# Patient Record
Sex: Female | Born: 1950 | Race: White | Hispanic: No | Smoking: Never smoker
Health system: Southern US, Community
[De-identification: ages and names within clinical notes are randomized; demographics above are authoritative.]

## PROBLEM LIST (undated history)

## (undated) DIAGNOSIS — I2510 Atherosclerotic heart disease of native coronary artery without angina pectoris: Secondary | ICD-10-CM

## (undated) DIAGNOSIS — E119 Type 2 diabetes mellitus without complications: Secondary | ICD-10-CM

## (undated) DIAGNOSIS — C801 Malignant (primary) neoplasm, unspecified: Secondary | ICD-10-CM

## (undated) DIAGNOSIS — I10 Essential (primary) hypertension: Secondary | ICD-10-CM

## (undated) DIAGNOSIS — I639 Cerebral infarction, unspecified: Secondary | ICD-10-CM

## (undated) HISTORY — PX: ABDOMINAL HYSTERECTOMY: SHX81

---

## 2006-08-04 ENCOUNTER — Emergency Department: Payer: Self-pay | Admitting: Emergency Medicine

## 2006-08-04 IMAGING — CR DG ELBOW COMPLETE 3+V*L*
1 series · 4 of 4 positions shown · non-contrast
Comparison: none

REASON FOR EXAM: Injury
COMMENTS:

PROCEDURE:     DXR - DXR ELBOW LT COMP W/OBLIQUES  - [DATE]  [DATE]
RESULT:     Multiple views reveal no acute fractures or dislocations. The
joint space is intact.

[Series 1: view not recorded · 0.17mm/px · 4 of 4 slices shown]
[im 1/4]
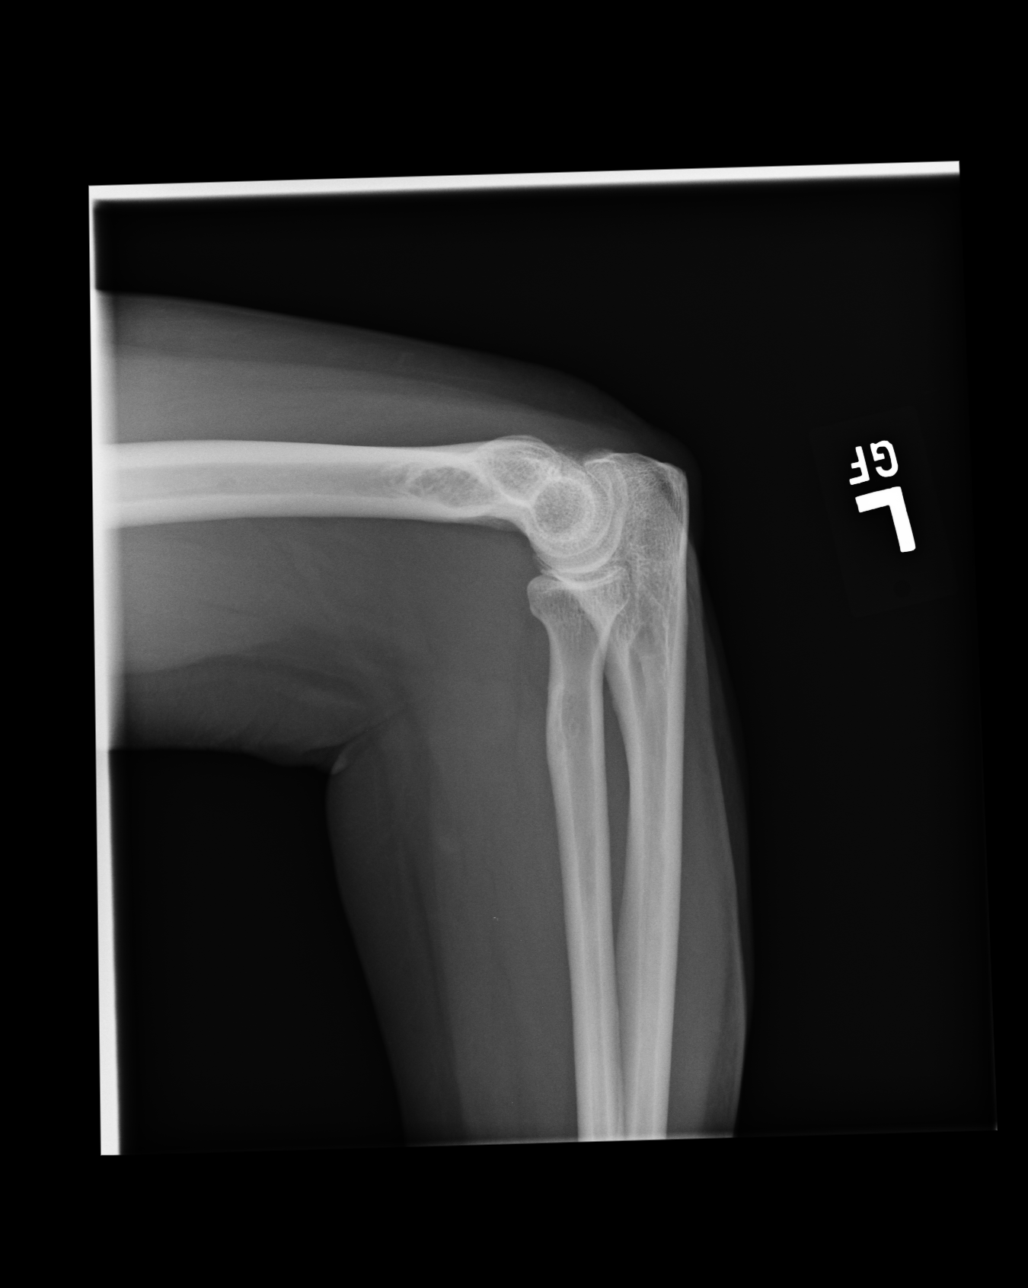
[im 2/4]
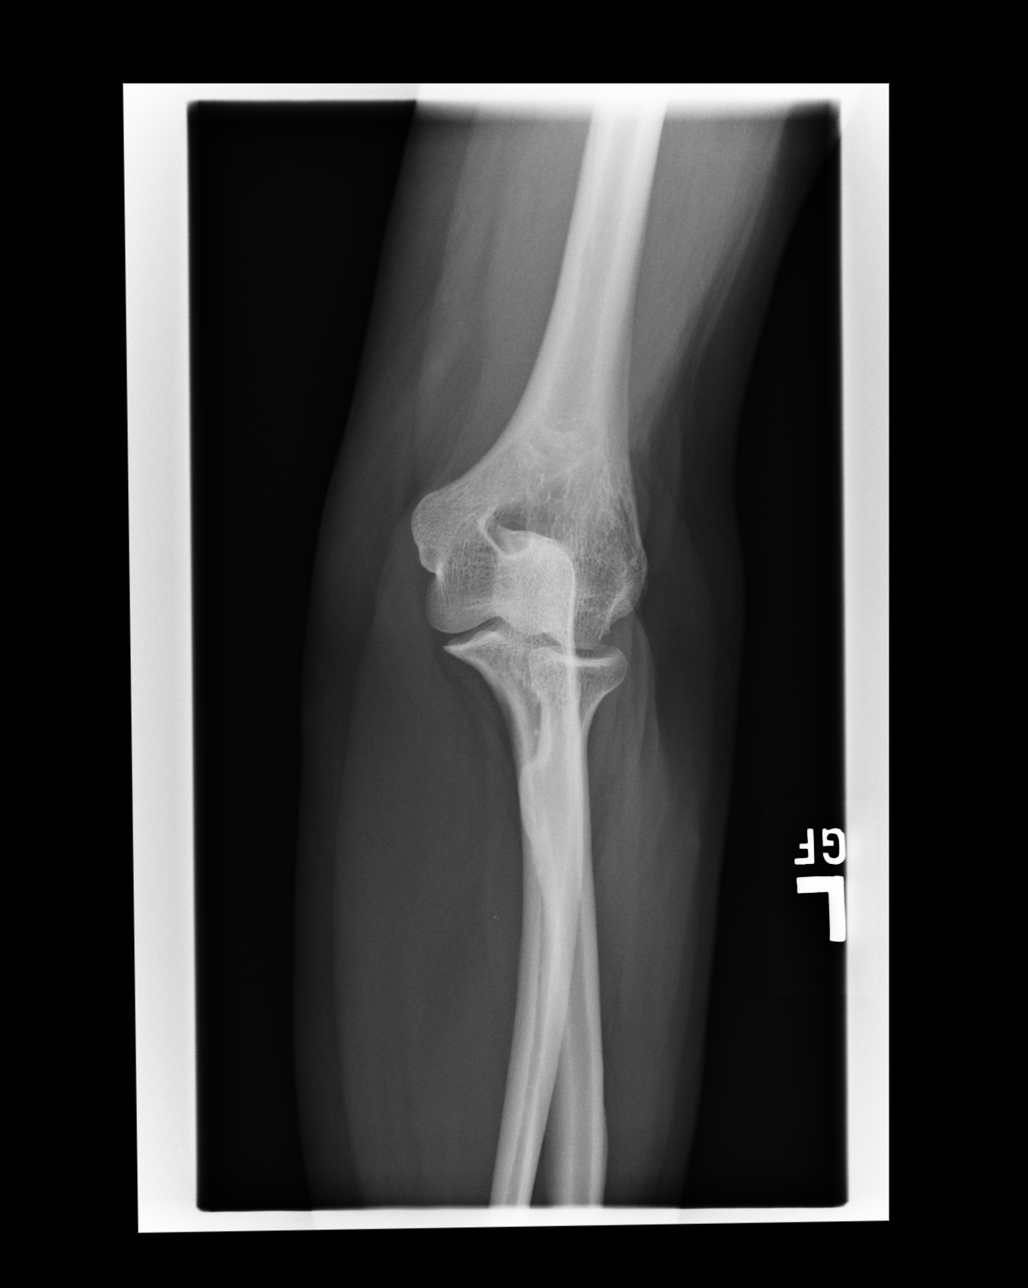
[im 3/4]
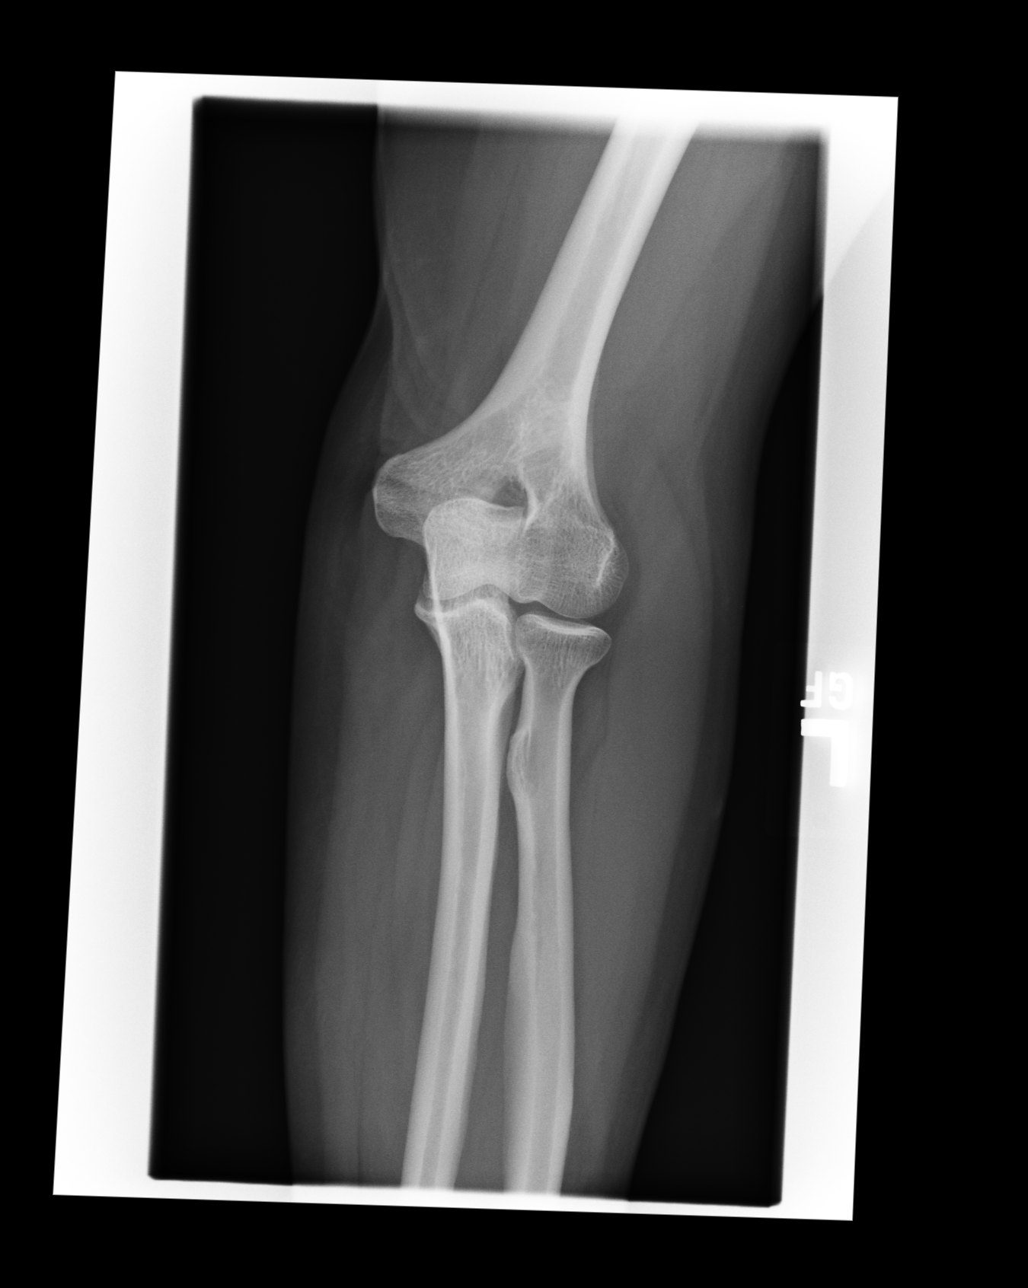
[im 4/4]
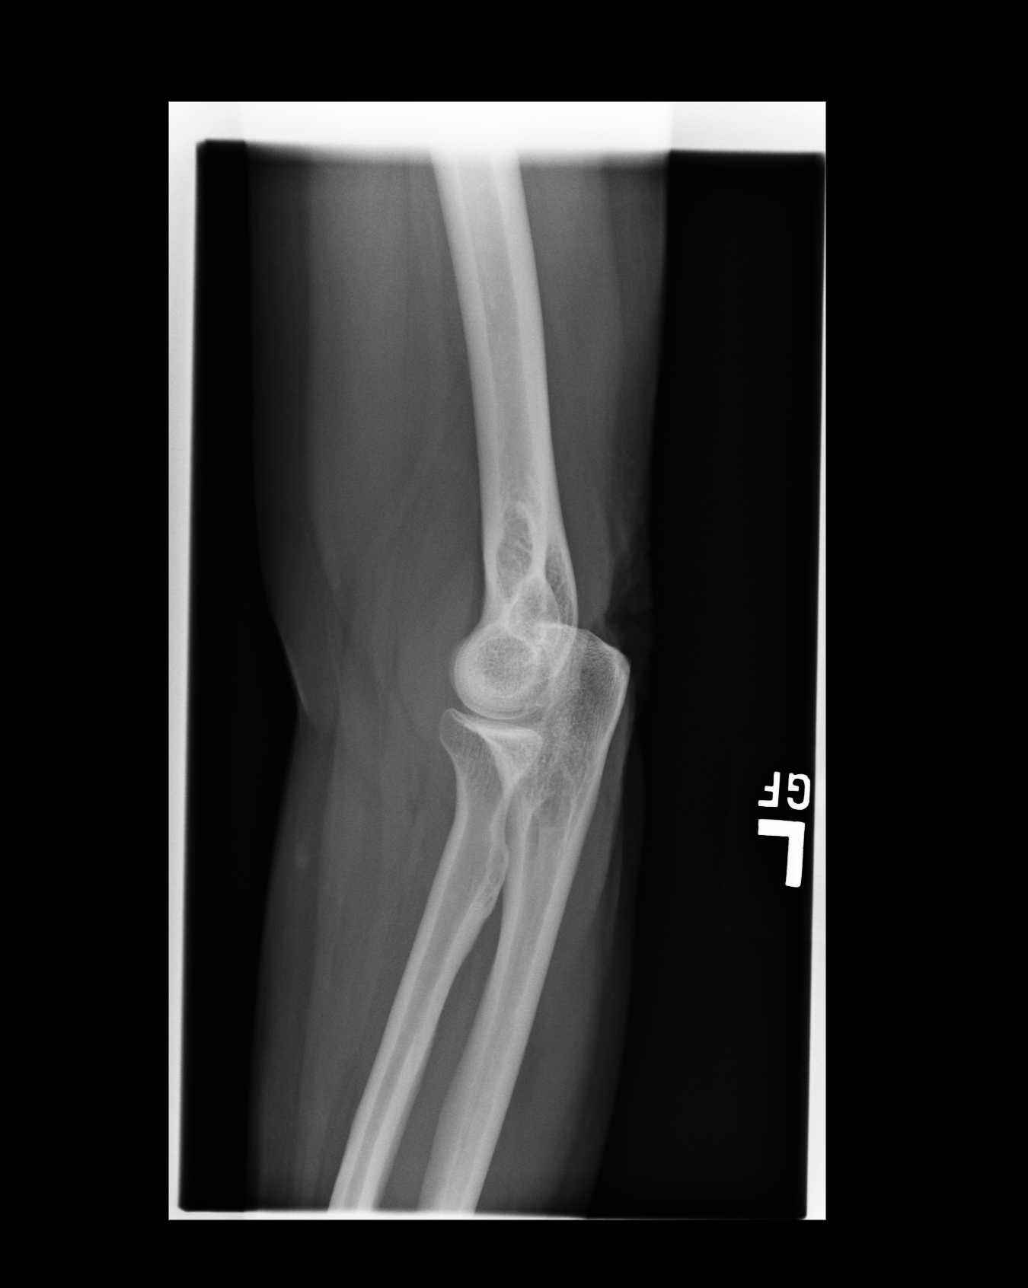

[4 of 4 positions shown; findings below may reference images not displayed]

IMPRESSION: No acute fracture is seen of the LEFT elbow.

## 2008-01-09 ENCOUNTER — Ambulatory Visit: Payer: Self-pay

## 2009-03-18 ENCOUNTER — Ambulatory Visit: Payer: Self-pay

## 2009-03-18 IMAGING — CR RIGHT HIP - COMPLETE 2+ VIEW
1 series · 2 of 2 positions shown · non-contrast
Comparison: none

REASON FOR EXAM: hip problems DDS DR RAZAZ  fax [REDACTED]
COMMENTS:

PROCEDURE:     DXR - DXR HIP RIGHT COMPLETE  - [DATE] [DATE]
RESULT:     Images of the right hip demonstrate no fracture, dislocation or
radiopaque foreign body.

[Series 1: view not recorded · 0.17mm/px · 2 of 2 slices shown]
[im 1/2]
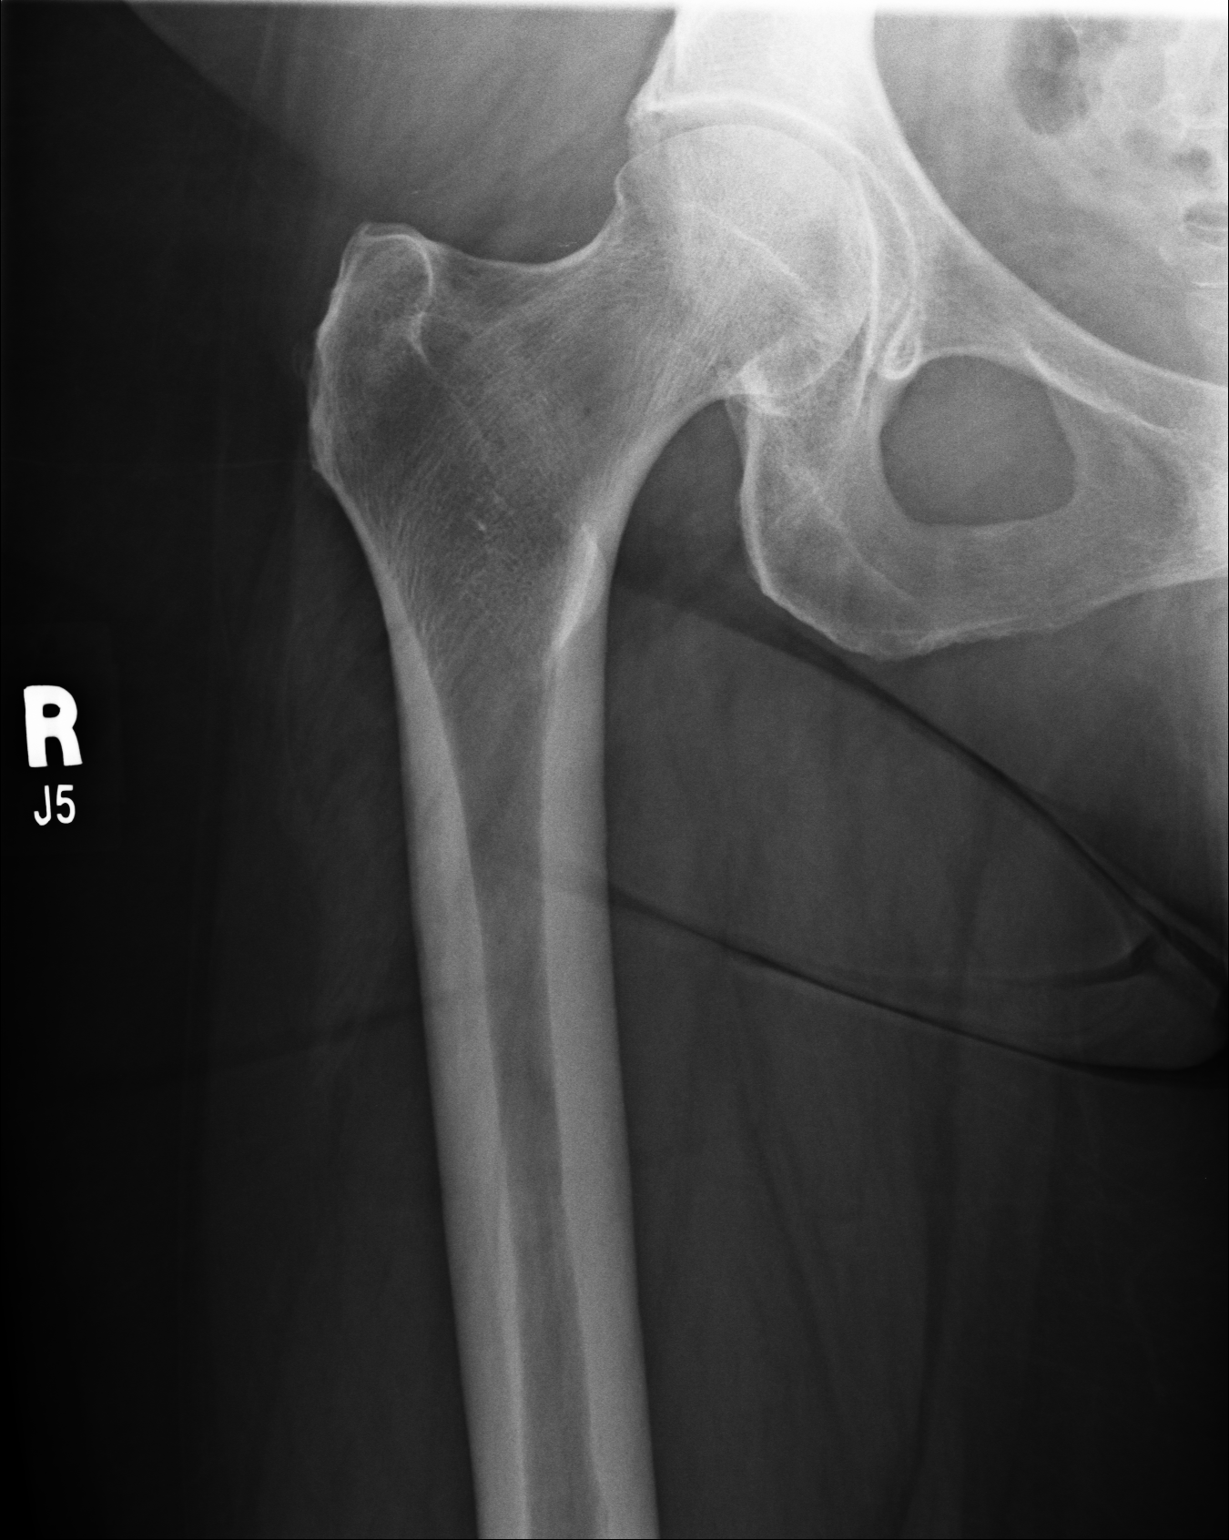
[im 2/2]
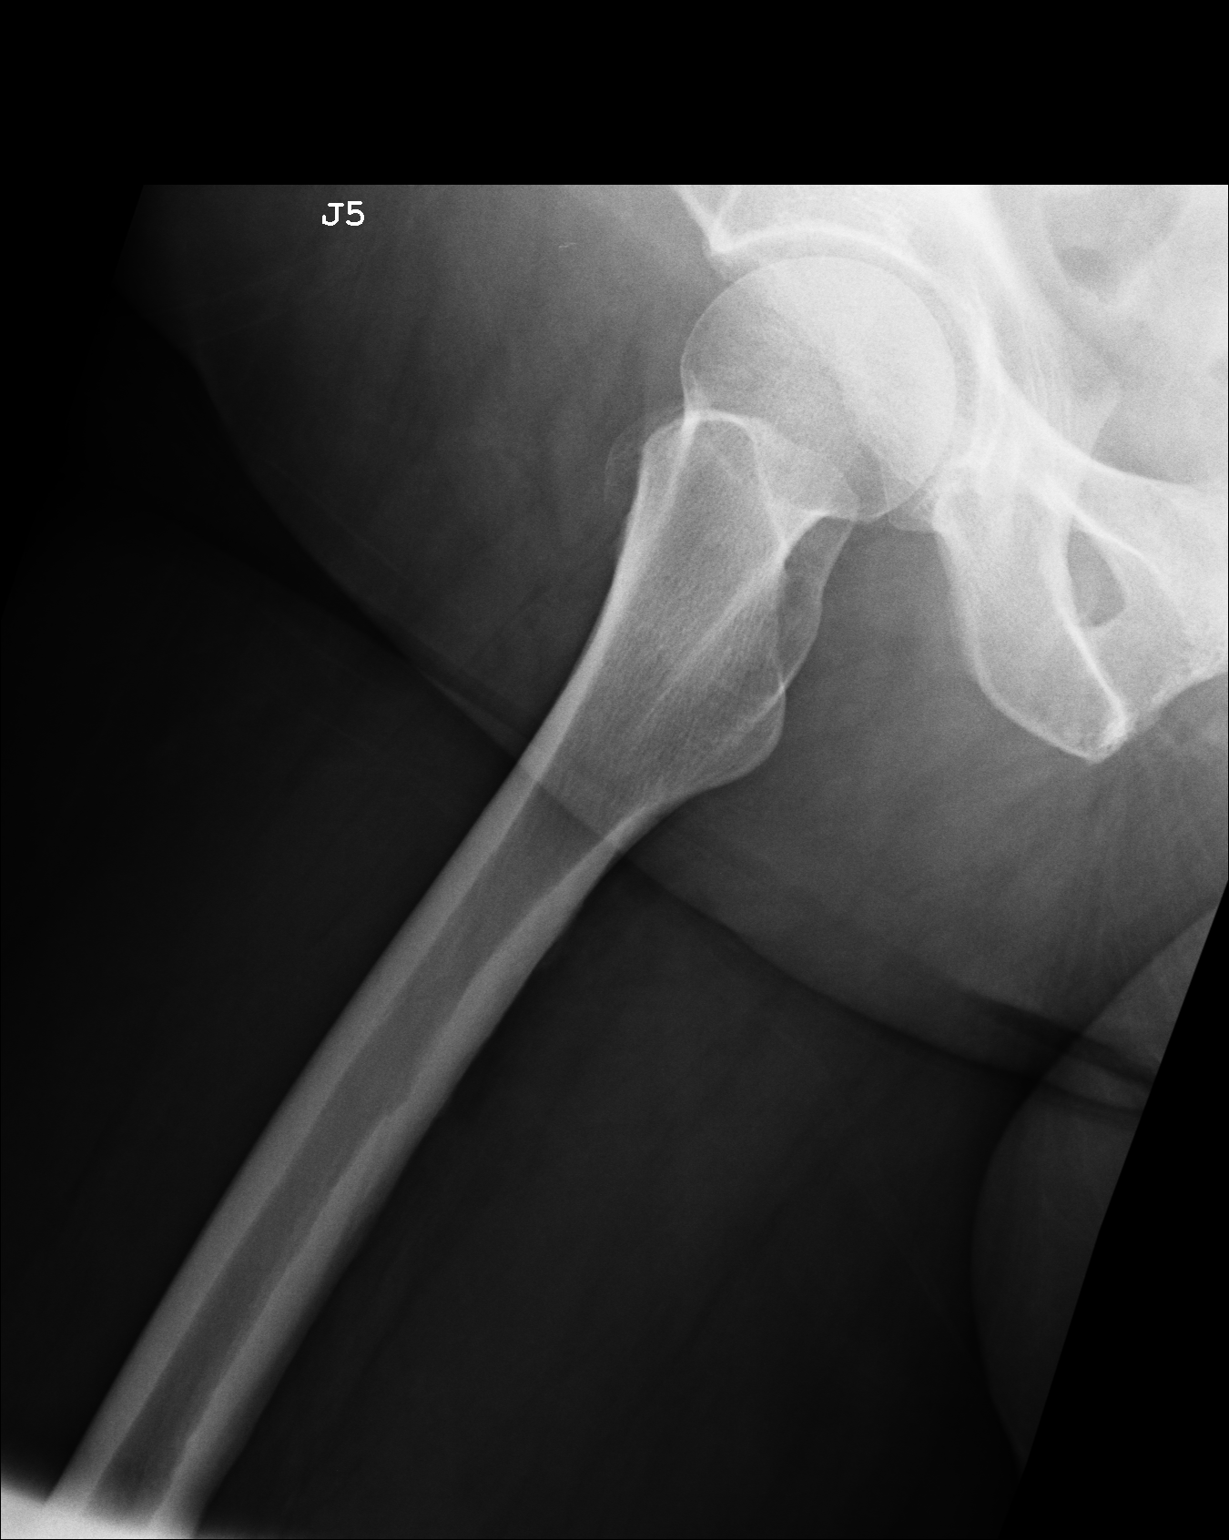

[2 of 2 positions shown; findings below may reference images not displayed]

IMPRESSION: Please see above.

## 2010-02-11 ENCOUNTER — Emergency Department: Payer: Self-pay | Admitting: Unknown Physician Specialty

## 2010-02-11 IMAGING — CR DG CHEST 2V
1 series · 2 of 2 positions shown · non-contrast
Comparison: none

REASON FOR EXAM: right chest pain
COMMENTS:   May transport without cardiac monitor

[Series 1: view not recorded · 0.17mm/px · 2 of 2 slices shown]
[im 1/2]
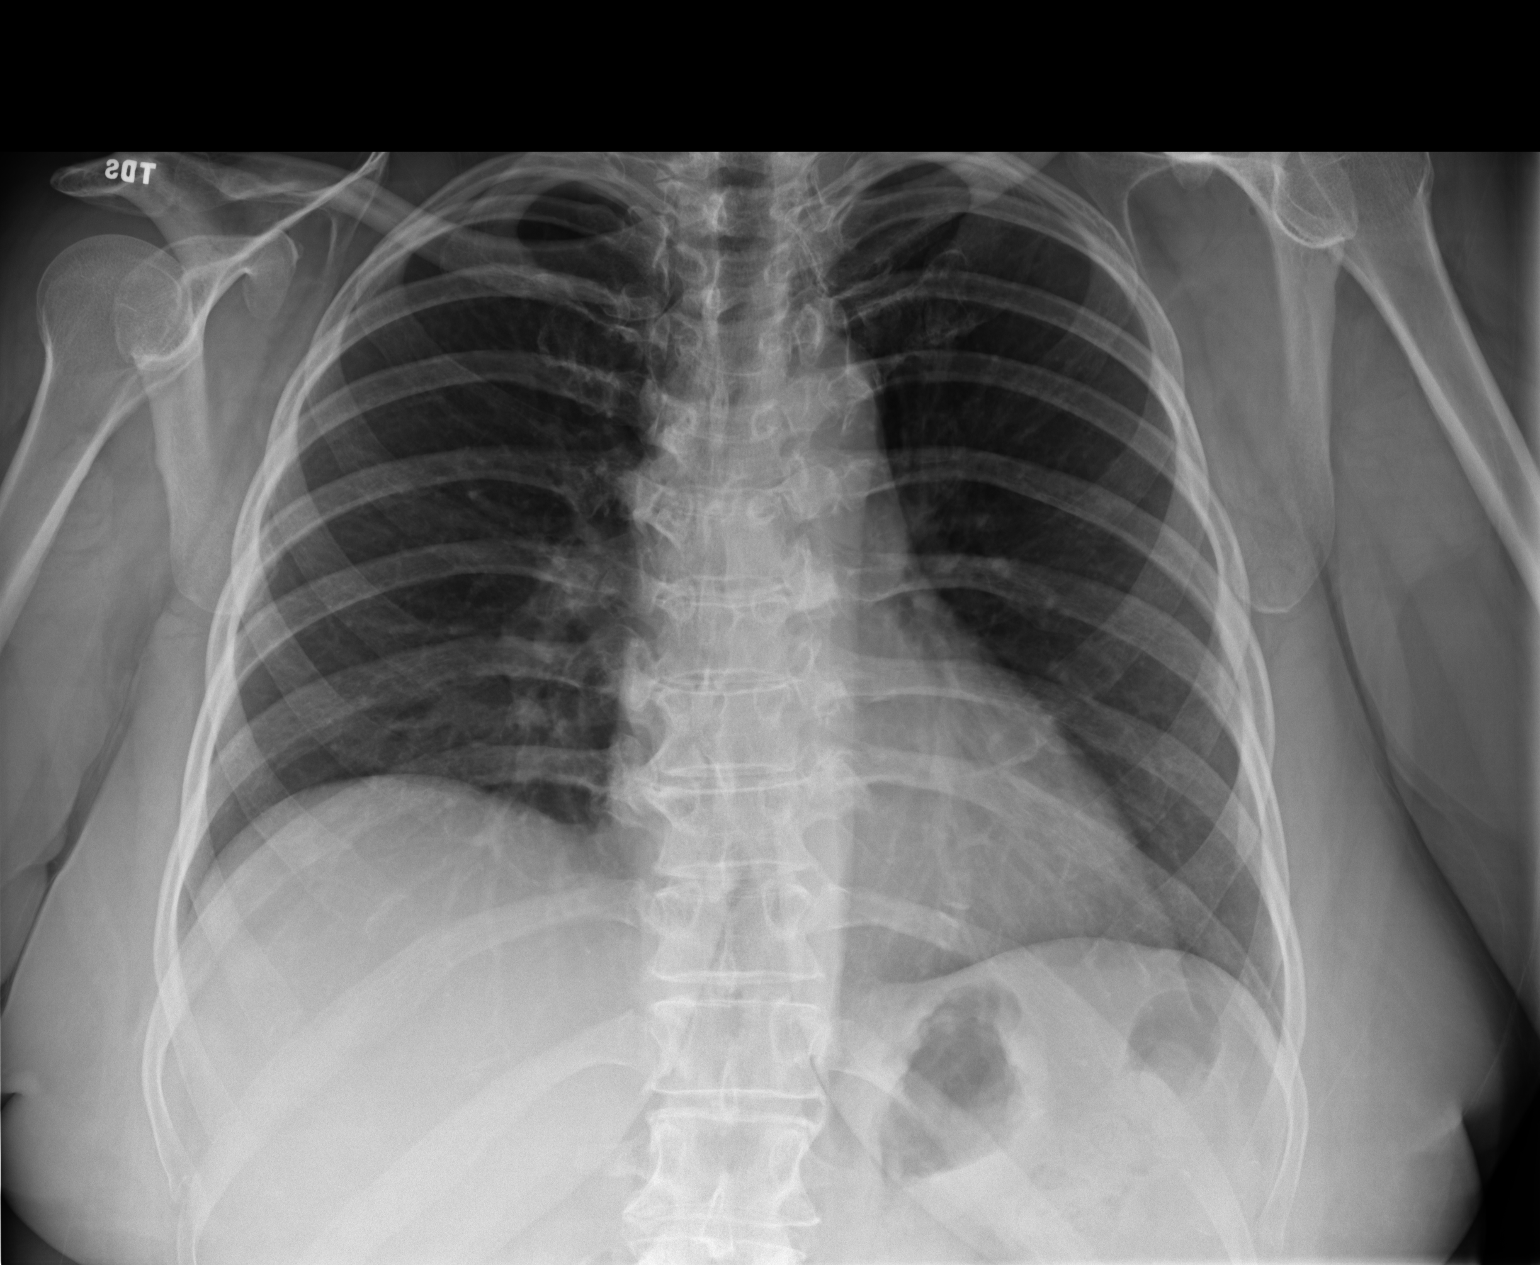
[im 2/2]
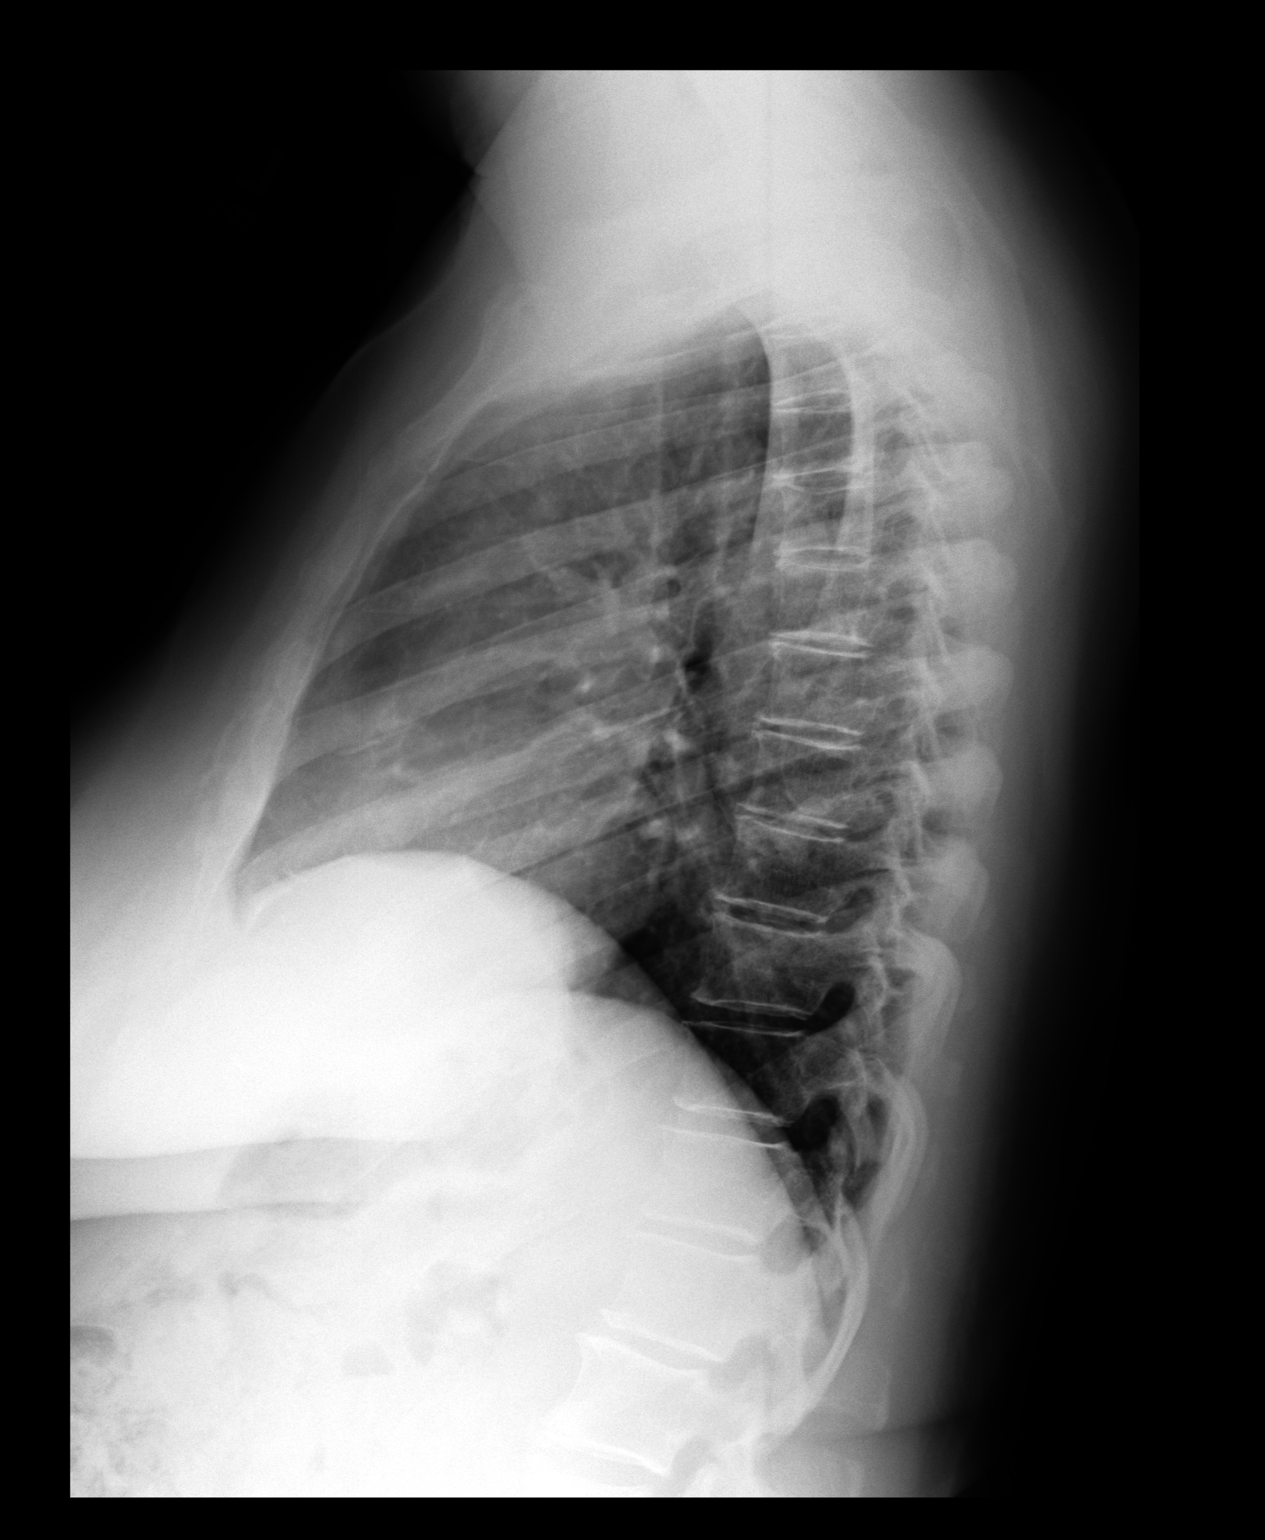

[2 of 2 positions shown; findings below may reference images not displayed]

PROCEDURE:     DXR - DXR CHEST PA (OR AP) AND LATERAL  - [DATE]  [DATE]

RESULT:     The lungs are adequately inflated. There is no focal infiltrate.
The cardiac silhouette is normal in size. The pulmonary vascularity is not
engorged. There is no pleural effusion. The trachea is midline. The
mediastinum is not widened. The observed portions of the thoracic spine
appear normal.
IMPRESSION: I do not see evidence of acute cardiopulmonary abnormality.

## 2010-11-13 ENCOUNTER — Emergency Department: Payer: Self-pay | Admitting: Emergency Medicine

## 2010-11-13 IMAGING — CR RIGHT FOOT COMPLETE - 3+ VIEW
1 series · 3 of 3 positions shown · non-contrast
Comparison: none

REASON FOR EXAM: pain
COMMENTS:

PROCEDURE:     DXR - DXR FOOT RT COMPLETE W/OBLIQUES  - [DATE]  [DATE]
RESULT:     Comparison: None.

[Series 1: view not recorded · 0.17mm/px · 3 of 3 slices shown]
[im 1/3]
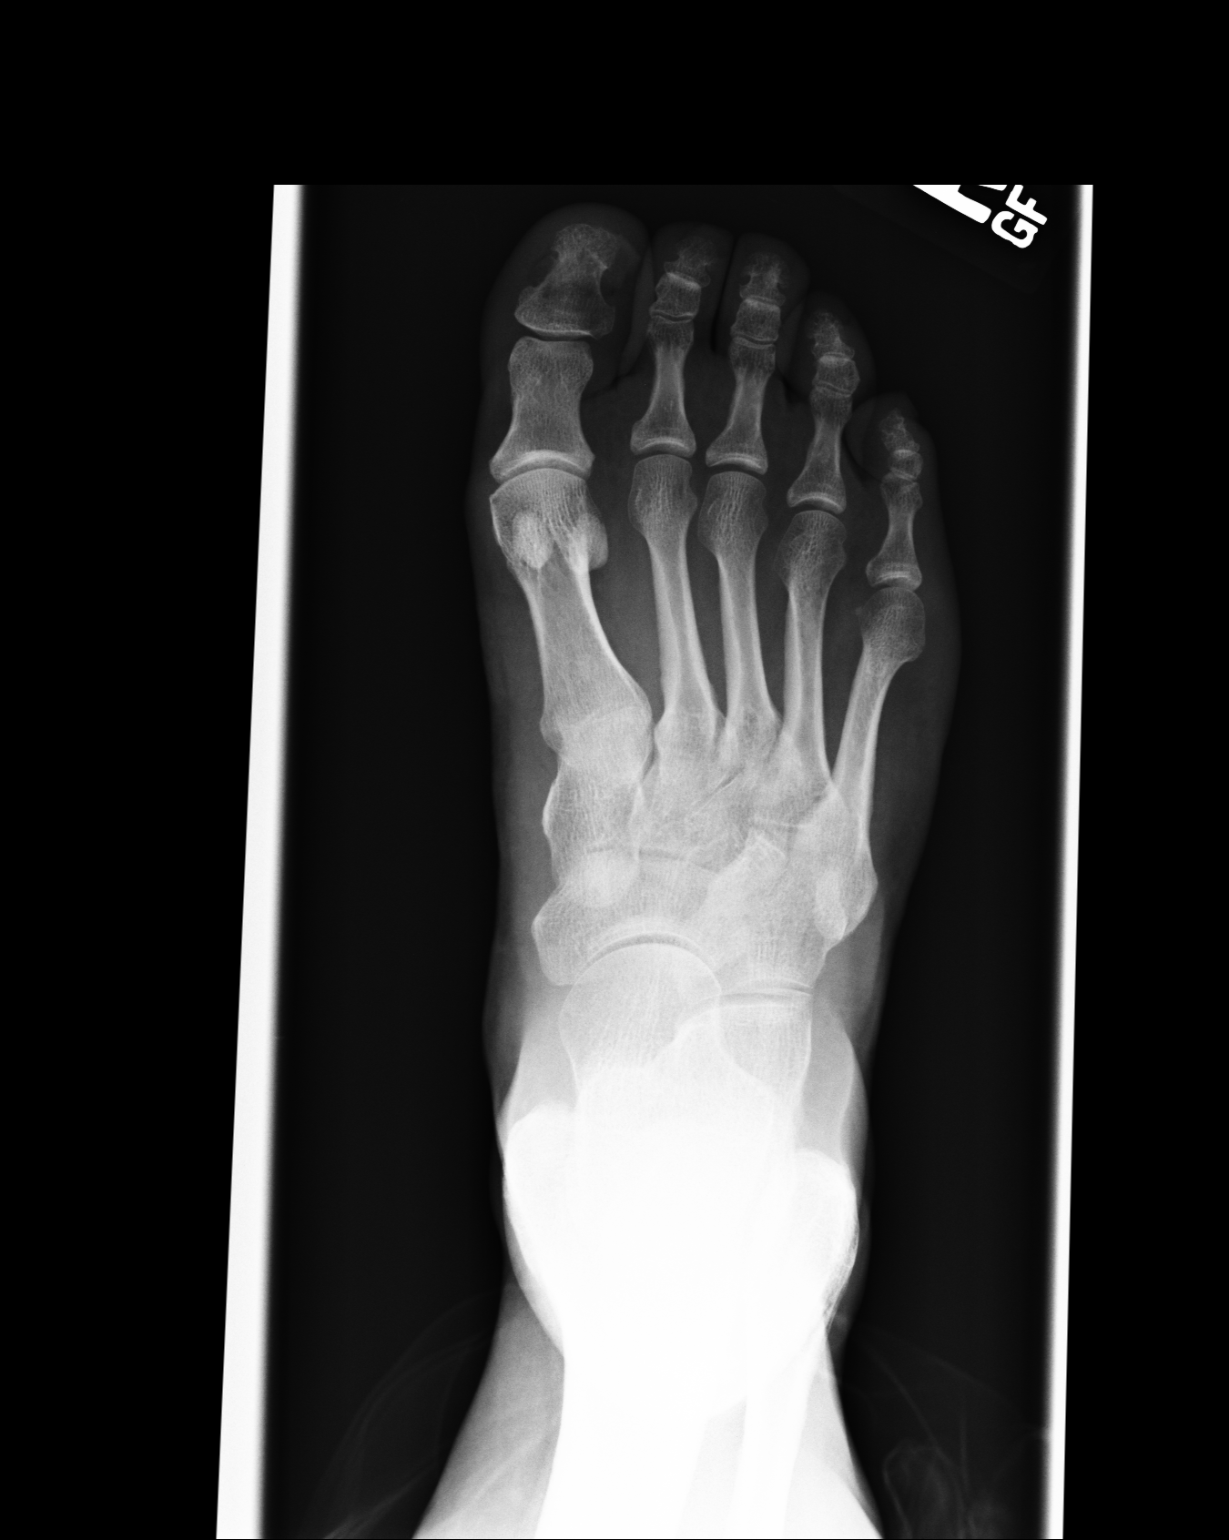
[im 2/3]
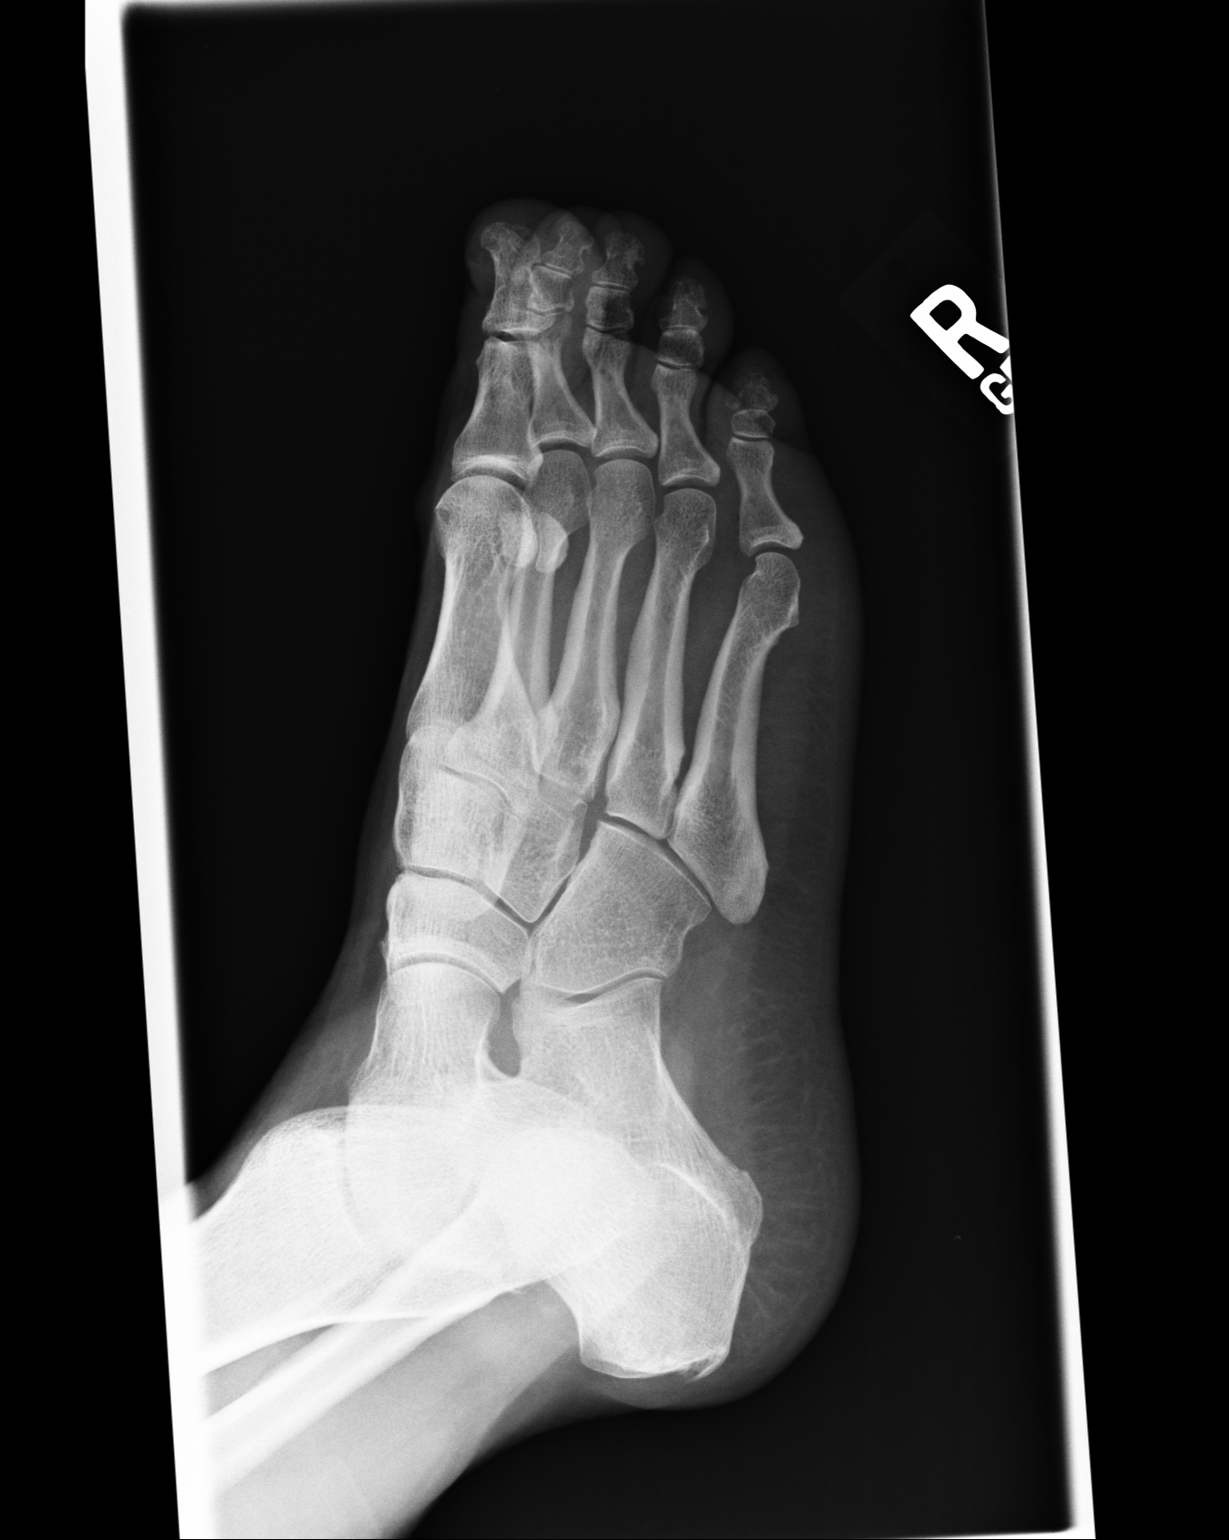
[im 3/3]
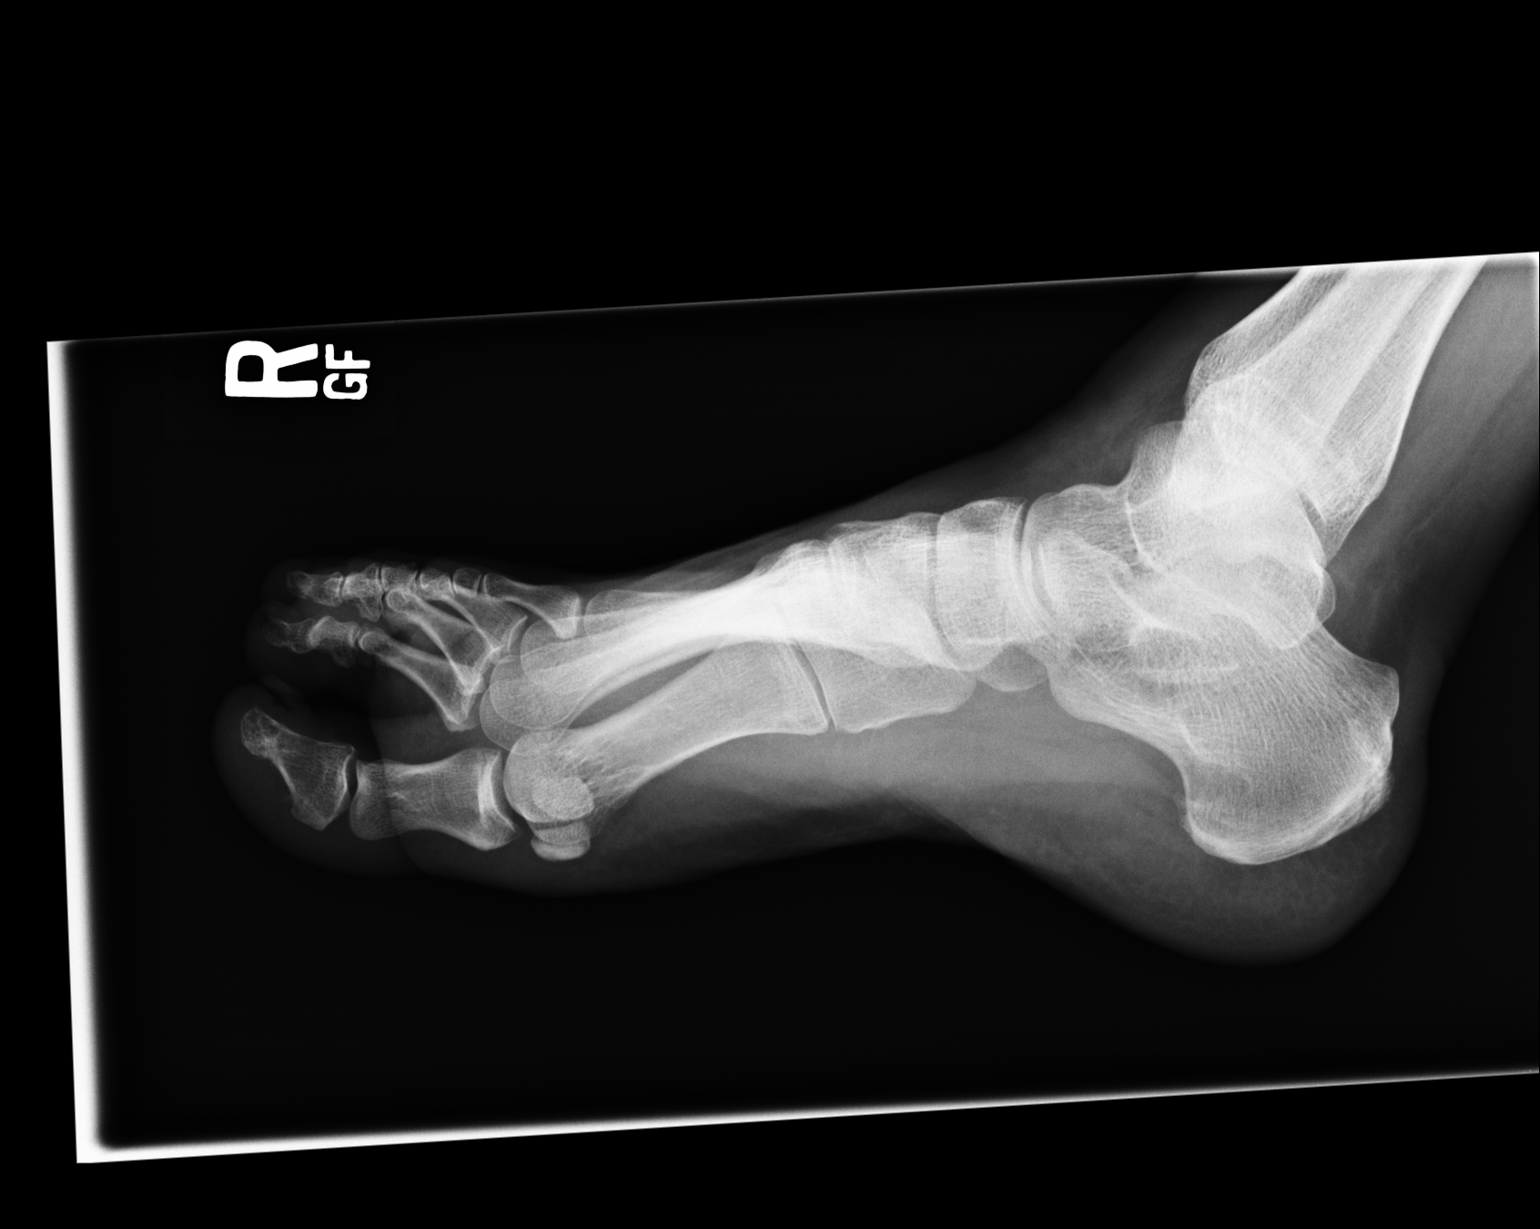

[3 of 3 positions shown; findings below may reference images not displayed]

FINDINGS: No acute fracture. Normal alignment. Joint spaces are maintained.
IMPRESSION: No acute findings.

## 2010-12-11 ENCOUNTER — Emergency Department: Payer: Self-pay | Admitting: Emergency Medicine

## 2010-12-22 ENCOUNTER — Ambulatory Visit: Payer: Self-pay | Admitting: Family Medicine

## 2010-12-22 IMAGING — NM NUCLEAR MEDICINE THREE PHASE BONE SCAN
3 series · 12 of 12 positions shown · non-contrast
Comparison: none

REASON FOR EXAM: right foot injury attn fifth metatarsal
COMMENTS:

[Series 1000: immediate · 4.80mm/px · 2 of 2 frames shown]
[frame 1/2]
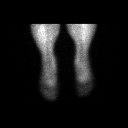
[frame 2/2]
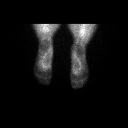

[Series 1000: flow · 4.80mm/px · 6 of 60 frames shown]
[frame 6/60  full-range]
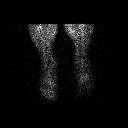
[frame 16/60  full-range]
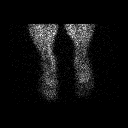
[frame 26/60  full-range]
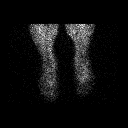
[frame 36/60  full-range]
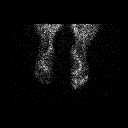
[frame 46/60  full-range]
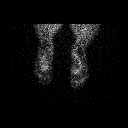
[frame 56/60  full-range]
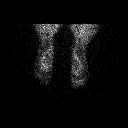

[Series 1000: delays · 4.80mm/px · 2 acquisitions, 4 frames shown]
[im 1/2]
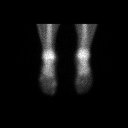
[im 1/2]
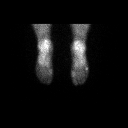
[im 2/2]
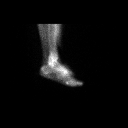
[im 2/2]
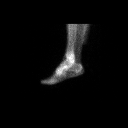

[12 of 12 positions shown; findings below may reference images not displayed]

PROCEDURE:     KNM - KNM 3 PHASE BONE IMAGING STUDY  - [DATE]  [DATE]

RESULT:     Following intravenous administration of 24.0 mCi technetium 99m
MDP, Three Phase Bone Scan of the feet was performed. On the early vascular
phase, in the equilibrium phase views, no abnormal tracer activity is seen
in the feet. Specifically, no abnormal tracer activity is seen in the region
of the right, fifth metatarsal.

On the delayed scan at 3 hours, there is a slight increase in tracer
activity at the level of the right, fifth MP joint. In view of the absence
of hyperemia on the early vascular and equilibrium phase views, this finding
is unlikely related to infection or to acute fracture or to stress fracture.
Arthritic change would be the primary consideration in the etiology at this
point.
IMPRESSION: On the delayed scan there is a tiny focus of increased
tracer activity in the region of the distal right, fifth metatarsal. The
finding is not specific but does not made the criteria for infection, acute
fracture or stress fracture. Arthritic change would be the first
consideration although note is made that review of the right foot radiograph
of [DATE] does not reveal arthritic change at this slight. The etiology
is; therefore, indeterminate by this study.

## 2015-02-02 ENCOUNTER — Ambulatory Visit: Payer: Self-pay | Admitting: Family Medicine

## 2015-08-09 DIAGNOSIS — I10 Essential (primary) hypertension: Secondary | ICD-10-CM | POA: Diagnosis present

## 2016-11-06 ENCOUNTER — Other Ambulatory Visit: Payer: Self-pay | Admitting: Family Medicine

## 2016-11-06 DIAGNOSIS — Z1239 Encounter for other screening for malignant neoplasm of breast: Secondary | ICD-10-CM

## 2017-09-13 ENCOUNTER — Encounter: Payer: Self-pay | Admitting: Emergency Medicine

## 2017-09-13 ENCOUNTER — Emergency Department
Admission: EM | Admit: 2017-09-13 | Discharge: 2017-09-13 | Disposition: A | Discharge status: Home / Self Care | Payer: Medicare Other | Attending: Emergency Medicine | Admitting: Emergency Medicine

## 2017-09-13 ENCOUNTER — Emergency Department: Payer: Medicare Other

## 2017-09-13 DIAGNOSIS — I2510 Atherosclerotic heart disease of native coronary artery without angina pectoris: Secondary | ICD-10-CM | POA: Diagnosis not present

## 2017-09-13 DIAGNOSIS — Z8673 Personal history of transient ischemic attack (TIA), and cerebral infarction without residual deficits: Secondary | ICD-10-CM | POA: Insufficient documentation

## 2017-09-13 DIAGNOSIS — E119 Type 2 diabetes mellitus without complications: Secondary | ICD-10-CM | POA: Diagnosis not present

## 2017-09-13 DIAGNOSIS — G4459 Other complicated headache syndrome: Secondary | ICD-10-CM | POA: Diagnosis not present

## 2017-09-13 DIAGNOSIS — I10 Essential (primary) hypertension: Secondary | ICD-10-CM | POA: Diagnosis not present

## 2017-09-13 DIAGNOSIS — R51 Headache: Secondary | ICD-10-CM | POA: Diagnosis present

## 2017-09-13 HISTORY — DX: Atherosclerotic heart disease of native coronary artery without angina pectoris: I25.10

## 2017-09-13 HISTORY — DX: Malignant (primary) neoplasm, unspecified: C80.1

## 2017-09-13 HISTORY — DX: Essential (primary) hypertension: I10

## 2017-09-13 HISTORY — DX: Cerebral infarction, unspecified: I63.9

## 2017-09-13 HISTORY — DX: Type 2 diabetes mellitus without complications: E11.9

## 2017-09-13 IMAGING — CT CT HEAD W/O CM
3 series · 16 of 44 positions shown, 19 images · non-contrast
Comparison: None.

CLINICAL DATA: 65 y/o  F; acute severe headache.

EXAM:
CT HEAD WITHOUT CONTRAST
TECHNIQUE: Contiguous axial images were obtained from the base of the skull
through the vertex without intravenous contrast.

[Series 3: head wo · axial · 0.40mm/px · z∈[-82,+28]mm · 10 of 27 slices shown, 13 images]
[im 3/27  brain]
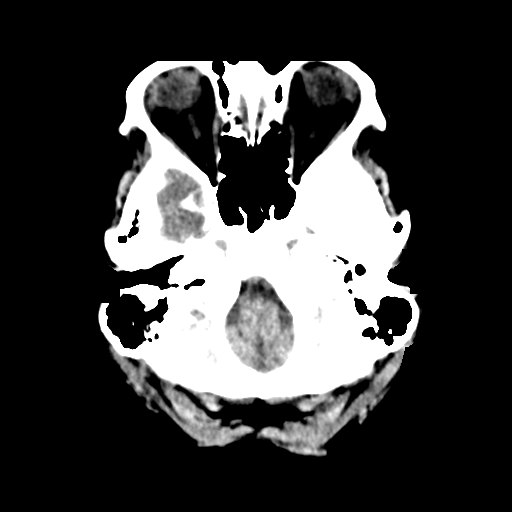
[im 3/27  bone]
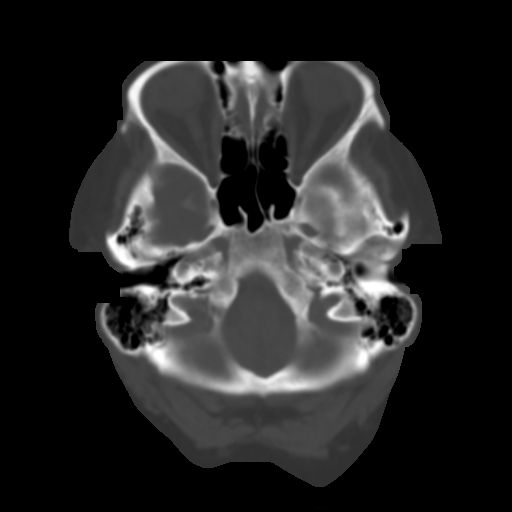
[im 5/27  brain]
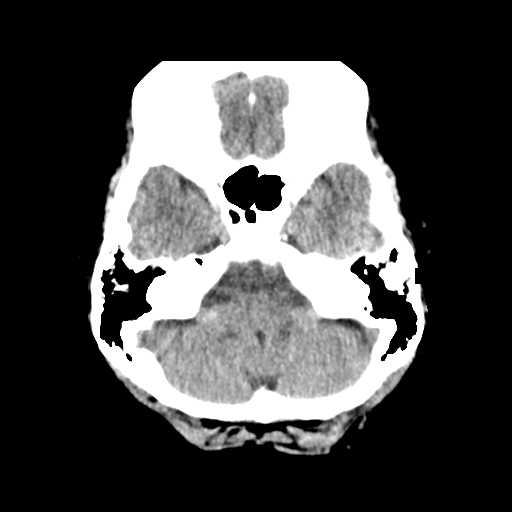
[im 8/27  brain]
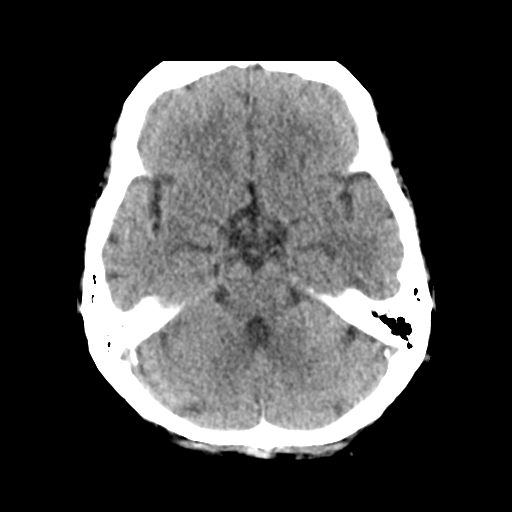
[im 10/27  brain]
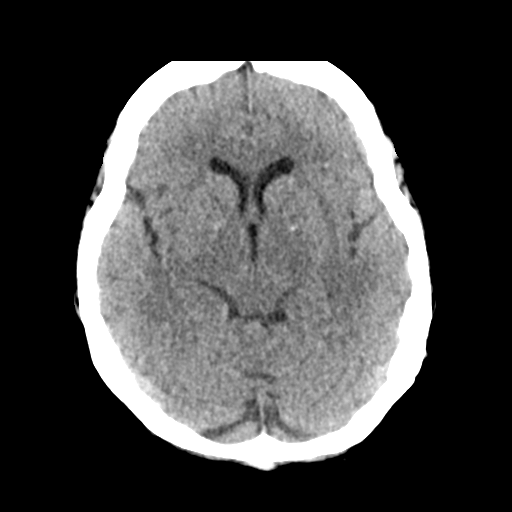
[im 13/27  brain]
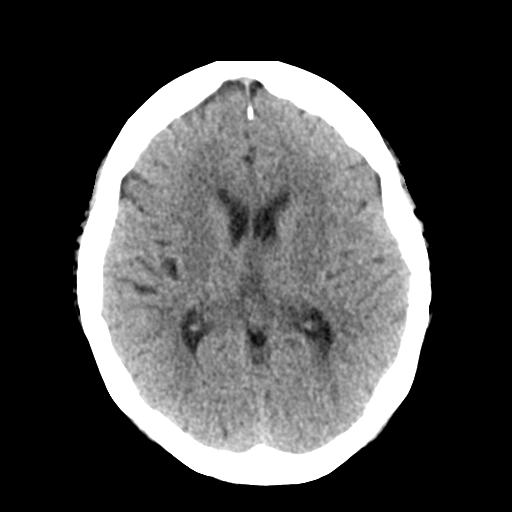
[im 13/27  bone]
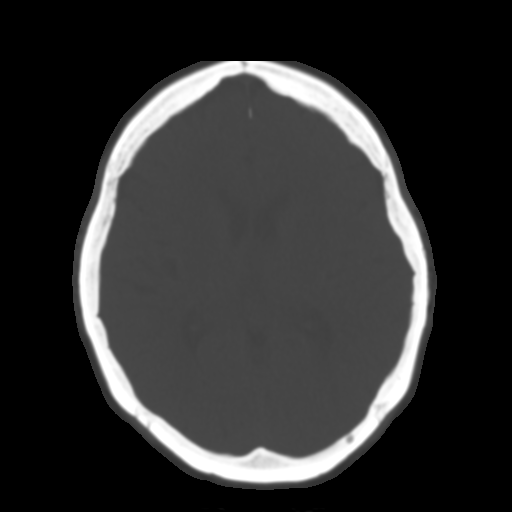
[im 15/27  brain]
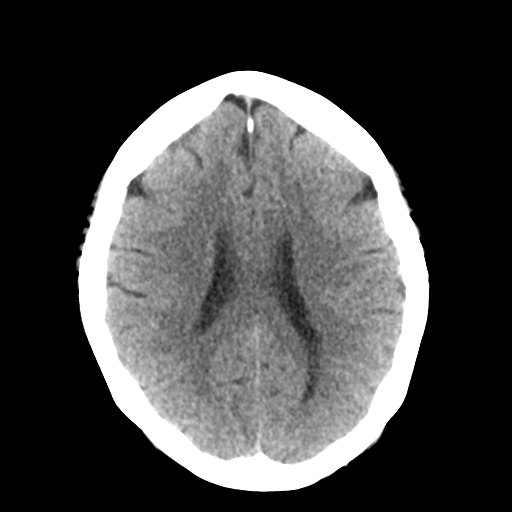
[im 18/27  brain]
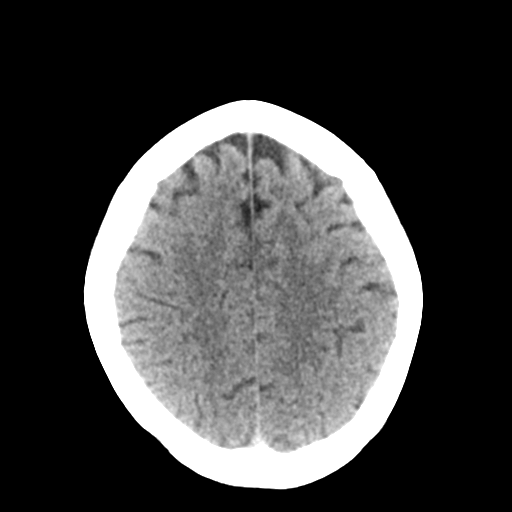
[im 20/27  brain]
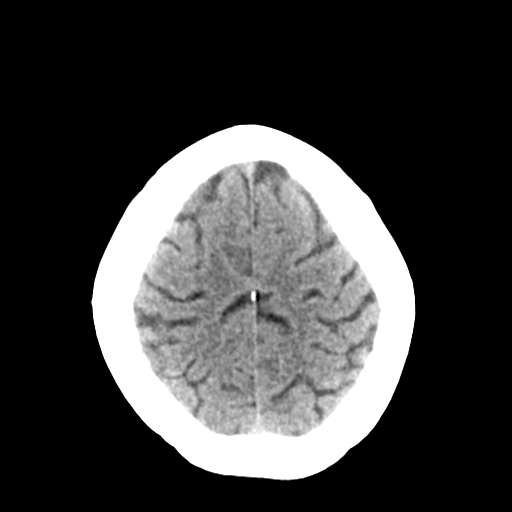
[im 23/27  brain]
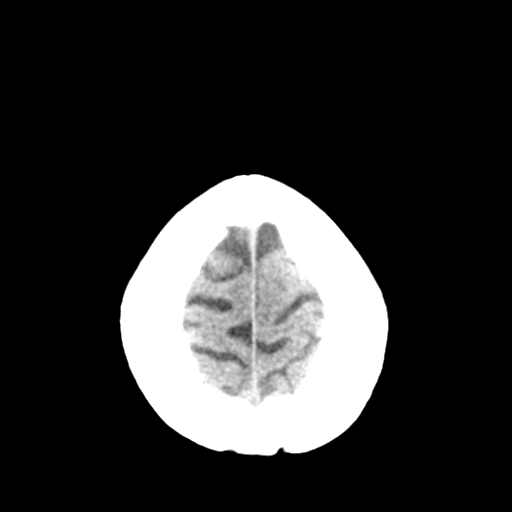
[im 23/27  bone]
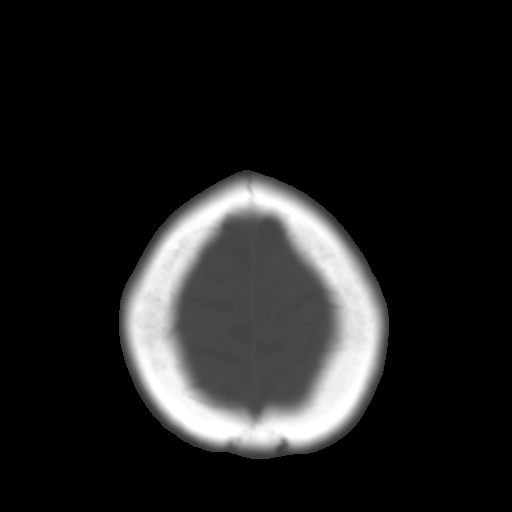
[im 25/27  brain]
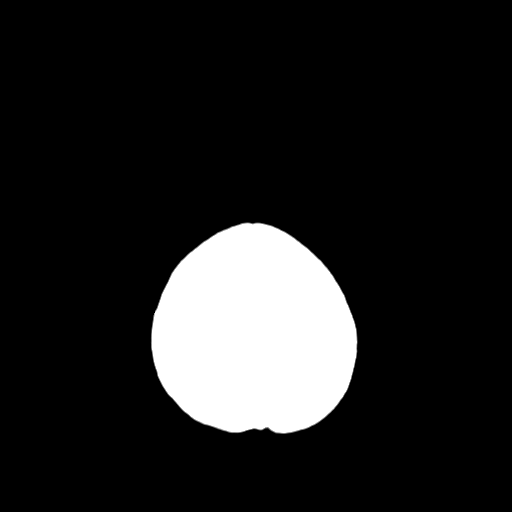

[Series 4: coronal soft tissue · coronal · 0.29mm/px · 3 of 53 slices shown]
[im 18/53  brain]
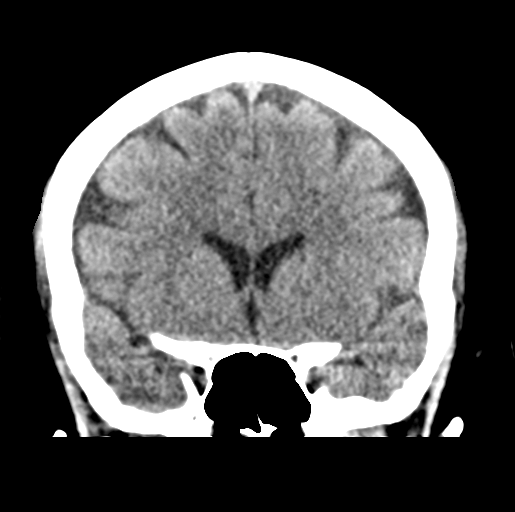
[im 24/53  brain]
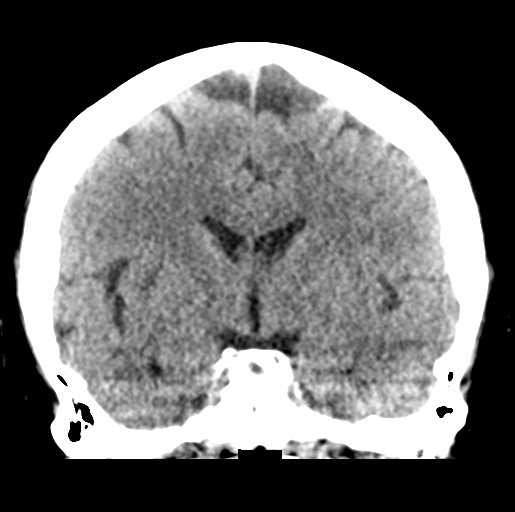
[im 29/53  brain]
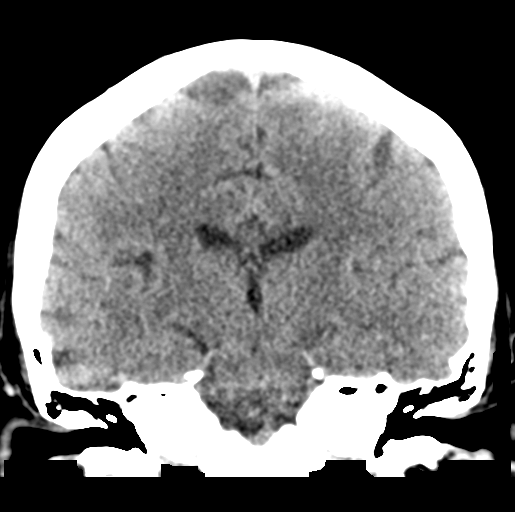

[Series 5: sagittal soft tissue · sagittal · 0.28mm/px · 3 of 48 slices shown]
[im 16/48  brain]
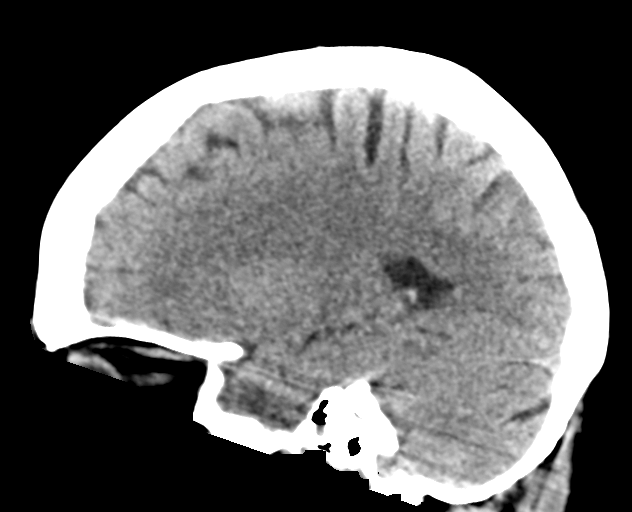
[im 24/48  brain]
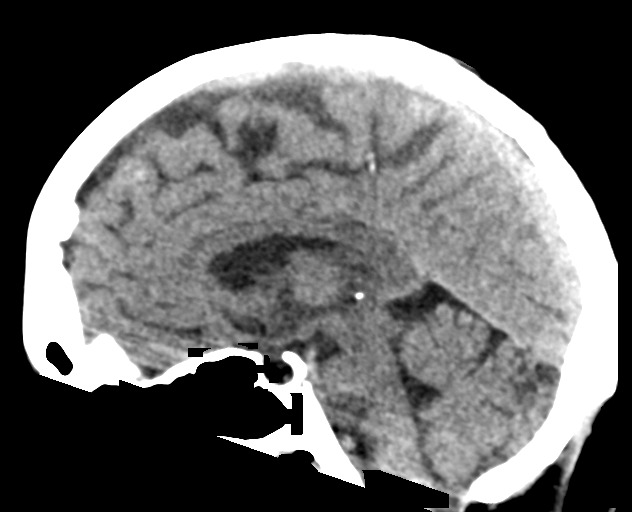
[im 32/48  brain]
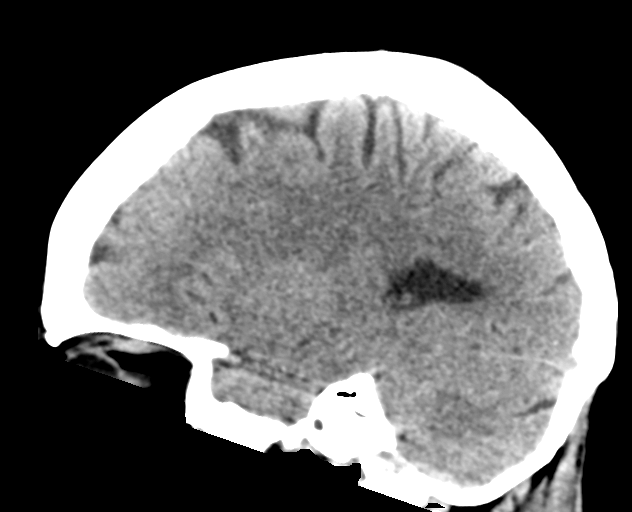

[16 of 44 positions shown; findings below may reference images not displayed]

FINDINGS: Brain: No evidence of acute infarction, hemorrhage, hydrocephalus,
extra-axial collection or mass lesion/mass effect.

Vascular: Mild calcific atherosclerosis of carotid siphons. No
hyperdense vessel identified.

Skull: Normal. Negative for fracture or focal lesion.

Sinuses/Orbits: No acute finding.

Other: None.
IMPRESSION: No acute intracranial abnormality. Unremarkable CT of the head for
age.

By: BANCA LOS LANDY M.D.

## 2017-09-13 MED ORDER — BUTALBITAL-APAP-CAFFEINE 50-325-40 MG PO TABS: 1.0000 | ORAL_TABLET | Freq: Four times a day (QID) | ORAL | 0 refills | Status: AC | PRN

## 2017-09-13 NOTE — ED Triage Notes (Signed)
Pt arrives POV to triage with c/o headache x almost two years. Pt states that her PCP Delight Stare keeps telling her to take tylenol. Per pt, "I'm tared of taking timenol". Pt is ambulatory to triage and states that she took her BP medication x 3 hours ago. Pt is in NAD at this time.

## 2017-09-13 NOTE — ED Provider Notes (Signed)
[facility] Emergency Department Provider Note       Time seen: ----------------------------------------- 9:47 PM on 09/13/2017 -----------------------------------------     I have reviewed the triage vital signs and the nursing notes.   HISTORY   Chief Complaint Headache    HPI Madison Davidson is a 66 y.o. female with a history of hypertension who presents to the ED for headache. Patient states she's had a daily headache for months to years that her doctor keeps telling her to take Tylenol for. Patient states she started taking Tylenol and she is requesting some further evaluation for these headaches. Patient reports she is taking all of her medications as prescribed. Pain is right-sided, nothing makes it better. She states light does bother her sometimes.  Past Medical History:  Diagnosis Date  . Cancer ([place])    skin  . Coronary artery disease   . Diabetes mellitus without complication ([place])   . Hypertension   . Stroke [facility])     There are no active problems to display for this patient.   Past Surgical History:  Procedure Laterality Date  . ABDOMINAL HYSTERECTOMY      Allergies Patient has no known allergies.  Social History Social History  Substance Use Topics  . Smoking status: Never Smoker  . Smokeless tobacco: Never Used  . Alcohol use No    Review of Systems Constitutional: Negative for fever. Eyes: Negative for vision changes ENT:  Negative for congestion, sore throat Cardiovascular: Negative for chest pain. Respiratory: Negative for shortness of breath. Gastrointestinal: Negative for abdominal pain, vomiting and diarrhea. Genitourinary: Negative for dysuria. Musculoskeletal: Negative for back pain. Skin: Negative for rash. Neurological: positive for headache  All systems negative/normal/unremarkable except as stated in the HPI  ____________________________________________   PHYSICAL EXAM:  VITAL SIGNS: ED Triage  Vitals  Enc Vitals Group     BP 09/13/17 2104 (!) 192/66     Pulse Rate 09/13/17 2104 71     Resp 09/13/17 2104 18     Temp 09/13/17 2104 98 F (36.7 C)     Temp Source 09/13/17 2104 Oral     SpO2 09/13/17 2104 97 %     Weight 09/13/17 2107 185 lb (83.9 kg)     Height 09/13/17 2107 5\' 2"  (1.575 m)     Head Circumference --      Peak Flow --      Pain Score 09/13/17 2103 10     Pain Loc --      Pain Edu? --      Excl. in [place]? --     Constitutional: Alert and oriented. Well appearing and in no distress. Eyes: Conjunctivae are normal. Normal extraocular movements. ENT   Head: Normocephalic and atraumatic.   Nose: No congestion/rhinnorhea.   Mouth/Throat: Mucous membranes are moist.   Neck: No stridor. Cardiovascular: Normal rate, regular rhythm. No murmurs, rubs, or gallops. Respiratory: Normal respiratory effort without tachypnea nor retractions. Breath sounds are clear and equal bilaterally. No wheezes/rales/rhonchi. Gastrointestinal: Soft and nontender. Normal bowel sounds Musculoskeletal: Nontender with normal range of motion in extremities. No lower extremity tenderness nor edema. Neurologic:  Normal speech and language. No gross focal neurologic deficits are appreciated. strength, sensation, cranial nerves are intact Skin:  Skin is warm, dry and intact. No rash noted. Psychiatric: Mood and affect are normal. Speech and behavior are normal.  ____________________________________________  ED COURSE:  Pertinent labs & imaging results that were available during my care of the patient were reviewed  by me and considered in my medical decision making (see chart for details). Patient presents for headaches that are chronic, we will assess with imaging as indicated.   Procedures ____________________________________________   RADIOLOGY Images were viewed by me  CT head IMPRESSION: No acute intracranial abnormality. Unremarkable CT of the head  for age. ____________________________________________  DIFFERENTIAL DIAGNOSIS   tension headache, migraine, brain tumor, anxiety, hypertension   FINAL ASSESSMENT AND PLAN  chronic headaches   Plan: Patient had presented for chronic headaches. Patients labs have been performed recently as an outpatient according to her and were negative. Patients imaging was negative for any acute process. I will prescribe some Fioricet she can try for her headache. Otherwise she'll be referred to neurology for outpatient follow-up.   Earleen Newport, MD   Note: This note was generated in part or whole with voice recognition software. Voice recognition is usually quite accurate but there are transcription errors that can and very often do occur. I apologize for any typographical errors that were not detected and corrected.     Earleen Newport, MD 09/13/17 2227

## 2018-04-28 ENCOUNTER — Encounter (INDEPENDENT_AMBULATORY_CARE_PROVIDER_SITE_OTHER): Payer: Self-pay

## 2018-04-28 ENCOUNTER — Ambulatory Visit: Payer: Medicare Other | Admitting: Pharmacist

## 2018-04-28 ENCOUNTER — Other Ambulatory Visit: Payer: Self-pay

## 2018-04-28 VITALS — BP 140/70 | Wt 190.0 lb

## 2018-04-28 DIAGNOSIS — Z79899 Other long term (current) drug therapy: Secondary | ICD-10-CM

## 2018-04-28 NOTE — Patient Instructions (Addendum)
Check with Dr. Lennox Grumbles about loratadine (Claritin) instead of fexofenadine (Allegra) to avoid drowsiness.   Ask Dr. Lennox Grumbles about the difference in blood pressure between your arms.  Ask Dr. Caryl Pina about an alternative to nortriptyline as this is making you very sleepy.   Watch salt intake especially when you eat out.

## 2018-04-28 NOTE — Progress Notes (Signed)
Medication Management Clinic Visit Note  Patient: Madison Davidson MRN: [id number] Date of Birth: 1951/07/17 PCP: [facility] [place] A Seiter 67 y.o. female presents for a medication review today. She feels that several of her medications are causing her to be drowsy. She also states that her blood pressure is up and down and that her blood sugar readings have been elevated.  BP 140/70 (BP Location: Left Arm, Patient Position: Sitting, Cuff Size: Large)   Patient Information   Past Medical History:  Diagnosis Date  . Cancer ([place])    skin  . Coronary artery disease   . Diabetes mellitus without complication ([place])   . Hypertension   . Stroke [facility])       Past Surgical History:  Procedure Laterality Date  . ABDOMINAL HYSTERECTOMY      No family history on file.  New Diagnoses (since last visit):   Family Support: Good  Lifestyle Diet: Brk: eggs with cheese, tomato, bacon or weenie, water, chocolate milk occasionally Lunch: tacos or [employer] chicken, green beans Dinner: snack or tuna, or cheese cracker with milk, or sardines with crackers            Social History   Substance and Sexual Activity  Alcohol Use No      Social History   Tobacco Use  Smoking Status Never Smoker  Smokeless Tobacco Never Used      Health Maintenance  Topic Date Due  . Hepatitis C Screening  12-26-1950  . TETANUS/TDAP  11/13/1968  . MAMMOGRAM  11/14/1999  . COLONOSCOPY  11/14/1999  . PNA vac Low Risk Adult (1 of 2 - PCV13) 11/13/2014  . INFLUENZA VACCINE  06/26/2018  . DEXA SCAN  Completed   Outpatient Encounter Medications as of 04/28/2018  Medication Sig  . aspirin EC 81 MG tablet Take 81 mg by mouth as needed.  Marland Kitchen atorvastatin (LIPITOR) 40 MG tablet Take 40 mg by mouth daily. Takes around 4pm  . butalbital-acetaminophen-caffeine (FIORICET, ESGIC) 50-325-40 MG tablet Take 1-2 tablets by mouth every 6 (six) hours as needed for headache.  . celecoxib  (CELEBREX) 200 MG capsule Take 200 mg by mouth daily.  . citalopram (CELEXA) 10 MG tablet Take 10 mg by mouth daily.  Marland Kitchen diltiazem (DILT-XR) 120 MG 24 hr capsule Take 120 mg by mouth daily.  . fexofenadine (ALLEGRA) 180 MG tablet Take 180 mg by mouth daily. Takes 1/2 tablet daily  . glipiZIDE (GLUCOTROL) 10 MG tablet Take 10 mg by mouth 2 (two) times daily.  Marland Kitchen levothyroxine (SYNTHROID, LEVOTHROID) 100 MCG tablet Take 100 mcg by mouth daily.  . metFORMIN (GLUCOPHAGE) 500 MG tablet Take 1,000 mg by mouth 2 (two) times daily with a meal.  . nortriptyline (PAMELOR) 10 MG capsule Take 10 mg by mouth at bedtime.   . saxagliptin HCl (ONGLYZA) 5 MG TABS tablet Take 5 mg by mouth daily.   No facility-administered encounter medications on file as of 04/28/2018.     Assessment and Plan:  Compliance: Patient states that she is too drowsy if she takes her citalopram daily. She is not tolerating the nortriptyline either. Only takes aspirin as needed. Patient states that she tried to use a pill box in the past and forgot to take her medications. She keeps her pill bottles in a small lunch tote.  Diabetes: Taking metformin, glipizide and Onglyza. Patient was on insulin in the past and has worked hard to come off of the injections. She is not open  to a GLP-1 agonist at this time. Has issues with yeast infections, would not recommend a SGLT2 inhibitor. Morning blood sugars are around 145 mg/dl and 180-200 mg/dl before bedtime. Per patient, the last A1c was close to 10%.  Headaches: states that she is only able to tolerate nortriptyline 10mg  at bedtime and could not tolerate the 20mg  due to the side effects. She feels "hung over" the next morning with 20mg . She also states that the nortriptyline does not help much for the headache prevention. She is also using butalbital/apap/caffeine as needed. Only takes 1 tablet every two weeks. Suggested she discuss this with the NP, Caryl Pina at her next visit.   Mood: taking  citalopram for her mood. However, she takes this as needed. Recommended she take this daily as prescribed to assist with her mood and for increased efficacy. Again, states this makes her too drowsy. She takes it at night and is afraid that she won't be able to hear the fire alarm if it goes off.  Blood Pressure: 128/62 right arm, 140/70 left arm. Patient states her BP varies throughout the day and between her arms. She has a friend at her complex that frequently monitors her BP with a wrist monitor. Reviewed her diet and recommended that she be mindful of the salt especially at [facility].   Allergies: taking fexofenadine 180mg - 1/2 tablet daily up to twice daily. She cannot tolerate the full tablet as this makes her drowsy. Suggested a trial of loratadine.    [place] Severs K. Dicky Doe, PharmD [facility] Clinic-Pharmacy Operations Coordinator [phone]      BP 200's Dr. Lennox Grumbles next week. Checks blood sugar 2x/day (fasting and before bed) Today 145 mg/dl in the morning; evening 180-200.

## 2018-06-27 ENCOUNTER — Other Ambulatory Visit: Payer: Self-pay | Admitting: Nurse Practitioner

## 2018-06-27 DIAGNOSIS — Z13820 Encounter for screening for osteoporosis: Secondary | ICD-10-CM

## 2018-07-29 ENCOUNTER — Other Ambulatory Visit: Payer: Medicare Other

## 2018-08-07 ENCOUNTER — Ambulatory Visit
Admission: RE | Admit: 2018-08-07 | Discharge: 2018-08-07 | Disposition: A | Discharge status: Home / Self Care | Payer: Medicare Other | Source: Ambulatory Visit | Attending: Nurse Practitioner | Admitting: Nurse Practitioner

## 2018-08-07 DIAGNOSIS — Z780 Asymptomatic menopausal state: Secondary | ICD-10-CM | POA: Diagnosis not present

## 2018-08-07 DIAGNOSIS — E039 Hypothyroidism, unspecified: Secondary | ICD-10-CM | POA: Insufficient documentation

## 2018-08-07 DIAGNOSIS — E119 Type 2 diabetes mellitus without complications: Secondary | ICD-10-CM | POA: Diagnosis not present

## 2018-08-07 DIAGNOSIS — Z13820 Encounter for screening for osteoporosis: Secondary | ICD-10-CM

## 2018-08-07 DIAGNOSIS — Z7984 Long term (current) use of oral hypoglycemic drugs: Secondary | ICD-10-CM | POA: Insufficient documentation

## 2018-08-07 DIAGNOSIS — Z79890 Hormone replacement therapy (postmenopausal): Secondary | ICD-10-CM | POA: Diagnosis not present

## 2020-02-18 ENCOUNTER — Other Ambulatory Visit: Payer: Self-pay | Admitting: Internal Medicine

## 2020-02-18 DIAGNOSIS — I208 Other forms of angina pectoris: Secondary | ICD-10-CM

## 2020-02-22 ENCOUNTER — Other Ambulatory Visit: Payer: Self-pay | Admitting: Internal Medicine

## 2020-02-22 DIAGNOSIS — I208 Other forms of angina pectoris: Secondary | ICD-10-CM

## 2020-03-08 ENCOUNTER — Ambulatory Visit: Payer: Medicare Other

## 2021-05-08 ENCOUNTER — Emergency Department: Payer: Medicare Other

## 2021-05-08 ENCOUNTER — Inpatient Hospital Stay
Admission: EM | Admit: 2021-05-08 | Discharge: 2021-05-10 | DRG: 065 | Disposition: A | Discharge status: Other Acute Inpatient Hospital | Payer: Medicare Other | Attending: Internal Medicine | Admitting: Internal Medicine

## 2021-05-08 ENCOUNTER — Inpatient Hospital Stay: Payer: Medicare Other

## 2021-05-08 ENCOUNTER — Other Ambulatory Visit: Payer: Self-pay

## 2021-05-08 DIAGNOSIS — Z79890 Hormone replacement therapy (postmenopausal): Secondary | ICD-10-CM

## 2021-05-08 DIAGNOSIS — E669 Obesity, unspecified: Secondary | ICD-10-CM | POA: Diagnosis present

## 2021-05-08 DIAGNOSIS — R4781 Slurred speech: Secondary | ICD-10-CM | POA: Diagnosis present

## 2021-05-08 DIAGNOSIS — I63511 Cerebral infarction due to unspecified occlusion or stenosis of right middle cerebral artery: Secondary | ICD-10-CM | POA: Diagnosis present

## 2021-05-08 DIAGNOSIS — I161 Hypertensive emergency: Secondary | ICD-10-CM | POA: Diagnosis not present

## 2021-05-08 DIAGNOSIS — Z90710 Acquired absence of both cervix and uterus: Secondary | ICD-10-CM

## 2021-05-08 DIAGNOSIS — I6389 Other cerebral infarction: Secondary | ICD-10-CM | POA: Diagnosis not present

## 2021-05-08 DIAGNOSIS — I639 Cerebral infarction, unspecified: Secondary | ICD-10-CM | POA: Diagnosis present

## 2021-05-08 DIAGNOSIS — N179 Acute kidney failure, unspecified: Secondary | ICD-10-CM | POA: Diagnosis present

## 2021-05-08 DIAGNOSIS — G8929 Other chronic pain: Secondary | ICD-10-CM | POA: Diagnosis present

## 2021-05-08 DIAGNOSIS — R29810 Facial weakness: Secondary | ICD-10-CM | POA: Diagnosis present

## 2021-05-08 DIAGNOSIS — I672 Cerebral atherosclerosis: Secondary | ICD-10-CM | POA: Diagnosis present

## 2021-05-08 DIAGNOSIS — I082 Rheumatic disorders of both aortic and tricuspid valves: Secondary | ICD-10-CM | POA: Diagnosis present

## 2021-05-08 DIAGNOSIS — W19XXXA Unspecified fall, initial encounter: Secondary | ICD-10-CM | POA: Diagnosis present

## 2021-05-08 DIAGNOSIS — E782 Mixed hyperlipidemia: Secondary | ICD-10-CM | POA: Diagnosis present

## 2021-05-08 DIAGNOSIS — Z20822 Contact with and (suspected) exposure to covid-19: Secondary | ICD-10-CM | POA: Diagnosis present

## 2021-05-08 DIAGNOSIS — F32A Depression, unspecified: Secondary | ICD-10-CM | POA: Diagnosis present

## 2021-05-08 DIAGNOSIS — Z8673 Personal history of transient ischemic attack (TIA), and cerebral infarction without residual deficits: Secondary | ICD-10-CM | POA: Diagnosis not present

## 2021-05-08 DIAGNOSIS — Z7982 Long term (current) use of aspirin: Secondary | ICD-10-CM | POA: Diagnosis not present

## 2021-05-08 DIAGNOSIS — Z6832 Body mass index (BMI) 32.0-32.9, adult: Secondary | ICD-10-CM

## 2021-05-08 DIAGNOSIS — D638 Anemia in other chronic diseases classified elsewhere: Secondary | ICD-10-CM | POA: Diagnosis present

## 2021-05-08 DIAGNOSIS — E1165 Type 2 diabetes mellitus with hyperglycemia: Secondary | ICD-10-CM | POA: Diagnosis present

## 2021-05-08 DIAGNOSIS — Z79899 Other long term (current) drug therapy: Secondary | ICD-10-CM

## 2021-05-08 DIAGNOSIS — D649 Anemia, unspecified: Secondary | ICD-10-CM | POA: Diagnosis present

## 2021-05-08 DIAGNOSIS — I63411 Cerebral infarction due to embolism of right middle cerebral artery: Secondary | ICD-10-CM | POA: Diagnosis not present

## 2021-05-08 DIAGNOSIS — Z85828 Personal history of other malignant neoplasm of skin: Secondary | ICD-10-CM

## 2021-05-08 DIAGNOSIS — Z713 Dietary counseling and surveillance: Secondary | ICD-10-CM

## 2021-05-08 DIAGNOSIS — R29701 NIHSS score 1: Secondary | ICD-10-CM | POA: Diagnosis present

## 2021-05-08 DIAGNOSIS — E11649 Type 2 diabetes mellitus with hypoglycemia without coma: Secondary | ICD-10-CM

## 2021-05-08 DIAGNOSIS — I10 Essential (primary) hypertension: Secondary | ICD-10-CM | POA: Diagnosis not present

## 2021-05-08 DIAGNOSIS — I2510 Atherosclerotic heart disease of native coronary artery without angina pectoris: Secondary | ICD-10-CM | POA: Diagnosis present

## 2021-05-08 DIAGNOSIS — Z7984 Long term (current) use of oral hypoglycemic drugs: Secondary | ICD-10-CM | POA: Diagnosis not present

## 2021-05-08 DIAGNOSIS — Z8249 Family history of ischemic heart disease and other diseases of the circulatory system: Secondary | ICD-10-CM

## 2021-05-08 DIAGNOSIS — E118 Type 2 diabetes mellitus with unspecified complications: Secondary | ICD-10-CM

## 2021-05-08 LAB — URINALYSIS, COMPLETE (UACMP) WITH MICROSCOPIC
Bilirubin Urine: NEGATIVE
Glucose, UA: 50 mg/dL — AB
Hgb urine dipstick: NEGATIVE
Ketones, ur: 5 mg/dL — AB
Nitrite: NEGATIVE
Protein, ur: NEGATIVE mg/dL
Specific Gravity, Urine: 1.025 (ref 1.005–1.030)
pH: 5 (ref 5.0–8.0)

## 2021-05-08 LAB — RESP PANEL BY RT-PCR (FLU A&B, COVID) ARPGX2
Influenza A by PCR: NEGATIVE
Influenza B by PCR: NEGATIVE
SARS Coronavirus 2 by RT PCR: NEGATIVE

## 2021-05-08 LAB — CBC
HCT: 37 % (ref 36.0–46.0)
Hemoglobin: 11.7 g/dL — ABNORMAL LOW (ref 12.0–15.0)
MCH: 25 pg — ABNORMAL LOW (ref 26.0–34.0)
MCHC: 31.6 g/dL (ref 30.0–36.0)
MCV: 79.1 fL — ABNORMAL LOW (ref 80.0–100.0)
Platelets: 309 10*3/uL (ref 150–400)
RBC: 4.68 MIL/uL (ref 3.87–5.11)
RDW: 13.6 % (ref 11.5–15.5)
WBC: 9 10*3/uL (ref 4.0–10.5)
nRBC: 0 % (ref 0.0–0.2)

## 2021-05-08 LAB — GLUCOSE, CAPILLARY: Glucose-Capillary: 129 mg/dL — ABNORMAL HIGH (ref 70–99)

## 2021-05-08 LAB — BASIC METABOLIC PANEL
Anion gap: 11 (ref 5–15)
BUN: 23 mg/dL (ref 8–23)
CO2: 22 mmol/L (ref 22–32)
Calcium: 9.2 mg/dL (ref 8.9–10.3)
Chloride: 104 mmol/L (ref 98–111)
Creatinine, Ser: 1.07 mg/dL — ABNORMAL HIGH (ref 0.44–1.00)
GFR, Estimated: 56 mL/min — ABNORMAL LOW (ref >60)
Glucose, Bld: 232 mg/dL — ABNORMAL HIGH (ref 70–99)
Potassium: 4 mmol/L (ref 3.5–5.1)
Sodium: 137 mmol/L (ref 135–145)

## 2021-05-08 LAB — TROPONIN I (HIGH SENSITIVITY): Troponin I (High Sensitivity): 8 ng/L (ref <18)

## 2021-05-08 IMAGING — MR MR HEAD W/O CM
12 series · 45 of 48 positions shown · non-contrast
Comparison: Head CT [DATE]

CLINICAL DATA: Weakness.

EXAM:
MRI HEAD WITHOUT CONTRAST
TECHNIQUE: Multiplanar, multiecho pulse sequences of the brain and surrounding
structures were obtained without intravenous contrast.

[Series 5: ax dwi_tracew · axial · 3.0mm · 0.65mm/px · z∈[-117,+37]mm · 4 of 48 slices shown]
[im 1/48]
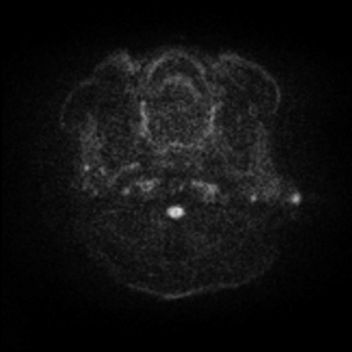
[im 16/48]
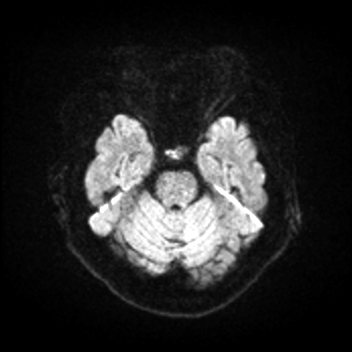
[im 32/48]
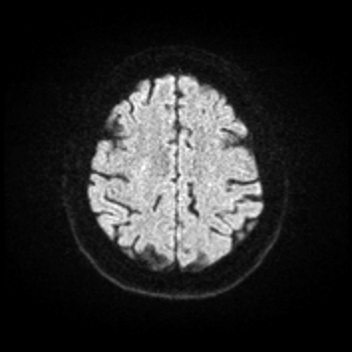
[im 48/48]
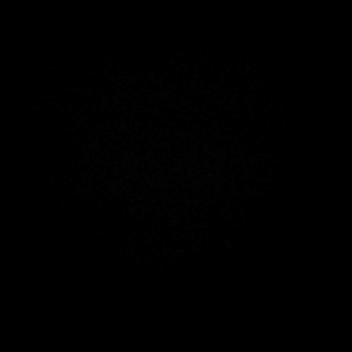

[Series 6: ax dwi_adc · axial · 3.0mm · 0.65mm/px · z∈[-117,+34]mm · 3 of 47 slices shown]
[im 1/47]
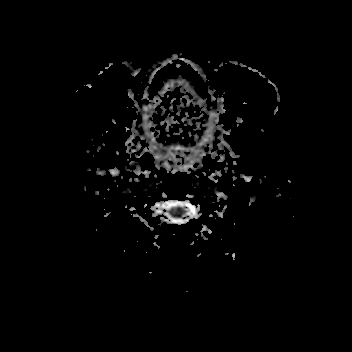
[im 24/47]
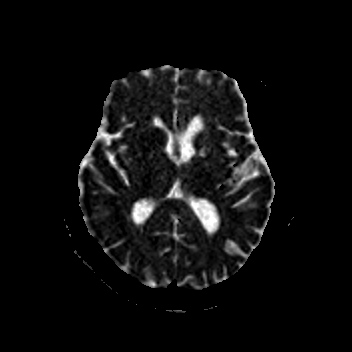
[im 47/47]
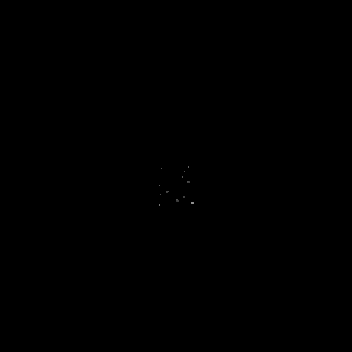

[Series 7: cor dwi_tracew · coronal · 5.0mm · 0.60mm/px · 5 of 76 slices shown]
[im 1/76]
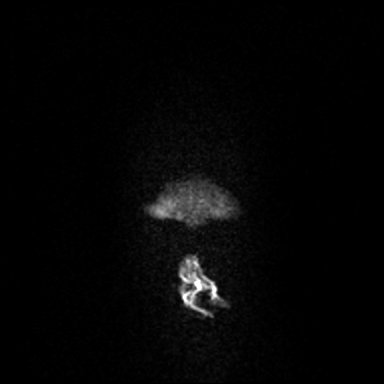
[im 19/76]
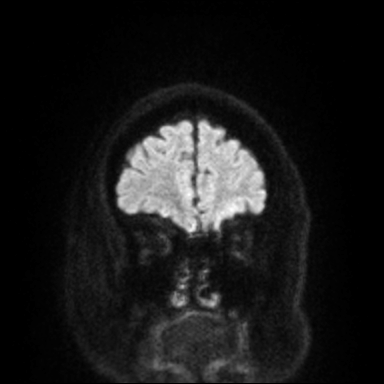
[im 38/76]
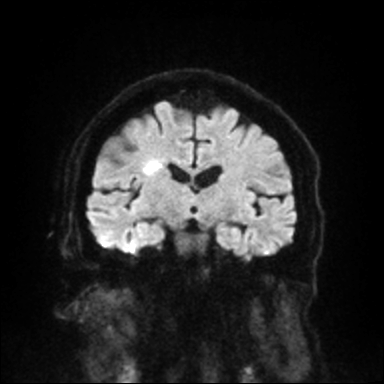
[im 57/76]
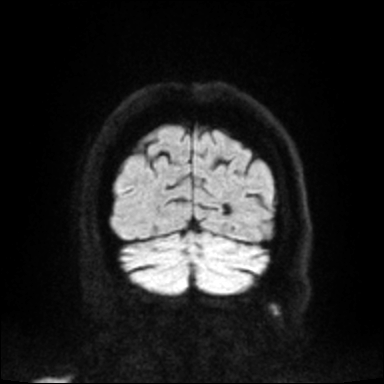
[im 76/76]
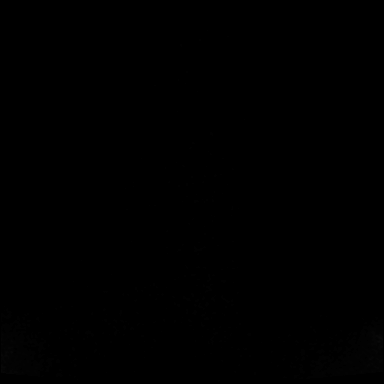

[Series 8: cor dwi_adc · coronal · 5.0mm · 0.60mm/px · 2 of 36 slices shown]
[im 1/36]
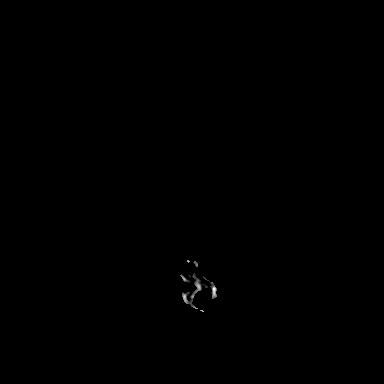
[im 36/36]
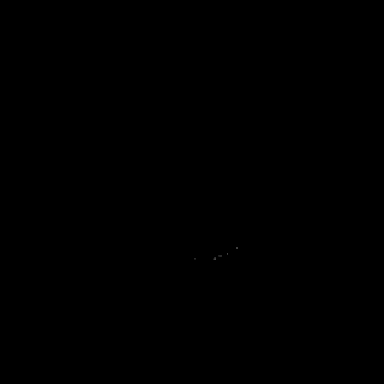

[Series 9: T1 · sagittal · 5.0mm · 0.62mm/px · 2 of 25 slices shown (1 of 2)]
[im 1/25]
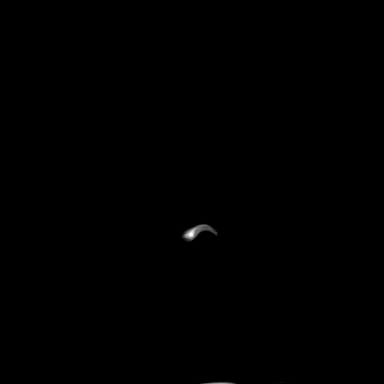
[im 25/25]
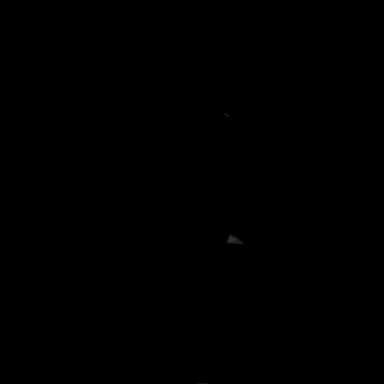

[Series 10: T2 · axial · 5.0mm · 0.53mm/px · z∈[-112,+32]mm · 2 of 25 slices shown (1 of 2)]
[im 1/25]
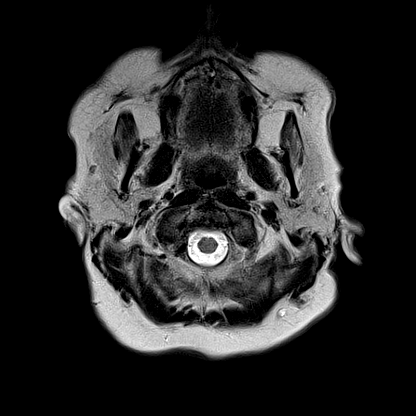
[im 25/25]
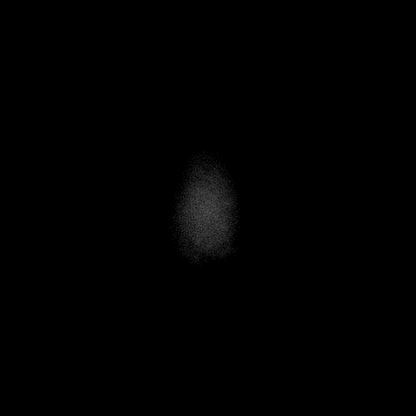

[Series 11: mag_images · axial · 3.0mm · 0.90mm/px · z∈[-128,+48]mm · 4 of 60 slices shown]
[im 1/60]
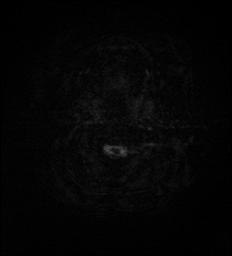
[im 20/60]
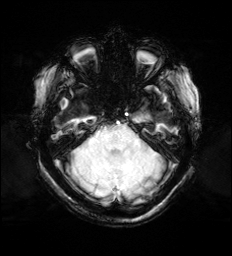
[im 40/60]
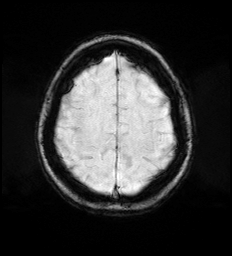
[im 60/60]
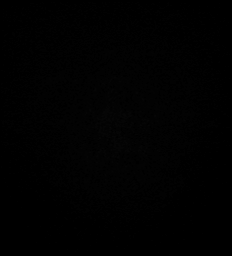

[Series 12: pha_images · axial · 3.0mm · 0.90mm/px · z∈[-128,+48]mm · 4 of 59 slices shown]
[im 1/59]
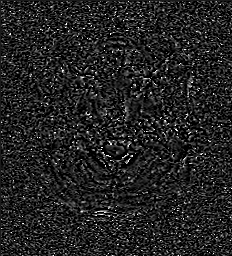
[im 20/59]
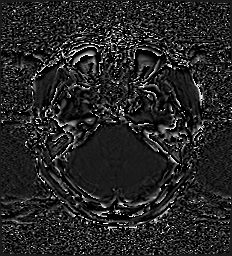
[im 39/59]
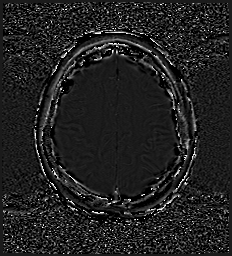
[im 59/59]
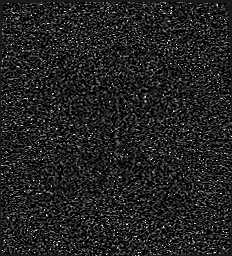

[Series 13: swi_images · axial · 3.0mm · 0.90mm/px · z∈[-128,+48]mm · 4 of 60 slices shown]
[im 1/60]
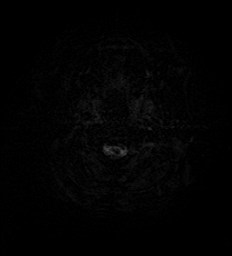
[im 20/60]
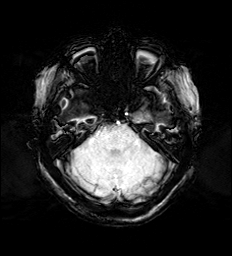
[im 40/60]
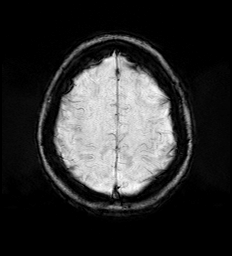
[im 60/60]
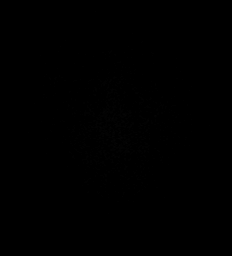

[Series 15: FLAIR · axial · 3.0mm · 0.53mm/px · z∈[-121,+41]mm · 4 of 55 slices shown]
[im 1/55]
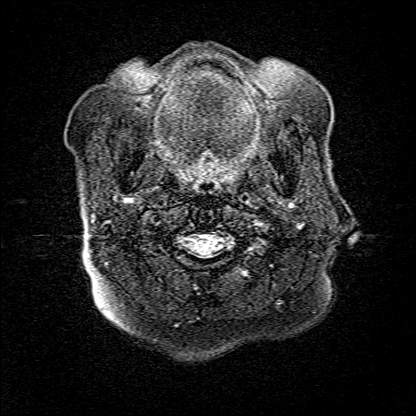
[im 19/55]
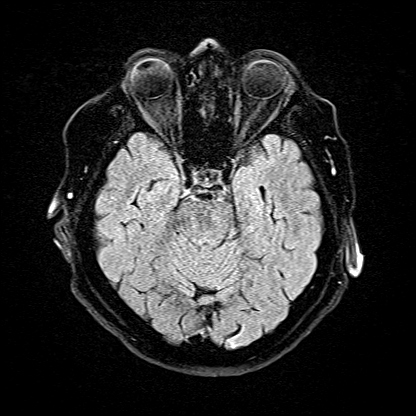
[im 37/55]
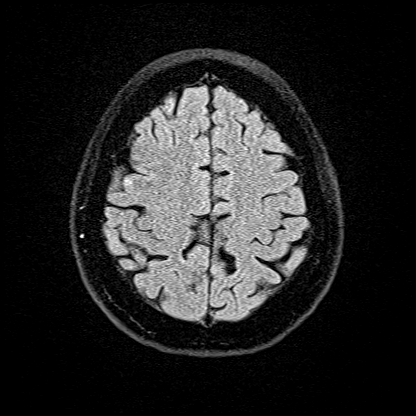
[im 55/55]
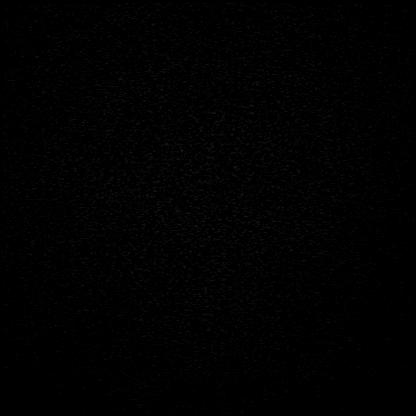

[Series 16: T1 · axial · 1.0mm · 0.98mm/px · z∈[-128,+46]mm · 9 of 175 slices shown (2 of 2)]
[im 1/175]
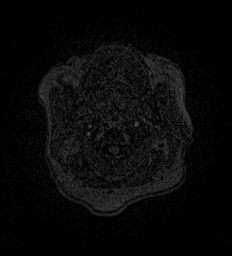
[im 16/175]
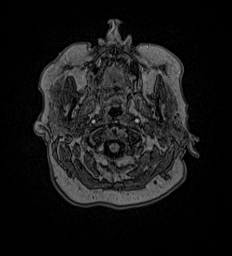
[im 32/175]
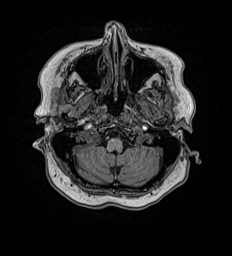
[im 48/175]
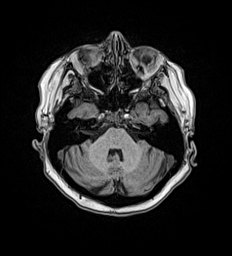
[im 80/175]
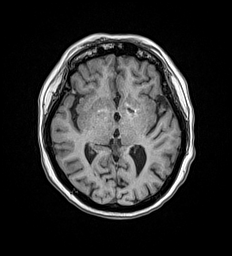
[im 95/175]
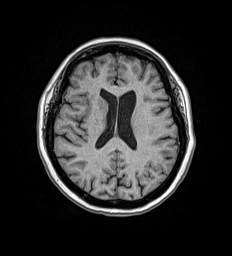
[im 127/175]
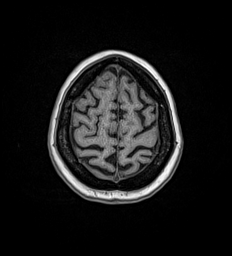
[im 143/175]
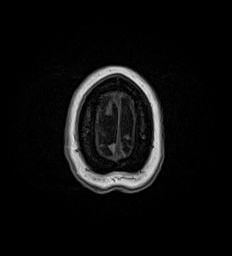
[im 175/175]
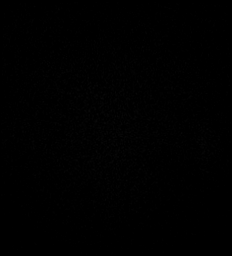

[Series 17: T2 · coronal · 5.0mm · 0.57mm/px · 2 of 29 slices shown (2 of 2)]
[im 1/29]
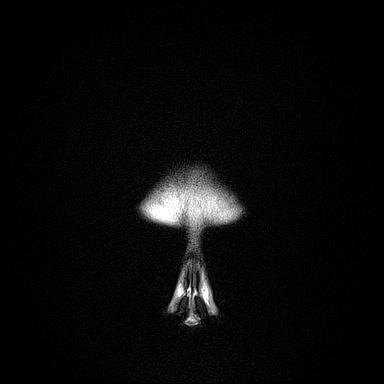
[im 29/29]
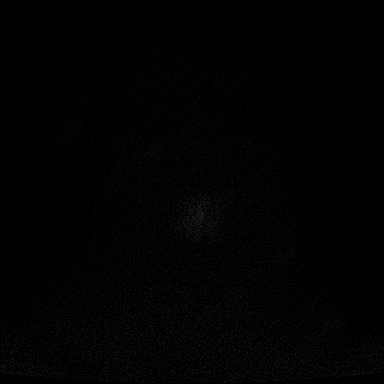

[45 of 48 positions shown; findings below may reference images not displayed]

FINDINGS: Brain: There are acute to early subacute infarcts involving white
matter in the right frontal lobe primarily at the level of the
corona radiata, and there is also a single subcentimeter acute or
early subacute cortical infarct laterally in the right frontal lobe.
There is a chronic infarct in the left basal ganglia with associated
chronic blood products. There is also a small chronic cortical
infarct in the posteroinferior left occipital lobe. Mild cerebral
atrophy is within normal limits for age. No mass, midline shift, or
extra-axial fluid collection is evident.

Vascular: Major intracranial vascular flow voids are preserved.

Skull and upper cervical spine: Unremarkable bone marrow signal.

Sinuses/Orbits: Unremarkable orbits. Paranasal sinuses and mastoid
air cells are clear.

Other: None.
IMPRESSION: 1. Acute to early subacute right frontal lobe infarcts.
2. Chronic left basal ganglia and left occipital infarcts.

## 2021-05-08 IMAGING — CT CT HEAD W/O CM
4 series · 16 of 47 positions shown, 18 images · non-contrast
Comparison: CT head [DATE]

CLINICAL DATA: TIA.  Weakness and left knee pain

EXAM:
CT HEAD WITHOUT CONTRAST
TECHNIQUE: Contiguous axial images were obtained from the base of the skull
through the vertex without intravenous contrast.

[Series 2: head wo · axial · 0.42mm/px · z∈[-86,+14]mm · 7 of 28 slices shown, 9 images]
[im 4/28  brain]
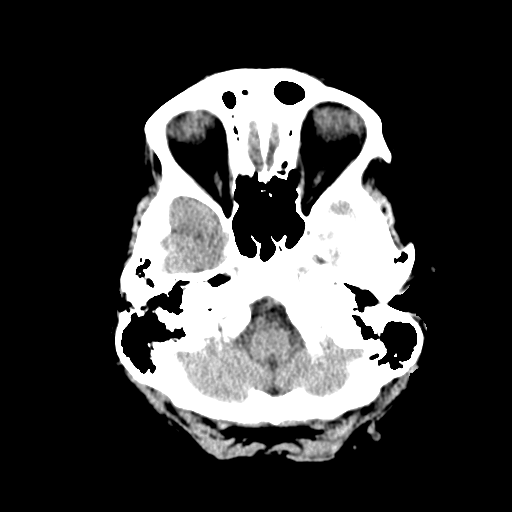
[im 4/28  bone]
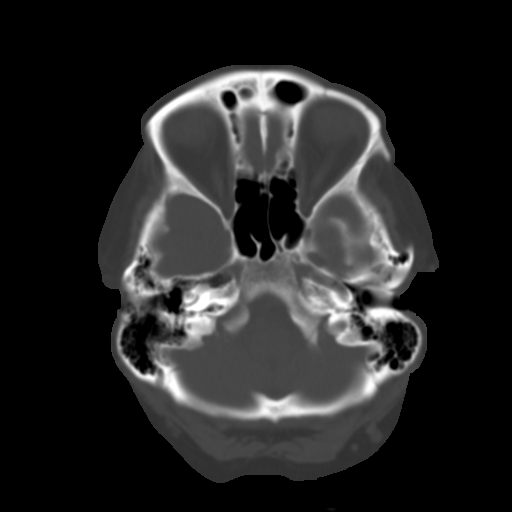
[im 7/28  brain]
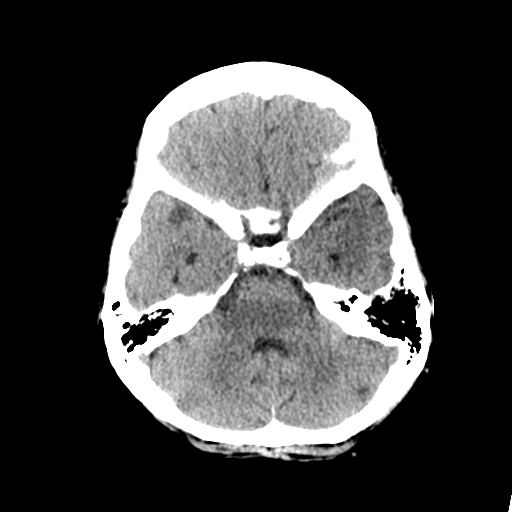
[im 11/28  brain]
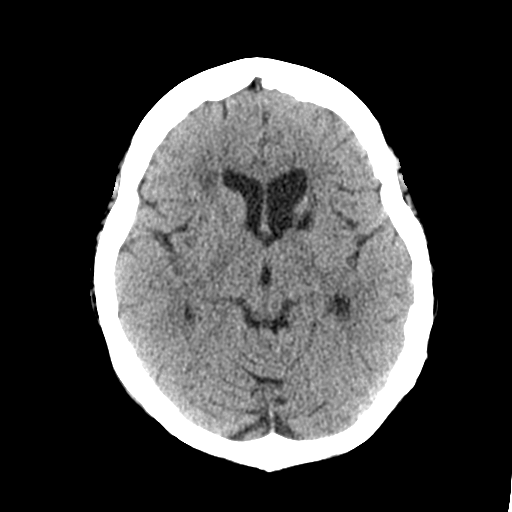
[im 14/28  brain]
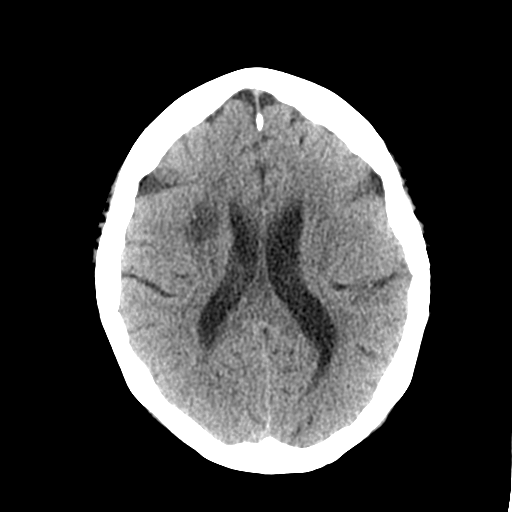
[im 17/28  brain]
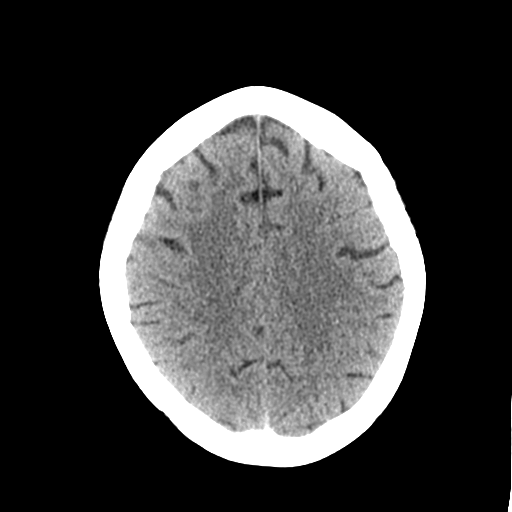
[im 17/28  bone]
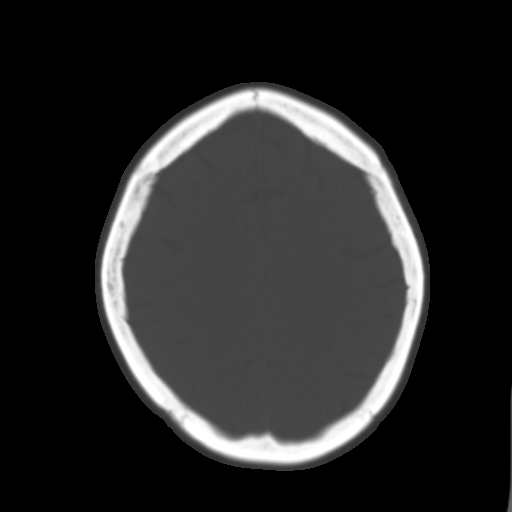
[im 21/28  brain]
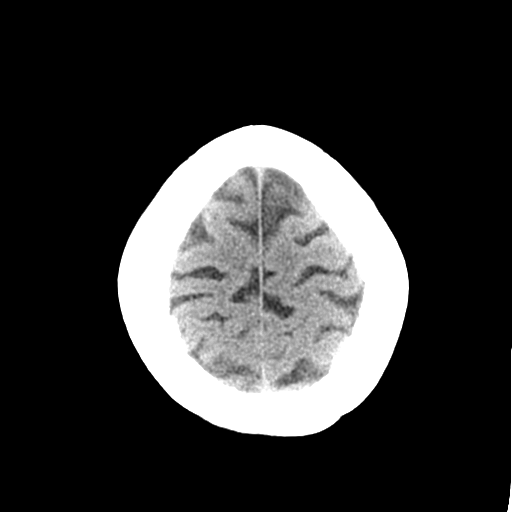
[im 24/28  brain]
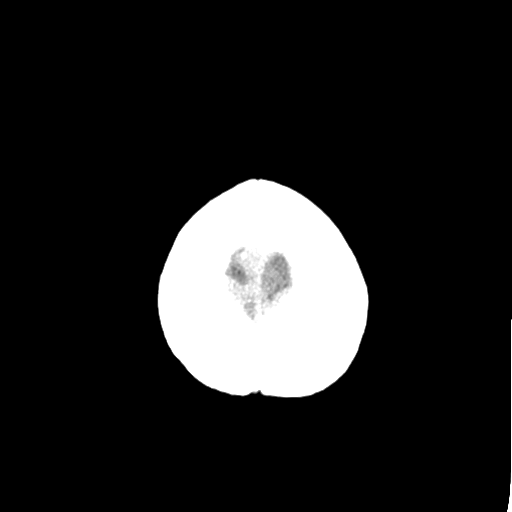

[Series 3: head bone · axial · 0.42mm/px · z∈[-89,-61]mm · 3 of 70 slices shown]
[im 7/70  bone]
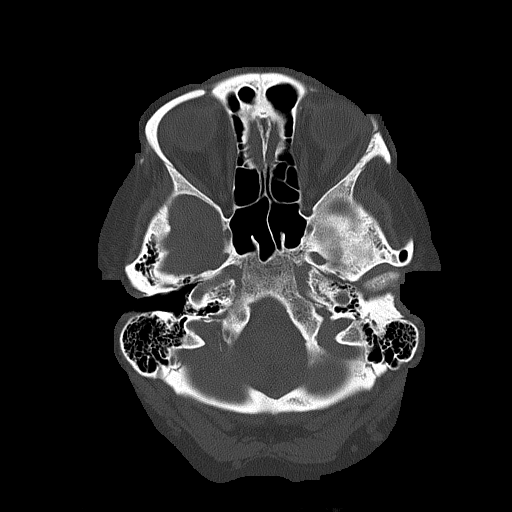
[im 14/70  bone]
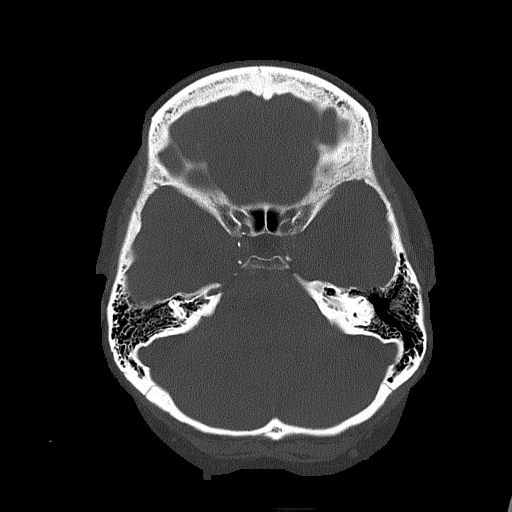
[im 21/70  bone]
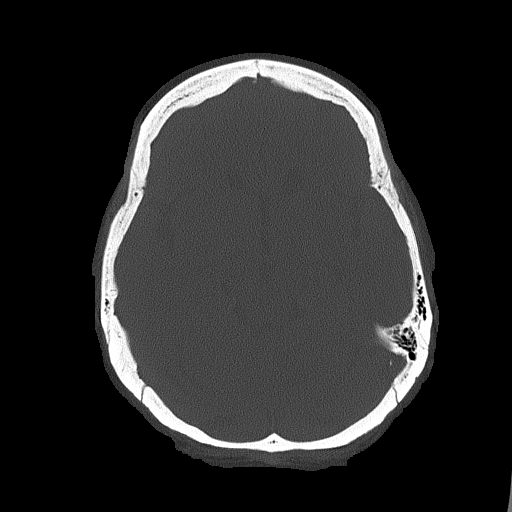

[Series 4: coronal soft tissue · coronal · 0.29mm/px · 3 of 58 slices shown]
[im 20/58  brain]
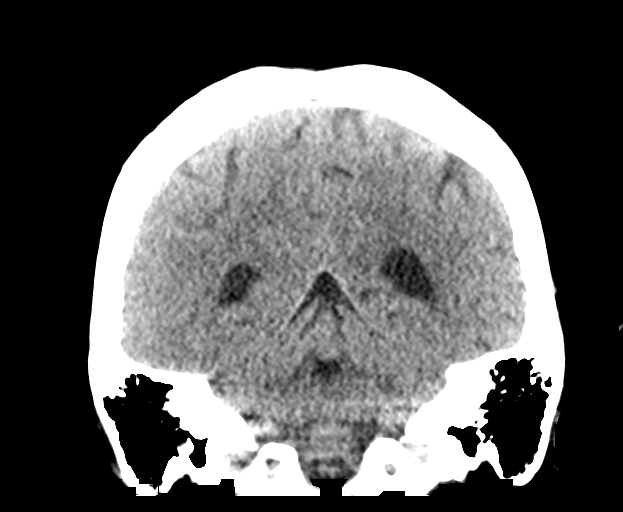
[im 26/58  brain]
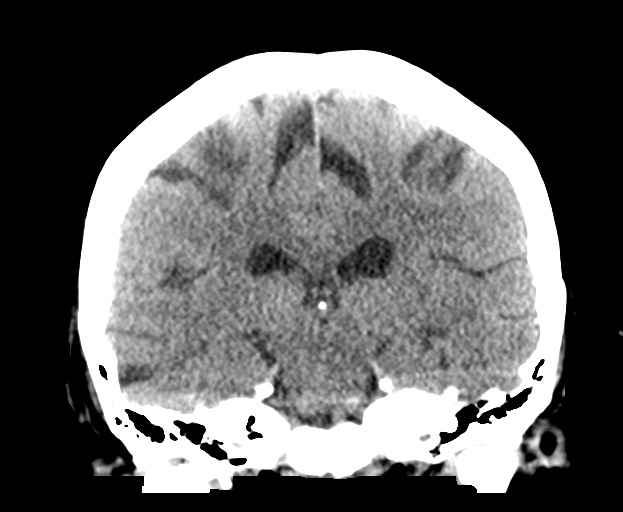
[im 32/58  brain]
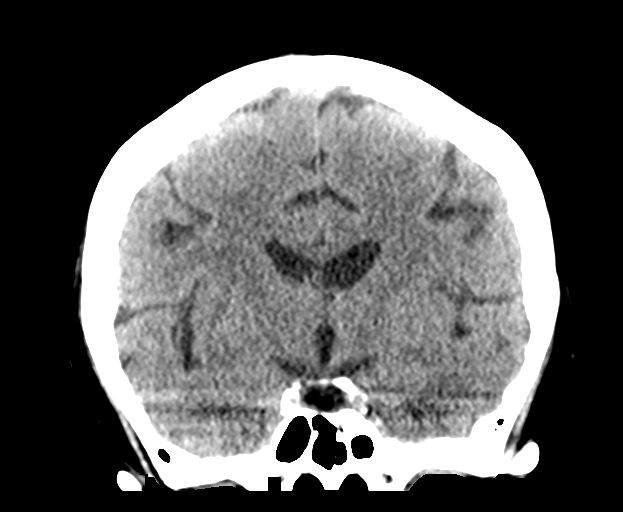

[Series 5: sagittal soft tissue · sagittal · 0.29mm/px · 3 of 50 slices shown]
[im 17/50  brain]
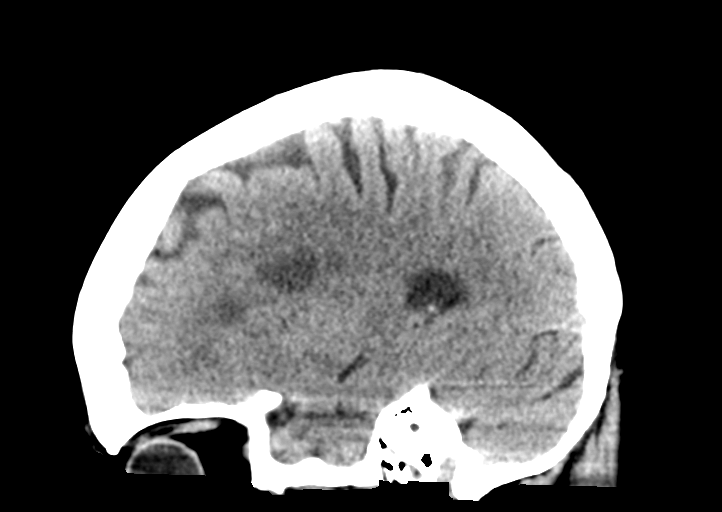
[im 25/50  brain]
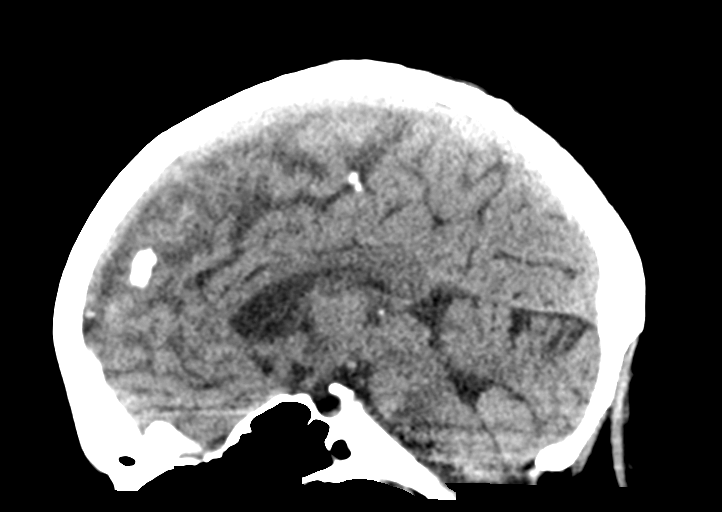
[im 33/50  brain]
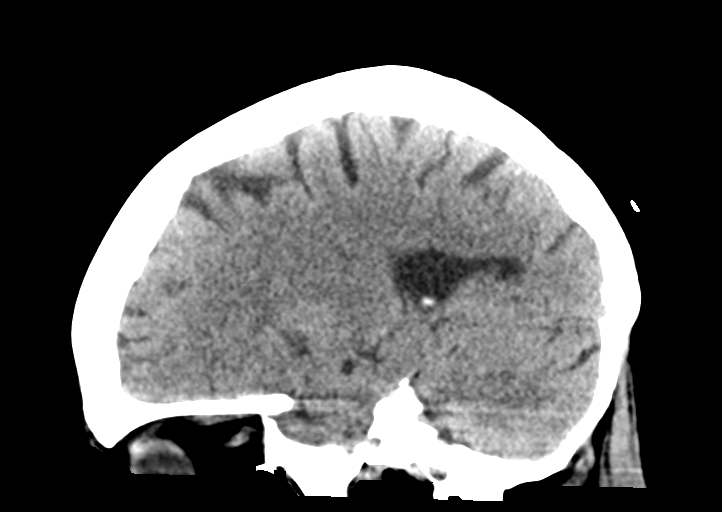

[16 of 47 positions shown; findings below may reference images not displayed]

FINDINGS: Brain: Ventricle size and cerebral volume normal for age.
Ill-defined hypodensities in the right frontal white matter are new
since the prior study. These are compatible with infarct of
indeterminate age, possibly subacute.

Well-defined hypodensity left basal ganglia and internal capsule
anteriorly compatible with chronic infarct. This was not present
previously.

Negative for hemorrhage or mass.

Vascular: Negative for hyperdense vessel

Skull: Negative

Sinuses/Orbits: Negative

Other: None
IMPRESSION: Ill-defined hypodensities right frontal white matter compatible with
ischemia of indeterminate age. Possible subacute

Chronic infarct left basal ganglia

No acute hemorrhage.

## 2021-05-08 IMAGING — DX DG KNEE COMPLETE 4+V*L*
5 series · 5 of 5 positions shown · non-contrast
Comparison: None.

CLINICAL DATA: Recent fall with left knee pain, initial encounter

EXAM:
LEFT KNEE - COMPLETE 4+ VIEW

[knee ap]
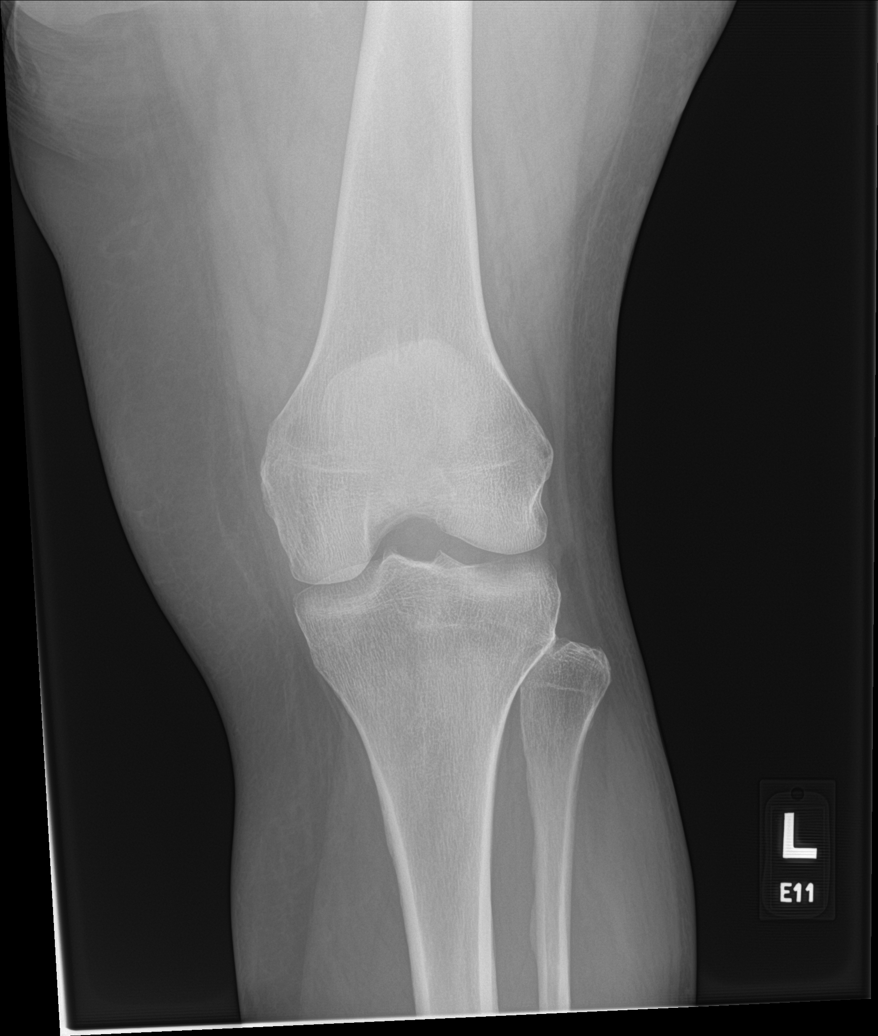

[knee tunnel]
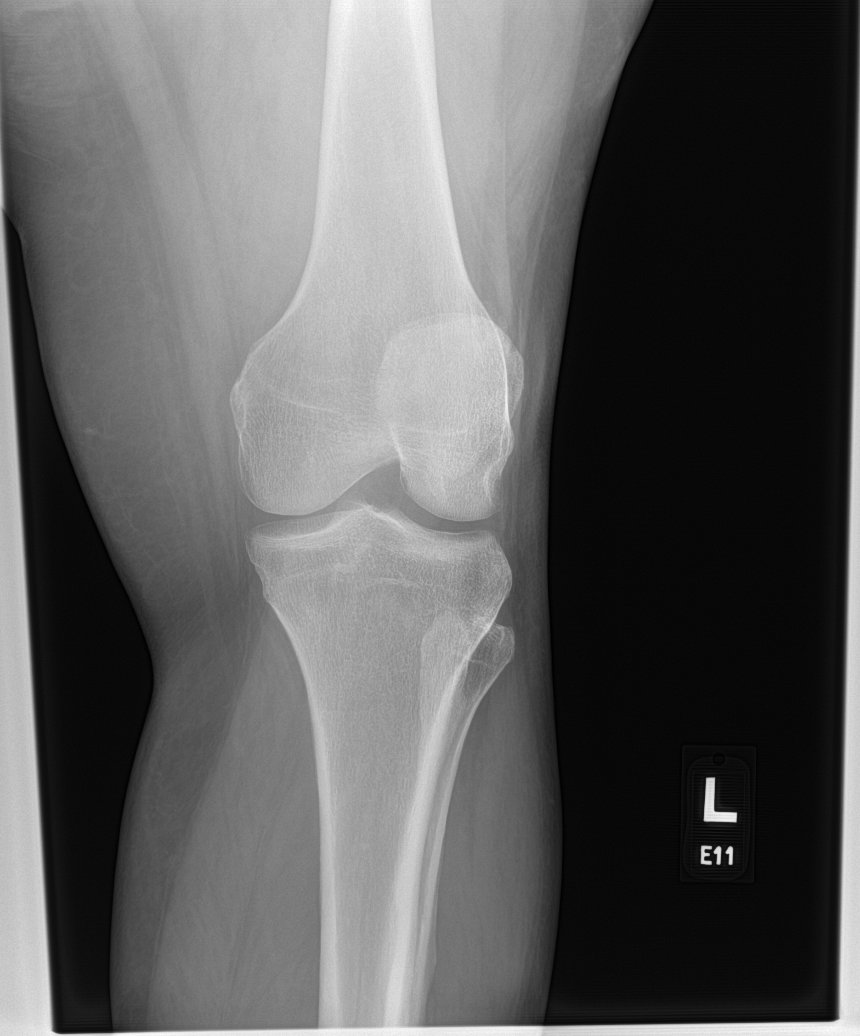

[knee lat]
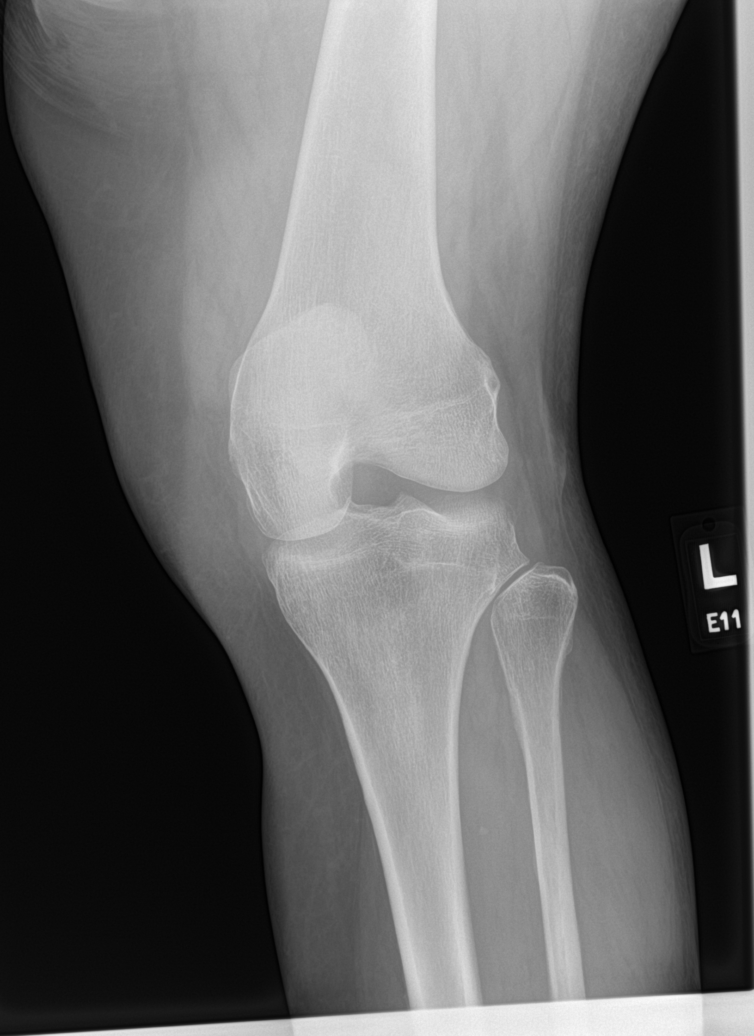

[patella skyline]
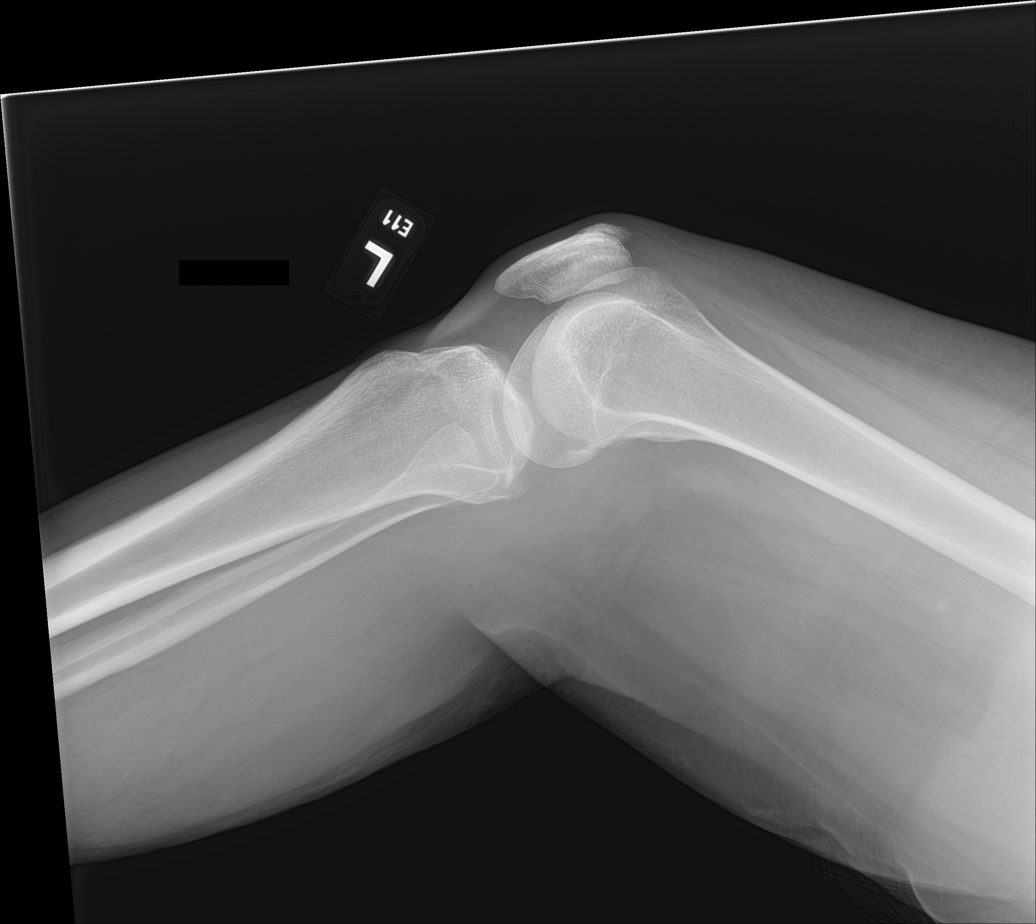

[knee obl]
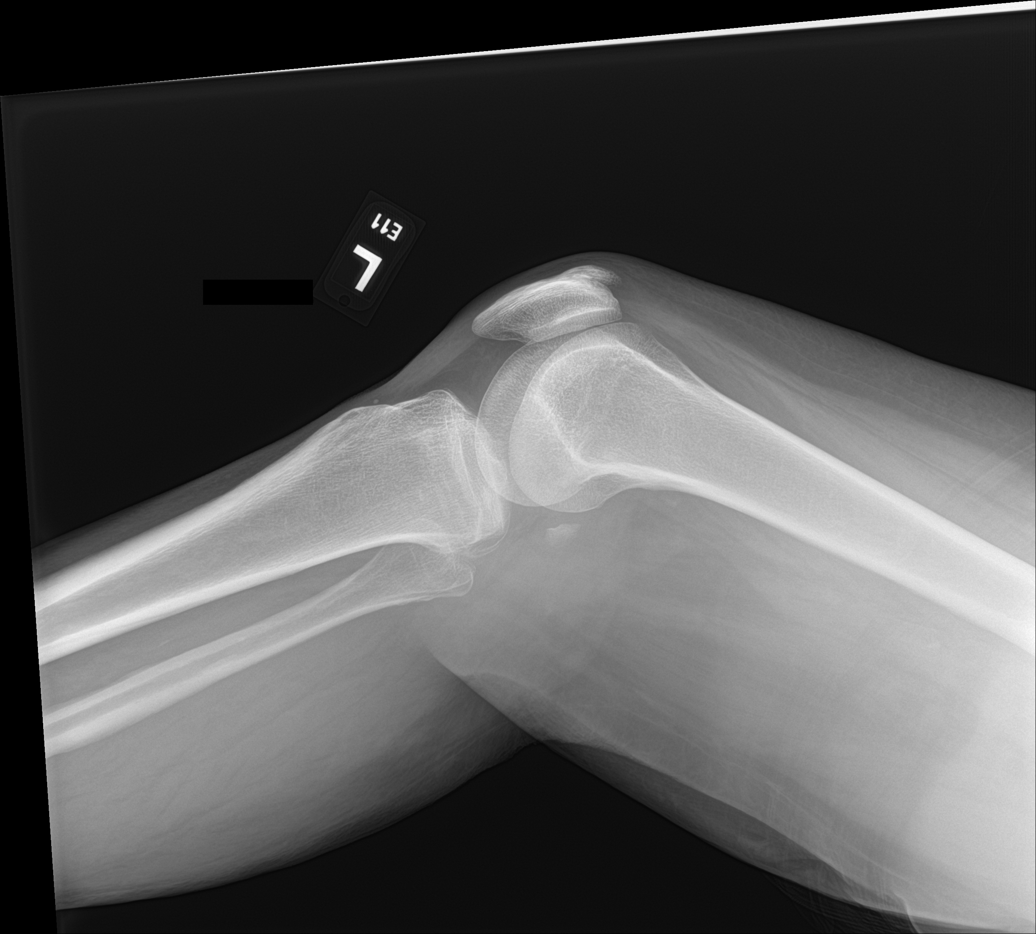

[5 of 5 positions shown; findings below may reference images not displayed]

FINDINGS: No evidence of fracture, dislocation, or joint effusion. No evidence
of arthropathy or other focal bone abnormality. Soft tissues are
unremarkable.
IMPRESSION: No acute abnormality noted.

## 2021-05-08 IMAGING — US US CAROTID DUPLEX BILAT
1 series · 13 of 24 positions shown · non-contrast
Comparison: None.

CLINICAL DATA: 69-year-old female with a history of CVA

EXAM:
BILATERAL CAROTID DUPLEX ULTRASOUND
TECHNIQUE: Gray scale imaging, color Doppler and duplex ultrasound were
performed of bilateral carotid and vertebral arteries in the neck.

[Series 1: us carotid bilateral · 13 of 62 slices shown]
[im 1/62]
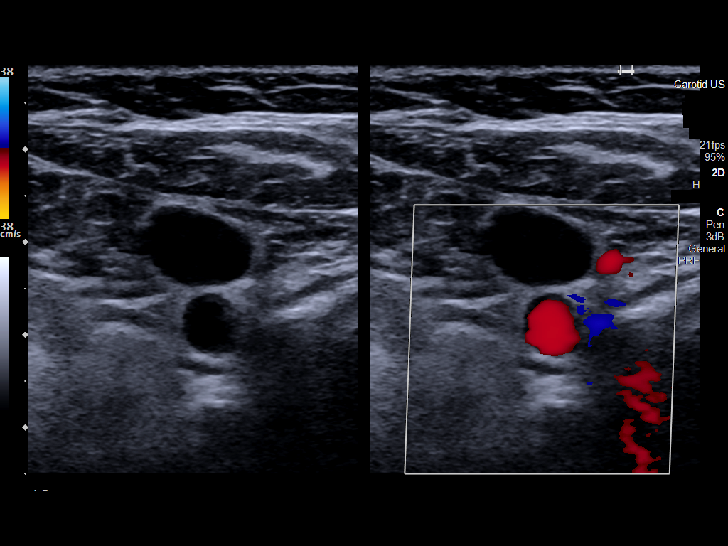
[im 6/62]
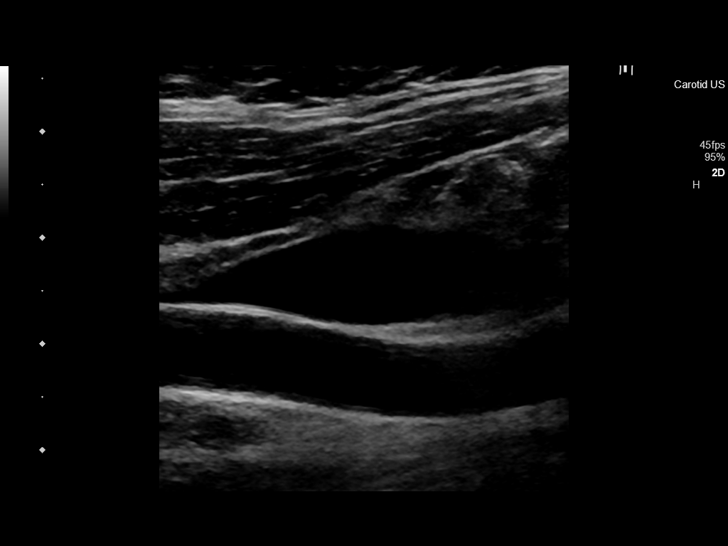
[im 11/62]
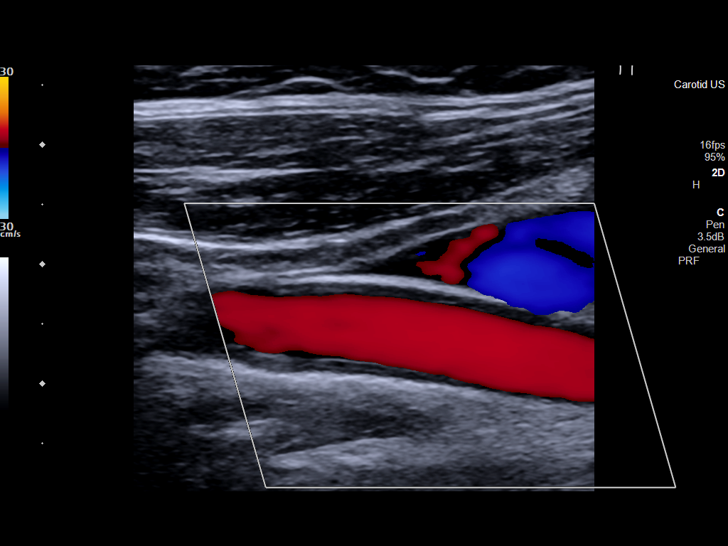
[im 16/62]
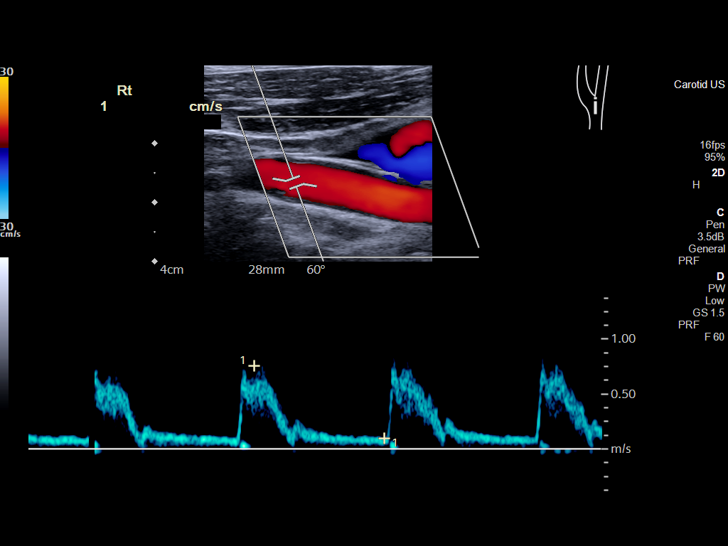
[im 22/62]
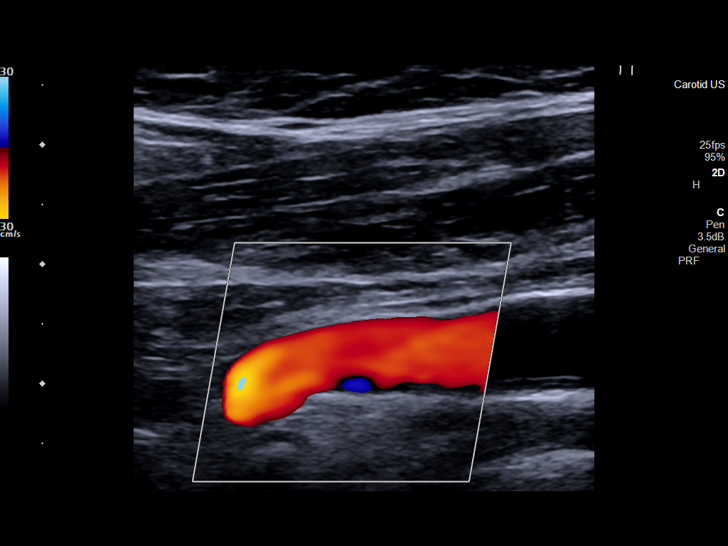
[im 27/62]
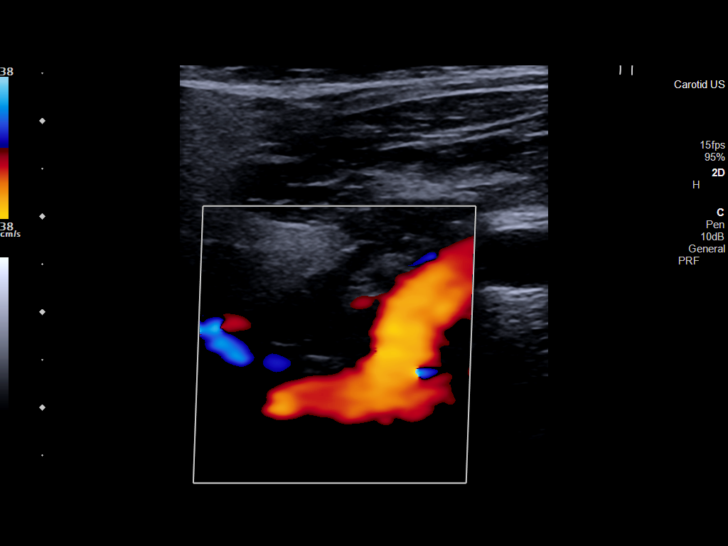
[im 32/62]
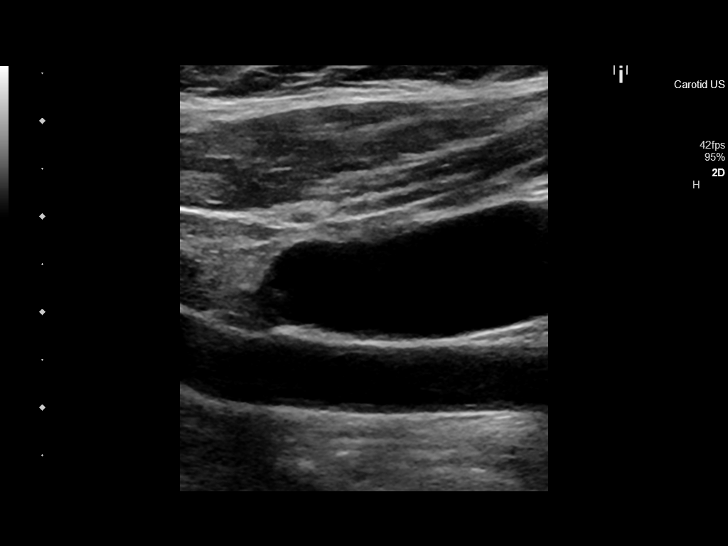
[im 35/62]
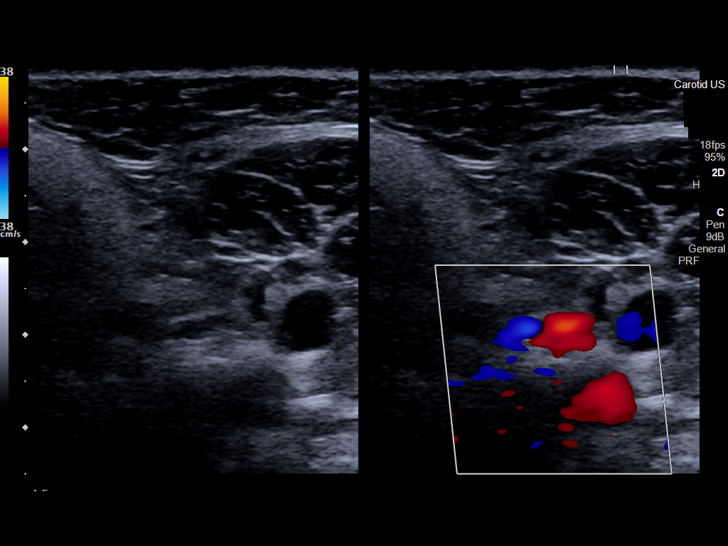
[im 40/62]
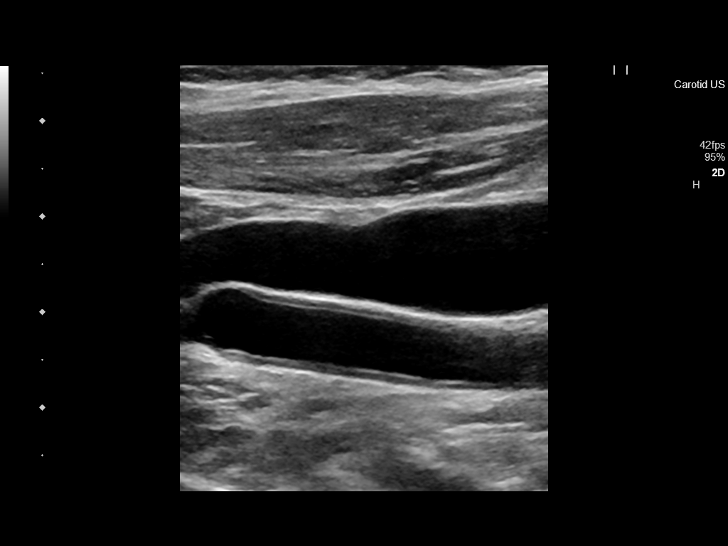
[im 46/62]
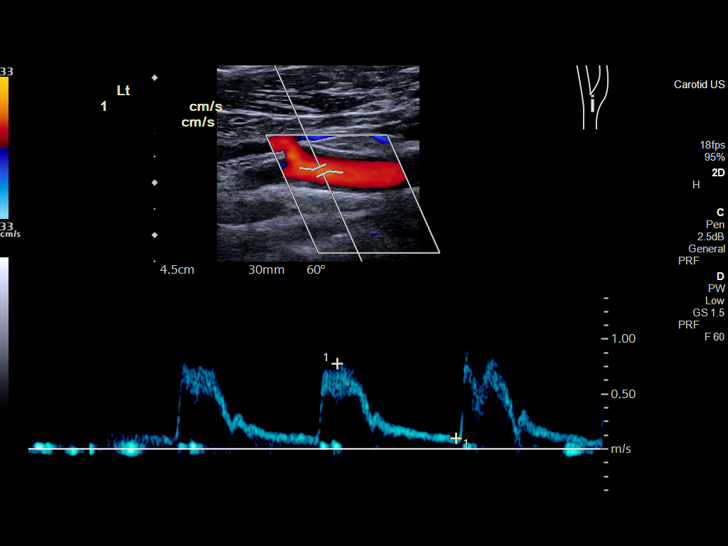
[im 51/62]
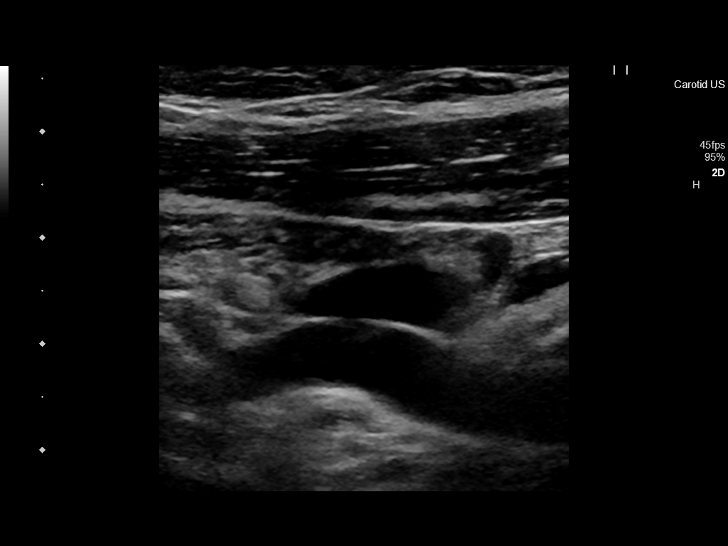
[im 56/62]
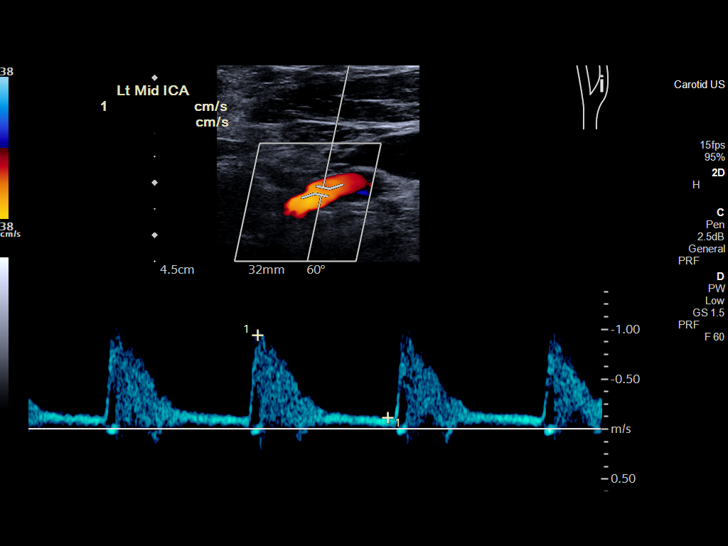
[im 62/62]
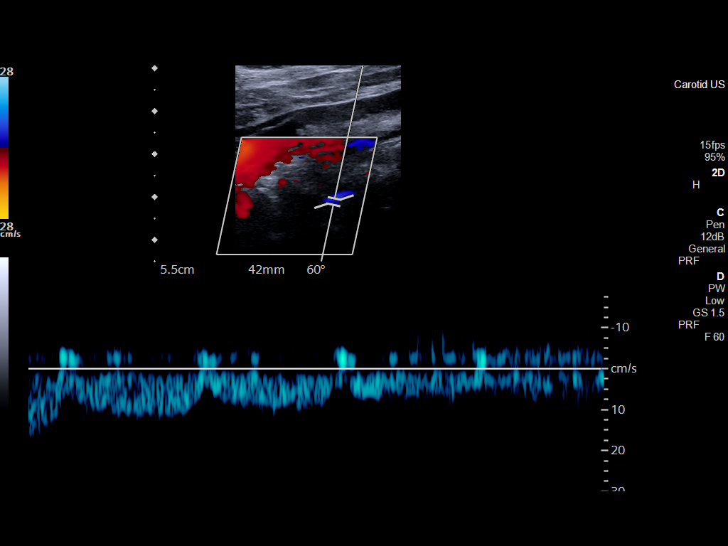

[13 of 24 positions shown; findings below may reference images not displayed]

FINDINGS: Criteria: Quantification of carotid stenosis is based on velocity
parameters that correlate the residual internal carotid diameter
with NASCET-based stenosis levels, using the diameter of the distal
internal carotid lumen as the denominator for stenosis measurement.

The following velocity measurements were obtained:

RIGHT

ICA:  Systolic 118 cm/sec, Diastolic 19 cm/sec

CCA:  110 cm/sec

SYSTOLIC ICA/CCA RATIO:

ECA:  128 cm/sec

LEFT

ICA:  Systolic 95 cm/sec, Diastolic 11 cm/sec

CCA:  84 cm/sec

SYSTOLIC ICA/CCA RATIO:

ECA:  157 cm/sec

Right Brachial SBP: Not acquired

Left Brachial SBP: Not acquired

RIGHT CAROTID ARTERY: No significant calcified disease of the right
common carotid artery. Intermediate waveform maintained.
Heterogeneous plaque without significant calcifications at the right
carotid bifurcation. Low resistance waveform of the right ICA. No
significant tortuosity.

RIGHT VERTEBRAL ARTERY: Antegrade flow with low resistance waveform.

LEFT CAROTID ARTERY: No significant calcified disease of the left
common carotid artery. Intermediate waveform maintained.
Heterogeneous plaque at the left carotid bifurcation without
significant calcifications. Low resistance waveform of the left ICA.

LEFT VERTEBRAL ARTERY: Left vertebral artery not identified.
IMPRESSION: Color duplex indicates moderate heterogeneous plaque with no
hemodynamically significant stenosis by duplex criteria in the
extracranial cerebrovascular circulation.

Left vertebral artery waveform not identified.

## 2021-05-08 MED ORDER — ASPIRIN EC 81 MG PO TBEC
81.0000 mg | DELAYED_RELEASE_TABLET | Freq: Every day | ORAL | Status: DC
  Administered 2021-05-09 – 2021-05-10 (×2): 81 mg via ORAL
  Filled 2021-05-08 (×2): qty 1

## 2021-05-08 MED ORDER — ASPIRIN 81 MG PO CHEW
81.0000 mg | CHEWABLE_TABLET | Freq: Once | ORAL | Status: DC
  Filled 2021-05-08: qty 1

## 2021-05-08 MED ORDER — ACETAMINOPHEN 325 MG PO TABS: 650.0000 mg | ORAL_TABLET | ORAL | Status: DC | PRN

## 2021-05-08 MED ORDER — HEPARIN SODIUM (PORCINE) 5000 UNIT/ML IJ SOLN
5000.0000 [IU] | Freq: Three times a day (TID) | INTRAMUSCULAR | Status: DC
  Administered 2021-05-08 – 2021-05-10 (×6): 5000 [IU] via SUBCUTANEOUS
  Filled 2021-05-08 (×6): qty 1

## 2021-05-08 MED ORDER — ACETAMINOPHEN 650 MG RE SUPP: 650.0000 mg | RECTAL | Status: DC | PRN

## 2021-05-08 MED ORDER — LEVOTHYROXINE SODIUM 50 MCG PO TABS: 100.0000 ug | ORAL_TABLET | Freq: Every day | ORAL | Status: DC

## 2021-05-08 MED ORDER — HYDRALAZINE HCL 20 MG/ML IJ SOLN
10.0000 mg | Freq: Three times a day (TID) | INTRAMUSCULAR | Status: DC | PRN
  Administered 2021-05-08: 23:00:00 10 mg via INTRAVENOUS
  Filled 2021-05-08: qty 1

## 2021-05-08 MED ORDER — ATORVASTATIN CALCIUM 20 MG PO TABS
80.0000 mg | ORAL_TABLET | Freq: Every day | ORAL | Status: DC
  Administered 2021-05-08 – 2021-05-09 (×2): 80 mg via ORAL
  Filled 2021-05-08 (×2): qty 4

## 2021-05-08 MED ORDER — ASPIRIN 81 MG PO CHEW
81.0000 mg | CHEWABLE_TABLET | Freq: Once | ORAL | Status: AC
  Administered 2021-05-08: 18:00:00 81 mg via ORAL
  Filled 2021-05-08: qty 1

## 2021-05-08 MED ORDER — NORTRIPTYLINE HCL 10 MG PO CAPS
10.0000 mg | ORAL_CAPSULE | Freq: Every day | ORAL | Status: DC
  Administered 2021-05-09 – 2021-05-10 (×2): 10 mg via ORAL
  Filled 2021-05-08 (×2): qty 1

## 2021-05-08 MED ORDER — STROKE: EARLY STAGES OF RECOVERY BOOK: Freq: Once | Status: AC

## 2021-05-08 MED ORDER — SODIUM CHLORIDE 0.9 % IV SOLN: INTRAVENOUS | Status: DC

## 2021-05-08 MED ORDER — INSULIN ASPART 100 UNIT/ML IJ SOLN
0.0000 [IU] | Freq: Three times a day (TID) | INTRAMUSCULAR | Status: DC
  Administered 2021-05-09: 12:00:00 5 [IU] via SUBCUTANEOUS
  Administered 2021-05-09: 1 [IU] via SUBCUTANEOUS
  Filled 2021-05-08 (×2): qty 1

## 2021-05-08 MED ORDER — INSULIN ASPART 100 UNIT/ML IJ SOLN: 0.0000 [IU] | Freq: Every day | INTRAMUSCULAR | Status: DC

## 2021-05-08 MED ORDER — ACETAMINOPHEN 160 MG/5ML PO SOLN
650.0000 mg | ORAL | Status: DC | PRN
  Filled 2021-05-08: qty 20.3

## 2021-05-08 MED ORDER — CITALOPRAM HYDROBROMIDE 20 MG PO TABS
10.0000 mg | ORAL_TABLET | Freq: Every day | ORAL | Status: DC
  Administered 2021-05-09 – 2021-05-10 (×2): 10 mg via ORAL
  Filled 2021-05-08 (×2): qty 1

## 2021-05-08 MED ORDER — ATORVASTATIN CALCIUM 20 MG PO TABS: 40.0000 mg | ORAL_TABLET | Freq: Every day | ORAL | Status: DC

## 2021-05-08 NOTE — ED Notes (Signed)
Pt ambulated to toilet in room to void. Steady gait noted. No help needed to ambulate. Back in bed.

## 2021-05-08 NOTE — ED Notes (Addendum)
Pt roomed in ED> Pt states she fell 3 weeks ago because her "legs just gave out". Has been "feeling weak" for about 3 weeks. Denies NVD. Alert and oriented at this time.  Denies unilateral weakness or numbness. States her daughter called EMS because yesterday her speech was "hard to understand".

## 2021-05-08 NOTE — ED Triage Notes (Signed)
First Nurse Note:  Arrives via [facility] from home.  Per report, daughter reports that yesterday patient had slurred speech, symptoms resolved.  Today, patient c/o weakness.  EMS reports CBG:  257  175/108 BP.  Daughter reports mom has not been taking medication was prescribed. Patient AOx4

## 2021-05-08 NOTE — ED Notes (Signed)
Pt in MRI.

## 2021-05-08 NOTE — ED Notes (Signed)
Daughter at bedside.  Pt spoke with MRI screener.  Pt provided ice chips.

## 2021-05-08 NOTE — H&P (Signed)
History and Physical    [name] INO:[id number] DOB: 15-Jan-1951 DOA: 05/08/2021  PCP: [facility], [place]    Patient coming from:  Home   Chief Complaint:  Slurred speech   HPI: Madison Davidson is a 70 y.o. female seen in ed with complaints of slurred speech.  Patient states that her daughter called EMS to bring her to the hospital because her speech was not normal.  This per report happened yesterday and is currently resolved.  Patient states that it all kind of started when she started feeling tired a few weeks ago.  About 3 weeks ago patient states that she fell while trying to feed her cats and did not lose consciousness, her knees gave out and she fell into what sounds like a litter box then she cleaned herself off got up and was feeling fine so she did not seek any medical attention.  But she has been feeling more weak and tired and not having any energy.  Patient states that she lives alone, and otherwise is able to manage herself and bathe herself do her ADLs and finances.  There is a question of history of stroke patient is unclear about it she says that she was told to take an aspirin which she only took for few days.  Patient does not note any allergies to any medications.  Patient denies any bleeding or chest pains or palpitations or vision or gait issues.   Pt has past medical history of diabetes mellitus type 2, dyslipidemia, hypertension, CAD, history of stroke. ED Course:  Vitals:   05/08/21 1726 05/08/21 1815 05/08/21 1830 05/08/21 1900  BP: (!) 142/93 (!) 140/97 (!) 192/74   Pulse: 77 81 77 69  Resp: 13 15 19 18   Temp:      SpO2: 100% 100% 98% 100%  Weight:      Height:      In the emergency room patient is alert awake oriented and cooperative oxygenating normal on room air at 100%.  BMP shows an elevated glucose of 232, creatinine of 1.07, troponin of 8, CBC shows hemoglobin of 11.7, MCV of 79.1, platelets of 309, respiratory panel is negative for  flu and COVID.  Today's noncontrast head CT shows ill-defined hypodensity in the right frontal white matter compatible with ischemia of indeterminate age, Chronic infarct in the left basal ganglia, no acute hemorrhage.  MRI of the brain without contrast shows acute to early subacute right frontal lobe infarcts, chronic left basal ganglia and left occipital infarcts.  At asked the nurse to give the patient aspirin 81 and Lipitor 80 as patient has passed a bedside swallow screen.  Review of Systems:  Review of Systems  Constitutional:  Positive for malaise/fatigue.  Neurological:  Positive for weakness.  All other systems reviewed and are negative.   Past Medical History:  Diagnosis Date   Cancer [facility])    skin   Coronary artery disease    Diabetes mellitus without complication ([place])    Hypertension    Stroke [facility])     Past Surgical History:  Procedure Laterality Date   ABDOMINAL HYSTERECTOMY       reports that she has never smoked. She has never used smokeless tobacco. She reports that she does not drink alcohol and does not use drugs.  No Known Allergies  Family History  Problem Relation Age of Onset   Heart disease Mother    Heart disease Father     Prior to Admission medications  Medication Sig Start Date End Date Taking? Authorizing Provider  glipiZIDE (GLUCOTROL) 10 MG tablet Take 10 mg by mouth 2 (two) times daily.   Yes Marguerita Merles, MD  metFORMIN (GLUCOPHAGE) 500 MG tablet Take 1,000 mg by mouth 2 (two) times daily with a meal.   Yes Marguerita Merles, MD  aspirin EC 81 MG tablet Take 81 mg by mouth as needed.    [provider]  atorvastatin (LIPITOR) 40 MG tablet Take 40 mg by mouth daily. Takes around [place], Connye Burkitt, MD  celecoxib (CELEBREX) 200 MG capsule Take 200 mg by mouth daily.    Marguerita Merles, MD  citalopram (CELEXA) 10 MG tablet Take 10 mg by mouth daily.    Marguerita Merles, MD  diltiazem (DILT-XR) 120 MG 24 hr capsule Take 120 mg by mouth  daily.    Marguerita Merles, MD  fexofenadine (ALLEGRA) 180 MG tablet Take 180 mg by mouth daily. Takes 1/2 tablet daily    [provider]  levothyroxine (SYNTHROID, LEVOTHROID) 100 MCG tablet Take 100 mcg by mouth daily.    Marguerita Merles, MD  nortriptyline (PAMELOR) 10 MG capsule Take 10 mg by mouth at bedtime.     Marguerita Merles, MD  saxagliptin HCl (ONGLYZA) 5 MG TABS tablet Take 5 mg by mouth daily.    Marguerita Merles, MD    Physical Exam: Vitals:   05/08/21 1726 05/08/21 1815 05/08/21 1830 05/08/21 1900  BP: (!) 142/93 (!) 140/97 (!) 192/74   Pulse: 77 81 77 69  Resp: 13 15 19 18   Temp:      SpO2: 100% 100% 98% 100%  Weight:      Height:       Physical Exam Vitals and nursing note reviewed.  Constitutional:      General: She is not in acute distress.    Appearance: Normal appearance. She is obese. She is not ill-appearing or toxic-appearing.  HENT:     Head: Normocephalic and atraumatic.     Right Ear: External ear normal.     Left Ear: External ear normal.     Nose: Nose normal.     Mouth/Throat:     Mouth: Mucous membranes are moist.  Eyes:     Extraocular Movements: Extraocular movements intact.     Pupils: Pupils are equal, round, and reactive to light.  Neck:     Vascular: No carotid bruit.  Cardiovascular:     Rate and Rhythm: Normal rate and regular rhythm.     Pulses: Normal pulses.     Heart sounds: Normal heart sounds.  Pulmonary:     Effort: Pulmonary effort is normal.     Breath sounds: Normal breath sounds.  Abdominal:     General: Bowel sounds are normal. There is no distension.     Palpations: Abdomen is soft. There is no mass.     Tenderness: There is no abdominal tenderness. There is no guarding.     Hernia: No hernia is present.  Musculoskeletal:     Right lower leg: No edema.     Left lower leg: No edema.  Skin:    General: Skin is warm.     Findings: No lesion or rash.  Neurological:     General: No focal deficit present.      Mental Status: She is alert and oriented to person, place, and time.     Cranial Nerves: Cranial nerves are intact. No  cranial nerve deficit or facial asymmetry.     Sensory: Sensation is intact. No sensory deficit.     Motor: Motor function is intact. No weakness, tremor or pronator drift.     Coordination: Coordination is intact. Coordination normal. Finger-Nose-Finger Test normal.     Deep Tendon Reflexes:     Reflex Scores:      Bicep reflexes are 2+ on the right side and 2+ on the left side.      Patellar reflexes are 2+ on the right side and 2+ on the left side. Psychiatric:        Mood and Affect: Mood normal.        Behavior: Behavior normal.     Labs on Admission: I have personally reviewed following labs and imaging studies  No results for input(s): CKTOTAL, CKMB, TROPONINI in the last 72 hours. Lab Results  Component Value Date   WBC 9.0 05/08/2021   HGB 11.7 (L) 05/08/2021   HCT 37.0 05/08/2021   MCV 79.1 (L) 05/08/2021   PLT 309 05/08/2021    Recent Labs  Lab 05/08/21 1258  NA 137  K 4.0  CL 104  CO2 22  BUN 23  CREATININE 1.07*  CALCIUM 9.2  GLUCOSE 232*   No results found for: CHOL, HDL, LDLCALC, TRIG No results found for: DDIMER Invalid input(s): POCBNP  Urinalysis    Component Value Date/Time   COLORURINE YELLOW (A) 05/08/2021 1515   APPEARANCEUR HAZY (A) 05/08/2021 1515   LABSPEC 1.025 05/08/2021 1515   PHURINE 5.0 05/08/2021 1515   GLUCOSEU 50 (A) 05/08/2021 1515   HGBUR NEGATIVE 05/08/2021 1515   BILIRUBINUR NEGATIVE 05/08/2021 1515   KETONESUR 5 (A) 05/08/2021 1515   PROTEINUR NEGATIVE 05/08/2021 1515   NITRITE NEGATIVE 05/08/2021 1515   LEUKOCYTESUR TRACE (A) 05/08/2021 1515   COVID-19 Labs No results for input(s): DDIMER, FERRITIN, LDH, CRP in the last 72 hours. Lab Results  Component Value Date   [place] NEGATIVE 05/08/2021    Radiological Exams on Admission: CT Head Wo Contrast  Result Date: 05/08/2021 CLINICAL DATA:   TIA.  Weakness and left knee pain EXAM: CT HEAD WITHOUT CONTRAST TECHNIQUE: Contiguous axial images were obtained from the base of the skull through the vertex without intravenous contrast. COMPARISON:  CT head 09/13/2017 FINDINGS: Brain: Ventricle size and cerebral volume normal for age. Ill-defined hypodensities in the right frontal white matter are new since the prior study. These are compatible with infarct of indeterminate age, possibly subacute. Well-defined hypodensity left basal ganglia and internal capsule anteriorly compatible with chronic infarct. This was not present previously. Negative for hemorrhage or mass. Vascular: Negative for hyperdense vessel Skull: Negative Sinuses/Orbits: Negative Other: None IMPRESSION: Ill-defined hypodensities right frontal white matter compatible with ischemia of indeterminate age. Possible subacute Chronic infarct left basal ganglia No acute hemorrhage. Electronically Signed   By: Franchot Gallo M.D.   On: 05/08/2021 13:39   MR BRAIN WO CONTRAST  Result Date: 05/08/2021 CLINICAL DATA:  Weakness. EXAM: MRI HEAD WITHOUT CONTRAST TECHNIQUE: Multiplanar, multiecho pulse sequences of the brain and surrounding structures were obtained without intravenous contrast. COMPARISON:  Head CT 05/08/2021 FINDINGS: Brain: There are acute to early subacute infarcts involving white matter in the right frontal lobe primarily at the level of the corona radiata, and there is also a single subcentimeter acute or early subacute cortical infarct laterally in the right frontal lobe. There is a chronic infarct in the left basal ganglia with associated chronic blood products. There is also  a small chronic cortical infarct in the posteroinferior left occipital lobe. Mild cerebral atrophy is within normal limits for age. No mass, midline shift, or extra-axial fluid collection is evident. Vascular: Major intracranial vascular flow voids are preserved. Skull and upper cervical spine: Unremarkable  bone marrow signal. Sinuses/Orbits: Unremarkable orbits. Paranasal sinuses and mastoid air cells are clear. Other: None. IMPRESSION: 1. Acute to early subacute right frontal lobe infarcts. 2. 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 Electronically Signed   By: Logan Bores M.D.   On: 05/08/2021 16:58   DG Knee Complete 4 Views Left  Result Date: 05/08/2021 CLINICAL DATA:  Recent fall with left knee pain, initial encounter EXAM: LEFT KNEE - COMPLETE 4+ VIEW COMPARISON:  None. FINDINGS: No evidence of fracture, dislocation, or joint effusion. No evidence of arthropathy or other focal bone abnormality. Soft tissues are unremarkable. IMPRESSION: No acute abnormality noted. Electronically Signed   By: Inez Catalina M.D.   On: 05/08/2021 15:32    EKG: Independently reviewed.  EKG shows sinus rhythm 74.  Echocardiogram: Pending    Assessment/Plan Principal Problem:   Slurred speech Active Problems:   CVA (cerebral vascular accident) ([place])   Benign essential hypertension   Hyperlipidemia, mixed   DM II (diabetes mellitus, type II), controlled ([place])   AKI (acute kidney injury) ([place])   Anemia   Slurred speech/CVA: Attribute patient's recent tiredness generalized weakness and slurred speech to acute right-sided CVA. Clinically patient is neurologically stable has no focal deficits cranial nerves are within normal limits, slurred speech has resolved. In the emergency room patient given aspirin 81 and Lipitor 80. Will admit patient to further complete her CVA work-up with a 2D echo and with bubble study for any PFO's, for embolic stroke evaluation along with ultrasound of the carotids, for any carotid stenosis.  We will also obtain an a.m. lipid panel and A1c to evaluate diabetes and further optimize her patient regimen. Physical therapy and Occupational Therapy as necessary. Will continue patient on daily aspirin and Lipitor.  Hypertension: Home regimen of diltiazem held to allow  for permissive hypertension. Hydralazine as needed 10 mg IV every 8 for systolic blood pressure 916 and above.   Hyperlipidemia: A.m. lipid panel and will continue Lipitor in the meantime.  Diabetes mellitus type 2: We will hold patient's home regimen of metformin and glipizide and continue patient on sliding scale on insulin and hypoglycemia protocol.   Acute kidney injury: Creatinine is mildly elevated at 1.07 with no prior to compare. Will currently hold patient's metformin, and monitor with low-dose IV fluid overnight. Avoid contrast studies.  Anemia: We will obtain an anemia panel B12 folate levels and follow.     DVT prophylaxis:  Heparin  Code Status:  Full code  Family Communication:  Murlean Iba (Daughter)  [phone] (Home Phone)   Disposition Plan:  Home  Consults called:  None  Admission status: Inpatient   Para Skeans MD [facility] [phone] How to contact the [facility] Attending or Consulting provider [place] or covering provider during after hours [place], for this patient.    Check the care team in [facility] and look for a) attending/consulting [facility] provider listed and b) the [facility] team listed Log into [email or web address] and use [place]'s universal password to access. If you do not have the password, please contact the hospital operator. Locate the [facility] provider you are looking for under [facility] and page to a number that you can be directly reached. If you  still have difficulty reaching the provider, please page the [facility] (Director on Call) for the Hospitalists listed on amion for assistance. [email or web address] Password [email or web address] 05/08/2021, 7:29 PM

## 2021-05-08 NOTE — ED Notes (Signed)
Provided water per pt request.

## 2021-05-08 NOTE — ED Provider Notes (Signed)
[facility] Emergency Department Provider Note  ____________________________________________   Event Date/Time   First MD Initiated Contact with Patient 05/08/21 1409     (approximate)  I have reviewed the triage vital signs and the nursing notes.   HISTORY  Chief Complaint Weakness    HPI Madison Davidson is a 70 y.o. female with diabetes, hypertension who comes in with concerns for altered mental status.  Patient reportedly had slurred speech yesterday that resolved.   Patient does report a fall 3 weeks ago but did not hit her head did not lose consciousness.  She states that she thinks she fell because her left knee gave out.  She reports some chronic pain of the left knee.  She has small abrasion on the knee.  She states that since this fall she has been feeling more weak.  Family member noted that she had some slurred speech yesterday but that this is since resolved but she continues to feel weak, constant, nothing makes it better, nothing makes it worse  Does report a history of stroke but she does not think she is taking an aspirin still          Past Medical History:  Diagnosis Date   Cancer ([place])    skin   Coronary artery disease    Diabetes mellitus without complication ([place])    Hypertension    Stroke ([place])     There are no problems to display for this patient.   Past Surgical History:  Procedure Laterality Date   ABDOMINAL HYSTERECTOMY      Prior to Admission medications   Medication Sig Start Date End Date Taking? Authorizing Provider  aspirin EC 81 MG tablet Take 81 mg by mouth as needed.    [provider]  atorvastatin (LIPITOR) 40 MG tablet Take 40 mg by mouth daily. Takes around Lyden, Connye Burkitt, MD  celecoxib (CELEBREX) 200 MG capsule Take 200 mg by mouth daily.    Marguerita Merles, MD  citalopram (CELEXA) 10 MG tablet Take 10 mg by mouth daily.    Marguerita Merles, MD  diltiazem (DILT-XR) 120 MG 24 hr capsule  Take 120 mg by mouth daily.    Marguerita Merles, MD  fexofenadine (ALLEGRA) 180 MG tablet Take 180 mg by mouth daily. Takes 1/2 tablet daily    [provider]  glipiZIDE (GLUCOTROL) 10 MG tablet Take 10 mg by mouth 2 (two) times daily.    Marguerita Merles, MD  levothyroxine (SYNTHROID, LEVOTHROID) 100 MCG tablet Take 100 mcg by mouth daily.    Marguerita Merles, MD  metFORMIN (GLUCOPHAGE) 500 MG tablet Take 1,000 mg by mouth 2 (two) times daily with a meal.    Marguerita Merles, MD  nortriptyline (PAMELOR) 10 MG capsule Take 10 mg by mouth at bedtime.     Marguerita Merles, MD  saxagliptin HCl (ONGLYZA) 5 MG TABS tablet Take 5 mg by mouth daily.    Marguerita Merles, MD    Allergies Patient has no known allergies.  No family history on file.  Social History Social History   Tobacco Use   Smoking status: Never   Smokeless tobacco: Never  Substance Use Topics   Alcohol use: No   Drug use: No      Review of Systems Constitutional: No fever/chills Eyes: No visual changes. ENT: No sore throat. Cardiovascular: Denies chest pain. Respiratory: Denies shortness of breath. Gastrointestinal: No abdominal pain.  No nausea, no vomiting.  No diarrhea.  No constipation. Genitourinary: Negative for dysuria. Musculoskeletal: Negative for back pain.  Knee pain left Skin: Negative for rash. Neurological: Negative for headaches, focal weakness or numbness.  Weakness, slurred speech All other ROS negative ____________________________________________   PHYSICAL EXAM:  VITAL SIGNS: ED Triage Vitals [05/08/21 1254]  Enc Vitals Group     BP (!) 171/60     Pulse Rate 83     Resp 18     Temp 98 F (36.7 C)     Temp src      SpO2 100 %     Weight      Height      Head Circumference      Peak Flow      Pain Score 3     Pain Loc      Pain Edu?      Excl. in [place]?     Constitutional: Alert and oriented. Well appearing and in no acute distress. Eyes: Conjunctivae are normal. EOMI. Head:  Atraumatic. Nose: No congestion/rhinnorhea. Mouth/Throat: Mucous membranes are moist.   Neck: No stridor. Trachea Midline. FROM Cardiovascular: Normal rate, regular rhythm. Grossly normal heart sounds.  Good peripheral circulation. Respiratory: Normal respiratory effort.  No retractions. Lungs CTAB. Gastrointestinal: Soft and nontender. No distention. No abdominal bruits.  Musculoskeletal: No lower extremity tenderness nor edema.  No joint effusions.  Small abrasion on the left knee Neurologic:  Normal speech and language.  Cranial nerves appear intact equal strength in arms and legs.  No obviously slurred speech. Skin:  Skin is warm, dry and intact. No rash noted. Psychiatric: Mood and affect are normal. Speech and behavior are normal. GU: Deferred   ____________________________________________   LABS (all labs ordered are listed, but only abnormal results are displayed)  Labs Reviewed  BASIC METABOLIC PANEL - Abnormal; Notable for the following components:      Result Value   Glucose, Bld 232 (*)    Creatinine, Ser 1.07 (*)    GFR, Estimated 56 (*)    All other components within normal limits  CBC - Abnormal; Notable for the following components:   Hemoglobin 11.7 (*)    MCV 79.1 (*)    MCH 25.0 (*)    All other components within normal limits  RESP PANEL BY RT-PCR (FLU A&B, COVID) ARPGX2  URINALYSIS, COMPLETE (UACMP) WITH MICROSCOPIC   ____________________________________________   ED ECG REPORT I, Vanessa Lorane, the attending physician, personally viewed and interpreted this ECG.  Normal sinus rate 74, no ST elevation, no T wave inversions of aVL, normal intervals ____________________________________________  RADIOLOGY   Official radiology report(s): CT Head Wo Contrast  Result Date: 05/08/2021 CLINICAL DATA:  TIA.  Weakness and left knee pain EXAM: CT HEAD WITHOUT CONTRAST TECHNIQUE: Contiguous axial images were obtained from the base of the skull through the  vertex without intravenous contrast. COMPARISON:  CT head 09/13/2017 FINDINGS: Brain: Ventricle size and cerebral volume normal for age. Ill-defined hypodensities in the right frontal white matter are new since the prior study. These are compatible with infarct of indeterminate age, possibly subacute. Well-defined hypodensity left basal ganglia and internal capsule anteriorly compatible with chronic infarct. This was not present previously. Negative for hemorrhage or mass. Vascular: Negative for hyperdense vessel Skull: Negative Sinuses/Orbits: Negative Other: None IMPRESSION: Ill-defined hypodensities right frontal white matter compatible with ischemia of indeterminate age. Possible subacute Chronic infarct left basal ganglia No acute hemorrhage. Electronically Signed   By: Franchot Gallo  M.D.   On: 05/08/2021 13:39   MR BRAIN WO CONTRAST  Result Date: 05/08/2021 CLINICAL DATA:  Weakness. EXAM: MRI HEAD WITHOUT CONTRAST TECHNIQUE: Multiplanar, multiecho pulse sequences of the brain and surrounding structures were obtained without intravenous contrast. COMPARISON:  Head CT 05/08/2021 FINDINGS: Brain: There are acute to early subacute infarcts involving white matter in the right frontal lobe primarily at the level of the corona radiata, and there is also a single subcentimeter acute or early subacute cortical infarct laterally in the right frontal lobe. There is a chronic infarct in the left basal ganglia with associated chronic blood products. There is also a small chronic cortical infarct in the posteroinferior left occipital lobe. Mild cerebral atrophy is within normal limits for age. No mass, midline shift, or extra-axial fluid collection is evident. Vascular: Major intracranial vascular flow voids are preserved. Skull and upper cervical spine: Unremarkable bone marrow signal. Sinuses/Orbits: Unremarkable orbits. Paranasal sinuses and mastoid air cells are clear. Other: None. IMPRESSION: 1. Acute to early  subacute right frontal lobe infarcts. 2. 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 Electronically Signed   By: Logan Bores M.D.   On: 05/08/2021 16:58   DG Knee Complete 4 Views Left  Result Date: 05/08/2021 CLINICAL DATA:  Recent fall with left knee pain, initial encounter EXAM: LEFT KNEE - COMPLETE 4+ VIEW COMPARISON:  None. FINDINGS: No evidence of fracture, dislocation, or joint effusion. No evidence of arthropathy or other focal bone abnormality. Soft tissues are unremarkable. IMPRESSION: No acute abnormality noted. Electronically Signed   By: Inez Catalina M.D.   On: 05/08/2021 15:32    ____________________________________________   PROCEDURES  Procedure(s) performed (including Critical Care):  .1-3 Lead EKG Interpretation  Date/Time: 05/08/2021 5:44 PM Performed by: Vanessa Balta, MD Authorized by: Vanessa Blanford, MD     Interpretation: normal     ECG rate:  70s   ECG rate assessment: normal     Rhythm: sinus rhythm     Ectopy: none     Conduction: normal     ____________________________________________   INITIAL IMPRESSION / ASSESSMENT AND PLAN / ED COURSE  Madison Davidson was evaluated in Emergency Department on 05/08/2021 for the symptoms described in the history of present illness. She was evaluated in the context of the global COVID-19 pandemic, which necessitated consideration that the patient might be at risk for infection with the SARS-CoV-2 virus that causes COVID-19. Institutional protocols and algorithms that pertain to the evaluation of patients at risk for COVID-19 are in a state of rapid change based on information released by regulatory bodies including the CDC and federal and state organizations. These policies and algorithms were followed during the patient's care in the ED.     Patient is a 70 year old who comes in for concern for some slurred speech yesterday and increasing weakness.  On my exam I do not see any obvious cranial nerve deficits  but will get CT head to evaluate for intercranial hemorrhage, labs to evaluate for Electra abnormalities, AKI, UTI, COVID  CT scan concerning for possible infarcts therefore I got a MRI that did confirm acute to subacute frontal infarcts.  Therefore will discuss hospital team for admission      ____________________________________________   FINAL CLINICAL IMPRESSION(S) / ED DIAGNOSES   Final diagnoses:  Ischemic stroke of frontal lobe ([place])      MEDICATIONS GIVEN DURING THIS VISIT:  Medications - No data to display   ED Discharge Orders  None        Note:  This document was prepared using Dragon voice recognition software and may include unintentional dictation errors.    Vanessa [place], MD 05/08/21 [phone]

## 2021-05-08 NOTE — Plan of Care (Signed)

## 2021-05-08 NOTE — Progress Notes (Signed)
   05/08/21 2048  Assess: MEWS Score  Temp 98 F (36.7 C)  BP (!) 224/71  Pulse Rate 73  Resp 16  SpO2 98 %  O2 Device Room Air  Assess: MEWS Score  MEWS Temp 0  MEWS Systolic 2  MEWS Pulse 0  MEWS RR 0  MEWS LOC 0  MEWS Score 2  MEWS Score Color Yellow  Assess: if the MEWS score is Yellow or Red  Were vital signs taken at a resting state? Yes  Focused Assessment No change from prior assessment  Early Detection of Sepsis Score *See Row Information* Low  MEWS guidelines implemented *See Row Information* No, vital signs rechecked  Treat  MEWS Interventions Administered prn meds/treatments  Pain Scale 0-10  Pain Score 0  Take Vital Signs  Increase Vital Sign Frequency  Yellow: Q 2hr X 2 then Q 4hr X 2, if remains yellow, continue Q 4hrs  Escalate  MEWS: Escalate Yellow: discuss with charge nurse/RN and consider discussing with provider and RRT  Notify: Charge Nurse/RN  Name of Charge Nurse/RN Notified Kennyth Lose  Date Charge Nurse/RN Notified 05/08/21  Time Charge Nurse/RN Notified 2100  Document  Patient Outcome Other (Comment)  Progress note created (see row info) Yes   Pt resting in bed. No current s/s distress. No c/o pain or discomfort. Will continue to monitor

## 2021-05-08 NOTE — ED Notes (Signed)
EDP at bedside examining pt.  Pt going to get up to void on toilet.

## 2021-05-08 NOTE — ED Notes (Signed)
Dr. Patel at bedside 

## 2021-05-08 NOTE — ED Notes (Signed)
Pt ambulated to toilet without without assistance. Steady gait noted. Back in bed.

## 2021-05-08 NOTE — ED Notes (Signed)
Second nurse at bedside attempting IV placement.

## 2021-05-08 NOTE — ED Notes (Signed)
Attempted IV placement 2 times. Pt unable to tolerate. No successful IV placement yet at this time.

## 2021-05-08 NOTE — ED Notes (Signed)
Provided [place] sandwich tray. Pt has passed stroke swallow screen.

## 2021-05-08 NOTE — ED Triage Notes (Signed)
Pt comes via EMs with c/o weakness and left knee pain. Pt states she just feels tired and not sleeping well. Pt states her daughter made her come bc yesterday she wasn't talking right.  Pt states fall 3 weeks ago but denies any LOC or hitting head. Pt states her legs just gave out on her.  Pt speaking in complete sentences. Mini neuro neg.

## 2021-05-09 ENCOUNTER — Inpatient Hospital Stay (HOSPITAL_COMMUNITY)
Admit: 2021-05-09 | Discharge: 2021-05-09 | Disposition: A | Discharge status: Home / Self Care | Payer: Medicare Other | Attending: Internal Medicine | Admitting: Internal Medicine

## 2021-05-09 ENCOUNTER — Inpatient Hospital Stay: Admit: 2021-05-09 | Payer: Medicare Other

## 2021-05-09 ENCOUNTER — Encounter: Payer: Self-pay | Admitting: Internal Medicine

## 2021-05-09 ENCOUNTER — Inpatient Hospital Stay: Payer: Medicare Other

## 2021-05-09 DIAGNOSIS — I6389 Other cerebral infarction: Secondary | ICD-10-CM

## 2021-05-09 DIAGNOSIS — I63411 Cerebral infarction due to embolism of right middle cerebral artery: Secondary | ICD-10-CM

## 2021-05-09 DIAGNOSIS — I161 Hypertensive emergency: Secondary | ICD-10-CM

## 2021-05-09 DIAGNOSIS — E1165 Type 2 diabetes mellitus with hyperglycemia: Secondary | ICD-10-CM

## 2021-05-09 DIAGNOSIS — E782 Mixed hyperlipidemia: Secondary | ICD-10-CM

## 2021-05-09 LAB — CBC
HCT: 37.6 % (ref 36.0–46.0)
Hemoglobin: 12.2 g/dL (ref 12.0–15.0)
MCH: 25.6 pg — ABNORMAL LOW (ref 26.0–34.0)
MCHC: 32.4 g/dL (ref 30.0–36.0)
MCV: 78.8 fL — ABNORMAL LOW (ref 80.0–100.0)
Platelets: 320 10*3/uL (ref 150–400)
RBC: 4.77 MIL/uL (ref 3.87–5.11)
RDW: 13.7 % (ref 11.5–15.5)
WBC: 8.2 10*3/uL (ref 4.0–10.5)
nRBC: 0 % (ref 0.0–0.2)

## 2021-05-09 LAB — LIPID PANEL
Cholesterol: 198 mg/dL (ref 0–200)
HDL: 35 mg/dL — ABNORMAL LOW (ref >40)
LDL Cholesterol: 119 mg/dL — ABNORMAL HIGH (ref 0–99)
Total CHOL/HDL Ratio: 5.7 RATIO
Triglycerides: 220 mg/dL — ABNORMAL HIGH (ref <150)
VLDL: 44 mg/dL — ABNORMAL HIGH (ref 0–40)

## 2021-05-09 LAB — MAGNESIUM: Magnesium: 2 mg/dL (ref 1.7–2.4)

## 2021-05-09 LAB — ECHOCARDIOGRAM COMPLETE
Height: 62 in
S' Lateral: 2.3 cm
Weight: 2807.78 oz

## 2021-05-09 LAB — GLUCOSE, CAPILLARY
Glucose-Capillary: 150 mg/dL — ABNORMAL HIGH (ref 70–99)
Glucose-Capillary: 283 mg/dL — ABNORMAL HIGH (ref 70–99)
Glucose-Capillary: 170 mg/dL — ABNORMAL HIGH (ref 70–99)
Glucose-Capillary: 261 mg/dL — ABNORMAL HIGH (ref 70–99)
Glucose-Capillary: 102 mg/dL — ABNORMAL HIGH (ref 70–99)

## 2021-05-09 LAB — RENAL FUNCTION PANEL
Albumin: 3.6 g/dL (ref 3.5–5.0)
Anion gap: 7 (ref 5–15)
BUN: 18 mg/dL (ref 8–23)
CO2: 25 mmol/L (ref 22–32)
Calcium: 8.9 mg/dL (ref 8.9–10.3)
Chloride: 103 mmol/L (ref 98–111)
Creatinine, Ser: 0.75 mg/dL (ref 0.44–1.00)
GFR, Estimated: >60 mL/min (ref >60)
Glucose, Bld: 262 mg/dL — ABNORMAL HIGH (ref 70–99)
Phosphorus: 3 mg/dL (ref 2.5–4.6)
Potassium: 4 mmol/L (ref 3.5–5.1)
Sodium: 135 mmol/L (ref 135–145)

## 2021-05-09 IMAGING — CT CT ANGIO HEAD
4 of 10 series · 17 of 47 positions shown · IV contrast (APPLIED)
Comparison: CT [DATE], correlation made with MRI brain
[DATE]

CLINICAL DATA: Acute stroke on MRI brain

EXAM:
CT ANGIOGRAPHY HEAD
TECHNIQUE: Multidetector CT imaging of the head was performed using the
standard protocol during bolus administration of intravenous
contrast. Multiplanar CT image reconstructions and MIPs were
obtained to evaluate the vascular anatomy.
CONTRAST:  75mL OMNIPAQUE IOHEXOL 350 MG/ML SOLN

[Series 5: cta head · axial · 0.39mm/px · z∈[-154,-54]mm · 6 of 72 slices shown]
[im 11/72  brain]
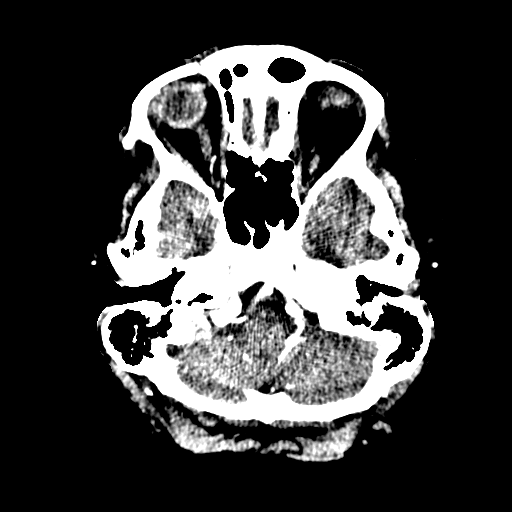
[im 21/72  bone]
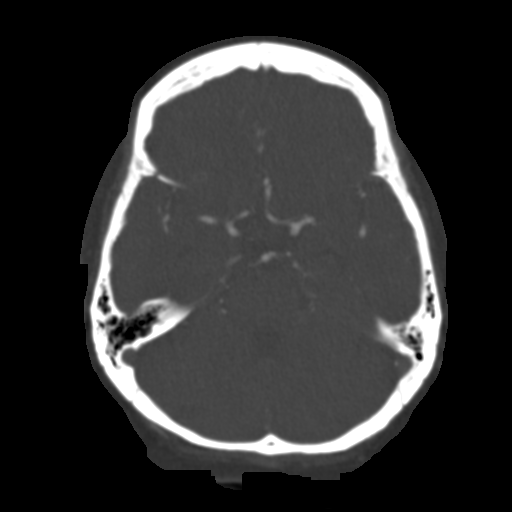
[im 31/72  brain]
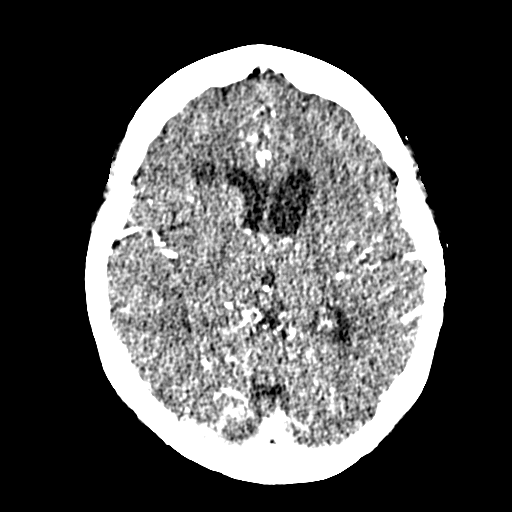
[im 41/72  bone]
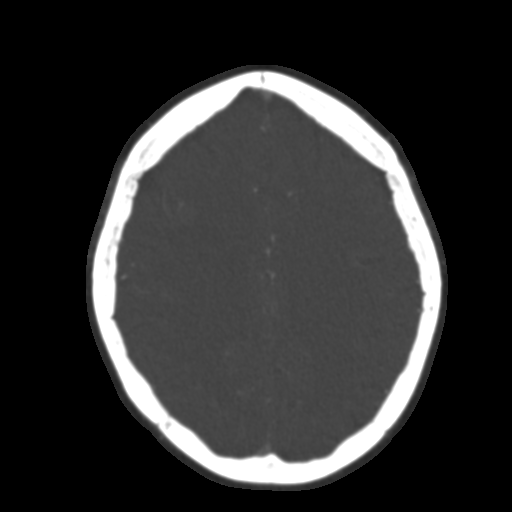
[im 51/72  brain]
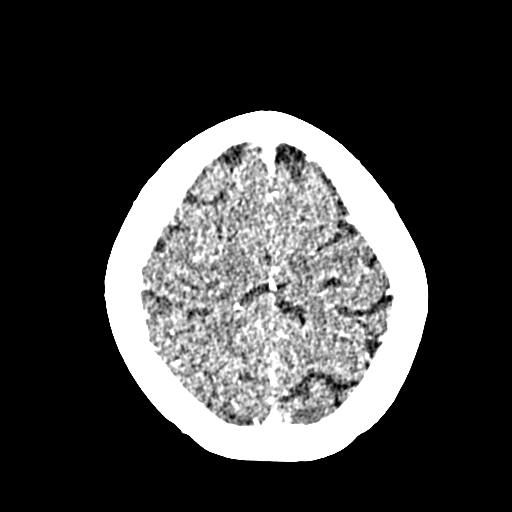
[im 61/72  bone]
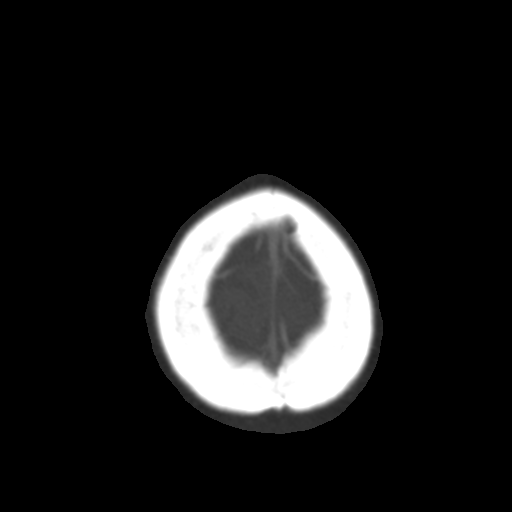

[Series 7: ax thin · axial · 0.35mm/px · z∈[-174,-89]mm · 6 of 142 slices shown]
[im 10/142  brain]
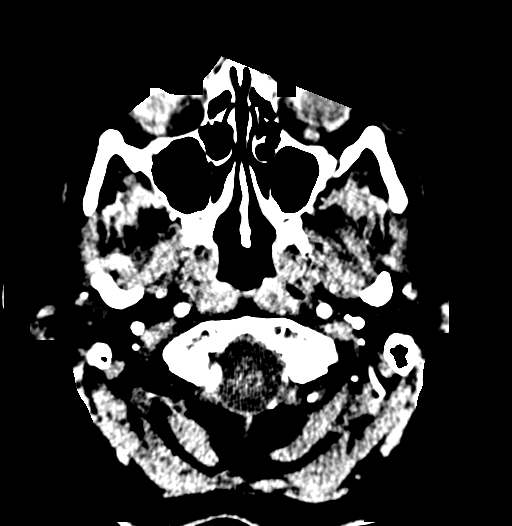
[im 29/142  brain]
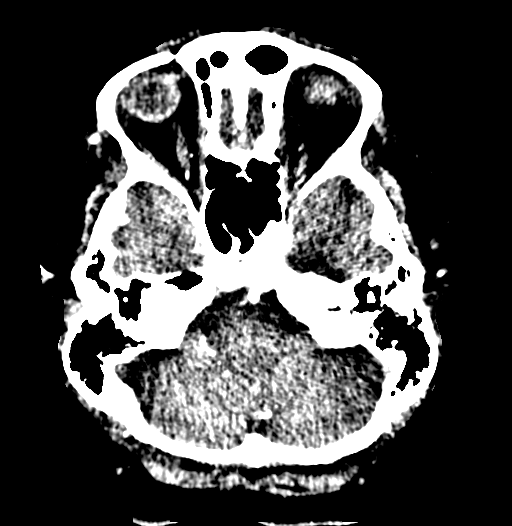
[im 48/142  brain]
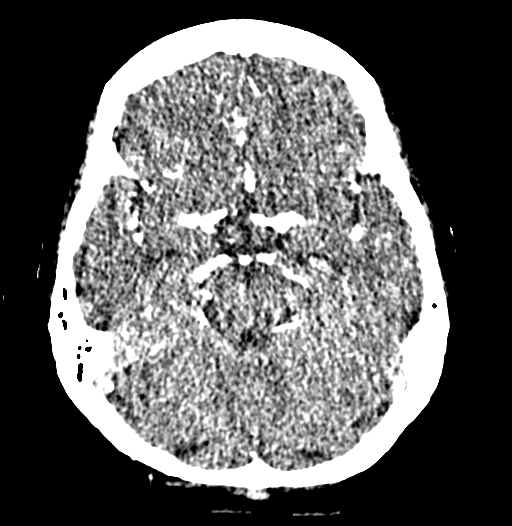
[im 66/142  brain]
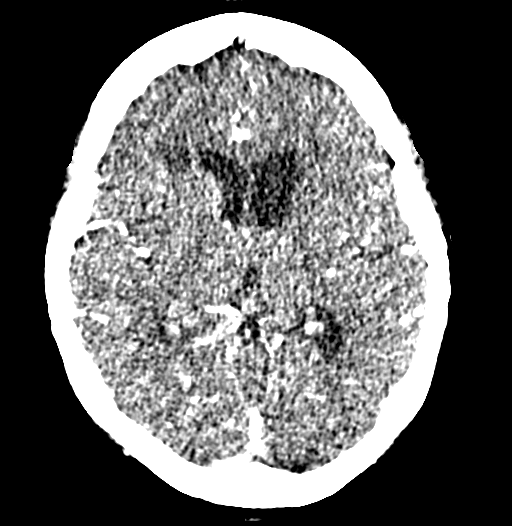
[im 76/142  brain]
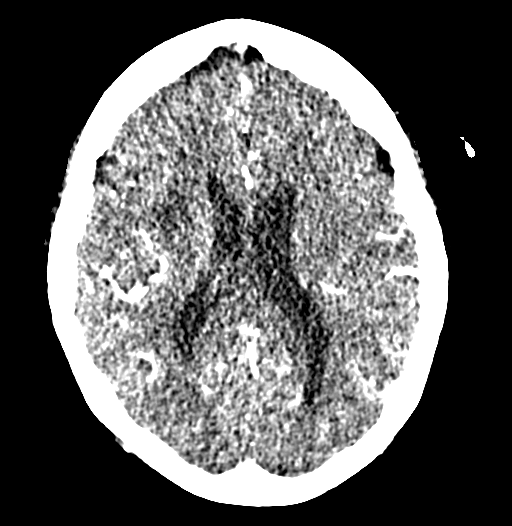
[im 95/142  brain]
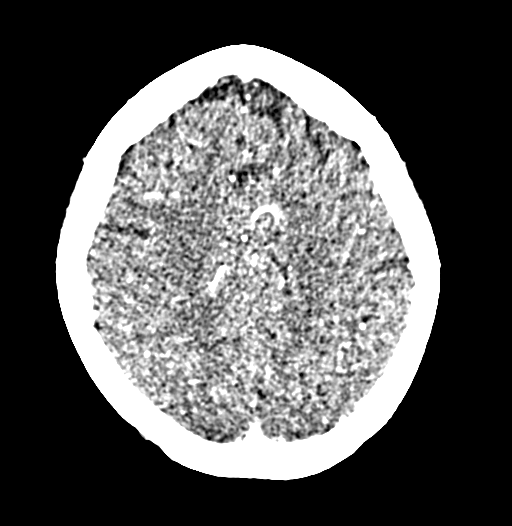

[Series 9: cor thin · coronal · 0.29mm/px · 3 of 184 slices shown]
[im 62/184  brain]
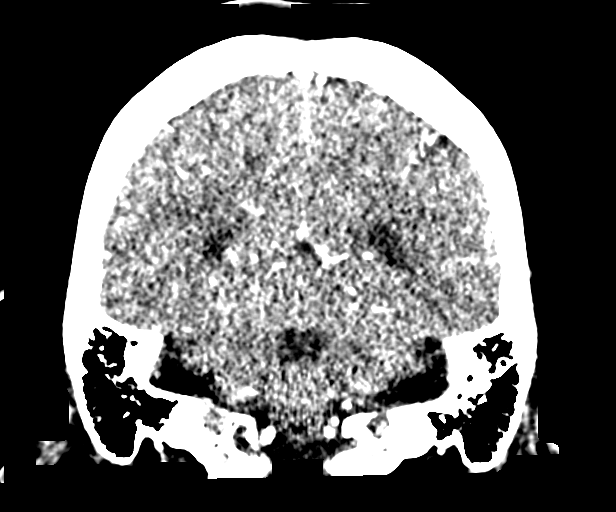
[im 92/184  brain]
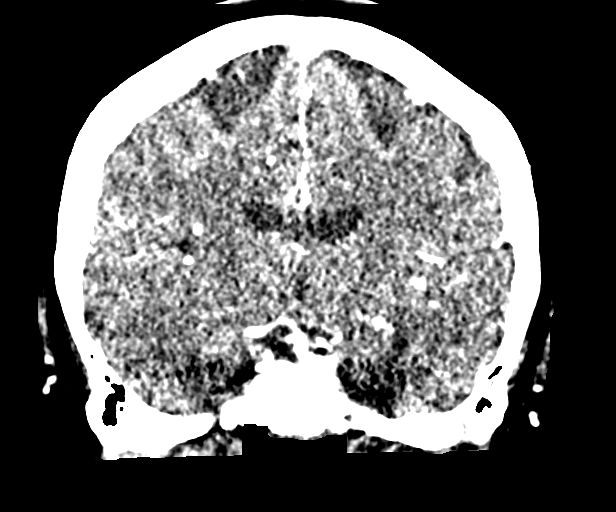
[im 123/184  brain]
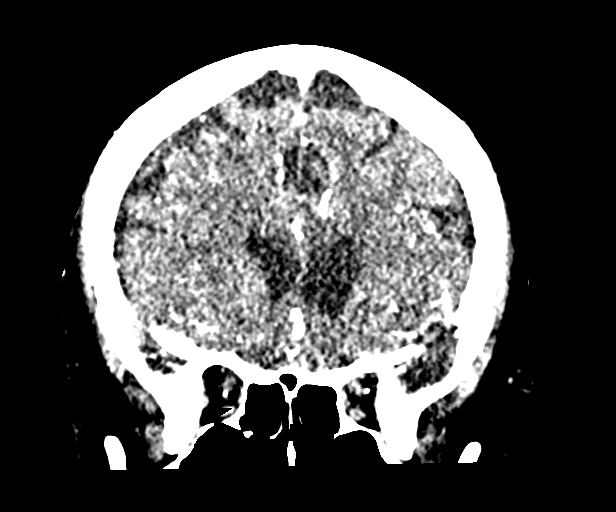

[Series 11: sag thin · sagittal · 0.29mm/px · 2 of 179 slices shown]
[im 60/179  brain]
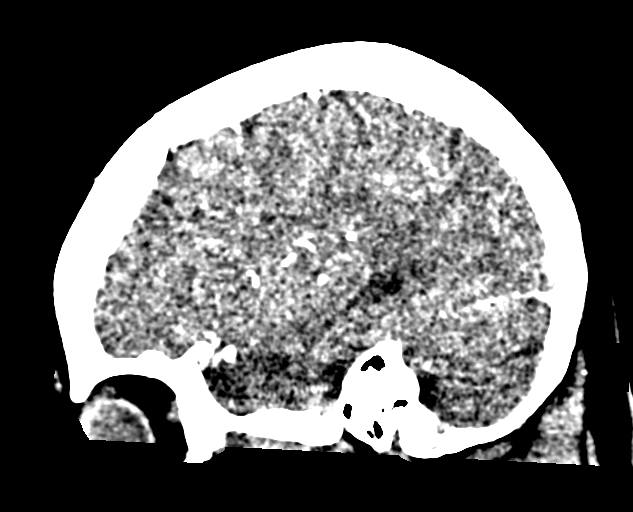
[im 119/179  brain]
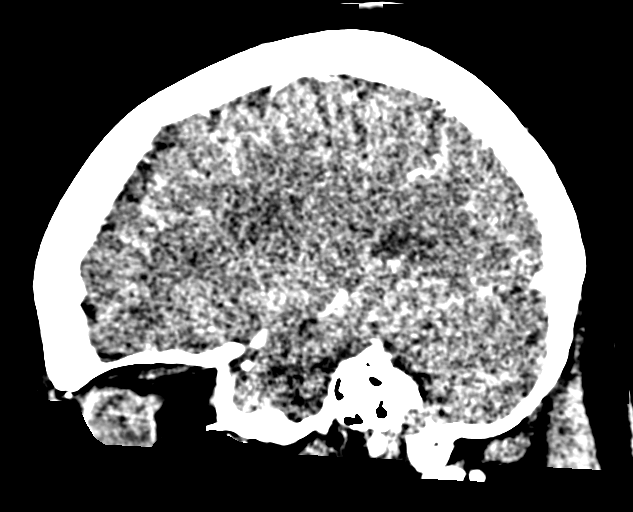

[17 of 47 positions shown; findings below may reference images not displayed]

FINDINGS: CT HEAD

Brain: Evolving recent infarction is again identified within the
right corona radiata. Small area of right frontal cortical
involvement on the MRI is not visible by CT. There is no acute
intracranial hemorrhage. No significant mass effect.

Chronic infarct of the left basal ganglia and adjacent white matter
with ex vacuo dilatation of the adjacent lateral ventricle. No
hydrocephalus. No extra-axial collection.

Vascular: There is atherosclerotic calcification at the skull base.

Skull: Calvarium is unremarkable.

Sinuses/Orbits: No acute finding.

Other: None.

CTA HEAD

Anterior circulation: Intracranial internal carotid arteries are
patent with calcified plaque but no significant stenosis. Anterior
cerebral arteries are patent. There is eccentric plaque at the
origin of the right MCA with marked stenosis. High-grade stenosis is
present at the right MCA bifurcation. Left MCA is patent. There is
moderate stenosis of an anterior division M2 branch.

Posterior circulation: Dominant intracranial right vertebral artery
is patent. Proximal intracranial left vertebral artery is irregular
with diminished enhancement. There is improved opacification more
distally beginning at the PICA origin, which may reflect retrograde
flow. Basilar artery is patent. Major cerebellar artery origins are
patent. A left posterior communicating artery is present. There is
irregularity of the left P2 PCA with moderate to marked stenosis.

Venous sinuses: As permitted by contrast timing, patent.

Review of the MIP images confirms the above findings.
IMPRESSION: Evolving recent infarction of the right corona radiata. No acute
intracranial hemorrhage.

Marked stenoses of the right MCA origin and bifurcation.

Moderate left M2 MCA branch stenosis.

Diffusely irregular left vertebral artery with diminished flow.
There is improvement beginning at the PICA origin likely from
retrograde flow.

Irregular left P2 PCA with moderate to marked stenosis.

## 2021-05-09 MED ORDER — LEVOTHYROXINE SODIUM 25 MCG PO TABS
125.0000 ug | ORAL_TABLET | Freq: Every day | ORAL | Status: DC
  Administered 2021-05-09 – 2021-05-10 (×2): 125 ug via ORAL
  Filled 2021-05-09 (×2): qty 1

## 2021-05-09 MED ORDER — IOHEXOL 350 MG/ML SOLN
75.0000 mL | Freq: Once | INTRAVENOUS | Status: AC | PRN
  Administered 2021-05-09: 75 mL via INTRAVENOUS

## 2021-05-09 MED ORDER — ATORVASTATIN CALCIUM 20 MG PO TABS
80.0000 mg | ORAL_TABLET | Freq: Every day | ORAL | Status: DC
  Administered 2021-05-10: 21:00:00 80 mg via ORAL
  Filled 2021-05-09: qty 4

## 2021-05-09 MED ORDER — INSULIN ASPART 100 UNIT/ML IJ SOLN
0.0000 [IU] | Freq: Three times a day (TID) | INTRAMUSCULAR | Status: DC
  Administered 2021-05-09: 8 [IU] via SUBCUTANEOUS
  Administered 2021-05-10: 13:00:00 5 [IU] via SUBCUTANEOUS
  Administered 2021-05-10: 10:00:00 8 [IU] via SUBCUTANEOUS
  Filled 2021-05-09 (×3): qty 1

## 2021-05-09 MED ORDER — INSULIN ASPART 100 UNIT/ML IJ SOLN
3.0000 [IU] | Freq: Three times a day (TID) | INTRAMUSCULAR | Status: DC
  Administered 2021-05-10 (×2): 3 [IU] via SUBCUTANEOUS
  Filled 2021-05-09 (×2): qty 1

## 2021-05-09 MED ORDER — HYDRALAZINE HCL 50 MG PO TABS
25.0000 mg | ORAL_TABLET | Freq: Four times a day (QID) | ORAL | Status: DC | PRN
  Administered 2021-05-09 – 2021-05-10 (×2): 25 mg via ORAL
  Filled 2021-05-09 (×2): qty 1

## 2021-05-09 MED ORDER — CLOPIDOGREL BISULFATE 75 MG PO TABS
300.0000 mg | ORAL_TABLET | Freq: Once | ORAL | Status: AC
  Administered 2021-05-09: 300 mg via ORAL
  Filled 2021-05-09: qty 4

## 2021-05-09 MED ORDER — INSULIN ASPART 100 UNIT/ML IJ SOLN
0.0000 [IU] | Freq: Every day | INTRAMUSCULAR | Status: DC
  Administered 2021-05-10: 2 [IU] via SUBCUTANEOUS
  Filled 2021-05-09: qty 1

## 2021-05-09 MED ORDER — CLOPIDOGREL BISULFATE 75 MG PO TABS
75.0000 mg | ORAL_TABLET | Freq: Every day | ORAL | Status: DC
  Administered 2021-05-10: 10:00:00 75 mg via ORAL
  Filled 2021-05-09: qty 1

## 2021-05-09 NOTE — Evaluation (Signed)
Physical Therapy Evaluation Patient Details Name: [name] MRN: [id number] DOB: [birth year] Today's Date: 05/09/2021   History of Present Illness  Pt is a 70 y.o. female seen in ed with complaints of slurred speech.  Patient states that her daughter called EMS to bring her to the hospital because her speech was not normal. MRI showed "Acute to early subacute right frontal lobe infarcts, Chronic left basal ganglia and left occipital infarcts." PMH of DM, HTN, skin cancer, CAD, CVA.   Clinical Impression  Patient alert, reported that her speech seems to be back to normal, reported 10/10 headache pain and very hungry, expressed several times wanting breakfast. PT confirmed with MD about previous NPO orders; MD reported that that was no longer required, and pt assisted with ordering breakfast. At baseline, pt lives alone, has family nearby, reported using a [facility] for ambulation.   Upon assessment the patient demonstrated symmetrical and strong UEs. RLE mildly weaker than LLE. Coordination of UE and LE, visual fields, and cranial nerves WFLs. The patient demonstrated several transfers with modI, and ambulated ~148ft with supervision, no AD and no LOB noted. She did report that her legs felt tired, fatigued. Overall the patient demonstrated near return to baseline level functioning, but would benefit from outpatient PT follow up to decrease risk of falls (per chart pt with fall ~3wks ago), and maximize strength and function.      Follow Up Recommendations Outpatient PT    Equipment Recommendations  None recommended by PT    Recommendations for Other Services       Precautions / Restrictions Precautions Precautions: Fall Restrictions Weight Bearing Restrictions: No      Mobility  Bed Mobility Overal bed mobility: Modified Independent                  Transfers Overall transfer level: Modified independent Equipment used: None             General transfer comment: pt  preferred at least unilateral UE support  Ambulation/Gait   Gait Distance (Feet): 180 Feet Assistive device: None Gait Pattern/deviations: WFL(Within Functional Limits) Gait velocity: decreased   General Gait Details: pt did report that her legs got tired  Stairs            Wheelchair Mobility    Modified Rankin (Stroke Patients Only)       Balance Overall balance assessment: Needs assistance Sitting-balance support: Feet supported Sitting balance-Leahy Scale: Normal Sitting balance - Comments: able to sit on BSC, perform pericare with supervision     Standing balance-Leahy Scale: Good                               Pertinent Vitals/Pain Pain Assessment: 0-10 Pain Score: 10-Worst pain ever Pain Location: headache Pain Descriptors / Indicators: Aching Pain Intervention(s): Limited activity within patient's tolerance;Monitored during session;Repositioned    Home Living Family/patient expects to be discharged to:: Private residence Living Arrangements: Alone Available Help at Discharge: Family;Available PRN/intermittently Type of Home: House Home Access: Stairs to enter Entrance Stairs-Rails: Can reach both;Right;Left Entrance Stairs-Number of Steps: 1 + 2 + 1 additional step Home Layout: One level Home Equipment: Cane - single point Additional Comments: 1 fall per chart three weeks ago    Prior Function Level of Independence: Independent         Comments: pt reported she does not drive, grocery shops, family lives nearby     [place]  Dominant Hand: Right    Extremity/Trunk Assessment   Upper Extremity Assessment Upper Extremity Assessment: RUE deficits/detail;LUE deficits/detail RUE Deficits / Details: grossly 4/5 RUE Coordination: WNL LUE Deficits / Details: grossly 4/5 LUE Coordination: WNL    Lower Extremity Assessment Lower Extremity Assessment: RLE deficits/detail;LLE deficits/detail RLE Deficits / Details: 3+/5  grossly RLE Coordination: WNL LLE Deficits / Details: 4/5 LLE Coordination: WNL    Cervical / Trunk Assessment Cervical / Trunk Assessment: Normal  Communication   Communication: No difficulties  Cognition Arousal/Alertness: Awake/alert Behavior During Therapy: WFL for tasks assessed/performed Overall Cognitive Status: Within Functional Limits for tasks assessed                                        General Comments      Exercises Other Exercises Other Exercises: Extended time spent on standard commode due to pt having a BM. able to demonstrate good balance, perform pericare with supervision/modI. washed hands at sink as well   Assessment/Plan    PT Assessment Patient needs continued PT services  PT Problem List Decreased strength;Decreased activity tolerance;Decreased balance       PT Treatment Interventions DME instruction;Balance training;Gait training;Neuromuscular re-education;Stair training;Functional mobility training;Patient/family education;Therapeutic activities;Therapeutic exercise    PT Goals (Current goals can be found in the Care Plan section)  Acute Rehab PT Goals Patient Stated Goal: to eat, and go home PT Goal Formulation: With patient Time For Goal Achievement: 05/23/21 Potential to Achieve Goals: Good    Frequency Min 2X/week   Barriers to discharge        Co-evaluation               AM-PAC PT "6 Clicks" Mobility  Outcome Measure Help needed turning from your back to your side while in a flat bed without using bedrails?: None Help needed moving from lying on your back to sitting on the side of a flat bed without using bedrails?: None Help needed moving to and from a bed to a chair (including a wheelchair)?: None Help needed standing up from a chair using your arms (e.g., wheelchair or bedside chair)?: None Help needed to walk in hospital room?: None Help needed climbing 3-5 steps with a railing? : None 6 Click Score:  24    End of Session   Activity Tolerance: Patient tolerated treatment well Patient left: in chair;with call bell/phone within reach;with chair alarm set Nurse Communication: Mobility status PT Visit Diagnosis: Other abnormalities of gait and mobility (R26.89);History of falling (Z91.81)    Time: 3267-1245 PT Time Calculation (min) (ACUTE ONLY): 36 min   Charges:   PT Evaluation $PT Eval Low Complexity: 1 Low PT Treatments $Therapeutic Activity: 23-37 mins       Lieutenant Diego PT, DPT 10:55 AM,05/09/21

## 2021-05-09 NOTE — Consult Note (Signed)
Neurology Consultation Reason for Consult: Acute stroke on MRI Requesting Physician: Wendee Beavers  CC: Slurred speech  History is obtained from: Patient, and chart review --attempted to call daughter at 2:50 and 3 PM but there was no answer  HPI: Madison Davidson is a 70 y.o. 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  Patient reports that she lives independently and manages all of her activities of daily living as well as IADLs.  She reports that approximately 3 weeks ago she was feeding outdoor cats when she suddenly fell with no explanation and lost consciousness for moments.  She had some left leg numbness that she noticed after that which she feels lasted only minutes but denies noting any other neurological symptoms.  She reports her blood glucose typically runs 130s to 140s in the morning, 200s after eating breakfast.  She does not typically take her blood pressure at home.  Since the fall she has been noticing increased fatigue  Notably blood pressures have been labile, 170s/60s on initial presentation up to max of 230/83 at 2300 on 6/13 with a reading of 149/64 7 AM this morning  She previously followed with Dr. Melrose Nakayama for tension headaches, last seen in January 2019, with symptoms well controlled on nortriptyline 10 nightly with as needed Fioricet.  Neurological examination at the time was notable for only very mildly positive Romberg sign  LKW: 2 days prior to admission (6/11) tPA given?: No, out of the window Premorbid modified rankin scale: 0-1     0 - No symptoms.     1 - No significant disability. Able to carry out all usual activities, despite some symptoms.  ROS: All other review of systems was negative except as noted in the HPI.   Past Medical History:  Diagnosis  Date   Cancer ([place])    skin   Coronary artery disease    Diabetes mellitus without complication ([place])    Hypertension    Stroke [facility])    Past Surgical History:  Procedure Laterality Date   ABDOMINAL HYSTERECTOMY       Family History  Problem Relation Age of Onset   Heart disease Mother    Heart disease Father    Current Outpatient Medications  Medication Instructions   aspirin EC 81 mg, Oral, As needed   atorvastatin (LIPITOR) 40 mg, Oral, Daily, Takes around 4pm    celecoxib (CELEBREX) 200 mg, Oral, Daily   citalopram (CELEXA) 10 mg, Oral, Daily   diltiazem (DILACOR XR) 120 mg, Oral, Daily   fexofenadine (ALLEGRA) 180 mg, Oral, Daily, Takes 1/2 tablet daily    glipiZIDE (GLUCOTROL) 10 mg, Oral, 2 times daily   levothyroxine (SYNTHROID) 100 mcg, Oral, Daily   metFORMIN (GLUCOPHAGE) 1,000 mg, Oral, 2 times daily with meals   nortriptyline (PAMELOR) 10 mg, Oral, Daily at bedtime   saxagliptin HCl (ONGLYZA) 5 mg, Oral, Daily     Social History:  reports that she has never smoked. She has never used smokeless tobacco. She reports that she does not drink alcohol and does not use drugs.   Exam: Current vital signs: BP (!) 179/56 (BP Location: Left Arm)   Pulse 73   Temp 97.9 F (36.6 C)   Resp 20   Ht 5\' 2"  (1.575 m)   Wt 79.6 kg   SpO2 97%   BMI 32.10 kg/m  Vital signs in last 24 hours: Temp:  [97.5 F (36.4 C)-98.1 F (36.7 C)] 97.9 F (36.6 C) (06/14 1107) Pulse Rate:  [69-83] 73 (06/14 1107) Resp:  [13-23] 20 (06/14 1107) BP: (116-230)/(56-97) 179/56 (06/14 1107) SpO2:  [97 %-100 %] 97 % (06/14 1107) Weight:  [79.6 kg-83.9 kg] 79.6 kg (06/13 2048)   Physical Exam  Constitutional: Comfortable, obese Psych: Affect somewhat flat but calm and cooperative Eyes: No scleral injection HENT: No oropharyngeal obstruction.  MSK: no joint deformities.  Cardiovascular: Normal rate and regular rhythm.  Respiratory: Effort normal, non-labored breathing but somewhat  short of breath with activity GI: Soft.  No distension. There is no tenderness.  Skin: Warm dry and intact visible skin  Neuro: Mental Status: Patient is awake, alert, oriented to person, place, month, year, and situation. Patient is able to give a clear and coherent history. No signs of aphasia or neglect, mild slurring of the speech Cranial Nerves: II: Visual Fields are full. Pupils are equal, round, and reactive to light.   III,IV, VI: EOMI without ptosis or diploplia.  V: Facial sensation reduced sensation on the left V2 and V3 distribution VII: Facial movement is symmetric.  VIII: hearing is intact to voice X: Uvula elevates symmetrically XI: Shoulder shrug is symmetric. XII: tongue is midline without atrophy or fasciculations.  Motor: Tone is normal. Bulk is normal. 5/5 strength was present in all four extremities other than left hip flexion 4/5 Sensory: Sensation is symmetric to light touch and temperature in the arms and legs. Deep Tendon Reflexes: 3+ and symmetric in the biceps and patellae.  Plantars: Toes are downgoing on the right and upgoing on the left Cerebellar: FNF and HKS are intact bilaterally  NIHSS total 1 Score breakdown: One-point for mild speech slurring     I have reviewed labs in epic and the results pertinent to this consultation are:  BMP notable for glucose of 232 on arrival, creatinine 1.07 (GFR 56) CBC notable for mild normocytic anemia with hemoglobin of 11.7 COVID/flu panel negative UA with trace leukocytes and rare bacteria  Lab Results  Component Value Date   CHOL 198 05/09/2021   HDL 35 (L) 05/09/2021   LDLCALC 119 (H) 05/09/2021   TRIG 220 (H) 05/09/2021   CHOLHDL 5.7 05/09/2021   No results found for: HGBA1C     I have reviewed the images obtained: MRI brain personally reviewed with diffusion restriction in the right corona radiata, as well as more laterally in the right frontal cortex concerning for subacute infarction,  somewhat embolic in appearance CTA personally reviewed with a severe right M1 origin and bifurcation stenosis, likely the culprit lesion, as well as other scattered intracranial atherosclerosis (moderate left M2, irregular left vertebral artery, irregular left P2 PCA) Echocardiogram completed, read pending Carotid ultrasound: IMPRESSION: Color duplex indicates moderate heterogeneous plaque with no hemodynamically significant stenosis by duplex criteria in the extracranial cerebrovascular circulation. Left vertebral artery waveform not identified.  Impression: This is a 70 year old woman with past medical history of multiple small vessel risk factors as above presenting with slurred speech likely in the setting of hypertensive emergency.  Suspect that her stroke happened 3 weeks ago when she initially fell based on appearance on imaging as well as symptoms at the time.  She has some mild residual left leg weakness and left facial numbness.  Given severe intracranial atherosclerosis disease including a likely culprit lesion, she qualifies for 90 days of dual antiplatelet therapy which I will commence.  Also recommend  30-day event monitor on an outpatient basis  Recommendations: -Hemoglobin A1c goal less than 7%, can be followed up by primary care physician with medication adjustments as needed -Weight loss and exercise counseling -Plavix 300 mg once followed by 75 mg daily for 90 days -Continue aspirin 81 mg daily lifelong -LDL 119, increase atorvastatin to 80 mg nightly and consider Repatha or other PSK 9 inhibitor on an outpatient basis if LDL remains elevated.  Also diet and exercise counseling --patient counseled to consumption of bacon and other meat -Appreciate PT/OT evaluation -30-day event monitor on an outpatient basis -Given body habitus, stroke and somnolence consider outpatient sleep study -Given the severity of her intracranial atherosclerotic disease, would allow more permissive  blood pressure at this time, SBP goal 140-180 with close monitoring of symptoms and further gradual lowering on an outpatient basis  -Initiation of antihypertensive agent per primary team, per cardiology notes she is not currently on any agents but is planned for potential ARB initiation -Outpatient follow-up with neurology -Neurology will follow up echocardiogram read, otherwise will be available on an as-needed basis. Please reach out if new neurological questions arise  Lesleigh Noe MD-PhD [facility] [phone] [facility] coverage for [facility] is from 8 AM to 4 AM in-house and 4 PM to 8 PM by telephone/video. 8 PM to 8 AM emergent questions or overnight urgent questions should be addressed to Teleneurology On-call or [facility] neurohospitalist; contact information can be found on AMION

## 2021-05-09 NOTE — Progress Notes (Signed)
*  PRELIMINARY RESULTS* Echocardiogram 2D Echocardiogram has been performed.  Sherrie Sport 05/09/2021, 12:28 PM

## 2021-05-09 NOTE — Progress Notes (Signed)
SLP Cancellation Note  Patient Details Name: Madison Davidson MRN: [id number] DOB: [birth year]   Cancelled treatment:       Reason Eval/Treat Not Completed:  (SLP Screened today; chart reviewed). Met w/ both pt and Daughter in room this morning. Pt was resting stating she had "not slept" last night; she was scrunched down in the bed w/ chin tucked. Pt is Edentulous at baseline.  Both Daughter and pt stated speech has improved since being at home prior to admit. MD noted "resolution" in the ED during discussion w/ pt then. Pt verbally communicated during screening stated she had ordered lunch/supper meals "when they called and asked me what I wanted". Pt interjected intermittently during conversation w/ Dtr; speech was intelligible each time though being Edentulous, articulation is slightly reduced at Baseline.  Pt and Dtr denied any swallowing deficits w/ meals; pt is on a Regular diet as at Baseline at home. NSG reported toleration of swallowing Pills. Discussed general aspiration precautions especially while eating in bed. No further skilled ST services indicated currently as pt appears very close/at her Baseline for speech especially w/ Edentulous Baseline and not having slept well in recent 24 hours. Recommended pt rest, sit fully upright when talking w/ others, and reduce distractions in environment during conversation w/ others. Pt to f/u w/ PCP post discharge home and if any concerns re: speech intelligibility, then PCP will refer pt to Outpatient ST services if needed. Daughter and pt agreed. NSG to reconsult if any change in status while admitted.       Orinda Kenner, MS, CCC-SLP Speech Language Pathologist [facility] [phone] [facility] 05/09/2021, 4:10 PM

## 2021-05-09 NOTE — Progress Notes (Signed)
PROGRESS NOTE  Madison Davidson OVF:[id number] DOB: [birth year]   PCP: [facility]  Patient is from: Home.  Lives alone independently  DOA: 05/08/2021 LOS: 1  Chief complaints: Slurred speech  Brief Narrative / Interim history: 70 year old female with history of NIDDM-2, CAD, CVA and HTN brought to ED by EMS with slurred speech that has started 2 days prior to arrival.  Reportedly, had a fall 3 weeks prior, and has been week.  She is on aspirin but takes sporadically.  In ED, hypotensive with systolic in 841Y.  Glucose elevated but not in DKA or HHS.  CT showed ill-defined hypodensity in right frontal white matter and chronic left basal ganglia infarct.  MRI brain showed acute to early subacute right frontal lobe infarct, chronic left BG and left occipital infarcts.  Patient was admitted for further CVA evaluation.   Subjective: Seen and examined earlier this morning.  No major events overnight of this morning.  She has no complaints, just hungry.  She denies headache, vision change, chest pain, dyspnea, palpitation, GI or UTI symptoms.  She denies focal weakness or numbness.  Patient's daughter at bedside reports some left-sided facial drooping 2 days ago.  She thinks her speech is back to baseline.  Objective: Vitals:   05/09/21 0058 05/09/21 0654 05/09/21 0738 05/09/21 1107  BP: (!) 194/76 (!) 184/73 (!) 149/64 (!) 179/56  Pulse:  72 72 73  Resp:  18 18 20   Temp:  97.8 F (36.6 C) (!) 97.5 F (36.4 C) 97.9 F (36.6 C)  TempSrc:  Oral Oral   SpO2:  98% 98% 97%  Weight:      Height:        Intake/Output Summary (Last 24 hours) at 05/09/2021 1237 Last data filed at 05/09/2021 0718 Gross per 24 hour  Intake 350.91 ml  Output --  Net 350.91 ml   Filed Weights   05/08/21 1715 05/08/21 2048  Weight: 83.9 kg 79.6 kg    Examination:  GENERAL: No apparent distress.  Nontoxic. HEENT: MMM.  Vision and hearing grossly intact.  NECK: Supple.  No apparent JVD.   RESP: On RA.  No IWOB.  Fair aeration bilaterally. CVS:  RRR. Heart sounds normal.  ABD/GI/GU: BS+. Abd soft, NTND.  MSK/EXT:  Moves extremities. No apparent deformity. No edema.  SKIN: no apparent skin lesion or wound NEURO: Awake, alert and oriented appropriately.  Seems to have some residual dysarthria.  No apparent focal neuro deficit. PSYCH: somewhat emotional and tearful  Procedures:  None  Microbiology summarized: COVID-19 and influenza PCR nonreactive.  Assessment & Plan: Acute/early subacute ischemic CVA: MRI brain showed right frontal acute to severely subacute infarct with chronic left BG and left occipital infarct.  Patient had left facial droop and slurred speech on presentation the symptoms have resolved.  Symptomatic for over 2 days.  She was very hypertensive on presentation.  She sporadically takes aspirin.  Carotid ultrasound without significant flow obstructing stenosis.  LDL 119. -Neurology consulted. -She may need CTA head to evaluate for intracranial stenosis -Follow echocardiogram -Continue low-dose aspirin and high intensity atorvastatin -PT/OT/SLP eval -Permissive hypertension?   Hypertensive emergency: SBP as high as 230 last night.  Improved. -She may need permissive hypertension -Hold home Cardizem  NIDDM-2 with hyperglycemia and hyperlipidemia: last CBG is postprandial. Recent Labs  Lab 05/08/21 2140 05/09/21 0738 05/09/21 1107  GLUCAP 129* 150* 283*  -Increase SSI to moderate -Add NovoLog 3 units 3 times daily with meals -Continue statin as above -  Follow hemoglobin A1c   History of CAD: No anginal symptoms. -Aspirin and statin as above -Follow echocardiogram.  Mood disorder: Stable. -Continue home medications-citalopram and nortriptyline   Body mass index is 32.1 kg/m.         DVT prophylaxis:  heparin injection 5,000 Units Start: 05/08/21 2200 SCD's Start: 05/08/21 1910  Code Status: Full code Family Communication: Patient  and/or RN.  Updated patient's daughter at the bedside. Level of care: Med-Surg Status is: Inpatient  Remains inpatient appropriate because:Hemodynamically unstable, Ongoing diagnostic testing needed not appropriate for outpatient work up, and Inpatient level of care appropriate due to severity of illness  Dispo: The patient is from: Home              Anticipated d/c is to: Home              Patient currently is not medically stable to d/c.   Difficult to place patient No       Consultants:  Neurology   Sch Meds:  Scheduled Meds:  aspirin  81 mg Oral Once   aspirin EC  81 mg Oral Daily   atorvastatin  80 mg Oral Daily   citalopram  10 mg Oral Daily   heparin  5,000 Units Subcutaneous Q8H   insulin aspart  0-15 Units Subcutaneous TID WC   insulin aspart  0-5 Units Subcutaneous QHS   insulin aspart  3 Units Subcutaneous TID WC   levothyroxine  125 mcg Oral Q0600   nortriptyline  10 mg Oral QHS   Continuous Infusions:  sodium chloride 10 mL/hr at 05/09/21 0718   PRN Meds:.acetaminophen **OR** acetaminophen (TYLENOL) oral liquid 160 mg/5 mL **OR** acetaminophen, hydrALAZINE  Antimicrobials: Anti-infectives (From admission, onward)    None        I have personally reviewed the following labs and images: CBC: Recent Labs  Lab 05/08/21 1258  WBC 9.0  HGB 11.7*  HCT 37.0  MCV 79.1*  PLT 309   BMP &GFR Recent Labs  Lab 05/08/21 1258  NA 137  K 4.0  CL 104  CO2 22  GLUCOSE 232*  BUN 23  CREATININE 1.07*  CALCIUM 9.2   Estimated Creatinine Clearance: 47.1 mL/min (A) (by C-G formula based on SCr of 1.07 mg/dL (H)). Liver & Pancreas: No results for input(s): AST, ALT, ALKPHOS, BILITOT, PROT, ALBUMIN in the last 168 hours. No results for input(s): LIPASE, AMYLASE in the last 168 hours. No results for input(s): AMMONIA in the last 168 hours. Diabetic: No results for input(s): HGBA1C in the last 72 hours. Recent Labs  Lab 05/08/21 2140 05/09/21 0738  05/09/21 1107  GLUCAP 129* 150* 283*   Cardiac Enzymes: No results for input(s): CKTOTAL, CKMB, CKMBINDEX, TROPONINI in the last 168 hours. No results for input(s): PROBNP in the last 8760 hours. Coagulation Profile: No results for input(s): INR, PROTIME in the last 168 hours. Thyroid Function Tests: No results for input(s): TSH, T4TOTAL, FREET4, T3FREE, THYROIDAB in the last 72 hours. Lipid Profile: Recent Labs    05/09/21 0519  CHOL 198  HDL 35*  LDLCALC 119*  TRIG 220*  CHOLHDL 5.7   Anemia Panel: No results for input(s): VITAMINB12, FOLATE, FERRITIN, TIBC, IRON, RETICCTPCT in the last 72 hours. Urine analysis:    Component Value Date/Time   COLORURINE YELLOW (A) 05/08/2021 1515   APPEARANCEUR HAZY (A) 05/08/2021 1515   LABSPEC 1.025 05/08/2021 1515   PHURINE 5.0 05/08/2021 1515   GLUCOSEU 50 (A) 05/08/2021 1515   HGBUR  NEGATIVE 05/08/2021 1515   BILIRUBINUR NEGATIVE 05/08/2021 1515   KETONESUR 5 (A) 05/08/2021 1515   PROTEINUR NEGATIVE 05/08/2021 1515   NITRITE NEGATIVE 05/08/2021 1515   LEUKOCYTESUR TRACE (A) 05/08/2021 1515   Sepsis Labs: Invalid input(s): PROCALCITONIN, [place]  Microbiology: Recent Results (from the past 240 hour(s))  Resp Panel by RT-PCR (Flu A&B, Covid) Nasopharyngeal Swab     Status: None   Collection Time: 05/08/21  2:19 PM   Specimen: Nasopharyngeal Swab; Nasopharyngeal(NP) swabs in vial transport medium  Result Value Ref Range Status   SARS Coronavirus 2 by RT PCR NEGATIVE NEGATIVE Final    Comment: (NOTE) SARS-CoV-2 target nucleic acids are NOT DETECTED.  The SARS-CoV-2 RNA is generally detectable in upper respiratory specimens during the acute phase of infection. The lowest concentration of SARS-CoV-2 viral copies this assay can detect is 138 copies/mL. A negative result does not preclude SARS-Cov-2 infection and should not be used as the sole basis for treatment or other patient management decisions. A negative result may  occur with  improper specimen collection/handling, submission of specimen other than nasopharyngeal swab, presence of viral mutation(s) within the areas targeted by this assay, and inadequate number of viral copies(<138 copies/mL). A negative result must be combined with clinical observations, patient history, and epidemiological information. The expected result is Negative.  Fact Sheet for Patients:  EntrepreneurPulse.com.au  Fact Sheet for Healthcare Providers:  IncredibleEmployment.be  This test is no t yet approved or cleared by the [place] FDA and  has been authorized for detection and/or diagnosis of SARS-CoV-2 by FDA under an Emergency Use Authorization (EUA). This EUA will remain  in effect (meaning this test can be used) for the duration of the COVID-19 declaration under Section 564(b)(1) of the Act, 21 U.S.C.section 360bbb-3(b)(1), unless the authorization is terminated  or revoked sooner.       Influenza A by PCR NEGATIVE NEGATIVE Final   Influenza B by PCR NEGATIVE NEGATIVE Final    Comment: (NOTE) The Xpert Xpress SARS-CoV-2/FLU/RSV plus assay is intended as an aid in the diagnosis of influenza from Nasopharyngeal swab specimens and should not be used as a sole basis for treatment. Nasal washings and aspirates are unacceptable for Xpert Xpress SARS-CoV-2/FLU/RSV testing.  Fact Sheet for Patients: EntrepreneurPulse.com.au  Fact Sheet for Healthcare Providers: IncredibleEmployment.be  This test is not yet approved or cleared by the [place] FDA and has been authorized for detection and/or diagnosis of SARS-CoV-2 by FDA under an Emergency Use Authorization (EUA). This EUA will remain in effect (meaning this test can be used) for the duration of the COVID-19 declaration under Section 564(b)(1) of the Act, 21 U.S.C. section 360bbb-3(b)(1), unless the authorization is terminated  or revoked.  Performed at [facility], [address] [place], [place] [address]     Radiology Studies: CT Head Wo Contrast  Result Date: 05/08/2021 CLINICAL DATA:  TIA.  Weakness and left knee pain EXAM: CT HEAD WITHOUT CONTRAST TECHNIQUE: Contiguous axial images were obtained from the base of the skull through the vertex without intravenous contrast. COMPARISON:  CT head 09/13/2017 FINDINGS: Brain: Ventricle size and cerebral volume normal for age. Ill-defined hypodensities in the right frontal white matter are new since the prior study. These are compatible with infarct of indeterminate age, possibly subacute. Well-defined hypodensity left basal ganglia and internal capsule anteriorly compatible with chronic infarct. This was not present previously. Negative for hemorrhage or mass. Vascular: Negative for hyperdense vessel Skull: Negative Sinuses/Orbits: Negative Other: None IMPRESSION: Ill-defined hypodensities  right frontal white matter compatible with ischemia of indeterminate age. Possible subacute Chronic infarct left basal ganglia No acute hemorrhage. Electronically Signed   By: Franchot Gallo M.D.   On: 05/08/2021 13:39   MR BRAIN WO CONTRAST  Result Date: 05/08/2021 CLINICAL DATA:  Weakness. EXAM: MRI HEAD WITHOUT CONTRAST TECHNIQUE: Multiplanar, multiecho pulse sequences of the brain and surrounding structures were obtained without intravenous contrast. COMPARISON:  Head CT 05/08/2021 FINDINGS: Brain: There are acute to early subacute infarcts involving white matter in the right frontal lobe primarily at the level of the corona radiata, and there is also a single subcentimeter acute or early subacute cortical infarct laterally in the right frontal lobe. There is a chronic infarct in the left basal ganglia with associated chronic blood products. There is also a small chronic cortical infarct in the posteroinferior left occipital lobe. Mild cerebral atrophy is within normal limits  for age. No mass, midline shift, or extra-axial fluid collection is evident. Vascular: Major intracranial vascular flow voids are preserved. Skull and upper cervical spine: Unremarkable bone marrow signal. Sinuses/Orbits: Unremarkable orbits. Paranasal sinuses and mastoid air cells are clear. Other: None. IMPRESSION: 1. Acute to early subacute right frontal lobe infarcts. 2. 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 Electronically Signed   By: Logan Bores M.D.   On: 05/08/2021 16:58   US Carotid Bilateral (at [facility] and [facility] only)  Result Date: 05/09/2021 CLINICAL DATA:  70 year old female with a history of CVA EXAM: BILATERAL CAROTID DUPLEX ULTRASOUND TECHNIQUE: Pearline Cables scale imaging, color Doppler and duplex ultrasound were performed of bilateral carotid and vertebral arteries in the neck. COMPARISON:  None. FINDINGS: Criteria: Quantification of carotid stenosis is based on velocity parameters that correlate the residual internal carotid diameter with NASCET-based stenosis levels, using the diameter of the distal internal carotid lumen as the denominator for stenosis measurement. The following velocity measurements were obtained: RIGHT ICA:  Systolic 902 cm/sec, Diastolic 19 cm/sec CCA:  409 cm/sec SYSTOLIC ICA/CCA RATIO:  1.5 ECA:  128 cm/sec LEFT ICA:  Systolic 95 cm/sec, Diastolic 11 cm/sec CCA:  84 cm/sec SYSTOLIC ICA/CCA RATIO:  1.1 ECA:  157 cm/sec Right Brachial SBP: Not acquired Left Brachial SBP: Not acquired RIGHT CAROTID ARTERY: No significant calcified disease of the right common carotid artery. Intermediate waveform maintained. Heterogeneous plaque without significant calcifications at the right carotid bifurcation. Low resistance waveform of the right ICA. No significant tortuosity. RIGHT VERTEBRAL ARTERY: Antegrade flow with low resistance waveform. LEFT CAROTID ARTERY: No significant calcified disease of the left common carotid artery. Intermediate waveform maintained. Heterogeneous  plaque at the left carotid bifurcation without significant calcifications. Low resistance waveform of the left ICA. LEFT VERTEBRAL ARTERY: Left vertebral artery not identified. IMPRESSION: Color duplex indicates moderate heterogeneous plaque with no hemodynamically significant stenosis by duplex criteria in the extracranial cerebrovascular circulation. Left vertebral artery waveform not identified. Signed, Dulcy Fanny. Dellia Nims, RPVI [facility] Radiology Electronically Signed   By: Corrie Mckusick D.O.   On: 05/09/2021 08:19   DG Knee Complete 4 Views Left  Result Date: 05/08/2021 CLINICAL DATA:  Recent fall with left knee pain, initial encounter EXAM: LEFT KNEE - COMPLETE 4+ VIEW COMPARISON:  None. FINDINGS: No evidence of fracture, dislocation, or joint effusion. No evidence of arthropathy or other focal bone abnormality. Soft tissues are unremarkable. IMPRESSION: No acute abnormality noted. Electronically Signed   By: Inez Catalina M.D.   On: 05/08/2021 15:32      Shawndrea Rutkowski T.  Wood  If 7PM-7AM, please contact night-coverage [email or web address] 05/09/2021, 12:37 PM

## 2021-05-09 NOTE — Evaluation (Signed)
Occupational Therapy Evaluation Patient Details Name: Madison Davidson MRN: [id number] DOB: 1951-05-07 Today's Date: 05/09/2021    History of Present Illness Pt is a 70 y.o. female that presented to the ED for complaints of slurred speech.  Patient states that her daughter called EMS to bring her to the hospital because her speech was not normal. MRI showed "Acute to early subacute right frontal lobe infarcts, Chronic left basal ganglia and left occipital infarcts." PMH of DM, HTN, skin cancer, CAD, CVA.   Clinical Impression   Pt was in bathroom when therapist arrived; she had walked there INDly and w/o AD. Able to perform toileting, hygiene, grooming, dressing, transfers with Mod I, requiring slightly increased time. Pt reports 3 falls in the previous year -- 1 [place] the [place], 1 while getting out of the bathtub. Each time, pt was able to get up on her own. Strength and ROM in all  extremities WFL, slightly weaker on L side. Pt performs all ADL/IADL INDly. 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 Pt is at or near her baseline presently, and with headache resolving. Recommend pt consider regular use of SPC and receive outpt OT to work on balance, given her falls hx.     Follow Up Recommendations  Outpatient OT    Equipment Recommendations       Recommendations for Other Services       Precautions / Restrictions Precautions Precautions: Fall Restrictions Weight Bearing Restrictions: No      Mobility Bed Mobility Overal bed mobility: Modified Independent                  Transfers Overall transfer level: Modified independent Equipment used: None             General transfer comment: pt preferred at least unilateral UE support    Balance Overall balance assessment: Modified Independent Sitting-balance support: Feet supported Sitting balance-Leahy Scale: Normal Sitting balance - Comments: able  to sit on BSC, perform pericare with supervision     Standing balance-Leahy Scale: Good Standing balance comment: Reports 3 falls in previous 12 months                           ADL either performed or assessed with clinical judgement   ADL Overall ADL's : Modified independent                                       General ADL Comments: Mod I (increased time) for dressing, toileting, grooming     Vision Patient Visual Report: No change from baseline       Perception     Praxis      Pertinent Vitals/Pain Pain Assessment: No/denies pain Pain Score: 4  Pain Location: headache Pain Descriptors / Indicators: Aching Pain Intervention(s): Monitored during session     Hand Dominance Right   Extremity/Trunk Assessment Upper Extremity Assessment Upper Extremity Assessment: RUE deficits/detail;LUE deficits/detail RUE Deficits / Details: grossly 4/5 RUE Sensation: WNL RUE Coordination: WNL LUE Deficits / Details: grossly 3+/5 LUE Coordination: WNL   Lower Extremity Assessment Lower Extremity Assessment: RLE deficits/detail;LLE deficits/detail RLE Deficits / Details: 4/5 grossly RLE Coordination: WNL LLE Deficits / Details: 3+/5 grossly LLE Coordination: WNL   Cervical / Trunk Assessment Cervical / Trunk Assessment: Normal   Communication Communication  Communication: No difficulties   Cognition Arousal/Alertness: Awake/alert Behavior During Therapy: WFL for tasks assessed/performed Overall Cognitive Status: Within Functional Limits for tasks assessed                                     General Comments       Exercises Other Exercises Other Exercises: Extended time spent on standard commode due to pt having a BM. able to demonstrate good balance, perform pericare with supervision/modI. washed hands at sink as well Other Exercises: bed mobility, toileting, grooming, dressing, standing/sitting balance tolerance, DC planning    Shoulder Instructions      Home Living Family/patient expects to be discharged to:: Private residence Living Arrangements: Alone Available Help at Discharge: Family;Available PRN/intermittently Type of Home: House Home Access: Stairs to enter Entrance Stairs-Number of Steps: 1 + 2 + 1 additional step Entrance Stairs-Rails: Can reach both;Right;Left Home Layout: One level     Bathroom Shower/Tub: Teacher, early years/pre: Standard     Home Equipment: Cane - single point;Grab bars - toilet;Grab bars - tub/shower   Additional Comments: pt reports 3 falls in previous 12 months      Prior Functioning/Environment Level of Independence: Independent        Comments: pt drives, shops for groceries, does volunteer work each day        OT Problem List: Decreased strength;Decreased activity tolerance;Impaired balance (sitting and/or standing)      OT Treatment/Interventions:      OT Goals(Current goals can be found in the care plan section) Acute Rehab OT Goals Patient Stated Goal: to go home OT Goal Formulation: With patient Time For Goal Achievement: 05/23/21 Potential to Achieve Goals: Good  OT Frequency:     Barriers to D/C:            Co-evaluation              AM-PAC OT "6 Clicks" Daily Activity     Outcome Measure Help from another person eating meals?: None Help from another person taking care of personal grooming?: None Help from another person toileting, which includes using toliet, bedpan, or urinal?: A Little Help from another person bathing (including washing, rinsing, drying)?: None Help from another person to put on and taking off regular upper body clothing?: None Help from another person to put on and taking off regular lower body clothing?: A Little 6 Click Score: 22   End of Session    Activity Tolerance: Patient tolerated treatment well Patient left: in bed;with family/visitor present;with call bell/phone within reach;with bed  alarm set  OT Visit Diagnosis: Unsteadiness on feet (R26.81);Muscle weakness (generalized) (M62.81);History of falling (Z91.81)                Time: 5625-6389 OT Time Calculation (min): 20 min Charges:  OT General Charges $OT Visit: 1 Visit OT Evaluation $OT Eval Low Complexity: 1 Low OT Treatments $Self Care/Home Management : 8-22 mins Josiah Lobo, PhD, MS, OTR/L 05/09/21, 1:29 PM

## 2021-05-10 LAB — GLUCOSE, CAPILLARY
Glucose-Capillary: 258 mg/dL — ABNORMAL HIGH (ref 70–99)
Glucose-Capillary: 244 mg/dL — ABNORMAL HIGH (ref 70–99)
Glucose-Capillary: 108 mg/dL — ABNORMAL HIGH (ref 70–99)
Glucose-Capillary: 208 mg/dL — ABNORMAL HIGH (ref 70–99)

## 2021-05-10 LAB — HEMOGLOBIN A1C
Hgb A1c MFr Bld: 7.1 % — ABNORMAL HIGH (ref 4.8–5.6)
Mean Plasma Glucose: 157 mg/dL

## 2021-05-10 MED ORDER — CLOPIDOGREL BISULFATE 75 MG PO TABS
225.0000 mg | ORAL_TABLET | Freq: Once | ORAL | Status: AC
  Administered 2021-05-10: 225 mg via ORAL
  Filled 2021-05-10: qty 3

## 2021-05-10 MED ORDER — ATORVASTATIN CALCIUM 20 MG PO TABS: 80.0000 mg | ORAL_TABLET | Freq: Every day | ORAL | Status: DC

## 2021-05-10 MED ORDER — LOSARTAN POTASSIUM 25 MG PO TABS: 25.0000 mg | ORAL_TABLET | Freq: Every day | ORAL | Status: DC

## 2021-05-10 MED ORDER — LABETALOL HCL 5 MG/ML IV SOLN
20.0000 mg | Freq: Once | INTRAVENOUS | Status: AC
  Administered 2021-05-10: 20 mg via INTRAVENOUS

## 2021-05-10 MED ORDER — LABETALOL HCL 5 MG/ML IV SOLN
INTRAVENOUS | Status: AC
  Filled 2021-05-10: qty 4

## 2021-05-10 MED ORDER — LEVOTHYROXINE SODIUM 125 MCG PO TABS: 125.0000 ug | ORAL_TABLET | Freq: Every day | ORAL | Status: DC

## 2021-05-10 MED ORDER — CLOPIDOGREL BISULFATE 75 MG PO TABS: 75.0000 mg | ORAL_TABLET | Freq: Every day | ORAL | Status: DC

## 2021-05-10 MED ORDER — LABETALOL HCL 5 MG/ML IV SOLN
5.0000 mg | Freq: Once | INTRAVENOUS | Status: AC
  Administered 2021-05-10: 01:00:00 5 mg via INTRAVENOUS
  Filled 2021-05-10: qty 4

## 2021-05-10 NOTE — Plan of Care (Signed)
[facility] called and sd pt had bed on [address].  Was able to get documentation ready.  Tried to call pt's daughters and only able to leave a msg for Maribel.  Called report to [place], Therapist, sports.   Pt's BP had been stable, but was elevated right at time of transfer.  Rec'd order for 20 mg labetalol and it was administered.  [facility] transported her out.

## 2021-05-10 NOTE — Progress Notes (Signed)
Neurology Progress Note  Patient ID: Madison Davidson is a 70 y.o. with PMHx of hypertension, hyperlipidemia, type 2 diabetes, thyroid disease, valvular heart disease ("minimal aortic insufficiency and trace tricuspid regurgitation" per cardiology notes), obesity (BMI 32), depression. 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   Major interval events:  - BP labile overnight as noted below  Subjective: -Reports that when she stood up and ambulated to the bathroom she had numbness of her left ear which has since resolved -Otherwise denies any new focal neurological symptoms  Exam: Current vital signs: BP (!) 138/43 (BP Location: Left Arm)   Pulse 79   Temp 97.8 F (36.6 C) (Oral)   Resp 18   Ht 5\' 2"  (1.575 m)   Wt 79.6 kg   SpO2 98%   BMI 32.10 kg/m  Vital signs in last 24 hours: Temp:  [97.4 F (36.3 C)-98 F (36.7 C)] 97.8 F (36.6 C) (06/15 0849) Pulse Rate:  [65-79] 79 (06/15 0849) Resp:  [16-20] 18 (06/15 0849) BP: (138-210)/(43-78) 138/43 (06/15 0849) SpO2:  [97 %-100 %] 98 % (06/15 0849)  Gen: In bed, comfortable  Resp: non-labored breathing, no grossly audible wheezing Cardiac: Perfusing extremities well  Abd: soft, nt  Neuro: Mental Status: Patient is awake, alert, oriented to person, place, month, year, and situation. Patient is able to give a clear and coherent history. No signs of aphasia or neglect, improved slurring of her speech today Cranial Nerves: II: Visual Fields are full.  III,IV, VI: EOMI without ptosis or diploplia. V: Facial sensation symmetric throughout today, improved from yesterday VII: Facial movement is symmetric. VIII: hearing is intact to voice X: Uvula elevates symmetrically XI: Shoulder shrug is symmetric XII: tongue is midline without atrophy or fasciculations. Motor: Tone is normal. Bulk is normal. 5/5 strength was present in all four extremities  other than left hip flexion 4/5. Subtle pronator drift noted in the LUE today, not present yesterday Sensory: Sensation is symmetric to light touch in the arms and legs. Deep Tendon Reflexes: 3+ and symmetric in the biceps and patellae. Plantars checked 6/14: Toes are downgoing on the right and upgoing on the left Cerebellar: FNF and HKS are intact bilaterally, slight physiological tremor today in bilateral UE    Pertinent Data:   Basic Metabolic Panel: Recent Labs  Lab 05/08/21 1258 05/09/21 1306  NA 137 135  K 4.0 4.0  CL 104 103  CO2 22 25  GLUCOSE 232* 262*  BUN 23 18  CREATININE 1.07* 0.75  CALCIUM 9.2 8.9  MG  --  2.0  PHOS  --  3.0    CBC: Recent Labs  Lab 05/08/21 1258 05/09/21 1306  WBC 9.0 8.2  HGB 11.7* 12.2  HCT 37.0 37.6  MCV 79.1* 78.8*  PLT 309 320    Coagulation Studies: No results for input(s): LABPROT, INR in the last 72 hours.    Lab Results  Component Value Date   CHOL 198 05/09/2021   HDL 35 (L) 05/09/2021   LDLCALC 119 (H) 05/09/2021   TRIG 220 (H) 05/09/2021   CHOLHDL 5.7 05/09/2021   Lab Results  Component Value Date   HGBA1C 7.1 (H) 05/09/2021     MRI brain personally reviewed with diffusion restriction in the right corona radiata, as well as more laterally in the right frontal cortex concerning for subacute infarction, somewhat embolic in appearance CTA personally reviewed with a severe right M1 origin and bifurcation stenosis,  likely the culprit lesion, as well as other scattered intracranial atherosclerosis (moderate left M2, irregular left vertebral artery, irregular left P2 PCA) Echocardiogram completed  1. Left ventricular ejection fraction, by estimation, is 60 to 65%. The  left ventricle has normal function. The left ventricle has no regional  wall motion abnormalities. Left ventricular diastolic parameters are  indeterminate.   2. Right ventricular systolic function is normal. The right ventricular  size is normal.    Carotid ultrasound: IMPRESSION: Color duplex indicates moderate heterogeneous plaque with no hemodynamically significant stenosis by duplex criteria in the extracranial cerebrovascular circulation. Left vertebral artery waveform not identified.    Impression: 70 year old woman with severe intracranial atherosclerosis secondary to multiple vascular risk factors as detailed above.  Given symptoms with ambulation, her cerebral perfusion is pressure dependent.  Therefore discussed the case with Dr. Karenann Cai who agrees patient is a candidate for intracranial stenting of the right MCA origin and bifurcation.  Recommendations:  # Symptomatic R MCA stenosis  -Transfer to [facility] for urgent but not emergent intervention, stroke team to follow in consultation -Per Dr. Karenann Cai, repeat Plavix 300 mg today, then continue 75 mg daily -Continue aspirin 81 mg daily -Continue atorvastatin 80 mg, again performed a diet and exercise counseling with patient, may need PSK 9 inhibitor on an outpatient basis if her LDL remains above goal -Given body habitus, stroke and somnolence consider outpatient sleep study -Allow permissive hypertension up to [place] MD-PhD [facility] [phone]   Greater than 35 minutes were spent in direct care of this patient today

## 2021-05-10 NOTE — Discharge Summary (Addendum)
Physician Discharge Summary  Madison Davidson DXA:[id number] DOB: 1951-03-03 DOA: 05/08/2021  [facility] [place] date: 05/08/2021 Discharge date: 05/10/2021  Admitted From: home Discharge disposition: to [facility] for neuro IR intervention   Recommendations for Outpatient Follow-Up:   intracranial stenting of the right MCA origin and bifurcation: Tx to [facility] for IR eval: Dr. Karenann Cai agreed intervention is appropriate per Dr. Curly Shores Will need neuro IR consult when arrives at [facility]   Discharge Diagnosis:   Principal Problem:   Slurred speech Active Problems:   Benign essential hypertension   Hyperlipidemia, mixed   DM II (diabetes mellitus, type II), controlled ([place])   AKI (acute kidney injury) ([place])   Anemia   CVA (cerebral vascular accident) [facility])    Discharge Condition: Improved.  Diet recommendation: Low sodium, heart healthy.  Carbohydrate-modified.  Wound care: None.  Code status: Full.   History of Present Illness:   Madison Davidson is a 70 y.o. female seen in ed with complaints of slurred speech.  Patient states that her daughter called EMS to bring her to the hospital because her speech was not normal.  This per report happened yesterday and is currently resolved.  Patient states that it all kind of started when she started feeling tired a few weeks ago.  About 3 weeks ago patient states that she fell while trying to feed her cats and did not lose consciousness, her knees gave out and she fell into what sounds like a litter box then she cleaned herself off got up and was feeling fine so she did not seek any medical attention.  But she has been feeling more weak and tired and not having any energy.  Patient states that she lives alone, and otherwise is able to manage herself and bathe herself do her ADLs and finances.  There is a question of history of stroke patient is unclear about it she says that she was told to take an  aspirin which she only took for few days.  Patient does not note any allergies to any medications.  Patient denies any bleeding or chest pains or palpitations or vision or gait issues.     Hospital Course by Problem:  70 year old female with history of NIDDM-2, CAD, CVA and HTN brought to ED by EMS with slurred speech that has started 2 days prior to arrival.  Reportedly, had a fall 3 weeks prior, and has been week.  She is on aspirin but takes sporadically.   In ED, hypotensive with systolic in 209O.  Glucose elevated but not in DKA or HHS.  CT showed ill-defined hypodensity in right frontal white matter and chronic left basal ganglia infarct.  MRI brain showed acute to early subacute right frontal lobe infarct, chronic left BG and left occipital infarcts.  Patient was admitted for further CVA evaluation.   Medical Consultants:   Symptomatic R MCA stenosis -Transfer to [facility] for urgent but not emergent intervention, stroke team to follow in consultation -Per Dr. Karenann Cai, repeat Plavix 300 mg today, then continue 75 mg daily -Continue aspirin 81 mg daily -Continue atorvastatin 80 mg, again performed a diet and exercise counseling with patient, may need PSK 9 inhibitor on an outpatient basis if her LDL remains above goal -Given body habitus, stroke and somnolence consider outpatient sleep study -Allow permissive hypertension up to 220/120     HTN --Allow permissive hypertension up to 220/120 -ARB was to be started outpatient  per cards note   NIDDM-2 with hyperglycemia and hyperlipidemia:  -hold PO meds -SSI -novology with meals    History of CAD: No anginal symptoms. -Aspirin and statin as above    Mood disorder: Stable. -Continue home medications-citalopram and nortriptyline  ? Memory issues -would recommend formal eval by neuropsych as an outpateint  Discharge Exam:   Vitals:   05/10/21 0458 05/10/21 0849  BP: (!) 186/78 (!) 138/43  Pulse: 68 79  Resp: 16 18   Temp: (!) 97.4 F (36.3 C) 97.8 F (36.6 C)  SpO2: 100% 98%   Vitals:   05/10/21 0244 05/10/21 0247 05/10/21 0458 05/10/21 0849  BP: (!) 183/70 (!) 176/77 (!) 186/78 (!) 138/43  Pulse: 67 67 68 79  Resp:   16 18  Temp:   (!) 97.4 F (36.3 C) 97.8 F (36.6 C)  TempSrc:   Oral Oral  SpO2:   100% 98%  Weight:      Height:        General exam: Appears calm and comfortable.    The results of significant diagnostics from this hospitalization (including imaging, microbiology, ancillary and laboratory) are listed below for reference.     Procedures and Diagnostic Studies:   CT ANGIO HEAD W OR WO CONTRAST  Result Date: 05/09/2021 CLINICAL DATA:  Acute stroke on MRI brain EXAM: CT ANGIOGRAPHY HEAD TECHNIQUE: Multidetector CT imaging of the head was performed using the standard protocol during bolus administration of intravenous contrast. Multiplanar CT image reconstructions and MIPs were obtained to evaluate the vascular anatomy. CONTRAST:  30mL OMNIPAQUE IOHEXOL 350 MG/ML SOLN COMPARISON:  CT 05/08/2021, correlation made with MRI brain 05/08/2021 FINDINGS: CT HEAD Brain: Evolving recent infarction is again identified within the right corona radiata. Small area of right frontal cortical involvement on the MRI is not visible by CT. There is no acute intracranial hemorrhage. No significant mass effect. Chronic infarct of the left basal ganglia and adjacent white matter with ex vacuo dilatation of the adjacent lateral ventricle. No hydrocephalus. No extra-axial collection. Vascular: There is atherosclerotic calcification at the skull base. Skull: Calvarium is unremarkable. Sinuses/Orbits: No acute finding. Other: None. CTA HEAD Anterior circulation: Intracranial internal carotid arteries are patent with calcified plaque but no significant stenosis. Anterior cerebral arteries are patent. There is eccentric plaque at the origin of the right MCA with marked stenosis. High-grade stenosis is present at  the right MCA bifurcation. Left MCA is patent. There is moderate stenosis of an anterior division M2 branch. Posterior circulation: Dominant intracranial right vertebral artery is patent. Proximal intracranial left vertebral artery is irregular with diminished enhancement. There is improved opacification more distally beginning at the PICA origin, which may reflect retrograde flow. Basilar artery is patent. Major cerebellar artery origins are patent. A left posterior communicating artery is present. There is irregularity of the left P2 PCA with moderate to marked stenosis. Venous sinuses: As permitted by contrast timing, patent. Review of the MIP images confirms the above findings. IMPRESSION: Evolving recent infarction of the right corona radiata. No acute intracranial hemorrhage. 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 Moderate left M2 MCA branch stenosis. Diffusely irregular left vertebral artery with diminished flow. There is improvement beginning at the PICA origin likely from retrograde flow. Irregular left P2 PCA with moderate to marked stenosis. Electronically Signed   By: Macy Mis M.D.   On: 05/09/2021 14:35   US Carotid Bilateral (at [facility] and [facility] only)  Result Date: 05/09/2021 CLINICAL DATA:  70 year old female  with a history of CVA EXAM: BILATERAL CAROTID DUPLEX ULTRASOUND TECHNIQUE: Pearline Cables scale imaging, color Doppler and duplex ultrasound were performed of bilateral carotid and vertebral arteries in the neck. COMPARISON:  None. FINDINGS: Criteria: Quantification of carotid stenosis is based on velocity parameters that correlate the residual internal carotid diameter with NASCET-based stenosis levels, using the diameter of the distal internal carotid lumen as the denominator for stenosis measurement. The following velocity measurements were obtained: RIGHT ICA:  Systolic 481 cm/sec, Diastolic 19 cm/sec CCA:  856 cm/sec SYSTOLIC ICA/CCA RATIO:  1.5 ECA:  128 cm/sec LEFT ICA:   Systolic 95 cm/sec, Diastolic 11 cm/sec CCA:  84 cm/sec SYSTOLIC ICA/CCA RATIO:  1.1 ECA:  157 cm/sec Right Brachial SBP: Not acquired Left Brachial SBP: Not acquired RIGHT CAROTID ARTERY: No significant calcified disease of the right common carotid artery. Intermediate waveform maintained. Heterogeneous plaque without significant calcifications at the right carotid bifurcation. Low resistance waveform of the right ICA. No significant tortuosity. RIGHT VERTEBRAL ARTERY: Antegrade flow with low resistance waveform. LEFT CAROTID ARTERY: No significant calcified disease of the left common carotid artery. Intermediate waveform maintained. Heterogeneous plaque at the left carotid bifurcation without significant calcifications. Low resistance waveform of the left ICA. LEFT VERTEBRAL ARTERY: Left vertebral artery not identified. IMPRESSION: Color duplex indicates moderate heterogeneous plaque with no hemodynamically significant stenosis by duplex criteria in the extracranial cerebrovascular circulation. Left vertebral artery waveform not identified. Signed, Dulcy Fanny. Dellia Nims, RPVI [facility] Electronically Signed   By: Corrie Mckusick D.O.   On: 05/09/2021 08:19   ECHOCARDIOGRAM COMPLETE  Result Date: 05/09/2021    ECHOCARDIOGRAM REPORT   Patient Name:   Madison Davidson Mannor Date of Exam: 05/09/2021 Medical Rec #:  [id number]        Height:       62.0 in Accession #:    [id number]       Weight:       175.5 lb Date of Birth:  11-05-1951       BSA:          1.808 m Patient Age:    36 years         BP:           179/73 mmHg Patient Gender: F                HR:           73 bpm. Exam Location:  [facility] Procedure: 2D Echo, Cardiac Doppler, Color Doppler and Saline Contrast Bubble            Study Indications:     Stroke I63.9  History:         Patient has no prior history of Echocardiogram examinations.                  Stroke; Risk Factors:Diabetes.  Sonographer:      Sherrie Sport RDCS (AE) Referring Phys:  [place] Diagnosing Phys: Ida Rogue MD IMPRESSIONS  1. Left ventricular ejection fraction, by estimation, is 60 to 65%. The left ventricle has normal function. The left ventricle has no regional wall motion abnormalities. Left ventricular diastolic parameters are indeterminate.  2. Right ventricular systolic function is normal. The right ventricular size is normal. FINDINGS  Left Ventricle: Left ventricular ejection fraction, by estimation, is 60 to 65%. The left ventricle has normal function. The left ventricle has no regional wall motion abnormalities. The left ventricular internal cavity size was normal  in size. There is  no left ventricular hypertrophy. Left ventricular diastolic parameters are indeterminate. Right Ventricle: The right ventricular size is normal. No increase in right ventricular wall thickness. Right ventricular systolic function is normal. Left Atrium: Left atrial size was normal in size. Right Atrium: Right atrial size was normal in size. Pericardium: There is no evidence of pericardial effusion. Mitral Valve: The mitral valve was not well visualized. No evidence of mitral valve regurgitation. No evidence of mitral valve stenosis. Tricuspid Valve: The tricuspid valve is not well visualized. Tricuspid valve regurgitation is not demonstrated. No evidence of tricuspid stenosis. Aortic Valve: The aortic valve was not well visualized. Aortic valve regurgitation is not visualized. No aortic stenosis is present. Pulmonic Valve: The pulmonic valve was not well visualized. Pulmonic valve regurgitation is not visualized. No evidence of pulmonic stenosis. Aorta: The aortic root is normal in size and structure. Venous: The inferior vena cava is normal in size with greater than 50% respiratory variability, suggesting right atrial pressure of 3 mmHg. IAS/Shunts: No atrial level shunt detected by color flow Doppler. Agitated saline contrast was given  intravenously to evaluate for intracardiac shunting.  LEFT VENTRICLE PLAX 2D LVIDd:         3.70 cm LVIDs:         2.30 cm LV PW:         1.20 cm LV IVS:        1.30 cm LVOT diam:     2.10 cm LVOT Area:     3.46 cm  LEFT ATRIUM         Index LA diam:    3.20 cm 1.77 cm/m                        PULMONIC VALVE AORTA                 PV Vmax:        0.99 m/s Ao Root diam: 3.20 cm PV Peak grad:   3.9 mmHg                       RVOT Peak grad: 5 mmHg   SHUNTS Systemic Diam: 2.10 cm Ida Rogue MD Electronically signed by Ida Rogue MD Signature Date/Time: 05/09/2021/5:06:01 PM    Final      Labs:   Basic Metabolic Panel: Recent Labs  Lab 05/08/21 1258 05/09/21 1306  NA 137 135  K 4.0 4.0  CL 104 103  CO2 22 25  GLUCOSE 232* 262*  BUN 23 18  CREATININE 1.07* 0.75  CALCIUM 9.2 8.9  MG  --  2.0  PHOS  --  3.0   GFR Estimated Creatinine Clearance: 63 mL/min (by C-G formula based on SCr of 0.75 mg/dL). Liver Function Tests: Recent Labs  Lab 05/09/21 1306  ALBUMIN 3.6   No results for input(s): LIPASE, AMYLASE in the last 168 hours. No results for input(s): AMMONIA in the last 168 hours. Coagulation profile No results for input(s): INR, PROTIME in the last 168 hours.  CBC: Recent Labs  Lab 05/08/21 1258 05/09/21 1306  WBC 9.0 8.2  HGB 11.7* 12.2  HCT 37.0 37.6  MCV 79.1* 78.8*  PLT 309 320   Cardiac Enzymes: No results for input(s): CKTOTAL, CKMB, CKMBINDEX, TROPONINI in the last 168 hours. BNP: Invalid input(s): POCBNP CBG: Recent Labs  Lab 05/09/21 1107 05/09/21 1614 05/09/21 2000 05/09/21 2214 05/10/21 0851  GLUCAP 283* 170* 261* 102*  258*   D-Dimer No results for input(s): DDIMER in the last 72 hours. Hgb A1c Recent Labs    05/09/21 0519  HGBA1C 7.1*   Lipid Profile Recent Labs    05/09/21 0519  CHOL 198  HDL 35*  LDLCALC 119*  TRIG 220*  CHOLHDL 5.7   Thyroid function studies No results for input(s): TSH, T4TOTAL, T3FREE, THYROIDAB in  the last 72 hours.  Invalid input(s): FREET3 Anemia work up No results for input(s): VITAMINB12, FOLATE, FERRITIN, TIBC, IRON, RETICCTPCT in the last 72 hours. Microbiology Recent Results (from the past 240 hour(s))  Resp Panel by RT-PCR (Flu A&B, Covid) Nasopharyngeal Swab     Status: None   Collection Time: 05/08/21  2:19 PM   Specimen: Nasopharyngeal Swab; Nasopharyngeal(NP) swabs in vial transport medium  Result Value Ref Range Status   SARS Coronavirus 2 by RT PCR NEGATIVE NEGATIVE Final    Comment: (NOTE) SARS-CoV-2 target nucleic acids are NOT DETECTED.  The SARS-CoV-2 RNA is generally detectable in upper respiratory specimens during the acute phase of infection. The lowest concentration of SARS-CoV-2 viral copies this assay can detect is 138 copies/mL. A negative result does not preclude SARS-Cov-2 infection and should not be used as the sole basis for treatment or other patient management decisions. A negative result may occur with  improper specimen collection/handling, submission of specimen other than nasopharyngeal swab, presence of viral mutation(s) within the areas targeted by this assay, and inadequate number of viral copies(<138 copies/mL). A negative result must be combined with clinical observations, patient history, and epidemiological information. The expected result is Negative.  Fact Sheet for Patients:  EntrepreneurPulse.com.au  Fact Sheet for Healthcare Providers:  IncredibleEmployment.be  This test is no t yet approved or cleared by the [place] FDA and  has been authorized for detection and/or diagnosis of SARS-CoV-2 by FDA under an Emergency Use Authorization (EUA). This EUA will remain  in effect (meaning this test can be used) for the duration of the COVID-19 declaration under Section 564(b)(1) of the Act, 21 U.S.C.section 360bbb-3(b)(1), unless the authorization is terminated  or revoked sooner.        Influenza A by PCR NEGATIVE NEGATIVE Final   Influenza B by PCR NEGATIVE NEGATIVE Final    Comment: (NOTE) The Xpert Xpress SARS-CoV-2/FLU/RSV plus assay is intended as an aid in the diagnosis of influenza from Nasopharyngeal swab specimens and should not be used as a sole basis for treatment. Nasal washings and aspirates are unacceptable for Xpert Xpress SARS-CoV-2/FLU/RSV testing.  Fact Sheet for Patients: EntrepreneurPulse.com.au  Fact Sheet for Healthcare Providers: IncredibleEmployment.be  This test is not yet approved or cleared by the [place] FDA and has been authorized for detection and/or diagnosis of SARS-CoV-2 by FDA under an Emergency Use Authorization (EUA). This EUA will remain in effect (meaning this test can be used) for the duration of the COVID-19 declaration under Section 564(b)(1) of the Act, 21 U.S.C. section 360bbb-3(b)(1), unless the authorization is terminated or revoked.  Performed at [facility], [address] [place], [place] [address]      Discharge Instructions:   Discharge Instructions     Diet - low sodium heart healthy   Complete by: As directed    Diet Carb Modified   Complete by: As directed    Increase activity slowly   Complete by: As directed       Allergies as of 05/10/2021   No Known Allergies      Medication List  STOP taking these medications    celecoxib 200 MG capsule Commonly known as: CELEBREX   diltiazem 120 MG 24 hr capsule Commonly known as: DILACOR XR   fexofenadine 180 MG tablet Commonly known as: ALLEGRA   glipiZIDE 10 MG tablet Commonly known as: GLUCOTROL   metFORMIN 500 MG tablet Commonly known as: GLUCOPHAGE   saxagliptin HCl 5 MG Tabs tablet Commonly known as: ONGLYZA       TAKE these medications    aspirin EC 81 MG tablet Take 81 mg by mouth as needed.   atorvastatin 20 MG tablet Commonly known as: LIPITOR Take 4 tablets (80  mg total) by mouth at bedtime. What changed:  medication strength how much to take when to take this additional instructions   citalopram 10 MG tablet Commonly known as: CELEXA Take 10 mg by mouth daily.   clopidogrel 75 MG tablet Commonly known as: PLAVIX Take 1 tablet (75 mg total) by mouth daily. Start taking on: May 11, 2021   levothyroxine 125 MCG tablet Commonly known as: SYNTHROID Take 1 tablet (125 mcg total) by mouth daily at 6 (six) AM. Start taking on: May 11, 2021 What changed:  medication strength how much to take when to take this   nortriptyline 10 MG capsule Commonly known as: PAMELOR Take 10 mg by mouth at bedtime.        Had to [facility] for daughter, tabatha  Time coordinating discharge: 35 min  Signed:  Geradine Girt DO  [facility] 05/10/2021, 12:06 PM

## 2021-05-10 NOTE — Progress Notes (Addendum)
   05/10/21 0046  Assess: MEWS Score  Temp 97.6 F (36.4 C)  BP (!) 210/66  Pulse Rate 74  Resp 18  SpO2 98 %  O2 Device Room Air  Assess: MEWS Score  MEWS Temp 0  MEWS Systolic 2  MEWS Pulse 0  MEWS RR 0  MEWS LOC 0  MEWS Score 2  MEWS Score Color Yellow  Assess: if the MEWS score is Yellow or Red  Were vital signs taken at a resting state? Yes  Focused Assessment No change from prior assessment  Early Detection of Sepsis Score *See Row Information* Low  MEWS guidelines implemented *See Row Information* Yes  Treat  MEWS Interventions Escalated (See documentation below)  Take Vital Signs  Increase Vital Sign Frequency  Yellow: Q 2hr X 2 then Q 4hr X 2, if remains yellow, continue Q 4hrs  Escalate  MEWS: Escalate Yellow: discuss with charge nurse/RN and consider discussing with provider and RRT  Notify: Charge Nurse/RN  Name of Charge Nurse/RN Notified Jackie, RN  Date Charge Nurse/RN Notified 05/10/21  Time Charge Nurse/RN Notified 0050  Notify: Provider  Provider Name/Title Judd Gaudier, MD  Date Provider Notified 05/10/21  Time Provider Notified 0100  Notification Type Page  Notification Reason Other (Comment) (pt didn't respond to PRN med)  Provider response See new orders  Date of Provider Response 05/10/21  Time of Provider Response 0110  Document  Progress note created (see row info) Yes   Pt's BP was elevated and gave 25 mg hydralazine PO for SBP > 200.  It had been 216/81. When we rechecked it had barely come down to 210/66. Texted MD and rec'd order for 5 mg labetolol.  Will continue to monitor. MD goal for SBP is 180-210.

## 2021-05-11 ENCOUNTER — Other Ambulatory Visit: Payer: Self-pay

## 2021-05-11 ENCOUNTER — Encounter (HOSPITAL_COMMUNITY)
Admission: AD | Disposition: A | Discharge status: Rehabilitation Facility | Payer: Self-pay | Source: Other Acute Inpatient Hospital | Attending: Family Medicine

## 2021-05-11 ENCOUNTER — Inpatient Hospital Stay (HOSPITAL_COMMUNITY): Payer: Medicare Other | Admitting: Anesthesiology

## 2021-05-11 ENCOUNTER — Inpatient Hospital Stay (HOSPITAL_COMMUNITY)
Admission: AD | Admit: 2021-05-11 | Discharge: 2021-05-15 | DRG: 027 | Disposition: A | Discharge status: Rehabilitation Facility | Payer: Medicare Other | Source: Other Acute Inpatient Hospital | Attending: Family Medicine | Admitting: Family Medicine

## 2021-05-11 ENCOUNTER — Inpatient Hospital Stay (HOSPITAL_COMMUNITY): Payer: Medicare Other

## 2021-05-11 ENCOUNTER — Other Ambulatory Visit (HOSPITAL_COMMUNITY): Payer: Medicare Other

## 2021-05-11 ENCOUNTER — Encounter (HOSPITAL_COMMUNITY): Payer: Self-pay | Admitting: Internal Medicine

## 2021-05-11 DIAGNOSIS — R471 Dysarthria and anarthria: Secondary | ICD-10-CM | POA: Diagnosis present

## 2021-05-11 DIAGNOSIS — Z8249 Family history of ischemic heart disease and other diseases of the circulatory system: Secondary | ICD-10-CM

## 2021-05-11 DIAGNOSIS — I10 Essential (primary) hypertension: Secondary | ICD-10-CM | POA: Diagnosis present

## 2021-05-11 DIAGNOSIS — Z79899 Other long term (current) drug therapy: Secondary | ICD-10-CM | POA: Diagnosis not present

## 2021-05-11 DIAGNOSIS — I6330 Cerebral infarction due to thrombosis of unspecified cerebral artery: Secondary | ICD-10-CM | POA: Insufficient documentation

## 2021-05-11 DIAGNOSIS — E1159 Type 2 diabetes mellitus with other circulatory complications: Secondary | ICD-10-CM

## 2021-05-11 DIAGNOSIS — I639 Cerebral infarction, unspecified: Secondary | ICD-10-CM | POA: Diagnosis present

## 2021-05-11 DIAGNOSIS — Z79890 Hormone replacement therapy (postmenopausal): Secondary | ICD-10-CM | POA: Diagnosis not present

## 2021-05-11 DIAGNOSIS — E7800 Pure hypercholesterolemia, unspecified: Secondary | ICD-10-CM

## 2021-05-11 DIAGNOSIS — F419 Anxiety disorder, unspecified: Secondary | ICD-10-CM | POA: Diagnosis present

## 2021-05-11 DIAGNOSIS — I2510 Atherosclerotic heart disease of native coronary artery without angina pectoris: Secondary | ICD-10-CM | POA: Diagnosis present

## 2021-05-11 DIAGNOSIS — F32A Depression, unspecified: Secondary | ICD-10-CM | POA: Diagnosis present

## 2021-05-11 DIAGNOSIS — Z7982 Long term (current) use of aspirin: Secondary | ICD-10-CM

## 2021-05-11 DIAGNOSIS — R531 Weakness: Secondary | ICD-10-CM | POA: Diagnosis present

## 2021-05-11 DIAGNOSIS — I69354 Hemiplegia and hemiparesis following cerebral infarction affecting left non-dominant side: Secondary | ICD-10-CM | POA: Diagnosis not present

## 2021-05-11 DIAGNOSIS — R4781 Slurred speech: Secondary | ICD-10-CM | POA: Diagnosis present

## 2021-05-11 DIAGNOSIS — E11649 Type 2 diabetes mellitus with hypoglycemia without coma: Secondary | ICD-10-CM

## 2021-05-11 DIAGNOSIS — I6601 Occlusion and stenosis of right middle cerebral artery: Principal | ICD-10-CM | POA: Diagnosis present

## 2021-05-11 DIAGNOSIS — E669 Obesity, unspecified: Secondary | ICD-10-CM | POA: Diagnosis present

## 2021-05-11 DIAGNOSIS — E782 Mixed hyperlipidemia: Secondary | ICD-10-CM | POA: Diagnosis present

## 2021-05-11 DIAGNOSIS — I672 Cerebral atherosclerosis: Secondary | ICD-10-CM | POA: Diagnosis present

## 2021-05-11 DIAGNOSIS — R200 Anesthesia of skin: Secondary | ICD-10-CM | POA: Diagnosis present

## 2021-05-11 DIAGNOSIS — I63311 Cerebral infarction due to thrombosis of right middle cerebral artery: Secondary | ICD-10-CM | POA: Diagnosis present

## 2021-05-11 DIAGNOSIS — E039 Hypothyroidism, unspecified: Secondary | ICD-10-CM | POA: Diagnosis present

## 2021-05-11 DIAGNOSIS — I6622 Occlusion and stenosis of left posterior cerebral artery: Secondary | ICD-10-CM | POA: Diagnosis present

## 2021-05-11 DIAGNOSIS — Z6832 Body mass index (BMI) 32.0-32.9, adult: Secondary | ICD-10-CM

## 2021-05-11 DIAGNOSIS — I63411 Cerebral infarction due to embolism of right middle cerebral artery: Secondary | ICD-10-CM | POA: Diagnosis not present

## 2021-05-11 DIAGNOSIS — E1165 Type 2 diabetes mellitus with hyperglycemia: Secondary | ICD-10-CM | POA: Diagnosis present

## 2021-05-11 DIAGNOSIS — I771 Stricture of artery: Secondary | ICD-10-CM | POA: Diagnosis not present

## 2021-05-11 DIAGNOSIS — R4189 Other symptoms and signs involving cognitive functions and awareness: Secondary | ICD-10-CM | POA: Diagnosis not present

## 2021-05-11 DIAGNOSIS — Z7902 Long term (current) use of antithrombotics/antiplatelets: Secondary | ICD-10-CM | POA: Diagnosis not present

## 2021-05-11 DIAGNOSIS — Z8673 Personal history of transient ischemic attack (TIA), and cerebral infarction without residual deficits: Secondary | ICD-10-CM

## 2021-05-11 HISTORY — PX: IR ANGIOGRAM EXTREMITY LEFT: IMG651

## 2021-05-11 HISTORY — PX: RADIOLOGY WITH ANESTHESIA: SHX6223

## 2021-05-11 HISTORY — PX: IR CT HEAD LTD: IMG2386

## 2021-05-11 HISTORY — PX: IR ANGIO INTRA EXTRACRAN SEL INTERNAL CAROTID BILAT MOD SED: IMG5363

## 2021-05-11 HISTORY — PX: IR INTRA CRAN STENT: IMG2345

## 2021-05-11 HISTORY — PX: IR ANGIO VERTEBRAL SEL VERTEBRAL UNI R MOD SED: IMG5368

## 2021-05-11 HISTORY — PX: IR US GUIDE VASC ACCESS RIGHT: IMG2390

## 2021-05-11 LAB — BASIC METABOLIC PANEL
Anion gap: 11 (ref 5–15)
BUN: 14 mg/dL (ref 8–23)
CO2: 23 mmol/L (ref 22–32)
Calcium: 9 mg/dL (ref 8.9–10.3)
Chloride: 103 mmol/L (ref 98–111)
Creatinine, Ser: 0.82 mg/dL (ref 0.44–1.00)
GFR, Estimated: >60 mL/min (ref >60)
Glucose, Bld: 157 mg/dL — ABNORMAL HIGH (ref 70–99)
Potassium: 3.9 mmol/L (ref 3.5–5.1)
Sodium: 137 mmol/L (ref 135–145)

## 2021-05-11 LAB — GLUCOSE, CAPILLARY
Glucose-Capillary: 158 mg/dL — ABNORMAL HIGH (ref 70–99)
Glucose-Capillary: 131 mg/dL — ABNORMAL HIGH (ref 70–99)
Glucose-Capillary: 137 mg/dL — ABNORMAL HIGH (ref 70–99)
Glucose-Capillary: 121 mg/dL — ABNORMAL HIGH (ref 70–99)
Glucose-Capillary: 164 mg/dL — ABNORMAL HIGH (ref 70–99)
Glucose-Capillary: 152 mg/dL — ABNORMAL HIGH (ref 70–99)

## 2021-05-11 LAB — POCT ACTIVATED CLOTTING TIME
Activated Clotting Time: 237 seconds
Activated Clotting Time: 92 seconds
Activated Clotting Time: 225 seconds
Activated Clotting Time: 237 seconds
Activated Clotting Time: 242 seconds

## 2021-05-11 LAB — CBC
HCT: 38.3 % (ref 36.0–46.0)
Hemoglobin: 12 g/dL (ref 12.0–15.0)
MCH: 25.2 pg — ABNORMAL LOW (ref 26.0–34.0)
MCHC: 31.3 g/dL (ref 30.0–36.0)
MCV: 80.3 fL (ref 80.0–100.0)
Platelets: 340 10*3/uL (ref 150–400)
RBC: 4.77 MIL/uL (ref 3.87–5.11)
RDW: 13.8 % (ref 11.5–15.5)
WBC: 9.4 10*3/uL (ref 4.0–10.5)
nRBC: 0 % (ref 0.0–0.2)

## 2021-05-11 LAB — RAPID URINE DRUG SCREEN, HOSP PERFORMED
Amphetamines: NOT DETECTED
Barbiturates: NOT DETECTED
Benzodiazepines: NOT DETECTED
Cocaine: NOT DETECTED
Opiates: NOT DETECTED
Tetrahydrocannabinol: NOT DETECTED

## 2021-05-11 LAB — PHOSPHORUS: Phosphorus: 4.3 mg/dL (ref 2.5–4.6)

## 2021-05-11 LAB — MAGNESIUM: Magnesium: 2.2 mg/dL (ref 1.7–2.4)

## 2021-05-11 IMAGING — US IR INTRACRANIAL STENT (INCL PTA)
1 series · 1 of 1 positions shown · non-contrast
Comparison: CT/CT angiogram of the head [DATE]

INDICATION: RABADI is a 69 y.o. female with a past medical history
significant for anemia, DM, HLD, CAD, HTN and prior CVA who
presented to [REDACTED] via EMS on [DATE] with complaints of weakness and
slurred speech the day prior which had since resolved, per her
daughter she had not been taking her medications as prescribed. She
was noted to be hypertensive at 171/60 mmHg. Her neurologic exam was
benign while in bed, however, patient presented left-sided
paresthesia with ambulation. MRI of the brain showed acute to early
subacute right frontal lobe infarcts as well as chronic left basal
ganglia and left occipital infarcts. CTA head showed evolving recent
infarction of the right corona radiata without acute intracranial
hemorrhage as well as marked stenoses of the right MCA origin and
bifurcation, moderate left M2 MCA branch stenosis, diffusely
irregular left vertebral artery with diminished flow and irregular
left P2 PCA with moderate to marked stenosis. She has been
transferred to [REDACTED] for planned right MCA angioplasty/stent
placement.

EXAM:
FLUOROSCOPY GUIDED VASCULAR [REDACTED] CEREBRAL ANGIOGRAM
INTRACRANIAL ANGIOPLASTY AND STENTING, RIGHT MCA
FLAT PANEL HEAD CT
TECHNIQUE: Informed written consent was obtained from the patient after a
thorough discussion of the procedural risks, benefits and
alternatives. All questions were addressed. Maximal Sterile Barrier
Technique was utilized including caps, mask, sterile gowns, sterile
gloves, sterile drape, hand hygiene and skin antiseptic. A timeout
was performed prior to the initiation of the procedure.

[Series 1: ir rad eval and mgt. · 1 of 1 slices shown]
[im 1/1]
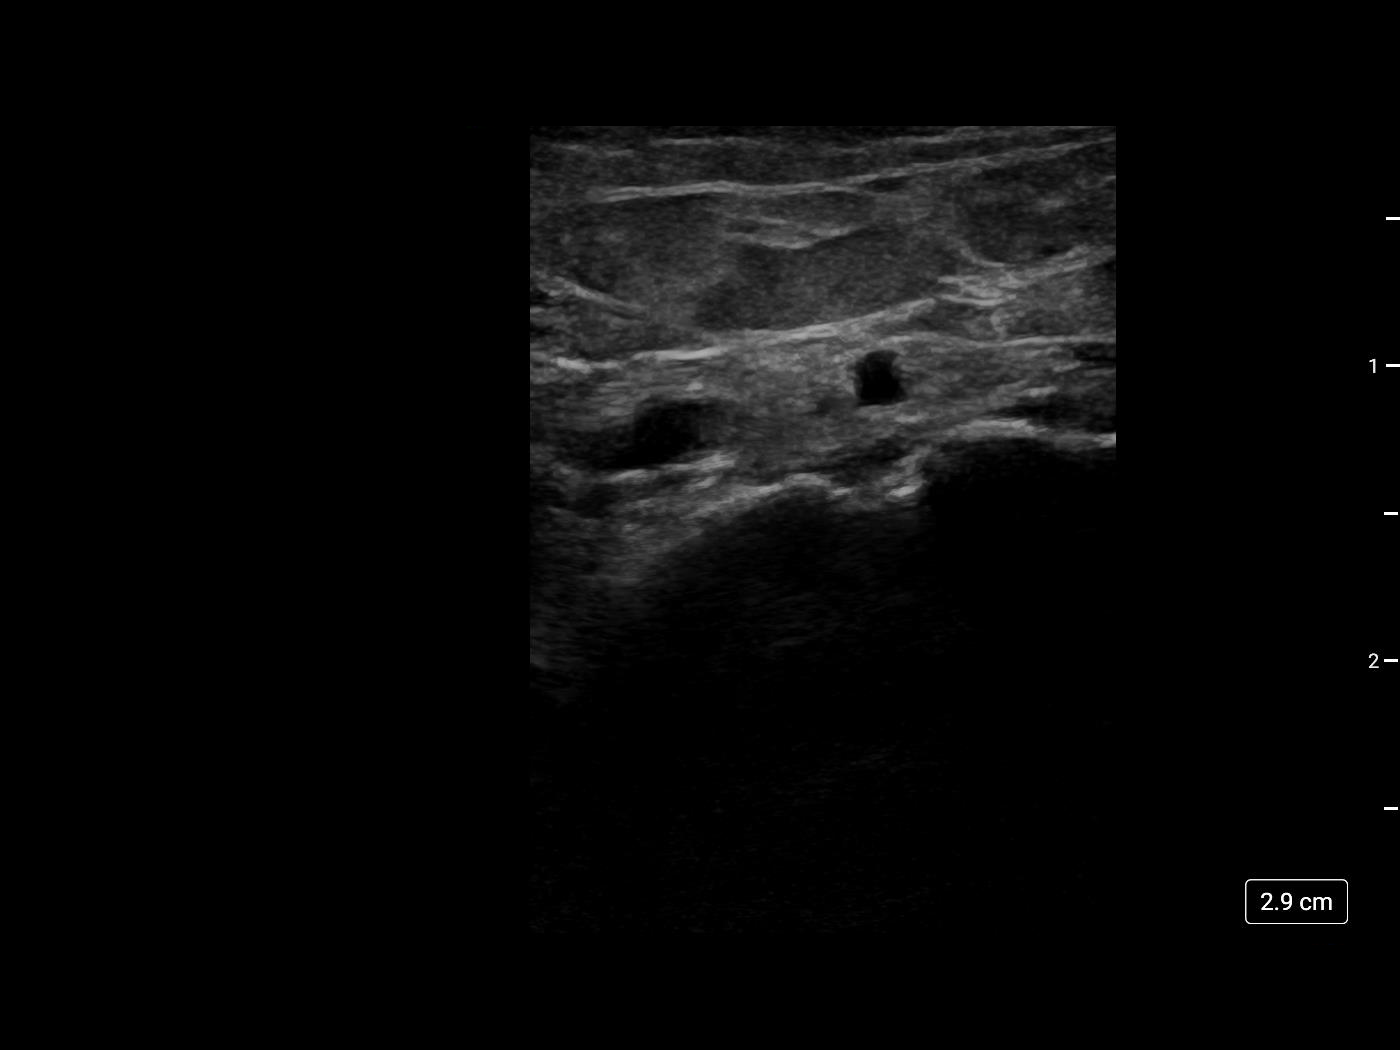

[1 of 1 positions shown; findings below may reference images not displayed]

MEDICATIONS:
Refer to anesthesia documentation.

ANESTHESIA/SEDATION:
The procedure was performed under general anesthesia

CONTRAST:  120 mL of Omnipaque 240 milligram/mL

FLUOROSCOPY TIME:  Fluoroscopy Time: 75 minutes 6 seconds (1,935
mGy).

COMPLICATIONS:
None immediate.
Using the modified Seldinger technique and a micropuncture kit,
access was gained to the distal right radial artery at the
anatomical snuffbox and a 6 French sheath was placed. Real-time
ultrasound guidance was utilized for vascular access including the
acquisition of a permanent ultrasound image documenting patency of
the accessed vessel. Slow intra arterial infusion of 5,000 RABADI
heparin, 5 mg Verapamil and 200 mcg nitroglicerin diluted in
patient's own blood was performed. No significant fluctuation in
patient's blood pressure seen. Then, a right radial artery roadmap
was obtained via sheath side port. Normal brachial artery branching
pattern seen. No significant anatomical variation. The right radial
artery caliber is adequate for vascular access.

Next, a 5 RABADI 2 glide catheter was navigated over a
0.035" Terumo Glidewire into the right subclavian artery under
fluoroscopic guidance. Frontal angiogram of the neck was obtained.

The catheter was advanced into the aortic arch. The catheter tip was
reformed. The catheter was then advanced into the left subclavian
artery. Frontal angiogram of the neck was obtained.

The catheter was subsequently placed into the left common carotid
artery. Frontal and lateral angiograms of the neck were obtained.
Under biplane roadmap, the catheter was then advanced into the left
internal carotid artery. Frontal and lateral angiograms of the head
were obtained.

The catheter was again placed into the right subclavian artery.
Frontal and lateral angiograms of the head were obtained. The the
catheter was then placed clear into the right common carotid artery.
Frontal and lateral angiograms of the neck were obtained. Under
biplane roadmap, the the catheter was then advanced into the right
internal carotid artery. Frontal and lateral angiograms of the head
were obtained.
FINDINGS: 1. Normal course and caliber of the cervical right vertebral artery.
2. Left subclavian artery angiograms showed no evidence of
opacification of the left vertebral artery, likely related to
occlusion.
3. Mild atherosclerotic changes of the left carotid bifurcation
without hemodynamically significant stenosis.
4. Severe stenosis of the P2 segment of the left fetal PCA.
5. Mild stenosis of a left M3/MCA posterior division branch.
6. No significant atherosclerotic changes or stenosis of the right
carotid bifurcation.
7. Atherosclerotic changes of the right carotid siphons with mild
stenosis at the paraclinoid segment.
8. Approximately 50% stenosis at the origin of the right M1/MCA
segment.
9. Approximately 50% stenosis at the origin of a right inferior
temporal branch.
10. Severe, hairline stenosis of the distal right M1/MCA segment,
involving the origin of both anterior and posterior division
branches. There is significant delay in opacification of the
anterior division branches.

PROCEDURE:
This into catheter was exchanged over the wire and under biplane
roadmap for a benchmark guide catheter which was placed in the
distal cervical segment of the right ICA. Magnified frontal and
lateral views of the head were obtained in the working projections.

Then, attempted navigation of a 1.25 x 10 mm RABADI balloon over a
0.014 inch Aristotle microguidewire proved unsuccessful due to
siphon tortuosity. A headway duo microcatheter was navigated over
the Aristotle with a wire into the right M2/MCA posterior division
branch, distal to the stenosis. The wire was exchanged for a zoom 14
exchange micro guidewire. The microcatheter was then exchanged for
the 1.25 x 10 mm RABADI balloon. Angioplasty was performed under
fluoroscopy at the distal right M1/MCA. Right internal carotid
artery angiograms with frontal lateral views showed in proved flow
in the distal right MCA branches.

Attempted navigation of a 2 mm resolute onyx balloon mounted stent
into the right M1/MCA proved unsuccessful due to siphon tortuosity.

Then, a 2 x 8 mm apex balloon was navigated to the level of
stenosis. Additional angioplasty was performed under fluoroscopy.
Right internal carotid angiograms with magnified frontal and lateral
views showed additional improvement of the degree of stenosis.

The headway duo microcatheter was again navigated over the wire into
the right M2/MCA posterior division branch. Subsequently, a 3 x 24
mm neuroform atlas stent was deployed in the distal right M1/MCA
segment extending to the proximal M2 posterior division branch.
Follow-up angiogram showed adequate stent positioning across the
stenosis with significant improvement of the degree of stenosis and
anterograde flow in the posterior division branch. Persistent slow
flow in the anterior division branch noted.

Attempted navigation of the headway duo over the 14 wire through the
stent proved unsuccessful. Attempted navigation of the apex balloon
also proved unsuccessful. The 2 mm apex balloon was then navigated
into the proximal right M1/MCA. Angioplasty was performed under
fluoroscopy. Follow-up angiograms showed mild improvement of the
proximal right M1/MCA stenosis (now approximately 35%). Further
attempts to navigate the balloon for the microcatheter through the
previously deployed stent proved unsuccessful. The catheter was
subsequently withdrawn. Follow-up angiograms with frontal and
lateral views showed stable position and opacification of the stent.
Delayed opacification of the anterior division branches noted,
although improved from baseline.

Flat panel CT of the head was obtained and post processed in a
separate workstation with concurrent attending physician
supervision. Selected images were sent to PACS. No evidence of
hemorrhagic complication.
IMPRESSION: 1. Severe intracranial atherosclerotic disease with severe hairline
stenosis of the distal right M1/MCA segment extending into the
origins of the superior and inferior divisions and severe stenosis
of the left P2/PCA. Mild (50%) stenosis of the proximal right M1/MCA
and at the origin of the anterior temporal branch.
2. Angioplasty and stenting performed for treatment of the area of
severe stenosis at the distal right M1/MCA segment with improvement
of the anterograde flow. Persistent delayed, although improved, flow
to the anterior division branch. No further angioplasty or stenting
performed due to lack of access through the recently deployed stent.
3. Angioplasty performed at the proximal right M1/MCA segment with
approximately 35% residual stenosis.

PLAN:
Continue on dual antiplatelet therapy for 6 month when stent patency
should be evaluated.

## 2021-05-11 IMAGING — XA IR INTRACRANIAL STENT (INCL PTA)
10 of 18 series · 10 of 24 positions shown · IV contrast (IODINE)
Comparison: CT/CT angiogram of the head [DATE]

INDICATION: RABADI is a 69 y.o. female with a past medical history
significant for anemia, DM, HLD, CAD, HTN and prior CVA who
presented to [REDACTED] via EMS on [DATE] with complaints of weakness and
slurred speech the day prior which had since resolved, per her
daughter she had not been taking her medications as prescribed. She
was noted to be hypertensive at 171/60 mmHg. Her neurologic exam was
benign while in bed, however, patient presented left-sided
paresthesia with ambulation. MRI of the brain showed acute to early
subacute right frontal lobe infarcts as well as chronic left basal
ganglia and left occipital infarcts. CTA head showed evolving recent
infarction of the right corona radiata without acute intracranial
hemorrhage as well as marked stenoses of the right MCA origin and
bifurcation, moderate left M2 MCA branch stenosis, diffusely
irregular left vertebral artery with diminished flow and irregular
left P2 PCA with moderate to marked stenosis. She has been
transferred to [REDACTED] for planned right MCA angioplasty/stent
placement.

EXAM:
FLUOROSCOPY GUIDED VASCULAR [REDACTED] CEREBRAL ANGIOGRAM
INTRACRANIAL ANGIOPLASTY AND STENTING, RIGHT MCA
FLAT PANEL HEAD CT
TECHNIQUE: Informed written consent was obtained from the patient after a
thorough discussion of the procedural risks, benefits and
alternatives. All questions were addressed. Maximal Sterile Barrier
Technique was utilized including caps, mask, sterile gowns, sterile
gloves, sterile drape, hand hygiene and skin antiseptic. A timeout
was performed prior to the initiation of the procedure.

[Series 1: n roadmap · 1 of 57 frames shown]
[frame 49/57]
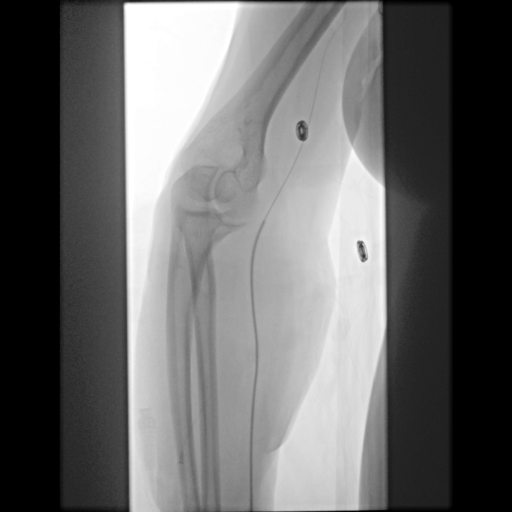

[Series 3: cerebral care 2 · 1 of 14 frames shown (1 of 7)]
[frame 7/14]
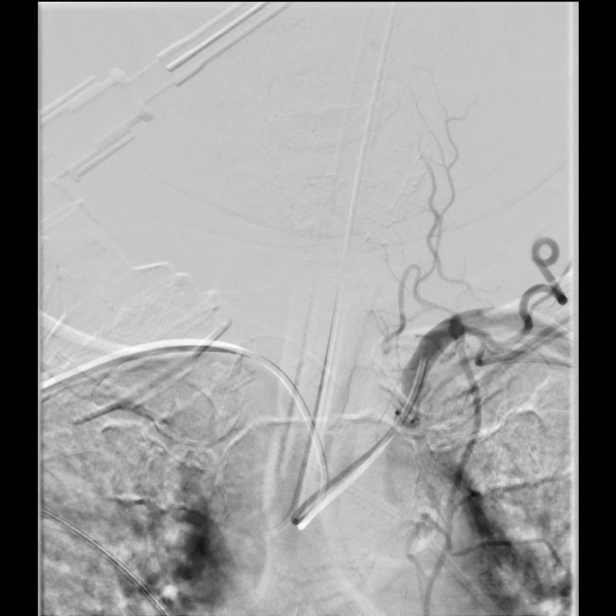

[Series 5: cerebral care 2 · 2 acquisitions, 1 frame shown (2 of 7)]
[im 1/2]
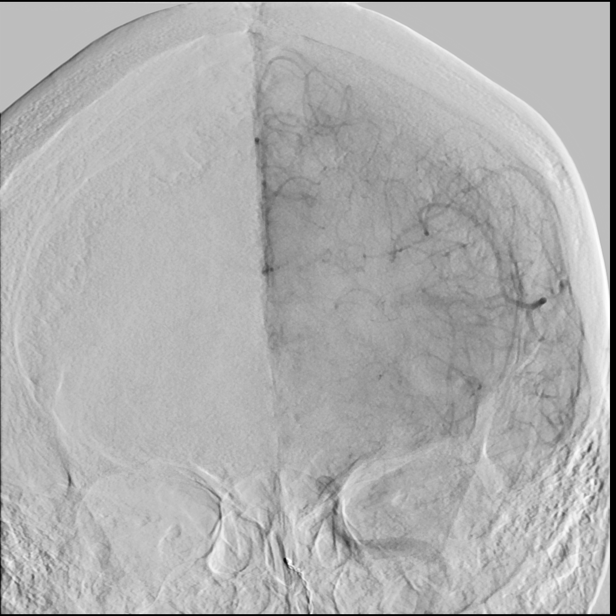

[Series 6: cerebral care 2 · 2 acquisitions, 1 frame shown (3 of 7)]
[im 1/2]
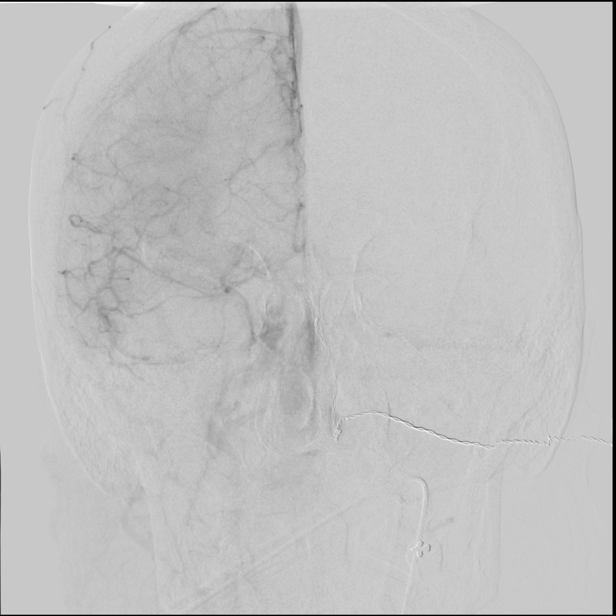

[Series 8: cerebral care 2 · 2 acquisitions, 1 frame shown (4 of 7)]
[im 1/2]
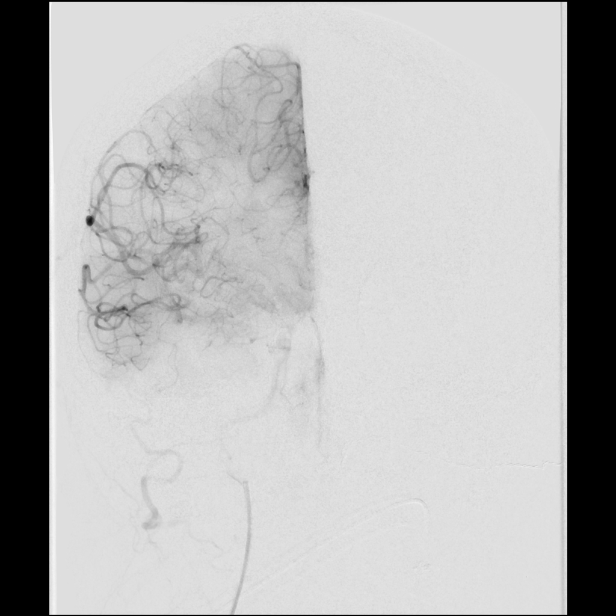

[Series 10: cerebral care 2 · 2 acquisitions, 1 frame shown (5 of 7)]
[im 1/2]
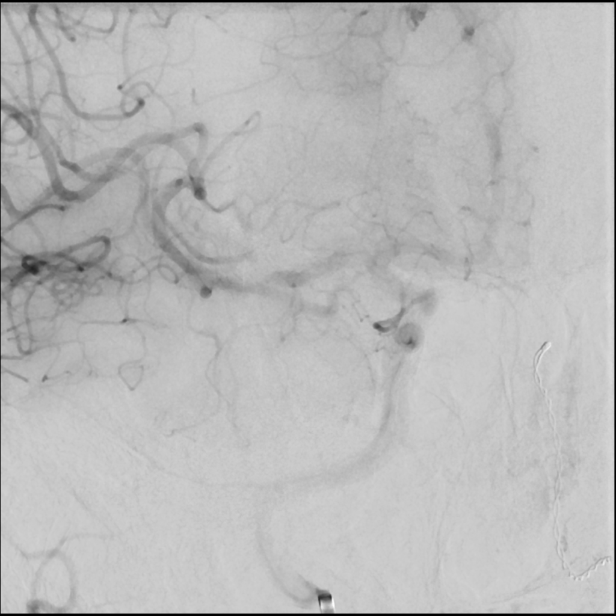

[Series 13: single · 1 of 2 slices shown (1 of 2)]
[im 1/2]
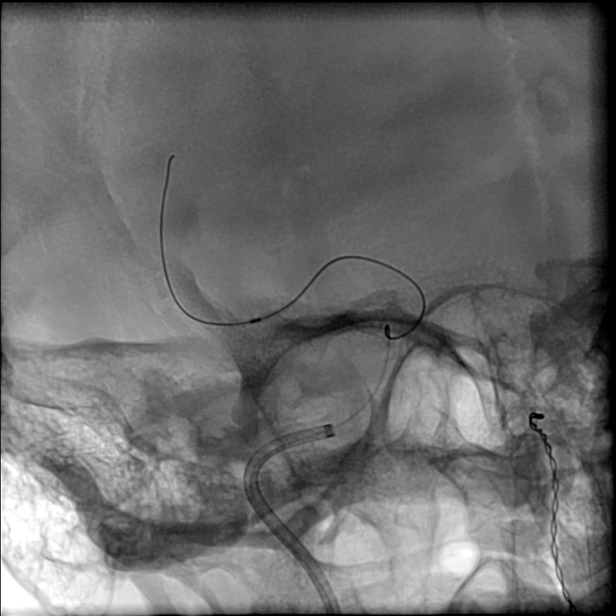

[Series 16: cerebral care 2 · 2 acquisitions, 1 frame shown (6 of 7)]
[im 1/2]
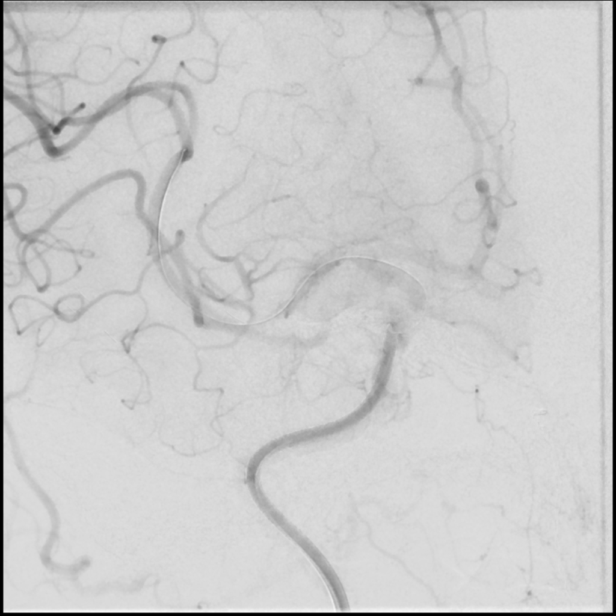

[Series 18: single · 1 of 2 slices shown (2 of 2)]
[im 1/2]
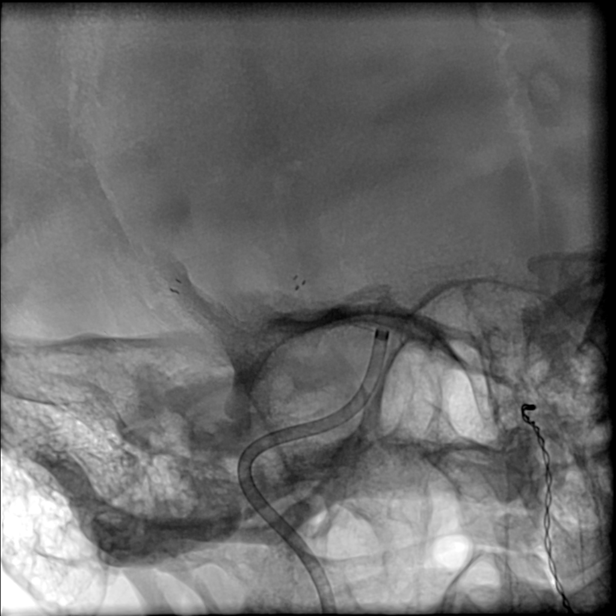

[Series 22: cerebral care 2 · 2 acquisitions, 1 frame shown (7 of 7)]
[im 1/2]
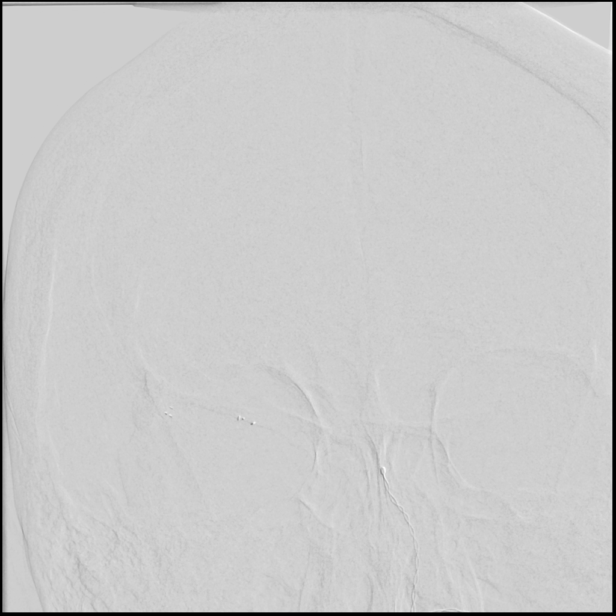

[10 of 24 positions shown; findings below may reference images not displayed]

MEDICATIONS:
Refer to anesthesia documentation.

ANESTHESIA/SEDATION:
The procedure was performed under general anesthesia

CONTRAST:  120 mL of Omnipaque 240 milligram/mL

FLUOROSCOPY TIME:  Fluoroscopy Time: 75 minutes 6 seconds (1,935
mGy).

COMPLICATIONS:
None immediate.
Using the modified Seldinger technique and a micropuncture kit,
access was gained to the distal right radial artery at the
anatomical snuffbox and a 6 French sheath was placed. Real-time
ultrasound guidance was utilized for vascular access including the
acquisition of a permanent ultrasound image documenting patency of
the accessed vessel. Slow intra arterial infusion of 5,000 RABADI
heparin, 5 mg Verapamil and 200 mcg nitroglicerin diluted in
patient's own blood was performed. No significant fluctuation in
patient's blood pressure seen. Then, a right radial artery roadmap
was obtained via sheath side port. Normal brachial artery branching
pattern seen. No significant anatomical variation. The right radial
artery caliber is adequate for vascular access.

Next, a 5 RABADI 2 glide catheter was navigated over a
0.035" Terumo Glidewire into the right subclavian artery under
fluoroscopic guidance. Frontal angiogram of the neck was obtained.

The catheter was advanced into the aortic arch. The catheter tip was
reformed. The catheter was then advanced into the left subclavian
artery. Frontal angiogram of the neck was obtained.

The catheter was subsequently placed into the left common carotid
artery. Frontal and lateral angiograms of the neck were obtained.
Under biplane roadmap, the catheter was then advanced into the left
internal carotid artery. Frontal and lateral angiograms of the head
were obtained.

The catheter was again placed into the right subclavian artery.
Frontal and lateral angiograms of the head were obtained. The the
catheter was then placed clear into the right common carotid artery.
Frontal and lateral angiograms of the neck were obtained. Under
biplane roadmap, the the catheter was then advanced into the right
internal carotid artery. Frontal and lateral angiograms of the head
were obtained.
FINDINGS: 1. Normal course and caliber of the cervical right vertebral artery.
2. Left subclavian artery angiograms showed no evidence of
opacification of the left vertebral artery, likely related to
occlusion.
3. Mild atherosclerotic changes of the left carotid bifurcation
without hemodynamically significant stenosis.
4. Severe stenosis of the P2 segment of the left fetal PCA.
5. Mild stenosis of a left M3/MCA posterior division branch.
6. No significant atherosclerotic changes or stenosis of the right
carotid bifurcation.
7. Atherosclerotic changes of the right carotid siphons with mild
stenosis at the paraclinoid segment.
8. Approximately 50% stenosis at the origin of the right M1/MCA
segment.
9. Approximately 50% stenosis at the origin of a right inferior
temporal branch.
10. Severe, hairline stenosis of the distal right M1/MCA segment,
involving the origin of both anterior and posterior division
branches. There is significant delay in opacification of the
anterior division branches.

PROCEDURE:
This into catheter was exchanged over the wire and under biplane
roadmap for a benchmark guide catheter which was placed in the
distal cervical segment of the right ICA. Magnified frontal and
lateral views of the head were obtained in the working projections.

Then, attempted navigation of a 1.25 x 10 mm RABADI balloon over a
0.014 inch Aristotle microguidewire proved unsuccessful due to
siphon tortuosity. A headway duo microcatheter was navigated over
the Aristotle with a wire into the right M2/MCA posterior division
branch, distal to the stenosis. The wire was exchanged for a zoom 14
exchange micro guidewire. The microcatheter was then exchanged for
the 1.25 x 10 mm RABADI balloon. Angioplasty was performed under
fluoroscopy at the distal right M1/MCA. Right internal carotid
artery angiograms with frontal lateral views showed in proved flow
in the distal right MCA branches.

Attempted navigation of a 2 mm resolute onyx balloon mounted stent
into the right M1/MCA proved unsuccessful due to siphon tortuosity.

Then, a 2 x 8 mm apex balloon was navigated to the level of
stenosis. Additional angioplasty was performed under fluoroscopy.
Right internal carotid angiograms with magnified frontal and lateral
views showed additional improvement of the degree of stenosis.

The headway duo microcatheter was again navigated over the wire into
the right M2/MCA posterior division branch. Subsequently, a 3 x 24
mm neuroform atlas stent was deployed in the distal right M1/MCA
segment extending to the proximal M2 posterior division branch.
Follow-up angiogram showed adequate stent positioning across the
stenosis with significant improvement of the degree of stenosis and
anterograde flow in the posterior division branch. Persistent slow
flow in the anterior division branch noted.

Attempted navigation of the headway duo over the 14 wire through the
stent proved unsuccessful. Attempted navigation of the apex balloon
also proved unsuccessful. The 2 mm apex balloon was then navigated
into the proximal right M1/MCA. Angioplasty was performed under
fluoroscopy. Follow-up angiograms showed mild improvement of the
proximal right M1/MCA stenosis (now approximately 35%). Further
attempts to navigate the balloon for the microcatheter through the
previously deployed stent proved unsuccessful. The catheter was
subsequently withdrawn. Follow-up angiograms with frontal and
lateral views showed stable position and opacification of the stent.
Delayed opacification of the anterior division branches noted,
although improved from baseline.

Flat panel CT of the head was obtained and post processed in a
separate workstation with concurrent attending physician
supervision. Selected images were sent to PACS. No evidence of
hemorrhagic complication.
IMPRESSION: 1. Severe intracranial atherosclerotic disease with severe hairline
stenosis of the distal right M1/MCA segment extending into the
origins of the superior and inferior divisions and severe stenosis
of the left P2/PCA. Mild (50%) stenosis of the proximal right M1/MCA
and at the origin of the anterior temporal branch.
2. Angioplasty and stenting performed for treatment of the area of
severe stenosis at the distal right M1/MCA segment with improvement
of the anterograde flow. Persistent delayed, although improved, flow
to the anterior division branch. No further angioplasty or stenting
performed due to lack of access through the recently deployed stent.
3. Angioplasty performed at the proximal right M1/MCA segment with
approximately 35% residual stenosis.

PLAN:
Continue on dual antiplatelet therapy for 6 month when stent patency
should be evaluated.

## 2021-05-11 SURGERY — IR WITH ANESTHESIA
Anesthesia: General

## 2021-05-11 MED ORDER — PHENYLEPHRINE HCL-NACL 10-0.9 MG/250ML-% IV SOLN
INTRAVENOUS | Status: DC | PRN
  Administered 2021-05-11: 25 ug/min via INTRAVENOUS

## 2021-05-11 MED ORDER — NORTRIPTYLINE HCL 10 MG PO CAPS: 10.0000 mg | ORAL_CAPSULE | Freq: Every day | ORAL | Status: DC

## 2021-05-11 MED ORDER — CLEVIDIPINE BUTYRATE 0.5 MG/ML IV EMUL
INTRAVENOUS | Status: AC
  Filled 2021-05-11: qty 50

## 2021-05-11 MED ORDER — HEPARIN (PORCINE) 25000 UT/250ML-% IV SOLN
550.0000 [IU]/h | INTRAVENOUS | Status: DC
  Administered 2021-05-11 (×2): 550 [IU]/h via INTRAVENOUS
  Filled 2021-05-11: qty 250

## 2021-05-11 MED ORDER — CLOPIDOGREL BISULFATE 75 MG PO TABS
75.0000 mg | ORAL_TABLET | Freq: Every day | ORAL | Status: DC
  Administered 2021-05-11 – 2021-05-15 (×5): 75 mg via ORAL
  Filled 2021-05-11 (×5): qty 1

## 2021-05-11 MED ORDER — ONDANSETRON HCL 4 MG/2ML IJ SOLN: 4.0000 mg | Freq: Four times a day (QID) | INTRAMUSCULAR | Status: DC | PRN

## 2021-05-11 MED ORDER — VERAPAMIL HCL 2.5 MG/ML IV SOLN: INTRA_ARTERIAL | Status: AC | PRN

## 2021-05-11 MED ORDER — INSULIN ASPART 100 UNIT/ML IJ SOLN
0.0000 [IU] | INTRAMUSCULAR | Status: DC
  Administered 2021-05-11: 3 [IU] via SUBCUTANEOUS
  Administered 2021-05-11: 2 [IU] via SUBCUTANEOUS
  Administered 2021-05-11 (×2): 3 [IU] via SUBCUTANEOUS
  Administered 2021-05-12: 5 [IU] via SUBCUTANEOUS
  Administered 2021-05-12: 2 [IU] via SUBCUTANEOUS
  Administered 2021-05-12 (×2): 3 [IU] via SUBCUTANEOUS

## 2021-05-11 MED ORDER — MELATONIN 3 MG PO TABS
3.0000 mg | ORAL_TABLET | Freq: Every evening | ORAL | Status: DC | PRN
  Administered 2021-05-13: 3 mg via ORAL
  Filled 2021-05-11 (×2): qty 1

## 2021-05-11 MED ORDER — LIDOCAINE HCL (CARDIAC) PF 100 MG/5ML IV SOSY
PREFILLED_SYRINGE | INTRAVENOUS | Status: DC | PRN
  Administered 2021-05-11: 40 mg via INTRAVENOUS

## 2021-05-11 MED ORDER — HYDRALAZINE HCL 20 MG/ML IJ SOLN
INTRAMUSCULAR | Status: AC
  Filled 2021-05-11: qty 1

## 2021-05-11 MED ORDER — LACTATED RINGERS IV SOLN: INTRAVENOUS | Status: DC

## 2021-05-11 MED ORDER — LIDOCAINE HCL (PF) 1 % IJ SOLN
INTRAMUSCULAR | Status: AC
  Filled 2021-05-11: qty 30

## 2021-05-11 MED ORDER — HEPARIN SODIUM (PORCINE) 1000 UNIT/ML IJ SOLN
INTRAMUSCULAR | Status: DC | PRN
  Administered 2021-05-11 (×2): 1000 [IU] via INTRAVENOUS

## 2021-05-11 MED ORDER — CLEVIDIPINE BUTYRATE 0.5 MG/ML IV EMUL
0.0000 mg/h | INTRAVENOUS | Status: DC
Start: 2021-05-11 — End: 2021-05-15

## 2021-05-11 MED ORDER — SUGAMMADEX SODIUM 200 MG/2ML IV SOLN
INTRAVENOUS | Status: DC | PRN
  Administered 2021-05-11: 200 mg via INTRAVENOUS

## 2021-05-11 MED ORDER — FENTANYL CITRATE (PF) 100 MCG/2ML IJ SOLN: 25.0000 ug | INTRAMUSCULAR | Status: DC | PRN

## 2021-05-11 MED ORDER — ROCURONIUM BROMIDE 10 MG/ML (PF) SYRINGE
PREFILLED_SYRINGE | INTRAVENOUS | Status: DC | PRN
  Administered 2021-05-11: 20 mg via INTRAVENOUS
  Administered 2021-05-11: 100 mg via INTRAVENOUS
  Administered 2021-05-11: 20 mg via INTRAVENOUS

## 2021-05-11 MED ORDER — HYDRALAZINE HCL 20 MG/ML IJ SOLN
5.0000 mg | Freq: Once | INTRAMUSCULAR | Status: AC
  Administered 2021-05-11: 5 mg via INTRAVENOUS

## 2021-05-11 MED ORDER — CITALOPRAM HYDROBROMIDE 10 MG PO TABS: 10.0000 mg | ORAL_TABLET | Freq: Every day | ORAL | Status: DC

## 2021-05-11 MED ORDER — HEPARIN SODIUM (PORCINE) 1000 UNIT/ML IJ SOLN
INTRAMUSCULAR | Status: AC
  Filled 2021-05-11: qty 1

## 2021-05-11 MED ORDER — ASPIRIN EC 81 MG PO TBEC
81.0000 mg | DELAYED_RELEASE_TABLET | Freq: Every day | ORAL | Status: DC
  Administered 2021-05-11 – 2021-05-15 (×5): 81 mg via ORAL
  Filled 2021-05-11 (×5): qty 1

## 2021-05-11 MED ORDER — ONDANSETRON HCL 4 MG/2ML IJ SOLN: 4.0000 mg | Freq: Once | INTRAMUSCULAR | Status: DC | PRN

## 2021-05-11 MED ORDER — HEPARIN (PORCINE) 25000 UT/250ML-% IV SOLN
500.0000 [IU]/h | INTRAVENOUS | Status: DC
  Filled 2021-05-11: qty 250

## 2021-05-11 MED ORDER — PROPOFOL 10 MG/ML IV BOLUS
INTRAVENOUS | Status: DC | PRN
  Administered 2021-05-11: 100 mg via INTRAVENOUS

## 2021-05-11 MED ORDER — EPHEDRINE SULFATE-NACL 50-0.9 MG/10ML-% IV SOSY
PREFILLED_SYRINGE | INTRAVENOUS | Status: DC | PRN
  Administered 2021-05-11 (×3): 5 mg via INTRAVENOUS

## 2021-05-11 MED ORDER — CHLORHEXIDINE GLUCONATE CLOTH 2 % EX PADS
6.0000 | MEDICATED_PAD | Freq: Every day | CUTANEOUS | Status: DC
  Administered 2021-05-11 – 2021-05-15 (×5): 6 via TOPICAL

## 2021-05-11 MED ORDER — NITROGLYCERIN 1 MG/10 ML FOR IR/CATH LAB
INTRA_ARTERIAL | Status: AC
  Filled 2021-05-11: qty 10

## 2021-05-11 MED ORDER — ENOXAPARIN SODIUM 40 MG/0.4ML IJ SOSY: 40.0000 mg | PREFILLED_SYRINGE | Freq: Every day | INTRAMUSCULAR | Status: DC

## 2021-05-11 MED ORDER — FENTANYL CITRATE (PF) 100 MCG/2ML IJ SOLN
INTRAMUSCULAR | Status: DC | PRN
  Administered 2021-05-11: 50 ug via INTRAVENOUS

## 2021-05-11 MED ORDER — IOHEXOL 240 MG/ML SOLN
INTRAMUSCULAR | Status: AC
  Administered 2021-05-11: 120 mL
  Filled 2021-05-11: qty 200

## 2021-05-11 MED ORDER — LEVOTHYROXINE SODIUM 25 MCG PO TABS
125.0000 ug | ORAL_TABLET | Freq: Every day | ORAL | Status: DC
  Administered 2021-05-11 – 2021-05-15 (×5): 125 ug via ORAL
  Filled 2021-05-11 (×5): qty 1

## 2021-05-11 MED ORDER — CLEVIDIPINE BUTYRATE 0.5 MG/ML IV EMUL
INTRAVENOUS | Status: DC | PRN
  Administered 2021-05-11: 1 mg/h via INTRAVENOUS

## 2021-05-11 MED ORDER — ONDANSETRON HCL 4 MG/2ML IJ SOLN
INTRAMUSCULAR | Status: DC | PRN
  Administered 2021-05-11: 4 mg via INTRAVENOUS

## 2021-05-11 MED ORDER — PHENYLEPHRINE 40 MCG/ML (10ML) SYRINGE FOR IV PUSH (FOR BLOOD PRESSURE SUPPORT)
PREFILLED_SYRINGE | INTRAVENOUS | Status: DC | PRN
  Administered 2021-05-11: 80 ug via INTRAVENOUS

## 2021-05-11 MED ORDER — GLYCOPYRROLATE PF 0.2 MG/ML IJ SOSY
PREFILLED_SYRINGE | INTRAMUSCULAR | Status: DC | PRN
  Administered 2021-05-11: .2 mg via INTRAVENOUS

## 2021-05-11 MED ORDER — ACETAMINOPHEN 325 MG PO TABS: 650.0000 mg | ORAL_TABLET | Freq: Four times a day (QID) | ORAL | Status: DC | PRN

## 2021-05-11 MED ORDER — SODIUM CHLORIDE 0.9 % IV SOLN: INTRAVENOUS | Status: DC | PRN

## 2021-05-11 MED ORDER — ORAL CARE MOUTH RINSE: 15.0000 mL | Freq: Once | OROMUCOSAL | Status: AC

## 2021-05-11 MED ORDER — CHLORHEXIDINE GLUCONATE 0.12 % MT SOLN
OROMUCOSAL | Status: AC
  Administered 2021-05-11: 15 mL via OROMUCOSAL
  Filled 2021-05-11: qty 15

## 2021-05-11 MED ORDER — CHLORHEXIDINE GLUCONATE 0.12 % MT SOLN: 15.0000 mL | Freq: Once | OROMUCOSAL | Status: AC

## 2021-05-11 MED ORDER — VERAPAMIL HCL 2.5 MG/ML IV SOLN
INTRAVENOUS | Status: AC
  Filled 2021-05-11: qty 2

## 2021-05-11 MED ORDER — ATORVASTATIN CALCIUM 80 MG PO TABS
80.0000 mg | ORAL_TABLET | Freq: Every day | ORAL | Status: DC
  Administered 2021-05-11 – 2021-05-14 (×4): 80 mg via ORAL
  Filled 2021-05-11 (×4): qty 1

## 2021-05-11 NOTE — Transfer of Care (Signed)
Immediate Anesthesia Transfer of Care Note  Patient: Madison Davidson  Procedure(s) Performed: IR WITH ANESTHESIA - INTRACRANIAL STENT  Patient Location: PACU  Anesthesia Type:General  Level of Consciousness: drowsy and patient cooperative  Airway & Oxygen Therapy: Patient Spontanous Breathing  Post-op Assessment: Report given to RN and Post -op Vital signs reviewed and stable  Post vital signs: Reviewed and stable  Last Vitals:  Vitals Value Taken Time  BP 144/45   Temp    Pulse 63   Resp 14   SpO2 97     Last Pain:  Vitals:   05/11/21 1242  TempSrc: Axillary  PainSc: 0-No pain         Complications: No notable events documented.

## 2021-05-11 NOTE — H&P (Signed)
History and Physical  Madison Davidson NUU:[id number] DOB: 1951-09-02 DOA: 05/11/2021  Referring physician: Dr. Eliseo Squires, [facility]. PCP: [facility]  Outpatient Specialists: Cardiology, neurology. Patient coming from: Home through a direct transfer from [facility] to [facility].    Chief Complaint: Slurring of speech.  HPI: Madison Davidson is a 70 y.o. female with medical history significant for type 2 diabetes, hypertension, hyperlipidemia, hypothyroidism, obesity, coronary artery disease, prior CVA, who initially presented to [facility] ED on 05/08/21 due to transient slurring of speech.  She was admitted for stroke work-up.  Work-up revealed severe symptomatic right MCA stenosis.  At [facility] she was followed by neurology who recommended transfer to [facility] for urgent but not emergent intervention, possible intracranial stenting of the right MCA origin and bifurcation.  Patient was seen and examined in her room at [facility] 3W unit.  She has no new complaints at the time of this visit.  States she is sleepy.  Her speech appears improved.  ED Course:  Direct transfer from [facility] to [facility].  Review of Systems: Review of systems as noted in the HPI. All other systems reviewed and are negative.   Past Medical History:  Diagnosis Date   Cancer ([place])    skin   Coronary artery disease    Diabetes mellitus without complication ([place])    Hypertension    Stroke [facility])    Past Surgical History:  Procedure Laterality Date   ABDOMINAL HYSTERECTOMY      Social History:  reports that she has never smoked. She has never used smokeless tobacco. She reports that she does not drink alcohol and does not use drugs.   No Known Allergies  Family History  Problem Relation Age of Onset   Heart disease Mother    Heart disease Father       Prior to Admission medications   Medication Sig Start Date End Date Taking? Authorizing Provider  aspirin EC 81 MG tablet Take 81 mg by mouth as needed.    [provider]  atorvastatin (LIPITOR) 20 MG tablet Take 4 tablets (80 mg total) by mouth at bedtime. 05/10/21   Geradine Girt, DO  citalopram (CELEXA) 10 MG tablet Take 10 mg by mouth daily.    Marguerita Merles, MD  clopidogrel (PLAVIX) 75 MG tablet Take 1 tablet (75 mg total) by mouth daily. 05/11/21   Geradine Girt, DO  levothyroxine (SYNTHROID) 125 MCG tablet Take 1 tablet (125 mcg total) by mouth daily at 6 (six) AM. 05/11/21   Geradine Girt, DO  nortriptyline (PAMELOR) 10 MG capsule Take 10 mg by mouth at bedtime.     Marguerita Merles, MD    Physical Exam: Ht 5\' 2"  (1.575 m)   Wt 81.5 kg   BMI 32.86 kg/m   General: 70 y.o. year-old female well developed well nourished in no acute distress.  Alert and oriented x3. Cardiovascular: Regular rate and rhythm with no rubs or gallops.  No thyromegaly or JVD noted.  No lower extremity edema. 2/4 pulses in all 4 extremities. Respiratory: Clear to auscultation with no wheezes or rales. Good inspiratory effort. Abdomen: Soft nontender nondistended with normal bowel sounds x4 quadrants. Muskuloskeletal: No cyanosis, clubbing or edema noted bilaterally Neuro: CN II-XII intact, strength, sensation, reflexes Skin: No ulcerative lesions noted or rashes Psychiatry: Judgement and insight appear normal. Mood is appropriate for condition and setting          Labs on Admission:  Basic Metabolic Panel:  Recent Labs  Lab 05/08/21 1258 05/09/21 1306  NA 137 135  K 4.0 4.0  CL 104 103  CO2 22 25  GLUCOSE 232* 262*  BUN 23 18  CREATININE 1.07* 0.75  CALCIUM 9.2 8.9  MG  --  2.0  PHOS  --  3.0   Liver Function Tests: Recent Labs  Lab 05/09/21 1306  ALBUMIN 3.6   No results for input(s): LIPASE, AMYLASE in the last 168 hours. No results for input(s): AMMONIA in the last 168 hours. CBC: Recent Labs  Lab 05/08/21 1258 05/09/21 1306  WBC 9.0 8.2  HGB 11.7* 12.2  HCT 37.0 37.6  MCV 79.1* 78.8*  PLT 309 320   Cardiac Enzymes: No results for  input(s): CKTOTAL, CKMB, CKMBINDEX, TROPONINI in the last 168 hours.  BNP (last 3 results) No results for input(s): BNP in the last 8760 hours.  ProBNP (last 3 results) No results for input(s): PROBNP in the last 8760 hours.  CBG: Recent Labs  Lab 05/09/21 2214 05/10/21 0851 05/10/21 1253 05/10/21 1624 05/10/21 2112  GLUCAP 102* 258* 244* 108* 208*    Radiological Exams on Admission: CT ANGIO HEAD W OR WO CONTRAST  Result Date: 05/09/2021 CLINICAL DATA:  Acute stroke on MRI brain EXAM: CT ANGIOGRAPHY HEAD TECHNIQUE: Multidetector CT imaging of the head was performed using the standard protocol during bolus administration of intravenous contrast. Multiplanar CT image reconstructions and MIPs were obtained to evaluate the vascular anatomy. CONTRAST:  67mL OMNIPAQUE IOHEXOL 350 MG/ML SOLN COMPARISON:  CT 05/08/2021, correlation made with MRI brain 05/08/2021 FINDINGS: CT HEAD Brain: Evolving recent infarction is again identified within the right corona radiata. Small area of right frontal cortical involvement on the MRI is not visible by CT. There is no acute intracranial hemorrhage. No significant mass effect. Chronic infarct of the left basal ganglia and adjacent white matter with ex vacuo dilatation of the adjacent lateral ventricle. No hydrocephalus. No extra-axial collection. Vascular: There is atherosclerotic calcification at the skull base. Skull: Calvarium is unremarkable. Sinuses/Orbits: No acute finding. Other: None. CTA HEAD Anterior circulation: Intracranial internal carotid arteries are patent with calcified plaque but no significant stenosis. Anterior cerebral arteries are patent. There is eccentric plaque at the origin of the right MCA with marked stenosis. High-grade stenosis is present at the right MCA bifurcation. Left MCA is patent. There is moderate stenosis of an anterior division M2 branch. Posterior circulation: Dominant intracranial right vertebral artery is patent.  Proximal intracranial left vertebral artery is irregular with diminished enhancement. There is improved opacification more distally beginning at the PICA origin, which may reflect retrograde flow. Basilar artery is patent. Major cerebellar artery origins are patent. A left posterior communicating artery is present. There is irregularity of the left P2 PCA with moderate to marked stenosis. Venous sinuses: As permitted by contrast timing, patent. Review of the MIP images confirms the above findings. IMPRESSION: Evolving recent infarction of the right corona radiata. No acute intracranial hemorrhage. 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 Moderate left M2 MCA branch stenosis. Diffusely irregular left vertebral artery with diminished flow. There is improvement beginning at the PICA origin likely from retrograde flow. Irregular left P2 PCA with moderate to marked stenosis. Electronically Signed   By: Macy Mis M.D.   On: 05/09/2021 14:35   ECHOCARDIOGRAM COMPLETE  Result Date: 05/09/2021    ECHOCARDIOGRAM REPORT   Patient Name:   Madison Davidson Date of Exam: 05/09/2021 Medical Rec #:  [id number]  Height:       62.0 in Accession #:    [id number]       Weight:       175.5 lb Date of Birth:  September 04, 1951       BSA:          1.808 m Patient Age:    37 years         BP:           179/73 mmHg Patient Gender: F                HR:           73 bpm. Exam Location:  [facility] Procedure: 2D Echo, Cardiac Doppler, Color Doppler and Saline Contrast Bubble            Study Indications:     Stroke I63.9  History:         Patient has no prior history of Echocardiogram examinations.                  Stroke; Risk Factors:Diabetes.  Sonographer:     Sherrie Sport RDCS (AE) Referring Phys:  [place] Diagnosing Phys: Ida Rogue MD IMPRESSIONS  1. Left ventricular ejection fraction, by estimation, is 60 to 65%. The left ventricle has normal function. The left ventricle has no regional wall motion  abnormalities. Left ventricular diastolic parameters are indeterminate.  2. Right ventricular systolic function is normal. The right ventricular size is normal. FINDINGS  Left Ventricle: Left ventricular ejection fraction, by estimation, is 60 to 65%. The left ventricle has normal function. The left ventricle has no regional wall motion abnormalities. The left ventricular internal cavity size was normal in size. There is  no left ventricular hypertrophy. Left ventricular diastolic parameters are indeterminate. Right Ventricle: The right ventricular size is normal. No increase in right ventricular wall thickness. Right ventricular systolic function is normal. Left Atrium: Left atrial size was normal in size. Right Atrium: Right atrial size was normal in size. Pericardium: There is no evidence of pericardial effusion. Mitral Valve: The mitral valve was not well visualized. No evidence of mitral valve regurgitation. No evidence of mitral valve stenosis. Tricuspid Valve: The tricuspid valve is not well visualized. Tricuspid valve regurgitation is not demonstrated. No evidence of tricuspid stenosis. Aortic Valve: The aortic valve was not well visualized. Aortic valve regurgitation is not visualized. No aortic stenosis is present. Pulmonic Valve: The pulmonic valve was not well visualized. Pulmonic valve regurgitation is not visualized. No evidence of pulmonic stenosis. Aorta: The aortic root is normal in size and structure. Venous: The inferior vena cava is normal in size with greater than 50% respiratory variability, suggesting right atrial pressure of 3 mmHg. IAS/Shunts: No atrial level shunt detected by color flow Doppler. Agitated saline contrast was given intravenously to evaluate for intracardiac shunting.  LEFT VENTRICLE PLAX 2D LVIDd:         3.70 cm LVIDs:         2.30 cm LV PW:         1.20 cm LV IVS:        1.30 cm LVOT diam:     2.10 cm LVOT Area:     3.46 cm  LEFT ATRIUM         Index LA diam:    3.20 cm  1.77 cm/m                        PULMONIC  VALVE AORTA                 PV Vmax:        0.99 m/s Ao Root diam: 3.20 cm PV Peak grad:   3.9 mmHg                       RVOT Peak grad: 5 mmHg   SHUNTS Systemic Diam: 2.10 cm Ida Rogue MD Electronically signed by Ida Rogue MD Signature Date/Time: 05/09/2021/5:06:01 PM    Final     EKG: I independently viewed the EKG done and my findings are as followed: None available at the time of this visit.  Assessment/Plan Present on Admission:  Intracranial atherosclerosis  Active Problems:   Intracranial atherosclerosis  Severe symptomatic right MCA stenosis Presented with transient slurring of speech at [facility] Work-up revealed severe symptomatic right MCA stenosis Seen by neurology at [facility] with recommendation for transfer to [facility] for possible intracranial stenting of the right MCA origin and bifurcation. IR consult Please consult neurology in the morning to assist with the management. Per neurology, DAPT aspirin 81 mg daily and Plavix 75 mg daily, continue Lipitor 80 mg daily. Patient may need a PCSK9 inhibitor outpatient, LDL not at goal. Allow permissive hypertension up to 220/120.  Hyperlipidemia LDL 119 on 05/09/2021 Goal LDL less than 70. Continue high intensity statin Lipitor 80 mg daily. May need a PCSK9 inhibitor as stated above.  Type 2 diabetes with hyperglycemia Hemoglobin A1c 7.1 on 05/09/2021 Goal A1c less than 7.0 Insulin sliding scale  Hypothyroidism Resume home levothyroxine  Chronic anxiety/depression Resume home regimen.  Obesity BMI 32 Recommend weight loss outpatient with regular physical activity and healthy dieting.   DVT prophylaxis: SCDs.  Code Status: Full code.  Family Communication: None at bedside.  Disposition Plan: Admitted to telemetry medical.  Consults called: IR, please consult neurology in the morning.  Admission status: Inpatient status.  Patient will require at least 2 midnights for  further evaluation and treatment of present condition.   Status is: Inpatient    Dispo:  Patient From: Home  Planned Disposition: Home, possibly on 05/13/2021 or when neurology and IR sign off.  Medically stable for discharge: No         Kayleen Memos MD [facility] Pager [phone]  If 7PM-7AM, please contact night-coverage [email or web address] Password [email or web address]  05/11/2021, 1:26 AM

## 2021-05-11 NOTE — Anesthesia Procedure Notes (Signed)
Procedure Name: Intubation Date/Time: 05/11/2021 1:31 PM Performed by: Thelma Comp, CRNA Pre-anesthesia Checklist: Patient identified, Emergency Drugs available, Suction available and Patient being monitored Patient Re-evaluated:Patient Re-evaluated prior to induction Oxygen Delivery Method: Circle System Utilized Preoxygenation: Pre-oxygenation with 100% oxygen Induction Type: IV induction Ventilation: Mask ventilation without difficulty Laryngoscope Size: Mac and 3 Grade View: Grade I Tube type: Oral Tube size: 7.0 mm Number of attempts: 1 Airway Equipment and Method: Stylet Placement Confirmation: ETT inserted through vocal cords under direct vision, positive ETCO2 and breath sounds checked- equal and bilateral Secured at: 22 cm Tube secured with: Tape Dental Injury: Teeth and Oropharynx as per pre-operative assessment

## 2021-05-11 NOTE — Progress Notes (Signed)
1800:  Patient arrive to 4NICU from PACU.  Right radial TR Band assess, per PACU RN, 6 cc in band.  Level 0 and no hematoma.  Arm elevated.  Patient is alert and oriented at this time.  No belongings bought up from PACU.  RN to continue to monitor.

## 2021-05-11 NOTE — Anesthesia Procedure Notes (Signed)
Arterial Line Insertion Start/End6/16/2022 12:50 PM, 05/11/2021 12:55 PM Performed by: Betha Loa, CRNA, CRNA  Patient location: Pre-op. Preanesthetic checklist: patient identified, IV checked, site marked, risks and benefits discussed, surgical consent, monitors and equipment checked, pre-op evaluation, timeout performed and anesthesia consent Lidocaine 1% used for infiltration Left, radial was placed Catheter size: 20 Fr Hand hygiene performed  and maximum sterile barriers used   Attempts: 1 Procedure performed without using ultrasound guided technique. Following insertion, dressing applied and Biopatch. Post procedure assessment: normal and unchanged  Patient tolerated the procedure well with no immediate complications.

## 2021-05-11 NOTE — Progress Notes (Signed)
OT Cancellation Note  Patient Details Name: MELEANA COMMERFORD MRN: [id number] DOB: 1951-03-31   Cancelled Treatment:    Reason Eval/Treat Not Completed: Patient at procedure or test/ unavailable  Kallin Henk D Thais Silberstein 05/11/2021, 12:21 PM 05/11/2021  Rich, OTR/L  Acute Rehabilitation Services  Office:  [phone]

## 2021-05-11 NOTE — Progress Notes (Signed)
ANTICOAGULATION CONSULT NOTE - Follow Up Consult  Pharmacy Consult for IV Heparin Indication:  Post interventional neuroradiology procedure  No Known Allergies  Patient Measurements: Height: 5\' 2"  (157.5 cm) Weight: 81.5 kg (179 lb 10.8 oz) IBW/kg (Calculated) : 50.1 Heparin Dosing Weight: 68.6 kg  Vital Signs: Temp: 96.8 F (36 C) (06/16 1242) Temp Source: Axillary (06/16 1242) BP: 197/56 (06/16 1301) Pulse Rate: 64 (06/16 1242)  Labs: Recent Labs    05/09/21 1306 05/11/21 0206  HGB 12.2 12.0  HCT 37.6 38.3  PLT 320 340  CREATININE 0.75 0.82    Estimated Creatinine Clearance: 62.3 mL/min (by C-G formula based on SCr of 0.82 mg/dL).  Assessment: 70 years of age female s/p right MCA intracranial stent. Pharmacy consulted to start IV Heparin for low target heparin level of 0.1 to 0.25 units/mL.  CBC is stable from this AM to PM. SCr stable at 0.75. Heparin was initiated in the PACU and currently running at 500 units/hr (~7.2 units/kg/hr using heparin dosing weight).    Goal of Therapy:  Heparin level 0.1 to 0.25 units/ml Monitor platelets by anticoagulation protocol: Yes   Plan:  Increase Heparin to 550 units/hr (8 units/kg/hr).  Heparin level in 6-8 hours.   Sloan Leiter, PharmD, BCPS, BCCCP Clinical Pharmacist Please refer to [facility] for [place] numbers 05/11/2021,4:21 PM

## 2021-05-11 NOTE — Plan of Care (Signed)

## 2021-05-11 NOTE — Consult Note (Addendum)
Stroke Neurology Consultation Note  Consult Requested by: Dr. Roger Shelter  Reason for Consult: Stroke  Consult Date: 05/11/21   The history was obtained from the patient, daughter and medical chart.  During history and examination, all items were able to obtain unless otherwise noted.  History of Present Illness:  Madison Davidson is a 70 y.o. Caucasian female with PMH of hypertension, hyperlipidemia, diabetes, obesity admitted to [facility] for a fall with brief loss of consciousness and then developed transient left leg numbness and slurred speech.  Stroke work-up showed right frontal white matter infarct on CT.  MRI confirmed right CR/SO infarcts, as well as chronic left BG and occipital infarcts.  CTA head showed right MCA severe stenosis, left M2 and P2 moderate stenosis.  Carotid Doppler negative.  EF 60 to 65%, A1c 7.1, LDL 119.  Creatinine 0.82.  She was put on aspirin Plavix and Lipitor and transferred to [facility] for planned right MCA intracranial stenting.  LSN: 6/11 tPA Given: No: Outside window mRS: 0-1  Past Medical History:  Diagnosis Date   Cancer ([place])    skin   Coronary artery disease    Diabetes mellitus without complication ([place])    Hypertension    Stroke [facility])     Past Surgical History:  Procedure Laterality Date   ABDOMINAL HYSTERECTOMY      Family History  Problem Relation Age of Onset   Heart disease Mother    Heart disease Father     Social History:  reports that she has never smoked. She has never used smokeless tobacco. She reports that she does not drink alcohol and does not use drugs.  Allergies: No Known Allergies  No current facility-administered medications on file prior to encounter.   Current Outpatient Medications on File Prior to Encounter  Medication Sig Dispense Refill   aspirin EC 81 MG tablet Take 81 mg by mouth as needed.     atorvastatin (LIPITOR) 20 MG tablet Take 4 tablets (80 mg total) by mouth at bedtime.     citalopram (CELEXA) 10 MG  tablet Take 10 mg by mouth daily.     clopidogrel (PLAVIX) 75 MG tablet Take 1 tablet (75 mg total) by mouth daily.     levothyroxine (SYNTHROID) 125 MCG tablet Take 1 tablet (125 mcg total) by mouth daily at 6 (six) AM.     nortriptyline (PAMELOR) 10 MG capsule Take 10 mg by mouth at bedtime.       Review of Systems: A full ROS was attempted today and was able to be performed.  Systems assessed include - Constitutional, Eyes, HENT, Respiratory, Cardiovascular, Gastrointestinal, Genitourinary, Integument/breast, Hematologic/lymphatic, Musculoskeletal, Neurological, Behavioral/Psych, Endocrine, Allergic/Immunologic - with pertinent responses as per HPI.  Physical Examination: Temp:  [96.8 F (36 C)-98.5 F (36.9 C)] 96.8 F (36 C) (06/16 1242) Pulse Rate:  [60-73] 64 (06/16 1242) Resp:  [15-18] 18 (06/16 1242) BP: (137-215)/(43-131) 197/56 (06/16 1301) SpO2:  [96 %-99 %] 97 % (06/16 1242) Weight:  [81.5 kg] 81.5 kg (06/16 0046)  General - well nourished, well developed, in no apparent distress.    Ophthalmologic - fundi not visualized due to noncooperation.    Cardiovascular - regular rhythm and rate  Mental Status -  Level of arousal and orientation to time, place, and person were intact. Language including expression, naming, repetition, comprehension, reading, and writing was assessed and found intact.  Cranial Nerves II - XII - II - Vision intact OU. III, IV, VI - Extraocular movements intact. V -  Facial sensation intact bilaterally. VII - Facial movement intact bilaterally. VIII - Hearing & vestibular intact bilaterally. X - Palate elevates symmetrically. XI - Chin turning & shoulder shrug intact bilaterally. XII - Tongue protrusion intact.  Motor Strength - The patient's strength was normal in all extremities and pronator drift was absent.   Motor Tone & Bulk - Muscle tone was assessed at the neck and appendages and was normal.  Bulk was normal and fasciculations were  absent.   Reflexes - The patient's reflexes were normal in all extremities and she had no pathological reflexes.  Sensory - Light touch, temperature/pinprick were assessed and were normal.    Coordination - The patient had normal movements in the hands with no ataxia or dysmetria.  Tremor was absent.  Gait and Station - deferred  Data Reviewed: CT ANGIO HEAD W OR WO CONTRAST  Result Date: 05/09/2021 CLINICAL DATA:  Acute stroke on MRI brain EXAM: CT ANGIOGRAPHY HEAD TECHNIQUE: Multidetector CT imaging of the head was performed using the standard protocol during bolus administration of intravenous contrast. Multiplanar CT image reconstructions and MIPs were obtained to evaluate the vascular anatomy. CONTRAST:  14mL OMNIPAQUE IOHEXOL 350 MG/ML SOLN COMPARISON:  CT 05/08/2021, correlation made with MRI brain 05/08/2021 FINDINGS: CT HEAD Brain: Evolving recent infarction is again identified within the right corona radiata. Small area of right frontal cortical involvement on the MRI is not visible by CT. There is no acute intracranial hemorrhage. No significant mass effect. Chronic infarct of the left basal ganglia and adjacent white matter with ex vacuo dilatation of the adjacent lateral ventricle. No hydrocephalus. No extra-axial collection. Vascular: There is atherosclerotic calcification at the skull base. Skull: Calvarium is unremarkable. Sinuses/Orbits: No acute finding. Other: None. CTA HEAD Anterior circulation: Intracranial internal carotid arteries are patent with calcified plaque but no significant stenosis. Anterior cerebral arteries are patent. There is eccentric plaque at the origin of the right MCA with marked stenosis. High-grade stenosis is present at the right MCA bifurcation. Left MCA is patent. There is moderate stenosis of an anterior division M2 branch. Posterior circulation: Dominant intracranial right vertebral artery is patent. Proximal intracranial left vertebral artery is irregular  with diminished enhancement. There is improved opacification more distally beginning at the PICA origin, which may reflect retrograde flow. Basilar artery is patent. Major cerebellar artery origins are patent. A left posterior communicating artery is present. There is irregularity of the left P2 PCA with moderate to marked stenosis. Venous sinuses: As permitted by contrast timing, patent. Review of the MIP images confirms the above findings. IMPRESSION: Evolving recent infarction of the right corona radiata. No acute intracranial hemorrhage. 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 Moderate left M2 MCA branch stenosis. Diffusely irregular left vertebral artery with diminished flow. There is improvement beginning at the PICA origin likely from retrograde flow. Irregular left P2 PCA with moderate to marked stenosis. Electronically Signed   By: Macy Mis M.D.   On: 05/09/2021 14:35   CT Head Wo Contrast  Result Date: 05/08/2021 CLINICAL DATA:  TIA.  Weakness and left knee pain EXAM: CT HEAD WITHOUT CONTRAST TECHNIQUE: Contiguous axial images were obtained from the base of the skull through the vertex without intravenous contrast. COMPARISON:  CT head 09/13/2017 FINDINGS: Brain: Ventricle size and cerebral volume normal for age. Ill-defined hypodensities in the right frontal white matter are new since the prior study. These are compatible with infarct of indeterminate age, possibly subacute. Well-defined hypodensity left basal ganglia and internal  capsule anteriorly compatible with chronic infarct. This was not present previously. Negative for hemorrhage or mass. Vascular: Negative for hyperdense vessel Skull: Negative Sinuses/Orbits: Negative Other: None IMPRESSION: Ill-defined hypodensities right frontal white matter compatible with ischemia of indeterminate age. Possible subacute Chronic infarct left basal ganglia No acute hemorrhage. Electronically Signed   By: Franchot Gallo M.D.   On:  05/08/2021 13:39   MR BRAIN WO CONTRAST  Result Date: 05/08/2021 CLINICAL DATA:  Weakness. EXAM: MRI HEAD WITHOUT CONTRAST TECHNIQUE: Multiplanar, multiecho pulse sequences of the brain and surrounding structures were obtained without intravenous contrast. COMPARISON:  Head CT 05/08/2021 FINDINGS: Brain: There are acute to early subacute infarcts involving white matter in the right frontal lobe primarily at the level of the corona radiata, and there is also a single subcentimeter acute or early subacute cortical infarct laterally in the right frontal lobe. There is a chronic infarct in the left basal ganglia with associated chronic blood products. There is also a small chronic cortical infarct in the posteroinferior left occipital lobe. Mild cerebral atrophy is within normal limits for age. No mass, midline shift, or extra-axial fluid collection is evident. Vascular: Major intracranial vascular flow voids are preserved. Skull and upper cervical spine: Unremarkable bone marrow signal. Sinuses/Orbits: Unremarkable orbits. Paranasal sinuses and mastoid air cells are clear. Other: None. IMPRESSION: 1. Acute to early subacute right frontal lobe infarcts. 2. 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 Electronically Signed   By: Logan Bores M.D.   On: 05/08/2021 16:58   US Carotid Bilateral (at [facility] and [facility] only)  Result Date: 05/09/2021 CLINICAL DATA:  70 year old female with a history of CVA EXAM: BILATERAL CAROTID DUPLEX ULTRASOUND TECHNIQUE: Pearline Cables scale imaging, color Doppler and duplex ultrasound were performed of bilateral carotid and vertebral arteries in the neck. COMPARISON:  None. FINDINGS: Criteria: Quantification of carotid stenosis is based on velocity parameters that correlate the residual internal carotid diameter with NASCET-based stenosis levels, using the diameter of the distal internal carotid lumen as the denominator for stenosis measurement. The following velocity measurements were  obtained: RIGHT ICA:  Systolic 160 cm/sec, Diastolic 19 cm/sec CCA:  109 cm/sec SYSTOLIC ICA/CCA RATIO:  1.5 ECA:  128 cm/sec LEFT ICA:  Systolic 95 cm/sec, Diastolic 11 cm/sec CCA:  84 cm/sec SYSTOLIC ICA/CCA RATIO:  1.1 ECA:  157 cm/sec Right Brachial SBP: Not acquired Left Brachial SBP: Not acquired RIGHT CAROTID ARTERY: No significant calcified disease of the right common carotid artery. Intermediate waveform maintained. Heterogeneous plaque without significant calcifications at the right carotid bifurcation. Low resistance waveform of the right ICA. No significant tortuosity. RIGHT VERTEBRAL ARTERY: Antegrade flow with low resistance waveform. LEFT CAROTID ARTERY: No significant calcified disease of the left common carotid artery. Intermediate waveform maintained. Heterogeneous plaque at the left carotid bifurcation without significant calcifications. Low resistance waveform of the left ICA. LEFT VERTEBRAL ARTERY: Left vertebral artery not identified. IMPRESSION: Color duplex indicates moderate heterogeneous plaque with no hemodynamically significant stenosis by duplex criteria in the extracranial cerebrovascular circulation. Left vertebral artery waveform not identified. Signed, Dulcy Fanny. Dellia Nims, RPVI [facility] Electronically Signed   By: Corrie Mckusick D.O.   On: 05/09/2021 08:19   DG Knee Complete 4 Views Left  Result Date: 05/08/2021 CLINICAL DATA:  Recent fall with left knee pain, initial encounter EXAM: LEFT KNEE - COMPLETE 4+ VIEW COMPARISON:  None. FINDINGS: No evidence of fracture, dislocation, or joint effusion. No evidence of arthropathy or other focal bone abnormality.  Soft tissues are unremarkable. IMPRESSION: No acute abnormality noted. Electronically Signed   By: Inez Catalina M.D.   On: 05/08/2021 15:32   ECHOCARDIOGRAM COMPLETE  Result Date: 05/09/2021    ECHOCARDIOGRAM REPORT   Patient Name:   Madison Davidson Date of Exam:  05/09/2021 Medical Rec #:  [id number]        Height:       62.0 in Accession #:    [id number]       Weight:       175.5 lb Date of Birth:  1951/04/21       BSA:          1.808 m Patient Age:    1 years         BP:           179/73 mmHg Patient Gender: F                HR:           73 bpm. Exam Location:  [facility] Procedure: 2D Echo, Cardiac Doppler, Color Doppler and [place] Contrast Bubble            Study Indications:     Stroke I63.9  History:         Patient has no prior history of Echocardiogram examinations.                  Stroke; Risk Factors:Diabetes.  Sonographer:     Sherrie Sport RDCS (AE) Referring Phys:  [place] Diagnosing Phys: Ida Rogue MD IMPRESSIONS  1. Left ventricular ejection fraction, by estimation, is 60 to 65%. The left ventricle has normal function. The left ventricle has no regional wall motion abnormalities. Left ventricular diastolic parameters are indeterminate.  2. Right ventricular systolic function is normal. The right ventricular size is normal. FINDINGS  Left Ventricle: Left ventricular ejection fraction, by estimation, is 60 to 65%. The left ventricle has normal function. The left ventricle has no regional wall motion abnormalities. The left ventricular internal cavity size was normal in size. There is  no left ventricular hypertrophy. Left ventricular diastolic parameters are indeterminate. Right Ventricle: The right ventricular size is normal. No increase in right ventricular wall thickness. Right ventricular systolic function is normal. Left Atrium: Left atrial size was normal in size. Right Atrium: Right atrial size was normal in size. Pericardium: There is no evidence of pericardial effusion. Mitral Valve: The mitral valve was not well visualized. No evidence of mitral valve regurgitation. No evidence of mitral valve stenosis. Tricuspid Valve: The tricuspid valve is not well visualized. Tricuspid valve regurgitation is not demonstrated. No evidence of tricuspid  stenosis. Aortic Valve: The aortic valve was not well visualized. Aortic valve regurgitation is not visualized. No aortic stenosis is present. Pulmonic Valve: The pulmonic valve was not well visualized. Pulmonic valve regurgitation is not visualized. No evidence of pulmonic stenosis. Aorta: The aortic root is normal in size and structure. Venous: The inferior vena cava is normal in size with greater than 50% respiratory variability, suggesting right atrial pressure of 3 mmHg. IAS/Shunts: No atrial level shunt detected by color flow Doppler. Agitated saline contrast was given intravenously to evaluate for intracardiac shunting.  LEFT VENTRICLE PLAX 2D LVIDd:         3.70 cm LVIDs:         2.30 cm LV PW:         1.20 cm LV IVS:        1.30 cm  LVOT diam:     2.10 cm LVOT Area:     3.46 cm  LEFT ATRIUM         Index LA diam:    3.20 cm 1.77 cm/m                        PULMONIC VALVE AORTA                 PV Vmax:        0.99 m/s Ao Root diam: 3.20 cm PV Peak grad:   3.9 mmHg                       RVOT Peak grad: 5 mmHg   SHUNTS Systemic Diam: 2.10 cm Ida Rogue MD Electronically signed by Ida Rogue MD Signature Date/Time: 05/09/2021/5:06:01 PM    Final     Assessment: 70 y.o. female with PMH of hypertension, hyperlipidemia, diabetes, obesity initially admitted to [facility] for a fall with brief loss of consciousness and then developed transient left leg numbness and slurred speech.  MRI showed right CR/SO infarcts, as well as chronic left BG and occipital infarcts.  CTA head showed right MCA severe stenosis, left M2 and P2 moderate stenosis.  Carotid Doppler negative.  EF 60 to 65%, A1c 7.1, LDL 119.  She was put on aspirin Plavix and Lipitor and transferred to [facility] for planned right MCA intracranial stenting.  Stroke Risk Factors - diabetes mellitus, hyperlipidemia, hypertension, and CAD and obesity  Plan: - UDS - pending right MCA stenting with neuro IR - Continue aspirin Plavix Lipitor - PT consult, OT  consult, Speech consult - Risk factor modification - Telemetry monitoring - Frequent neuro checks - Permissive hypertension before procedure.  Postprocedure BP goal per neuro IR - will follow  Thank you for this consultation and allowing [place] to participate in the care of this patient.  Rosalin Hawking, MD PhD Stroke Neurology 05/11/2021 5:30 PM

## 2021-05-11 NOTE — Progress Notes (Signed)
PROGRESS NOTE    Patient: Madison Davidson                            PCP: [facility]                    DOB: 31-May-1951            DOA: 05/11/2021 NKN:[id number]             DOS: 05/11/2021, 1:13 PM   LOS: 0 days   Date of Service: The patient was seen and examined on 05/11/2021  Subjective:   The patient was seen and examined this morning. Patient remained stable, n.p.o., pending radiology evaluation for possible MCA stenting No issues overnight  Brief Narrative:   WELDA AZZARELLO is a 70 y.o. female with medical history significant for type 2 diabetes, hypertension, hyperlipidemia, hypothyroidism, obesity, coronary artery disease, prior CVA, who initially presented to [facility] ED on 05/08/21 due to transient slurring of speech.  She was admitted for stroke work-up.  Work-up revealed severe symptomatic right MCA stenosis.  At [facility] she was followed by neurology who recommended transfer to [facility] for urgent but not emergent intervention, possible intracranial stenting of the right MCA origin and bifurcation.  Patient was seen and examined in her room at [facility] 3W unit.  She has no new complaints at the time of this visit.  States she is sleepy.  Her speech appears improved.   ED Course:  Direct transfer from [facility] to [facility].    Assessment & Plan:   Active Problems:   Intracranial atherosclerosis     Intracranial atherosclerosis   Severe symptomatic right MCA stenosis Presented with transient slurring of speech at [facility] -No progression of symptoms, no new focal neurological findings Work-up revealed severe symptomatic right MCA stenosis Seen by neurology at [facility] with recommendation for transfer to [facility] for possible intracranial stenting of the right MCA origin and bifurcation. IR consulted -pending evaluation for possible MCA stenting today -Neurology consulted Dr. Erlinda Hong Per neurology, DAPT aspirin 81 mg daily and Plavix 75 mg daily, continue Lipitor 80 mg daily. Patient  may need a PCSK9 inhibitor outpatient, LDL not at goal. Allow permissive hypertension up to 220/120.   Hyperlipidemia LDL 119 on 05/09/2021 Goal LDL less than 70. Continue high intensity statin Lipitor 80 mg daily. May need a PCSK9 inhibitor as stated above. -Recommending outpatient lipid allergist evaluation  Type 2 diabetes with hyperglycemia Hemoglobin A1c 7.1 on 05/09/2021 Goal A1c less than 7.0 Insulin sliding scale -Currently n.p.o.   Hypothyroidism Resume home levothyroxine   Chronic anxiety/depression Resume home regimen.  Obesity BMI 32 Recommend weight loss outpatient with regular physical activity and healthy dieting.     DVT prophylaxis: SCDs.   Code Status: Full code.   Family Communication: None at bedside.   Disposition Plan: Admitted to telemetry medical.   Consults called: IR, please consult neurology in the morning.   Admission status: Inpatient status.  Patient will require at least 2 midnights for further evaluation and treatment of present condition.     Status is: Inpatient       Dispo:             Patient From: Home            Planned Disposition: Home, possibly on 05/13/2021 or when neurology and IR sign off.           Medically stable  for discharge: No                      ----------------------------------------------------------------------------------------------------------------------------------------------- Nutritional status:  The patient's BMI is: Body mass index is 32.86 kg/m. I agree with the assessment and plan as outlined below: Nutrition Status:    ---------------------------------------------------------------------------------------------------------------------------------------------------- Cultures; none   Antimicrobials: None    Consultants:  Neurology/interventional  radiology   ------------------------------------------------------------------------------------------------------------------------------------------------  Dispo:  Patient From: Home   Level of care: Telemetry Medical   Procedures:     Antimicrobials:  Anti-infectives (From admission, onward)    None        Medication:   [MAR Hold] aspirin EC  81 mg Oral Daily   [MAR Hold] atorvastatin  80 mg Oral QHS   [MAR Hold] citalopram  10 mg Oral Daily   [MAR Hold] clopidogrel  75 mg Oral Daily   [MAR Hold] insulin aspart  0-15 Units Subcutaneous Q4H   [MAR Hold] levothyroxine  125 mcg Oral Q0600   nitroGLYCERIN       [MAR Hold] nortriptyline  10 mg Oral QHS    [MAR Hold] acetaminophen, [MAR Hold] melatonin, [MAR Hold] ondansetron (ZOFRAN) IV   Objective:   Vitals:   05/11/21 0046 05/11/21 1046 05/11/21 1242 05/11/21 1301  BP:  (!) 177/67 (!) 207/61 (!) 197/56  Pulse:  60 64   Resp:   18   Temp:  98.2 F (36.8 C) (!) 96.8 F (36 C)   TempSrc:  Oral Axillary   SpO2:  96% 97%   Weight: 81.5 kg     Height: 5\' 2"  (1.575 m)      No intake or output data in the 24 hours ending 05/11/21 1313 Filed Weights   05/11/21 0046  Weight: 81.5 kg     Examination:   Physical Exam  Constitution:  Alert, cooperative, no distress,  Appears calm and comfortable  Psychiatric: Normal and stable mood and affect, cognition intact,   HEENT: Normocephalic, PERRL, otherwise with in Normal limits  Chest:Chest symmetric Cardio vascular:  S1/S2, RRR, No murmure, No Rubs or Gallops  pulmonary: Clear to auscultation bilaterally, respirations unlabored, negative wheezes / crackles Abdomen: Soft, non-tender, non-distended, bowel sounds,no masses, no organomegaly Muscular skeletal: Limited exam - in bed, able to move all 4 extremities, Normal strength,  Neuro: CNII-XII intact. , normal motor and sensation, reflexes intact  Extremities: No pitting edema lower extremities, +2 pulses  Skin:  Dry, warm to touch, negative for any Rashes, No open wounds Wounds: per nursing documentation    ------------------------------------------------------------------------------------------------------------------------------------------    LABs:  CBC Latest Ref Rng & Units 05/11/2021 05/09/2021 05/08/2021  WBC 4.0 - 10.5 K/uL 9.4 8.2 9.0  Hemoglobin 12.0 - 15.0 g/dL 12.0 12.2 11.7(L)  Hematocrit 36.0 - 46.0 % 38.3 37.6 37.0  Platelets 150 - 400 K/uL 340 320 309   CMP Latest Ref Rng & Units 05/11/2021 05/09/2021 05/08/2021  Glucose 70 - 99 mg/dL 157(H) 262(H) 232(H)  BUN 8 - 23 mg/dL 14 18 23   Creatinine 0.44 - 1.00 mg/dL 0.82 0.75 1.07(H)  Sodium 135 - 145 mmol/L 137 135 137  Potassium 3.5 - 5.1 mmol/L 3.9 4.0 4.0  Chloride 98 - 111 mmol/L 103 103 104  CO2 22 - 32 mmol/L 23 25 22   Calcium 8.9 - 10.3 mg/dL 9.0 8.9 9.2       Micro Results Recent Results (from the past 240 hour(s))  Resp Panel by RT-PCR (Flu A&B, Covid) Nasopharyngeal Swab     Status:  None   Collection Time: 05/08/21  2:19 PM   Specimen: Nasopharyngeal Swab; Nasopharyngeal(NP) swabs in vial transport medium  Result Value Ref Range Status   SARS Coronavirus 2 by RT PCR NEGATIVE NEGATIVE Final    Comment: (NOTE) SARS-CoV-2 target nucleic acids are NOT DETECTED.  The SARS-CoV-2 RNA is generally detectable in upper respiratory specimens during the acute phase of infection. The lowest concentration of SARS-CoV-2 viral copies this assay can detect is 138 copies/mL. A negative result does not preclude SARS-Cov-2 infection and should not be used as the sole basis for treatment or other patient management decisions. A negative result may occur with  improper specimen collection/handling, submission of specimen other than nasopharyngeal swab, presence of viral mutation(s) within the areas targeted by this assay, and inadequate number of viral copies(<138 copies/mL). A negative result must be combined with clinical  observations, patient history, and epidemiological information. The expected result is Negative.  Fact Sheet for Patients:  EntrepreneurPulse.com.au  Fact Sheet for Healthcare Providers:  IncredibleEmployment.be  This test is no t yet approved or cleared by the [place] FDA and  has been authorized for detection and/or diagnosis of SARS-CoV-2 by FDA under an Emergency Use Authorization (EUA). This EUA will remain  in effect (meaning this test can be used) for the duration of the COVID-19 declaration under Section 564(b)(1) of the Act, 21 U.S.C.section 360bbb-3(b)(1), unless the authorization is terminated  or revoked sooner.       Influenza A by PCR NEGATIVE NEGATIVE Final   Influenza B by PCR NEGATIVE NEGATIVE Final    Comment: (NOTE) The Xpert Xpress SARS-CoV-2/FLU/RSV plus assay is intended as an aid in the diagnosis of influenza from Nasopharyngeal swab specimens and should not be used as a sole basis for treatment. Nasal washings and aspirates are unacceptable for Xpert Xpress SARS-CoV-2/FLU/RSV testing.  Fact Sheet for Patients: EntrepreneurPulse.com.au  Fact Sheet for Healthcare Providers: IncredibleEmployment.be  This test is not yet approved or cleared by the [place] FDA and has been authorized for detection and/or diagnosis of SARS-CoV-2 by FDA under an Emergency Use Authorization (EUA). This EUA will remain in effect (meaning this test can be used) for the duration of the COVID-19 declaration under Section 564(b)(1) of the Act, 21 U.S.C. section 360bbb-3(b)(1), unless the authorization is terminated or revoked.  Performed at [facility], [address] [place], [place] [address]     Radiology Reports CT ANGIO HEAD W OR WO CONTRAST  Result Date: 05/09/2021 CLINICAL DATA:  Acute stroke on MRI brain EXAM: CT ANGIOGRAPHY HEAD TECHNIQUE: Multidetector CT imaging of the  head was performed using the standard protocol during bolus administration of intravenous contrast. Multiplanar CT image reconstructions and MIPs were obtained to evaluate the vascular anatomy. CONTRAST:  4mL OMNIPAQUE IOHEXOL 350 MG/ML SOLN COMPARISON:  CT 05/08/2021, correlation made with MRI brain 05/08/2021 FINDINGS: CT HEAD Brain: Evolving recent infarction is again identified within the right corona radiata. Small area of right frontal cortical involvement on the MRI is not visible by CT. There is no acute intracranial hemorrhage. No significant mass effect. Chronic infarct of the left basal ganglia and adjacent white matter with ex vacuo dilatation of the adjacent lateral ventricle. No hydrocephalus. No extra-axial collection. Vascular: There is atherosclerotic calcification at the skull base. Skull: Calvarium is unremarkable. Sinuses/Orbits: No acute finding. Other: None. CTA HEAD Anterior circulation: Intracranial internal carotid arteries are patent with calcified plaque but no significant stenosis. Anterior cerebral arteries are patent. There is eccentric plaque at  the origin of the right MCA with marked stenosis. High-grade stenosis is present at the right MCA bifurcation. Left MCA is patent. There is moderate stenosis of an anterior division M2 branch. Posterior circulation: Dominant intracranial right vertebral artery is patent. Proximal intracranial left vertebral artery is irregular with diminished enhancement. There is improved opacification more distally beginning at the PICA origin, which may reflect retrograde flow. Basilar artery is patent. Major cerebellar artery origins are patent. A left posterior communicating artery is present. There is irregularity of the left P2 PCA with moderate to marked stenosis. Venous sinuses: As permitted by contrast timing, patent. Review of the MIP images confirms the above findings. IMPRESSION: Evolving recent infarction of the right corona radiata. No acute  intracranial hemorrhage. 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 Moderate left M2 MCA branch stenosis. Diffusely irregular left vertebral artery with diminished flow. There is improvement beginning at the PICA origin likely from retrograde flow. Irregular left P2 PCA with moderate to marked stenosis. Electronically Signed   By: Macy Mis M.D.   On: 05/09/2021 14:35   CT Head Wo Contrast  Result Date: 05/08/2021 CLINICAL DATA:  TIA.  Weakness and left knee pain EXAM: CT HEAD WITHOUT CONTRAST TECHNIQUE: Contiguous axial images were obtained from the base of the skull through the vertex without intravenous contrast. COMPARISON:  CT head 09/13/2017 FINDINGS: Brain: Ventricle size and cerebral volume normal for age. Ill-defined hypodensities in the right frontal white matter are new since the prior study. These are compatible with infarct of indeterminate age, possibly subacute. Well-defined hypodensity left basal ganglia and internal capsule anteriorly compatible with chronic infarct. This was not present previously. Negative for hemorrhage or mass. Vascular: Negative for hyperdense vessel Skull: Negative Sinuses/Orbits: Negative Other: None IMPRESSION: Ill-defined hypodensities right frontal white matter compatible with ischemia of indeterminate age. Possible subacute Chronic infarct left basal ganglia No acute hemorrhage. Electronically Signed   By: Franchot Gallo M.D.   On: 05/08/2021 13:39   MR BRAIN WO CONTRAST  Result Date: 05/08/2021 CLINICAL DATA:  Weakness. EXAM: MRI HEAD WITHOUT CONTRAST TECHNIQUE: Multiplanar, multiecho pulse sequences of the brain and surrounding structures were obtained without intravenous contrast. COMPARISON:  Head CT 05/08/2021 FINDINGS: Brain: There are acute to early subacute infarcts involving white matter in the right frontal lobe primarily at the level of the corona radiata, and there is also a single subcentimeter acute or early subacute cortical  infarct laterally in the right frontal lobe. There is a chronic infarct in the left basal ganglia with associated chronic blood products. There is also a small chronic cortical infarct in the posteroinferior left occipital lobe. Mild cerebral atrophy is within normal limits for age. No mass, midline shift, or extra-axial fluid collection is evident. Vascular: Major intracranial vascular flow voids are preserved. Skull and upper cervical spine: Unremarkable bone marrow signal. Sinuses/Orbits: Unremarkable orbits. Paranasal sinuses and mastoid air cells are clear. Other: None. IMPRESSION: 1. Acute to early subacute right frontal lobe infarcts. 2. 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 Electronically Signed   By: Logan Bores M.D.   On: 05/08/2021 16:58   US Carotid Bilateral (at [facility] and AP only)  Result Date: 05/09/2021 CLINICAL DATA:  70 year old female with a history of CVA EXAM: BILATERAL CAROTID DUPLEX ULTRASOUND TECHNIQUE: Pearline Cables scale imaging, color Doppler and duplex ultrasound were performed of bilateral carotid and vertebral arteries in the neck. COMPARISON:  None. FINDINGS: Criteria: Quantification of carotid stenosis is based on velocity parameters that correlate  the residual internal carotid diameter with NASCET-based stenosis levels, using the diameter of the distal internal carotid lumen as the denominator for stenosis measurement. The following velocity measurements were obtained: RIGHT ICA:  Systolic 250 cm/sec, Diastolic 19 cm/sec CCA:  539 cm/sec SYSTOLIC ICA/CCA RATIO:  1.5 ECA:  128 cm/sec LEFT ICA:  Systolic 95 cm/sec, Diastolic 11 cm/sec CCA:  84 cm/sec SYSTOLIC ICA/CCA RATIO:  1.1 ECA:  157 cm/sec Right Brachial SBP: Not acquired Left Brachial SBP: Not acquired RIGHT CAROTID ARTERY: No significant calcified disease of the right common carotid artery. Intermediate waveform maintained. Heterogeneous plaque without significant calcifications at the right carotid bifurcation. Low  resistance waveform of the right ICA. No significant tortuosity. RIGHT VERTEBRAL ARTERY: Antegrade flow with low resistance waveform. LEFT CAROTID ARTERY: No significant calcified disease of the left common carotid artery. Intermediate waveform maintained. Heterogeneous plaque at the left carotid bifurcation without significant calcifications. Low resistance waveform of the left ICA. LEFT VERTEBRAL ARTERY: Left vertebral artery not identified. IMPRESSION: Color duplex indicates moderate heterogeneous plaque with no hemodynamically significant stenosis by duplex criteria in the extracranial cerebrovascular circulation. Left vertebral artery waveform not identified. Signed, Dulcy Fanny. Dellia Nims, RPVI [facility] Radiology Electronically Signed   By: Corrie Mckusick D.O.   On: 05/09/2021 08:19   DG Knee Complete 4 Views Left  Result Date: 05/08/2021 CLINICAL DATA:  Recent fall with left knee pain, initial encounter EXAM: LEFT KNEE - COMPLETE 4+ VIEW COMPARISON:  None. FINDINGS: No evidence of fracture, dislocation, or joint effusion. No evidence of arthropathy or other focal bone abnormality. Soft tissues are unremarkable. IMPRESSION: No acute abnormality noted. Electronically Signed   By: Inez Catalina M.D.   On: 05/08/2021 15:32   ECHOCARDIOGRAM COMPLETE  Result Date: 05/09/2021    ECHOCARDIOGRAM REPORT   Patient Name:   AMARIANNA ABPLANALP Pischke Date of Exam: 05/09/2021 Medical Rec #:  [id number]        Height:       62.0 in Accession #:    [id number]       Weight:       175.5 lb Date of Birth:  Jul 27, 1951       BSA:          1.808 m Patient Age:    33 years         BP:           179/73 mmHg Patient Gender: F                HR:           73 bpm. Exam Location:  [facility] Procedure: 2D Echo, Cardiac Doppler, Color Doppler and Saline Contrast Bubble            Study Indications:     Stroke I63.9  History:         Patient has no prior history of Echocardiogram examinations.                   Stroke; Risk Factors:Diabetes.  Sonographer:     Sherrie Sport RDCS (AE) Referring Phys:  [place] Diagnosing Phys: Ida Rogue MD IMPRESSIONS  1. Left ventricular ejection fraction, by estimation, is 60 to 65%. The left ventricle has normal function. The left ventricle has no regional wall motion abnormalities. Left ventricular diastolic parameters are indeterminate.  2. Right ventricular systolic function is normal. The right ventricular size is normal. FINDINGS  Left Ventricle: Left ventricular ejection fraction, by  estimation, is 60 to 65%. The left ventricle has normal function. The left ventricle has no regional wall motion abnormalities. The left ventricular internal cavity size was normal in size. There is  no left ventricular hypertrophy. Left ventricular diastolic parameters are indeterminate. Right Ventricle: The right ventricular size is normal. No increase in right ventricular wall thickness. Right ventricular systolic function is normal. Left Atrium: Left atrial size was normal in size. Right Atrium: Right atrial size was normal in size. Pericardium: There is no evidence of pericardial effusion. Mitral Valve: The mitral valve was not well visualized. No evidence of mitral valve regurgitation. No evidence of mitral valve stenosis. Tricuspid Valve: The tricuspid valve is not well visualized. Tricuspid valve regurgitation is not demonstrated. No evidence of tricuspid stenosis. Aortic Valve: The aortic valve was not well visualized. Aortic valve regurgitation is not visualized. No aortic stenosis is present. Pulmonic Valve: The pulmonic valve was not well visualized. Pulmonic valve regurgitation is not visualized. No evidence of pulmonic stenosis. Aorta: The aortic root is normal in size and structure. Venous: The inferior vena cava is normal in size with greater than 50% respiratory variability, suggesting right atrial pressure of 3 mmHg. IAS/Shunts: No atrial level shunt detected by  color flow Doppler. Agitated saline contrast was given intravenously to evaluate for intracardiac shunting.  LEFT VENTRICLE PLAX 2D LVIDd:         3.70 cm LVIDs:         2.30 cm LV PW:         1.20 cm LV IVS:        1.30 cm LVOT diam:     2.10 cm LVOT Area:     3.46 cm  LEFT ATRIUM         Index LA diam:    3.20 cm 1.77 cm/m                        PULMONIC VALVE AORTA                 PV Vmax:        0.99 m/s Ao Root diam: 3.20 cm PV Peak grad:   3.9 mmHg                       RVOT Peak grad: 5 mmHg   SHUNTS Systemic Diam: 2.10 cm Ida Rogue MD Electronically signed by Ida Rogue MD Signature Date/Time: 05/09/2021/5:06:01 PM    Final     SIGNED: Deatra James, MD, FHM. [facility],  Pager (please use [email or web address] to page/text) Please use Epic Secure Chat for non-urgent communication (7AM-7PM)  If 7PM-7AM, please contact night-coverage [email or web address], 05/11/2021, 1:13 PM

## 2021-05-11 NOTE — Procedures (Signed)
INTERVENTIONAL NEURORADIOLOGY BRIEF POSTPROCEDURE NOTE  Diagnostic cerebral angiogram and intracranial angioplasty and stenting   Attending: Dr. Pedro Earls  Assistant: None.   Diagnosis: Intracranial atherosclerotic disease   Access site: Distal right radial artery   Access closure: Inflatable band   Anesthesia: General anesthesia.   Medication used: Refer to anesthesia documentation.  Complications: None.   Estimated blood loss: Negligible.   Specimen: None.   Findings: Severe stenosis of the distal right M1/MCA segment involving the origin of the superior division branch with significant delay in distal opacification.  Angioplasty and stenting performed with significant improvement of the anterograde flow.  Multiple other areas of intracranial stenosis will be detailed on for procedure report on PACs.   The patient tolerated the procedure well without incident or complication and is in stable condition.

## 2021-05-11 NOTE — Consult Note (Signed)
Chief Complaint: Patient was seen in consultation today for right MCA stenosis.  Referring Physician(s): Irene Pap, DO  Supervising Physician: Pedro Earls  Patient Status: [facility] - In-pt  History of Present Illness: JOZETTE CASTRELLON is a 70 y.o. female with a past medical history significant for anemia, DM, HLD, CAD, HTN and prior CVA who presented to [facility] ED via EMS on 6/13 with complaints of weakness and slurred speech the day prior which had since resolved, per her daughter she had not been taking her medications as prescribed. She was noted to be hypertensive at 171/60 mmHg. Her neurologic exam was benign however CT head w/o contrast showed ill-defined hypodensities in the right frontal white matter compatible with ischemia of indeterminate age. A follow up MRI brain w/o contrast showed acute to early subacute right frontal lobe infarcts as well as chronic left basal ganglia and left occipital infarcts. CTA head showed evolving recent infarction of the right corona radiata without acute intracranial hemorrhage as well as marked stenoses of the right MCA origin and bifurcation, moderate left M2 MCA branch stenosis, diffusely irregular left vertebral artery with diminished flow and irregular left P2 PCA with moderate to marked stenosis. Bilateral carotid artery US showed moderate heterogenous plaque with no hemodynamically significant stenosis in the extracranial cerebrovascular circulation. She has been transferred to [facility] for planned right MCA angioplasty/stent placement in NIR.  Ms. Panther was seen in her room today, she complains of being hungry and having a headache from being hungry. She is asking for a cup of water. She describes the headache as "dull in the back part of my head" and is certain it is due to hunger. She denies pain elsewhere. She tells me that the left side of her mouth "looks droopy" sometimes and that she has been stuttering more lately which makes it  hard for people to understand what she is saying. She also has trouble remembering things at times such as where she put something or what she was doing. She denies any current weakness. She tells me she is independent at home and is able to do her own cleaning, washing, cooking, etc. She does not use a cane or walker. She understands the procedure and is agreeable to proceed as planned.  Past Medical History:  Diagnosis Date   Cancer ([place])    skin   Coronary artery disease    Diabetes mellitus without complication ([place])    Hypertension    Stroke [facility])     Past Surgical History:  Procedure Laterality Date   ABDOMINAL HYSTERECTOMY      Allergies: Patient has no known allergies.  Medications: Prior to Admission medications   Medication Sig Start Date End Date Taking? Authorizing Provider  aspirin EC 81 MG tablet Take 81 mg by mouth as needed.    [provider]  atorvastatin (LIPITOR) 20 MG tablet Take 4 tablets (80 mg total) by mouth at bedtime. 05/10/21   Geradine Girt, DO  citalopram (CELEXA) 10 MG tablet Take 10 mg by mouth daily.    Marguerita Merles, MD  clopidogrel (PLAVIX) 75 MG tablet Take 1 tablet (75 mg total) by mouth daily. 05/11/21   Geradine Girt, DO  levothyroxine (SYNTHROID) 125 MCG tablet Take 1 tablet (125 mcg total) by mouth daily at 6 (six) AM. 05/11/21   Geradine Girt, DO  nortriptyline (PAMELOR) 10 MG capsule Take 10 mg by mouth at bedtime.     Marguerita Merles, MD  Family History  Problem Relation Age of Onset   Heart disease Mother    Heart disease Father     Social History   Socioeconomic History   Marital status: Divorced    Spouse name: Not on file   Number of children: Not on file   Years of education: Not on file   Highest education level: Not on file  Occupational History   Not on file  Tobacco Use   Smoking status: Never   Smokeless tobacco: Never  Substance and Sexual Activity   Alcohol use: No   Drug use: No   Sexual  activity: Not on file  Other Topics Concern   Not on file  Social History Narrative   Not on file   Social Determinants of Health   Financial Resource Strain: Not on file  Food Insecurity: Not on file  Transportation Needs: Not on file  Physical Activity: Not on file  Stress: Not on file  Social Connections: Not on file     Review of Systems: A 12 point ROS discussed and pertinent positives are indicated in the HPI above.  All other systems are negative.  Review of Systems  Constitutional:  Negative for activity change, appetite change, chills and fever.  HENT:  Negative for tinnitus and trouble swallowing.   Eyes:  Negative for photophobia and visual disturbance.  Respiratory:  Negative for cough and shortness of breath.   Cardiovascular:  Negative for chest pain and leg swelling.  Gastrointestinal:  Negative for abdominal pain, diarrhea, nausea and vomiting.  Genitourinary:  Negative for dysuria, flank pain and hematuria.  Musculoskeletal:  Negative for back pain, gait problem, neck pain and neck stiffness.  Neurological:  Positive for facial asymmetry ("left side droopy"), speech difficulty (stuttering) and headaches. Negative for dizziness, tremors, seizures, syncope, weakness, light-headedness and numbness.  Psychiatric/Behavioral:  Negative for confusion.    Vital Signs: Ht 5\' 2"  (1.575 m)   Wt 179 lb 10.8 oz (81.5 kg)   BMI 32.86 kg/m   Physical Exam Vitals and nursing note reviewed.  Constitutional:      General: She is not in acute distress.    Appearance: She is not ill-appearing.  HENT:     Head: Normocephalic.     Mouth/Throat:     Mouth: Mucous membranes are dry.     Pharynx: Oropharynx is clear. No oropharyngeal exudate or posterior oropharyngeal erythema.     Comments: Edentulous Eyes:     General: No scleral icterus.    Extraocular Movements: Extraocular movements intact.  Cardiovascular:     Rate and Rhythm: Normal rate and regular rhythm.   Pulmonary:     Effort: Pulmonary effort is normal.     Breath sounds: Normal breath sounds.  Abdominal:     General: There is no distension.     Palpations: Abdomen is soft.     Tenderness: There is no abdominal tenderness.  Musculoskeletal:     Cervical back: Normal range of motion.  Skin:    General: Skin is warm and dry.  Neurological:     Mental Status: She is alert.  Alert, awake, and oriented x 4 Speech and comprehension in tact - mild stutter/slurring noted at times. No aphasia.  PERRL bilaterally EOMs without nystagmus or subjective diplopia. Visual fields grossly intact. No obvious facial asymmetry. Tongue midline. Motor power full in all 4 extremities Pronator drift - not assessed. Fine motor and coordination grossly in tact Gait - not assessed Romberg - not assessed Heel  to toe - not assessed Distal pulses palpable bilaterally   MD Evaluation Airway: WNL (edentulous) Heart: WNL Abdomen: WNL Chest/ Lungs: WNL ASA  Classification: Per MD or Designee Mallampati/Airway Score: One   Imaging: CT ANGIO HEAD W OR WO CONTRAST  Result Date: 05/09/2021 CLINICAL DATA:  Acute stroke on MRI brain EXAM: CT ANGIOGRAPHY HEAD TECHNIQUE: Multidetector CT imaging of the head was performed using the standard protocol during bolus administration of intravenous contrast. Multiplanar CT image reconstructions and MIPs were obtained to evaluate the vascular anatomy. CONTRAST:  23mL OMNIPAQUE IOHEXOL 350 MG/ML SOLN COMPARISON:  CT 05/08/2021, correlation made with MRI brain 05/08/2021 FINDINGS: CT HEAD Brain: Evolving recent infarction is again identified within the right corona radiata. Small area of right frontal cortical involvement on the MRI is not visible by CT. There is no acute intracranial hemorrhage. No significant mass effect. Chronic infarct of the left basal ganglia and adjacent white matter with ex vacuo dilatation of the adjacent lateral ventricle. No hydrocephalus. No  extra-axial collection. Vascular: There is atherosclerotic calcification at the skull base. Skull: Calvarium is unremarkable. Sinuses/Orbits: No acute finding. Other: None. CTA HEAD Anterior circulation: Intracranial internal carotid arteries are patent with calcified plaque but no significant stenosis. Anterior cerebral arteries are patent. There is eccentric plaque at the origin of the right MCA with marked stenosis. High-grade stenosis is present at the right MCA bifurcation. Left MCA is patent. There is moderate stenosis of an anterior division M2 branch. Posterior circulation: Dominant intracranial right vertebral artery is patent. Proximal intracranial left vertebral artery is irregular with diminished enhancement. There is improved opacification more distally beginning at the PICA origin, which may reflect retrograde flow. Basilar artery is patent. Major cerebellar artery origins are patent. A left posterior communicating artery is present. There is irregularity of the left P2 PCA with moderate to marked stenosis. Venous sinuses: As permitted by contrast timing, patent. Review of the MIP images confirms the above findings. IMPRESSION: Evolving recent infarction of the right corona radiata. No acute intracranial hemorrhage. 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 Moderate left M2 MCA branch stenosis. Diffusely irregular left vertebral artery with diminished flow. There is improvement beginning at the PICA origin likely from retrograde flow. Irregular left P2 PCA with moderate to marked stenosis. Electronically Signed   By: Macy Mis M.D.   On: 05/09/2021 14:35   CT Head Wo Contrast  Result Date: 05/08/2021 CLINICAL DATA:  TIA.  Weakness and left knee pain EXAM: CT HEAD WITHOUT CONTRAST TECHNIQUE: Contiguous axial images were obtained from the base of the skull through the vertex without intravenous contrast. COMPARISON:  CT head 09/13/2017 FINDINGS: Brain: Ventricle size and cerebral  volume normal for age. Ill-defined hypodensities in the right frontal white matter are new since the prior study. These are compatible with infarct of indeterminate age, possibly subacute. Well-defined hypodensity left basal ganglia and internal capsule anteriorly compatible with chronic infarct. This was not present previously. Negative for hemorrhage or mass. Vascular: Negative for hyperdense vessel Skull: Negative Sinuses/Orbits: Negative Other: None IMPRESSION: Ill-defined hypodensities right frontal white matter compatible with ischemia of indeterminate age. Possible subacute Chronic infarct left basal ganglia No acute hemorrhage. Electronically Signed   By: Franchot Gallo M.D.   On: 05/08/2021 13:39   MR BRAIN WO CONTRAST  Result Date: 05/08/2021 CLINICAL DATA:  Weakness. EXAM: MRI HEAD WITHOUT CONTRAST TECHNIQUE: Multiplanar, multiecho pulse sequences of the brain and surrounding structures were obtained without intravenous contrast. COMPARISON:  Head CT 05/08/2021  FINDINGS: Brain: There are acute to early subacute infarcts involving white matter in the right frontal lobe primarily at the level of the corona radiata, and there is also a single subcentimeter acute or early subacute cortical infarct laterally in the right frontal lobe. There is a chronic infarct in the left basal ganglia with associated chronic blood products. There is also a small chronic cortical infarct in the posteroinferior left occipital lobe. Mild cerebral atrophy is within normal limits for age. No mass, midline shift, or extra-axial fluid collection is evident. Vascular: Major intracranial vascular flow voids are preserved. Skull and upper cervical spine: Unremarkable bone marrow signal. Sinuses/Orbits: Unremarkable orbits. Paranasal sinuses and mastoid air cells are clear. Other: None. IMPRESSION: 1. Acute to early subacute right frontal lobe infarcts. 2. 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 Electronically  Signed   By: Logan Bores M.D.   On: 05/08/2021 16:58   US Carotid Bilateral (at [facility] and [facility] only)  Result Date: 05/09/2021 CLINICAL DATA:  70 year old female with a history of CVA EXAM: BILATERAL CAROTID DUPLEX ULTRASOUND TECHNIQUE: Pearline Cables scale imaging, color Doppler and duplex ultrasound were performed of bilateral carotid and vertebral arteries in the neck. COMPARISON:  None. FINDINGS: Criteria: Quantification of carotid stenosis is based on velocity parameters that correlate the residual internal carotid diameter with NASCET-based stenosis levels, using the diameter of the distal internal carotid lumen as the denominator for stenosis measurement. The following velocity measurements were obtained: RIGHT ICA:  Systolic 322 cm/sec, Diastolic 19 cm/sec CCA:  025 cm/sec SYSTOLIC ICA/CCA RATIO:  1.5 ECA:  128 cm/sec LEFT ICA:  Systolic 95 cm/sec, Diastolic 11 cm/sec CCA:  84 cm/sec SYSTOLIC ICA/CCA RATIO:  1.1 ECA:  157 cm/sec Right Brachial SBP: Not acquired Left Brachial SBP: Not acquired RIGHT CAROTID ARTERY: No significant calcified disease of the right common carotid artery. Intermediate waveform maintained. Heterogeneous plaque without significant calcifications at the right carotid bifurcation. Low resistance waveform of the right ICA. No significant tortuosity. RIGHT VERTEBRAL ARTERY: Antegrade flow with low resistance waveform. LEFT CAROTID ARTERY: No significant calcified disease of the left common carotid artery. Intermediate waveform maintained. Heterogeneous plaque at the left carotid bifurcation without significant calcifications. Low resistance waveform of the left ICA. LEFT VERTEBRAL ARTERY: Left vertebral artery not identified. IMPRESSION: Color duplex indicates moderate heterogeneous plaque with no hemodynamically significant stenosis by duplex criteria in the extracranial cerebrovascular circulation. Left vertebral artery waveform not identified. Signed, Dulcy Fanny. Dellia Nims, RPVI [facility] Electronically Signed   By: Corrie Mckusick D.O.   On: 05/09/2021 08:19   DG Knee Complete 4 Views Left  Result Date: 05/08/2021 CLINICAL DATA:  Recent fall with left knee pain, initial encounter EXAM: LEFT KNEE - COMPLETE 4+ VIEW COMPARISON:  None. FINDINGS: No evidence of fracture, dislocation, or joint effusion. No evidence of arthropathy or other focal bone abnormality. Soft tissues are unremarkable. IMPRESSION: No acute abnormality noted. Electronically Signed   By: Inez Catalina M.D.   On: 05/08/2021 15:32   ECHOCARDIOGRAM COMPLETE  Result Date: 05/09/2021    ECHOCARDIOGRAM REPORT   Patient Name:   JACK MINEAU Badger Date of Exam: 05/09/2021 Medical Rec #:  [id number]        Height:       62.0 in Accession #:    [id number]       Weight:       175.5 lb Date of Birth:  [birth year]       BSA:  1.808 m Patient Age:    56 years         BP:           179/73 mmHg Patient Gender: F                HR:           73 bpm. Exam Location:  [facility] Procedure: 2D Echo, Cardiac Doppler, Color Doppler and [place] Contrast Bubble            Study Indications:     Stroke I63.9  History:         Patient has no prior history of Echocardiogram examinations.                  Stroke; Risk Factors:Diabetes.  Sonographer:     Sherrie Sport RDCS (AE) Referring Phys:  [place] Diagnosing Phys: Ida Rogue MD IMPRESSIONS  1. Left ventricular ejection fraction, by estimation, is 60 to 65%. The left ventricle has normal function. The left ventricle has no regional wall motion abnormalities. Left ventricular diastolic parameters are indeterminate.  2. Right ventricular systolic function is normal. The right ventricular size is normal. FINDINGS  Left Ventricle: Left ventricular ejection fraction, by estimation, is 60 to 65%. The left ventricle has normal function. The left ventricle has no regional wall motion abnormalities. The left ventricular internal cavity size was  normal in size. There is  no left ventricular hypertrophy. Left ventricular diastolic parameters are indeterminate. Right Ventricle: The right ventricular size is normal. No increase in right ventricular wall thickness. Right ventricular systolic function is normal. Left Atrium: Left atrial size was normal in size. Right Atrium: Right atrial size was normal in size. Pericardium: There is no evidence of pericardial effusion. Mitral Valve: The mitral valve was not well visualized. No evidence of mitral valve regurgitation. No evidence of mitral valve stenosis. Tricuspid Valve: The tricuspid valve is not well visualized. Tricuspid valve regurgitation is not demonstrated. No evidence of tricuspid stenosis. Aortic Valve: The aortic valve was not well visualized. Aortic valve regurgitation is not visualized. No aortic stenosis is present. Pulmonic Valve: The pulmonic valve was not well visualized. Pulmonic valve regurgitation is not visualized. No evidence of pulmonic stenosis. Aorta: The aortic root is normal in size and structure. Venous: The inferior vena cava is normal in size with greater than 50% respiratory variability, suggesting right atrial pressure of 3 mmHg. IAS/Shunts: No atrial level shunt detected by color flow Doppler. Agitated saline contrast was given intravenously to evaluate for intracardiac shunting.  LEFT VENTRICLE PLAX 2D LVIDd:         3.70 cm LVIDs:         2.30 cm LV PW:         1.20 cm LV IVS:        1.30 cm LVOT diam:     2.10 cm LVOT Area:     3.46 cm  LEFT ATRIUM         Index LA diam:    3.20 cm 1.77 cm/m                        PULMONIC VALVE AORTA                 PV Vmax:        0.99 m/s Ao Root diam: 3.20 cm PV Peak grad:   3.9 mmHg  RVOT Peak grad: 5 mmHg   SHUNTS Systemic Diam: 2.10 cm Ida Rogue MD Electronically signed by Ida Rogue MD Signature Date/Time: 05/09/2021/5:06:01 PM    Final     Labs:  CBC: Recent Labs    05/08/21 1258 05/09/21 1306  05/11/21 0206  WBC 9.0 8.2 9.4  HGB 11.7* 12.2 12.0  HCT 37.0 37.6 38.3  PLT 309 320 340    COAGS: No results for input(s): INR, APTT in the last 8760 hours.  BMP: Recent Labs    05/08/21 1258 05/09/21 1306 05/11/21 0206  NA 137 135 137  K 4.0 4.0 3.9  CL 104 103 103  CO2 22 25 23   GLUCOSE 232* 262* 157*  BUN 23 18 14   CALCIUM 9.2 8.9 9.0  CREATININE 1.07* 0.75 0.82  GFRNONAA 56* >60 >60    LIVER FUNCTION TESTS: Recent Labs    05/09/21 1306  ALBUMIN 3.6    TUMOR MARKERS: No results for input(s): AFPTM, CEA, CA199, CHROMGRNA in the last 8760 hours.  Assessment and Plan:  70 y/o F who presented to [facility] ED on 6/13 with complaints of weakness and slurred speech the day prior. Imaging showed acute to early subacute right frontal lobe infarcts as well as marked stenoses of the right MCA origin and bifurcation. She was transferred to [facility] for possible NIR intervention - patient history and imaging have been reviewed by Dr Tennis Must Sindy Messing who approves patient for cerebral angiogram with possible angioplasty/stent placement of symptomatic right MCA under general anesthesia which is planned for today in NIR pending any emergent procedures.  Patient to remain NPO until post procedure, continue Plavix 75 mg + ASA 81 mg. Afebrile, BP 177/67 -- currently on permissive HTN parameters up to SBP 180 per neurology. WBC 9.4, hgb 12.0, plt 340, creatinine 0.82.   Risks and benefits of cerebral arteriogram with intervention were discussed with the patient including, but not limited to bleeding, infection, vascular injury, contrast induced renal failure, stroke, reperfusion hemorrhage, or even death.  This interventional procedure involves the use of X-rays and because of the nature of the planned procedure, it is possible that we will have prolonged use of X-ray fluoroscopy. Potential radiation risks to you include (but are not limited to) the following: - A slightly elevated risk for  cancer  several years later in life. This risk is typically less than 0.5% percent. This risk is low in comparison to the normal incidence of human cancer, which is 33% for women and 50% for men according to the [employer]. - Radiation induced injury can include skin redness, resembling a rash, tissue breakdown / ulcers and hair loss (which can be temporary or permanent).   The likelihood of either of these occurring depends on the difficulty of the procedure and whether you are sensitive to radiation due to previous procedures, disease, or genetic conditions.  IF your procedure requires a prolonged use of radiation, you will be notified and given written instructions for further action.  It is your responsibility to monitor the irradiated area for the 2 weeks following the procedure and to notify your physician if you are concerned that you have suffered a radiation induced injury.    All of the patient's questions were answered, patient is agreeable to proceed.  Consent signed and in chart.  Thank you for this interesting consult.  I greatly enjoyed meeting Doyce A Sanz and look forward to participating in their care.  A copy of this report was sent  to the requesting provider on this date.  Electronically Signed: Joaquim Nam, PA-C 05/11/2021, 10:09 AM   I spent a total of 40 Minutes in face to face in clinical consultation, greater than 50% of which was counseling/coordinating care for cerebral angiogram with angioplasty/stenting of right MCA.

## 2021-05-11 NOTE — Plan of Care (Signed)

## 2021-05-11 NOTE — Plan of Care (Signed)
  Problem: Education: Goal: Knowledge of General Education information will improve Description: Including pain rating scale, medication(s)/side effects and non-pharmacologic comfort measures 05/11/2021 0056 by Duard Brady, RN Outcome: Progressing 05/11/2021 0054 by Duard Brady, RN Outcome: Progressing   Problem: Clinical Measurements: Goal: Ability to maintain clinical measurements within normal limits will improve 05/11/2021 0056 by Duard Brady, RN Outcome: Progressing 05/11/2021 0054 by Duard Brady, RN Outcome: Progressing Goal: Will remain free from infection 05/11/2021 0056 by Duard Brady, RN Outcome: Progressing 05/11/2021 0054 by Duard Brady, RN Outcome: Progressing Goal: Diagnostic test results will improve 05/11/2021 0056 by Duard Brady, RN Outcome: Progressing 05/11/2021 0054 by Duard Brady, RN Outcome: Progressing Goal: Respiratory complications will improve 05/11/2021 0056 by Duard Brady, RN Outcome: Progressing 05/11/2021 0054 by Duard Brady, RN Outcome: Progressing Goal: Cardiovascular complication will be avoided 05/11/2021 0056 by Duard Brady, RN Outcome: Progressing 05/11/2021 0054 by Duard Brady, RN Outcome: Progressing

## 2021-05-11 NOTE — Anesthesia Preprocedure Evaluation (Addendum)
Anesthesia Evaluation  Patient identified by MRN, date of birth, ID band Patient awake    Reviewed: Allergy & Precautions, NPO status , Patient's Chart, lab work & pertinent test results  Airway Mallampati: II  TM Distance: >3 FB Neck ROM: Full    Dental  (+) Edentulous Upper, Edentulous Lower   Pulmonary neg pulmonary ROS,    Pulmonary exam normal breath sounds clear to auscultation       Cardiovascular hypertension (no home meds,SBPs 170s ), Pt. on medications + CAD  Normal cardiovascular exam Rhythm:Regular Rate:Normal     Neuro/Psych CVA (r mca stroke, hx prior cva-on plavix), Residual Symptoms negative psych ROS   GI/Hepatic negative GI ROS, Neg liver ROS,   Endo/Other  diabetes, Well Controlled, Type 2, Oral Hypoglycemic Agentsa1c 7.1  Renal/GU negative Renal ROS  negative genitourinary   Musculoskeletal negative musculoskeletal ROS (+)   Abdominal (+) + obese,   Peds  Hematology negative hematology ROS (+) hct 38.3   Anesthesia Other Findings   Reproductive/Obstetrics negative OB ROS                           Anesthesia Physical Anesthesia Plan  ASA: 3  Anesthesia Plan: General   Post-op Pain Management:    Induction: Intravenous and Rapid sequence  PONV Risk Score and Plan: 3 and Ondansetron, Dexamethasone and Treatment may vary due to age or medical condition  Airway Management Planned: Oral ETT  Additional Equipment: Arterial line  Intra-op Plan:   Post-operative Plan: Extubation in OR  Informed Consent: I have reviewed the patients History and Physical, chart, labs and discussed the procedure including the risks, benefits and alternatives for the proposed anesthesia with the patient or authorized representative who has indicated his/her understanding and acceptance.     Dental advisory given and Consent reviewed with POA  Plan Discussed with: CRNA  Anesthesia  Plan Comments: (D/w daughter [name] [place], as pt does seem somewhat confused about her own medical history. Per daughter, pt has likely not been taking her medications as prescribed. Very hypertensive on EMS arrival, and still very hypertensive in preop.)      Anesthesia Quick Evaluation

## 2021-05-11 NOTE — Anesthesia Postprocedure Evaluation (Signed)
Anesthesia Post Note  Patient: Madison Davidson  Procedure(s) Performed: IR WITH ANESTHESIA - INTRACRANIAL STENT     Patient location during evaluation: PACU Anesthesia Type: General Level of consciousness: awake and alert, oriented and patient cooperative Pain management: pain level controlled Vital Signs Assessment: post-procedure vital signs reviewed and stable Respiratory status: spontaneous breathing, nonlabored ventilation and respiratory function stable Cardiovascular status: blood pressure returned to baseline and stable Postop Assessment: no apparent nausea or vomiting Anesthetic complications: no   No notable events documented.  Last Vitals:  Vitals:   05/11/21 1643 05/11/21 1658  BP: (!) 133/55 (!) 109/58  Pulse:    Resp: 18 15  Temp:    SpO2: 100% 100%    Last Pain:  Vitals:   05/11/21 1643  TempSrc:   PainSc: 0-No pain                 Pervis Hocking

## 2021-05-12 ENCOUNTER — Encounter (HOSPITAL_COMMUNITY): Payer: Self-pay | Admitting: Neuroradiology

## 2021-05-12 DIAGNOSIS — I6330 Cerebral infarction due to thrombosis of unspecified cerebral artery: Secondary | ICD-10-CM

## 2021-05-12 DIAGNOSIS — R4189 Other symptoms and signs involving cognitive functions and awareness: Secondary | ICD-10-CM

## 2021-05-12 HISTORY — PX: IR INTRA CRAN STENT: IMG2345

## 2021-05-12 LAB — BASIC METABOLIC PANEL
Anion gap: 10 (ref 5–15)
BUN: 14 mg/dL (ref 8–23)
CO2: 24 mmol/L (ref 22–32)
Calcium: 8.8 mg/dL — ABNORMAL LOW (ref 8.9–10.3)
Chloride: 102 mmol/L (ref 98–111)
Creatinine, Ser: 0.84 mg/dL (ref 0.44–1.00)
GFR, Estimated: >60 mL/min (ref >60)
Glucose, Bld: 144 mg/dL — ABNORMAL HIGH (ref 70–99)
Potassium: 4.1 mmol/L (ref 3.5–5.1)
Sodium: 136 mmol/L (ref 135–145)

## 2021-05-12 LAB — GLUCOSE, CAPILLARY
Glucose-Capillary: 142 mg/dL — ABNORMAL HIGH (ref 70–99)
Glucose-Capillary: 151 mg/dL — ABNORMAL HIGH (ref 70–99)
Glucose-Capillary: 178 mg/dL — ABNORMAL HIGH (ref 70–99)
Glucose-Capillary: 232 mg/dL — ABNORMAL HIGH (ref 70–99)
Glucose-Capillary: 214 mg/dL — ABNORMAL HIGH (ref 70–99)

## 2021-05-12 LAB — RAPID HIV SCREEN (HIV 1/2 AB+AG)
HIV 1/2 Antibodies: NONREACTIVE
HIV-1 P24 Antigen - HIV24: NONREACTIVE

## 2021-05-12 LAB — CBC
HCT: 31.9 % — ABNORMAL LOW (ref 36.0–46.0)
Hemoglobin: 10.1 g/dL — ABNORMAL LOW (ref 12.0–15.0)
MCH: 25.4 pg — ABNORMAL LOW (ref 26.0–34.0)
MCHC: 31.7 g/dL (ref 30.0–36.0)
MCV: 80.4 fL (ref 80.0–100.0)
Platelets: 305 10*3/uL (ref 150–400)
RBC: 3.97 MIL/uL (ref 3.87–5.11)
RDW: 14 % (ref 11.5–15.5)
WBC: 8.8 10*3/uL (ref 4.0–10.5)
nRBC: 0 % (ref 0.0–0.2)

## 2021-05-12 LAB — FOLATE: Folate: 13.2 ng/mL (ref >5.9)

## 2021-05-12 LAB — VITAMIN B12: Vitamin B-12: 183 pg/mL (ref 180–914)

## 2021-05-12 LAB — HEPARIN LEVEL (UNFRACTIONATED): Heparin Unfractionated: <0.1 IU/mL — ABNORMAL LOW (ref 0.30–0.70)

## 2021-05-12 LAB — TSH: TSH: 0.022 u[IU]/mL — ABNORMAL LOW (ref 0.350–4.500)

## 2021-05-12 MED ORDER — HEPARIN (PORCINE) 25000 UT/250ML-% IV SOLN
650.0000 [IU]/h | INTRAVENOUS | Status: AC
  Filled 2021-05-12: qty 250

## 2021-05-12 MED ORDER — INSULIN ASPART 100 UNIT/ML IJ SOLN
0.0000 [IU] | Freq: Every day | INTRAMUSCULAR | Status: DC
  Administered 2021-05-12: 2 [IU] via SUBCUTANEOUS
  Administered 2021-05-13: 3 [IU] via SUBCUTANEOUS

## 2021-05-12 MED ORDER — INSULIN ASPART 100 UNIT/ML IJ SOLN
0.0000 [IU] | Freq: Three times a day (TID) | INTRAMUSCULAR | Status: DC
  Administered 2021-05-13 – 2021-05-14 (×4): 3 [IU] via SUBCUTANEOUS
  Administered 2021-05-14: 5 [IU] via SUBCUTANEOUS
  Administered 2021-05-14: 8 [IU] via SUBCUTANEOUS
  Administered 2021-05-15 (×2): 3 [IU] via SUBCUTANEOUS

## 2021-05-12 NOTE — Evaluation (Signed)
Clinical/Bedside Swallow Evaluation Patient Details  Name: Madison Davidson MRN: [id number] Date of Birth: [birth year]  Today's Date: 05/12/2021 Time: SLP Start Time (ACUTE ONLY): 66 SLP Stop Time (ACUTE ONLY): 1300 SLP Time Calculation (min) (ACUTE ONLY): 30 min  Past Medical History:  Past Medical History:  Diagnosis Date   Cancer ([place])    skin   Coronary artery disease    Diabetes mellitus without complication ([place])    Hypertension    Stroke [facility])    Past Surgical History:  Past Surgical History:  Procedure Laterality Date   ABDOMINAL HYSTERECTOMY     RADIOLOGY WITH ANESTHESIA N/A 05/11/2021   Procedure: IR WITH ANESTHESIA - INTRACRANIAL STENT;  Surgeon: Pedro Earls, MD;  Location: [facility];  Service: Radiology;  Laterality: N/A;   HPI:  Pt is a 70 y.o. female admitted to [facility] for a fall with brief loss of consciousness and then developed transient left leg numbness and slurred speech. Transferred to [facility] for planned right MCA intracranial stenting. 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 PMH of DM, HTN, skin cancer, CAD, CVA.   Assessment / Plan / Recommendation Clinical Impression  Pt presents with a moderate oral dysphagia due to right CNVII motor and sensory impairment. This is impacted by severe cognitive impairment (see next note) impacting awareness of pocketed food and initiative to appropriately clear it out. On assessment pt found to have 5 peaches in her right buccal cavity. When discovered, pt spit out one, but not all and needed a verbal cue to remove each. She claimed she wanted to "keep them for later." She is edentulous and reports she has dentures but doesnt use them because they dont fit. When observed eating, pt can slowly masticate crunchy foods and uses a liquid or pudding bolus to clear. She is capable of eating soft solids,  but is not reliable in following appropriate strategies. Will downgrade pts diet to dys 2 (fine chop) with sign posted for staff to remind pt to clear mouth, or to check her cheek. SLP Visit Diagnosis: Dysphagia, oral phase (R13.11)    Aspiration Risk  Mild aspiration risk    Diet Recommendation Dysphagia 2 (Fine chop);Thin liquid   Liquid Administration via: Cup;Straw    Other  Recommendations     Follow up Recommendations Inpatient Rehab      Frequency and Duration min 2x/week          Prognosis Prognosis for Safe Diet Advancement: Good Barriers to Reach Goals: Cognitive deficits      Swallow Study   General HPI: Pt is a 70 y.o. female admitted to [facility] for a fall with brief loss of consciousness and then developed transient left leg numbness and slurred speech. Transferred to [facility] for planned right MCA intracranial stenting. 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 PMH of DM, HTN, skin cancer, CAD, CVA. Type of Study: Bedside Swallow Evaluation Previous Swallow Assessment: none Diet Prior to this Study: Dysphagia 3 (soft);Thin liquids Respiratory Status: Room air History of Recent Intubation: No Behavior/Cognition: Alert;Cooperative;Requires cueing;Distractible Oral Cavity Assessment: Other (comment) (pocketed food) Oral Care Completed by SLP: Yes Oral Cavity - Dentition: Edentulous;Dentures, not available (doesnt wear dentures though) Vision: Functional for self-feeding Self-Feeding Abilities: Able to feed self Patient Positioning: Upright in chair Baseline Vocal Quality: Low  vocal intensity Volitional Cough: Strong Volitional Swallow: Able to elicit    Oral/Motor/Sensory Function Overall Oral Motor/Sensory Function: Moderate impairment Facial ROM: Suspected CN VII (facial) dysfunction Facial Symmetry: Abnormal symmetry left;Suspected CN VII  (facial) dysfunction Facial Strength: Reduced left;Suspected CN VII (facial) dysfunction Facial Sensation: Within Functional Limits Lingual ROM: Within Functional Limits Lingual Symmetry: Within Functional Limits Lingual Strength: Within Functional Limits Lingual Sensation: Suspected CN VII (facial) dysfunction-anterior 2/3 tongue;Reduced   Ice Chips Ice chips: Not tested   Thin Liquid Thin Liquid: Within functional limits    Nectar Thick Nectar Thick Liquid: Not tested   Honey Thick Honey Thick Liquid: Not tested   Puree Puree: Within functional limits Presentation: Spoon;Self Fed   Solid     Solid: Impaired Presentation: Self Fed Oral Phase Impairments: Impaired mastication Oral Phase Functional Implications: Right lateral sulci pocketing;Prolonged oral transit      Abrar Koone, Katherene Ponto 05/12/2021,1:51 PM

## 2021-05-12 NOTE — Evaluation (Signed)
Speech Language Pathology Evaluation Patient Details Name: Madison Davidson MRN: [id number] DOB: 1951/05/13 Today's Date: 05/12/2021 Time: 1230-1300 SLP Time Calculation (min) (ACUTE ONLY): 30 min  Problem List:  Patient Active Problem List   Diagnosis Date Noted   Intracranial atherosclerosis 05/11/2021   Cerebral thrombosis with cerebral infarction 05/11/2021   Slurred speech 05/08/2021   Hyperlipidemia, mixed 05/08/2021   DM II (diabetes mellitus, type II), controlled ([place]) 05/08/2021   AKI (acute kidney injury) ([place]) 05/08/2021   Anemia 05/08/2021   CVA (cerebral vascular accident) ([place]) 05/08/2021   Benign essential hypertension 08/09/2015   Past Medical History:  Past Medical History:  Diagnosis Date   Cancer ([place])    skin   Coronary artery disease    Diabetes mellitus without complication ([place])    Hypertension    Stroke [facility])    Past Surgical History:  Past Surgical History:  Procedure Laterality Date   ABDOMINAL HYSTERECTOMY     RADIOLOGY WITH ANESTHESIA N/A 05/11/2021   Procedure: IR WITH ANESTHESIA - INTRACRANIAL STENT;  Surgeon: Pedro Earls, MD;  Location: [facility];  Service: Radiology;  Laterality: N/A;   HPI:  Pt is a 70 y.o. female admitted to [facility] for a fall with brief loss of consciousness and then developed transient left leg numbness and slurred speech. Transferred to [facility] for planned right MCA intracranial stenting. 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 PMH of DM, HTN, skin cancer, CAD, CVA.   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 Pt is neglectful of left visual field, can give correct responses to basic safety questions, but does not follow any safety precautions and is a fall risk. Pt has no awareness of  memory, problem solving, sequencing, reasoning errors. Pt has a relative paucity of speech and does not always initaite verbal communication appropriately, which may not be new.  Affect is flat. She does have moderate dysarthria and both reports that her speech is normal and that it is different.    SLP Assessment  SLP Recommendation/Assessment: Patient needs continued Speech Lanaguage Pathology Services SLP Visit Diagnosis: Frontal lobe and executive function deficit;Attention and concentration deficit Attention and concentration deficit following: Cerebral infarction Frontal lobe and executive function deficit following: Cerebral infarction    Follow Up Recommendations  Inpatient Rehab    Frequency and Duration min 2x/week  2 weeks      SLP Evaluation Cognition  Overall Cognitive Status: Impaired/Different from baseline Arousal/Alertness: Awake/alert Orientation Level: Oriented to person;Oriented to place;Disoriented to time;Disoriented to situation Attention: Focused;Sustained Focused Attention: Appears intact Sustained Attention: Impaired Sustained Attention Impairment: Verbal basic;Functional basic Memory: Impaired Memory Impairment: Storage deficit Awareness: Impaired Awareness Impairment: Intellectual impairment;Emergent impairment;Anticipatory impairment Problem Solving: Impaired Problem Solving Impairment: Verbal basic;Functional basic Executive Function: Reasoning;Sequencing;Self Monitoring;Self Correcting;Initiating;Decision Making Reasoning: Impaired Sequencing: Impaired Decision Making: Impaired Initiating: Impaired Self Monitoring: Impaired Self Correcting: Impaired       Comprehension  Auditory Comprehension Overall Auditory Comprehension: Appears within functional limits for tasks assessed    Expression Verbal Expression Overall Verbal Expression: Impaired Initiation: Impaired Automatic Speech: Name;Social Response Level of Generative/Spontaneous  Verbalization: Phrase Repetition: No impairment Naming: No impairment   Oral / Motor  Oral Motor/Sensory Function Overall Oral Motor/Sensory Function: Moderate impairment Facial ROM: Suspected CN VII (facial) dysfunction Facial Symmetry: Abnormal symmetry left;Suspected CN VII (facial) dysfunction Facial Strength: Reduced  left;Suspected CN VII (facial) dysfunction Facial Sensation: Within Functional Limits Lingual ROM: Within Functional Limits Lingual Symmetry: Within Functional Limits Lingual Strength: Within Functional Limits Lingual Sensation: Suspected CN VII (facial) dysfunction-anterior 2/3 tongue;Reduced Motor Speech Overall Motor Speech: Impaired Respiration: Within functional limits Articulation: Impaired Level of Impairment: Phrase Intelligibility: Intelligible Motor Planning: Witnin functional limits Motor Speech Errors: Aware;Consistent   GO                    Jady Braggs, Katherene Ponto 05/12/2021, 2:03 PM

## 2021-05-12 NOTE — Progress Notes (Signed)
[facility] for Heparin Indication:  Post interventional neuroradiology procedure  No Known Allergies  Patient Measurements: Height: 5\' 2"  (157.5 cm) Weight: 81.5 kg (179 lb 10.8 oz) IBW/kg (Calculated) : 50.1 Heparin Dosing Weight: 68.6 kg  Vital Signs: Temp: 98.3 F (36.8 C) (06/16 2349) Temp Source: Oral (06/16 2349) BP: 128/44 (06/17 0008) Pulse Rate: 61 (06/17 0008)  Labs: Recent Labs    05/09/21 1306 05/11/21 0206 05/11/21 2348  HGB 12.2 12.0  --   HCT 37.6 38.3  --   PLT 320 340  --   HEPARINUNFRC  --   --  <0.10*  CREATININE 0.75 0.82  --      Estimated Creatinine Clearance: 62.3 mL/min (by C-G formula based on SCr of 0.82 mg/dL).  Assessment: 70 years old female s/p right MCA intracranial stent for heparin    Goal of Therapy:  Heparin level 0.1 to 0.25 units/ml Monitor platelets by anticoagulation protocol: Yes   Plan:  Increase Heparin 650 units/hr  Phillis Knack, PharmD, BCPS  05/12/2021,12:53 AM

## 2021-05-12 NOTE — Evaluation (Signed)
Physical Therapy Evaluation Patient Details Name: Madison Davidson MRN: [id number] DOB: Jun 02, 1951 Today's Date: 05/12/2021   History of Present Illness  Pt is a 70 y.o. female admitted 6/13 to [facility] for a fall with brief loss of consciousness and then developed transient left leg numbness and slurred speech. Transferred to [facility] for planned right MCA intracranial stenting. 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 PMH of DM, HTN, skin cancer, CAD, CVA.   Clinical Impression  Pt presents with condition above and deficits mentioned below, see PT Problem List. PTA, she was living alone in a 1-level house with several stairs to enter. Pt was independent PTA. Currently, pt is requiring up to [facility] for bed mobility, minA for transfers, and min-modA for bedroom distance ambulation utilizing a RW. She displays deficits in cognition as she can be impulsive with poor insight into her deficits and safety and she is very slow in processing simple cues. Pt also demonstrates bil lower extremity weakness (L weaker than R), L lower extremity dysdiadochokinesia, balance deficits, intermittent L inattention, and decreased activity tolerance that place her at high risk for falls and injuries. Pt would greatly benefit from intensive therapy in the CIR setting to maximize her independence and safety with all functional mobility as she is currently functioning at a significantly lower level than her PLOF and pt is very motivated to improve. Will continue to follow acutely.    Follow Up Recommendations CIR;Supervision/Assistance - 24 hour    Equipment Recommendations  Rolling walker with 5" wheels;3in1 (PT)    Recommendations for Other Services Rehab consult     Precautions / Restrictions Precautions Precautions: Fall Precaution Comments: pocketing food L chk Restrictions Weight Bearing  Restrictions: No      Mobility  Bed Mobility Overal bed mobility: Needs Assistance Bed Mobility: Supine to Sit;Sit to Supine     Supine to sit: Min guard;HOB elevated Sit to supine: Mod assist   General bed mobility comments: Extra time and min guard assist to transition to sit EOB with HOB elevated and utilization of rails. Cues provided to manage legs off bed. ModA to lift legs back into bed with return to supine.    Transfers Overall transfer level: Needs assistance Equipment used: Rolling walker (2 wheeled) Transfers: Sit to/from Stand Sit to Stand: Min assist         General transfer comment: Sit <> stand 1x from EOB and 2x from commode > RW, minA to power up and steady, requiring extra time and cues to transition hands to proper locations on RW (particularly on her L). Needs cues to keep RW proximal to her when preparing to transfer to sit as she tends to push it to the side away from her.  Ambulation/Gait Ambulation/Gait assistance: Min assist;Mod assist Gait Distance (Feet): 20 Feet (x2 bouts of ~20 ft each) Assistive device: Rolling walker (2 wheeled) Gait Pattern/deviations: Step-through pattern;Decreased stride length;Decreased dorsiflexion - right;Decreased dorsiflexion - left;Trunk flexed Gait velocity: decreased Gait velocity interpretation: <1.31 ft/sec, indicative of household ambulator General Gait Details: Pt with slow, unsteady gait, taking small bil steps while maintaining a trunk flexed posture. Min-modA to maintain her balance, particularly when she impulsively pushes her RW away from her when approaching the sink or a place to sit.  Stairs            Wheelchair Mobility    Modified Rankin (Stroke Patients Only) Modified Rankin (  Stroke Patients Only) Pre-Morbid Rankin Score: No symptoms Modified Rankin: Moderately severe disability     Balance Overall balance assessment: Needs assistance Sitting-balance support: Feet supported;No upper  extremity supported Sitting balance-Leahy Scale: Fair Sitting balance - Comments: Static sitting EOB no LOB, min guard for safety.   Standing balance support: Bilateral upper extremity supported;Single extremity supported;During functional activity Standing balance-Leahy Scale: Poor Standing balance comment: Reliant on 1-2 UE support and min-modA.                             Pertinent Vitals/Pain Pain Assessment: Faces Faces Pain Scale: No hurt Pain Intervention(s): Monitored during session    Home Living Family/patient expects to be discharged to:: Private residence Living Arrangements: Alone Available Help at Discharge: Family;Available PRN/intermittently Type of Home: House Home Access: Stairs to enter Entrance Stairs-Rails: Can reach both;Right;Left Entrance Stairs-Number of Steps: 1 + 2 + 1 additional step Home Layout: One level Home Equipment: Cane - single point;Grab bars - toilet;Grab bars - tub/shower      Prior Function Level of Independence: Independent         Comments: pt drives, shops for groceries, manages finances and medications     Hand Dominance   Dominant Hand: Right    Extremity/Trunk Assessment   Upper Extremity Assessment Upper Extremity Assessment: Defer to OT evaluation    Lower Extremity Assessment Lower Extremity Assessment: Generalized weakness;LLE deficits/detail LLE Deficits / Details: Noted decreased strength compared to R with functional mobility, MMT of grossly 4- to 4 compared to grossly 4 on R; 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; intact sensation to light touch LLE Sensation: decreased proprioception (noted with mobility; able to detect location of light touch with precision with eyes closed, denies numbness/tingling) LLE Coordination: decreased fine motor;decreased gross motor (dysdiadochokinesia noted)    Cervical / Trunk Assessment Cervical / Trunk Assessment: Normal  Communication   Communication: Other (comment)  (difficult to understand at times; no teeth; soft voice)  Cognition Arousal/Alertness: Awake/alert Behavior During Therapy: Flat affect;Impulsive Overall Cognitive Status: Impaired/Different from baseline Area of Impairment: Attention;Memory;Safety/judgement;Awareness;Following commands;Problem solving                   Current Attention Level: Sustained Memory: Decreased short-term memory Following Commands: Follows one step commands with increased time Safety/Judgement: Decreased awareness of safety;Decreased awareness of deficits Awareness: Emergent Problem Solving: Slow processing;Requires verbal cues (SLOW processing) General Comments: A&Ox4. Inattention to L side noted during functional tasks, needing repeated cues for L UE placement on RW. Pt with very slow processing and poor safety awareness needing repeated cues to keep RW proximal to her and not leave it behind. Poor attention span. flat affect throughout, intermittently speaking with very soft voice. Slight impulsivity.      General Comments General comments (skin integrity, edema, etc.): VSS on RA    Exercises     Assessment/Plan    PT Assessment Patient needs continued PT services  PT Problem List Decreased strength;Decreased activity tolerance;Decreased balance;Decreased mobility;Decreased coordination;Decreased knowledge of use of DME;Decreased cognition;Decreased safety awareness;Impaired sensation       PT Treatment Interventions DME instruction;Balance training;Gait training;Neuromuscular re-education;Stair training;Functional mobility training;Patient/family education;Therapeutic activities;Therapeutic exercise;Cognitive remediation    PT Goals (Current goals can be found in the Care Plan section)  Acute Rehab PT Goals Patient Stated Goal: to go to bathroom PT Goal Formulation: With patient Time For Goal Achievement: 05/26/21 Potential to Achieve Goals: Good    Frequency Min 4X/week  Barriers to  discharge        Co-evaluation               AM-PAC PT "6 Clicks" Mobility  Outcome Measure Help needed turning from your back to your side while in a flat bed without using bedrails?: A Little Help needed moving from lying on your back to sitting on the side of a flat bed without using bedrails?: A Little Help needed moving to and from a bed to a chair (including a wheelchair)?: A Little Help needed standing up from a chair using your arms (e.g., wheelchair or bedside chair)?: A Little Help needed to walk in hospital room?: A Lot Help needed climbing 3-5 steps with a railing? : Total 6 Click Score: 15    End of Session Equipment Utilized During Treatment: Gait belt Activity Tolerance: Patient tolerated treatment well Patient left: in bed;with call bell/phone within reach;with bed alarm set   PT Visit Diagnosis: Other abnormalities of gait and mobility (R26.89);History of falling (Z91.81);Unsteadiness on feet (R26.81);Muscle weakness (generalized) (M62.81);Difficulty in walking, not elsewhere classified (R26.2);Other symptoms and signs involving the nervous system (R29.898)    Time: 3354-5625 PT Time Calculation (min) (ACUTE ONLY): 24 min   Charges:   PT Evaluation $PT Eval Moderate Complexity: 1 Mod PT Treatments $Therapeutic Activity: 8-22 mins        Moishe Spice, PT, DPT Acute Rehabilitation Services  Pager: [phone] Office: [place] 05/12/2021, 5:37 PM

## 2021-05-12 NOTE — Progress Notes (Addendum)
Occupational Therapy Evaluation Patient Details Name: Madison Davidson MRN: [id number] DOB: [birth year] Today's Date: 05/12/2021    History of Present Illness Pt is a 70 y.o. female admitted to [facility] for a fall with brief loss of consciousness and then developed transient left leg numbness and slurred speech. Transferred to [facility] for planned right MCA intracranial stenting. 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 PMH of DM, HTN, skin cancer, CAD, CVA.   Clinical Impression   Per chart review, pt lived alone and was independent with ADL/ IADL and mobility PTA. Pt demonstrates apparent visual perceptual deficits with inattention, deficits with executive level skills, impaired attention, processing and STM in addition to  impaired incoordination LUE. Recommend CIR consult. Will follow acutely. Noted pocketing food L cheek. Requested ST consult - MD notified.     Follow Up Recommendations  CIR    Equipment Recommendations  3 in 1 bedside commode    Recommendations for Other Services Rehab consult     Precautions / Restrictions Precautions Precautions: Fall Precaution Comments: pocketing food L chk Restrictions Weight Bearing Restrictions: No      Mobility Bed Mobility Overal bed mobility: Needs Assistance Bed Mobility: Supine to Sit     Supine to sit: Min assist     General bed mobility comments: Assist to scoot EOB; difficulty shifting weight to progress EOB    Transfers Overall transfer level: Needs assistance   Transfers: Sit to/from Stand Sit to Stand: Min assist              Balance Overall balance assessment: Needs assistance   Sitting balance-Leahy Scale: Fair       Standing balance-Leahy Scale: Fair                             ADL either performed or assessed with clinical judgement   ADL Overall ADL's : Needs  assistance/impaired Eating/Feeding: Supervision/ safety;Set up Eating/Feeding Details (indicate cue type and reason): note pocketing of food L side of mouth; cues to clear Grooming: Supervision/safety;Set up;Standing Grooming Details (indicate cue type and reason): required cues to find soap on L wall Upper Body Bathing: Set up;Supervision/ safety;Sitting   Lower Body Bathing: Sit to/from stand;Moderate assistance Lower Body Bathing Details (indicate cue type and reason): mod redirectonal cues with any distraction Upper Body Dressing : Minimal assistance   Lower Body Dressing: Moderate assistance;Sit to/from stand   Toilet Transfer: Minimal assistance;Ambulation;Grab bars   Toileting- Clothing Manipulation and Hygiene: Min guard;Sit to/from stand       Functional mobility during ADLs: Minimal assistance;Cueing for safety General ADL Comments: very slow; easily distracted     Vision Baseline Vision/History: Wears glasses Wears Glasses: Distance only (driving only) Patient Visual Report: No change from baseline Vision Assessment?: Yes Eye Alignment: Within Functional Limits Ocular Range of Motion: Within Functional Limits Alignment/Gaze Preference: Within Defined Limits Tracking/Visual Pursuits: Requires cues, head turns, or add eye shifts to track Saccades: Additional head turns occurred during testing Visual Fields: No apparent deficits Additional Comments: PT does not report any changes     Perception Perception Perception Tested?: Yes Perception Deficits: Inattention/neglect;Spatial orientation Inattention/Neglect: Impaired- to be further tested in functional context Spatial deficits: abnormal positioniong  of numbers on clock; note perseveration adn difficulty sequencing; unable to set time   [place] tested?: Deficits Deficits: Initiation;Perseveration    Pertinent  Vitals/Pain Pain Assessment: No/denies pain     Hand Dominance Right    Extremity/Trunk Assessment Upper Extremity Assessment Upper Extremity Assessment: LUE deficits/detail LUE Deficits / Details: AROM and strength WFL for ADL; note incoordination LUE Sensation: decreased proprioception ("feels likel it's heavy adn trying to fall asleep") LUE Coordination: decreased fine motor   Lower Extremity Assessment Lower Extremity Assessment: Defer to PT evaluation   Cervical / Trunk Assessment Cervical / Trunk Assessment: Normal   Communication Communication Communication: Other (comment) (difficult to understand at times; no teeth)   Cognition Arousal/Alertness: Awake/alert Behavior During Therapy: Flat affect Overall Cognitive Status: Impaired/Different from baseline Area of Impairment: Attention;Memory;Safety/judgement;Awareness;Following commands;Problem solving                   Current Attention Level: Sustained Memory: Decreased short-term memory Following Commands: Follows one step commands with increased time Safety/Judgement: Decreased awareness of safety;Decreased awareness of deficits Awareness: Emergent Problem Solving: Slow processing (SLOW processing) General Comments: inattention noted during functional tasks   General Comments       Exercises     Shoulder Instructions      Home Living Family/patient expects to be discharged to:: Private residence Living Arrangements: Alone Available Help at Discharge: Family;Available PRN/intermittently Type of Home: House Home Access: Stairs to enter Entrance Stairs-Number of Steps: 1 + 2 + 1 additional step Entrance Stairs-Rails: Can reach both;Right;Left Home Layout: One level     Bathroom Shower/Tub: Teacher, early years/pre: Standard Bathroom Accessibility: No   Home Equipment: Cane - single point;Grab bars - toilet;Grab bars - tub/shower          Prior Functioning/Environment Level of Independence: Independent        Comments: pt drives, shops for groceries,  manages finances and medications        OT Problem List: Decreased strength;Decreased activity tolerance;Impaired balance (sitting and/or standing);Impaired vision/perception;Decreased coordination;Decreased cognition;Decreased safety awareness;Decreased knowledge of use of DME or AE;Impaired sensation;Obesity      OT Treatment/Interventions: Self-care/ADL training;Therapeutic exercise;Neuromuscular education;DME and/or AE instruction;Therapeutic activities;Cognitive remediation/compensation;Visual/perceptual remediation/compensation;Patient/family education;Balance training    OT Goals(Current goals can be found in the care plan section) Acute Rehab OT Goals Patient Stated Goal: to get back to bed OT Goal Formulation: Patient unable to participate in goal setting Time For Goal Achievement: 05/26/21 Potential to Achieve Goals: Good  OT Frequency: Min 2X/week   Barriers to D/C:            Co-evaluation              AM-PAC OT "6 Clicks" Daily Activity     Outcome Measure Help from another person eating meals?: A Little Help from another person taking care of personal grooming?: A Little Help from another person toileting, which includes using toliet, bedpan, or urinal?: A Little Help from another person bathing (including washing, rinsing, drying)?: A Lot Help from another person to put on and taking off regular upper body clothing?: A Little Help from another person to put on and taking off regular lower body clothing?: A Lot 6 Click Score: 16   End of Session Equipment Utilized During Treatment: Gait belt Nurse Communication: Mobility status  Activity Tolerance: Patient tolerated treatment well Patient left: in chair;with call bell/phone within reach;with chair alarm set  OT Visit Diagnosis: Unsteadiness on feet (R26.81);Other abnormalities of gait and mobility (R26.89);Muscle weakness (generalized) (M62.81);Other symptoms and signs involving the nervous system  (R29.898);Other symptoms and signs involving cognitive function  Time: 9417-4081 OT Time Calculation (min): 48 min Charges:  OT General Charges $OT Visit: 1 Visit OT Evaluation $OT Eval Moderate Complexity: 1 Mod OT Treatments $Self Care/Home Management : 23-37 mins  Maurie Boettcher, OT/L   Acute OT Clinical Specialist Acute Rehabilitation Services Pager [phone] Office [phone]   [facility] 05/12/2021, 11:07 AM

## 2021-05-12 NOTE — Plan of Care (Signed)

## 2021-05-12 NOTE — Progress Notes (Signed)
STROKE TEAM PROGRESS NOTE   SUBJECTIVE (INTERVAL HISTORY) Her OT is at the bedside.  Overall her condition is stable. She had right MCA stenting yesterday, doing well with procedure. Today, she is sitting in chair for breakfast, neuro stable unchanged, still has psychomotor slowing, OT recommend speech for cognitive evaluation.    OBJECTIVE Temp:  [96.8 F (36 C)-98.8 F (37.1 C)] 98.1 F (36.7 C) (06/17 0800) Pulse Rate:  [59-73] 68 (06/17 1100) Cardiac Rhythm: Normal sinus rhythm (06/17 0800) Resp:  [11-22] 18 (06/17 1100) BP: (100-207)/(35-99) 129/41 (06/17 1100) SpO2:  [82 %-100 %] 82 % (06/17 1100) Arterial Line BP: (115-141)/(44-47) 141/47 (06/16 1658)  Recent Labs  Lab 05/11/21 1626 05/11/21 1941 05/11/21 2322 05/12/21 0328 05/12/21 0751  GLUCAP 121* 164* 152* 142* 151*   Recent Labs  Lab 05/08/21 1258 05/09/21 1306 05/11/21 0206 05/12/21 0445  NA 137 135 137 136  K 4.0 4.0 3.9 4.1  CL 104 103 103 102  CO2 22 25 23 24   GLUCOSE 232* 262* 157* 144*  BUN 23 18 14 14   CREATININE 1.07* 0.75 0.82 0.84  CALCIUM 9.2 8.9 9.0 8.8*  MG  --  2.0 2.2  --   PHOS  --  3.0 4.3  --    Recent Labs  Lab 05/09/21 1306  ALBUMIN 3.6   Recent Labs  Lab 05/08/21 1258 05/09/21 1306 05/11/21 0206 05/12/21 0445  WBC 9.0 8.2 9.4 8.8  HGB 11.7* 12.2 12.0 10.1*  HCT 37.0 37.6 38.3 31.9*  MCV 79.1* 78.8* 80.3 80.4  PLT 309 320 340 305   No results for input(s): CKTOTAL, CKMB, CKMBINDEX, TROPONINI in the last 168 hours. No results for input(s): LABPROT, INR in the last 72 hours. No results for input(s): COLORURINE, LABSPEC, [place], GLUCOSEU, HGBUR, BILIRUBINUR, KETONESUR, PROTEINUR, UROBILINOGEN, NITRITE, LEUKOCYTESUR in the last 72 hours.  Invalid input(s): APPERANCEUR     Component Value Date/Time   CHOL 198 05/09/2021 0519   TRIG 220 (H) 05/09/2021 0519   HDL 35 (L) 05/09/2021 0519   CHOLHDL 5.7 05/09/2021 0519   VLDL 44 (H) 05/09/2021 0519   LDLCALC 119 (H)  05/09/2021 0519   Lab Results  Component Value Date   HGBA1C 7.1 (H) 05/09/2021      Component Value Date/Time   LABOPIA NONE DETECTED 05/11/2021 1023   COCAINSCRNUR NONE DETECTED 05/11/2021 1023   LABBENZ NONE DETECTED 05/11/2021 1023   AMPHETMU NONE DETECTED 05/11/2021 1023   THCU NONE DETECTED 05/11/2021 1023   LABBARB NONE DETECTED 05/11/2021 1023    No results for input(s): ETH in the last 168 hours.  I have personally reviewed the radiological images below and agree with the radiology interpretations.  CT ANGIO HEAD W OR WO CONTRAST  Result Date: 05/09/2021 CLINICAL DATA:  Acute stroke on MRI brain EXAM: CT ANGIOGRAPHY HEAD TECHNIQUE: Multidetector CT imaging of the head was performed using the standard protocol during bolus administration of intravenous contrast. Multiplanar CT image reconstructions and MIPs were obtained to evaluate the vascular anatomy. CONTRAST:  33mL OMNIPAQUE IOHEXOL 350 MG/ML SOLN COMPARISON:  CT 05/08/2021, correlation made with MRI brain 05/08/2021 FINDINGS: CT HEAD Brain: Evolving recent infarction is again identified within the right corona radiata. Small area of right frontal cortical involvement on the MRI is not visible by CT. There is no acute intracranial hemorrhage. No significant mass effect. Chronic infarct of the left basal ganglia and adjacent white matter with ex vacuo dilatation of the adjacent lateral ventricle. No hydrocephalus. No extra-axial collection.  Vascular: There is atherosclerotic calcification at the skull base. Skull: Calvarium is unremarkable. Sinuses/Orbits: No acute finding. Other: None. CTA HEAD Anterior circulation: Intracranial internal carotid arteries are patent with calcified plaque but no significant stenosis. Anterior cerebral arteries are patent. There is eccentric plaque at the origin of the right MCA with marked stenosis. High-grade stenosis is present at the right MCA bifurcation. Left MCA is patent. There is moderate  stenosis of an anterior division M2 branch. Posterior circulation: Dominant intracranial right vertebral artery is patent. Proximal intracranial left vertebral artery is irregular with diminished enhancement. There is improved opacification more distally beginning at the PICA origin, which may reflect retrograde flow. Basilar artery is patent. Major cerebellar artery origins are patent. A left posterior communicating artery is present. There is irregularity of the left P2 PCA with moderate to marked stenosis. Venous sinuses: As permitted by contrast timing, patent. Review of the MIP images confirms the above findings. IMPRESSION: Evolving recent infarction of the right corona radiata. No acute intracranial hemorrhage. 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 Moderate left M2 MCA branch stenosis. Diffusely irregular left vertebral artery with diminished flow. There is improvement beginning at the PICA origin likely from retrograde flow. Irregular left P2 PCA with moderate to marked stenosis. Electronically Signed   By: Macy Mis M.D.   On: 05/09/2021 14:35   CT Head Wo Contrast  Result Date: 05/08/2021 CLINICAL DATA:  TIA.  Weakness and left knee pain EXAM: CT HEAD WITHOUT CONTRAST TECHNIQUE: Contiguous axial images were obtained from the base of the skull through the vertex without intravenous contrast. COMPARISON:  CT head 09/13/2017 FINDINGS: Brain: Ventricle size and cerebral volume normal for age. Ill-defined hypodensities in the right frontal white matter are new since the prior study. These are compatible with infarct of indeterminate age, possibly subacute. Well-defined hypodensity left basal ganglia and internal capsule anteriorly compatible with chronic infarct. This was not present previously. Negative for hemorrhage or mass. Vascular: Negative for hyperdense vessel Skull: Negative Sinuses/Orbits: Negative Other: None IMPRESSION: Ill-defined hypodensities right frontal white matter  compatible with ischemia of indeterminate age. Possible subacute Chronic infarct left basal ganglia No acute hemorrhage. Electronically Signed   By: Franchot Gallo M.D.   On: 05/08/2021 13:39   MR BRAIN WO CONTRAST  Result Date: 05/08/2021 CLINICAL DATA:  Weakness. EXAM: MRI HEAD WITHOUT CONTRAST TECHNIQUE: Multiplanar, multiecho pulse sequences of the brain and surrounding structures were obtained without intravenous contrast. COMPARISON:  Head CT 05/08/2021 FINDINGS: Brain: There are acute to early subacute infarcts involving white matter in the right frontal lobe primarily at the level of the corona radiata, and there is also a single subcentimeter acute or early subacute cortical infarct laterally in the right frontal lobe. There is a chronic infarct in the left basal ganglia with associated chronic blood products. There is also a small chronic cortical infarct in the posteroinferior left occipital lobe. Mild cerebral atrophy is within normal limits for age. No mass, midline shift, or extra-axial fluid collection is evident. Vascular: Major intracranial vascular flow voids are preserved. Skull and upper cervical spine: Unremarkable bone marrow signal. Sinuses/Orbits: Unremarkable orbits. Paranasal sinuses and mastoid air cells are clear. Other: None. IMPRESSION: 1. Acute to early subacute right frontal lobe infarcts. 2. 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 Electronically Signed   By: Logan Bores M.D.   On: 05/08/2021 16:58   US Carotid Bilateral (at [facility] only)  Result Date: 05/09/2021 CLINICAL DATA:  70 year old female  with a history of CVA EXAM: BILATERAL CAROTID DUPLEX ULTRASOUND TECHNIQUE: Pearline Cables scale imaging, color Doppler and duplex ultrasound were performed of bilateral carotid and vertebral arteries in the neck. COMPARISON:  None. FINDINGS: Criteria: Quantification of carotid stenosis is based on velocity parameters that correlate the residual internal carotid diameter  with NASCET-based stenosis levels, using the diameter of the distal internal carotid lumen as the denominator for stenosis measurement. The following velocity measurements were obtained: RIGHT ICA:  Systolic 025 cm/sec, Diastolic 19 cm/sec CCA:  852 cm/sec SYSTOLIC ICA/CCA RATIO:  1.5 ECA:  128 cm/sec LEFT ICA:  Systolic 95 cm/sec, Diastolic 11 cm/sec CCA:  84 cm/sec SYSTOLIC ICA/CCA RATIO:  1.1 ECA:  157 cm/sec Right Brachial SBP: Not acquired Left Brachial SBP: Not acquired RIGHT CAROTID ARTERY: No significant calcified disease of the right common carotid artery. Intermediate waveform maintained. Heterogeneous plaque without significant calcifications at the right carotid bifurcation. Low resistance waveform of the right ICA. No significant tortuosity. RIGHT VERTEBRAL ARTERY: Antegrade flow with low resistance waveform. LEFT CAROTID ARTERY: No significant calcified disease of the left common carotid artery. Intermediate waveform maintained. Heterogeneous plaque at the left carotid bifurcation without significant calcifications. Low resistance waveform of the left ICA. LEFT VERTEBRAL ARTERY: Left vertebral artery not identified. IMPRESSION: Color duplex indicates moderate heterogeneous plaque with no hemodynamically significant stenosis by duplex criteria in the extracranial cerebrovascular circulation. Left vertebral artery waveform not identified. Signed, Dulcy Fanny. Dellia Nims, RPVI [facility] Electronically Signed   By: Corrie Mckusick D.O.   On: 05/09/2021 08:19   DG Knee Complete 4 Views Left  Result Date: 05/08/2021 CLINICAL DATA:  Recent fall with left knee pain, initial encounter EXAM: LEFT KNEE - COMPLETE 4+ VIEW COMPARISON:  None. FINDINGS: No evidence of fracture, dislocation, or joint effusion. No evidence of arthropathy or other focal bone abnormality. Soft tissues are unremarkable. IMPRESSION: No acute abnormality noted. Electronically Signed    By: Inez Catalina M.D.   On: 05/08/2021 15:32   ECHOCARDIOGRAM COMPLETE  Result Date: 05/09/2021    ECHOCARDIOGRAM REPORT   Patient Name:   ADELINE PETITFRERE Larcom Date of Exam: 05/09/2021 Medical Rec #:  [id number]        Height:       62.0 in Accession #:    [id number]       Weight:       175.5 lb Date of Birth:  12-28-1950       BSA:          1.808 m Patient Age:    40 years         BP:           179/73 mmHg Patient Gender: F                HR:           73 bpm. Exam Location:  [facility] Procedure: 2D Echo, Cardiac Doppler, Color Doppler and [place] Contrast Bubble            Study Indications:     Stroke I63.9  History:         Patient has no prior history of Echocardiogram examinations.                  Stroke; Risk Factors:Diabetes.  Sonographer:     Sherrie Sport RDCS (AE) Referring Phys:  [place] Diagnosing Phys: Ida Rogue MD IMPRESSIONS  1. Left ventricular ejection fraction, by estimation, is 60  to 65%. The left ventricle has normal function. The left ventricle has no regional wall motion abnormalities. Left ventricular diastolic parameters are indeterminate.  2. Right ventricular systolic function is normal. The right ventricular size is normal. FINDINGS  Left Ventricle: Left ventricular ejection fraction, by estimation, is 60 to 65%. The left ventricle has normal function. The left ventricle has no regional wall motion abnormalities. The left ventricular internal cavity size was normal in size. There is  no left ventricular hypertrophy. Left ventricular diastolic parameters are indeterminate. Right Ventricle: The right ventricular size is normal. No increase in right ventricular wall thickness. Right ventricular systolic function is normal. Left Atrium: Left atrial size was normal in size. Right Atrium: Right atrial size was normal in size. Pericardium: There is no evidence of pericardial effusion. Mitral Valve: The mitral valve was not well visualized. No evidence of mitral valve regurgitation. No  evidence of mitral valve stenosis. Tricuspid Valve: The tricuspid valve is not well visualized. Tricuspid valve regurgitation is not demonstrated. No evidence of tricuspid stenosis. Aortic Valve: The aortic valve was not well visualized. Aortic valve regurgitation is not visualized. No aortic stenosis is present. Pulmonic Valve: The pulmonic valve was not well visualized. Pulmonic valve regurgitation is not visualized. No evidence of pulmonic stenosis. Aorta: The aortic root is normal in size and structure. Venous: The inferior vena cava is normal in size with greater than 50% respiratory variability, suggesting right atrial pressure of 3 mmHg. IAS/Shunts: No atrial level shunt detected by color flow Doppler. Agitated saline contrast was given intravenously to evaluate for intracardiac shunting.  LEFT VENTRICLE PLAX 2D LVIDd:         3.70 cm LVIDs:         2.30 cm LV PW:         1.20 cm LV IVS:        1.30 cm LVOT diam:     2.10 cm LVOT Area:     3.46 cm  LEFT ATRIUM         Index LA diam:    3.20 cm 1.77 cm/m                        PULMONIC VALVE AORTA                 PV Vmax:        0.99 m/s Ao Root diam: 3.20 cm PV Peak grad:   3.9 mmHg                       RVOT Peak grad: 5 mmHg   SHUNTS Systemic Diam: 2.10 cm Ida Rogue MD Electronically signed by Ida Rogue MD Signature Date/Time: 05/09/2021/5:06:01 PM    Final      PHYSICAL EXAM  Temp:  [96.8 F (36 C)-98.8 F (37.1 C)] 98.1 F (36.7 C) (06/17 0800) Pulse Rate:  [59-73] 68 (06/17 1100) Resp:  [11-22] 18 (06/17 1100) BP: (100-207)/(35-99) 129/41 (06/17 1100) SpO2:  [82 %-100 %] 82 % (06/17 1100) Arterial Line BP: (115-141)/(44-47) 141/47 (06/16 1658)  General - Well nourished, well developed, in no apparent distress. Ophthalmologic - fundi not visualized due to noncooperation.  Cardiovascular - Regular rhythm and rate.  Mental Status -  Level of arousal and orientation to time, place, and person were intact. Language including  expression, naming, repetition, comprehension was assessed and found intact. Mild dysarthria due to poor denture, however, significant psychomotor slowing  Cranial Nerves II -  XII - II - Visual field intact OU. III, IV, VI - Extraocular movements intact. V - Facial sensation intact bilaterally. VII - Facial movement intact bilaterally. VIII - Hearing & vestibular intact bilaterally. X - Palate elevates symmetrically. XI - Chin turning & shoulder shrug intact bilaterally. XII - Tongue protrusion intact.  Motor Strength - The patient's strength was normal in all extremities and pronator drift was absent except mild decreased finger grip on the left.  Bulk was normal and fasciculations were absent.   Motor Tone - Muscle tone was assessed at the neck and appendages and was normal.  Reflexes - The patient's reflexes were symmetrical in all extremities and she had no pathological reflexes.  Sensory - Light touch, temperature/pinprick were assessed and were symmetrical.    Coordination - The patient had normal movements in the hands with no ataxia or dysmetria.  Tremor was absent.  Gait and Station - deferred.   ASSESSMENT/PLAN Ms. Madison Davidson is a 70 y.o. female with history of hypertension, hyperlipidemia, diabetes, obesity initially admitted to [facility] for a fall with brief loss of consciousness and then developed transient left leg numbness and slurred speech. She was transferred to [facility] for planned right MCA intracranial stenting.  Stroke:  right CR/SO infarcts, likely secondary to large vessel disease source with right MCA high grade stenosis MRI showed right CR/SO infarcts, as well as chronic left BG and occipital infarcts.   CTA head showed right MCA severe stenosis, left M2 and P2 moderate stenosis.   Carotid Doppler negative.   IR multiple intracranial stenosis, right distal M1 stenosis treated with stenting.  EF 60 to 65%  A1c 7.1  LDL 119  SCDs for VTE prophylaxis No  antithrombotic prior to admission, now on aspirin 81 mg daily and clopidogrel 75 mg daily DAPT for 3 months and then ASA alone given multifocal intracranial stenosis.  Patient counseled to be compliant with her antithrombotic medications Ongoing aggressive stroke risk factor management Therapy recommendations:  CIR Disposition:  pending   Intracranial stenosis CTA head showed right MCA severe stenosis, left M2 and P2 moderate stenosis.   IR multiple intracranial stenosis, right distal M1 stenosis treated with stenting. On aspirin 81 mg daily and clopidogrel 75 mg daily DAPT for 3 months and then ASA alone  Diabetes HgbA1c 7.1 goal < 7.0 Uncontrolled CBG monitoring SSI DM education and close PCP follow up  Hypertension Stable Long term BP goal normotensive  Hyperlipidemia Home meds:  none  LDL 119, goal < 70 Now on lipitor 80 Continue statin at discharge  Cognitive impairment Psychomotor slowing, decreased concentration and attention on exam Could be vascular etiology given multiple risk factors OT recommend speech to eval Check dementia labs pending Follow up as outpt to consider neuropsych testing  Other Stroke Risk Factors Advanced age Obesity, Body mass index is 32.86 kg/m.   Other [facility] day # 1  Neurology will sign off. Please call with questions. Pt will follow up with stroke clinic NP at [facility] in about 4 weeks. Thanks for the consult.   Rosalin Hawking, MD PhD Stroke Neurology 05/12/2021 11:30 AM    To contact Stroke Continuity provider, please refer to [email or web address] After hours, contact General Neurology

## 2021-05-12 NOTE — Progress Notes (Signed)
Referring Physician(s): Irene Pap, DO  Supervising Physician: Pedro Earls  Patient Status:  [facility] - In-pt  Chief Complaint: Follow up distal M1/MCA segment angioplasty and stenting 05/11/21 with Dr Tennis Must Sindy Messing  Subjective:  Patient sitting up in bed working with OT, voice is soft and difficult to understand at times. She is able to follow simple commands but becomes easily distracted. She answer orientation questions appropriately but has a hard time stating why she was here and why she the procedure yesterday. She wants to get out of bed to use the bathroom.  Per OT at bedside patient noted to be exhibiting poor coordination of LUE, inattention and pocketing food in her left cheek.  Allergies: Patient has no known allergies.  Medications: Prior to Admission medications   Medication Sig Start Date End Date Taking? Authorizing Provider  aspirin EC 81 MG tablet Take 81 mg by mouth daily as needed (headache).    [provider]  atorvastatin (LIPITOR) 20 MG tablet Take 4 tablets (80 mg total) by mouth at bedtime. 05/10/21   Geradine Girt, DO  citalopram (CELEXA) 10 MG tablet Take 10 mg by mouth daily.    Marguerita Merles, MD  clopidogrel (PLAVIX) 75 MG tablet Take 1 tablet (75 mg total) by mouth daily. 05/11/21   Geradine Girt, DO  levothyroxine (SYNTHROID) 125 MCG tablet Take 1 tablet (125 mcg total) by mouth daily at 6 (six) AM. 05/11/21   Geradine Girt, DO  meloxicam (MOBIC) 15 MG tablet Take 1 tablet by mouth daily. 04/12/21   [provider]  nortriptyline (PAMELOR) 10 MG capsule Take 10 mg by mouth at bedtime.     Marguerita Merles, MD     Vital Signs: BP (!) 130/50 (BP Location: Left Arm)   Pulse 66   Temp 98.1 F (36.7 C) (Axillary)   Resp 19   Ht 5\' 2"  (1.575 m)   Wt 179 lb 10.8 oz (81.5 kg)   SpO2 96%   BMI 32.86 kg/m   Physical Exam Vitals and nursing note reviewed.  Constitutional:      General: She is not in acute  distress. HENT:     Head: Normocephalic.  Cardiovascular:     Rate and Rhythm: Normal rate.     Comments: (+) right radial puncture site clean, dry, dressed appropriately. No active bleeding. Pulmonary:     Effort: Pulmonary effort is normal.  Skin:    General: Skin is warm and dry.  Neurological:     Mental Status: She is alert.  Alert, awake, and oriented x 3 - she is able to state she is in the hospital in [place] but is unable to explain why she's here and what the procedure was for yesterday. Speech is soft and difficult to understand. Comprehension in tact - able to follow simple commands  EOMs without nystagmus or subjective diplopia. Visual fields grossly intact No obvious facial asymmetry. Tongue midline Motor power full in all 4 extremities Fine motor and coordination grossly in tact Gait - not assessed Romberg - not assessed Heel to toe - not assessed   Imaging: CT ANGIO HEAD W OR WO CONTRAST  Result Date: 05/09/2021 CLINICAL DATA:  Acute stroke on MRI brain EXAM: CT ANGIOGRAPHY HEAD TECHNIQUE: Multidetector CT imaging of the head was performed using the standard protocol during bolus administration of intravenous contrast. Multiplanar CT image reconstructions and MIPs were obtained to evaluate the vascular anatomy. CONTRAST:  58mL OMNIPAQUE  IOHEXOL 350 MG/ML SOLN COMPARISON:  CT 05/08/2021, correlation made with MRI brain 05/08/2021 FINDINGS: CT HEAD Brain: Evolving recent infarction is again identified within the right corona radiata. Small area of right frontal cortical involvement on the MRI is not visible by CT. There is no acute intracranial hemorrhage. No significant mass effect. Chronic infarct of the left basal ganglia and adjacent white matter with ex vacuo dilatation of the adjacent lateral ventricle. No hydrocephalus. No extra-axial collection. Vascular: There is atherosclerotic calcification at the skull base. Skull: Calvarium is unremarkable. Sinuses/Orbits:  No acute finding. Other: None. CTA HEAD Anterior circulation: Intracranial internal carotid arteries are patent with calcified plaque but no significant stenosis. Anterior cerebral arteries are patent. There is eccentric plaque at the origin of the right MCA with marked stenosis. High-grade stenosis is present at the right MCA bifurcation. Left MCA is patent. There is moderate stenosis of an anterior division M2 branch. Posterior circulation: Dominant intracranial right vertebral artery is patent. Proximal intracranial left vertebral artery is irregular with diminished enhancement. There is improved opacification more distally beginning at the PICA origin, which may reflect retrograde flow. Basilar artery is patent. Major cerebellar artery origins are patent. A left posterior communicating artery is present. There is irregularity of the left P2 PCA with moderate to marked stenosis. Venous sinuses: As permitted by contrast timing, patent. Review of the MIP images confirms the above findings. IMPRESSION: Evolving recent infarction of the right corona radiata. No acute intracranial hemorrhage. 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 Moderate left M2 MCA branch stenosis. Diffusely irregular left vertebral artery with diminished flow. There is improvement beginning at the PICA origin likely from retrograde flow. Irregular left P2 PCA with moderate to marked stenosis. Electronically Signed   By: Macy Mis M.D.   On: 05/09/2021 14:35   CT Head Wo Contrast  Result Date: 05/08/2021 CLINICAL DATA:  TIA.  Weakness and left knee pain EXAM: CT HEAD WITHOUT CONTRAST TECHNIQUE: Contiguous axial images were obtained from the base of the skull through the vertex without intravenous contrast. COMPARISON:  CT head 09/13/2017 FINDINGS: Brain: Ventricle size and cerebral volume normal for age. Ill-defined hypodensities in the right frontal white matter are new since the prior study. These are compatible with  infarct of indeterminate age, possibly subacute. Well-defined hypodensity left basal ganglia and internal capsule anteriorly compatible with chronic infarct. This was not present previously. Negative for hemorrhage or mass. Vascular: Negative for hyperdense vessel Skull: Negative Sinuses/Orbits: Negative Other: None IMPRESSION: Ill-defined hypodensities right frontal white matter compatible with ischemia of indeterminate age. Possible subacute Chronic infarct left basal ganglia No acute hemorrhage. Electronically Signed   By: Franchot Gallo M.D.   On: 05/08/2021 13:39   MR BRAIN WO CONTRAST  Result Date: 05/08/2021 CLINICAL DATA:  Weakness. EXAM: MRI HEAD WITHOUT CONTRAST TECHNIQUE: Multiplanar, multiecho pulse sequences of the brain and surrounding structures were obtained without intravenous contrast. COMPARISON:  Head CT 05/08/2021 FINDINGS: Brain: There are acute to early subacute infarcts involving white matter in the right frontal lobe primarily at the level of the corona radiata, and there is also a single subcentimeter acute or early subacute cortical infarct laterally in the right frontal lobe. There is a chronic infarct in the left basal ganglia with associated chronic blood products. There is also a small chronic cortical infarct in the posteroinferior left occipital lobe. Mild cerebral atrophy is within normal limits for age. No mass, midline shift, or extra-axial fluid collection is evident. Vascular: Major intracranial  vascular flow voids are preserved. Skull and upper cervical spine: Unremarkable bone marrow signal. Sinuses/Orbits: Unremarkable orbits. Paranasal sinuses and mastoid air cells are clear. Other: None. IMPRESSION: 1. Acute to early subacute right frontal lobe infarcts. 2. 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 Electronically Signed   By: Logan Bores M.D.   On: 05/08/2021 16:58   US Carotid Bilateral (at [facility] and [facility] only)  Result Date: 05/09/2021 CLINICAL DATA:   70 year old female with a history of CVA EXAM: BILATERAL CAROTID DUPLEX ULTRASOUND TECHNIQUE: Pearline Cables scale imaging, color Doppler and duplex ultrasound were performed of bilateral carotid and vertebral arteries in the neck. COMPARISON:  None. FINDINGS: Criteria: Quantification of carotid stenosis is based on velocity parameters that correlate the residual internal carotid diameter with NASCET-based stenosis levels, using the diameter of the distal internal carotid lumen as the denominator for stenosis measurement. The following velocity measurements were obtained: RIGHT ICA:  Systolic 326 cm/sec, Diastolic 19 cm/sec CCA:  712 cm/sec SYSTOLIC ICA/CCA RATIO:  1.5 ECA:  128 cm/sec LEFT ICA:  Systolic 95 cm/sec, Diastolic 11 cm/sec CCA:  84 cm/sec SYSTOLIC ICA/CCA RATIO:  1.1 ECA:  157 cm/sec Right Brachial SBP: Not acquired Left Brachial SBP: Not acquired RIGHT CAROTID ARTERY: No significant calcified disease of the right common carotid artery. Intermediate waveform maintained. Heterogeneous plaque without significant calcifications at the right carotid bifurcation. Low resistance waveform of the right ICA. No significant tortuosity. RIGHT VERTEBRAL ARTERY: Antegrade flow with low resistance waveform. LEFT CAROTID ARTERY: No significant calcified disease of the left common carotid artery. Intermediate waveform maintained. Heterogeneous plaque at the left carotid bifurcation without significant calcifications. Low resistance waveform of the left ICA. LEFT VERTEBRAL ARTERY: Left vertebral artery not identified. IMPRESSION: Color duplex indicates moderate heterogeneous plaque with no hemodynamically significant stenosis by duplex criteria in the extracranial cerebrovascular circulation. Left vertebral artery waveform not identified. Signed, Dulcy Fanny. Dellia Nims, RPVI [facility] Electronically Signed   By: Corrie Mckusick D.O.   On: 05/09/2021 08:19   DG Knee  Complete 4 Views Left  Result Date: 05/08/2021 CLINICAL DATA:  Recent fall with left knee pain, initial encounter EXAM: LEFT KNEE - COMPLETE 4+ VIEW COMPARISON:  None. FINDINGS: No evidence of fracture, dislocation, or joint effusion. No evidence of arthropathy or other focal bone abnormality. Soft tissues are unremarkable. IMPRESSION: No acute abnormality noted. Electronically Signed   By: Inez Catalina M.D.   On: 05/08/2021 15:32   ECHOCARDIOGRAM COMPLETE  Result Date: 05/09/2021    ECHOCARDIOGRAM REPORT   Patient Name:   Madison Davidson Date of Exam: 05/09/2021 Medical Rec #:  [id number]        Height:       62.0 in Accession #:    [id number]       Weight:       175.5 lb Date of Birth:  02-05-1951       BSA:          1.808 m Patient Age:    31 years         BP:           179/73 mmHg Patient Gender: F                HR:           73 bpm. Exam Location:  [facility] Procedure: 2D Echo, Cardiac Doppler, Color Doppler and Saline Contrast Bubble  Study Indications:     Stroke I63.9  History:         Patient has no prior history of Echocardiogram examinations.                  Stroke; Risk Factors:Diabetes.  Sonographer:     Sherrie Sport RDCS (AE) Referring Phys:  [place] Diagnosing Phys: Ida Rogue MD IMPRESSIONS  1. Left ventricular ejection fraction, by estimation, is 60 to 65%. The left ventricle has normal function. The left ventricle has no regional wall motion abnormalities. Left ventricular diastolic parameters are indeterminate.  2. Right ventricular systolic function is normal. The right ventricular size is normal. FINDINGS  Left Ventricle: Left ventricular ejection fraction, by estimation, is 60 to 65%. The left ventricle has normal function. The left ventricle has no regional wall motion abnormalities. The left ventricular internal cavity size was normal in size. There is  no left ventricular hypertrophy. Left ventricular diastolic parameters are indeterminate. Right Ventricle: The  right ventricular size is normal. No increase in right ventricular wall thickness. Right ventricular systolic function is normal. Left Atrium: Left atrial size was normal in size. Right Atrium: Right atrial size was normal in size. Pericardium: There is no evidence of pericardial effusion. Mitral Valve: The mitral valve was not well visualized. No evidence of mitral valve regurgitation. No evidence of mitral valve stenosis. Tricuspid Valve: The tricuspid valve is not well visualized. Tricuspid valve regurgitation is not demonstrated. No evidence of tricuspid stenosis. Aortic Valve: The aortic valve was not well visualized. Aortic valve regurgitation is not visualized. No aortic stenosis is present. Pulmonic Valve: The pulmonic valve was not well visualized. Pulmonic valve regurgitation is not visualized. No evidence of pulmonic stenosis. Aorta: The aortic root is normal in size and structure. Venous: The inferior vena cava is normal in size with greater than 50% respiratory variability, suggesting right atrial pressure of 3 mmHg. IAS/Shunts: No atrial level shunt detected by color flow Doppler. Agitated saline contrast was given intravenously to evaluate for intracardiac shunting.  LEFT VENTRICLE PLAX 2D LVIDd:         3.70 cm LVIDs:         2.30 cm LV PW:         1.20 cm LV IVS:        1.30 cm LVOT diam:     2.10 cm LVOT Area:     3.46 cm  LEFT ATRIUM         Index LA diam:    3.20 cm 1.77 cm/m                        PULMONIC VALVE AORTA                 PV Vmax:        0.99 m/s Ao Root diam: 3.20 cm PV Peak grad:   3.9 mmHg                       RVOT Peak grad: 5 mmHg   SHUNTS Systemic Diam: 2.10 cm Ida Rogue MD Electronically signed by Ida Rogue MD Signature Date/Time: 05/09/2021/5:06:01 PM    Final     Labs:  CBC: Recent Labs    05/08/21 1258 05/09/21 1306 05/11/21 0206 05/12/21 0445  WBC 9.0 8.2 9.4 8.8  HGB 11.7* 12.2 12.0 10.1*  HCT 37.0 37.6 38.3 31.9*  PLT 309 320 340 305  COAGS: No results for input(s): INR, APTT in the last 8760 hours.  BMP: Recent Labs    05/08/21 1258 05/09/21 1306 05/11/21 0206 05/12/21 0445  NA 137 135 137 136  K 4.0 4.0 3.9 4.1  CL 104 103 103 102  CO2 22 25 23 24   GLUCOSE 232* 262* 157* 144*  BUN 23 18 14 14   CALCIUM 9.2 8.9 9.0 8.8*  CREATININE 1.07* 0.75 0.82 0.84  GFRNONAA 56* >60 >60 >60    LIVER FUNCTION TESTS: Recent Labs    05/09/21 1306  ALBUMIN 3.6    Assessment and Plan:  70 y/o F who presented to [facility] ED on 6/13 with weakness and slurred speech the day prior, found to have early subacute right frontal lobe infarcts as well as marked stenoses of the right MCA origin and bifurcation. She was transferred to [facility] and underwent right M1/MCA segment angioplasty and stenting under general anesthesia on 6/16 with Dr Tennis Must Sindy Messing.   On follow up exam today patient appears neurologically stable compared to pre-procedure exam, right radial access site without active bleeding or pain. Patient is working with OT on our exam today who notes concerns regarding visual perceptual deficits with inattention, deficits with executive level skills, impaired attention, processing and STM in addition to impaired incoordination LUE and pocketing food left cheek - they are recommending CIR as well as ST consult. There is also concern that the patient lives alone and reportedly was not taking her medication as prescribed prior to this hospitalization.  Will plan for diagnostic cerebral angiogram in 6 months to assess stent patency - IR schedulers will call patient with date/time.   Further plans per neurology/primary team - NIR remains available as needed, please call with questions or concerns.  Electronically Signed: Joaquim Nam, PA-C 05/12/2021, 10:19 AM   I spent a total of 15 Minutes at the the patient's bedside AND on the patient's hospital floor or unit, greater than 50% of which was counseling/coordinating  care for follow up right MCA stenting.

## 2021-05-12 NOTE — Progress Notes (Signed)
PROGRESS NOTE    Patient: Madison Davidson                            PCP: [facility]                    DOB: 01-Sep-1951            DOA: 05/11/2021 IRW:[id number]             DOS: 05/12/2021, 12:25 PM   LOS: 1 day   Date of Service: The patient was seen and examined on 05/12/2021  Subjective:   The patient was seen and examined this morning, stable no acute distress Status post right MCA stent placement on 05/11/2021 by interventional radiologist, positive tolerated procedure well, stable this morning  Brief Narrative:   Madison Davidson is a 71 y.o. female with medical history significant for type 2 diabetes, hypertension, hyperlipidemia, hypothyroidism, obesity, coronary artery disease, prior CVA, who initially presented to [facility] ED on 05/08/21 due to transient slurring of speech.  She was admitted for stroke work-up.  Work-up revealed severe symptomatic right MCA stenosis.  At [facility] she was followed by neurology who recommended transfer to [facility] for urgent but not emergent intervention, possible intracranial stenting of the right MCA origin and bifurcation.  Patient was seen and examined in her room at [facility] 3W unit.  She has no new complaints at the time of this visit.  States she is sleepy.  Her speech appears improved.   ED Course:  Direct transfer from [facility] to [facility]    Assessment & Plan:   Active Problems:   Intracranial atherosclerosis   Cerebral thrombosis with cerebral infarction     Intracranial atherosclerosis Severe symptomatic right MCA stenosis -05/11/2021 status post IR stenting right distal M1 MCA -tolerated procedure well  Presented with transient slurring of speech at [facility] -No progression of symptoms, no new focal neurological findings Work-up revealed severe symptomatic right MCA stenosis Seen by neurology at [facility] with recommendation for transfer to [facility] for possible intracranial stenting of the right MCA origin and bifurcation. I -Neurology  consulted Dr. Erlinda Hong Per neurology, DAPT aspirin 81 mg daily and Plavix 75 mg daily, continue Lipitor 80 mg daily. on aspirin 81 mg daily and clopidogrel 75 mg daily DAPT for 3 months and then ASA alone given multifocal intracranial stenosis.... Per neurology Patient may need a PCSK9 inhibitor outpatient, LDL not at goal. Allow permissive hypertension up to 220/120.   Hyperlipidemia LDL 119 on 05/09/2021 Goal LDL less than 70. Continue high intensity statin Lipitor 80 mg daily. May need a PCSK9 inhibitor as stated above. -Recommending outpatient lipid allergist evaluation -Stable  Type 2 diabetes with hyperglycemia Hemoglobin A1c 7.1 on 05/09/2021 Goal A1c less than 7.0 Insulin sliding scale -Stable   Hypothyroidism Resume home levothyroxine -Stable   Chronic anxiety/depression Resume home regimen. -Stable  Obesity BMI 32 Recommend weight loss outpatient with regular physical activity and healthy dieting.     DVT prophylaxis: SCDs.  Code Status: Full code.  Family Communication: None at bedside.  Disposition Plan: Home with home health versus CIR-consulted   Consults called: IR, please consult neurology in the morning.   Admission status: Inpatient status.  Patient will require at least 2 midnights for further evaluation and treatment of present condition.     Status is: Inpatient       Dispo:  Patient From: Home            Planned Disposition: Home, possibly on 05/13/2021 or when neurology and IR sign off.           Medically stable for discharge: No                      ----------------------------------------------------------------------------------------------------------------------------------------------- Nutritional status:  The patient's BMI is: Body mass index is 32.86 kg/m. I agree with the assessment and plan as outlined below: Nutrition Status:     ---------------------------------------------------------------------------------------------------------------------------------------------------- Cultures; none   Antimicrobials: None    Consultants:  Neurology/interventional radiology   ------------------------------------------------------------------------------------------------------------------------------------------------  Dispo:  Patient From: Home   Level of care: ICU   Procedures:     Antimicrobials:  Anti-infectives (From admission, onward)    None        Medication:   aspirin EC  81 mg Oral Daily   atorvastatin  80 mg Oral QHS   Chlorhexidine Gluconate Cloth  6 each Topical Daily   clopidogrel  75 mg Oral Daily   insulin aspart  0-15 Units Subcutaneous Q4H   levothyroxine  125 mcg Oral Q0600    acetaminophen, melatonin, ondansetron (ZOFRAN) IV   Objective:   Vitals:   05/12/21 0500 05/12/21 0800 05/12/21 1100 05/12/21 1200  BP: (!) 135/45 (!) 130/50 (!) 129/41   Pulse: 64 66 68   Resp: 18 19 18    Temp:  98.1 F (36.7 C)  97.8 F (36.6 C)  TempSrc:  Axillary  Oral  SpO2: 97% 96% (!) 82%   Weight:      Height:        Intake/Output Summary (Last 24 hours) at 05/12/2021 1225 Last data filed at 05/12/2021 0542 Gross per 24 hour  Intake 363.35 ml  Output 825 ml  Net -461.65 ml   Filed Weights   05/11/21 0046  Weight: 81.5 kg     Examination:      Physical Exam:   General:  Awake alert oriented x3, mild dysarthria  HEENT:  Normocephalic, PERRL, otherwise with in Normal limits   Neuro:  Visual field intact, CNII-XII intact. , normal motor and sensation, reflexes intact , mild reduced grip on the left  Lungs:   Clear to auscultation BL, Respirations unlabored, no wheezes / crackles  Cardio:    S1/S2, RRR, No murmure, No Rubs or Gallops   Abdomen:   Soft, non-tender, bowel sounds active all four quadrants,  no guarding or peritoneal signs.  Muscular skeletal:  Limited exam - in  bed, able to move all 4 extremities, Normal strength,  2+ pulses,  symmetric, No pitting edema  Skin:  Dry, warm to touch, negative for any Rashes,  Wounds: Please see nursing documentation         ------------------------------------------------------------------------------------------------------------------------------------------    LABs:  CBC Latest Ref Rng & Units 05/12/2021 05/11/2021 05/09/2021  WBC 4.0 - 10.5 K/uL 8.8 9.4 8.2  Hemoglobin 12.0 - 15.0 g/dL 10.1(L) 12.0 12.2  Hematocrit 36.0 - 46.0 % 31.9(L) 38.3 37.6  Platelets 150 - 400 K/uL 305 340 320   CMP Latest Ref Rng & Units 05/12/2021 05/11/2021 05/09/2021  Glucose 70 - 99 mg/dL 144(H) 157(H) 262(H)  BUN 8 - 23 mg/dL 14 14 18   Creatinine 0.44 - 1.00 mg/dL 0.84 0.82 0.75  Sodium 135 - 145 mmol/L 136 137 135  Potassium 3.5 - 5.1 mmol/L 4.1 3.9 4.0  Chloride 98 - 111 mmol/L 102 103 103  CO2 22 - 32 mmol/L  24 23 25   Calcium 8.9 - 10.3 mg/dL 8.8(L) 9.0 8.9       Micro Results Recent Results (from the past 240 hour(s))  Resp Panel by RT-PCR (Flu A&B, Covid) Nasopharyngeal Swab     Status: None   Collection Time: 05/08/21  2:19 PM   Specimen: Nasopharyngeal Swab; Nasopharyngeal(NP) swabs in vial transport medium  Result Value Ref Range Status   SARS Coronavirus 2 by RT PCR NEGATIVE NEGATIVE Final    Comment: (NOTE) SARS-CoV-2 target nucleic acids are NOT DETECTED.  The SARS-CoV-2 RNA is generally detectable in upper respiratory specimens during the acute phase of infection. The lowest concentration of SARS-CoV-2 viral copies this assay can detect is 138 copies/mL. A negative result does not preclude SARS-Cov-2 infection and should not be used as the sole basis for treatment or other patient management decisions. A negative result may occur with  improper specimen collection/handling, submission of specimen other than nasopharyngeal swab, presence of viral mutation(s) within the areas targeted by this assay, and  inadequate number of viral copies(<138 copies/mL). A negative result must be combined with clinical observations, patient history, and epidemiological information. The expected result is Negative.  Fact Sheet for Patients:  EntrepreneurPulse.com.au  Fact Sheet for Healthcare Providers:  IncredibleEmployment.be  This test is no t yet approved or cleared by the [place] FDA and  has been authorized for detection and/or diagnosis of SARS-CoV-2 by FDA under an Emergency Use Authorization (EUA). This EUA will remain  in effect (meaning this test can be used) for the duration of the COVID-19 declaration under Section 564(b)(1) of the Act, 21 U.S.C.section 360bbb-3(b)(1), unless the authorization is terminated  or revoked sooner.       Influenza A by PCR NEGATIVE NEGATIVE Final   Influenza B by PCR NEGATIVE NEGATIVE Final    Comment: (NOTE) The Xpert Xpress SARS-CoV-2/FLU/RSV plus assay is intended as an aid in the diagnosis of influenza from Nasopharyngeal swab specimens and should not be used as a sole basis for treatment. Nasal washings and aspirates are unacceptable for Xpert Xpress SARS-CoV-2/FLU/RSV testing.  Fact Sheet for Patients: EntrepreneurPulse.com.au  Fact Sheet for Healthcare Providers: IncredibleEmployment.be  This test is not yet approved or cleared by the [place] FDA and has been authorized for detection and/or diagnosis of SARS-CoV-2 by FDA under an Emergency Use Authorization (EUA). This EUA will remain in effect (meaning this test can be used) for the duration of the COVID-19 declaration under Section 564(b)(1) of the Act, 21 U.S.C. section 360bbb-3(b)(1), unless the authorization is terminated or revoked.  Performed at [facility], [address] [place], [place] [address]     Radiology Reports CT ANGIO HEAD W OR WO CONTRAST  Result Date: 05/09/2021 CLINICAL  DATA:  Acute stroke on MRI brain EXAM: CT ANGIOGRAPHY HEAD TECHNIQUE: Multidetector CT imaging of the head was performed using the standard protocol during bolus administration of intravenous contrast. Multiplanar CT image reconstructions and MIPs were obtained to evaluate the vascular anatomy. CONTRAST:  60mL OMNIPAQUE IOHEXOL 350 MG/ML SOLN COMPARISON:  CT 05/08/2021, correlation made with MRI brain 05/08/2021 FINDINGS: CT HEAD Brain: Evolving recent infarction is again identified within the right corona radiata. Small area of right frontal cortical involvement on the MRI is not visible by CT. There is no acute intracranial hemorrhage. No significant mass effect. Chronic infarct of the left basal ganglia and adjacent white matter with ex vacuo dilatation of the adjacent lateral ventricle. No hydrocephalus. No extra-axial collection. Vascular: There is atherosclerotic  calcification at the skull base. Skull: Calvarium is unremarkable. Sinuses/Orbits: No acute finding. Other: None. CTA HEAD Anterior circulation: Intracranial internal carotid arteries are patent with calcified plaque but no significant stenosis. Anterior cerebral arteries are patent. There is eccentric plaque at the origin of the right MCA with marked stenosis. High-grade stenosis is present at the right MCA bifurcation. Left MCA is patent. There is moderate stenosis of an anterior division M2 branch. Posterior circulation: Dominant intracranial right vertebral artery is patent. Proximal intracranial left vertebral artery is irregular with diminished enhancement. There is improved opacification more distally beginning at the PICA origin, which may reflect retrograde flow. Basilar artery is patent. Major cerebellar artery origins are patent. A left posterior communicating artery is present. There is irregularity of the left P2 PCA with moderate to marked stenosis. Venous sinuses: As permitted by contrast timing, patent. Review of the MIP images  confirms the above findings. IMPRESSION: Evolving recent infarction of the right corona radiata. No acute intracranial hemorrhage. 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 Moderate left M2 MCA branch stenosis. Diffusely irregular left vertebral artery with diminished flow. There is improvement beginning at the PICA origin likely from retrograde flow. Irregular left P2 PCA with moderate to marked stenosis. Electronically Signed   By: Macy Mis M.D.   On: 05/09/2021 14:35   CT Head Wo Contrast  Result Date: 05/08/2021 CLINICAL DATA:  TIA.  Weakness and left knee pain EXAM: CT HEAD WITHOUT CONTRAST TECHNIQUE: Contiguous axial images were obtained from the base of the skull through the vertex without intravenous contrast. COMPARISON:  CT head 09/13/2017 FINDINGS: Brain: Ventricle size and cerebral volume normal for age. Ill-defined hypodensities in the right frontal white matter are new since the prior study. These are compatible with infarct of indeterminate age, possibly subacute. Well-defined hypodensity left basal ganglia and internal capsule anteriorly compatible with chronic infarct. This was not present previously. Negative for hemorrhage or mass. Vascular: Negative for hyperdense vessel Skull: Negative Sinuses/Orbits: Negative Other: None IMPRESSION: Ill-defined hypodensities right frontal white matter compatible with ischemia of indeterminate age. Possible subacute Chronic infarct left basal ganglia No acute hemorrhage. Electronically Signed   By: Franchot Gallo M.D.   On: 05/08/2021 13:39   MR BRAIN WO CONTRAST  Result Date: 05/08/2021 CLINICAL DATA:  Weakness. EXAM: MRI HEAD WITHOUT CONTRAST TECHNIQUE: Multiplanar, multiecho pulse sequences of the brain and surrounding structures were obtained without intravenous contrast. COMPARISON:  Head CT 05/08/2021 FINDINGS: Brain: There are acute to early subacute infarcts involving white matter in the right frontal lobe primarily at the  level of the corona radiata, and there is also a single subcentimeter acute or early subacute cortical infarct laterally in the right frontal lobe. There is a chronic infarct in the left basal ganglia with associated chronic blood products. There is also a small chronic cortical infarct in the posteroinferior left occipital lobe. Mild cerebral atrophy is within normal limits for age. No mass, midline shift, or extra-axial fluid collection is evident. Vascular: Major intracranial vascular flow voids are preserved. Skull and upper cervical spine: Unremarkable bone marrow signal. Sinuses/Orbits: Unremarkable orbits. Paranasal sinuses and mastoid air cells are clear. Other: None. IMPRESSION: 1. Acute to early subacute right frontal lobe infarcts. 2. 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 Electronically Signed   By: Logan Bores M.D.   On: 05/08/2021 16:58   US Carotid Bilateral (at [facility] and [facility] only)  Result Date: 05/09/2021 CLINICAL DATA:  70 year old female with a history of  CVA EXAM: BILATERAL CAROTID DUPLEX ULTRASOUND TECHNIQUE: Pearline Cables scale imaging, color Doppler and duplex ultrasound were performed of bilateral carotid and vertebral arteries in the neck. COMPARISON:  None. FINDINGS: Criteria: Quantification of carotid stenosis is based on velocity parameters that correlate the residual internal carotid diameter with NASCET-based stenosis levels, using the diameter of the distal internal carotid lumen as the denominator for stenosis measurement. The following velocity measurements were obtained: RIGHT ICA:  Systolic 696 cm/sec, Diastolic 19 cm/sec CCA:  295 cm/sec SYSTOLIC ICA/CCA RATIO:  1.5 ECA:  128 cm/sec LEFT ICA:  Systolic 95 cm/sec, Diastolic 11 cm/sec CCA:  84 cm/sec SYSTOLIC ICA/CCA RATIO:  1.1 ECA:  157 cm/sec Right Brachial SBP: Not acquired Left Brachial SBP: Not acquired RIGHT CAROTID ARTERY: No significant calcified disease of the right common carotid artery. Intermediate waveform  maintained. Heterogeneous plaque without significant calcifications at the right carotid bifurcation. Low resistance waveform of the right ICA. No significant tortuosity. RIGHT VERTEBRAL ARTERY: Antegrade flow with low resistance waveform. LEFT CAROTID ARTERY: No significant calcified disease of the left common carotid artery. Intermediate waveform maintained. Heterogeneous plaque at the left carotid bifurcation without significant calcifications. Low resistance waveform of the left ICA. LEFT VERTEBRAL ARTERY: Left vertebral artery not identified. IMPRESSION: Color duplex indicates moderate heterogeneous plaque with no hemodynamically significant stenosis by duplex criteria in the extracranial cerebrovascular circulation. Left vertebral artery waveform not identified. Signed, Dulcy Fanny. Dellia Nims, RPVI [facility] Radiology Electronically Signed   By: Corrie Mckusick D.O.   On: 05/09/2021 08:19   DG Knee Complete 4 Views Left  Result Date: 05/08/2021 CLINICAL DATA:  Recent fall with left knee pain, initial encounter EXAM: LEFT KNEE - COMPLETE 4+ VIEW COMPARISON:  None. FINDINGS: No evidence of fracture, dislocation, or joint effusion. No evidence of arthropathy or other focal bone abnormality. Soft tissues are unremarkable. IMPRESSION: No acute abnormality noted. Electronically Signed   By: Inez Catalina M.D.   On: 05/08/2021 15:32   ECHOCARDIOGRAM COMPLETE  Result Date: 05/09/2021    ECHOCARDIOGRAM REPORT   Patient Name:   MARIABELEN PRESSLY Austell Date of Exam: 05/09/2021 Medical Rec #:  [id number]        Height:       62.0 in Accession #:    [id number]       Weight:       175.5 lb Date of Birth:  12-04-1950       BSA:          1.808 m Patient Age:    26 years         BP:           179/73 mmHg Patient Gender: F                HR:           73 bpm. Exam Location:  [facility] Procedure: 2D Echo, Cardiac Doppler, Color Doppler and Saline Contrast Bubble            Study  Indications:     Stroke I63.9  History:         Patient has no prior history of Echocardiogram examinations.                  Stroke; Risk Factors:Diabetes.  Sonographer:     Sherrie Sport RDCS (AE) Referring Phys:  [place] Diagnosing Phys: Ida Rogue MD IMPRESSIONS  1. Left ventricular ejection fraction, by estimation, is 60 to 65%. The left  ventricle has normal function. The left ventricle has no regional wall motion abnormalities. Left ventricular diastolic parameters are indeterminate.  2. Right ventricular systolic function is normal. The right ventricular size is normal. FINDINGS  Left Ventricle: Left ventricular ejection fraction, by estimation, is 60 to 65%. The left ventricle has normal function. The left ventricle has no regional wall motion abnormalities. The left ventricular internal cavity size was normal in size. There is  no left ventricular hypertrophy. Left ventricular diastolic parameters are indeterminate. Right Ventricle: The right ventricular size is normal. No increase in right ventricular wall thickness. Right ventricular systolic function is normal. Left Atrium: Left atrial size was normal in size. Right Atrium: Right atrial size was normal in size. Pericardium: There is no evidence of pericardial effusion. Mitral Valve: The mitral valve was not well visualized. No evidence of mitral valve regurgitation. No evidence of mitral valve stenosis. Tricuspid Valve: The tricuspid valve is not well visualized. Tricuspid valve regurgitation is not demonstrated. No evidence of tricuspid stenosis. Aortic Valve: The aortic valve was not well visualized. Aortic valve regurgitation is not visualized. No aortic stenosis is present. Pulmonic Valve: The pulmonic valve was not well visualized. Pulmonic valve regurgitation is not visualized. No evidence of pulmonic stenosis. Aorta: The aortic root is normal in size and structure. Venous: The inferior vena cava is normal in size with greater than 50%  respiratory variability, suggesting right atrial pressure of 3 mmHg. IAS/Shunts: No atrial level shunt detected by color flow Doppler. Agitated saline contrast was given intravenously to evaluate for intracardiac shunting.  LEFT VENTRICLE PLAX 2D LVIDd:         3.70 cm LVIDs:         2.30 cm LV PW:         1.20 cm LV IVS:        1.30 cm LVOT diam:     2.10 cm LVOT Area:     3.46 cm  LEFT ATRIUM         Index LA diam:    3.20 cm 1.77 cm/m                        PULMONIC VALVE AORTA                 PV Vmax:        0.99 m/s Ao Root diam: 3.20 cm PV Peak grad:   3.9 mmHg                       RVOT Peak grad: 5 mmHg   SHUNTS Systemic Diam: 2.10 cm Ida Rogue MD Electronically signed by Ida Rogue MD Signature Date/Time: 05/09/2021/5:06:01 PM    Final     SIGNED: Deatra James, MD, FHM. [facility],  Pager (please use [email or web address] to page/text) Please use Epic Secure Chat for non-urgent communication (7AM-7PM)  If 7PM-7AM, please contact night-coverage [email or web address], 05/12/2021, 12:25 PM

## 2021-05-13 LAB — CBC
HCT: 31.7 % — ABNORMAL LOW (ref 36.0–46.0)
Hemoglobin: 10 g/dL — ABNORMAL LOW (ref 12.0–15.0)
MCH: 25.6 pg — ABNORMAL LOW (ref 26.0–34.0)
MCHC: 31.5 g/dL (ref 30.0–36.0)
MCV: 81.1 fL (ref 80.0–100.0)
Platelets: 303 10*3/uL (ref 150–400)
RBC: 3.91 MIL/uL (ref 3.87–5.11)
RDW: 14.1 % (ref 11.5–15.5)
WBC: 9.4 10*3/uL (ref 4.0–10.5)
nRBC: 0 % (ref 0.0–0.2)

## 2021-05-13 LAB — GLUCOSE, CAPILLARY
Glucose-Capillary: 195 mg/dL — ABNORMAL HIGH (ref 70–99)
Glucose-Capillary: 197 mg/dL — ABNORMAL HIGH (ref 70–99)
Glucose-Capillary: 168 mg/dL — ABNORMAL HIGH (ref 70–99)
Glucose-Capillary: 276 mg/dL — ABNORMAL HIGH (ref 70–99)

## 2021-05-13 LAB — BASIC METABOLIC PANEL
Anion gap: 8 (ref 5–15)
BUN: 14 mg/dL (ref 8–23)
CO2: 25 mmol/L (ref 22–32)
Calcium: 8.6 mg/dL — ABNORMAL LOW (ref 8.9–10.3)
Chloride: 104 mmol/L (ref 98–111)
Creatinine, Ser: 0.78 mg/dL (ref 0.44–1.00)
GFR, Estimated: >60 mL/min (ref >60)
Glucose, Bld: 183 mg/dL — ABNORMAL HIGH (ref 70–99)
Potassium: 3.9 mmol/L (ref 3.5–5.1)
Sodium: 137 mmol/L (ref 135–145)

## 2021-05-13 LAB — MRSA PCR SCREENING: MRSA by PCR: NEGATIVE

## 2021-05-13 MED ORDER — ENOXAPARIN SODIUM 40 MG/0.4ML IJ SOSY
40.0000 mg | PREFILLED_SYRINGE | INTRAMUSCULAR | Status: DC
  Administered 2021-05-13 – 2021-05-14 (×2): 40 mg via SUBCUTANEOUS
  Filled 2021-05-13 (×2): qty 0.4

## 2021-05-13 MED ORDER — LISINOPRIL 5 MG PO TABS
5.0000 mg | ORAL_TABLET | Freq: Every day | ORAL | Status: DC
  Administered 2021-05-13: 5 mg via ORAL
  Filled 2021-05-13: qty 1

## 2021-05-13 NOTE — Progress Notes (Signed)
Inpatient Rehab Admissions:  Inpatient Rehab Consult received.  I met with patient at the bedside for rehabilitation assessment and to discuss goals and expectations of an inpatient rehab admission.  Pt acknowledged understanding of CIR goals and expectations. Pt interested in pursuing CIR. Gave permission to contact daughters, [name] and Cassandria Santee. Spoke with Tabatha on the telephone. She acknowledged understanding of CIR goals and expectations. She is interested in pt pursuing CIR.  She acknowledged that she and her sister are able to provide intermittent supervision/assistance to pt. Will continue to follow.    Signed: Gayland Curry, [place], [place] Admissions Coordinator [phone]

## 2021-05-13 NOTE — PMR Pre-admission (Signed)
PMR Admission Coordinator Pre-Admission Assessment  Patient: Madison Davidson is an 70 y.o., female MRN: [id number] DOB: December 01, 1950 Height: _0  (157.5 cm) Weight: 81.6 kg  Insurance Information HMO:     PPO:      PCP:     IPA:      80/20: yes     OTHER:  PRIMARY: Medicare A & B      Policy#: [id number]      Subscriber: patient CM Name:       Phone#:      Fax#:  Pre-Cert#:       Employer:  Benefits:  Phone #: verified eligibility via [place] on 05/13/21     Name:  Eff. Date: Part A & B effective 11/26/09     Deduct: $1,556      Out of Pocket Max: NA      Life Max: NA CIR: 100% with Medicare approval      SNF: 100% days 1-20, 80% days 21-100 Outpatient: 80%     Co-Pay: 20% Home Health: 100%      Co-Pay:  DME: 80%     Co-Pay: 20% Providers: pt's choice SECONDARY: Medicaid [place] Access     Policy#: [id number]     Phone#: [phone]  Financial Counselor:       Phone#:   The "Data Collection Information Summary" for patients in Inpatient Rehabilitation Facilities with attached "Privacy Act [place] Records" was provided and verbally reviewed with: Patient  Emergency Contact Information Contact Information     Name Relation Home Work Mobile   trinidad,sabrina    [phone]   [facility] A Daughter [phone]         Current Medical History  Patient Admitting Diagnosis: CVA  History of Present Illness: Pt is a 70 year old female with medical hx significant for: DM, HTN, skin CA, CAD, CVA. Pt presented to [facility] ED on 05/08/21 d/t slurred speech. Pt reported that she fell 3 weeks prior, briefly lost consciousness and developed transient left leg numbness. She noted that she has been feeling more weak and tired since the fall.  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 Pt transferred to [facility] d/t severe intracranial atherosclerosis/severe symptomatic right MCA stenosis.   Pt underwent distal M1/MCA segment angioplasty and stenting on 05/11/21. Therapy evaluations complete and CIR recommended d/t pt's deficits in functional mobility, cognition, dysphagia, and the ability to complete ADLs independently.   Complete NIHSS TOTAL: 2  Patient's medical record from [facility] has been reviewed by the rehabilitation admission coordinator and physician.  Past Medical History  Past Medical History:  Diagnosis Date   Cancer ([place])    skin   Coronary artery disease    Diabetes mellitus without complication ([place])    Hypertension    Stroke [facility])     Family History   family history includes Heart disease in her father and mother.  Prior Rehab/Hospitalizations Has the patient had prior rehab or hospitalizations prior to admission? No  Has the patient had major surgery during 100 days prior to admission? Yes   Current Medications  Current Facility-Administered Medications:    acetaminophen (TYLENOL) tablet 650 mg, 650 mg, Oral, Q6H PRN, Kayleen Memos, DO   aspirin EC tablet 81 mg, 81 mg, Oral, Daily, Irene Pap N, DO, 81 mg at 05/15/21 0921   atorvastatin (LIPITOR) tablet 80 mg, 80 mg, Oral, QHS, Hall, Carole N, DO, 80 mg  at 05/14/21 2114   Chlorhexidine Gluconate Cloth 2 % PADS 6 each, 6 each, Topical, Daily, Rosalin Hawking, MD, 6 each at 05/15/21 0917   clevidipine (CLEVIPREX) infusion 0.5 mg/mL, 0-21 mg/hr, Intravenous, Continuous, de Sindy Messing, [place], MD, Stopped at 05/11/21 1630   clopidogrel (PLAVIX) tablet 75 mg, 75 mg, Oral, Daily, Hall, Carole N, DO, 75 mg at 05/15/21 0921   enoxaparin (LOVENOX) injection 40 mg, 40 mg, Subcutaneous, Q24H, Shahmehdi, Seyed A, MD, 40 mg at 05/14/21 1421   insulin aspart (novoLOG) injection 0-15 Units, 0-15 Units, Subcutaneous, TID WC, Shahmehdi, Seyed A, MD, 3 Units at 05/15/21 0921   insulin aspart (novoLOG) injection 0-5 Units, 0-5 Units, Subcutaneous, QHS, Shahmehdi, Seyed A, MD, 3 Units at 05/13/21 2110    levothyroxine (SYNTHROID) tablet 125 mcg, 125 mcg, Oral, Q0600, Kayleen Memos, DO, 125 mcg at 05/15/21 0509   lisinopril (ZESTRIL) tablet 10 mg, 10 mg, Oral, Daily, Shahmehdi, Seyed A, MD, 10 mg at 05/15/21 1638   melatonin tablet 3 mg, 3 mg, Oral, QHS PRN, Irene Pap N, DO, 3 mg at 05/13/21 2106   ondansetron (ZOFRAN) injection 4 mg, 4 mg, Intravenous, Q6H PRN, de Sindy Messing, Erven Colla, MD  Patients Current Diet:  Diet Order             Diet - low sodium heart healthy           DIET DYS 2 Room service appropriate? Yes; Fluid consistency: Thin  Diet effective now                   Precautions / Restrictions Precautions Precautions: Fall Precaution Comments: pocketing food L chk Restrictions Weight Bearing Restrictions: No   Has the patient had 2 or more falls or a fall with injury in the past year? Yes  Prior Activity Level Community (5-7x/wk): drives; gets out of house 5 days/week  Prior Functional Level Self Care: Did the patient need help bathing, dressing, using the toilet or eating? Independent  Indoor Mobility: Did the patient need assistance with walking from room to room (with or without device)? Independent  Stairs: Did the patient need assistance with internal or external stairs (with or without device)? Independent  Functional Cognition: Did the patient need help planning regular tasks such as shopping or remembering to take medications? Independent  Home Assistive Devices / Equipment Home Assistive Devices/Equipment: None Home Equipment: Cane - single point, Grab bars - toilet, Grab bars - tub/shower  Prior Device Use: Indicate devices/aids used by the patient prior to current illness, exacerbation or injury? None of the above  Current Functional Level Cognition  Arousal/Alertness: Awake/alert Overall Cognitive Status: Impaired/Different from baseline Current Attention Level: Sustained Orientation Level: Oriented X4 Following Commands: Follows  one step commands with increased time Safety/Judgement: Decreased awareness of safety, Decreased awareness of deficits General Comments: A&Ox4. Inattention to L side noted during functional tasks, needing repeated cues for L UE placement on RW. Pt with very slow processing and poor safety awareness needing repeated cues to keep RW proximal to her and not leave it behind. Poor attention span. flat affect throughout, intermittently speaking with very soft voice. Slight impulsivity. Attention: Focused, Sustained Focused Attention: Appears intact Sustained Attention: Impaired Sustained Attention Impairment: Verbal basic, Functional basic Memory: Impaired Memory Impairment: Storage deficit Awareness: Impaired Awareness Impairment: Intellectual impairment, Emergent impairment, Anticipatory impairment Problem Solving: Impaired Problem Solving Impairment: Verbal basic, Functional basic Executive Function: Reasoning, Sequencing, Self Monitoring, Self Correcting, Initiating, Decision Making Reasoning: Impaired Sequencing: Impaired Decision  Making: Impaired Initiating: Impaired Self Monitoring: Impaired Self Correcting: Impaired    Extremity Assessment (includes Sensation/Coordination)  Upper Extremity Assessment: Defer to OT evaluation LUE Deficits / Details: AROM and strength WFL for ADL; note incoordination LUE Sensation: decreased proprioception ("feels likel it's heavy adn trying to fall asleep") LUE Coordination: decreased fine motor  Lower Extremity Assessment: Generalized weakness, LLE deficits/detail LLE Deficits / Details: Noted decreased strength compared to R with functional mobility, MMT of grossly 4- to 4 compared to grossly 4 on R; 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; intact sensation to light touch LLE Sensation: decreased proprioception (noted with mobility; able to detect location of light touch with precision with eyes closed, denies numbness/tingling) LLE Coordination: decreased  fine motor, decreased gross motor (dysdiadochokinesia noted)    ADLs  Overall ADL's : Needs assistance/impaired Eating/Feeding: Supervision/ safety, Set up Eating/Feeding Details (indicate cue type and reason): note pocketing of food L side of mouth; cues to clear Grooming: Supervision/safety, Set up, Standing Grooming Details (indicate cue type and reason): required cues to find soap on L wall Upper Body Bathing: Set up, Supervision/ safety, Sitting Lower Body Bathing: Sit to/from stand, Moderate assistance Lower Body Bathing Details (indicate cue type and reason): mod redirectonal cues with any distraction Upper Body Dressing : Minimal assistance Lower Body Dressing: Moderate assistance, Sit to/from stand Toilet Transfer: Minimal assistance, Ambulation, Grab bars Toileting- Clothing Manipulation and Hygiene: Min guard, Sit to/from stand Functional mobility during ADLs: Minimal assistance, Cueing for safety General ADL Comments: very slow; easily distracted    Mobility  Overal bed mobility: Needs Assistance Bed Mobility: Supine to Sit, Sit to Supine Supine to sit: Min guard, HOB elevated Sit to supine: Mod assist General bed mobility comments: Extra time and min guard assist to transition to sit EOB with HOB elevated and utilization of rails. Cues provided to manage legs off bed. ModA to lift legs back into bed with return to supine.    Transfers  Overall transfer level: Needs assistance Equipment used: Rolling walker (2 wheeled) Transfers: Sit to/from Stand Sit to Stand: Min assist General transfer comment: Sit <> stand 1x from EOB and 2x from commode > RW, minA to power up and steady, requiring extra time and cues to transition hands to proper locations on RW (particularly on her L). Needs cues to keep RW proximal to her when preparing to transfer to sit as she tends to push it to the side away from her.    Ambulation / Gait / Stairs / Wheelchair Mobility   Ambulation/Gait Ambulation/Gait assistance: Min assist, Mod assist Gait Distance (Feet): 20 Feet (x2 bouts of ~20 ft each) Assistive device: Rolling walker (2 wheeled) Gait Pattern/deviations: Step-through pattern, Decreased stride length, Decreased dorsiflexion - right, Decreased dorsiflexion - left, Trunk flexed General Gait Details: Pt with slow, unsteady gait, taking small bil steps while maintaining a trunk flexed posture. Min-modA to maintain her balance, particularly when she impulsively pushes her RW away from her when approaching the sink or a place to sit. Gait velocity: decreased Gait velocity interpretation: <1.31 ft/sec, indicative of household ambulator    Posture / Balance Dynamic Sitting Balance Sitting balance - Comments: Static sitting EOB no LOB, min guard for safety. Balance Overall balance assessment: Needs assistance Sitting-balance support: Feet supported, No upper extremity supported Sitting balance-Leahy Scale: Fair Sitting balance - Comments: Static sitting EOB no LOB, min guard for safety. Standing balance support: Bilateral upper extremity supported, Single extremity supported, During functional activity Standing balance-Leahy Scale: Poor Standing balance  comment: Reliant on 1-2 UE support and min-modA.    Special needs/care consideration Diabetic management novoLOG: 0-5 units daily at bedtime; novoLOG: 0-15 units 3 times daily at meals and Designated visitor [place], daughter; Ila Mcgill, daughter   Previous Home Environment (from acute therapy documentation) Living Arrangements: Alone  Lives With: Alone Available Help at Discharge: Family, Available PRN/intermittently Type of Home: Apartment Home Layout: One level Home Access: Stairs to enter Entrance Stairs-Rails: Right, Left Entrance Stairs-Number of Steps: 1 Bathroom Shower/Tub: Optometrist: No Home Care Services: No  Discharge  Living Setting Plans for Discharge Living Setting: Patient's home Type of Home at Discharge: Apartment Discharge Home Layout: One level Discharge Home Access: Stairs to enter Entrance Stairs-Rails: Right, Left Entrance Stairs-Number of Steps: 1 Discharge Bathroom Shower/Tub: Tub/shower unit Discharge Bathroom Toilet: Standard Discharge Bathroom Accessibility: No Does the patient have any problems obtaining your medications?: No  Social/Family/Support Systems Anticipated Caregiver: [place] Ila Mcgill, daughters Anticipated Caregiver's Contact Information: [name]: [phone], Tabatha:[phone] Caregiver Availability: Intermittent Discharge Plan Discussed with Primary Caregiver: Yes Is Caregiver In Agreement with Plan?: Yes Does Caregiver/Family have Issues with Lodging/Transportation while Pt is in Rehab?: No  Goals Patient/Family Goal for Rehab: Supervision-Mod I: PT/OT/ST Expected length of stay: 12-14 days Pt/Family Agrees to Admission and willing to participate: Yes Program Orientation Provided & Reviewed with Pt/Caregiver Including Roles  & Responsibilities: Yes  Decrease burden of Care through IP rehab admission: NA  Possible need for SNF placement upon discharge: Not anticipated  Patient Condition: I have reviewed medical records from [facility], spoken with CM, and patient and daughter. I met with patient at the bedside and discussed via phone for inpatient rehabilitation assessment.  Patient will benefit from ongoing PT, OT, and SLP, can actively participate in 3 hours of therapy a day 5 days of the week, and can make measurable gains during the admission.  Patient will also benefit from the coordinated team approach during an Inpatient Acute Rehabilitation admission.  The patient will receive intensive therapy as well as Rehabilitation physician, nursing, social worker, and care management interventions.  Due to safety, disease management,  medication administration, and patient education the patient requires 24 hour a day rehabilitation nursing.  The patient is currently Min G-Mod A with mobility and Set up-Mod A with basic ADLs.  Discharge setting and therapy post discharge at home with home health is anticipated.  Patient has agreed to participate in the Acute Inpatient Rehabilitation Program and will admit today.  Preadmission Screen Completed By:  Bethel Born, 05/15/2021 10:14 AM ______________________________________________________________________   Discussed status with Dr. Ranell Patrick on 05/15/21  at 10:14 AM and received approval for admission today.  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 Yes Co-Morbidities requiring supervision/potential complications: HTN, HLD, DM2, obesity (BMI 32.90), left leg numbness Due to bladder management, bowel management, safety, skin/wound care, disease management, medication administration, pain management, and patient education, does the patient require 24 hr/day rehab nursing? Yes Does the patient require coordinated care of a physician, rehab nurse, PT, OT, and SLP to address physical and functional deficits in the context of the above medical diagnosis(es)? Yes Addressing  deficits in the following areas: balance, endurance, locomotion, strength, transferring, bowel/bladder control, bathing, dressing, feeding, grooming, toileting, cognition, speech, and psychosocial support Can the patient actively participate in an intensive therapy program of at least 3 hrs of therapy 5 days a week? Yes The potential for patient to make measurable gains while on  inpatient rehab is excellent Anticipated functional outcomes upon discharge from inpatient rehab: supervision PT, supervision OT, supervision SLP Estimated rehab length of stay to reach the above functional goals is: 7-8 days Anticipated discharge destination: Home 10. Overall Rehab/Functional Prognosis: excellent   MD Signature: Leeroy Cha, MD

## 2021-05-13 NOTE — Plan of Care (Signed)
  Problem: Clinical Measurements: Goal: Ability to maintain clinical measurements within normal limits will improve Outcome: Progressing Goal: Will remain free from infection Outcome: Progressing Goal: Diagnostic test results will improve Outcome: Progressing Goal: Respiratory complications will improve Outcome: Progressing Goal: Cardiovascular complication will be avoided Outcome: Progressing   Problem: Activity: Goal: Risk for activity intolerance will decrease Outcome: Progressing   Problem: Nutrition: Goal: Adequate nutrition will be maintained Outcome: Progressing   Problem: Elimination: Goal: Will not experience complications related to urinary retention Outcome: Progressing   Problem: Skin Integrity: Goal: Risk for impaired skin integrity will decrease Outcome: Progressing   Problem: Self-Care: Goal: Ability to communicate needs accurately will improve Outcome: Progressing   Problem: Ischemic Stroke/TIA Tissue Perfusion: Goal: Complications of ischemic stroke/TIA will be minimized Outcome: Progressing   Problem: Coping: Goal: Level of anxiety will decrease Outcome: Completed/Met   Problem: Self-Care: Goal: Ability to participate in self-care as condition permits will improve Outcome: Completed/Met

## 2021-05-13 NOTE — Progress Notes (Signed)
PROGRESS NOTE    Patient: Madison Davidson                            [facility]P: [facility]                    DOB: [birth year]            DOA: 05/11/2021 GNF:[id number]             DOS: 05/13/2021, 11:46 AM   LOS: 2 days   Date of Service: The patient was seen and examined on 05/13/2021  Subjective:   The patient was seen and examined this morning, awake alert oriented, no significant changes in speeches, no new focal neurological findings  Status post right MCA stent placement on 05/11/2021 by interventional radiologist, positive tolerated procedure well,  Mildly hypertensive otherwise hemodynamically stable  Brief Narrative:   Madison Davidson is a 70 y.o. female with medical history significant for type 2 diabetes, hypertension, hyperlipidemia, hypothyroidism, obesity, coronary artery disease, prior CVA, who initially presented to [facility] ED on 05/08/21 due to transient slurring of speech.  She was admitted for stroke work-up.  Work-up revealed severe symptomatic right MCA stenosis.  At [facility] she was followed by neurology who recommended transfer to [facility] for urgent but not emergent intervention, possible intracranial stenting of the right MCA origin and bifurcation.  Patient was seen and examined in her room at [facility] 3W unit.  She has no new complaints at the time of this visit.  States she is sleepy.  Her speech appears improved.   ED Course:  Direct transfer from [facility] to [facility].    Assessment & Plan:   Active Problems:   Intracranial atherosclerosis   Cerebral thrombosis with cerebral infarction     Intracranial atherosclerosis/ Severe symptomatic right MCA stenosis -Currently stable, no further new focal neurological findings  -05/11/2021 status post IR stenting right distal M1 MCA -tolerated procedure well  Presented with transient slurring of speech at [facility] -No progression of symptoms, no new focal neurological findings Work-up revealed severe symptomatic right  MCA stenosis Seen by neurology at [facility] with recommendation for transfer to [facility] for possible intracranial stenting of the right MCA origin and bifurcation.  -Neurology consulted Dr. Erlinda Hong Per neurology, DAPT aspirin 81 mg daily and Plavix 75 mg daily, continue Lipitor 80 mg daily. on aspirin 81 mg daily and clopidogrel 75 mg daily DAPT for 3 months and then ASA alone given multifocal intracranial stenosis.... Per neurology Patient may need a PCSK9 inhibitor outpatient, LDL not at goal. Allow permissive hypertension up to 220/120.   Hyperlipidemia LDL 119 on 05/09/2021 Goal LDL less than 70. Continue high intensity statin Lipitor 80 mg daily. May need a PCSK9 inhibitor as stated above. -Recommending outpatient lipid allergist evaluation -Stable  Type 2 diabetes with hyperglycemia Hemoglobin A1c 7.1 on 05/09/2021 Goal A1c less than 7.0 Insulin sliding scale Remained stable   Hypothyroidism Resume home levothyroxine -Stable   Chronic anxiety/depression Resume home regimen. -Stable  Obesity BMI 32 Recommend weight loss outpatient with regular physical activity and healthy dieting.   Severe generalized weakness, debility, dysarthric -PT OT consulted, evaluation completed recommending CIR-rehab -Pending patient approval    DVT prophylaxis: SCDs.  Code Status: Full code.  Family Communication: None at bedside.  Disposition Plan: P resented from home, anticipating inpatient rehab placement symptoms improved   Consults called: IR, please consult neurology in the morning.  Admission status: Inpatient status.  Patient will require at least 2 midnights for further evaluation and treatment of present condition.     Status is: Inpatient       Dispo:             Patient From: Home            Planned Disposition: Home, possibly on 05/15/2021  to CIR            Medically stable for discharge: No                       ----------------------------------------------------------------------------------------------------------------------------------------------- Nutritional status:  The patient's BMI is: Body mass index is 32.86 kg/m. I agree with the assessment and plan as outlined below: Nutrition Status:    ---------------------------------------------------------------------------------------------------------------------------------------------------- Cultures; none   Antimicrobials: None    Consultants:  Neurology/interventional radiology   ------------------------------------------------------------------------------------------------------------------------------------------------  Dispo:  Patient From: Home   Level of care: Telemetry Medical   Procedures:     Antimicrobials:  Anti-infectives (From admission, onward)    None        Medication:   aspirin EC  81 mg Oral Daily   atorvastatin  80 mg Oral QHS   Chlorhexidine Gluconate Cloth  6 each Topical Daily   clopidogrel  75 mg Oral Daily   insulin aspart  0-15 Units Subcutaneous TID WC   insulin aspart  0-5 Units Subcutaneous QHS   levothyroxine  125 mcg Oral Q0600   lisinopril  5 mg Oral Daily    acetaminophen, melatonin, ondansetron (ZOFRAN) IV   Objective:   Vitals:   05/13/21 0800 05/13/21 0900 05/13/21 1000 05/13/21 1028  BP: (!) 169/68   (!) 156/48  Pulse: 76 73 71   Resp: _0 Temp: 98.3 F (36.8 C)     TempSrc: Oral     SpO2: 94% 96% 93%   Weight:      Height:        Intake/Output Summary (Last 24 hours) at 05/13/2021 1146 Last data filed at 05/13/2021 0647 Gross per 24 hour  Intake 720 ml  Output 1500 ml  Net -780 ml   Filed Weights   05/11/21 0046  Weight: 81.5 kg     Examination:      Physical Exam:   General:  Alert, oriented, cooperative, no distress; dysarthric --no changes   HEENT:  Normocephalic, PERRL, otherwise with in Normal limits   Neuro:   CNII-XII intact. , normal motor and sensation, reflexes intact   Lungs:   Clear to auscultation BL, Respirations unlabored, no wheezes / crackles  Cardio:    S1/S2, RRR, No murmure, No Rubs or Gallops   Abdomen:   Soft, non-tender, bowel sounds active all four quadrants,  no guarding or peritoneal signs.  Muscular skeletal:  Severe generalized, symmetric weaknesses  Limited exam - in bed, able to move all 4 extremities, ,  2+ pulses,  symmetric, No pitting edema  Skin:  Dry, warm to touch, negative for any Rashes,  Wounds: Please see nursing documentation            ------------------------------------------------------------------------------------------------------------------------------------------    LABs:  CBC Latest Ref Rng & Units 05/13/2021 05/12/2021 05/11/2021  WBC 4.0 - 10.5 K/uL 9.4 8.8 9.4  Hemoglobin 12.0 - 15.0 g/dL 10.0(L) 10.1(L) 12.0  Hematocrit 36.0 - 46.0 % 31.7(L) 31.9(L) 38.3  Platelets 150 - 400 K/uL 303 305 340   CMP Latest Ref Rng & Units 05/13/2021 05/12/2021 05/11/2021  Glucose  70 - 99 mg/dL 183(H) 144(H) 157(H)  BUN 8 - 23 mg/dL _0 Creatinine 0.44 - 1.00 mg/dL 0.78 0.84 0.82  Sodium 135 - 145 mmol/L 137 136 137  Potassium 3.5 - 5.1 mmol/L 3.9 4.1 3.9  Chloride 98 - 111 mmol/L 104 102 103  CO2 22 - 32 mmol/L _1 Calcium 8.9 - 10.3 mg/dL 8.6(L) 8.8(L) 9.0       Micro Results Recent Results (from the past 240 hour(s))  Resp Panel by RT-PCR (Flu A&B, Covid) Nasopharyngeal Swab     Status: None   Collection Time: 05/08/21  2:19 PM   Specimen: Nasopharyngeal Swab; Nasopharyngeal(NP) swabs in vial transport medium  Result Value Ref Range Status   SARS Coronavirus 2 by RT PCR NEGATIVE NEGATIVE Final    Comment: (NOTE) SARS-CoV-2 target nucleic acids are NOT DETECTED.  The SARS-CoV-2 RNA is generally detectable in upper respiratory specimens during the acute phase of infection. The lowest concentration of SARS-CoV-2 viral copies this  assay can detect is 138 copies/mL. A negative result does not preclude SARS-Cov-2 infection and should not be used as the sole basis for treatment or other patient management decisions. A negative result may occur with  improper specimen collection/handling, submission of specimen other than nasopharyngeal swab, presence of viral mutation(s) within the areas targeted by this assay, and inadequate number of viral copies(<138 copies/mL). A negative result must be combined with clinical observations, patient history, and epidemiological information. The expected result is Negative.  Fact Sheet for Patients:  EntrepreneurPulse.com.au  Fact Sheet for Healthcare Providers:  IncredibleEmployment.be  This test is no t yet approved or cleared by the [place] FDA and  has been authorized for detection and/or diagnosis of SARS-CoV-2 by FDA under an Emergency Use Authorization (EUA). This EUA will remain  in effect (meaning this test can be used) for the duration of the COVID-19 declaration under Section 564(b)(1) of the Act, 21 U.S.C.section 360bbb-3(b)(1), unless the authorization is terminated  or revoked sooner.       Influenza A by PCR NEGATIVE NEGATIVE Final   Influenza B by PCR NEGATIVE NEGATIVE Final    Comment: (NOTE) The Xpert Xpress SARS-CoV-2/FLU/RSV plus assay is intended as an aid in the diagnosis of influenza from Nasopharyngeal swab specimens and should not be used as a sole basis for treatment. Nasal washings and aspirates are unacceptable for Xpert Xpress SARS-CoV-2/FLU/RSV testing.  Fact Sheet for Patients: EntrepreneurPulse.com.au  Fact Sheet for Healthcare Providers: IncredibleEmployment.be  This test is not yet approved or cleared by the [place] FDA and has been authorized for detection and/or diagnosis of SARS-CoV-2 by FDA under an Emergency Use Authorization (EUA). This EUA will  remain in effect (meaning this test can be used) for the duration of the COVID-19 declaration under Section 564(b)(1) of the Act, 21 U.S.C. section 360bbb-3(b)(1), unless the authorization is terminated or revoked.  Performed at [facility], [address] [place], [place] [address]     Radiology Reports CT ANGIO HEAD W OR WO CONTRAST  Result Date: 05/09/2021 CLINICAL DATA:  Acute stroke on MRI brain EXAM: CT ANGIOGRAPHY HEAD TECHNIQUE: Multidetector CT imaging of the head was performed using the standard protocol during bolus administration of intravenous contrast. Multiplanar CT image reconstructions and MIPs were obtained to evaluate the vascular anatomy. CONTRAST:  67m OMNIPAQUE IOHEXOL 350 MG/ML SOLN COMPARISON:  CT 05/08/2021, correlation made with MRI brain 05/08/2021 FINDINGS: CT HEAD Brain: Evolving recent infarction is again identified within  the right corona radiata. Small area of right frontal cortical involvement on the MRI is not visible by CT. There is no acute intracranial hemorrhage. No significant mass effect. Chronic infarct of the left basal ganglia and adjacent white matter with ex vacuo dilatation of the adjacent lateral ventricle. No hydrocephalus. No extra-axial collection. Vascular: There is atherosclerotic calcification at the skull base. Skull: Calvarium is unremarkable. Sinuses/Orbits: No acute finding. Other: None. CTA HEAD Anterior circulation: Intracranial internal carotid arteries are patent with calcified plaque but no significant stenosis. Anterior cerebral arteries are patent. There is eccentric plaque at the origin of the right MCA with marked stenosis. High-grade stenosis is present at the right MCA bifurcation. Left MCA is patent. There is moderate stenosis of an anterior division M2 branch. Posterior circulation: Dominant intracranial right vertebral artery is patent. Proximal intracranial left vertebral artery is irregular with diminished  enhancement. There is improved opacification more distally beginning at the PICA origin, which may reflect retrograde flow. Basilar artery is patent. Major cerebellar artery origins are patent. A left posterior communicating artery is present. There is irregularity of the left P2 PCA with moderate to marked stenosis. Venous sinuses: As permitted by contrast timing, patent. Review of the MIP images confirms the above findings. IMPRESSION: Evolving recent infarction of the right corona radiata. No acute intracranial hemorrhage. 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 Moderate left M2 MCA branch stenosis. Diffusely irregular left vertebral artery with diminished flow. There is improvement beginning at the PICA origin likely from retrograde flow. Irregular left P2 PCA with moderate to marked stenosis. Electronically Signed   By: Macy Mis M.D.   On: 05/09/2021 14:35   CT Head Wo Contrast  Result Date: 05/08/2021 CLINICAL DATA:  TIA.  Weakness and left knee pain EXAM: CT HEAD WITHOUT CONTRAST TECHNIQUE: Contiguous axial images were obtained from the base of the skull through the vertex without intravenous contrast. COMPARISON:  CT head 09/13/2017 FINDINGS: Brain: Ventricle size and cerebral volume normal for age. Ill-defined hypodensities in the right frontal white matter are new since the prior study. These are compatible with infarct of indeterminate age, possibly subacute. Well-defined hypodensity left basal ganglia and internal capsule anteriorly compatible with chronic infarct. This was not present previously. Negative for hemorrhage or mass. Vascular: Negative for hyperdense vessel Skull: Negative Sinuses/Orbits: Negative Other: None IMPRESSION: Ill-defined hypodensities right frontal white matter compatible with ischemia of indeterminate age. Possible subacute Chronic infarct left basal ganglia No acute hemorrhage. Electronically Signed   By: Franchot Gallo M.D.   On: 05/08/2021 13:39    MR BRAIN WO CONTRAST  Result Date: 05/08/2021 CLINICAL DATA:  Weakness. EXAM: MRI HEAD WITHOUT CONTRAST TECHNIQUE: Multiplanar, multiecho pulse sequences of the brain and surrounding structures were obtained without intravenous contrast. COMPARISON:  Head CT 05/08/2021 FINDINGS: Brain: There are acute to early subacute infarcts involving white matter in the right frontal lobe primarily at the level of the corona radiata, and there is also a single subcentimeter acute or early subacute cortical infarct laterally in the right frontal lobe. There is a chronic infarct in the left basal ganglia with associated chronic blood products. There is also a small chronic cortical infarct in the posteroinferior left occipital lobe. Mild cerebral atrophy is within normal limits for age. No mass, midline shift, or extra-axial fluid collection is evident. Vascular: Major intracranial vascular flow voids are preserved. Skull and upper cervical spine: Unremarkable bone marrow signal. Sinuses/Orbits: Unremarkable orbits. Paranasal sinuses and mastoid air cells are clear.  Other: None. IMPRESSION: 1. Acute to early subacute right frontal lobe infarcts. 2. 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 Electronically Signed   By: Logan Bores M.D.   On: 05/08/2021 16:58   IR Angiogram Extremity Left  Result Date: 05/12/2021 INDICATION: Madison Davidson is a 70 y.o. female with a past medical history significant for anemia, DM, HLD, CAD, HTN and prior CVA who presented to [facility] ED via EMS on 6/13 with complaints of weakness and slurred speech the day prior which had since resolved, per her daughter she had not been taking her medications as prescribed. She was noted to be hypertensive at 171/60 mmHg. Her neurologic exam was benign while in bed, however, patient presented left-sided paresthesia with ambulation. MRI of the brain showed acute to early subacute right frontal lobe infarcts as well as chronic left basal ganglia  and left occipital infarcts. CTA head showed evolving recent infarction of the right corona radiata without acute intracranial hemorrhage as well as marked stenoses of the right MCA origin and bifurcation, moderate left M2 MCA branch stenosis, diffusely irregular left vertebral artery with diminished flow and irregular left P2 PCA with moderate to marked stenosis. She has been transferred to [facility] for planned right MCA angioplasty/stent placement. EXAM: FLUOROSCOPY GUIDED VASCULAR ACCESS DIAGNOSTIC CEREBRAL ANGIOGRAM INTRACRANIAL ANGIOPLASTY AND STENTING, RIGHT MCA FLAT PANEL HEAD CT COMPARISON:  CT/CT angiogram of the head May 09, 2021 MEDICATIONS: Refer to anesthesia documentation. ANESTHESIA/SEDATION: The procedure was performed under general anesthesia CONTRAST:  120 mL of Omnipaque 240 milligram/mL FLUOROSCOPY TIME:  Fluoroscopy Time: 75 minutes 6 seconds (1,935 mGy). COMPLICATIONS: None immediate. TECHNIQUE: Informed written consent was obtained from the patient after a thorough discussion of the procedural risks, benefits and alternatives. All questions were addressed. Maximal Sterile Barrier Technique was utilized including caps, mask, sterile gowns, sterile gloves, sterile drape, hand hygiene and skin antiseptic. A timeout was performed prior to the initiation of the procedure. Using the modified Seldinger technique and a micropuncture kit, access was gained to the distal right radial artery at the anatomical snuffbox and a 6 French sheath was placed. Real-time ultrasound guidance was utilized for vascular access including the acquisition of a permanent ultrasound image documenting patency of the accessed vessel. Slow intra arterial infusion of 5,000 IU heparin, 5 mg Verapamil and 382 mcg nitroglicerin diluted in patient's own blood was performed. No significant fluctuation in patient's blood pressure seen. Then, a right radial artery roadmap was obtained via sheath side port. Normal brachial artery  branching pattern seen. No significant anatomical variation. The right radial artery caliber is adequate for vascular access. Next, a 5 [place] Simmons 2 glide catheter was navigated over a 0.035" Terumo Glidewire into the right subclavian artery under fluoroscopic guidance. Frontal angiogram of the neck was obtained. The catheter was advanced into the aortic arch. The catheter tip was reformed. The catheter was then advanced into the left subclavian artery. Frontal angiogram of the neck was obtained. The catheter was subsequently placed into the left common carotid artery. Frontal and lateral angiograms of the neck were obtained. Under biplane roadmap, the catheter was then advanced into the left internal carotid artery. Frontal and lateral angiograms of the head were obtained. The catheter was again placed into the right subclavian artery. Frontal and lateral angiograms of the head were obtained. The the catheter was then placed clear into the right common carotid artery. Frontal and lateral angiograms of the neck were obtained. Under biplane roadmap, the the catheter was then  advanced into the right internal carotid artery. Frontal and lateral angiograms of the head were obtained. FINDINGS: 1. Normal course and caliber of the cervical right vertebral artery. 2. Left subclavian artery angiograms showed no evidence of opacification of the left vertebral artery, likely related to occlusion. 3. Mild atherosclerotic changes of the left carotid bifurcation without hemodynamically significant stenosis. 4. Severe stenosis of the P2 segment of the left fetal PCA. 5. Mild stenosis of a left M3/MCA posterior division branch. 6. No significant atherosclerotic changes or stenosis of the right carotid bifurcation. 7. Atherosclerotic changes of the right carotid siphons with mild stenosis at the paraclinoid segment. 8. Approximately 50% stenosis at the origin of the right M1/MCA segment. 9. Approximately 50% stenosis at the  origin of a right inferior temporal branch. 10. Severe, hairline stenosis of the distal right M1/MCA segment, involving the origin of both anterior and posterior division branches. There is significant delay in opacification of the anterior division branches. PROCEDURE: This into catheter was exchanged over the wire and under biplane roadmap for a benchmark guide catheter which was placed in the distal cervical segment of the right ICA. Magnified frontal and lateral views of the head were obtained in the working projections. Then, attempted navigation of a 1.25 x 10 mm sapphire balloon over a 0.014 inch Aristotle microguidewire proved unsuccessful due to siphon tortuosity. A headway duo microcatheter was navigated over the Aristotle with a wire into the right M2/MCA posterior division branch, distal to the stenosis. The wire was exchanged for a zoom 14 exchange micro guidewire. The microcatheter was then exchanged for the 1.25 x 10 mm sapphire balloon. Angioplasty was performed under fluoroscopy at the distal right M1/MCA. Right internal carotid artery angiograms with frontal lateral views showed in proved flow in the distal right MCA branches. Attempted navigation of a 2 mm resolute onyx balloon mounted stent into the right M1/MCA proved unsuccessful due to siphon tortuosity. Then, a 2 x 8 mm apex balloon was navigated to the level of stenosis. Additional angioplasty was performed under fluoroscopy. Right internal carotid angiograms with magnified frontal and lateral views showed additional improvement of the degree of stenosis. The headway duo microcatheter was again navigated over the wire into the right M2/MCA posterior division branch. Subsequently, a 3 x 24 mm neuroform atlas stent was deployed in the distal right M1/MCA segment extending to the proximal M2 posterior division branch. Follow-up angiogram showed adequate stent positioning across the stenosis with significant improvement of the degree of stenosis  and anterograde flow in the posterior division branch. Persistent slow flow in the anterior division branch noted. Attempted navigation of the headway duo over the 14 wire through the stent proved unsuccessful. Attempted navigation of the apex balloon also proved unsuccessful. The 2 mm apex balloon was then navigated into the proximal right M1/MCA. Angioplasty was performed under fluoroscopy. Follow-up angiograms showed mild improvement of the proximal right M1/MCA stenosis (now approximately 35%). Further attempts to navigate the balloon for the microcatheter through the previously deployed stent proved unsuccessful. The catheter was subsequently withdrawn. Follow-up angiograms with frontal and lateral views showed stable position and opacification of the stent. Delayed opacification of the anterior division branches noted, although improved from baseline. Flat panel CT of the head was obtained and post processed in a separate workstation with concurrent attending physician supervision. Selected images were sent to PACS. No evidence of hemorrhagic complication. IMPRESSION: 1. Severe intracranial atherosclerotic disease with severe hairline stenosis of the distal right M1/MCA segment extending into the  origins of the superior and inferior divisions and severe stenosis of the left P2/PCA. Mild (50%) stenosis of the proximal right M1/MCA and at the origin of the anterior temporal branch. 2. Angioplasty and stenting performed for treatment of the area of severe stenosis at the distal right M1/MCA segment with improvement of the anterograde flow. Persistent delayed, although improved, flow to the anterior division branch. No further angioplasty or stenting performed due to lack of access through the recently deployed stent. 3. Angioplasty performed at the proximal right M1/MCA segment with approximately 35% residual stenosis. PLAN: Continue on dual antiplatelet therapy for 6 month when stent patency should be evaluated.  Electronically Signed   By: Pedro Earls M.D.   On: 05/12/2021 15:22   US Carotid Bilateral (at [facility] and [facility] only)  Result Date: 05/09/2021 CLINICAL DATA:  70 year old female with a history of CVA EXAM: BILATERAL CAROTID DUPLEX ULTRASOUND TECHNIQUE: Pearline Cables scale imaging, color Doppler and duplex ultrasound were performed of bilateral carotid and vertebral arteries in the neck. COMPARISON:  None. FINDINGS: Criteria: Quantification of carotid stenosis is based on velocity parameters that correlate the residual internal carotid diameter with NASCET-based stenosis levels, using the diameter of the distal internal carotid lumen as the denominator for stenosis measurement. The following velocity measurements were obtained: RIGHT ICA:  Systolic 622 cm/sec, Diastolic 19 cm/sec CCA:  633 cm/sec SYSTOLIC ICA/CCA RATIO:  1.5 ECA:  128 cm/sec LEFT ICA:  Systolic 95 cm/sec, Diastolic 11 cm/sec CCA:  84 cm/sec SYSTOLIC ICA/CCA RATIO:  1.1 ECA:  157 cm/sec Right Brachial SBP: Not acquired Left Brachial SBP: Not acquired RIGHT CAROTID ARTERY: No significant calcified disease of the right common carotid artery. Intermediate waveform maintained. Heterogeneous plaque without significant calcifications at the right carotid bifurcation. Low resistance waveform of the right ICA. No significant tortuosity. RIGHT VERTEBRAL ARTERY: Antegrade flow with low resistance waveform. LEFT CAROTID ARTERY: No significant calcified disease of the left common carotid artery. Intermediate waveform maintained. Heterogeneous plaque at the left carotid bifurcation without significant calcifications. Low resistance waveform of the left ICA. LEFT VERTEBRAL ARTERY: Left vertebral artery not identified. IMPRESSION: Color duplex indicates moderate heterogeneous plaque with no hemodynamically significant stenosis by duplex criteria in the extracranial cerebrovascular circulation. Left vertebral artery waveform not identified. Signed, Dulcy Fanny.  Dellia Nims, RPVI [facility] Radiology Electronically Signed   By: Corrie Mckusick D.O.   On: 05/09/2021 08:19   IR Intra Cran Stent  Result Date: 05/12/2021 INDICATION: ADIAH GUERECA is a 70 y.o. female with a past medical history significant for anemia, DM, HLD, CAD, HTN and prior CVA who presented to [facility] ED via EMS on 6/13 with complaints of weakness and slurred speech the day prior which had since resolved, per her daughter she had not been taking her medications as prescribed. She was noted to be hypertensive at 171/60 mmHg. Her neurologic exam was benign while in bed, however, patient presented left-sided paresthesia with ambulation. 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 CTA head showed evolving recent infarction of the right corona radiata without acute intracranial hemorrhage as well as marked stenoses of the right MCA origin and bifurcation, moderate left M2 MCA branch stenosis, diffusely irregular left vertebral artery with diminished flow and irregular left P2 PCA with moderate to marked stenosis. She has been transferred to [facility] for planned right MCA angioplasty/stent placement. EXAM: FLUOROSCOPY GUIDED  VASCULAR ACCESS DIAGNOSTIC CEREBRAL ANGIOGRAM INTRACRANIAL ANGIOPLASTY AND STENTING, RIGHT MCA FLAT PANEL HEAD CT COMPARISON:  CT/CT angiogram of the head May 09, 2021 MEDICATIONS: Refer to anesthesia documentation. ANESTHESIA/SEDATION: The procedure was performed under general anesthesia CONTRAST:  120 mL of Omnipaque 240 milligram/mL FLUOROSCOPY TIME:  Fluoroscopy Time: 75 minutes 6 seconds (1,935 mGy). COMPLICATIONS: None immediate. TECHNIQUE: Informed written consent was obtained from the patient after a thorough discussion of the procedural risks, benefits and alternatives. All questions were addressed. Maximal Sterile Barrier Technique was  utilized including caps, mask, sterile gowns, sterile gloves, sterile drape, hand hygiene and skin antiseptic. A timeout was performed prior to the initiation of the procedure. Using the modified Seldinger technique and a micropuncture kit, access was gained to the distal right radial artery at the anatomical snuffbox and a 6 French sheath was placed. Real-time ultrasound guidance was utilized for vascular access including the acquisition of a permanent ultrasound image documenting patency of the accessed vessel. Slow intra arterial infusion of 5,000 IU heparin, 5 mg Verapamil and 583 mcg nitroglicerin diluted in patient's own blood was performed. No significant fluctuation in patient's blood pressure seen. Then, a right radial artery roadmap was obtained via sheath side port. Normal brachial artery branching pattern seen. No significant anatomical variation. The right radial artery caliber is adequate for vascular access. Next, a 5 [place] Simmons 2 glide catheter was navigated over a 0.035" Terumo Glidewire into the right subclavian artery under fluoroscopic guidance. Frontal angiogram of the neck was obtained. The catheter was advanced into the aortic arch. The catheter tip was reformed. The catheter was then advanced into the left subclavian artery. Frontal angiogram of the neck was obtained. The catheter was subsequently placed into the left common carotid artery. Frontal and lateral angiograms of the neck were obtained. Under biplane roadmap, the catheter was then advanced into the left internal carotid artery. Frontal and lateral angiograms of the head were obtained. The catheter was again placed into the right subclavian artery. Frontal and lateral angiograms of the head were obtained. The the catheter was then placed clear into the right common carotid artery. Frontal and lateral angiograms of the neck were obtained. Under biplane roadmap, the the catheter was then advanced into the right internal carotid  artery. Frontal and lateral angiograms of the head were obtained. FINDINGS: 1. Normal course and caliber of the cervical right vertebral artery. 2. Left subclavian artery angiograms showed no evidence of opacification of the left vertebral artery, likely related to occlusion. 3. Mild atherosclerotic changes of the left carotid bifurcation without hemodynamically significant stenosis. 4. Severe stenosis of the P2 segment of the left fetal PCA. 5. Mild stenosis of a left M3/MCA posterior division branch. 6. No significant atherosclerotic changes or stenosis of the right carotid bifurcation. 7. Atherosclerotic changes of the right carotid siphons with mild stenosis at the paraclinoid segment. 8. Approximately 50% stenosis at the origin of the right M1/MCA segment. 9. Approximately 50% stenosis at the origin of a right inferior temporal branch. 10. Severe, hairline stenosis of the distal right M1/MCA segment, involving the origin of both anterior and posterior division branches. There is significant delay in opacification of the anterior division branches. PROCEDURE: This into catheter was exchanged over the wire and under biplane roadmap for a benchmark guide catheter which was placed in the distal cervical segment of the right ICA. Magnified frontal and lateral views of the head were obtained in the working projections. Then, attempted navigation of a 1.25 x 10 mm  sapphire balloon over a 0.014 inch Aristotle microguidewire proved unsuccessful due to siphon tortuosity. A headway duo microcatheter was navigated over the Aristotle with a wire into the right M2/MCA posterior division branch, distal to the stenosis. The wire was exchanged for a zoom 14 exchange micro guidewire. The microcatheter was then exchanged for the 1.25 x 10 mm sapphire balloon. Angioplasty was performed under fluoroscopy at the distal right M1/MCA. Right internal carotid artery angiograms with frontal lateral views showed in proved flow in the  distal right MCA branches. Attempted navigation of a 2 mm resolute onyx balloon mounted stent into the right M1/MCA proved unsuccessful due to siphon tortuosity. Then, a 2 x 8 mm apex balloon was navigated to the level of stenosis. Additional angioplasty was performed under fluoroscopy. Right internal carotid angiograms with magnified frontal and lateral views showed additional improvement of the degree of stenosis. The headway duo microcatheter was again navigated over the wire into the right M2/MCA posterior division branch. Subsequently, a 3 x 24 mm neuroform atlas stent was deployed in the distal right M1/MCA segment extending to the proximal M2 posterior division branch. Follow-up angiogram showed adequate stent positioning across the stenosis with significant improvement of the degree of stenosis and anterograde flow in the posterior division branch. Persistent slow flow in the anterior division branch noted. Attempted navigation of the headway duo over the 14 wire through the stent proved unsuccessful. Attempted navigation of the apex balloon also proved unsuccessful. The 2 mm apex balloon was then navigated into the proximal right M1/MCA. Angioplasty was performed under fluoroscopy. Follow-up angiograms showed mild improvement of the proximal right M1/MCA stenosis (now approximately 35%). Further attempts to navigate the balloon for the microcatheter through the previously deployed stent proved unsuccessful. The catheter was subsequently withdrawn. Follow-up angiograms with frontal and lateral views showed stable position and opacification of the stent. Delayed opacification of the anterior division branches noted, although improved from baseline. Flat panel CT of the head was obtained and post processed in a separate workstation with concurrent attending physician supervision. Selected images were sent to PACS. No evidence of hemorrhagic complication. IMPRESSION: 1. Severe intracranial atherosclerotic  disease with severe hairline stenosis of the distal right M1/MCA segment extending into the origins of the superior and inferior divisions and severe stenosis of the left P2/PCA. Mild (50%) stenosis of the proximal right M1/MCA and at the origin of the anterior temporal branch. 2. Angioplasty and stenting performed for treatment of the area of severe stenosis at the distal right M1/MCA segment with improvement of the anterograde flow. Persistent delayed, although improved, flow to the anterior division branch. No further angioplasty or stenting performed due to lack of access through the recently deployed stent. 3. Angioplasty performed at the proximal right M1/MCA segment with approximately 35% residual stenosis. PLAN: Continue on dual antiplatelet therapy for 6 month when stent patency should be evaluated. Electronically Signed   By: Pedro Earls M.D.   On: 05/12/2021 15:22   IR Intra Cran Stent  Result Date: 05/12/2021 INDICATION: Madison Davidson is a 70 y.o. female with a past medical history significant for anemia, DM, HLD, CAD, HTN and prior CVA who presented to [facility] ED via EMS on 6/13 with complaints of weakness and slurred speech the day prior which had since resolved, per her daughter she had not been taking her medications as prescribed. She was noted to be hypertensive at 171/60 mmHg. Her neurologic exam was benign while in bed, however, patient presented left-sided paresthesia  with ambulation. 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 CTA head showed evolving recent infarction of the right corona radiata without acute intracranial hemorrhage as well as marked stenoses of the right MCA origin and bifurcation, moderate left M2 MCA branch stenosis, diffusely irregular left vertebral artery with diminished flow and irregular left P2 PCA with moderate to marked stenosis. She has been  transferred to [facility] for planned right MCA angioplasty/stent placement. EXAM: FLUOROSCOPY GUIDED VASCULAR ACCESS DIAGNOSTIC CEREBRAL ANGIOGRAM INTRACRANIAL ANGIOPLASTY AND STENTING, RIGHT MCA FLAT PANEL HEAD CT COMPARISON:  CT/CT angiogram of the head May 09, 2021 MEDICATIONS: Refer to anesthesia documentation. ANESTHESIA/SEDATION: The procedure was performed under general anesthesia CONTRAST:  120 mL of Omnipaque 240 milligram/mL FLUOROSCOPY TIME:  Fluoroscopy Time: 75 minutes 6 seconds (1,935 mGy). COMPLICATIONS: None immediate. TECHNIQUE: Informed written consent was obtained from the patient after a thorough discussion of the procedural risks, benefits and alternatives. All questions were addressed. Maximal Sterile Barrier Technique was utilized including caps, mask, sterile gowns, sterile gloves, sterile drape, hand hygiene and skin antiseptic. A timeout was performed prior to the initiation of the procedure. Using the modified Seldinger technique and a micropuncture kit, access was gained to the distal right radial artery at the anatomical snuffbox and a 6 French sheath was placed. Real-time ultrasound guidance was utilized for vascular access including the acquisition of a permanent ultrasound image documenting patency of the accessed vessel. Slow intra arterial infusion of 5,000 IU heparin, 5 mg Verapamil and 701 mcg nitroglicerin diluted in patient's own blood was performed. No significant fluctuation in patient's blood pressure seen. Then, a right radial artery roadmap was obtained via sheath side port. Normal brachial artery branching pattern seen. No significant anatomical variation. The right radial artery caliber is adequate for vascular access. Next, a 5 [place] Simmons 2 glide catheter was navigated over a 0.035" Terumo Glidewire into the right subclavian artery under fluoroscopic guidance. Frontal angiogram of the neck was obtained. The catheter was advanced into the aortic arch. The catheter tip was  reformed. The catheter was then advanced into the left subclavian artery. Frontal angiogram of the neck was obtained. The catheter was subsequently placed into the left common carotid artery. Frontal and lateral angiograms of the neck were obtained. Under biplane roadmap, the catheter was then advanced into the left internal carotid artery. Frontal and lateral angiograms of the head were obtained. The catheter was again placed into the right subclavian artery. Frontal and lateral angiograms of the head were obtained. The the catheter was then placed clear into the right common carotid artery. Frontal and lateral angiograms of the neck were obtained. Under biplane roadmap, the the catheter was then advanced into the right internal carotid artery. Frontal and lateral angiograms of the head were obtained. FINDINGS: 1. Normal course and caliber of the cervical right vertebral artery. 2. Left subclavian artery angiograms showed no evidence of opacification of the left vertebral artery, likely related to occlusion. 3. Mild atherosclerotic changes of the left carotid bifurcation without hemodynamically significant stenosis. 4. Severe stenosis of the P2 segment of the left fetal PCA. 5. Mild stenosis of a left M3/MCA posterior division branch. 6. No significant atherosclerotic changes or stenosis of the right carotid bifurcation. 7. Atherosclerotic changes of the right carotid siphons with mild stenosis at the paraclinoid segment. 8. Approximately 50% stenosis at the origin of the right M1/MCA segment. 9. Approximately 50% stenosis at  the origin of a right inferior temporal branch. 10. Severe, hairline stenosis of the distal right M1/MCA segment, involving the origin of both anterior and posterior division branches. There is significant delay in opacification of the anterior division branches. PROCEDURE: This into catheter was exchanged over the wire and under biplane roadmap for a benchmark guide catheter which was placed  in the distal cervical segment of the right ICA. Magnified frontal and lateral views of the head were obtained in the working projections. Then, attempted navigation of a 1.25 x 10 mm sapphire balloon over a 0.014 inch Aristotle microguidewire proved unsuccessful due to siphon tortuosity. A headway duo microcatheter was navigated over the Aristotle with a wire into the right M2/MCA posterior division branch, distal to the stenosis. The wire was exchanged for a zoom 14 exchange micro guidewire. The microcatheter was then exchanged for the 1.25 x 10 mm sapphire balloon. Angioplasty was performed under fluoroscopy at the distal right M1/MCA. Right internal carotid artery angiograms with frontal lateral views showed in proved flow in the distal right MCA branches. Attempted navigation of a 2 mm resolute onyx balloon mounted stent into the right M1/MCA proved unsuccessful due to siphon tortuosity. Then, a 2 x 8 mm apex balloon was navigated to the level of stenosis. Additional angioplasty was performed under fluoroscopy. Right internal carotid angiograms with magnified frontal and lateral views showed additional improvement of the degree of stenosis. The headway duo microcatheter was again navigated over the wire into the right M2/MCA posterior division branch. Subsequently, a 3 x 24 mm neuroform atlas stent was deployed in the distal right M1/MCA segment extending to the proximal M2 posterior division branch. Follow-up angiogram showed adequate stent positioning across the stenosis with significant improvement of the degree of stenosis and anterograde flow in the posterior division branch. Persistent slow flow in the anterior division branch noted. Attempted navigation of the headway duo over the 14 wire through the stent proved unsuccessful. Attempted navigation of the apex balloon also proved unsuccessful. The 2 mm apex balloon was then navigated into the proximal right M1/MCA. Angioplasty was performed under  fluoroscopy. Follow-up angiograms showed mild improvement of the proximal right M1/MCA stenosis (now approximately 35%). Further attempts to navigate the balloon for the microcatheter through the previously deployed stent proved unsuccessful. The catheter was subsequently withdrawn. Follow-up angiograms with frontal and lateral views showed stable position and opacification of the stent. Delayed opacification of the anterior division branches noted, although improved from baseline. Flat panel CT of the head was obtained and post processed in a separate workstation with concurrent attending physician supervision. Selected images were sent to PACS. No evidence of hemorrhagic complication. IMPRESSION: 1. Severe intracranial atherosclerotic disease with severe hairline stenosis of the distal right M1/MCA segment extending into the origins of the superior and inferior divisions and severe stenosis of the left P2/PCA. Mild (50%) stenosis of the proximal right M1/MCA and at the origin of the anterior temporal branch. 2. Angioplasty and stenting performed for treatment of the area of severe stenosis at the distal right M1/MCA segment with improvement of the anterograde flow. Persistent delayed, although improved, flow to the anterior division branch. No further angioplasty or stenting performed due to lack of access through the recently deployed stent. 3. Angioplasty performed at the proximal right M1/MCA segment with approximately 35% residual stenosis. PLAN: Continue on dual antiplatelet therapy for 6 month when stent patency should be evaluated. Electronically Signed   By: Pedro Earls M.D.   On: 05/12/2021  15:22   IR CT Head Ltd  Result Date: 05/12/2021 INDICATION: Madison Davidson is a 70 y.o. female with a past medical history significant for anemia, DM, HLD, CAD, HTN and prior CVA who presented to [facility] ED via EMS on 6/13 with complaints of weakness and slurred speech the day prior which had since  resolved, per her daughter she had not been taking her medications as prescribed. She was noted to be hypertensive at 171/60 mmHg. Her neurologic exam was benign while in bed, however, patient presented left-sided paresthesia with ambulation. 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 CTA head showed evolving recent infarction of the right corona radiata without acute intracranial hemorrhage as well as marked stenoses of the right MCA origin and bifurcation, moderate left M2 MCA branch stenosis, diffusely irregular left vertebral artery with diminished flow and irregular left P2 PCA with moderate to marked stenosis. She has been transferred to [facility] for planned right MCA angioplasty/stent placement. EXAM: FLUOROSCOPY GUIDED VASCULAR ACCESS DIAGNOSTIC CEREBRAL ANGIOGRAM INTRACRANIAL ANGIOPLASTY AND STENTING, RIGHT MCA FLAT PANEL HEAD CT COMPARISON:  CT/CT angiogram of the head May 09, 2021 MEDICATIONS: Refer to anesthesia documentation. ANESTHESIA/SEDATION: The procedure was performed under general anesthesia CONTRAST:  120 mL of Omnipaque 240 milligram/mL FLUOROSCOPY TIME:  Fluoroscopy Time: 75 minutes 6 seconds (1,935 mGy). COMPLICATIONS: None immediate. TECHNIQUE: Informed written consent was obtained from the patient after a thorough discussion of the procedural risks, benefits and alternatives. All questions were addressed. Maximal Sterile Barrier Technique was utilized including caps, mask, sterile gowns, sterile gloves, sterile drape, hand hygiene and skin antiseptic. A timeout was performed prior to the initiation of the procedure. Using the modified Seldinger technique and a micropuncture kit, access was gained to the distal right radial artery at the anatomical snuffbox and a 6 French sheath was placed. Real-time ultrasound guidance was utilized for vascular access including the acquisition of a permanent  ultrasound image documenting patency of the accessed vessel. Slow intra arterial infusion of 5,000 IU heparin, 5 mg Verapamil and 341 mcg nitroglicerin diluted in patient's own blood was performed. No significant fluctuation in patient's blood pressure seen. Then, a right radial artery roadmap was obtained via sheath side port. Normal brachial artery branching pattern seen. No significant anatomical variation. The right radial artery caliber is adequate for vascular access. Next, a 5 [place] Simmons 2 glide catheter was navigated over a 0.035" Terumo Glidewire into the right subclavian artery under fluoroscopic guidance. Frontal angiogram of the neck was obtained. The catheter was advanced into the aortic arch. The catheter tip was reformed. The catheter was then advanced into the left subclavian artery. Frontal angiogram of the neck was obtained. The catheter was subsequently placed into the left common carotid artery. Frontal and lateral angiograms of the neck were obtained. Under biplane roadmap, the catheter was then advanced into the left internal carotid artery. Frontal and lateral angiograms of the head were obtained. The catheter was again placed into the right subclavian artery. Frontal and lateral angiograms of the head were obtained. The the catheter was then placed clear into the right common carotid artery. Frontal and lateral angiograms of the neck were obtained. Under biplane roadmap, the the catheter was then advanced into the right internal carotid artery. Frontal and lateral angiograms of the head were obtained. FINDINGS: 1. Normal course and caliber of the cervical right vertebral artery. 2. Left subclavian artery angiograms showed  no evidence of opacification of the left vertebral artery, likely related to occlusion. 3. Mild atherosclerotic changes of the left carotid bifurcation without hemodynamically significant stenosis. 4. Severe stenosis of the P2 segment of the left fetal PCA. 5. Mild  stenosis of a left M3/MCA posterior division branch. 6. No significant atherosclerotic changes or stenosis of the right carotid bifurcation. 7. Atherosclerotic changes of the right carotid siphons with mild stenosis at the paraclinoid segment. 8. Approximately 50% stenosis at the origin of the right M1/MCA segment. 9. Approximately 50% stenosis at the origin of a right inferior temporal branch. 10. Severe, hairline stenosis of the distal right M1/MCA segment, involving the origin of both anterior and posterior division branches. There is significant delay in opacification of the anterior division branches. PROCEDURE: This into catheter was exchanged over the wire and under biplane roadmap for a benchmark guide catheter which was placed in the distal cervical segment of the right ICA. Magnified frontal and lateral views of the head were obtained in the working projections. Then, attempted navigation of a 1.25 x 10 mm sapphire balloon over a 0.014 inch Aristotle microguidewire proved unsuccessful due to siphon tortuosity. A headway duo microcatheter was navigated over the Aristotle with a wire into the right M2/MCA posterior division branch, distal to the stenosis. The wire was exchanged for a zoom 14 exchange micro guidewire. The microcatheter was then exchanged for the 1.25 x 10 mm sapphire balloon. Angioplasty was performed under fluoroscopy at the distal right M1/MCA. Right internal carotid artery angiograms with frontal lateral views showed in proved flow in the distal right MCA branches. Attempted navigation of a 2 mm resolute onyx balloon mounted stent into the right M1/MCA proved unsuccessful due to siphon tortuosity. Then, a 2 x 8 mm apex balloon was navigated to the level of stenosis. Additional angioplasty was performed under fluoroscopy. Right internal carotid angiograms with magnified frontal and lateral views showed additional improvement of the degree of stenosis. The headway duo microcatheter was again  navigated over the wire into the right M2/MCA posterior division branch. Subsequently, a 3 x 24 mm neuroform atlas stent was deployed in the distal right M1/MCA segment extending to the proximal M2 posterior division branch. Follow-up angiogram showed adequate stent positioning across the stenosis with significant improvement of the degree of stenosis and anterograde flow in the posterior division branch. Persistent slow flow in the anterior division branch noted. Attempted navigation of the headway duo over the 14 wire through the stent proved unsuccessful. Attempted navigation of the apex balloon also proved unsuccessful. The 2 mm apex balloon was then navigated into the proximal right M1/MCA. Angioplasty was performed under fluoroscopy. Follow-up angiograms showed mild improvement of the proximal right M1/MCA stenosis (now approximately 35%). Further attempts to navigate the balloon for the microcatheter through the previously deployed stent proved unsuccessful. The catheter was subsequently withdrawn. Follow-up angiograms with frontal and lateral views showed stable position and opacification of the stent. Delayed opacification of the anterior division branches noted, although improved from baseline. Flat panel CT of the head was obtained and post processed in a separate workstation with concurrent attending physician supervision. Selected images were sent to PACS. No evidence of hemorrhagic complication. IMPRESSION: 1. Severe intracranial atherosclerotic disease with severe hairline stenosis of the distal right M1/MCA segment extending into the origins of the superior and inferior divisions and severe stenosis of the left P2/PCA. Mild (50%) stenosis of the proximal right M1/MCA and at the origin of the anterior temporal branch. 2. Angioplasty and  stenting performed for treatment of the area of severe stenosis at the distal right M1/MCA segment with improvement of the anterograde flow. Persistent delayed,  although improved, flow to the anterior division branch. No further angioplasty or stenting performed due to lack of access through the recently deployed stent. 3. Angioplasty performed at the proximal right M1/MCA segment with approximately 35% residual stenosis. PLAN: Continue on dual antiplatelet therapy for 6 month when stent patency should be evaluated. Electronically Signed   By: Pedro Earls M.D.   On: 05/12/2021 15:22   IR US Guide Vasc Access Right  Result Date: 05/12/2021 INDICATION: Madison Davidson is a 70 y.o. female with a past medical history significant for anemia, DM, HLD, CAD, HTN and prior CVA who presented to [facility] ED via EMS on 6/13 with complaints of weakness and slurred speech the day prior which had since resolved, per her daughter she had not been taking her medications as prescribed. She was noted to be hypertensive at 171/60 mmHg. Her neurologic exam was benign while in bed, however, patient presented left-sided paresthesia with ambulation. 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 CTA head showed evolving recent infarction of the right corona radiata without acute intracranial hemorrhage as well as marked stenoses of the right MCA origin and bifurcation, moderate left M2 MCA branch stenosis, diffusely irregular left vertebral artery with diminished flow and irregular left P2 PCA with moderate to marked stenosis. She has been transferred to [facility] for planned right MCA angioplasty/stent placement. EXAM: FLUOROSCOPY GUIDED VASCULAR ACCESS DIAGNOSTIC CEREBRAL ANGIOGRAM INTRACRANIAL ANGIOPLASTY AND STENTING, RIGHT MCA FLAT PANEL HEAD CT COMPARISON:  CT/CT angiogram of the head May 09, 2021 MEDICATIONS: Refer to anesthesia documentation. ANESTHESIA/SEDATION: The procedure was performed under general anesthesia CONTRAST:  120 mL of Omnipaque 240 milligram/mL FLUOROSCOPY TIME:   Fluoroscopy Time: 75 minutes 6 seconds (1,935 mGy). COMPLICATIONS: None immediate. TECHNIQUE: Informed written consent was obtained from the patient after a thorough discussion of the procedural risks, benefits and alternatives. All questions were addressed. Maximal Sterile Barrier Technique was utilized including caps, mask, sterile gowns, sterile gloves, sterile drape, hand hygiene and skin antiseptic. A timeout was performed prior to the initiation of the procedure. Using the modified Seldinger technique and a micropuncture kit, access was gained to the distal right radial artery at the anatomical snuffbox and a 6 French sheath was placed. Real-time ultrasound guidance was utilized for vascular access including the acquisition of a permanent ultrasound image documenting patency of the accessed vessel. Slow intra arterial infusion of 5,000 IU heparin, 5 mg Verapamil and 127 mcg nitroglicerin diluted in patient's own blood was performed. No significant fluctuation in patient's blood pressure seen. Then, a right radial artery roadmap was obtained via sheath side port. Normal brachial artery branching pattern seen. No significant anatomical variation. The right radial artery caliber is adequate for vascular access. Next, a 5 [place] Simmons 2 glide catheter was navigated over a 0.035" Terumo Glidewire into the right subclavian artery under fluoroscopic guidance. Frontal angiogram of the neck was obtained. The catheter was advanced into the aortic arch. The catheter tip was reformed. The catheter was then advanced into the left subclavian artery. Frontal angiogram of the neck was obtained. The catheter was subsequently placed into the left common carotid artery. Frontal and lateral angiograms of the neck were obtained. Under biplane roadmap, the catheter was then advanced into the left internal  carotid artery. Frontal and lateral angiograms of the head were obtained. The catheter was again placed into the right  subclavian artery. Frontal and lateral angiograms of the head were obtained. The the catheter was then placed clear into the right common carotid artery. Frontal and lateral angiograms of the neck were obtained. Under biplane roadmap, the the catheter was then advanced into the right internal carotid artery. Frontal and lateral angiograms of the head were obtained. FINDINGS: 1. Normal course and caliber of the cervical right vertebral artery. 2. Left subclavian artery angiograms showed no evidence of opacification of the left vertebral artery, likely related to occlusion. 3. Mild atherosclerotic changes of the left carotid bifurcation without hemodynamically significant stenosis. 4. Severe stenosis of the P2 segment of the left fetal PCA. 5. Mild stenosis of a left M3/MCA posterior division branch. 6. No significant atherosclerotic changes or stenosis of the right carotid bifurcation. 7. Atherosclerotic changes of the right carotid siphons with mild stenosis at the paraclinoid segment. 8. Approximately 50% stenosis at the origin of the right M1/MCA segment. 9. Approximately 50% stenosis at the origin of a right inferior temporal branch. 10. Severe, hairline stenosis of the distal right M1/MCA segment, involving the origin of both anterior and posterior division branches. There is significant delay in opacification of the anterior division branches. PROCEDURE: This into catheter was exchanged over the wire and under biplane roadmap for a benchmark guide catheter which was placed in the distal cervical segment of the right ICA. Magnified frontal and lateral views of the head were obtained in the working projections. Then, attempted navigation of a 1.25 x 10 mm sapphire balloon over a 0.014 inch Aristotle microguidewire proved unsuccessful due to siphon tortuosity. A headway duo microcatheter was navigated over the Aristotle with a wire into the right M2/MCA posterior division branch, distal to the stenosis. The wire  was exchanged for a zoom 14 exchange micro guidewire. The microcatheter was then exchanged for the 1.25 x 10 mm sapphire balloon. Angioplasty was performed under fluoroscopy at the distal right M1/MCA. Right internal carotid artery angiograms with frontal lateral views showed in proved flow in the distal right MCA branches. Attempted navigation of a 2 mm resolute onyx balloon mounted stent into the right M1/MCA proved unsuccessful due to siphon tortuosity. Then, a 2 x 8 mm apex balloon was navigated to the level of stenosis. Additional angioplasty was performed under fluoroscopy. Right internal carotid angiograms with magnified frontal and lateral views showed additional improvement of the degree of stenosis. The headway duo microcatheter was again navigated over the wire into the right M2/MCA posterior division branch. Subsequently, a 3 x 24 mm neuroform atlas stent was deployed in the distal right M1/MCA segment extending to the proximal M2 posterior division branch. Follow-up angiogram showed adequate stent positioning across the stenosis with significant improvement of the degree of stenosis and anterograde flow in the posterior division branch. Persistent slow flow in the anterior division branch noted. Attempted navigation of the headway duo over the 14 wire through the stent proved unsuccessful. Attempted navigation of the apex balloon also proved unsuccessful. The 2 mm apex balloon was then navigated into the proximal right M1/MCA. Angioplasty was performed under fluoroscopy. Follow-up angiograms showed mild improvement of the proximal right M1/MCA stenosis (now approximately 35%). Further attempts to navigate the balloon for the microcatheter through the previously deployed stent proved unsuccessful. The catheter was subsequently withdrawn. Follow-up angiograms with frontal and lateral views showed stable position and opacification of the stent. Delayed opacification  of the anterior division branches noted,  although improved from baseline. Flat panel CT of the head was obtained and post processed in a separate workstation with concurrent attending physician supervision. Selected images were sent to PACS. No evidence of hemorrhagic complication. IMPRESSION: 1. Severe intracranial atherosclerotic disease with severe hairline stenosis of the distal right M1/MCA segment extending into the origins of the superior and inferior divisions and severe stenosis of the left P2/PCA. Mild (50%) stenosis of the proximal right M1/MCA and at the origin of the anterior temporal branch. 2. Angioplasty and stenting performed for treatment of the area of severe stenosis at the distal right M1/MCA segment with improvement of the anterograde flow. Persistent delayed, although improved, flow to the anterior division branch. No further angioplasty or stenting performed due to lack of access through the recently deployed stent. 3. Angioplasty performed at the proximal right M1/MCA segment with approximately 35% residual stenosis. PLAN: Continue on dual antiplatelet therapy for 6 month when stent patency should be evaluated. Electronically Signed   By: Pedro Earls M.D.   On: 05/12/2021 15:22   DG Knee Complete 4 Views Left  Result Date: 05/08/2021 CLINICAL DATA:  Recent fall with left knee pain, initial encounter EXAM: LEFT KNEE - COMPLETE 4+ VIEW COMPARISON:  None. FINDINGS: No evidence of fracture, dislocation, or joint effusion. No evidence of arthropathy or other focal bone abnormality. Soft tissues are unremarkable. IMPRESSION: No acute abnormality noted. Electronically Signed   By: Inez Catalina M.D.   On: 05/08/2021 15:32   ECHOCARDIOGRAM COMPLETE  Result Date: 05/09/2021    ECHOCARDIOGRAM REPORT   Patient Name:   SOPHIAMARIE NEASE Sloma Date of Exam: 05/09/2021 Medical Rec #:  [id number]        Height:       62.0 in Accession #:    [id number]       Weight:       175.5 lb Date of Birth:  October 06, 1951       BSA:           1.808 m Patient Age:    20 years         BP:           179/73 mmHg Patient Gender: F                HR:           73 bpm. Exam Location:  [facility] Procedure: 2D Echo, Cardiac Doppler, Color Doppler and Saline Contrast Bubble            Study Indications:     Stroke I63.9  History:         Patient has no prior history of Echocardiogram examinations.                  Stroke; Risk Factors:Diabetes.  Sonographer:     Sherrie Sport RDCS (AE) Referring Phys:  Bud Diagnosing Phys: Ida Rogue MD IMPRESSIONS  1. Left ventricular ejection fraction, by estimation, is 60 to 65%. The left ventricle has normal function. The left ventricle has no regional wall motion abnormalities. Left ventricular diastolic parameters are indeterminate.  2. Right ventricular systolic function is normal. The right ventricular size is normal. FINDINGS  Left Ventricle: Left ventricular ejection fraction, by estimation, is 60 to 65%. The left ventricle has normal function. The left ventricle has no regional wall motion abnormalities. The left ventricular internal cavity size was normal in size. There is  no left ventricular  hypertrophy. Left ventricular diastolic parameters are indeterminate. Right Ventricle: The right ventricular size is normal. No increase in right ventricular wall thickness. Right ventricular systolic function is normal. Left Atrium: Left atrial size was normal in size. Right Atrium: Right atrial size was normal in size. Pericardium: There is no evidence of pericardial effusion. Mitral Valve: The mitral valve was not well visualized. No evidence of mitral valve regurgitation. No evidence of mitral valve stenosis. Tricuspid Valve: The tricuspid valve is not well visualized. Tricuspid valve regurgitation is not demonstrated. No evidence of tricuspid stenosis. Aortic Valve: The aortic valve was not well visualized. Aortic valve regurgitation is not visualized. No aortic stenosis is present. Pulmonic Valve: The pulmonic  valve was not well visualized. Pulmonic valve regurgitation is not visualized. No evidence of pulmonic stenosis. Aorta: The aortic root is normal in size and structure. Venous: The inferior vena cava is normal in size with greater than 50% respiratory variability, suggesting right atrial pressure of 3 mmHg. IAS/Shunts: No atrial level shunt detected by color flow Doppler. Agitated saline contrast was given intravenously to evaluate for intracardiac shunting.  LEFT VENTRICLE PLAX 2D LVIDd:         3.70 cm LVIDs:         2.30 cm LV PW:         1.20 cm LV IVS:        1.30 cm LVOT diam:     2.10 cm LVOT Area:     3.46 cm  LEFT ATRIUM         Index LA diam:    3.20 cm 1.77 cm/m                        PULMONIC VALVE AORTA                 PV Vmax:        0.99 m/s Ao Root diam: 3.20 cm PV Peak grad:   3.9 mmHg                       RVOT Peak grad: 5 mmHg   SHUNTS Systemic Diam: 2.10 cm Ida Rogue MD Electronically signed by Ida Rogue MD Signature Date/Time: 05/09/2021/5:06:01 PM    Final    IR ANGIO INTRA EXTRACRAN SEL COM CAROTID INNOMINATE UNI L MOD SED  Result Date: 05/12/2021 INDICATION: Madison Davidson is a 70 y.o. female with a past medical history significant for anemia, DM, HLD, CAD, HTN and prior CVA who presented to [facility] ED via EMS on 6/13 with complaints of weakness and slurred speech the day prior which had since resolved, per her daughter she had not been taking her medications as prescribed. She was noted to be hypertensive at 171/60 mmHg. Her neurologic exam was benign while in bed, however, patient presented left-sided paresthesia with ambulation. 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 CTA head showed evolving recent infarction of the right corona radiata without acute intracranial hemorrhage as well as marked stenoses of the right MCA origin and bifurcation, moderate left M2 MCA branch  stenosis, diffusely irregular left vertebral artery with diminished flow and irregular left P2 PCA with moderate to marked stenosis. She has been transferred to [facility] for planned right MCA angioplasty/stent placement. EXAM: FLUOROSCOPY GUIDED VASCULAR ACCESS DIAGNOSTIC CEREBRAL ANGIOGRAM INTRACRANIAL ANGIOPLASTY AND STENTING, RIGHT MCA FLAT PANEL HEAD CT  COMPARISON:  CT/CT angiogram of the head May 09, 2021 MEDICATIONS: Refer to anesthesia documentation. ANESTHESIA/SEDATION: The procedure was performed under general anesthesia CONTRAST:  120 mL of Omnipaque 240 milligram/mL FLUOROSCOPY TIME:  Fluoroscopy Time: 75 minutes 6 seconds (1,935 mGy). COMPLICATIONS: None immediate. TECHNIQUE: Informed written consent was obtained from the patient after a thorough discussion of the procedural risks, benefits and alternatives. All questions were addressed. Maximal Sterile Barrier Technique was utilized including caps, mask, sterile gowns, sterile gloves, sterile drape, hand hygiene and skin antiseptic. A timeout was performed prior to the initiation of the procedure. Using the modified Seldinger technique and a micropuncture kit, access was gained to the distal right radial artery at the anatomical snuffbox and a 6 French sheath was placed. Real-time ultrasound guidance was utilized for vascular access including the acquisition of a permanent ultrasound image documenting patency of the accessed vessel. Slow intra arterial infusion of 5,000 IU heparin, 5 mg Verapamil and 433 mcg nitroglicerin diluted in patient's own blood was performed. No significant fluctuation in patient's blood pressure seen. Then, a right radial artery roadmap was obtained via sheath side port. Normal brachial artery branching pattern seen. No significant anatomical variation. The right radial artery caliber is adequate for vascular access. Next, a 5 [place] Simmons 2 glide catheter was navigated over a 0.035" Terumo Glidewire into the right subclavian  artery under fluoroscopic guidance. Frontal angiogram of the neck was obtained. The catheter was advanced into the aortic arch. The catheter tip was reformed. The catheter was then advanced into the left subclavian artery. Frontal angiogram of the neck was obtained. The catheter was subsequently placed into the left common carotid artery. Frontal and lateral angiograms of the neck were obtained. Under biplane roadmap, the catheter was then advanced into the left internal carotid artery. Frontal and lateral angiograms of the head were obtained. The catheter was again placed into the right subclavian artery. Frontal and lateral angiograms of the head were obtained. The the catheter was then placed clear into the right common carotid artery. Frontal and lateral angiograms of the neck were obtained. Under biplane roadmap, the the catheter was then advanced into the right internal carotid artery. Frontal and lateral angiograms of the head were obtained. FINDINGS: 1. Normal course and caliber of the cervical right vertebral artery. 2. Left subclavian artery angiograms showed no evidence of opacification of the left vertebral artery, likely related to occlusion. 3. Mild atherosclerotic changes of the left carotid bifurcation without hemodynamically significant stenosis. 4. Severe stenosis of the P2 segment of the left fetal PCA. 5. Mild stenosis of a left M3/MCA posterior division branch. 6. No significant atherosclerotic changes or stenosis of the right carotid bifurcation. 7. Atherosclerotic changes of the right carotid siphons with mild stenosis at the paraclinoid segment. 8. Approximately 50% stenosis at the origin of the right M1/MCA segment. 9. Approximately 50% stenosis at the origin of a right inferior temporal branch. 10. Severe, hairline stenosis of the distal right M1/MCA segment, involving the origin of both anterior and posterior division branches. There is significant delay in opacification of the anterior  division branches. PROCEDURE: This into catheter was exchanged over the wire and under biplane roadmap for a benchmark guide catheter which was placed in the distal cervical segment of the right ICA. Magnified frontal and lateral views of the head were obtained in the working projections. Then, attempted navigation of a 1.25 x 10 mm sapphire balloon over a 0.014 inch Aristotle microguidewire proved unsuccessful due to siphon tortuosity. A  headway duo microcatheter was navigated over the Aristotle with a wire into the right M2/MCA posterior division branch, distal to the stenosis. The wire was exchanged for a zoom 14 exchange micro guidewire. The microcatheter was then exchanged for the 1.25 x 10 mm sapphire balloon. Angioplasty was performed under fluoroscopy at the distal right M1/MCA. Right internal carotid artery angiograms with frontal lateral views showed in proved flow in the distal right MCA branches. Attempted navigation of a 2 mm resolute onyx balloon mounted stent into the right M1/MCA proved unsuccessful due to siphon tortuosity. Then, a 2 x 8 mm apex balloon was navigated to the level of stenosis. Additional angioplasty was performed under fluoroscopy. Right internal carotid angiograms with magnified frontal and lateral views showed additional improvement of the degree of stenosis. The headway duo microcatheter was again navigated over the wire into the right M2/MCA posterior division branch. Subsequently, a 3 x 24 mm neuroform atlas stent was deployed in the distal right M1/MCA segment extending to the proximal M2 posterior division branch. Follow-up angiogram showed adequate stent positioning across the stenosis with significant improvement of the degree of stenosis and anterograde flow in the posterior division branch. Persistent slow flow in the anterior division branch noted. Attempted navigation of the headway duo over the 14 wire through the stent proved unsuccessful. Attempted navigation of the  apex balloon also proved unsuccessful. The 2 mm apex balloon was then navigated into the proximal right M1/MCA. Angioplasty was performed under fluoroscopy. Follow-up angiograms showed mild improvement of the proximal right M1/MCA stenosis (now approximately 35%). Further attempts to navigate the balloon for the microcatheter through the previously deployed stent proved unsuccessful. The catheter was subsequently withdrawn. Follow-up angiograms with frontal and lateral views showed stable position and opacification of the stent. Delayed opacification of the anterior division branches noted, although improved from baseline. Flat panel CT of the head was obtained and post processed in a separate workstation with concurrent attending physician supervision. Selected images were sent to PACS. No evidence of hemorrhagic complication. IMPRESSION: 1. Severe intracranial atherosclerotic disease with severe hairline stenosis of the distal right M1/MCA segment extending into the origins of the superior and inferior divisions and severe stenosis of the left P2/PCA. Mild (50%) stenosis of the proximal right M1/MCA and at the origin of the anterior temporal branch. 2. Angioplasty and stenting performed for treatment of the area of severe stenosis at the distal right M1/MCA segment with improvement of the anterograde flow. Persistent delayed, although improved, flow to the anterior division branch. No further angioplasty or stenting performed due to lack of access through the recently deployed stent. 3. Angioplasty performed at the proximal right M1/MCA segment with approximately 35% residual stenosis. PLAN: Continue on dual antiplatelet therapy for 6 month when stent patency should be evaluated. Electronically Signed   By: Pedro Earls M.D.   On: 05/12/2021 15:22   IR ANGIO VERTEBRAL SEL VERTEBRAL UNI R MOD SED  Result Date: 05/12/2021 INDICATION: Madison Davidson is a 70 y.o. female with a past medical  history significant for anemia, DM, HLD, CAD, HTN and prior CVA who presented to [facility] ED via EMS on 6/13 with complaints of weakness and slurred speech the day prior which had since resolved, per her daughter she had not been taking her medications as prescribed. She was noted to be hypertensive at 171/60 mmHg. Her neurologic exam was benign while in bed, however, patient presented left-sided paresthesia with ambulation. 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 CTA head showed evolving recent infarction of the right corona radiata without acute intracranial hemorrhage as well as marked stenoses of the right MCA origin and bifurcation, moderate left M2 MCA branch stenosis, diffusely irregular left vertebral artery with diminished flow and irregular left P2 PCA with moderate to marked stenosis. She has been transferred to [facility] for planned right MCA angioplasty/stent placement. EXAM: FLUOROSCOPY GUIDED VASCULAR ACCESS DIAGNOSTIC CEREBRAL ANGIOGRAM INTRACRANIAL ANGIOPLASTY AND STENTING, RIGHT MCA FLAT PANEL HEAD CT COMPARISON:  CT/CT angiogram of the head May 09, 2021 MEDICATIONS: Refer to anesthesia documentation. ANESTHESIA/SEDATION: The procedure was performed under general anesthesia CONTRAST:  120 mL of Omnipaque 240 milligram/mL FLUOROSCOPY TIME:  Fluoroscopy Time: 75 minutes 6 seconds (1,935 mGy). COMPLICATIONS: None immediate. TECHNIQUE: Informed written consent was obtained from the patient after a thorough discussion of the procedural risks, benefits and alternatives. All questions were addressed. Maximal Sterile Barrier Technique was utilized including caps, mask, sterile gowns, sterile gloves, sterile drape, hand hygiene and skin antiseptic. A timeout was performed prior to the initiation of the procedure. Using the modified Seldinger technique and a micropuncture kit, access was gained to the distal right  radial artery at the anatomical snuffbox and a 6 French sheath was placed. Real-time ultrasound guidance was utilized for vascular access including the acquisition of a permanent ultrasound image documenting patency of the accessed vessel. Slow intra arterial infusion of 5,000 IU heparin, 5 mg Verapamil and 157 mcg nitroglicerin diluted in patient's own blood was performed. No significant fluctuation in patient's blood pressure seen. Then, a right radial artery roadmap was obtained via sheath side port. Normal brachial artery branching pattern seen. No significant anatomical variation. The right radial artery caliber is adequate for vascular access. Next, a 5 [place] Simmons 2 glide catheter was navigated over a 0.035" Terumo Glidewire into the right subclavian artery under fluoroscopic guidance. Frontal angiogram of the neck was obtained. The catheter was advanced into the aortic arch. The catheter tip was reformed. The catheter was then advanced into the left subclavian artery. Frontal angiogram of the neck was obtained. The catheter was subsequently placed into the left common carotid artery. Frontal and lateral angiograms of the neck were obtained. Under biplane roadmap, the catheter was then advanced into the left internal carotid artery. Frontal and lateral angiograms of the head were obtained. The catheter was again placed into the right subclavian artery. Frontal and lateral angiograms of the head were obtained. The the catheter was then placed clear into the right common carotid artery. Frontal and lateral angiograms of the neck were obtained. Under biplane roadmap, the the catheter was then advanced into the right internal carotid artery. Frontal and lateral angiograms of the head were obtained. FINDINGS: 1. Normal course and caliber of the cervical right vertebral artery. 2. Left subclavian artery angiograms showed no evidence of opacification of the left vertebral artery, likely related to occlusion. 3.  Mild atherosclerotic changes of the left carotid bifurcation without hemodynamically significant stenosis. 4. Severe stenosis of the P2 segment of the left fetal PCA. 5. Mild stenosis of a left M3/MCA posterior division branch. 6. No significant atherosclerotic changes or stenosis of the right carotid bifurcation. 7. Atherosclerotic changes of the right carotid siphons with mild stenosis at the paraclinoid segment. 8. Approximately 50% stenosis at the origin of the right M1/MCA segment. 9. Approximately 50% stenosis at the origin of a right inferior temporal branch. 10. Severe,  hairline stenosis of the distal right M1/MCA segment, involving the origin of both anterior and posterior division branches. There is significant delay in opacification of the anterior division branches. PROCEDURE: This into catheter was exchanged over the wire and under biplane roadmap for a benchmark guide catheter which was placed in the distal cervical segment of the right ICA. Magnified frontal and lateral views of the head were obtained in the working projections. Then, attempted navigation of a 1.25 x 10 mm sapphire balloon over a 0.014 inch Aristotle microguidewire proved unsuccessful due to siphon tortuosity. A headway duo microcatheter was navigated over the Aristotle with a wire into the right M2/MCA posterior division branch, distal to the stenosis. The wire was exchanged for a zoom 14 exchange micro guidewire. The microcatheter was then exchanged for the 1.25 x 10 mm sapphire balloon. Angioplasty was performed under fluoroscopy at the distal right M1/MCA. Right internal carotid artery angiograms with frontal lateral views showed in proved flow in the distal right MCA branches. Attempted navigation of a 2 mm resolute onyx balloon mounted stent into the right M1/MCA proved unsuccessful due to siphon tortuosity. Then, a 2 x 8 mm apex balloon was navigated to the level of stenosis. Additional angioplasty was performed under  fluoroscopy. Right internal carotid angiograms with magnified frontal and lateral views showed additional improvement of the degree of stenosis. The headway duo microcatheter was again navigated over the wire into the right M2/MCA posterior division branch. Subsequently, a 3 x 24 mm neuroform atlas stent was deployed in the distal right M1/MCA segment extending to the proximal M2 posterior division branch. Follow-up angiogram showed adequate stent positioning across the stenosis with significant improvement of the degree of stenosis and anterograde flow in the posterior division branch. Persistent slow flow in the anterior division branch noted. Attempted navigation of the headway duo over the 14 wire through the stent proved unsuccessful. Attempted navigation of the apex balloon also proved unsuccessful. The 2 mm apex balloon was then navigated into the proximal right M1/MCA. Angioplasty was performed under fluoroscopy. Follow-up angiograms showed mild improvement of the proximal right M1/MCA stenosis (now approximately 35%). Further attempts to navigate the balloon for the microcatheter through the previously deployed stent proved unsuccessful. The catheter was subsequently withdrawn. Follow-up angiograms with frontal and lateral views showed stable position and opacification of the stent. Delayed opacification of the anterior division branches noted, although improved from baseline. Flat panel CT of the head was obtained and post processed in a separate workstation with concurrent attending physician supervision. Selected images were sent to PACS. No evidence of hemorrhagic complication. IMPRESSION: 1. Severe intracranial atherosclerotic disease with severe hairline stenosis of the distal right M1/MCA segment extending into the origins of the superior and inferior divisions and severe stenosis of the left P2/PCA. Mild (50%) stenosis of the proximal right M1/MCA and at the origin of the anterior temporal branch.  2. Angioplasty and stenting performed for treatment of the area of severe stenosis at the distal right M1/MCA segment with improvement of the anterograde flow. Persistent delayed, although improved, flow to the anterior division branch. No further angioplasty or stenting performed due to lack of access through the recently deployed stent. 3. Angioplasty performed at the proximal right M1/MCA segment with approximately 35% residual stenosis. PLAN: Continue on dual antiplatelet therapy for 6 month when stent patency should be evaluated. Electronically Signed   By: Pedro Earls M.D.   On: 05/12/2021 15:22    SIGNED: Deatra James, MD, FHM.  [facility],  Pager (please use [email or web address] to page/text) Please use Epic Secure Chat for non-urgent communication (7AM-7PM)  If 7PM-7AM, please contact night-coverage [email or web address], 05/13/2021, 11:46 AM

## 2021-05-14 DIAGNOSIS — R4781 Slurred speech: Secondary | ICD-10-CM

## 2021-05-14 DIAGNOSIS — I10 Essential (primary) hypertension: Secondary | ICD-10-CM

## 2021-05-14 DIAGNOSIS — I63411 Cerebral infarction due to embolism of right middle cerebral artery: Secondary | ICD-10-CM

## 2021-05-14 LAB — BASIC METABOLIC PANEL
Anion gap: 9 (ref 5–15)
BUN: 16 mg/dL (ref 8–23)
CO2: 24 mmol/L (ref 22–32)
Calcium: 8.8 mg/dL — ABNORMAL LOW (ref 8.9–10.3)
Chloride: 102 mmol/L (ref 98–111)
Creatinine, Ser: 0.75 mg/dL (ref 0.44–1.00)
GFR, Estimated: >60 mL/min (ref >60)
Glucose, Bld: 191 mg/dL — ABNORMAL HIGH (ref 70–99)
Potassium: 4.1 mmol/L (ref 3.5–5.1)
Sodium: 135 mmol/L (ref 135–145)

## 2021-05-14 LAB — GLUCOSE, CAPILLARY
Glucose-Capillary: 180 mg/dL — ABNORMAL HIGH (ref 70–99)
Glucose-Capillary: 204 mg/dL — ABNORMAL HIGH (ref 70–99)
Glucose-Capillary: 251 mg/dL — ABNORMAL HIGH (ref 70–99)
Glucose-Capillary: 155 mg/dL — ABNORMAL HIGH (ref 70–99)

## 2021-05-14 LAB — RPR: RPR Ser Ql: NONREACTIVE

## 2021-05-14 MED ORDER — MELATONIN 3 MG PO TABS: 3.0000 mg | ORAL_TABLET | Freq: Every evening | ORAL | 0 refills | Status: DC | PRN

## 2021-05-14 MED ORDER — LISINOPRIL 10 MG PO TABS
10.0000 mg | ORAL_TABLET | Freq: Every day | ORAL | Status: DC
  Administered 2021-05-14 – 2021-05-15 (×2): 10 mg via ORAL
  Filled 2021-05-14 (×2): qty 1

## 2021-05-14 MED ORDER — CLOPIDOGREL BISULFATE 75 MG PO TABS: 75.0000 mg | ORAL_TABLET | Freq: Every day | ORAL | 0 refills | Status: DC

## 2021-05-14 MED ORDER — INSULIN ASPART 100 UNIT/ML IJ SOLN: 0.0000 [IU] | Freq: Three times a day (TID) | INTRAMUSCULAR | 11 refills | Status: DC

## 2021-05-14 MED ORDER — LISINOPRIL 10 MG PO TABS: 10.0000 mg | ORAL_TABLET | Freq: Every day | ORAL | 0 refills | Status: DC

## 2021-05-14 NOTE — Discharge Summary (Signed)
Physician Discharge Summary [facility]    Patient: Madison Davidson                   Admit date: 05/11/2021   DOB: 1951-05-24             Discharge date:05/14/2021/11:43 AM [id number]                          PCP: [facility]  Disposition:  CIR   Recommendations for Outpatient Follow-up:   Follow up: Neurologist in 2 to 3 weeks Follow-up with PCP in 1-2 weeks Continue current recommended medication including dual antiplatelet treatment for 1 month  Discharge Condition: Stable   Code Status:   Code Status: Full Code  Diet recommendation: Diabetic diet   Discharge Diagnoses:    Active Problems:   Slurred speech   Benign essential hypertension   Hyperlipidemia, mixed   DM II (diabetes mellitus, type II), controlled (HCC)   CVA (cerebral vascular accident) ([place])   Intracranial atherosclerosis   Cerebral thrombosis with cerebral infarction   History of Present Illness/ Hospital Course Madison Davidson Summary:     Madison Davidson is a 70 y.o. female with medical history significant for type 2 diabetes, hypertension, hyperlipidemia, hypothyroidism, obesity, coronary artery disease, prior CVA, who initially presented to [facility] ED on 05/08/21 due to transient slurring of speech.  She was admitted for stroke work-up.  Work-up revealed severe symptomatic right MCA stenosis.  At [facility] she was followed by neurology who recommended transfer to [facility] for urgent but not emergent intervention, possible intracranial stenting of the right MCA origin and bifurcation.  Patient was seen and examined in her room at [facility] 3W unit.  She has no new complaints at the time of this visit.  States she is sleepy.  Her speech appears improved.   ED Course:  Direct transfer from [facility] to [facility].      Intracranial atherosclerosis/ Severe symptomatic right MCA stenosis -Currently stable, no further new focal neurological findings  -05/11/2021 status post IR stenting right distal M1  MCA -tolerated procedure well  Presented with transient slurring of speech at [facility] -No progression of symptoms, no new focal neurological findings Work-up revealed severe symptomatic right MCA stenosis Seen by neurology at [facility] with recommendation for transfer to [facility] for possible intracranial stenting of the right MCA origin and bifurcation.  -Neurology consulted Madison Davidson Per neurology, DAPT aspirin 81 mg daily and Plavix 75 mg daily, continue Lipitor 80 mg daily. on aspirin 81 mg daily and clopidogrel 75 mg daily DAPT for 3 months and then ASA alone given multifocal intracranial stenosis.... Per neurology Patient may need a PCSK9 inhibitor outpatient, LDL not at goal.    Hyperlipidemia LDL 119 on 05/09/2021 Goal LDL less than 70. Continue high intensity statin Lipitor 80 mg daily. May need a PCSK9 inhibitor as stated above. -Recommending outpatient lipid allergist evaluation -Stable  Type 2 diabetes with hyperglycemia Hemoglobin A1c 7.1 on 05/09/2021 Goal A1c less than 7.0 Insulin sliding scale Remained stable   Hypothyroidism Resume home levothyroxine -Stable   Chronic anxiety/depression Resume home regimen. -Stable  Obesity BMI 32 Recommend weight loss outpatient with regular physical activity and healthy dieting.   Severe generalized weakness, debility, dysarthric -PT OT consulted, evaluation completed recommending CIR-rehab -Pending patient approval    DVT prophylaxis: SCDs.  Code Status: Full code.  Family Communication: None at bedside.  Disposition Plan:  Presented from home, anticipating  inpatient rehab placement in AM    Consults called: IR, please consult neurology in the morning.   Admission status: Inpatient status.  Patient will require at least 2 midnights for further evaluation and treatment of present condition.     Status is: Inpatient       Dispo:             Patient From: Home            Planned Disposition: Home, possibly on 05/15/2021  to  CIR       Discharge Instructions:   Discharge Instructions     Activity as tolerated - No restrictions   Complete by: As directed    Ambulatory referral to Neurology   Complete by: As directed    Follow up with stroke clinic NP (Jessica Vanschaick or Cecille Rubin, if both not available, consider Zachery Dauer, or Ahern) at [facility] in about 4 weeks. Thanks.   Diet - low sodium heart healthy   Complete by: As directed    Discharge instructions   Complete by: As directed    Continue current plan dual antiplatelet therapy for 1 month as instructed, continue high-dose statins, follow-up with a neurologist in 2-4 weeks   Increase activity slowly   Complete by: As directed         Medication List     STOP taking these medications    meloxicam 15 MG tablet Commonly known as: MOBIC   nortriptyline 10 MG capsule Commonly known as: PAMELOR       TAKE these medications    aspirin EC 81 MG tablet Take 81 mg by mouth daily as needed (headache).   atorvastatin 20 MG tablet Commonly known as: LIPITOR Take 4 tablets (80 mg total) by mouth at bedtime. What changed: how much to take   citalopram 10 MG tablet Commonly known as: CELEXA Take 10 mg by mouth daily.   clopidogrel 75 MG tablet Commonly known as: PLAVIX Take 1 tablet (75 mg total) by mouth daily. Start taking on: May 15, 2021   glipiZIDE 10 MG tablet Commonly known as: GLUCOTROL Take 10 mg by mouth 2 (two) times daily before a meal.   insulin aspart 100 UNIT/ML injection Commonly known as: novoLOG Inject 0-15 Units into the skin 3 (three) times daily with meals.   levothyroxine 125 MCG tablet Commonly known as: SYNTHROID Take 1 tablet (125 mcg total) by mouth daily at 6 (six) AM.   lisinopril 10 MG tablet Commonly known as: ZESTRIL Take 1 tablet (10 mg total) by mouth daily. Start taking on: May 15, 2021   melatonin 3 MG Tabs tablet Take 1 tablet (3 mg total) by mouth at bedtime as needed.    metFORMIN 500 MG tablet Commonly known as: GLUCOPHAGE Take 1,000 mg by mouth 2 (two) times daily with a meal.        Follow-up Information     [facility]. Schedule an appointment as soon as possible for a visit in 1 month(s).   Specialty: Neurology Why: stroke clinic Contact information: [place] [phone]        de Rosario Jacks, MD Follow up.   Specialties: Radiology, Interventional Radiology Why: IR scheduler will call you to schedule diagnostic cerebral angiogram (about 6 months from time of your procedure). Please call [phone] or [phone] with any questions or concerns prior to your appoitnment. Contact information: [place]  [phone]                No Known Allergies   Procedures /Studies:   CT ANGIO HEAD W OR WO CONTRAST  Result Date: 05/09/2021 CLINICAL DATA:  Acute stroke on MRI brain EXAM: CT ANGIOGRAPHY HEAD TECHNIQUE: Multidetector CT imaging of the head was performed using the standard protocol during bolus administration of intravenous contrast. Multiplanar CT image reconstructions and MIPs were obtained to evaluate the vascular anatomy. CONTRAST:  57m OMNIPAQUE IOHEXOL 350 MG/ML SOLN COMPARISON:  CT 05/08/2021, correlation made with MRI brain 05/08/2021 FINDINGS: CT HEAD Brain: Evolving recent infarction is again identified within the right corona radiata. Small area of right frontal cortical involvement on the MRI is not visible by CT. There is no acute intracranial hemorrhage. No significant mass effect. Chronic infarct of the left basal ganglia and adjacent white matter with ex vacuo dilatation of the adjacent lateral ventricle. No hydrocephalus. No extra-axial collection. Vascular: There is atherosclerotic calcification at the skull base. Skull: Calvarium is unremarkable. Sinuses/Orbits: No acute finding. Other:  None. CTA HEAD Anterior circulation: Intracranial internal carotid arteries are patent with calcified plaque but no significant stenosis. Anterior cerebral arteries are patent. There is eccentric plaque at the origin of the right MCA with marked stenosis. High-grade stenosis is present at the right MCA bifurcation. Left MCA is patent. There is moderate stenosis of an anterior division M2 branch. Posterior circulation: Dominant intracranial right vertebral artery is patent. Proximal intracranial left vertebral artery is irregular with diminished enhancement. There is improved opacification more distally beginning at the PICA origin, which may reflect retrograde flow. Basilar artery is patent. Major cerebellar artery origins are patent. A left posterior communicating artery is present. There is irregularity of the left P2 PCA with moderate to marked stenosis. Venous sinuses: As permitted by contrast timing, patent. Review of the MIP images confirms the above findings. IMPRESSION: Evolving recent infarction of the right corona radiata. No acute intracranial hemorrhage. 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 Moderate left M2 MCA branch stenosis. Diffusely irregular left vertebral artery with diminished flow. There is improvement beginning at the PICA origin likely from retrograde flow. Irregular left P2 PCA with moderate to marked stenosis. Electronically Signed   By: PMacy MisM.D.   On: 05/09/2021 14:35   CT Head Wo Contrast  Result Date: 05/08/2021 CLINICAL DATA:  TIA.  Weakness and left knee pain EXAM: CT HEAD WITHOUT CONTRAST TECHNIQUE: Contiguous axial images were obtained from the base of the skull through the vertex without intravenous contrast. COMPARISON:  CT head 09/13/2017 FINDINGS: Brain: Ventricle size and cerebral volume normal for age. Ill-defined hypodensities in the right frontal white matter are new since the prior study. These are compatible with infarct of indeterminate  age, possibly subacute. Well-defined hypodensity left basal ganglia and internal capsule anteriorly compatible with chronic infarct. This was not present previously. Negative for hemorrhage or mass. Vascular: Negative for hyperdense vessel Skull: Negative Sinuses/Orbits: Negative Other: None IMPRESSION: Ill-defined hypodensities right frontal white matter compatible with ischemia of indeterminate age. Possible subacute Chronic infarct left basal ganglia No acute hemorrhage. Electronically Signed   By: CFranchot GalloM.D.   On: 05/08/2021 13:39   MR BRAIN WO CONTRAST  Result Date: 05/08/2021 CLINICAL DATA:  Weakness. EXAM: MRI HEAD WITHOUT CONTRAST TECHNIQUE: Multiplanar, multiecho pulse sequences of the brain and surrounding structures were obtained without intravenous contrast. COMPARISON:  Head CT 05/08/2021 FINDINGS: Brain: There are acute to early subacute infarcts involving  white matter in the right frontal lobe primarily at the level of the corona radiata, and there is also a single subcentimeter acute or early subacute cortical infarct laterally in the right frontal lobe. There is a chronic infarct in the left basal ganglia with associated chronic blood products. There is also a small chronic cortical infarct in the posteroinferior left occipital lobe. Mild cerebral atrophy is within normal limits for age. No mass, midline shift, or extra-axial fluid collection is evident. Vascular: Major intracranial vascular flow voids are preserved. Skull and upper cervical spine: Unremarkable bone marrow signal. Sinuses/Orbits: Unremarkable orbits. Paranasal sinuses and mastoid air cells are clear. Other: None. IMPRESSION: 1. Acute to early subacute right frontal lobe infarcts. 2. 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 Electronically Signed   By: Logan Bores M.D.   On: 05/08/2021 16:58   IR Angiogram Extremity Left  Result Date: 05/12/2021 INDICATION: TAKEIA CIARAVINO is a 70 y.o. female with a  past medical history significant for anemia, DM, HLD, CAD, HTN and prior CVA who presented to [facility] ED via EMS on 6/13 with complaints of weakness and slurred speech the day prior which had since resolved, per her daughter she had not been taking her medications as prescribed. She was noted to be hypertensive at 171/60 mmHg. Her neurologic exam was benign while in bed, however, patient presented left-sided paresthesia with ambulation. 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 CTA head showed evolving recent infarction of the right corona radiata without acute intracranial hemorrhage as well as marked stenoses of the right MCA origin and bifurcation, moderate left M2 MCA branch stenosis, diffusely irregular left vertebral artery with diminished flow and irregular left P2 PCA with moderate to marked stenosis. She has been transferred to [facility] for planned right MCA angioplasty/stent placement. EXAM: FLUOROSCOPY GUIDED VASCULAR ACCESS DIAGNOSTIC CEREBRAL ANGIOGRAM INTRACRANIAL ANGIOPLASTY AND STENTING, RIGHT MCA FLAT PANEL HEAD CT COMPARISON:  CT/CT angiogram of the head May 09, 2021 MEDICATIONS: Refer to anesthesia documentation. ANESTHESIA/SEDATION: The procedure was performed under general anesthesia CONTRAST:  120 mL of Omnipaque 240 milligram/mL FLUOROSCOPY TIME:  Fluoroscopy Time: 75 minutes 6 seconds (1,935 mGy). COMPLICATIONS: None immediate. TECHNIQUE: Informed written consent was obtained from the patient after a thorough discussion of the procedural risks, benefits and alternatives. All questions were addressed. Maximal Sterile Barrier Technique was utilized including caps, mask, sterile gowns, sterile gloves, sterile drape, hand hygiene and skin antiseptic. A timeout was performed prior to the initiation of the procedure. Using the modified Seldinger technique and a micropuncture kit, access was gained to the  distal right radial artery at the anatomical snuffbox and a 6 French sheath was placed. Real-time ultrasound guidance was utilized for vascular access including the acquisition of a permanent ultrasound image documenting patency of the accessed vessel. Slow intra arterial infusion of 5,000 IU heparin, 5 mg Verapamil and 937 mcg nitroglicerin diluted in patient's own blood was performed. No significant fluctuation in patient's blood pressure seen. Then, a right radial artery roadmap was obtained via sheath side port. Normal brachial artery branching pattern seen. No significant anatomical variation. The right radial artery caliber is adequate for vascular access. Next, a 5 [place] Simmons 2 glide catheter was navigated over a 0.035" Terumo Glidewire into the right subclavian artery under fluoroscopic guidance. Frontal angiogram of the neck was obtained. The catheter was advanced into the aortic arch. The catheter tip was  reformed. The catheter was then advanced into the left subclavian artery. Frontal angiogram of the neck was obtained. The catheter was subsequently placed into the left common carotid artery. Frontal and lateral angiograms of the neck were obtained. Under biplane roadmap, the catheter was then advanced into the left internal carotid artery. Frontal and lateral angiograms of the head were obtained. The catheter was again placed into the right subclavian artery. Frontal and lateral angiograms of the head were obtained. The the catheter was then placed clear into the right common carotid artery. Frontal and lateral angiograms of the neck were obtained. Under biplane roadmap, the the catheter was then advanced into the right internal carotid artery. Frontal and lateral angiograms of the head were obtained. FINDINGS: 1. Normal course and caliber of the cervical right vertebral artery. 2. Left subclavian artery angiograms showed no evidence of opacification of the left vertebral artery, likely related to  occlusion. 3. Mild atherosclerotic changes of the left carotid bifurcation without hemodynamically significant stenosis. 4. Severe stenosis of the P2 segment of the left fetal PCA. 5. Mild stenosis of a left M3/MCA posterior division branch. 6. No significant atherosclerotic changes or stenosis of the right carotid bifurcation. 7. Atherosclerotic changes of the right carotid siphons with mild stenosis at the paraclinoid segment. 8. Approximately 50% stenosis at the origin of the right M1/MCA segment. 9. Approximately 50% stenosis at the origin of a right inferior temporal branch. 10. Severe, hairline stenosis of the distal right M1/MCA segment, involving the origin of both anterior and posterior division branches. There is significant delay in opacification of the anterior division branches. PROCEDURE: This into catheter was exchanged over the wire and under biplane roadmap for a benchmark guide catheter which was placed in the distal cervical segment of the right ICA. Magnified frontal and lateral views of the head were obtained in the working projections. Then, attempted navigation of a 1.25 x 10 mm sapphire balloon over a 0.014 inch Aristotle microguidewire proved unsuccessful due to siphon tortuosity. A headway duo microcatheter was navigated over the Aristotle with a wire into the right M2/MCA posterior division branch, distal to the stenosis. The wire was exchanged for a zoom 14 exchange micro guidewire. The microcatheter was then exchanged for the 1.25 x 10 mm sapphire balloon. Angioplasty was performed under fluoroscopy at the distal right M1/MCA. Right internal carotid artery angiograms with frontal lateral views showed in proved flow in the distal right MCA branches. Attempted navigation of a 2 mm resolute onyx balloon mounted stent into the right M1/MCA proved unsuccessful due to siphon tortuosity. Then, a 2 x 8 mm apex balloon was navigated to the level of stenosis. Additional angioplasty was performed  under fluoroscopy. Right internal carotid angiograms with magnified frontal and lateral views showed additional improvement of the degree of stenosis. The headway duo microcatheter was again navigated over the wire into the right M2/MCA posterior division branch. Subsequently, a 3 x 24 mm neuroform atlas stent was deployed in the distal right M1/MCA segment extending to the proximal M2 posterior division branch. Follow-up angiogram showed adequate stent positioning across the stenosis with significant improvement of the degree of stenosis and anterograde flow in the posterior division branch. Persistent slow flow in the anterior division branch noted. Attempted navigation of the headway duo over the 14 wire through the stent proved unsuccessful. Attempted navigation of the apex balloon also proved unsuccessful. The 2 mm apex balloon was then navigated into the proximal right M1/MCA. Angioplasty was performed under fluoroscopy. Follow-up angiograms  showed mild improvement of the proximal right M1/MCA stenosis (now approximately 35%). Further attempts to navigate the balloon for the microcatheter through the previously deployed stent proved unsuccessful. The catheter was subsequently withdrawn. Follow-up angiograms with frontal and lateral views showed stable position and opacification of the stent. Delayed opacification of the anterior division branches noted, although improved from baseline. Flat panel CT of the head was obtained and post processed in a separate workstation with concurrent attending physician supervision. Selected images were sent to PACS. No evidence of hemorrhagic complication. IMPRESSION: 1. Severe intracranial atherosclerotic disease with severe hairline stenosis of the distal right M1/MCA segment extending into the origins of the superior and inferior divisions and severe stenosis of the left P2/PCA. Mild (50%) stenosis of the proximal right M1/MCA and at the origin of the anterior temporal  branch. 2. Angioplasty and stenting performed for treatment of the area of severe stenosis at the distal right M1/MCA segment with improvement of the anterograde flow. Persistent delayed, although improved, flow to the anterior division branch. No further angioplasty or stenting performed due to lack of access through the recently deployed stent. 3. Angioplasty performed at the proximal right M1/MCA segment with approximately 35% residual stenosis. PLAN: Continue on dual antiplatelet therapy for 6 month when stent patency should be evaluated. Electronically Signed   By: Pedro Earls M.D.   On: 05/12/2021 15:22   US Carotid Bilateral (at [facility] and [facility] only)  Result Date: 05/09/2021 CLINICAL DATA:  70 year old female with a history of CVA EXAM: BILATERAL CAROTID DUPLEX ULTRASOUND TECHNIQUE: Pearline Cables scale imaging, color Doppler and duplex ultrasound were performed of bilateral carotid and vertebral arteries in the neck. COMPARISON:  None. FINDINGS: Criteria: Quantification of carotid stenosis is based on velocity parameters that correlate the residual internal carotid diameter with NASCET-based stenosis levels, using the diameter of the distal internal carotid lumen as the denominator for stenosis measurement. The following velocity measurements were obtained: RIGHT ICA:  Systolic 712 cm/sec, Diastolic 19 cm/sec CCA:  458 cm/sec SYSTOLIC ICA/CCA RATIO:  1.5 ECA:  128 cm/sec LEFT ICA:  Systolic 95 cm/sec, Diastolic 11 cm/sec CCA:  84 cm/sec SYSTOLIC ICA/CCA RATIO:  1.1 ECA:  157 cm/sec Right Brachial SBP: Not acquired Left Brachial SBP: Not acquired RIGHT CAROTID ARTERY: No significant calcified disease of the right common carotid artery. Intermediate waveform maintained. Heterogeneous plaque without significant calcifications at the right carotid bifurcation. Low resistance waveform of the right ICA. No significant tortuosity. RIGHT VERTEBRAL ARTERY: Antegrade flow with low resistance waveform. LEFT  CAROTID ARTERY: No significant calcified disease of the left common carotid artery. Intermediate waveform maintained. Heterogeneous plaque at the left carotid bifurcation without significant calcifications. Low resistance waveform of the left ICA. LEFT VERTEBRAL ARTERY: Left vertebral artery not identified. IMPRESSION: Color duplex indicates moderate heterogeneous plaque with no hemodynamically significant stenosis by duplex criteria in the extracranial cerebrovascular circulation. Left vertebral artery waveform not identified. Signed, Dulcy Fanny. Dellia Nims, RPVI [facility] Radiology Electronically Signed   By: Corrie Mckusick D.O.   On: 05/09/2021 08:19   IR Intra Cran Stent  Result Date: 05/12/2021 INDICATION: AZURI BOZARD is a 70 y.o. female with a past medical history significant for anemia, DM, HLD, CAD, HTN and prior CVA who presented to [facility] ED via EMS on 6/13 with complaints of weakness and slurred speech the day prior which had since resolved, per her daughter she had not been taking her medications as prescribed. She was noted to  be hypertensive at 171/60 mmHg. Her neurologic exam was benign while in bed, however, patient presented left-sided paresthesia with ambulation. 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 CTA head showed evolving recent infarction of the right corona radiata without acute intracranial hemorrhage as well as marked stenoses of the right MCA origin and bifurcation, moderate left M2 MCA branch stenosis, diffusely irregular left vertebral artery with diminished flow and irregular left P2 PCA with moderate to marked stenosis. She has been transferred to [facility] for planned right MCA angioplasty/stent placement. EXAM: FLUOROSCOPY GUIDED VASCULAR ACCESS DIAGNOSTIC CEREBRAL ANGIOGRAM INTRACRANIAL ANGIOPLASTY AND STENTING, RIGHT MCA FLAT PANEL  HEAD CT COMPARISON:  CT/CT angiogram of the head May 09, 2021 MEDICATIONS: Refer to anesthesia documentation. ANESTHESIA/SEDATION: The procedure was performed under general anesthesia CONTRAST:  120 mL of Omnipaque 240 milligram/mL FLUOROSCOPY TIME:  Fluoroscopy Time: 75 minutes 6 seconds (1,935 mGy). COMPLICATIONS: None immediate. TECHNIQUE: Informed written consent was obtained from the patient after a thorough discussion of the procedural risks, benefits and alternatives. All questions were addressed. Maximal Sterile Barrier Technique was utilized including caps, mask, sterile gowns, sterile gloves, sterile drape, hand hygiene and skin antiseptic. A timeout was performed prior to the initiation of the procedure. Using the modified Seldinger technique and a micropuncture kit, access was gained to the distal right radial artery at the anatomical snuffbox and a 6 French sheath was placed. Real-time ultrasound guidance was utilized for vascular access including the acquisition of a permanent ultrasound image documenting patency of the accessed vessel. Slow intra arterial infusion of 5,000 IU heparin, 5 mg Verapamil and 542 mcg nitroglicerin diluted in patient's own blood was performed. No significant fluctuation in patient's blood pressure seen. Then, a right radial artery roadmap was obtained via sheath side port. Normal brachial artery branching pattern seen. No significant anatomical variation. The right radial artery caliber is adequate for vascular access. Next, a 5 [place] Simmons 2 glide catheter was navigated over a 0.035" Terumo Glidewire into the right subclavian artery under fluoroscopic guidance. Frontal angiogram of the neck was obtained. The catheter was advanced into the aortic arch. The catheter tip was reformed. The catheter was then advanced into the left subclavian artery. Frontal angiogram of the neck was obtained. The catheter was subsequently placed into the left common carotid artery. Frontal  and lateral angiograms of the neck were obtained. Under biplane roadmap, the catheter was then advanced into the left internal carotid artery. Frontal and lateral angiograms of the head were obtained. The catheter was again placed into the right subclavian artery. Frontal and lateral angiograms of the head were obtained. The the catheter was then placed clear into the right common carotid artery. Frontal and lateral angiograms of the neck were obtained. Under biplane roadmap, the the catheter was then advanced into the right internal carotid artery. Frontal and lateral angiograms of the head were obtained. FINDINGS: 1. Normal course and caliber of the cervical right vertebral artery. 2. Left subclavian artery angiograms showed no evidence of opacification of the left vertebral artery, likely related to occlusion. 3. Mild atherosclerotic changes of the left carotid bifurcation without hemodynamically significant stenosis. 4. Severe stenosis of the P2 segment of the left fetal PCA. 5. Mild stenosis of a left M3/MCA posterior division branch. 6. No significant atherosclerotic changes or stenosis of the right carotid bifurcation. 7. Atherosclerotic changes of the right carotid siphons with mild stenosis at the paraclinoid  segment. 8. Approximately 50% stenosis at the origin of the right M1/MCA segment. 9. Approximately 50% stenosis at the origin of a right inferior temporal branch. 10. Severe, hairline stenosis of the distal right M1/MCA segment, involving the origin of both anterior and posterior division branches. There is significant delay in opacification of the anterior division branches. PROCEDURE: This into catheter was exchanged over the wire and under biplane roadmap for a benchmark guide catheter which was placed in the distal cervical segment of the right ICA. Magnified frontal and lateral views of the head were obtained in the working projections. Then, attempted navigation of a 1.25 x 10 mm sapphire balloon  over a 0.014 inch Aristotle microguidewire proved unsuccessful due to siphon tortuosity. A headway duo microcatheter was navigated over the Aristotle with a wire into the right M2/MCA posterior division branch, distal to the stenosis. The wire was exchanged for a zoom 14 exchange micro guidewire. The microcatheter was then exchanged for the 1.25 x 10 mm sapphire balloon. Angioplasty was performed under fluoroscopy at the distal right M1/MCA. Right internal carotid artery angiograms with frontal lateral views showed in proved flow in the distal right MCA branches. Attempted navigation of a 2 mm resolute onyx balloon mounted stent into the right M1/MCA proved unsuccessful due to siphon tortuosity. Then, a 2 x 8 mm apex balloon was navigated to the level of stenosis. Additional angioplasty was performed under fluoroscopy. Right internal carotid angiograms with magnified frontal and lateral views showed additional improvement of the degree of stenosis. The headway duo microcatheter was again navigated over the wire into the right M2/MCA posterior division branch. Subsequently, a 3 x 24 mm neuroform atlas stent was deployed in the distal right M1/MCA segment extending to the proximal M2 posterior division branch. Follow-up angiogram showed adequate stent positioning across the stenosis with significant improvement of the degree of stenosis and anterograde flow in the posterior division branch. Persistent slow flow in the anterior division branch noted. Attempted navigation of the headway duo over the 14 wire through the stent proved unsuccessful. Attempted navigation of the apex balloon also proved unsuccessful. The 2 mm apex balloon was then navigated into the proximal right M1/MCA. Angioplasty was performed under fluoroscopy. Follow-up angiograms showed mild improvement of the proximal right M1/MCA stenosis (now approximately 35%). Further attempts to navigate the balloon for the microcatheter through the previously  deployed stent proved unsuccessful. The catheter was subsequently withdrawn. Follow-up angiograms with frontal and lateral views showed stable position and opacification of the stent. Delayed opacification of the anterior division branches noted, although improved from baseline. Flat panel CT of the head was obtained and post processed in a separate workstation with concurrent attending physician supervision. Selected images were sent to PACS. No evidence of hemorrhagic complication. IMPRESSION: 1. Severe intracranial atherosclerotic disease with severe hairline stenosis of the distal right M1/MCA segment extending into the origins of the superior and inferior divisions and severe stenosis of the left P2/PCA. Mild (50%) stenosis of the proximal right M1/MCA and at the origin of the anterior temporal branch. 2. Angioplasty and stenting performed for treatment of the area of severe stenosis at the distal right M1/MCA segment with improvement of the anterograde flow. Persistent delayed, although improved, flow to the anterior division branch. No further angioplasty or stenting performed due to lack of access through the recently deployed stent. 3. Angioplasty performed at the proximal right M1/MCA segment with approximately 35% residual stenosis. PLAN: Continue on dual antiplatelet therapy for 6 month when stent patency  should be evaluated. Electronically Signed   By: Pedro Earls M.D.   On: 05/12/2021 15:22   IR Intra Cran Stent  Result Date: 05/12/2021 INDICATION: MELONEY FELD is a 70 y.o. female with a past medical history significant for anemia, DM, HLD, CAD, HTN and prior CVA who presented to [facility] ED via EMS on 6/13 with complaints of weakness and slurred speech the day prior which had since resolved, per her daughter she had not been taking her medications as prescribed. She was noted to be hypertensive at 171/60 mmHg. Her neurologic exam was benign while in bed, however, patient  presented left-sided paresthesia with ambulation. 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 CTA head showed evolving recent infarction of the right corona radiata without acute intracranial hemorrhage as well as marked stenoses of the right MCA origin and bifurcation, moderate left M2 MCA branch stenosis, diffusely irregular left vertebral artery with diminished flow and irregular left P2 PCA with moderate to marked stenosis. She has been transferred to [facility] for planned right MCA angioplasty/stent placement. EXAM: FLUOROSCOPY GUIDED VASCULAR ACCESS DIAGNOSTIC CEREBRAL ANGIOGRAM INTRACRANIAL ANGIOPLASTY AND STENTING, RIGHT MCA FLAT PANEL HEAD CT COMPARISON:  CT/CT angiogram of the head May 09, 2021 MEDICATIONS: Refer to anesthesia documentation. ANESTHESIA/SEDATION: The procedure was performed under general anesthesia CONTRAST:  120 mL of Omnipaque 240 milligram/mL FLUOROSCOPY TIME:  Fluoroscopy Time: 75 minutes 6 seconds (1,935 mGy). COMPLICATIONS: None immediate. TECHNIQUE: Informed written consent was obtained from the patient after a thorough discussion of the procedural risks, benefits and alternatives. All questions were addressed. Maximal Sterile Barrier Technique was utilized including caps, mask, sterile gowns, sterile gloves, sterile drape, hand hygiene and skin antiseptic. A timeout was performed prior to the initiation of the procedure. Using the modified Seldinger technique and a micropuncture kit, access was gained to the distal right radial artery at the anatomical snuffbox and a 6 French sheath was placed. Real-time ultrasound guidance was utilized for vascular access including the acquisition of a permanent ultrasound image documenting patency of the accessed vessel. Slow intra arterial infusion of 5,000 IU heparin, 5 mg Verapamil and 371 mcg nitroglicerin diluted in patient's own blood was performed.  No significant fluctuation in patient's blood pressure seen. Then, a right radial artery roadmap was obtained via sheath side port. Normal brachial artery branching pattern seen. No significant anatomical variation. The right radial artery caliber is adequate for vascular access. Next, a 5 [place] Simmons 2 glide catheter was navigated over a 0.035" Terumo Glidewire into the right subclavian artery under fluoroscopic guidance. Frontal angiogram of the neck was obtained. The catheter was advanced into the aortic arch. The catheter tip was reformed. The catheter was then advanced into the left subclavian artery. Frontal angiogram of the neck was obtained. The catheter was subsequently placed into the left common carotid artery. Frontal and lateral angiograms of the neck were obtained. Under biplane roadmap, the catheter was then advanced into the left internal carotid artery. Frontal and lateral angiograms of the head were obtained. The catheter was again placed into the right subclavian artery. Frontal and lateral angiograms of the head were obtained. The the catheter was then placed clear into the right common carotid artery. Frontal and lateral angiograms of the neck were obtained. Under biplane roadmap, the the catheter was then advanced into the right internal carotid artery. Frontal and lateral angiograms of the head were obtained.  FINDINGS: 1. Normal course and caliber of the cervical right vertebral artery. 2. Left subclavian artery angiograms showed no evidence of opacification of the left vertebral artery, likely related to occlusion. 3. Mild atherosclerotic changes of the left carotid bifurcation without hemodynamically significant stenosis. 4. Severe stenosis of the P2 segment of the left fetal PCA. 5. Mild stenosis of a left M3/MCA posterior division branch. 6. No significant atherosclerotic changes or stenosis of the right carotid bifurcation. 7. Atherosclerotic changes of the right carotid siphons with  mild stenosis at the paraclinoid segment. 8. Approximately 50% stenosis at the origin of the right M1/MCA segment. 9. Approximately 50% stenosis at the origin of a right inferior temporal branch. 10. Severe, hairline stenosis of the distal right M1/MCA segment, involving the origin of both anterior and posterior division branches. There is significant delay in opacification of the anterior division branches. PROCEDURE: This into catheter was exchanged over the wire and under biplane roadmap for a benchmark guide catheter which was placed in the distal cervical segment of the right ICA. Magnified frontal and lateral views of the head were obtained in the working projections. Then, attempted navigation of a 1.25 x 10 mm sapphire balloon over a 0.014 inch Aristotle microguidewire proved unsuccessful due to siphon tortuosity. A headway duo microcatheter was navigated over the Aristotle with a wire into the right M2/MCA posterior division branch, distal to the stenosis. The wire was exchanged for a zoom 14 exchange micro guidewire. The microcatheter was then exchanged for the 1.25 x 10 mm sapphire balloon. Angioplasty was performed under fluoroscopy at the distal right M1/MCA. Right internal carotid artery angiograms with frontal lateral views showed in proved flow in the distal right MCA branches. Attempted navigation of a 2 mm resolute onyx balloon mounted stent into the right M1/MCA proved unsuccessful due to siphon tortuosity. Then, a 2 x 8 mm apex balloon was navigated to the level of stenosis. Additional angioplasty was performed under fluoroscopy. Right internal carotid angiograms with magnified frontal and lateral views showed additional improvement of the degree of stenosis. The headway duo microcatheter was again navigated over the wire into the right M2/MCA posterior division branch. Subsequently, a 3 x 24 mm neuroform atlas stent was deployed in the distal right M1/MCA segment extending to the proximal M2  posterior division branch. Follow-up angiogram showed adequate stent positioning across the stenosis with significant improvement of the degree of stenosis and anterograde flow in the posterior division branch. Persistent slow flow in the anterior division branch noted. Attempted navigation of the headway duo over the 14 wire through the stent proved unsuccessful. Attempted navigation of the apex balloon also proved unsuccessful. The 2 mm apex balloon was then navigated into the proximal right M1/MCA. Angioplasty was performed under fluoroscopy. Follow-up angiograms showed mild improvement of the proximal right M1/MCA stenosis (now approximately 35%). Further attempts to navigate the balloon for the microcatheter through the previously deployed stent proved unsuccessful. The catheter was subsequently withdrawn. Follow-up angiograms with frontal and lateral views showed stable position and opacification of the stent. Delayed opacification of the anterior division branches noted, although improved from baseline. Flat panel CT of the head was obtained and post processed in a separate workstation with concurrent attending physician supervision. Selected images were sent to PACS. No evidence of hemorrhagic complication. IMPRESSION: 1. Severe intracranial atherosclerotic disease with severe hairline stenosis of the distal right M1/MCA segment extending into the origins of the superior and inferior divisions and severe stenosis of the left P2/PCA. Mild (50%)  stenosis of the proximal right M1/MCA and at the origin of the anterior temporal branch. 2. Angioplasty and stenting performed for treatment of the area of severe stenosis at the distal right M1/MCA segment with improvement of the anterograde flow. Persistent delayed, although improved, flow to the anterior division branch. No further angioplasty or stenting performed due to lack of access through the recently deployed stent. 3. Angioplasty performed at the proximal  right M1/MCA segment with approximately 35% residual stenosis. PLAN: Continue on dual antiplatelet therapy for 6 month when stent patency should be evaluated. Electronically Signed   By: Pedro Earls M.D.   On: 05/12/2021 15:22   IR CT Head Ltd  Result Date: 05/12/2021 INDICATION: CICLALY MULCAHEY is a 70 y.o. female with a past medical history significant for anemia, DM, HLD, CAD, HTN and prior CVA who presented to [facility] ED via EMS on 6/13 with complaints of weakness and slurred speech the day prior which had since resolved, per her daughter she had not been taking her medications as prescribed. She was noted to be hypertensive at 171/60 mmHg. Her neurologic exam was benign while in bed, however, patient presented left-sided paresthesia with ambulation. 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 CTA head showed evolving recent infarction of the right corona radiata without acute intracranial hemorrhage as well as marked stenoses of the right MCA origin and bifurcation, moderate left M2 MCA branch stenosis, diffusely irregular left vertebral artery with diminished flow and irregular left P2 PCA with moderate to marked stenosis. She has been transferred to [facility] for planned right MCA angioplasty/stent placement. EXAM: FLUOROSCOPY GUIDED VASCULAR ACCESS DIAGNOSTIC CEREBRAL ANGIOGRAM INTRACRANIAL ANGIOPLASTY AND STENTING, RIGHT MCA FLAT PANEL HEAD CT COMPARISON:  CT/CT angiogram of the head May 09, 2021 MEDICATIONS: Refer to anesthesia documentation. ANESTHESIA/SEDATION: The procedure was performed under general anesthesia CONTRAST:  120 mL of Omnipaque 240 milligram/mL FLUOROSCOPY TIME:  Fluoroscopy Time: 75 minutes 6 seconds (1,935 mGy). COMPLICATIONS: None immediate. TECHNIQUE: Informed written consent was obtained from the patient after a thorough discussion of the procedural risks, benefits and  alternatives. All questions were addressed. Maximal Sterile Barrier Technique was utilized including caps, mask, sterile gowns, sterile gloves, sterile drape, hand hygiene and skin antiseptic. A timeout was performed prior to the initiation of the procedure. Using the modified Seldinger technique and a micropuncture kit, access was gained to the distal right radial artery at the anatomical snuffbox and a 6 French sheath was placed. Real-time ultrasound guidance was utilized for vascular access including the acquisition of a permanent ultrasound image documenting patency of the accessed vessel. Slow intra arterial infusion of 5,000 IU heparin, 5 mg Verapamil and 734 mcg nitroglicerin diluted in patient's own blood was performed. No significant fluctuation in patient's blood pressure seen. Then, a right radial artery roadmap was obtained via sheath side port. Normal brachial artery branching pattern seen. No significant anatomical variation. The right radial artery caliber is adequate for vascular access. Next, a 5 [place] Simmons 2 glide catheter was navigated over a 0.035" Terumo Glidewire into the right subclavian artery under fluoroscopic guidance. Frontal angiogram of the neck was obtained. The catheter was advanced into the aortic arch. The catheter tip was reformed. The catheter was then advanced into the left subclavian artery. Frontal angiogram of the neck was obtained. The catheter was subsequently placed into the left common carotid artery. Frontal and lateral angiograms of  the neck were obtained. Under biplane roadmap, the catheter was then advanced into the left internal carotid artery. Frontal and lateral angiograms of the head were obtained. The catheter was again placed into the right subclavian artery. Frontal and lateral angiograms of the head were obtained. The the catheter was then placed clear into the right common carotid artery. Frontal and lateral angiograms of the neck were obtained. Under  biplane roadmap, the the catheter was then advanced into the right internal carotid artery. Frontal and lateral angiograms of the head were obtained. FINDINGS: 1. Normal course and caliber of the cervical right vertebral artery. 2. Left subclavian artery angiograms showed no evidence of opacification of the left vertebral artery, likely related to occlusion. 3. Mild atherosclerotic changes of the left carotid bifurcation without hemodynamically significant stenosis. 4. Severe stenosis of the P2 segment of the left fetal PCA. 5. Mild stenosis of a left M3/MCA posterior division branch. 6. No significant atherosclerotic changes or stenosis of the right carotid bifurcation. 7. Atherosclerotic changes of the right carotid siphons with mild stenosis at the paraclinoid segment. 8. Approximately 50% stenosis at the origin of the right M1/MCA segment. 9. Approximately 50% stenosis at the origin of a right inferior temporal branch. 10. Severe, hairline stenosis of the distal right M1/MCA segment, involving the origin of both anterior and posterior division branches. There is significant delay in opacification of the anterior division branches. PROCEDURE: This into catheter was exchanged over the wire and under biplane roadmap for a benchmark guide catheter which was placed in the distal cervical segment of the right ICA. Magnified frontal and lateral views of the head were obtained in the working projections. Then, attempted navigation of a 1.25 x 10 mm sapphire balloon over a 0.014 inch Aristotle microguidewire proved unsuccessful due to siphon tortuosity. A headway duo microcatheter was navigated over the Aristotle with a wire into the right M2/MCA posterior division branch, distal to the stenosis. The wire was exchanged for a zoom 14 exchange micro guidewire. The microcatheter was then exchanged for the 1.25 x 10 mm sapphire balloon. Angioplasty was performed under fluoroscopy at the distal right M1/MCA. Right internal  carotid artery angiograms with frontal lateral views showed in proved flow in the distal right MCA branches. Attempted navigation of a 2 mm resolute onyx balloon mounted stent into the right M1/MCA proved unsuccessful due to siphon tortuosity. Then, a 2 x 8 mm apex balloon was navigated to the level of stenosis. Additional angioplasty was performed under fluoroscopy. Right internal carotid angiograms with magnified frontal and lateral views showed additional improvement of the degree of stenosis. The headway duo microcatheter was again navigated over the wire into the right M2/MCA posterior division branch. Subsequently, a 3 x 24 mm neuroform atlas stent was deployed in the distal right M1/MCA segment extending to the proximal M2 posterior division branch. Follow-up angiogram showed adequate stent positioning across the stenosis with significant improvement of the degree of stenosis and anterograde flow in the posterior division branch. Persistent slow flow in the anterior division branch noted. Attempted navigation of the headway duo over the 14 wire through the stent proved unsuccessful. Attempted navigation of the apex balloon also proved unsuccessful. The 2 mm apex balloon was then navigated into the proximal right M1/MCA. Angioplasty was performed under fluoroscopy. Follow-up angiograms showed mild improvement of the proximal right M1/MCA stenosis (now approximately 35%). Further attempts to navigate the balloon for the microcatheter through the previously deployed stent proved unsuccessful. The catheter was subsequently withdrawn. Follow-up  angiograms with frontal and lateral views showed stable position and opacification of the stent. Delayed opacification of the anterior division branches noted, although improved from baseline. Flat panel CT of the head was obtained and post processed in a separate workstation with concurrent attending physician supervision. Selected images were sent to PACS. No evidence of  hemorrhagic complication. IMPRESSION: 1. Severe intracranial atherosclerotic disease with severe hairline stenosis of the distal right M1/MCA segment extending into the origins of the superior and inferior divisions and severe stenosis of the left P2/PCA. Mild (50%) stenosis of the proximal right M1/MCA and at the origin of the anterior temporal branch. 2. Angioplasty and stenting performed for treatment of the area of severe stenosis at the distal right M1/MCA segment with improvement of the anterograde flow. Persistent delayed, although improved, flow to the anterior division branch. No further angioplasty or stenting performed due to lack of access through the recently deployed stent. 3. Angioplasty performed at the proximal right M1/MCA segment with approximately 35% residual stenosis. PLAN: Continue on dual antiplatelet therapy for 6 month when stent patency should be evaluated. Electronically Signed   By: Pedro Earls M.D.   On: 05/12/2021 15:22   IR US Guide Vasc Access Right  Result Date: 05/12/2021 INDICATION: GERILYN STARGELL is a 70 y.o. female with a past medical history significant for anemia, DM, HLD, CAD, HTN and prior CVA who presented to [facility] ED via EMS on 6/13 with complaints of weakness and slurred speech the day prior which had since resolved, per her daughter she had not been taking her medications as prescribed. She was noted to be hypertensive at 171/60 mmHg. Her neurologic exam was benign while in bed, however, patient presented left-sided paresthesia with ambulation. 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 CTA head showed evolving recent infarction of the right corona radiata without acute intracranial hemorrhage as well as marked stenoses of the right MCA origin and bifurcation, moderate left M2 MCA branch stenosis, diffusely irregular left vertebral artery with  diminished flow and irregular left P2 PCA with moderate to marked stenosis. She has been transferred to [facility] for planned right MCA angioplasty/stent placement. EXAM: FLUOROSCOPY GUIDED VASCULAR ACCESS DIAGNOSTIC CEREBRAL ANGIOGRAM INTRACRANIAL ANGIOPLASTY AND STENTING, RIGHT MCA FLAT PANEL HEAD CT COMPARISON:  CT/CT angiogram of the head May 09, 2021 MEDICATIONS: Refer to anesthesia documentation. ANESTHESIA/SEDATION: The procedure was performed under general anesthesia CONTRAST:  120 mL of Omnipaque 240 milligram/mL FLUOROSCOPY TIME:  Fluoroscopy Time: 75 minutes 6 seconds (1,935 mGy). COMPLICATIONS: None immediate. TECHNIQUE: Informed written consent was obtained from the patient after a thorough discussion of the procedural risks, benefits and alternatives. All questions were addressed. Maximal Sterile Barrier Technique was utilized including caps, mask, sterile gowns, sterile gloves, sterile drape, hand hygiene and skin antiseptic. A timeout was performed prior to the initiation of the procedure. Using the modified Seldinger technique and a micropuncture kit, access was gained to the distal right radial artery at the anatomical snuffbox and a 6 French sheath was placed. Real-time ultrasound guidance was utilized for vascular access including the acquisition of a permanent ultrasound image documenting patency of the accessed vessel. Slow intra arterial infusion of 5,000 IU heparin, 5 mg Verapamil and 388 mcg nitroglicerin diluted in patient's own blood was performed. No significant fluctuation in patient's blood pressure seen. Then, a right radial artery roadmap was obtained via sheath side port. Normal brachial  artery branching pattern seen. No significant anatomical variation. The right radial artery caliber is adequate for vascular access. Next, a 5 [place] Simmons 2 glide catheter was navigated over a 0.035" Terumo Glidewire into the right subclavian artery under fluoroscopic guidance. Frontal angiogram of the  neck was obtained. The catheter was advanced into the aortic arch. The catheter tip was reformed. The catheter was then advanced into the left subclavian artery. Frontal angiogram of the neck was obtained. The catheter was subsequently placed into the left common carotid artery. Frontal and lateral angiograms of the neck were obtained. Under biplane roadmap, the catheter was then advanced into the left internal carotid artery. Frontal and lateral angiograms of the head were obtained. The catheter was again placed into the right subclavian artery. Frontal and lateral angiograms of the head were obtained. The the catheter was then placed clear into the right common carotid artery. Frontal and lateral angiograms of the neck were obtained. Under biplane roadmap, the the catheter was then advanced into the right internal carotid artery. Frontal and lateral angiograms of the head were obtained. FINDINGS: 1. Normal course and caliber of the cervical right vertebral artery. 2. Left subclavian artery angiograms showed no evidence of opacification of the left vertebral artery, likely related to occlusion. 3. Mild atherosclerotic changes of the left carotid bifurcation without hemodynamically significant stenosis. 4. Severe stenosis of the P2 segment of the left fetal PCA. 5. Mild stenosis of a left M3/MCA posterior division branch. 6. No significant atherosclerotic changes or stenosis of the right carotid bifurcation. 7. Atherosclerotic changes of the right carotid siphons with mild stenosis at the paraclinoid segment. 8. Approximately 50% stenosis at the origin of the right M1/MCA segment. 9. Approximately 50% stenosis at the origin of a right inferior temporal branch. 10. Severe, hairline stenosis of the distal right M1/MCA segment, involving the origin of both anterior and posterior division branches. There is significant delay in opacification of the anterior division branches. PROCEDURE: This into catheter was exchanged  over the wire and under biplane roadmap for a benchmark guide catheter which was placed in the distal cervical segment of the right ICA. Magnified frontal and lateral views of the head were obtained in the working projections. Then, attempted navigation of a 1.25 x 10 mm sapphire balloon over a 0.014 inch Aristotle microguidewire proved unsuccessful due to siphon tortuosity. A headway duo microcatheter was navigated over the Aristotle with a wire into the right M2/MCA posterior division branch, distal to the stenosis. The wire was exchanged for a zoom 14 exchange micro guidewire. The microcatheter was then exchanged for the 1.25 x 10 mm sapphire balloon. Angioplasty was performed under fluoroscopy at the distal right M1/MCA. Right internal carotid artery angiograms with frontal lateral views showed in proved flow in the distal right MCA branches. Attempted navigation of a 2 mm resolute onyx balloon mounted stent into the right M1/MCA proved unsuccessful due to siphon tortuosity. Then, a 2 x 8 mm apex balloon was navigated to the level of stenosis. Additional angioplasty was performed under fluoroscopy. Right internal carotid angiograms with magnified frontal and lateral views showed additional improvement of the degree of stenosis. The headway duo microcatheter was again navigated over the wire into the right M2/MCA posterior division branch. Subsequently, a 3 x 24 mm neuroform atlas stent was deployed in the distal right M1/MCA segment extending to the proximal M2 posterior division branch. Follow-up angiogram showed adequate stent positioning across the stenosis with significant improvement of the degree of stenosis and  anterograde flow in the posterior division branch. Persistent slow flow in the anterior division branch noted. Attempted navigation of the headway duo over the 14 wire through the stent proved unsuccessful. Attempted navigation of the apex balloon also proved unsuccessful. The 2 mm apex balloon was  then navigated into the proximal right M1/MCA. Angioplasty was performed under fluoroscopy. Follow-up angiograms showed mild improvement of the proximal right M1/MCA stenosis (now approximately 35%). Further attempts to navigate the balloon for the microcatheter through the previously deployed stent proved unsuccessful. The catheter was subsequently withdrawn. Follow-up angiograms with frontal and lateral views showed stable position and opacification of the stent. Delayed opacification of the anterior division branches noted, although improved from baseline. Flat panel CT of the head was obtained and post processed in a separate workstation with concurrent attending physician supervision. Selected images were sent to PACS. No evidence of hemorrhagic complication. IMPRESSION: 1. Severe intracranial atherosclerotic disease with severe hairline stenosis of the distal right M1/MCA segment extending into the origins of the superior and inferior divisions and severe stenosis of the left P2/PCA. Mild (50%) stenosis of the proximal right M1/MCA and at the origin of the anterior temporal branch. 2. Angioplasty and stenting performed for treatment of the area of severe stenosis at the distal right M1/MCA segment with improvement of the anterograde flow. Persistent delayed, although improved, flow to the anterior division branch. No further angioplasty or stenting performed due to lack of access through the recently deployed stent. 3. Angioplasty performed at the proximal right M1/MCA segment with approximately 35% residual stenosis. PLAN: Continue on dual antiplatelet therapy for 6 month when stent patency should be evaluated. Electronically Signed   By: Pedro Earls M.D.   On: 05/12/2021 15:22   DG Knee Complete 4 Views Left  Result Date: 05/08/2021 CLINICAL DATA:  Recent fall with left knee pain, initial encounter EXAM: LEFT KNEE - COMPLETE 4+ VIEW COMPARISON:  None. FINDINGS: No evidence of fracture,  dislocation, or joint effusion. No evidence of arthropathy or other focal bone abnormality. Soft tissues are unremarkable. IMPRESSION: No acute abnormality noted. Electronically Signed   By: Inez Catalina M.D.   On: 05/08/2021 15:32   ECHOCARDIOGRAM COMPLETE  Result Date: 05/09/2021    ECHOCARDIOGRAM REPORT   Patient Name:   ALVINA STROTHER Mcdougall Date of Exam: 05/09/2021 Medical Rec #:  [id number]        Height:       62.0 in Accession #:    [id number]       Weight:       175.5 lb Date of Birth:  1951-09-16       BSA:          1.808 m Patient Age:    28 years         BP:           179/73 mmHg Patient Gender: F                HR:           73 bpm. Exam Location:  [facility] Procedure: 2D Echo, Cardiac Doppler, Color Doppler and Saline Contrast Bubble            Study Indications:     Stroke I63.9  History:         Patient has no prior history of Echocardiogram examinations.                  Stroke; Risk Factors:Diabetes.  Sonographer:  Sherrie Sport RDCS (AE) Referring Phys:  [place] Diagnosing Phys: Ida Rogue MD IMPRESSIONS  1. Left ventricular ejection fraction, by estimation, is 60 to 65%. The left ventricle has normal function. The left ventricle has no regional wall motion abnormalities. Left ventricular diastolic parameters are indeterminate.  2. Right ventricular systolic function is normal. The right ventricular size is normal. FINDINGS  Left Ventricle: Left ventricular ejection fraction, by estimation, is 60 to 65%. The left ventricle has normal function. The left ventricle has no regional wall motion abnormalities. The left ventricular internal cavity size was normal in size. There is  no left ventricular hypertrophy. Left ventricular diastolic parameters are indeterminate. Right Ventricle: The right ventricular size is normal. No increase in right ventricular wall thickness. Right ventricular systolic function is normal. Left Atrium: Left atrial size was normal in size. Right Atrium: Right atrial  size was normal in size. Pericardium: There is no evidence of pericardial effusion. Mitral Valve: The mitral valve was not well visualized. No evidence of mitral valve regurgitation. No evidence of mitral valve stenosis. Tricuspid Valve: The tricuspid valve is not well visualized. Tricuspid valve regurgitation is not demonstrated. No evidence of tricuspid stenosis. Aortic Valve: The aortic valve was not well visualized. Aortic valve regurgitation is not visualized. No aortic stenosis is present. Pulmonic Valve: The pulmonic valve was not well visualized. Pulmonic valve regurgitation is not visualized. No evidence of pulmonic stenosis. Aorta: The aortic root is normal in size and structure. Venous: The inferior vena cava is normal in size with greater than 50% respiratory variability, suggesting right atrial pressure of 3 mmHg. IAS/Shunts: No atrial level shunt detected by color flow Doppler. Agitated saline contrast was given intravenously to evaluate for intracardiac shunting.  LEFT VENTRICLE PLAX 2D LVIDd:         3.70 cm LVIDs:         2.30 cm LV PW:         1.20 cm LV IVS:        1.30 cm LVOT diam:     2.10 cm LVOT Area:     3.46 cm  LEFT ATRIUM         Index LA diam:    3.20 cm 1.77 cm/m                        PULMONIC VALVE AORTA                 PV Vmax:        0.99 m/s Ao Root diam: 3.20 cm PV Peak grad:   3.9 mmHg                       RVOT Peak grad: 5 mmHg   SHUNTS Systemic Diam: 2.10 cm Ida Rogue MD Electronically signed by Ida Rogue MD Signature Date/Time: 05/09/2021/5:06:01 PM    Final    IR ANGIO INTRA EXTRACRAN SEL COM CAROTID INNOMINATE UNI L MOD SED  Result Date: 05/12/2021 INDICATION: TEALA DAFFRON is a 70 y.o. female with a past medical history significant for anemia, DM, HLD, CAD, HTN and prior CVA who presented to [facility] ED via EMS on 6/13 with complaints of weakness and slurred speech the day prior which had since resolved, per her daughter she had not been taking her  medications as prescribed. She was noted to be hypertensive at 171/60 mmHg. Her neurologic exam was benign while in bed, however, patient presented  left-sided paresthesia with ambulation. 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 CTA head showed evolving recent infarction of the right corona radiata without acute intracranial hemorrhage as well as marked stenoses of the right MCA origin and bifurcation, moderate left M2 MCA branch stenosis, diffusely irregular left vertebral artery with diminished flow and irregular left P2 PCA with moderate to marked stenosis. She has been transferred to [facility] for planned right MCA angioplasty/stent placement. EXAM: FLUOROSCOPY GUIDED VASCULAR ACCESS DIAGNOSTIC CEREBRAL ANGIOGRAM INTRACRANIAL ANGIOPLASTY AND STENTING, RIGHT MCA FLAT PANEL HEAD CT COMPARISON:  CT/CT angiogram of the head May 09, 2021 MEDICATIONS: Refer to anesthesia documentation. ANESTHESIA/SEDATION: The procedure was performed under general anesthesia CONTRAST:  120 mL of Omnipaque 240 milligram/mL FLUOROSCOPY TIME:  Fluoroscopy Time: 75 minutes 6 seconds (1,935 mGy). COMPLICATIONS: None immediate. TECHNIQUE: Informed written consent was obtained from the patient after a thorough discussion of the procedural risks, benefits and alternatives. All questions were addressed. Maximal Sterile Barrier Technique was utilized including caps, mask, sterile gowns, sterile gloves, sterile drape, hand hygiene and skin antiseptic. A timeout was performed prior to the initiation of the procedure. Using the modified Seldinger technique and a micropuncture kit, access was gained to the distal right radial artery at the anatomical snuffbox and a 6 French sheath was placed. Real-time ultrasound guidance was utilized for vascular access including the acquisition of a permanent ultrasound image documenting patency of the accessed  vessel. Slow intra arterial infusion of 5,000 IU heparin, 5 mg Verapamil and 219 mcg nitroglicerin diluted in patient's own blood was performed. No significant fluctuation in patient's blood pressure seen. Then, a right radial artery roadmap was obtained via sheath side port. Normal brachial artery branching pattern seen. No significant anatomical variation. The right radial artery caliber is adequate for vascular access. Next, a 5 [place] Simmons 2 glide catheter was navigated over a 0.035" Terumo Glidewire into the right subclavian artery under fluoroscopic guidance. Frontal angiogram of the neck was obtained. The catheter was advanced into the aortic arch. The catheter tip was reformed. The catheter was then advanced into the left subclavian artery. Frontal angiogram of the neck was obtained. The catheter was subsequently placed into the left common carotid artery. Frontal and lateral angiograms of the neck were obtained. Under biplane roadmap, the catheter was then advanced into the left internal carotid artery. Frontal and lateral angiograms of the head were obtained. The catheter was again placed into the right subclavian artery. Frontal and lateral angiograms of the head were obtained. The the catheter was then placed clear into the right common carotid artery. Frontal and lateral angiograms of the neck were obtained. Under biplane roadmap, the the catheter was then advanced into the right internal carotid artery. Frontal and lateral angiograms of the head were obtained. FINDINGS: 1. Normal course and caliber of the cervical right vertebral artery. 2. Left subclavian artery angiograms showed no evidence of opacification of the left vertebral artery, likely related to occlusion. 3. Mild atherosclerotic changes of the left carotid bifurcation without hemodynamically significant stenosis. 4. Severe stenosis of the P2 segment of the left fetal PCA. 5. Mild stenosis of a left M3/MCA posterior division branch. 6. No  significant atherosclerotic changes or stenosis of the right carotid bifurcation. 7. Atherosclerotic changes of the right carotid siphons with mild stenosis at the paraclinoid segment. 8. Approximately 50% stenosis at the origin of the right M1/MCA segment. 9. Approximately 50%  stenosis at the origin of a right inferior temporal branch. 10. Severe, hairline stenosis of the distal right M1/MCA segment, involving the origin of both anterior and posterior division branches. There is significant delay in opacification of the anterior division branches. PROCEDURE: This into catheter was exchanged over the wire and under biplane roadmap for a benchmark guide catheter which was placed in the distal cervical segment of the right ICA. Magnified frontal and lateral views of the head were obtained in the working projections. Then, attempted navigation of a 1.25 x 10 mm sapphire balloon over a 0.014 inch Aristotle microguidewire proved unsuccessful due to siphon tortuosity. A headway duo microcatheter was navigated over the Aristotle with a wire into the right M2/MCA posterior division branch, distal to the stenosis. The wire was exchanged for a zoom 14 exchange micro guidewire. The microcatheter was then exchanged for the 1.25 x 10 mm sapphire balloon. Angioplasty was performed under fluoroscopy at the distal right M1/MCA. Right internal carotid artery angiograms with frontal lateral views showed in proved flow in the distal right MCA branches. Attempted navigation of a 2 mm resolute onyx balloon mounted stent into the right M1/MCA proved unsuccessful due to siphon tortuosity. Then, a 2 x 8 mm apex balloon was navigated to the level of stenosis. Additional angioplasty was performed under fluoroscopy. Right internal carotid angiograms with magnified frontal and lateral views showed additional improvement of the degree of stenosis. The headway duo microcatheter was again navigated over the wire into the right M2/MCA posterior  division branch. Subsequently, a 3 x 24 mm neuroform atlas stent was deployed in the distal right M1/MCA segment extending to the proximal M2 posterior division branch. Follow-up angiogram showed adequate stent positioning across the stenosis with significant improvement of the degree of stenosis and anterograde flow in the posterior division branch. Persistent slow flow in the anterior division branch noted. Attempted navigation of the headway duo over the 14 wire through the stent proved unsuccessful. Attempted navigation of the apex balloon also proved unsuccessful. The 2 mm apex balloon was then navigated into the proximal right M1/MCA. Angioplasty was performed under fluoroscopy. Follow-up angiograms showed mild improvement of the proximal right M1/MCA stenosis (now approximately 35%). Further attempts to navigate the balloon for the microcatheter through the previously deployed stent proved unsuccessful. The catheter was subsequently withdrawn. Follow-up angiograms with frontal and lateral views showed stable position and opacification of the stent. Delayed opacification of the anterior division branches noted, although improved from baseline. Flat panel CT of the head was obtained and post processed in a separate workstation with concurrent attending physician supervision. Selected images were sent to PACS. No evidence of hemorrhagic complication. IMPRESSION: 1. Severe intracranial atherosclerotic disease with severe hairline stenosis of the distal right M1/MCA segment extending into the origins of the superior and inferior divisions and severe stenosis of the left P2/PCA. Mild (50%) stenosis of the proximal right M1/MCA and at the origin of the anterior temporal branch. 2. Angioplasty and stenting performed for treatment of the area of severe stenosis at the distal right M1/MCA segment with improvement of the anterograde flow. Persistent delayed, although improved, flow to the anterior division branch. No  further angioplasty or stenting performed due to lack of access through the recently deployed stent. 3. Angioplasty performed at the proximal right M1/MCA segment with approximately 35% residual stenosis. PLAN: Continue on dual antiplatelet therapy for 6 month when stent patency should be evaluated. Electronically Signed   By: Sandre Kitty.D.  On: 05/12/2021 15:22   IR ANGIO VERTEBRAL SEL VERTEBRAL UNI R MOD SED  Result Date: 05/12/2021 INDICATION: TONIETTE DEVERA is a 70 y.o. female with a past medical history significant for anemia, DM, HLD, CAD, HTN and prior CVA who presented to [facility] ED via EMS on 6/13 with complaints of weakness and slurred speech the day prior which had since resolved, per her daughter she had not been taking her medications as prescribed. She was noted to be hypertensive at 171/60 mmHg. Her neurologic exam was benign while in bed, however, patient presented left-sided paresthesia with ambulation. 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 CTA head showed evolving recent infarction of the right corona radiata without acute intracranial hemorrhage as well as marked stenoses of the right MCA origin and bifurcation, moderate left M2 MCA branch stenosis, diffusely irregular left vertebral artery with diminished flow and irregular left P2 PCA with moderate to marked stenosis. She has been transferred to [facility] for planned right MCA angioplasty/stent placement. EXAM: FLUOROSCOPY GUIDED VASCULAR ACCESS DIAGNOSTIC CEREBRAL ANGIOGRAM INTRACRANIAL ANGIOPLASTY AND STENTING, RIGHT MCA FLAT PANEL HEAD CT COMPARISON:  CT/CT angiogram of the head May 09, 2021 MEDICATIONS: Refer to anesthesia documentation. ANESTHESIA/SEDATION: The procedure was performed under general anesthesia CONTRAST:  120 mL of Omnipaque 240 milligram/mL FLUOROSCOPY TIME:  Fluoroscopy Time: 75 minutes 6 seconds  (1,935 mGy). COMPLICATIONS: None immediate. TECHNIQUE: Informed written consent was obtained from the patient after a thorough discussion of the procedural risks, benefits and alternatives. All questions were addressed. Maximal Sterile Barrier Technique was utilized including caps, mask, sterile gowns, sterile gloves, sterile drape, hand hygiene and skin antiseptic. A timeout was performed prior to the initiation of the procedure. Using the modified Seldinger technique and a micropuncture kit, access was gained to the distal right radial artery at the anatomical snuffbox and a 6 French sheath was placed. Real-time ultrasound guidance was utilized for vascular access including the acquisition of a permanent ultrasound image documenting patency of the accessed vessel. Slow intra arterial infusion of 5,000 IU heparin, 5 mg Verapamil and 093 mcg nitroglicerin diluted in patient's own blood was performed. No significant fluctuation in patient's blood pressure seen. Then, a right radial artery roadmap was obtained via sheath side port. Normal brachial artery branching pattern seen. No significant anatomical variation. The right radial artery caliber is adequate for vascular access. Next, a 5 [place] Simmons 2 glide catheter was navigated over a 0.035" Terumo Glidewire into the right subclavian artery under fluoroscopic guidance. Frontal angiogram of the neck was obtained. The catheter was advanced into the aortic arch. The catheter tip was reformed. The catheter was then advanced into the left subclavian artery. Frontal angiogram of the neck was obtained. The catheter was subsequently placed into the left common carotid artery. Frontal and lateral angiograms of the neck were obtained. Under biplane roadmap, the catheter was then advanced into the left internal carotid artery. Frontal and lateral angiograms of the head were obtained. The catheter was again placed into the right subclavian artery. Frontal and lateral  angiograms of the head were obtained. The the catheter was then placed clear into the right common carotid artery. Frontal and lateral angiograms of the neck were obtained. Under biplane roadmap, the the catheter was then advanced into the right internal carotid artery. Frontal and lateral angiograms of the head were obtained. FINDINGS: 1. Normal course and caliber of the cervical right vertebral  artery. 2. Left subclavian artery angiograms showed no evidence of opacification of the left vertebral artery, likely related to occlusion. 3. Mild atherosclerotic changes of the left carotid bifurcation without hemodynamically significant stenosis. 4. Severe stenosis of the P2 segment of the left fetal PCA. 5. Mild stenosis of a left M3/MCA posterior division branch. 6. No significant atherosclerotic changes or stenosis of the right carotid bifurcation. 7. Atherosclerotic changes of the right carotid siphons with mild stenosis at the paraclinoid segment. 8. Approximately 50% stenosis at the origin of the right M1/MCA segment. 9. Approximately 50% stenosis at the origin of a right inferior temporal branch. 10. Severe, hairline stenosis of the distal right M1/MCA segment, involving the origin of both anterior and posterior division branches. There is significant delay in opacification of the anterior division branches. PROCEDURE: This into catheter was exchanged over the wire and under biplane roadmap for a benchmark guide catheter which was placed in the distal cervical segment of the right ICA. Magnified frontal and lateral views of the head were obtained in the working projections. Then, attempted navigation of a 1.25 x 10 mm sapphire balloon over a 0.014 inch Aristotle microguidewire proved unsuccessful due to siphon tortuosity. A headway duo microcatheter was navigated over the Aristotle with a wire into the right M2/MCA posterior division branch, distal to the stenosis. The wire was exchanged for a zoom 14 exchange  micro guidewire. The microcatheter was then exchanged for the 1.25 x 10 mm sapphire balloon. Angioplasty was performed under fluoroscopy at the distal right M1/MCA. Right internal carotid artery angiograms with frontal lateral views showed in proved flow in the distal right MCA branches. Attempted navigation of a 2 mm resolute onyx balloon mounted stent into the right M1/MCA proved unsuccessful due to siphon tortuosity. Then, a 2 x 8 mm apex balloon was navigated to the level of stenosis. Additional angioplasty was performed under fluoroscopy. Right internal carotid angiograms with magnified frontal and lateral views showed additional improvement of the degree of stenosis. The headway duo microcatheter was again navigated over the wire into the right M2/MCA posterior division branch. Subsequently, a 3 x 24 mm neuroform atlas stent was deployed in the distal right M1/MCA segment extending to the proximal M2 posterior division branch. Follow-up angiogram showed adequate stent positioning across the stenosis with significant improvement of the degree of stenosis and anterograde flow in the posterior division branch. Persistent slow flow in the anterior division branch noted. Attempted navigation of the headway duo over the 14 wire through the stent proved unsuccessful. Attempted navigation of the apex balloon also proved unsuccessful. The 2 mm apex balloon was then navigated into the proximal right M1/MCA. Angioplasty was performed under fluoroscopy. Follow-up angiograms showed mild improvement of the proximal right M1/MCA stenosis (now approximately 35%). Further attempts to navigate the balloon for the microcatheter through the previously deployed stent proved unsuccessful. The catheter was subsequently withdrawn. Follow-up angiograms with frontal and lateral views showed stable position and opacification of the stent. Delayed opacification of the anterior division branches noted, although improved from baseline.  Flat panel CT of the head was obtained and post processed in a separate workstation with concurrent attending physician supervision. Selected images were sent to PACS. No evidence of hemorrhagic complication. IMPRESSION: 1. Severe intracranial atherosclerotic disease with severe hairline stenosis of the distal right M1/MCA segment extending into the origins of the superior and inferior divisions and severe stenosis of the left P2/PCA. Mild (50%) stenosis of the proximal right M1/MCA and at the origin of  the anterior temporal branch. 2. Angioplasty and stenting performed for treatment of the area of severe stenosis at the distal right M1/MCA segment with improvement of the anterograde flow. Persistent delayed, although improved, flow to the anterior division branch. No further angioplasty or stenting performed due to lack of access through the recently deployed stent. 3. Angioplasty performed at the proximal right M1/MCA segment with approximately 35% residual stenosis. PLAN: Continue on dual antiplatelet therapy for 6 month when stent patency should be evaluated. Electronically Signed   By: Pedro Earls M.D.   On: 05/12/2021 15:22    Subjective:   Patient was seen and examined 05/14/2021, 11:43 AM Patient stable today. No acute distress.  No issues overnight Stable for discharge.  Discharge Exam:    Vitals:   05/14/21 0800 05/14/21 0900 05/14/21 1000 05/14/21 1100  BP: (!) 158/60 (!) 127/45 (!) 139/41 (!) 136/50  Pulse: 60 66 (!) 58 69  Resp: _0 Temp: 97.8 F (36.6 C)     TempSrc: Oral     SpO2: 96% 98% 99% 96%  Weight:      Height:        General: Pt lying comfortably in bed & appears in no obvious distress. Cardiovascular: S1 & S2 heard, RRR, S1/S2 +. No murmurs, rubs, gallops or clicks. No JVD or pedal edema. Respiratory: Clear to auscultation without wheezing, rhonchi or crackles. No increased work of breathing. Abdominal:  Non-distended, non-tender & soft. No  organomegaly or masses appreciated. Normal bowel sounds heard. CNS: Alert and oriented. No focal deficits. Extremities: no edema, no cyanosis      The results of significant diagnostics from this hospitalization (including imaging, microbiology, ancillary and laboratory) are listed below for reference.      Microbiology:   Recent Results (from the past 240 hour(s))  Resp Panel by RT-PCR (Flu A&B, Covid) Nasopharyngeal Swab     Status: None   Collection Time: 05/08/21  2:19 PM   Specimen: Nasopharyngeal Swab; Nasopharyngeal(NP) swabs in vial transport medium  Result Value Ref Range Status   SARS Coronavirus 2 by RT PCR NEGATIVE NEGATIVE Final    Comment: (NOTE) SARS-CoV-2 target nucleic acids are NOT DETECTED.  The SARS-CoV-2 RNA is generally detectable in upper respiratory specimens during the acute phase of infection. The lowest concentration of SARS-CoV-2 viral copies this assay can detect is 138 copies/mL. A negative result does not preclude SARS-Cov-2 infection and should not be used as the sole basis for treatment or other patient management decisions. A negative result may occur with  improper specimen collection/handling, submission of specimen other than nasopharyngeal swab, presence of viral mutation(s) within the areas targeted by this assay, and inadequate number of viral copies(<138 copies/mL). A negative result must be combined with clinical observations, patient history, and epidemiological information. The expected result is Negative.  Fact Sheet for Patients:  EntrepreneurPulse.com.au  Fact Sheet for Healthcare Providers:  IncredibleEmployment.be  This test is no t yet approved or cleared by the [place] FDA and  has been authorized for detection and/or diagnosis of SARS-CoV-2 by FDA under an Emergency Use Authorization (EUA). This EUA will remain  in effect (meaning this test can be used) for the duration of  the COVID-19 declaration under Section 564(b)(1) of the Act, 21 U.S.C.section 360bbb-3(b)(1), unless the authorization is terminated  or revoked sooner.       Influenza A by PCR NEGATIVE NEGATIVE Final   Influenza B by PCR NEGATIVE NEGATIVE Final  Comment: (NOTE) The Xpert Xpress SARS-CoV-2/FLU/RSV plus assay is intended as an aid in the diagnosis of influenza from Nasopharyngeal swab specimens and should not be used as a sole basis for treatment. Nasal washings and aspirates are unacceptable for Xpert Xpress SARS-CoV-2/FLU/RSV testing.  Fact Sheet for Patients: EntrepreneurPulse.com.au  Fact Sheet for Healthcare Providers: IncredibleEmployment.be  This test is not yet approved or cleared by the [place] FDA and has been authorized for detection and/or diagnosis of SARS-CoV-2 by FDA under an Emergency Use Authorization (EUA). This EUA will remain in effect (meaning this test can be used) for the duration of the COVID-19 declaration under Section 564(b)(1) of the Act, 21 U.S.C. section 360bbb-3(b)(1), unless the authorization is terminated or revoked.  Performed at [facility], [address] [place], [place] [address]   MRSA PCR Screening     Status: None   Collection Time: 05/13/21 10:31 AM  Result Value Ref Range Status   MRSA by PCR NEGATIVE NEGATIVE Final    Comment:        The GeneXpert MRSA Assay (FDA approved for NASAL specimens only), is one component of a comprehensive MRSA colonization surveillance program. It is not intended to diagnose MRSA infection nor to guide or monitor treatment for MRSA infections. Performed at [facility], [place] [address] [place], [place] [address]      Labs:   CBC: Recent Labs  Lab 05/08/21 1258 05/09/21 1306 05/11/21 0206 05/12/21 0445 05/13/21 0350  WBC 9.0 8.2 9.4 8.8 9.4  HGB 11.7* 12.2 12.0 10.1* 10.0*  HCT 37.0 37.6 38.3 31.9* 31.7*  MCV 79.1* 78.8*  80.3 80.4 81.1  PLT 309 320 340 305 092   Basic Metabolic Panel: Recent Labs  Lab 05/09/21 1306 05/11/21 0206 05/12/21 0445 05/13/21 0350 05/14/21 0917  NA 135 137 136 137 135  K 4.0 3.9 4.1 3.9 4.1  CL 103 103 102 104 102  CO2 _0 GLUCOSE 262* 157* 144* 183* 191*  BUN _1 CREATININE 0.75 0.82 0.84 0.78 0.75  CALCIUM 8.9 9.0 8.8* 8.6* 8.8*  MG 2.0 2.2  --   --   --   PHOS 3.0 4.3  --   --   --    Liver Function Tests: Recent Labs  Lab 05/09/21 1306  ALBUMIN 3.6   BNP (last 3 results) No results for input(s): BNP in the last 8760 hours. Cardiac Enzymes: No results for input(s): CKTOTAL, CKMB, CKMBINDEX, TROPONINI in the last 168 hours. CBG: Recent Labs  Lab 05/13/21 1203 05/13/21 1656 05/13/21 2105 05/14/21 0739 05/14/21 1120  GLUCAP 197* 168* 276* 180* 204*   Hgb A1c No results for input(s): HGBA1C in the last 72 hours. Lipid Profile No results for input(s): CHOL, HDL, LDLCALC, TRIG, CHOLHDL, LDLDIRECT in the last 72 hours. Thyroid function studies Recent Labs    05/12/21 1208  TSH 0.022*   Anemia work up Recent Labs    05/12/21 1208  VITAMINB12 183  FOLATE 13.2   Urinalysis    Component Value Date/Time   COLORURINE YELLOW (A) 05/08/2021 1515   APPEARANCEUR HAZY (A) 05/08/2021 1515   LABSPEC 1.025 05/08/2021 1515   PHURINE 5.0 05/08/2021 1515   GLUCOSEU 50 (A) 05/08/2021 1515   HGBUR NEGATIVE 05/08/2021 1515   BILIRUBINUR NEGATIVE 05/08/2021 1515   KETONESUR 5 (A) 05/08/2021 1515   PROTEINUR NEGATIVE 05/08/2021 1515   NITRITE NEGATIVE 05/08/2021 1515   LEUKOCYTESUR TRACE (A) 05/08/2021 1515  Time coordinating discharge: Over 45 minutes  SIGNED: Deatra James, MD, FACP, [facility]. [facility],  Please use [email or web address] to Page If 7PM-7AM, please contact night-coverage [email or web address] [facility] 05/14/2021, 11:43 AM

## 2021-05-15 ENCOUNTER — Inpatient Hospital Stay (HOSPITAL_COMMUNITY)
Admission: RE | Admit: 2021-05-15 | Discharge: 2021-05-29 | DRG: 057 | Disposition: A | Discharge status: Home Health | Payer: Medicare Other | Source: Intra-hospital | Attending: Physical Medicine and Rehabilitation | Admitting: Physical Medicine and Rehabilitation

## 2021-05-15 ENCOUNTER — Encounter (HOSPITAL_COMMUNITY): Payer: Self-pay | Admitting: Physical Medicine and Rehabilitation

## 2021-05-15 ENCOUNTER — Other Ambulatory Visit: Payer: Self-pay

## 2021-05-15 DIAGNOSIS — Z7984 Long term (current) use of oral hypoglycemic drugs: Secondary | ICD-10-CM

## 2021-05-15 DIAGNOSIS — N390 Urinary tract infection, site not specified: Secondary | ICD-10-CM | POA: Diagnosis present

## 2021-05-15 DIAGNOSIS — E119 Type 2 diabetes mellitus without complications: Secondary | ICD-10-CM | POA: Diagnosis present

## 2021-05-15 DIAGNOSIS — E785 Hyperlipidemia, unspecified: Secondary | ICD-10-CM | POA: Diagnosis present

## 2021-05-15 DIAGNOSIS — Z95828 Presence of other vascular implants and grafts: Secondary | ICD-10-CM

## 2021-05-15 DIAGNOSIS — E1169 Type 2 diabetes mellitus with other specified complication: Secondary | ICD-10-CM | POA: Diagnosis not present

## 2021-05-15 DIAGNOSIS — F32A Depression, unspecified: Secondary | ICD-10-CM | POA: Diagnosis present

## 2021-05-15 DIAGNOSIS — E039 Hypothyroidism, unspecified: Secondary | ICD-10-CM | POA: Diagnosis present

## 2021-05-15 DIAGNOSIS — B351 Tinea unguium: Secondary | ICD-10-CM | POA: Diagnosis present

## 2021-05-15 DIAGNOSIS — I63411 Cerebral infarction due to embolism of right middle cerebral artery: Secondary | ICD-10-CM | POA: Diagnosis not present

## 2021-05-15 DIAGNOSIS — E875 Hyperkalemia: Secondary | ICD-10-CM | POA: Diagnosis not present

## 2021-05-15 DIAGNOSIS — R1310 Dysphagia, unspecified: Secondary | ICD-10-CM | POA: Diagnosis present

## 2021-05-15 DIAGNOSIS — I10 Essential (primary) hypertension: Secondary | ICD-10-CM | POA: Diagnosis present

## 2021-05-15 DIAGNOSIS — Z79899 Other long term (current) drug therapy: Secondary | ICD-10-CM

## 2021-05-15 DIAGNOSIS — E11649 Type 2 diabetes mellitus with hypoglycemia without coma: Secondary | ICD-10-CM | POA: Diagnosis present

## 2021-05-15 DIAGNOSIS — D72823 Leukemoid reaction: Secondary | ICD-10-CM | POA: Diagnosis not present

## 2021-05-15 DIAGNOSIS — B353 Tinea pedis: Secondary | ICD-10-CM | POA: Diagnosis present

## 2021-05-15 DIAGNOSIS — N179 Acute kidney failure, unspecified: Secondary | ICD-10-CM | POA: Diagnosis present

## 2021-05-15 DIAGNOSIS — I69322 Dysarthria following cerebral infarction: Secondary | ICD-10-CM | POA: Diagnosis not present

## 2021-05-15 DIAGNOSIS — Z7982 Long term (current) use of aspirin: Secondary | ICD-10-CM

## 2021-05-15 DIAGNOSIS — E559 Vitamin D deficiency, unspecified: Secondary | ICD-10-CM | POA: Diagnosis present

## 2021-05-15 DIAGNOSIS — I69354 Hemiplegia and hemiparesis following cerebral infarction affecting left non-dominant side: Principal | ICD-10-CM

## 2021-05-15 DIAGNOSIS — B9689 Other specified bacterial agents as the cause of diseases classified elsewhere: Secondary | ICD-10-CM | POA: Diagnosis present

## 2021-05-15 DIAGNOSIS — Z7902 Long term (current) use of antithrombotics/antiplatelets: Secondary | ICD-10-CM

## 2021-05-15 DIAGNOSIS — Z79890 Hormone replacement therapy (postmenopausal): Secondary | ICD-10-CM

## 2021-05-15 DIAGNOSIS — I639 Cerebral infarction, unspecified: Secondary | ICD-10-CM

## 2021-05-15 DIAGNOSIS — R112 Nausea with vomiting, unspecified: Secondary | ICD-10-CM | POA: Diagnosis present

## 2021-05-15 DIAGNOSIS — G4700 Insomnia, unspecified: Secondary | ICD-10-CM | POA: Diagnosis present

## 2021-05-15 DIAGNOSIS — Z6832 Body mass index (BMI) 32.0-32.9, adult: Secondary | ICD-10-CM

## 2021-05-15 DIAGNOSIS — R0682 Tachypnea, not elsewhere classified: Secondary | ICD-10-CM

## 2021-05-15 DIAGNOSIS — Z8249 Family history of ischemic heart disease and other diseases of the circulatory system: Secondary | ICD-10-CM

## 2021-05-15 DIAGNOSIS — K5900 Constipation, unspecified: Secondary | ICD-10-CM | POA: Diagnosis present

## 2021-05-15 DIAGNOSIS — R7989 Other specified abnormal findings of blood chemistry: Secondary | ICD-10-CM | POA: Diagnosis not present

## 2021-05-15 DIAGNOSIS — R1110 Vomiting, unspecified: Secondary | ICD-10-CM

## 2021-05-15 DIAGNOSIS — I6300 Cerebral infarction due to thrombosis of unspecified precerebral artery: Secondary | ICD-10-CM

## 2021-05-15 DIAGNOSIS — E669 Obesity, unspecified: Secondary | ICD-10-CM | POA: Diagnosis present

## 2021-05-15 DIAGNOSIS — Z794 Long term (current) use of insulin: Secondary | ICD-10-CM

## 2021-05-15 DIAGNOSIS — I2510 Atherosclerotic heart disease of native coronary artery without angina pectoris: Secondary | ICD-10-CM | POA: Diagnosis present

## 2021-05-15 LAB — GLUCOSE, CAPILLARY
Glucose-Capillary: 179 mg/dL — ABNORMAL HIGH (ref 70–99)
Glucose-Capillary: 161 mg/dL — ABNORMAL HIGH (ref 70–99)
Glucose-Capillary: 200 mg/dL — ABNORMAL HIGH (ref 70–99)
Glucose-Capillary: 150 mg/dL — ABNORMAL HIGH (ref 70–99)

## 2021-05-15 LAB — BASIC METABOLIC PANEL
Anion gap: 10 (ref 5–15)
BUN: 22 mg/dL (ref 8–23)
CO2: 23 mmol/L (ref 22–32)
Calcium: 8.8 mg/dL — ABNORMAL LOW (ref 8.9–10.3)
Chloride: 102 mmol/L (ref 98–111)
Creatinine, Ser: 0.92 mg/dL (ref 0.44–1.00)
GFR, Estimated: >60 mL/min (ref >60)
Glucose, Bld: 203 mg/dL — ABNORMAL HIGH (ref 70–99)
Potassium: 4.3 mmol/L (ref 3.5–5.1)
Sodium: 135 mmol/L (ref 135–145)

## 2021-05-15 MED ORDER — MELATONIN 3 MG PO TABS
3.0000 mg | ORAL_TABLET | Freq: Every evening | ORAL | Status: DC | PRN
  Administered 2021-05-19 – 2021-05-28 (×4): 3 mg via ORAL
  Filled 2021-05-15 (×4): qty 1

## 2021-05-15 MED ORDER — CITALOPRAM HYDROBROMIDE 10 MG PO TABS
10.0000 mg | ORAL_TABLET | Freq: Every day | ORAL | Status: DC
  Administered 2021-05-15 – 2021-05-29 (×15): 10 mg via ORAL
  Filled 2021-05-15 (×15): qty 1

## 2021-05-15 MED ORDER — ENOXAPARIN SODIUM 40 MG/0.4ML IJ SOSY: 40.0000 mg | PREFILLED_SYRINGE | INTRAMUSCULAR | Status: DC

## 2021-05-15 MED ORDER — CLOPIDOGREL BISULFATE 75 MG PO TABS
75.0000 mg | ORAL_TABLET | Freq: Every day | ORAL | Status: DC
  Administered 2021-05-16 – 2021-05-29 (×14): 75 mg via ORAL
  Filled 2021-05-15 (×14): qty 1

## 2021-05-15 MED ORDER — METFORMIN HCL 500 MG PO TABS
1000.0000 mg | ORAL_TABLET | Freq: Two times a day (BID) | ORAL | Status: DC
  Administered 2021-05-15 – 2021-05-29 (×27): 1000 mg via ORAL
  Filled 2021-05-15 (×29): qty 2

## 2021-05-15 MED ORDER — LIVING WELL WITH DIABETES BOOK
Freq: Once | Status: AC
  Filled 2021-05-15: qty 1

## 2021-05-15 MED ORDER — LEVOTHYROXINE SODIUM 25 MCG PO TABS
125.0000 ug | ORAL_TABLET | Freq: Every day | ORAL | Status: DC
  Administered 2021-05-16 – 2021-05-29 (×14): 125 ug via ORAL
  Filled 2021-05-15 (×14): qty 1

## 2021-05-15 MED ORDER — ATORVASTATIN CALCIUM 80 MG PO TABS
80.0000 mg | ORAL_TABLET | Freq: Every day | ORAL | Status: DC
  Administered 2021-05-15 – 2021-05-28 (×14): 80 mg via ORAL
  Filled 2021-05-15 (×14): qty 1

## 2021-05-15 MED ORDER — ENOXAPARIN SODIUM 40 MG/0.4ML IJ SOSY
40.0000 mg | PREFILLED_SYRINGE | INTRAMUSCULAR | Status: DC
  Administered 2021-05-15 – 2021-05-17 (×3): 40 mg via SUBCUTANEOUS
  Filled 2021-05-15 (×3): qty 0.4

## 2021-05-15 MED ORDER — EXERCISE FOR HEART AND HEALTH BOOK
Freq: Once | Status: AC
  Filled 2021-05-15: qty 1

## 2021-05-15 MED ORDER — CITALOPRAM HYDROBROMIDE 10 MG/5ML PO SOLN: 10.0000 mg | Freq: Every day | ORAL | Status: DC

## 2021-05-15 MED ORDER — LISINOPRIL 10 MG PO TABS
10.0000 mg | ORAL_TABLET | Freq: Every day | ORAL | Status: DC
  Administered 2021-05-16 – 2021-05-17 (×2): 10 mg via ORAL
  Filled 2021-05-15 (×2): qty 1

## 2021-05-15 MED ORDER — ACETAMINOPHEN 325 MG PO TABS
650.0000 mg | ORAL_TABLET | Freq: Four times a day (QID) | ORAL | Status: DC | PRN
  Administered 2021-05-28: 650 mg via ORAL
  Filled 2021-05-15: qty 2

## 2021-05-15 MED ORDER — INSULIN ASPART 100 UNIT/ML IJ SOLN
0.0000 [IU] | Freq: Three times a day (TID) | INTRAMUSCULAR | Status: DC
  Administered 2021-05-15 – 2021-05-16 (×3): 3 [IU] via SUBCUTANEOUS
  Administered 2021-05-16: 2 [IU] via SUBCUTANEOUS
  Administered 2021-05-17: 3 [IU] via SUBCUTANEOUS
  Administered 2021-05-17 – 2021-05-18 (×2): 2 [IU] via SUBCUTANEOUS
  Administered 2021-05-18: 3 [IU] via SUBCUTANEOUS
  Administered 2021-05-19: 2 [IU] via SUBCUTANEOUS
  Administered 2021-05-19: 5 [IU] via SUBCUTANEOUS
  Administered 2021-05-22: 2 [IU] via SUBCUTANEOUS
  Administered 2021-05-22 – 2021-05-23 (×2): 3 [IU] via SUBCUTANEOUS
  Administered 2021-05-25: 2 [IU] via SUBCUTANEOUS
  Administered 2021-05-25: 5 [IU] via SUBCUTANEOUS
  Administered 2021-05-26 – 2021-05-27 (×4): 2 [IU] via SUBCUTANEOUS

## 2021-05-15 MED ORDER — GLIPIZIDE 5 MG PO TABS
10.0000 mg | ORAL_TABLET | Freq: Two times a day (BID) | ORAL | Status: DC
  Administered 2021-05-15 – 2021-05-23 (×15): 10 mg via ORAL
  Filled 2021-05-15 (×17): qty 2

## 2021-05-15 MED ORDER — BLOOD PRESSURE CONTROL BOOK
Freq: Once | Status: AC
  Filled 2021-05-15: qty 1

## 2021-05-15 MED ORDER — ASPIRIN EC 81 MG PO TBEC
81.0000 mg | DELAYED_RELEASE_TABLET | Freq: Every day | ORAL | Status: DC
  Administered 2021-05-16 – 2021-05-29 (×14): 81 mg via ORAL
  Filled 2021-05-15 (×14): qty 1

## 2021-05-15 NOTE — H&P (Signed)
Physical Medicine and Rehabilitation Admission H&P   CC: large vessel stroke  HPI: Madison Davidson is a 70 year old right-handed female with history of type 2 diabetes mellitus hypertension hyperlipidemia hypothyroidism obesity with BMI 32.90 CAD as well as prior CVA maintained on aspirin 81 mg daily.  Per chart review patient lives alone independent prior to admission.  1 level home 2 steps to entry.  She has family in the area that can provide intermittent supervision assistance as needed.  [facility]ed to [facility]/08/20[facility] after a fall with brief loss of consciousness transient left-sided weakness and slurred speech.  Cranial CT scan showed ill-defined hypodensities right frontal white matter compatible with ischemia of indeterminate age.  Possible subacute.  No acute hemorrhage.  MRI of the brain acute to early subacute right frontal lobe infarction.  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  Patient did not receive tPA.  Carotid ultrasound with no significant stenosis.  CT angiogram of head with marked stenosis of the right MCA origin and bifurcation.  Moderate left M2 MCA branch stenosis.  Patient was transferred to [facility] and underwent right distal M1 stenting per interventional radiology.  Maintained on aspirin 81 mg daily and Plavix 75 mg daily x3 months then aspirin alone given multifocal intracranial stenosis.  Echocardiogram with ejection fraction of 60 to 65% no wall motion abnormalities.  Subcutaneous Lovenox for DVT prophylaxis.  Dysphagia #2 thin liquid diet.  Due to patient decreased functional mobility left side weakness was admitted for a comprehensive rehab program. No current complaints.   Review of Systems  Constitutional:  Negative for chills and fever.  HENT:  Negative for hearing loss.   Eyes:  Negative for blurred vision and double vision.  Respiratory:  Negative for cough and shortness of breath.   Cardiovascular:  Negative for chest pain,  palpitations and leg swelling.  Gastrointestinal:  Positive for constipation. Negative for heartburn, nausea and vomiting.  Genitourinary:  Negative for dysuria, flank pain and hematuria.  Musculoskeletal:  Positive for myalgias.  Skin:  Negative for rash.  Neurological:  Positive for speech change and weakness.  Psychiatric/Behavioral:  Positive for depression. The patient has insomnia.        Anxiety  All other systems reviewed and are negative. Past Medical History:  Diagnosis Date   Cancer ([place])    skin   Coronary artery disease    Diabetes mellitus without complication ([place])    Hypertension    Stroke [facility])    Past Surgical History:  Procedure Laterality Date   ABDOMINAL HYSTERECTOMY     IR ANGIO INTRA EXTRACRAN SEL COM CAROTID INNOMINATE UNI L MOD SED  05/11/2021   IR ANGIO VERTEBRAL SEL VERTEBRAL UNI R MOD SED  05/11/2021   IR ANGIOGRAM EXTREMITY LEFT  05/11/2021   IR CT HEAD LTD  05/11/2021   IR INTRA CRAN STENT  05/11/2021   IR INTRA CRAN STENT  05/12/2021   IR US GUIDE VASC ACCESS RIGHT  05/11/2021   RADIOLOGY WITH ANESTHESIA N/A 05/11/2021   Procedure: IR WITH ANESTHESIA - INTRACRANIAL STENT;  Surgeon: Pedro Earls, MD;  Location: [place];  Service: Radiology;  Laterality: N/A;   Family History  Problem Relation Age of Onset   Heart disease Mother    Heart disease Father    Social History:  reports that she has never smoked. She has never used smokeless tobacco. She reports that she does not drink alcohol and does not use drugs. Allergies: No  Known Allergies Medications Prior to Admission  Medication Sig Dispense Refill   aspirin EC 81 MG tablet Take 81 mg by mouth daily as needed (headache).     atorvastatin (LIPITOR) 20 MG tablet Take 4 tablets (80 mg total) by mouth at bedtime.     citalopram (CELEXA) 10 MG tablet Take 10 mg by mouth daily.     clopidogrel (PLAVIX) 75 MG tablet Take 1 tablet (75 mg total) by mouth daily. 30 tablet 0   glipiZIDE  (GLUCOTROL) 10 MG tablet Take 10 mg by mouth 2 (two) times daily before a meal.     insulin aspart (NOVOLOG) 100 UNIT/ML injection Inject 0-15 Units into the skin 3 (three) times daily with meals. 10 mL 11   levothyroxine (SYNTHROID) 125 MCG tablet Take 1 tablet (125 mcg total) by mouth daily at 6 (six) AM.     lisinopril (ZESTRIL) 10 MG tablet Take 1 tablet (10 mg total) by mouth daily. 30 tablet 0   melatonin 3 MG TABS tablet Take 1 tablet (3 mg total) by mouth at bedtime as needed. 30 tablet 0   metFORMIN (GLUCOPHAGE) 500 MG tablet Take 1,000 mg by mouth 2 (two) times daily with a meal.      Drug Regimen Review Drug regimen was reviewed and remains appropriate with no significant issues identified  Home:     Functional History:    Functional Status:  Mobility:          ADL:    Cognition: Cognition Orientation Level: Oriented X4    Physical Exam: Height 5\' 2"  (1.575 m). Physical Exam Gen: no distress, normal appearing HEENT: oral mucosa pink and moist, NCAT, poor oral health Cardio: Reg rate Chest: normal effort, normal rate of breathing Abd: soft, non-distended Ext: no edema Psych: pleasant, normal affect Skin: intact Neurological:     Comments: Patient is alert.  No acute distress.  Makes eye contact with examiner.  Speech is mildly dysarthric but intelligible.  Follows commands.  Fair insight and awareness. 4/5 strength throughout. Psychomotor slowing  Results for orders placed or performed during the hospital encounter of 05/11/21 (from the past 48 hour(s))  Glucose, capillary     Status: Abnormal   Collection Time: 05/13/21  4:56 PM  Result Value Ref Range   Glucose-Capillary 168 (H) 70 - 99 mg/dL    Comment: Glucose reference range applies only to samples taken after fasting for at least 8 hours.  Glucose, capillary     Status: Abnormal   Collection Time: 05/13/21  9:05 PM  Result Value Ref Range   Glucose-Capillary 276 (H) 70 - 99 mg/dL    Comment:  Glucose reference range applies only to samples taken after fasting for at least 8 hours.   Comment 1 Notify RN    Comment 2 Document in Chart   Glucose, capillary     Status: Abnormal   Collection Time: 05/14/21  7:39 AM  Result Value Ref Range   Glucose-Capillary 180 (H) 70 - 99 mg/dL    Comment: Glucose reference range applies only to samples taken after fasting for at least 8 hours.  Basic metabolic panel     Status: Abnormal   Collection Time: 05/14/21  9:17 AM  Result Value Ref Range   Sodium 135 135 - 145 mmol/L   Potassium 4.1 3.5 - 5.1 mmol/L   Chloride 102 98 - 111 mmol/L   CO2 24 22 - 32 mmol/L   Glucose, Bld 191 (H) 70 - 99 mg/dL  Comment: Glucose reference range applies only to samples taken after fasting for at least 8 hours.   BUN 16 8 - 23 mg/dL   Creatinine, Ser 0.75 0.44 - 1.00 mg/dL   Calcium 8.8 (L) 8.9 - 10.3 mg/dL   GFR, Estimated >60 >60 mL/min    Comment: (NOTE) Calculated using the CKD-EPI Creatinine Equation (2021)    Anion gap 9 5 - 15    Comment: Performed at [facility] [address] [place], [place] [address]  Glucose, capillary     Status: Abnormal   Collection Time: 05/14/21 11:20 AM  Result Value Ref Range   Glucose-Capillary 204 (H) 70 - 99 mg/dL    Comment: Glucose reference range applies only to samples taken after fasting for at least 8 hours.  Glucose, capillary     Status: Abnormal   Collection Time: 05/14/21  4:08 PM  Result Value Ref Range   Glucose-Capillary 251 (H) 70 - 99 mg/dL    Comment: Glucose reference range applies only to samples taken after fasting for at least 8 hours.  Glucose, capillary     Status: Abnormal   Collection Time: 05/14/21  9:13 PM  Result Value Ref Range   Glucose-Capillary 155 (H) 70 - 99 mg/dL    Comment: Glucose reference range applies only to samples taken after fasting for at least 8 hours.  Basic metabolic panel     Status: Abnormal   Collection Time: 05/15/21  2:46 AM  Result Value Ref  Range   Sodium 135 135 - 145 mmol/L   Potassium 4.3 3.5 - 5.1 mmol/L   Chloride 102 98 - 111 mmol/L   CO2 23 22 - 32 mmol/L   Glucose, Bld 203 (H) 70 - 99 mg/dL    Comment: Glucose reference range applies only to samples taken after fasting for at least 8 hours.   BUN 22 8 - 23 mg/dL   Creatinine, Ser 0.92 0.44 - 1.00 mg/dL   Calcium 8.8 (L) 8.9 - 10.3 mg/dL   GFR, Estimated >60 >60 mL/min    Comment: (NOTE) Calculated using the CKD-EPI Creatinine Equation (2021)    Anion gap 10 5 - 15    Comment: Performed at [facility] [address] [place], [place] [address]  Glucose, capillary     Status: Abnormal   Collection Time: 05/15/21  8:04 AM  Result Value Ref Range   Glucose-Capillary 179 (H) 70 - 99 mg/dL    Comment: Glucose reference range applies only to samples taken after fasting for at least 8 hours.  Glucose, capillary     Status: Abnormal   Collection Time: 05/15/21 12:22 PM  Result Value Ref Range   Glucose-Capillary 161 (H) 70 - 99 mg/dL    Comment: Glucose reference range applies only to samples taken after fasting for at least 8 hours.   No results found.     Medical Problem List and Plan: 1.  Left-sided weakness with dysarthria secondary to right corona radiata/S0 infarcts likely secondary to large vessel disease source with right MCA high-grade stenosis status post stenting  -patient may shower  -ELOS/Goals: 7-8 days S  Check vitamin D level tomorrow.  2.  Impaired mobility, ambulating 20 feet -DVT/anticoagulation: Continue Lovenox  -antiplatelet therapy: Aspirin 81 mg daily and Plavix 75 mg daily x3 months then aspirin alone 3. Pain Management: Tylenol 4. Depression: Celexa 10 mg daily. Melatonin as needed. Check magnesium level tomorrow. Provide emotional support  -antipsychotic agents: N/A 5.  Neuropsych: This patient is capable of making decisions on her own behalf. 6. Skin/Wound Care: Routine skin checks 7. Fluids/Electrolytes/Nutrition: Routine  in and outs with follow-up chemistries 8.  Hyperlipidemia.  Lipitor 9.  Dysphagia.  Dysphagia #2 thin liquids.  Follow-up speech therapy 10.  Diabetes mellitus.  Hemoglobin A1c 7.1.  Glucotrol 10 mg twice daily, Glucophage 1000 mg twice daily. 11.  Hypothyroidism.  Synthroid 12.  Hypertension.  Labile, continue Lisinopril 10 mg daily.  Monitor with increased mobility  I have personally performed a face to face diagnostic evaluation, including, but not limited to relevant history and physical exam findings, of this patient and developed relevant assessment and plan.  Additionally, I have reviewed and concur with the physician assistant's documentation above.  Izora Ribas, MD 05/15/2021

## 2021-05-15 NOTE — Care Management Important Message (Signed)
Important Message  Patient Details  Name: Madison Davidson MRN: [id number] Date of Birth: March 10, 1951   Medicare Important Message Given:  Yes     Barb Merino Brave 05/15/2021, 3:13 PM

## 2021-05-15 NOTE — Progress Notes (Signed)
Occupational Therapy Treatment Patient Details Name: IA LEEB MRN: [id number] DOB: 08/08/1951 Today's Date: 05/15/2021    History of present illness Pt is a 70 y.o. female admitted 6/13 to [facility] for a fall with brief loss of consciousness and then developed transient left leg numbness and slurred speech. Transferred to [facility] for planned right MCA intracranial stenting. 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 PMH of DM, HTN, skin cancer, CAD, CVA.   OT comments  Pt received supine in bed agreeable to OT intervention. Pt continues to present with impaired safety awareness, decreased activity tolerance and impaired balance. Pt currently requires MIN hand held assist for household distance functional mobility and min guard for standing grooming tasks at sink with at least one UE supported. Pt mildly impulsive this session and noted to be easily distracted needing cues to redirect back to session. Pt would continue to benefit from skilled occupational therapy while admitted and after d/c to address the below listed limitations in order to improve overall functional mobility and facilitate independence with BADL participation. DC plan remains appropriate, will follow acutely per POC.    Follow Up Recommendations  CIR    Equipment Recommendations  3 in 1 bedside commode    Recommendations for Other Services      Precautions / Restrictions Precautions Precautions: Fall Restrictions Weight Bearing Restrictions: No       Mobility Bed Mobility Overal bed mobility: Needs Assistance Bed Mobility: Supine to Sit     Supine to sit: Min assist;HOB elevated     General bed mobility comments: light MIN A to elevate trunk into sitting, increased time to mobilize to EOB as pt distracted needing step by step cues to sequence task    Transfers Overall transfer level:  Needs assistance Equipment used: None Transfers: Sit to/from Stand Sit to Stand: Supervision         General transfer comment: pt impulsively sit<>stand from EOB with supervision and no AD    Balance Overall balance assessment: Needs assistance Sitting-balance support: Feet supported;No upper extremity supported Sitting balance-Leahy Scale: Fair     Standing balance support: Single extremity supported;During functional activity Standing balance-Leahy Scale: Poor Standing balance comment: at least one UE supported during ADLs                           ADL either performed or assessed with clinical judgement   ADL Overall ADL's : Needs assistance/impaired     Grooming: Oral care;Standing;Min guard;Wash/dry face Grooming Details (indicate cue type and reason): min guard to stand for oral care at sink                 Toilet Transfer: Minimal assistance;Ambulation Toilet Transfer Details (indicate cue type and reason): simulated via functional mobility with no AD and MIN HHA         Functional mobility during ADLs: Minimal assistance;Cueing for safety General ADL Comments: pt continues to present with decreased safety awareness and balance impairments     Vision Baseline Vision/History: Wears glasses Wears Glasses: Distance only Patient Visual Report: No change from baseline     Perception     Praxis      Cognition Arousal/Alertness: Awake/alert Behavior During Therapy: Flat affect;Impulsive Overall Cognitive Status: Impaired/Different from baseline Area of Impairment: Attention;Memory;Safety/judgement;Awareness;Following commands;Problem solving  Current Attention Level: Sustained Memory: Decreased short-term memory (repeated cues for safety) Following Commands: Follows one step commands with increased time Safety/Judgement: Decreased awareness of safety;Decreased awareness of deficits Awareness: Intellectual Problem  Solving: Slow processing;Requires verbal cues General Comments: pt easily distracted during session noted to impulsively sit<>stand from EOB during session. increased time to follow commands as pt distracted by extraneous stimuli, flat affect throughout session with daughter mostly answering questions        Exercises General Exercises - Lower Extremity Ankle Circles/Pumps: AROM;Both;10 reps;Supine Long Arc Quad: Strengthening;Both;10 reps;Supine Hip ABduction/ADduction: Strengthening;Both;10 reps;Supine Other Exercises Other Exercises: tricep rows with BUE positioned on arm rests x10 reps   Shoulder Instructions       General Comments VSS on RA, pts daughter present during session    Pertinent Vitals/ Pain       Pain Assessment: No/denies pain  Home Living                                          Prior Functioning/Environment              Frequency  Min 2X/week        Progress Toward Goals  OT Goals(current goals can now be found in the care plan section)  Progress towards OT goals: Progressing toward goals  Acute Rehab OT Goals Patient Stated Goal: none stated Time For Goal Achievement: 05/26/21 Potential to Achieve Goals: Good  Plan Discharge plan remains appropriate;Frequency remains appropriate    Co-evaluation                 AM-PAC OT "6 Clicks" Daily Activity     Outcome Measure   Help from another person eating meals?: None Help from another person taking care of personal grooming?: A Little Help from another person toileting, which includes using toliet, bedpan, or urinal?: A Little Help from another person bathing (including washing, rinsing, drying)?: A Little Help from another person to put on and taking off regular upper body clothing?: None Help from another person to put on and taking off regular lower body clothing?: A Little 6 Click Score: 20    End of Session Equipment Utilized During Treatment: Gait belt  OT  Visit Diagnosis: Unsteadiness on feet (R26.81);Other abnormalities of gait and mobility (R26.89);Muscle weakness (generalized) (M62.81);Other symptoms and signs involving the nervous system (R29.898);Other symptoms and signs involving cognitive function   Activity Tolerance Patient tolerated treatment well   Patient Left in chair;with call bell/phone within reach   Nurse Communication Mobility status        Time: 1232-1259 OT Time Calculation (min): 27 min  Charges: OT General Charges $OT Visit: 1 Visit OT Treatments $Self Care/Home Management : 23-37 mins  Harley Alto., COTA/L Acute Rehabilitation Services [place] 05/15/2021, 3:12 PM

## 2021-05-15 NOTE — Progress Notes (Signed)
Patient ID: Madison Davidson, female   DOB: Apr 19, 1951, 70 y.o.   MRN: [id number] Met with the patient and daughter to review role of the nurse CM and initiate education on secondary stroke risks. Patient lived alone PTA and reports baking over fried foods and monitors DM (A1C 7.1) Statin post stroke is new for her (Trig 220, LDL 119) and HTN. Also reviewed DAPT (ASA + Plavix) x 3 months then ASA solo per MD. Information handouts and booklets for family to review as well left at the bedside. Continue to follow along to discharge to address educational needs and facilitate preparation for discharge. Margarito Liner, RN

## 2021-05-15 NOTE — Progress Notes (Signed)
Signed                                                                                                                                                                                                                                                                                                                                                                                                                                                                                  PMR Admission Coordinator Pre-Admission Assessment   Patient: Madison Davidson is an 70 y.o., female MRN: [id number] DOB: 06-16-1951 Height: _0  (157.5 cm) Weight: 81.6 kg   Insurance Information HMO:     PPO:      PCP:     IPA:      80/20: yes     OTHER: PRIMARY: Medicare A & B      Policy#: [id number]      Subscriber: patient CM Name:       Phone#:      Fax#: Pre-Cert#:       Employer: Benefits:  Phone #: verified eligibility via [place] on 05/13/21     Name: Eff. Date: Part A & B effective 11/26/09     Deduct: $1,556  Out of Pocket Max: NA      Life Max: NA CIR: 100% with Medicare approval      SNF: 100% days 1-20, 80% days 21-100 Outpatient: 80%     Co-Pay: 20% Home Health: 100%      Co-Pay: DME: 80%     Co-Pay: 20% Providers: pt's choice SECONDARY: Medicaid [place] Access     Policy#: [id number]     Phone#: [phone]   Financial Counselor:       Phone#:   The "Data Collection Information Summary" for patients in Inpatient Rehabilitation Facilities with attached "Privacy Act [place] Records" was provided and verbally reviewed with: Patient   Emergency Contact Information Contact Information       Name Relation Home Work Mobile    trinidad,sabrina       [phone]    [facility] A Daughter [phone]               Current Medical  History  Patient Admitting Diagnosis: CVA   History of Present Illness: Pt is a 70 year old female with medical hx significant for: DM, HTN, skin CA, CAD, CVA. Pt presented to [facility] ED on 05/08/21 d/t slurred speech. Pt reported that she fell 3 weeks prior, briefly lost consciousness and developed transient left leg numbness. She noted that she has been feeling more weak and tired since the fall.  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 Pt transferred to [facility] d/t severe intracranial atherosclerosis/severe symptomatic right MCA stenosis.  Pt underwent distal M1/MCA segment angioplasty and stenting on 05/11/21. Therapy evaluations complete and CIR recommended d/t pt's deficits in functional mobility, cognition, dysphagia, and the ability to complete ADLs independently.   Complete NIHSS TOTAL: 2   Patient's medical record from [facility] has been reviewed by the rehabilitation admission coordinator and physician.   Past Medical History      Past Medical History:  Diagnosis Date   Cancer ([place])      skin   Coronary artery disease     Diabetes mellitus without complication ([place])     Hypertension     Stroke [facility])        Family History   family history includes Heart disease in her father and mother.   Prior Rehab/Hospitalizations Has the patient had prior rehab or hospitalizations prior to admission? No   Has the patient had major surgery during 100 days prior to admission? Yes              Current Medications   Current Facility-Administered Medications:   acetaminophen (TYLENOL) tablet 650 mg, 650 mg, Oral, Q6H PRN, Hall, Carole N, DO   aspirin EC tablet 81 mg, 81 mg, Oral, Daily, Irene Pap N, DO, 81 mg at 05/15/21 0737   atorvastatin (LIPITOR) tablet 80 mg, 80 mg, Oral, QHS, Hall, Carole N, DO, 80 mg at 05/14/21 2114   Chlorhexidine Gluconate Cloth 2 % PADS 6 each, 6 each, Topical,  Daily, Rosalin Hawking, MD, 6 each at 05/15/21 0917   clevidipine (CLEVIPREX) infusion 0.5 mg/mL, 0-21 mg/hr, Intravenous, Continuous, de Sindy Messing, [place], MD, Stopped at 05/11/21 1630   clopidogrel (PLAVIX) tablet 75 mg, 75 mg, Oral, Daily, Hall, Carole N, DO, 75 mg at 05/15/21 0921   enoxaparin (LOVENOX) injection 40 mg, 40 mg, Subcutaneous, Q24H, Shahmehdi, Seyed A, MD, 40 mg at 05/14/21 1421   insulin aspart (novoLOG) injection 0-15 Units, 0-15 Units, Subcutaneous,  TID WC, Shahmehdi, Seyed A, MD, 3 Units at 05/15/21 0921   insulin aspart (novoLOG) injection 0-5 Units, 0-5 Units, Subcutaneous, QHS, Shahmehdi, Seyed A, MD, 3 Units at 05/13/21 2110   levothyroxine (SYNTHROID) tablet 125 mcg, 125 mcg, Oral, Q0600, Kayleen Memos, DO, 125 mcg at 05/15/21 0509   lisinopril (ZESTRIL) tablet 10 mg, 10 mg, Oral, Daily, Shahmehdi, Seyed A, MD, 10 mg at 05/15/21 8115   melatonin tablet 3 mg, 3 mg, Oral, QHS PRN, Irene Pap N, DO, 3 mg at 05/13/21 2106   ondansetron (ZOFRAN) injection 4 mg, 4 mg, Intravenous, Q6H PRN, de Sindy Messing, Erven Colla, MD   Patients Current Diet:  Diet Order                  Diet - low sodium heart healthy             DIET DYS 2 Room service appropriate? Yes; Fluid consistency: Thin  Diet effective now                         Precautions / Restrictions Precautions Precautions: Fall Precaution Comments: pocketing food L chk Restrictions Weight Bearing Restrictions: No    Has the patient had 2 or more falls or a fall with injury in the past year? Yes   Prior Activity Level Community (5-7x/wk): drives; gets out of house 5 days/week   Prior Functional Level Self Care: Did the patient need help bathing, dressing, using the toilet or eating? Independent   Indoor Mobility: Did the patient need assistance with walking from room to room (with or without device)? Independent   Stairs: Did the patient need assistance with internal or external stairs (with  or without device)? Independent   Functional Cognition: Did the patient need help planning regular tasks such as shopping or remembering to take medications? Independent   Home Assistive Devices / Equipment Home Assistive Devices/Equipment: None Home Equipment: Cane - single point, Grab bars - toilet, Grab bars - tub/shower   Prior Device Use: Indicate devices/aids used by the patient prior to current illness, exacerbation or injury? None of the above   Current Functional Level Cognition   Arousal/Alertness: Awake/alert Overall Cognitive Status: Impaired/Different from baseline Current Attention Level: Sustained Orientation Level: Oriented X4 Following Commands: Follows one step commands with increased time Safety/Judgement: Decreased awareness of safety, Decreased awareness of deficits General Comments: A&Ox4. Inattention to L side noted during functional tasks, needing repeated cues for L UE placement on RW. Pt with very slow processing and poor safety awareness needing repeated cues to keep RW proximal to her and not leave it behind. Poor attention span. flat affect throughout, intermittently speaking with very soft voice. Slight impulsivity. Attention: Focused, Sustained Focused Attention: Appears intact Sustained Attention: Impaired Sustained Attention Impairment: Verbal basic, Functional basic Memory: Impaired Memory Impairment: Storage deficit Awareness: Impaired Awareness Impairment: Intellectual impairment, Emergent impairment, Anticipatory impairment Problem Solving: Impaired Problem Solving Impairment: Verbal basic, Functional basic Executive Function: Reasoning, Sequencing, Self Monitoring, Self Correcting, Initiating, Decision Making Reasoning: Impaired Sequencing: Impaired Decision Making: Impaired Initiating: Impaired Self Monitoring: Impaired Self Correcting: Impaired    Extremity Assessment (includes Sensation/Coordination)   Upper Extremity Assessment: Defer  to OT evaluation LUE Deficits / Details: AROM and strength WFL for ADL; note incoordination LUE Sensation: decreased proprioception ("feels likel it's heavy adn trying to fall asleep") LUE Coordination: decreased fine motor  Lower Extremity Assessment: Generalized weakness, LLE deficits/detail LLE Deficits / Details: Noted decreased strength compared  to R with functional mobility, MMT of grossly 4- to 4 compared to grossly 4 on R; 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; intact sensation to light touch LLE Sensation: decreased proprioception (noted with mobility; able to detect location of light touch with precision with eyes closed, denies numbness/tingling) LLE Coordination: decreased fine motor, decreased gross motor (dysdiadochokinesia noted)     ADLs   Overall ADL's : Needs assistance/impaired Eating/Feeding: Supervision/ safety, Set up Eating/Feeding Details (indicate cue type and reason): note pocketing of food L side of mouth; cues to clear Grooming: Supervision/safety, Set up, Standing Grooming Details (indicate cue type and reason): required cues to find soap on L wall Upper Body Bathing: Set up, Supervision/ safety, Sitting Lower Body Bathing: Sit to/from stand, Moderate assistance Lower Body Bathing Details (indicate cue type and reason): mod redirectonal cues with any distraction Upper Body Dressing : Minimal assistance Lower Body Dressing: Moderate assistance, Sit to/from stand Toilet Transfer: Minimal assistance, Ambulation, Grab bars Toileting- Clothing Manipulation and Hygiene: Min guard, Sit to/from stand Functional mobility during ADLs: Minimal assistance, Cueing for safety General ADL Comments: very slow; easily distracted     Mobility   Overal bed mobility: Needs Assistance Bed Mobility: Supine to Sit, Sit to Supine Supine to sit: Min guard, HOB elevated Sit to supine: Mod assist General bed mobility comments: Extra time and min guard assist to transition to sit EOB with  HOB elevated and utilization of rails. Cues provided to manage legs off bed. ModA to lift legs back into bed with return to supine.     Transfers   Overall transfer level: Needs assistance Equipment used: Rolling walker (2 wheeled) Transfers: Sit to/from Stand Sit to Stand: Min assist General transfer comment: Sit <> stand 1x from EOB and 2x from commode > RW, minA to power up and steady, requiring extra time and cues to transition hands to proper locations on RW (particularly on her L). Needs cues to keep RW proximal to her when preparing to transfer to sit as she tends to push it to the side away from her.     Ambulation / Gait / Stairs / Wheelchair Mobility   Ambulation/Gait Ambulation/Gait assistance: Min assist, Mod assist Gait Distance (Feet): 20 Feet (x2 bouts of ~20 ft each) Assistive device: Rolling walker (2 wheeled) Gait Pattern/deviations: Step-through pattern, Decreased stride length, Decreased dorsiflexion - right, Decreased dorsiflexion - left, Trunk flexed General Gait Details: Pt with slow, unsteady gait, taking small bil steps while maintaining a trunk flexed posture. Min-modA to maintain her balance, particularly when she impulsively pushes her RW away from her when approaching the sink or a place to sit. Gait velocity: decreased Gait velocity interpretation: <1.31 ft/sec, indicative of household ambulator     Posture / Balance Dynamic Sitting Balance Sitting balance - Comments: Static sitting EOB no LOB, min guard for safety. Balance Overall balance assessment: Needs assistance Sitting-balance support: Feet supported, No upper extremity supported Sitting balance-Leahy Scale: Fair Sitting balance - Comments: Static sitting EOB no LOB, min guard for safety. Standing balance support: Bilateral upper extremity supported, Single extremity supported, During functional activity Standing balance-Leahy Scale: Poor Standing balance comment: Reliant on 1-2 UE support and  min-modA.     Special needs/care consideration Diabetic management novoLOG: 0-5 units daily at bedtime; novoLOG: 0-15 units 3 times daily at meals and Designated visitor [place], daughter; Ila Mcgill, daughter    Previous Home Environment (from acute therapy documentation) Living Arrangements: Alone  Lives With: Alone Available Help at Discharge: Family,  Available PRN/intermittently Type of Home: Apartment Home Layout: One level Home Access: Stairs to enter Entrance Stairs-Rails: Right, Left Entrance Stairs-Number of Steps: 1 Bathroom Shower/Tub: Optometrist: No Home Care Services: No   Discharge Living Setting Plans for Discharge Living Setting: Patient's home Type of Home at Discharge: Apartment Discharge Home Layout: One level Discharge Home Access: Stairs to enter Entrance Stairs-Rails: Right, Left Entrance Stairs-Number of Steps: 1 Discharge Bathroom Shower/Tub: Tub/shower unit Discharge Bathroom Toilet: Standard Discharge Bathroom Accessibility: No Does the patient have any problems obtaining your medications?: No   Social/Family/Support Systems Anticipated Caregiver: Hyacinth Meeker & Ila Mcgill, daughters Anticipated Caregiver's Contact Information: [name]: [phone], Tabatha:[phone] Caregiver Availability: Intermittent Discharge Plan Discussed with Primary Caregiver: Yes Is Caregiver In Agreement with Plan?: Yes Does Caregiver/Family have Issues with Lodging/Transportation while Pt is in Rehab?: No   Goals Patient/Family Goal for Rehab: Supervision-Mod I: PT/OT/ST Expected length of stay: 12-14 days Pt/Family Agrees to Admission and willing to participate: Yes Program Orientation Provided & Reviewed with Pt/Caregiver Including Roles  & Responsibilities: Yes   Decrease burden of Care through IP rehab admission: NA   Possible need for SNF placement upon discharge: Not anticipated    Patient Condition: I have reviewed medical records from [facility], spoken with CM, and patient and daughter. I met with patient at the bedside and discussed via phone for inpatient rehabilitation assessment.  Patient will benefit from ongoing PT, OT, and SLP, can actively participate in 3 hours of therapy a day 5 days of the week, and can make measurable gains during the admission.  Patient will also benefit from the coordinated team approach during an Inpatient Acute Rehabilitation admission.  The patient will receive intensive therapy as well as Rehabilitation physician, nursing, social worker, and care management interventions.  Due to safety, disease management, medication administration, and patient education the patient requires 24 hour a day rehabilitation nursing.  The patient is currently Min G-Mod A with mobility and Set up-Mod A with basic ADLs.  Discharge setting and therapy post discharge at home with home health is anticipated.  Patient has agreed to participate in the Acute Inpatient Rehabilitation Program and will admit today.   Preadmission Screen Completed By:  Bethel Born, 05/15/2021 10:14 AM ______________________________________________________________________   Discussed status with Dr. Ranell Patrick on 05/15/21  at 10:14 AM and received approval for admission today.   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 Yes Co-Morbidities requiring supervision/potential complications: HTN, HLD, DM2, obesity (BMI 32.90), left leg numbness Due to bladder management, bowel management, safety, skin/wound care, disease management, medication  administration, pain management, and patient education, does the patient require 24 hr/day rehab nursing? Yes Does the patient require coordinated care of a physician, rehab nurse, PT, OT, and SLP to address physical and functional deficits in the context of the above medical diagnosis(es)? Yes Addressing deficits in the following areas: balance, endurance, locomotion, strength, transferring, bowel/bladder control, bathing, dressing, feeding, grooming, toileting, cognition, speech, and psychosocial support Can the patient actively participate in an intensive therapy program of at least 3 hrs of therapy 5 days a week? Yes The potential for patient to make measurable  gains while on inpatient rehab is excellent Anticipated functional outcomes upon discharge from inpatient rehab: supervision PT, supervision OT, supervision SLP Estimated rehab length of stay to reach the above functional goals is: 7-8 days Anticipated discharge destination: Home 10. Overall Rehab/Functional Prognosis: excellent     MD Signature: Leeroy Cha, MD

## 2021-05-15 NOTE — Discharge Instructions (Addendum)
Inpatient Rehab Discharge Instructions  [place] Discharge date and time: No discharge date for patient encounter.   Activities/Precautions/ Functional Status: Activity: activity as tolerated Diet: Dysphagia #2 thin liquids Wound Care: Routine skin checks Functional status:  ___ No restrictions     ___ Walk up steps independently ___ 24/7 supervision/assistance   ___ Walk up steps with assistance ___ Intermittent supervision/assistance  ___ Bathe/dress independently ___ Walk with walker     __x_ Bathe/dress with assistance ___ Walk Independently    ___ Shower independently ___ Walk with assistance    ___ Shower with assistance ___ No alcohol     ___ Return to work/school ________  Special Instructions: Continue aspirin 81 mg daily and Plavix 75 mg daily x3 months total and aspirin alone    COMMUNITY REFERRALS UPON DISCHARGE:    Home Health:   PT, OT, [place] Phone: [phone]  Medical Equipment/Items Ordered:ROLLING WALKER & TUB BENCH                                                 Agency/Supplier:[facility]  [phone]  [facility] HAS REFERRAL AND DAUGHTER'S CONTACT INFORMATION  MOBILE MEALS TO BEGIN NEXT WEEK-DAUGHTER AWARE     My questions have been answered and I understand these instructions. I will adhere to these goals and the provided educational materials after my discharge from the hospital.  Patient/Caregiver Signature _______________________________ Date __________  Clinician Signature _______________________________________ Date __________  Please bring this form and your medication list with you to all your follow-up doctor's appointments.  STROKE/TIA DISCHARGE INSTRUCTIONS SMOKING Cigarette smoking nearly doubles your risk of having a stroke & is the single most alterable risk factor  If you smoke or have smoked in the last 12 months, you are advised to quit smoking for your health. Most of  the excess cardiovascular risk related to smoking disappears within a year of stopping. Ask you doctor about anti-smoking medications [place] Quit Line: 1-800-QUIT NOW Free Smoking Cessation Classes [phone]  CHOLESTEROL Know your levels; limit fat & cholesterol in your diet  Lipid Panel     Component Value Date/Time   CHOL 198 05/09/2021 0519   TRIG 220 (H) 05/09/2021 0519   HDL 35 (L) 05/09/2021 0519   CHOLHDL 5.7 05/09/2021 0519   VLDL 44 (H) 05/09/2021 0519   LDLCALC 119 (H) 05/09/2021 0519     Many patients benefit from treatment even if their cholesterol is at goal. Goal: Total Cholesterol (CHOL) less than 160 Goal:  Triglycerides (TRIG) less than 150 Goal:  HDL greater than 40 Goal:  LDL (LDLCALC) less than 100   BLOOD PRESSURE American Stroke Association blood pressure target is less that 120/80 mm/Hg  Your discharge blood pressure is:    Monitor your blood pressure Limit your salt and alcohol intake Many individuals will require more than one medication for high blood pressure  DIABETES (A1c is a blood sugar average for last 3 months) Goal HGBA1c is under 7% (HBGA1c is blood sugar average for last 3 months)  Diabetes:   Lab Results  Component Value Date   HGBA1C 7.1 (H) 05/09/2021    Your HGBA1c can be lowered with medications, healthy diet, and exercise. Check your blood sugar as  directed by your physician Call your physician if you experience unexplained or low blood sugars.  PHYSICAL ACTIVITY/REHABILITATION Goal is 30 minutes at least 4 days per week  Activity: Increase activity slowly, Therapies: Physical Therapy: Home Health Return to work:  Activity decreases your risk of heart attack and stroke and makes your heart stronger.  It helps control your weight and blood pressure; helps you relax and can improve your mood. Participate in a regular exercise program. Talk with your doctor about the best form of exercise for you (dancing, walking, swimming, cycling).   DIET/WEIGHT Goal is to maintain a healthy weight  Your discharge diet is:  Diet Order     None       liquids Your height is:    Your current weight is:   Your Body Mass Index (BMI) is:    Following the type of diet specifically designed for you will help prevent another stroke. Your goal weight range is:   Your goal Body Mass Index (BMI) is 19-24. Healthy food habits can help reduce 3 risk factors for stroke:  High cholesterol, hypertension, and excess weight.  RESOURCES Stroke/Support Group:  Call [phone]   STROKE EDUCATION PROVIDED/REVIEWED AND GIVEN TO PATIENT Stroke warning signs and symptoms How to activate emergency medical system (call 911). Medications prescribed at discharge. Need for follow-up after discharge. Personal risk factors for stroke. Pneumonia vaccine given: No Flu vaccine given: No My questions have been answered, the writing is legible, and I understand these instructions.  I will adhere to these goals & educational materials that have been provided to me after my discharge from the hospital.

## 2021-05-15 NOTE — Progress Notes (Signed)
PT Cancellation Note  Patient Details Name: Madison Davidson MRN: [id number] DOB: [birth year]   Cancelled Treatment:    Reason Eval/Treat Not Completed: Other (comment) patient not in room/not available for PT. Will attempt to return if time/schedule allow.   Windell Norfolk, DPT, PN1   Supplemental Physical Therapist [facility]    Pager [phone] Acute Rehab Office [phone]

## 2021-05-15 NOTE — Progress Notes (Signed)
Patient arrived on the unit in wheelchair with daughter at bedside, A&O at the time. Denied pain or discomfort. Educated on safety plan and POC. We continue to monitor.

## 2021-05-15 NOTE — Progress Notes (Signed)
Patient was discharged and taken to 4W01. All belongings taken with patient and her daughter. VS stable. IV d/c'd. Skin intact.

## 2021-05-15 NOTE — Progress Notes (Signed)
Inpatient Rehabilitation Medication Review by a Pharmacist  A complete drug regimen review was completed for this patient to identify any potential clinically significant medication issues.  Clinically significant medication issues were identified:  no  Check AMION for pharmacist assigned to patient if future medication questions/issues arise during this admission.  Pharmacist comments: none  Time spent performing this drug regimen review (minutes):  5-10 min  Kaidynce Pfister S. Alford Highland, PharmD, BCPS Clinical Staff Pharmacist [email or web address] [place] 05/15/2021 3:02 PM

## 2021-05-15 NOTE — Discharge Summary (Signed)
Physician Discharge Summary  SHANIQWA HORSMAN CVK:[id number] DOB: 1951/06/06 DOA: 05/11/2021  PCP: [facility] [place] date: 05/11/2021 Discharge date: 05/15/2021  Admitted From: Home > [facility]  Disposition:  CIR   Recommendations for Outpatient Follow-up:  Follow up with PCP in 1-2 weeks Follow up with Neurology in 4-6 weeks     Home Health: N/A  Equipment/Devices: TBD at SNF  Discharge Condition: Good  CODE STATUS: FULL Diet recommendation: Cardiac  Brief/Interim Summary: Mrs. Selmer is a 70 y.o. F with DM, HTN, hypothyroidism, obesity, CAD, and prior CVA who initially presented to [facility] ED on 05/08/21 due to transient slurring of speech.  She was admitted for stroke work-up.    Work-up revealed severe symptomatic right MCA stenosis.  At [facility] she was followed by neurology who recommended transfer to [facility] for urgent but not emergent intervention, possible intracranial stenting of the right MCA origin and bifurcation.      PRINCIPAL HOSPITAL DIAGNOSIS: Acute stroke    Discharge Diagnoses:    Acute right frontal lobe stroke Intracranial atherosclerosis Severe symptomatic right MCA stenosis S/P IR stenting right distal M1 MCA  on 6/16   Presented with transient slurring of speech at [facility]. Work-up revealed severe symptomatic right MCA stenosis  -Echocardiogram showed no cardiogenic source of embolism -Carotid imaging unremarkable   -Lipids ordered: discharged on atorvastatin -Aspirin ordered at admission --> discharged on aspirin and Plavix -Atrial fibrillation: not detected -tPA not given because did not meet criteria -Dysphagia screen ordered in ER -PT eval ordered: recommended CIR -Smoking cessation: Not pertinent, never smoker       Hyperlipidemia Discharged on statin, may need PCSK9 inhibitor at home     Type 2 diabetes with hyperglycemia Hemoglobin A1c 7.1 on 05/09/2021    Hypothyroidism Continue levothyroxine   Chronic  anxiety/depression  Obesity BMI 32           Discharge Instructions  Discharge Instructions     Activity as tolerated - No restrictions   Complete by: As directed    Ambulatory referral to Neurology   Complete by: As directed    Follow up with stroke clinic NP (Jessica Vanschaick or Cecille Rubin, if both not available, consider Zachery Dauer, or Ahern) at [facility] in about 4 weeks. Thanks.   Diet - low sodium heart healthy   Complete by: As directed    Discharge instructions   Complete by: As directed    Continue current plan dual antiplatelet therapy for 1 month as instructed, continue high-dose statins, follow-up with a neurologist in 2-4 weeks   Increase activity slowly   Complete by: As directed       Allergies as of 05/15/2021   No Known Allergies      Medication List     STOP taking these medications    meloxicam 15 MG tablet Commonly known as: MOBIC   nortriptyline 10 MG capsule Commonly known as: PAMELOR       TAKE these medications    aspirin EC 81 MG tablet Take 81 mg by mouth daily as needed (headache).   atorvastatin 20 MG tablet Commonly known as: LIPITOR Take 4 tablets (80 mg total) by mouth at bedtime. What changed: how much to take   citalopram 10 MG tablet Commonly known as: CELEXA Take 10 mg by mouth daily.   clopidogrel 75 MG tablet Commonly known as: PLAVIX Take 1 tablet (75 mg total) by mouth daily.   glipiZIDE 10 MG tablet Commonly known as: GLUCOTROL Take  10 mg by mouth 2 (two) times daily before a meal.   insulin aspart 100 UNIT/ML injection Commonly known as: novoLOG Inject 0-15 Units into the skin 3 (three) times daily with meals.   levothyroxine 125 MCG tablet Commonly known as: SYNTHROID Take 1 tablet (125 mcg total) by mouth daily at 6 (six) AM.   lisinopril 10 MG tablet Commonly known as: ZESTRIL Take 1 tablet (10 mg total) by mouth daily.   melatonin 3 MG Tabs tablet Take 1 tablet (3 mg total) by mouth  at bedtime as needed.   metFORMIN 500 MG tablet Commonly known as: GLUCOPHAGE Take 1,000 mg by mouth 2 (two) times daily with a meal.        Follow-up Information     [facility]. Schedule an appointment as soon as possible for a visit in 1 month(s).   Specialty: Neurology Why: stroke clinic Contact information: [place] [place] [phone]        de Rosario Jacks, MD Follow up.   Specialties: Radiology, Interventional Radiology Why: IR scheduler will call you to schedule diagnostic cerebral angiogram (about 6 months from time of your procedure). Please call [phone] or [phone] with any questions or concerns prior to your appoitnment. Contact information: [place] [address] [phone]                No Known Allergies  Consultations: Neurology IR   Procedures/Studies: CT ANGIO HEAD W OR WO CONTRAST  Result Date: 05/09/2021 CLINICAL DATA:  Acute stroke on MRI brain EXAM: CT ANGIOGRAPHY HEAD TECHNIQUE: Multidetector CT imaging of the head was performed using the standard protocol during bolus administration of intravenous contrast. Multiplanar CT image reconstructions and MIPs were obtained to evaluate the vascular anatomy. CONTRAST:  13m OMNIPAQUE IOHEXOL 350 MG/ML SOLN COMPARISON:  CT 05/08/2021, correlation made with MRI brain 05/08/2021 FINDINGS: CT HEAD Brain: Evolving recent infarction is again identified within the right corona radiata. Small area of right frontal cortical involvement on the MRI is not visible by CT. There is no acute intracranial hemorrhage. No significant mass effect. Chronic infarct of the left basal ganglia and adjacent white matter with ex vacuo dilatation of the adjacent lateral ventricle. No hydrocephalus. No extra-axial collection. Vascular: There is atherosclerotic calcification at the skull base. Skull: Calvarium is  unremarkable. Sinuses/Orbits: No acute finding. Other: None. CTA HEAD Anterior circulation: Intracranial internal carotid arteries are patent with calcified plaque but no significant stenosis. Anterior cerebral arteries are patent. There is eccentric plaque at the origin of the right MCA with marked stenosis. High-grade stenosis is present at the right MCA bifurcation. Left MCA is patent. There is moderate stenosis of an anterior division M2 branch. Posterior circulation: Dominant intracranial right vertebral artery is patent. Proximal intracranial left vertebral artery is irregular with diminished enhancement. There is improved opacification more distally beginning at the PICA origin, which may reflect retrograde flow. Basilar artery is patent. Major cerebellar artery origins are patent. A left posterior communicating artery is present. There is irregularity of the left P2 PCA with moderate to marked stenosis. Venous sinuses: As permitted by contrast timing, patent. Review of the MIP images confirms the above findings. IMPRESSION: Evolving recent infarction of the right corona radiata. No acute intracranial hemorrhage. 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 Moderate left M2 MCA branch stenosis. Diffusely irregular left vertebral artery with diminished flow. There is improvement beginning at the PICA  origin likely from retrograde flow. Irregular left P2 PCA with moderate to marked stenosis. Electronically Signed   By: Macy Mis M.D.   On: 05/09/2021 14:35   CT Head Wo Contrast  Result Date: 05/08/2021 CLINICAL DATA:  TIA.  Weakness and left knee pain EXAM: CT HEAD WITHOUT CONTRAST TECHNIQUE: Contiguous axial images were obtained from the base of the skull through the vertex without intravenous contrast. COMPARISON:  CT head 09/13/2017 FINDINGS: Brain: Ventricle size and cerebral volume normal for age. Ill-defined hypodensities in the right frontal white matter are new since the prior  study. These are compatible with infarct of indeterminate age, possibly subacute. Well-defined hypodensity left basal ganglia and internal capsule anteriorly compatible with chronic infarct. This was not present previously. Negative for hemorrhage or mass. Vascular: Negative for hyperdense vessel Skull: Negative Sinuses/Orbits: Negative Other: None IMPRESSION: Ill-defined hypodensities right frontal white matter compatible with ischemia of indeterminate age. Possible subacute Chronic infarct left basal ganglia No acute hemorrhage. Electronically Signed   By: Franchot Gallo M.D.   On: 05/08/2021 13:39   MR BRAIN WO CONTRAST  Result Date: 05/08/2021 CLINICAL DATA:  Weakness. EXAM: MRI HEAD WITHOUT CONTRAST TECHNIQUE: Multiplanar, multiecho pulse sequences of the brain and surrounding structures were obtained without intravenous contrast. COMPARISON:  Head CT 05/08/2021 FINDINGS: Brain: There are acute to early subacute infarcts involving white matter in the right frontal lobe primarily at the level of the corona radiata, and there is also a single subcentimeter acute or early subacute cortical infarct laterally in the right frontal lobe. There is a chronic infarct in the left basal ganglia with associated chronic blood products. There is also a small chronic cortical infarct in the posteroinferior left occipital lobe. Mild cerebral atrophy is within normal limits for age. No mass, midline shift, or extra-axial fluid collection is evident. Vascular: Major intracranial vascular flow voids are preserved. Skull and upper cervical spine: Unremarkable bone marrow signal. Sinuses/Orbits: Unremarkable orbits. Paranasal sinuses and mastoid air cells are clear. Other: None. IMPRESSION: 1. Acute to early subacute right frontal lobe infarcts. 2. 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 Electronically Signed   By: Logan Bores M.D.   On: 05/08/2021 16:58   IR Angiogram Extremity Left  Result Date:  05/12/2021 INDICATION: KEIONDRA BROOKOVER is a 70 y.o. female with a past medical history significant for anemia, DM, HLD, CAD, HTN and prior CVA who presented to [facility] ED via EMS on 6/13 with complaints of weakness and slurred speech the day prior which had since resolved, per her daughter she had not been taking her medications as prescribed. She was noted to be hypertensive at 171/60 mmHg. Her neurologic exam was benign while in bed, however, patient presented left-sided paresthesia with ambulation. 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 CTA head showed evolving recent infarction of the right corona radiata without acute intracranial hemorrhage as well as marked stenoses of the right MCA origin and bifurcation, moderate left M2 MCA branch stenosis, diffusely irregular left vertebral artery with diminished flow and irregular left P2 PCA with moderate to marked stenosis. She has been transferred to [facility] for planned right MCA angioplasty/stent placement. EXAM: FLUOROSCOPY GUIDED VASCULAR ACCESS DIAGNOSTIC CEREBRAL ANGIOGRAM INTRACRANIAL ANGIOPLASTY AND STENTING, RIGHT MCA FLAT PANEL HEAD CT COMPARISON:  CT/CT angiogram of the head May 09, 2021 MEDICATIONS: Refer to anesthesia documentation. ANESTHESIA/SEDATION: The procedure was performed under general anesthesia CONTRAST:  120 mL of Omnipaque 240 milligram/mL FLUOROSCOPY TIME:  Fluoroscopy Time: 75 minutes 6 seconds (1,935 mGy). COMPLICATIONS: None immediate. TECHNIQUE: Informed written consent was obtained from the patient after a thorough discussion of the procedural risks, benefits and alternatives. All questions were addressed. Maximal Sterile Barrier Technique was utilized including caps, mask, sterile gowns, sterile gloves, sterile drape, hand hygiene and skin antiseptic. A timeout was performed prior to the initiation of the procedure. Using the modified  Seldinger technique and a micropuncture kit, access was gained to the distal right radial artery at the anatomical snuffbox and a 6 French sheath was placed. Real-time ultrasound guidance was utilized for vascular access including the acquisition of a permanent ultrasound image documenting patency of the accessed vessel. Slow intra arterial infusion of 5,000 IU heparin, 5 mg Verapamil and 194 mcg nitroglicerin diluted in patient's own blood was performed. No significant fluctuation in patient's blood pressure seen. Then, a right radial artery roadmap was obtained via sheath side port. Normal brachial artery branching pattern seen. No significant anatomical variation. The right radial artery caliber is adequate for vascular access. Next, a 5 [place] Simmons 2 glide catheter was navigated over a 0.035" Terumo Glidewire into the right subclavian artery under fluoroscopic guidance. Frontal angiogram of the neck was obtained. The catheter was advanced into the aortic arch. The catheter tip was reformed. The catheter was then advanced into the left subclavian artery. Frontal angiogram of the neck was obtained. The catheter was subsequently placed into the left common carotid artery. Frontal and lateral angiograms of the neck were obtained. Under biplane roadmap, the catheter was then advanced into the left internal carotid artery. Frontal and lateral angiograms of the head were obtained. The catheter was again placed into the right subclavian artery. Frontal and lateral angiograms of the head were obtained. The the catheter was then placed clear into the right common carotid artery. Frontal and lateral angiograms of the neck were obtained. Under biplane roadmap, the the catheter was then advanced into the right internal carotid artery. Frontal and lateral angiograms of the head were obtained. FINDINGS: 1. Normal course and caliber of the cervical right vertebral artery. 2. Left subclavian artery angiograms showed no  evidence of opacification of the left vertebral artery, likely related to occlusion. 3. Mild atherosclerotic changes of the left carotid bifurcation without hemodynamically significant stenosis. 4. Severe stenosis of the P2 segment of the left fetal PCA. 5. Mild stenosis of a left M3/MCA posterior division branch. 6. No significant atherosclerotic changes or stenosis of the right carotid bifurcation. 7. Atherosclerotic changes of the right carotid siphons with mild stenosis at the paraclinoid segment. 8. Approximately 50% stenosis at the origin of the right M1/MCA segment. 9. Approximately 50% stenosis at the origin of a right inferior temporal branch. 10. Severe, hairline stenosis of the distal right M1/MCA segment, involving the origin of both anterior and posterior division branches. There is significant delay in opacification of the anterior division branches. PROCEDURE: This into catheter was exchanged over the wire and under biplane roadmap for a benchmark guide catheter which was placed in the distal cervical segment of the right ICA. Magnified frontal and lateral views of the head were obtained in the working projections. Then, attempted navigation of a 1.25 x 10 mm sapphire balloon over a 0.014 inch Aristotle microguidewire proved unsuccessful due to siphon tortuosity. A headway duo microcatheter was navigated over the Aristotle with a wire into the right M2/MCA posterior division branch, distal to the stenosis. The wire was  exchanged for a zoom 14 exchange micro guidewire. The microcatheter was then exchanged for the 1.25 x 10 mm sapphire balloon. Angioplasty was performed under fluoroscopy at the distal right M1/MCA. Right internal carotid artery angiograms with frontal lateral views showed in proved flow in the distal right MCA branches. Attempted navigation of a 2 mm resolute onyx balloon mounted stent into the right M1/MCA proved unsuccessful due to siphon tortuosity. Then, a 2 x 8 mm apex balloon was  navigated to the level of stenosis. Additional angioplasty was performed under fluoroscopy. Right internal carotid angiograms with magnified frontal and lateral views showed additional improvement of the degree of stenosis. The headway duo microcatheter was again navigated over the wire into the right M2/MCA posterior division branch. Subsequently, a 3 x 24 mm neuroform atlas stent was deployed in the distal right M1/MCA segment extending to the proximal M2 posterior division branch. Follow-up angiogram showed adequate stent positioning across the stenosis with significant improvement of the degree of stenosis and anterograde flow in the posterior division branch. Persistent slow flow in the anterior division branch noted. Attempted navigation of the headway duo over the 14 wire through the stent proved unsuccessful. Attempted navigation of the apex balloon also proved unsuccessful. The 2 mm apex balloon was then navigated into the proximal right M1/MCA. Angioplasty was performed under fluoroscopy. Follow-up angiograms showed mild improvement of the proximal right M1/MCA stenosis (now approximately 35%). Further attempts to navigate the balloon for the microcatheter through the previously deployed stent proved unsuccessful. The catheter was subsequently withdrawn. Follow-up angiograms with frontal and lateral views showed stable position and opacification of the stent. Delayed opacification of the anterior division branches noted, although improved from baseline. Flat panel CT of the head was obtained and post processed in a separate workstation with concurrent attending physician supervision. Selected images were sent to PACS. No evidence of hemorrhagic complication. IMPRESSION: 1. Severe intracranial atherosclerotic disease with severe hairline stenosis of the distal right M1/MCA segment extending into the origins of the superior and inferior divisions and severe stenosis of the left P2/PCA. Mild (50%) stenosis of  the proximal right M1/MCA and at the origin of the anterior temporal branch. 2. Angioplasty and stenting performed for treatment of the area of severe stenosis at the distal right M1/MCA segment with improvement of the anterograde flow. Persistent delayed, although improved, flow to the anterior division branch. No further angioplasty or stenting performed due to lack of access through the recently deployed stent. 3. Angioplasty performed at the proximal right M1/MCA segment with approximately 35% residual stenosis. PLAN: Continue on dual antiplatelet therapy for 6 month when stent patency should be evaluated. Electronically Signed   By: Pedro Earls M.D.   On: 05/12/2021 15:22   US Carotid Bilateral (at [facility] and [facility] only)  Result Date: 05/09/2021 CLINICAL DATA:  70 year old female with a history of CVA EXAM: BILATERAL CAROTID DUPLEX ULTRASOUND TECHNIQUE: Pearline Cables scale imaging, color Doppler and duplex ultrasound were performed of bilateral carotid and vertebral arteries in the neck. COMPARISON:  None. FINDINGS: Criteria: Quantification of carotid stenosis is based on velocity parameters that correlate the residual internal carotid diameter with NASCET-based stenosis levels, using the diameter of the distal internal carotid lumen as the denominator for stenosis measurement. The following velocity measurements were obtained: RIGHT ICA:  Systolic 235 cm/sec, Diastolic 19 cm/sec CCA:  573 cm/sec SYSTOLIC ICA/CCA RATIO:  1.5 ECA:  128 cm/sec LEFT ICA:  Systolic 95 cm/sec, Diastolic 11 cm/sec CCA:  84 cm/sec SYSTOLIC  ICA/CCA RATIO:  1.1 ECA:  157 cm/sec Right Brachial SBP: Not acquired Left Brachial SBP: Not acquired RIGHT CAROTID ARTERY: No significant calcified disease of the right common carotid artery. Intermediate waveform maintained. Heterogeneous plaque without significant calcifications at the right carotid bifurcation. Low resistance waveform of the right ICA. No significant tortuosity. RIGHT  VERTEBRAL ARTERY: Antegrade flow with low resistance waveform. LEFT CAROTID ARTERY: No significant calcified disease of the left common carotid artery. Intermediate waveform maintained. Heterogeneous plaque at the left carotid bifurcation without significant calcifications. Low resistance waveform of the left ICA. LEFT VERTEBRAL ARTERY: Left vertebral artery not identified. IMPRESSION: Color duplex indicates moderate heterogeneous plaque with no hemodynamically significant stenosis by duplex criteria in the extracranial cerebrovascular circulation. Left vertebral artery waveform not identified. Signed, Dulcy Fanny. Dellia Nims, RPVI [facility] Electronically Signed   By: Corrie Mckusick D.O.   On: 05/09/2021 08:19   IR Intra Cran Stent  Result Date: 05/12/2021 INDICATION: SHERITA DECOSTE is a 70 y.o. female with a past medical history significant for anemia, DM, HLD, CAD, HTN and prior CVA who presented to [facility] ED via EMS on 6/13 with complaints of weakness and slurred speech the day prior which had since resolved, per her daughter she had not been taking her medications as prescribed. She was noted to be hypertensive at 171/60 mmHg. Her neurologic exam was benign while in bed, however, patient presented left-sided paresthesia with ambulation. 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 CTA head showed evolving recent infarction of the right corona radiata without acute intracranial hemorrhage as well as marked stenoses of the right MCA origin and bifurcation, moderate left M2 MCA branch stenosis, diffusely irregular left vertebral artery with diminished flow and irregular left P2 PCA with moderate to marked stenosis. She has been transferred to [facility] for planned right MCA angioplasty/stent placement. EXAM: FLUOROSCOPY GUIDED VASCULAR ACCESS DIAGNOSTIC CEREBRAL  ANGIOGRAM INTRACRANIAL ANGIOPLASTY AND STENTING, RIGHT MCA FLAT PANEL HEAD CT COMPARISON:  CT/CT angiogram of the head May 09, 2021 MEDICATIONS: Refer to anesthesia documentation. ANESTHESIA/SEDATION: The procedure was performed under general anesthesia CONTRAST:  120 mL of Omnipaque 240 milligram/mL FLUOROSCOPY TIME:  Fluoroscopy Time: 75 minutes 6 seconds (1,935 mGy). COMPLICATIONS: None immediate. TECHNIQUE: Informed written consent was obtained from the patient after a thorough discussion of the procedural risks, benefits and alternatives. All questions were addressed. Maximal Sterile Barrier Technique was utilized including caps, mask, sterile gowns, sterile gloves, sterile drape, hand hygiene and skin antiseptic. A timeout was performed prior to the initiation of the procedure. Using the modified Seldinger technique and a micropuncture kit, access was gained to the distal right radial artery at the anatomical snuffbox and a 6 French sheath was placed. Real-time ultrasound guidance was utilized for vascular access including the acquisition of a permanent ultrasound image documenting patency of the accessed vessel. Slow intra arterial infusion of 5,000 IU heparin, 5 mg Verapamil and 509 mcg nitroglicerin diluted in patient's own blood was performed. No significant fluctuation in patient's blood pressure seen. Then, a right radial artery roadmap was obtained via sheath side port. Normal brachial artery branching pattern seen. No significant anatomical variation. The right radial artery caliber is adequate for vascular access. Next, a 5 [place] Simmons 2 glide catheter was navigated over a 0.035" Terumo Glidewire into the right subclavian artery under fluoroscopic guidance. Frontal angiogram of the neck was obtained.  The catheter was advanced into the aortic arch. The catheter tip was reformed. The catheter was then advanced into the left subclavian artery. Frontal angiogram of the neck was obtained. The catheter  was subsequently placed into the left common carotid artery. Frontal and lateral angiograms of the neck were obtained. Under biplane roadmap, the catheter was then advanced into the left internal carotid artery. Frontal and lateral angiograms of the head were obtained. The catheter was again placed into the right subclavian artery. Frontal and lateral angiograms of the head were obtained. The the catheter was then placed clear into the right common carotid artery. Frontal and lateral angiograms of the neck were obtained. Under biplane roadmap, the the catheter was then advanced into the right internal carotid artery. Frontal and lateral angiograms of the head were obtained. FINDINGS: 1. Normal course and caliber of the cervical right vertebral artery. 2. Left subclavian artery angiograms showed no evidence of opacification of the left vertebral artery, likely related to occlusion. 3. Mild atherosclerotic changes of the left carotid bifurcation without hemodynamically significant stenosis. 4. Severe stenosis of the P2 segment of the left fetal PCA. 5. Mild stenosis of a left M3/MCA posterior division branch. 6. No significant atherosclerotic changes or stenosis of the right carotid bifurcation. 7. Atherosclerotic changes of the right carotid siphons with mild stenosis at the paraclinoid segment. 8. Approximately 50% stenosis at the origin of the right M1/MCA segment. 9. Approximately 50% stenosis at the origin of a right inferior temporal branch. 10. Severe, hairline stenosis of the distal right M1/MCA segment, involving the origin of both anterior and posterior division branches. There is significant delay in opacification of the anterior division branches. PROCEDURE: This into catheter was exchanged over the wire and under biplane roadmap for a benchmark guide catheter which was placed in the distal cervical segment of the right ICA. Magnified frontal and lateral views of the head were obtained in the working  projections. Then, attempted navigation of a 1.25 x 10 mm sapphire balloon over a 0.014 inch Aristotle microguidewire proved unsuccessful due to siphon tortuosity. A headway duo microcatheter was navigated over the Aristotle with a wire into the right M2/MCA posterior division branch, distal to the stenosis. The wire was exchanged for a zoom 14 exchange micro guidewire. The microcatheter was then exchanged for the 1.25 x 10 mm sapphire balloon. Angioplasty was performed under fluoroscopy at the distal right M1/MCA. Right internal carotid artery angiograms with frontal lateral views showed in proved flow in the distal right MCA branches. Attempted navigation of a 2 mm resolute onyx balloon mounted stent into the right M1/MCA proved unsuccessful due to siphon tortuosity. Then, a 2 x 8 mm apex balloon was navigated to the level of stenosis. Additional angioplasty was performed under fluoroscopy. Right internal carotid angiograms with magnified frontal and lateral views showed additional improvement of the degree of stenosis. The headway duo microcatheter was again navigated over the wire into the right M2/MCA posterior division branch. Subsequently, a 3 x 24 mm neuroform atlas stent was deployed in the distal right M1/MCA segment extending to the proximal M2 posterior division branch. Follow-up angiogram showed adequate stent positioning across the stenosis with significant improvement of the degree of stenosis and anterograde flow in the posterior division branch. Persistent slow flow in the anterior division branch noted. Attempted navigation of the headway duo over the 14 wire through the stent proved unsuccessful. Attempted navigation of the apex balloon also proved unsuccessful. The 2 mm apex balloon was then navigated  into the proximal right M1/MCA. Angioplasty was performed under fluoroscopy. Follow-up angiograms showed mild improvement of the proximal right M1/MCA stenosis (now approximately 35%). Further  attempts to navigate the balloon for the microcatheter through the previously deployed stent proved unsuccessful. The catheter was subsequently withdrawn. Follow-up angiograms with frontal and lateral views showed stable position and opacification of the stent. Delayed opacification of the anterior division branches noted, although improved from baseline. Flat panel CT of the head was obtained and post processed in a separate workstation with concurrent attending physician supervision. Selected images were sent to PACS. No evidence of hemorrhagic complication. IMPRESSION: 1. Severe intracranial atherosclerotic disease with severe hairline stenosis of the distal right M1/MCA segment extending into the origins of the superior and inferior divisions and severe stenosis of the left P2/PCA. Mild (50%) stenosis of the proximal right M1/MCA and at the origin of the anterior temporal branch. 2. Angioplasty and stenting performed for treatment of the area of severe stenosis at the distal right M1/MCA segment with improvement of the anterograde flow. Persistent delayed, although improved, flow to the anterior division branch. No further angioplasty or stenting performed due to lack of access through the recently deployed stent. 3. Angioplasty performed at the proximal right M1/MCA segment with approximately 35% residual stenosis. PLAN: Continue on dual antiplatelet therapy for 6 month when stent patency should be evaluated. Electronically Signed   By: Pedro Earls M.D.   On: 05/12/2021 15:22   IR Intra Cran Stent  Result Date: 05/12/2021 INDICATION: MEIYA WISLER is a 70 y.o. female with a past medical history significant for anemia, DM, HLD, CAD, HTN and prior CVA who presented to [facility] ED via EMS on 6/13 with complaints of weakness and slurred speech the day prior which had since resolved, per her daughter she had not been taking her medications as prescribed. She was noted to be hypertensive at  171/60 mmHg. Her neurologic exam was benign while in bed, however, patient presented left-sided paresthesia with ambulation. 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 CTA head showed evolving recent infarction of the right corona radiata without acute intracranial hemorrhage as well as marked stenoses of the right MCA origin and bifurcation, moderate left M2 MCA branch stenosis, diffusely irregular left vertebral artery with diminished flow and irregular left P2 PCA with moderate to marked stenosis. She has been transferred to [facility] for planned right MCA angioplasty/stent placement. EXAM: FLUOROSCOPY GUIDED VASCULAR ACCESS DIAGNOSTIC CEREBRAL ANGIOGRAM INTRACRANIAL ANGIOPLASTY AND STENTING, RIGHT MCA FLAT PANEL HEAD CT COMPARISON:  CT/CT angiogram of the head May 09, 2021 MEDICATIONS: Refer to anesthesia documentation. ANESTHESIA/SEDATION: The procedure was performed under general anesthesia CONTRAST:  120 mL of Omnipaque 240 milligram/mL FLUOROSCOPY TIME:  Fluoroscopy Time: 75 minutes 6 seconds (1,935 mGy). COMPLICATIONS: None immediate. TECHNIQUE: Informed written consent was obtained from the patient after a thorough discussion of the procedural risks, benefits and alternatives. All questions were addressed. Maximal Sterile Barrier Technique was utilized including caps, mask, sterile gowns, sterile gloves, sterile drape, hand hygiene and skin antiseptic. A timeout was performed prior to the initiation of the procedure. Using the modified Seldinger technique and a micropuncture kit, access was gained to the distal right radial artery at the anatomical snuffbox and a 6 French sheath was placed. Real-time ultrasound guidance was utilized for vascular access including the acquisition of a permanent ultrasound image documenting patency of the accessed vessel.  Slow intra arterial infusion of 5,000 IU heparin, 5 mg  Verapamil and 734 mcg nitroglicerin diluted in patient's own blood was performed. No significant fluctuation in patient's blood pressure seen. Then, a right radial artery roadmap was obtained via sheath side port. Normal brachial artery branching pattern seen. No significant anatomical variation. The right radial artery caliber is adequate for vascular access. Next, a 5 [place] Simmons 2 glide catheter was navigated over a 0.035" Terumo Glidewire into the right subclavian artery under fluoroscopic guidance. Frontal angiogram of the neck was obtained. The catheter was advanced into the aortic arch. The catheter tip was reformed. The catheter was then advanced into the left subclavian artery. Frontal angiogram of the neck was obtained. The catheter was subsequently placed into the left common carotid artery. Frontal and lateral angiograms of the neck were obtained. Under biplane roadmap, the catheter was then advanced into the left internal carotid artery. Frontal and lateral angiograms of the head were obtained. The catheter was again placed into the right subclavian artery. Frontal and lateral angiograms of the head were obtained. The the catheter was then placed clear into the right common carotid artery. Frontal and lateral angiograms of the neck were obtained. Under biplane roadmap, the the catheter was then advanced into the right internal carotid artery. Frontal and lateral angiograms of the head were obtained. FINDINGS: 1. Normal course and caliber of the cervical right vertebral artery. 2. Left subclavian artery angiograms showed no evidence of opacification of the left vertebral artery, likely related to occlusion. 3. Mild atherosclerotic changes of the left carotid bifurcation without hemodynamically significant stenosis. 4. Severe stenosis of the P2 segment of the left fetal PCA. 5. Mild stenosis of a left M3/MCA posterior division branch. 6. No significant atherosclerotic changes or stenosis of the right  carotid bifurcation. 7. Atherosclerotic changes of the right carotid siphons with mild stenosis at the paraclinoid segment. 8. Approximately 50% stenosis at the origin of the right M1/MCA segment. 9. Approximately 50% stenosis at the origin of a right inferior temporal branch. 10. Severe, hairline stenosis of the distal right M1/MCA segment, involving the origin of both anterior and posterior division branches. There is significant delay in opacification of the anterior division branches. PROCEDURE: This into catheter was exchanged over the wire and under biplane roadmap for a benchmark guide catheter which was placed in the distal cervical segment of the right ICA. Magnified frontal and lateral views of the head were obtained in the working projections. Then, attempted navigation of a 1.25 x 10 mm sapphire balloon over a 0.014 inch Aristotle microguidewire proved unsuccessful due to siphon tortuosity. A headway duo microcatheter was navigated over the Aristotle with a wire into the right M2/MCA posterior division branch, distal to the stenosis. The wire was exchanged for a zoom 14 exchange micro guidewire. The microcatheter was then exchanged for the 1.25 x 10 mm sapphire balloon. Angioplasty was performed under fluoroscopy at the distal right M1/MCA. Right internal carotid artery angiograms with frontal lateral views showed in proved flow in the distal right MCA branches. Attempted navigation of a 2 mm resolute onyx balloon mounted stent into the right M1/MCA proved unsuccessful due to siphon tortuosity. Then, a 2 x 8 mm apex balloon was navigated to the level of stenosis. Additional angioplasty was performed under fluoroscopy. Right internal carotid angiograms with magnified frontal and lateral views showed additional improvement of the degree of stenosis. The headway duo microcatheter was again navigated over the wire into the right M2/MCA posterior division  branch. Subsequently, a 3 x 24 mm neuroform atlas  stent was deployed in the distal right M1/MCA segment extending to the proximal M2 posterior division branch. Follow-up angiogram showed adequate stent positioning across the stenosis with significant improvement of the degree of stenosis and anterograde flow in the posterior division branch. Persistent slow flow in the anterior division branch noted. Attempted navigation of the headway duo over the 14 wire through the stent proved unsuccessful. Attempted navigation of the apex balloon also proved unsuccessful. The 2 mm apex balloon was then navigated into the proximal right M1/MCA. Angioplasty was performed under fluoroscopy. Follow-up angiograms showed mild improvement of the proximal right M1/MCA stenosis (now approximately 35%). Further attempts to navigate the balloon for the microcatheter through the previously deployed stent proved unsuccessful. The catheter was subsequently withdrawn. Follow-up angiograms with frontal and lateral views showed stable position and opacification of the stent. Delayed opacification of the anterior division branches noted, although improved from baseline. Flat panel CT of the head was obtained and post processed in a separate workstation with concurrent attending physician supervision. Selected images were sent to PACS. No evidence of hemorrhagic complication. IMPRESSION: 1. Severe intracranial atherosclerotic disease with severe hairline stenosis of the distal right M1/MCA segment extending into the origins of the superior and inferior divisions and severe stenosis of the left P2/PCA. Mild (50%) stenosis of the proximal right M1/MCA and at the origin of the anterior temporal branch. 2. Angioplasty and stenting performed for treatment of the area of severe stenosis at the distal right M1/MCA segment with improvement of the anterograde flow. Persistent delayed, although improved, flow to the anterior division branch. No further angioplasty or stenting performed due to lack of  access through the recently deployed stent. 3. Angioplasty performed at the proximal right M1/MCA segment with approximately 35% residual stenosis. PLAN: Continue on dual antiplatelet therapy for 6 month when stent patency should be evaluated. Electronically Signed   By: Pedro Earls M.D.   On: 05/12/2021 15:22   IR CT Head Ltd  Result Date: 05/12/2021 INDICATION: OLETA GUNNOE is a 70 y.o. female with a past medical history significant for anemia, DM, HLD, CAD, HTN and prior CVA who presented to [facility] ED via EMS on 6/13 with complaints of weakness and slurred speech the day prior which had since resolved, per her daughter she had not been taking her medications as prescribed. She was noted to be hypertensive at 171/60 mmHg. Her neurologic exam was benign while in bed, however, patient presented left-sided paresthesia with ambulation. 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 CTA head showed evolving recent infarction of the right corona radiata without acute intracranial hemorrhage as well as marked stenoses of the right MCA origin and bifurcation, moderate left M2 MCA branch stenosis, diffusely irregular left vertebral artery with diminished flow and irregular left P2 PCA with moderate to marked stenosis. She has been transferred to [facility] for planned right MCA angioplasty/stent placement. EXAM: FLUOROSCOPY GUIDED VASCULAR ACCESS DIAGNOSTIC CEREBRAL ANGIOGRAM INTRACRANIAL ANGIOPLASTY AND STENTING, RIGHT MCA FLAT PANEL HEAD CT COMPARISON:  CT/CT angiogram of the head May 09, 2021 MEDICATIONS: Refer to anesthesia documentation. ANESTHESIA/SEDATION: The procedure was performed under general anesthesia CONTRAST:  120 mL of Omnipaque 240 milligram/mL FLUOROSCOPY TIME:  Fluoroscopy Time: 75 minutes 6 seconds (1,935 mGy). COMPLICATIONS: None immediate. TECHNIQUE: Informed written consent was obtained  from the patient after a thorough discussion  of the procedural risks, benefits and alternatives. All questions were addressed. Maximal Sterile Barrier Technique was utilized including caps, mask, sterile gowns, sterile gloves, sterile drape, hand hygiene and skin antiseptic. A timeout was performed prior to the initiation of the procedure. Using the modified Seldinger technique and a micropuncture kit, access was gained to the distal right radial artery at the anatomical snuffbox and a 6 French sheath was placed. Real-time ultrasound guidance was utilized for vascular access including the acquisition of a permanent ultrasound image documenting patency of the accessed vessel. Slow intra arterial infusion of 5,000 IU heparin, 5 mg Verapamil and 240 mcg nitroglicerin diluted in patient's own blood was performed. No significant fluctuation in patient's blood pressure seen. Then, a right radial artery roadmap was obtained via sheath side port. Normal brachial artery branching pattern seen. No significant anatomical variation. The right radial artery caliber is adequate for vascular access. Next, a 5 [place] Simmons 2 glide catheter was navigated over a 0.035" Terumo Glidewire into the right subclavian artery under fluoroscopic guidance. Frontal angiogram of the neck was obtained. The catheter was advanced into the aortic arch. The catheter tip was reformed. The catheter was then advanced into the left subclavian artery. Frontal angiogram of the neck was obtained. The catheter was subsequently placed into the left common carotid artery. Frontal and lateral angiograms of the neck were obtained. Under biplane roadmap, the catheter was then advanced into the left internal carotid artery. Frontal and lateral angiograms of the head were obtained. The catheter was again placed into the right subclavian artery. Frontal and lateral angiograms of the head were obtained. The the catheter was then placed clear into the right common  carotid artery. Frontal and lateral angiograms of the neck were obtained. Under biplane roadmap, the the catheter was then advanced into the right internal carotid artery. Frontal and lateral angiograms of the head were obtained. FINDINGS: 1. Normal course and caliber of the cervical right vertebral artery. 2. Left subclavian artery angiograms showed no evidence of opacification of the left vertebral artery, likely related to occlusion. 3. Mild atherosclerotic changes of the left carotid bifurcation without hemodynamically significant stenosis. 4. Severe stenosis of the P2 segment of the left fetal PCA. 5. Mild stenosis of a left M3/MCA posterior division branch. 6. No significant atherosclerotic changes or stenosis of the right carotid bifurcation. 7. Atherosclerotic changes of the right carotid siphons with mild stenosis at the paraclinoid segment. 8. Approximately 50% stenosis at the origin of the right M1/MCA segment. 9. Approximately 50% stenosis at the origin of a right inferior temporal branch. 10. Severe, hairline stenosis of the distal right M1/MCA segment, involving the origin of both anterior and posterior division branches. There is significant delay in opacification of the anterior division branches. PROCEDURE: This into catheter was exchanged over the wire and under biplane roadmap for a benchmark guide catheter which was placed in the distal cervical segment of the right ICA. Magnified frontal and lateral views of the head were obtained in the working projections. Then, attempted navigation of a 1.25 x 10 mm sapphire balloon over a 0.014 inch Aristotle microguidewire proved unsuccessful due to siphon tortuosity. A headway duo microcatheter was navigated over the Aristotle with a wire into the right M2/MCA posterior division branch, distal to the stenosis. The wire was exchanged for a zoom 14 exchange micro guidewire. The microcatheter was then exchanged for the 1.25 x 10 mm sapphire balloon.  Angioplasty was performed under fluoroscopy at the distal right M1/MCA. Right internal  carotid artery angiograms with frontal lateral views showed in proved flow in the distal right MCA branches. Attempted navigation of a 2 mm resolute onyx balloon mounted stent into the right M1/MCA proved unsuccessful due to siphon tortuosity. Then, a 2 x 8 mm apex balloon was navigated to the level of stenosis. Additional angioplasty was performed under fluoroscopy. Right internal carotid angiograms with magnified frontal and lateral views showed additional improvement of the degree of stenosis. The headway duo microcatheter was again navigated over the wire into the right M2/MCA posterior division branch. Subsequently, a 3 x 24 mm neuroform atlas stent was deployed in the distal right M1/MCA segment extending to the proximal M2 posterior division branch. Follow-up angiogram showed adequate stent positioning across the stenosis with significant improvement of the degree of stenosis and anterograde flow in the posterior division branch. Persistent slow flow in the anterior division branch noted. Attempted navigation of the headway duo over the 14 wire through the stent proved unsuccessful. Attempted navigation of the apex balloon also proved unsuccessful. The 2 mm apex balloon was then navigated into the proximal right M1/MCA. Angioplasty was performed under fluoroscopy. Follow-up angiograms showed mild improvement of the proximal right M1/MCA stenosis (now approximately 35%). Further attempts to navigate the balloon for the microcatheter through the previously deployed stent proved unsuccessful. The catheter was subsequently withdrawn. Follow-up angiograms with frontal and lateral views showed stable position and opacification of the stent. Delayed opacification of the anterior division branches noted, although improved from baseline. Flat panel CT of the head was obtained and post processed in a separate workstation with  concurrent attending physician supervision. Selected images were sent to PACS. No evidence of hemorrhagic complication. IMPRESSION: 1. Severe intracranial atherosclerotic disease with severe hairline stenosis of the distal right M1/MCA segment extending into the origins of the superior and inferior divisions and severe stenosis of the left P2/PCA. Mild (50%) stenosis of the proximal right M1/MCA and at the origin of the anterior temporal branch. 2. Angioplasty and stenting performed for treatment of the area of severe stenosis at the distal right M1/MCA segment with improvement of the anterograde flow. Persistent delayed, although improved, flow to the anterior division branch. No further angioplasty or stenting performed due to lack of access through the recently deployed stent. 3. Angioplasty performed at the proximal right M1/MCA segment with approximately 35% residual stenosis. PLAN: Continue on dual antiplatelet therapy for 6 month when stent patency should be evaluated. Electronically Signed   By: Pedro Earls M.D.   On: 05/12/2021 15:22   IR US Guide Vasc Access Right  Result Date: 05/12/2021 INDICATION: MAELYN BERREY is a 70 y.o. female with a past medical history significant for anemia, DM, HLD, CAD, HTN and prior CVA who presented to [facility] ED via EMS on 6/13 with complaints of weakness and slurred speech the day prior which had since resolved, per her daughter she had not been taking her medications as prescribed. She was noted to be hypertensive at 171/60 mmHg. Her neurologic exam was benign while in bed, however, patient presented left-sided paresthesia with ambulation. 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 CTA head showed evolving recent infarction of the right corona radiata without acute intracranial hemorrhage as well as marked stenoses of the right MCA origin and  bifurcation, moderate left M2 MCA branch stenosis, diffusely irregular left vertebral artery with diminished flow and irregular left P2  PCA with moderate to marked stenosis. She has been transferred to [facility] for planned right MCA angioplasty/stent placement. EXAM: FLUOROSCOPY GUIDED VASCULAR ACCESS DIAGNOSTIC CEREBRAL ANGIOGRAM INTRACRANIAL ANGIOPLASTY AND STENTING, RIGHT MCA FLAT PANEL HEAD CT COMPARISON:  CT/CT angiogram of the head May 09, 2021 MEDICATIONS: Refer to anesthesia documentation. ANESTHESIA/SEDATION: The procedure was performed under general anesthesia CONTRAST:  120 mL of Omnipaque 240 milligram/mL FLUOROSCOPY TIME:  Fluoroscopy Time: 75 minutes 6 seconds (1,935 mGy). COMPLICATIONS: None immediate. TECHNIQUE: Informed written consent was obtained from the patient after a thorough discussion of the procedural risks, benefits and alternatives. All questions were addressed. Maximal Sterile Barrier Technique was utilized including caps, mask, sterile gowns, sterile gloves, sterile drape, hand hygiene and skin antiseptic. A timeout was performed prior to the initiation of the procedure. Using the modified Seldinger technique and a micropuncture kit, access was gained to the distal right radial artery at the anatomical snuffbox and a 6 French sheath was placed. Real-time ultrasound guidance was utilized for vascular access including the acquisition of a permanent ultrasound image documenting patency of the accessed vessel. Slow intra arterial infusion of 5,000 IU heparin, 5 mg Verapamil and 233 mcg nitroglicerin diluted in patient's own blood was performed. No significant fluctuation in patient's blood pressure seen. Then, a right radial artery roadmap was obtained via sheath side port. Normal brachial artery branching pattern seen. No significant anatomical variation. The right radial artery caliber is adequate for vascular access. Next, a 5 [place] Simmons 2 glide catheter was navigated over a 0.035"  Terumo Glidewire into the right subclavian artery under fluoroscopic guidance. Frontal angiogram of the neck was obtained. The catheter was advanced into the aortic arch. The catheter tip was reformed. The catheter was then advanced into the left subclavian artery. Frontal angiogram of the neck was obtained. The catheter was subsequently placed into the left common carotid artery. Frontal and lateral angiograms of the neck were obtained. Under biplane roadmap, the catheter was then advanced into the left internal carotid artery. Frontal and lateral angiograms of the head were obtained. The catheter was again placed into the right subclavian artery. Frontal and lateral angiograms of the head were obtained. The the catheter was then placed clear into the right common carotid artery. Frontal and lateral angiograms of the neck were obtained. Under biplane roadmap, the the catheter was then advanced into the right internal carotid artery. Frontal and lateral angiograms of the head were obtained. FINDINGS: 1. Normal course and caliber of the cervical right vertebral artery. 2. Left subclavian artery angiograms showed no evidence of opacification of the left vertebral artery, likely related to occlusion. 3. Mild atherosclerotic changes of the left carotid bifurcation without hemodynamically significant stenosis. 4. Severe stenosis of the P2 segment of the left fetal PCA. 5. Mild stenosis of a left M3/MCA posterior division branch. 6. No significant atherosclerotic changes or stenosis of the right carotid bifurcation. 7. Atherosclerotic changes of the right carotid siphons with mild stenosis at the paraclinoid segment. 8. Approximately 50% stenosis at the origin of the right M1/MCA segment. 9. Approximately 50% stenosis at the origin of a right inferior temporal branch. 10. Severe, hairline stenosis of the distal right M1/MCA segment, involving the origin of both anterior and posterior division branches. There is  significant delay in opacification of the anterior division branches. PROCEDURE: This into catheter was exchanged over the wire and under biplane roadmap for a benchmark guide catheter which was placed in the distal cervical segment of the right ICA. Magnified frontal  and lateral views of the head were obtained in the working projections. Then, attempted navigation of a 1.25 x 10 mm sapphire balloon over a 0.014 inch Aristotle microguidewire proved unsuccessful due to siphon tortuosity. A headway duo microcatheter was navigated over the Aristotle with a wire into the right M2/MCA posterior division branch, distal to the stenosis. The wire was exchanged for a zoom 14 exchange micro guidewire. The microcatheter was then exchanged for the 1.25 x 10 mm sapphire balloon. Angioplasty was performed under fluoroscopy at the distal right M1/MCA. Right internal carotid artery angiograms with frontal lateral views showed in proved flow in the distal right MCA branches. Attempted navigation of a 2 mm resolute onyx balloon mounted stent into the right M1/MCA proved unsuccessful due to siphon tortuosity. Then, a 2 x 8 mm apex balloon was navigated to the level of stenosis. Additional angioplasty was performed under fluoroscopy. Right internal carotid angiograms with magnified frontal and lateral views showed additional improvement of the degree of stenosis. The headway duo microcatheter was again navigated over the wire into the right M2/MCA posterior division branch. Subsequently, a 3 x 24 mm neuroform atlas stent was deployed in the distal right M1/MCA segment extending to the proximal M2 posterior division branch. Follow-up angiogram showed adequate stent positioning across the stenosis with significant improvement of the degree of stenosis and anterograde flow in the posterior division branch. Persistent slow flow in the anterior division branch noted. Attempted navigation of the headway duo over the 14 wire through the stent  proved unsuccessful. Attempted navigation of the apex balloon also proved unsuccessful. The 2 mm apex balloon was then navigated into the proximal right M1/MCA. Angioplasty was performed under fluoroscopy. Follow-up angiograms showed mild improvement of the proximal right M1/MCA stenosis (now approximately 35%). Further attempts to navigate the balloon for the microcatheter through the previously deployed stent proved unsuccessful. The catheter was subsequently withdrawn. Follow-up angiograms with frontal and lateral views showed stable position and opacification of the stent. Delayed opacification of the anterior division branches noted, although improved from baseline. Flat panel CT of the head was obtained and post processed in a separate workstation with concurrent attending physician supervision. Selected images were sent to PACS. No evidence of hemorrhagic complication. IMPRESSION: 1. Severe intracranial atherosclerotic disease with severe hairline stenosis of the distal right M1/MCA segment extending into the origins of the superior and inferior divisions and severe stenosis of the left P2/PCA. Mild (50%) stenosis of the proximal right M1/MCA and at the origin of the anterior temporal branch. 2. Angioplasty and stenting performed for treatment of the area of severe stenosis at the distal right M1/MCA segment with improvement of the anterograde flow. Persistent delayed, although improved, flow to the anterior division branch. No further angioplasty or stenting performed due to lack of access through the recently deployed stent. 3. Angioplasty performed at the proximal right M1/MCA segment with approximately 35% residual stenosis. PLAN: Continue on dual antiplatelet therapy for 6 month when stent patency should be evaluated. Electronically Signed   By: Pedro Earls M.D.   On: 05/12/2021 15:22   DG Knee Complete 4 Views Left  Result Date: 05/08/2021 CLINICAL DATA:  Recent fall with left knee  pain, initial encounter EXAM: LEFT KNEE - COMPLETE 4+ VIEW COMPARISON:  None. FINDINGS: No evidence of fracture, dislocation, or joint effusion. No evidence of arthropathy or other focal bone abnormality. Soft tissues are unremarkable. IMPRESSION: No acute abnormality noted. Electronically Signed   By: Linus Mako.D.  On: 05/08/2021 15:32   ECHOCARDIOGRAM COMPLETE  Result Date: 05/09/2021    ECHOCARDIOGRAM REPORT   Patient Name:   MANON BANBURY Dooley Date of Exam: 05/09/2021 Medical Rec #:  [id number]        Height:       62.0 in Accession #:    [id number]       Weight:       175.5 lb Date of Birth:  [birth year]       BSA:          1.808 m Patient Age:    70 years         BP:           179/73 mmHg Patient Gender: F                HR:           73 bpm. Exam Location:  [facility] Procedure: 2D Echo, Cardiac Doppler, Color Doppler and Saline Contrast Bubble            Study Indications:     Stroke I63.9  History:         Patient has no prior history of Echocardiogram examinations.                  Stroke; Risk Factors:Diabetes.  Sonographer:     Sherrie Sport RDCS (AE) Referring Phys:  [place] Diagnosing Phys: Ida Rogue MD IMPRESSIONS  1. Left ventricular ejection fraction, by estimation, is 60 to 65%. The left ventricle has normal function. The left ventricle has no regional wall motion abnormalities. Left ventricular diastolic parameters are indeterminate.  2. Right ventricular systolic function is normal. The right ventricular size is normal. FINDINGS  Left Ventricle: Left ventricular ejection fraction, by estimation, is 60 to 65%. The left ventricle has normal function. The left ventricle has no regional wall motion abnormalities. The left ventricular internal cavity size was normal in size. There is  no left ventricular hypertrophy. Left ventricular diastolic parameters are indeterminate. Right Ventricle: The right ventricular size is normal. No increase in right ventricular wall thickness. Right  ventricular systolic function is normal. Left Atrium: Left atrial size was normal in size. Right Atrium: Right atrial size was normal in size. Pericardium: There is no evidence of pericardial effusion. Mitral Valve: The mitral valve was not well visualized. No evidence of mitral valve regurgitation. No evidence of mitral valve stenosis. Tricuspid Valve: The tricuspid valve is not well visualized. Tricuspid valve regurgitation is not demonstrated. No evidence of tricuspid stenosis. Aortic Valve: The aortic valve was not well visualized. Aortic valve regurgitation is not visualized. No aortic stenosis is present. Pulmonic Valve: The pulmonic valve was not well visualized. Pulmonic valve regurgitation is not visualized. No evidence of pulmonic stenosis. Aorta: The aortic root is normal in size and structure. Venous: The inferior vena cava is normal in size with greater than 50% respiratory variability, suggesting right atrial pressure of 3 mmHg. IAS/Shunts: No atrial level shunt detected by color flow Doppler. Agitated saline contrast was given intravenously to evaluate for intracardiac shunting.  LEFT VENTRICLE PLAX 2D LVIDd:         3.70 cm LVIDs:         2.30 cm LV PW:         1.20 cm LV IVS:        1.30 cm LVOT diam:     2.10 cm LVOT Area:     3.46 cm  LEFT ATRIUM  Index LA diam:    3.20 cm 1.77 cm/m                        PULMONIC VALVE AORTA                 PV Vmax:        0.99 m/s Ao Root diam: 3.20 cm PV Peak grad:   3.9 mmHg                       RVOT Peak grad: 5 mmHg   SHUNTS Systemic Diam: 2.10 cm Ida Rogue MD Electronically signed by Ida Rogue MD Signature Date/Time: 05/09/2021/5:06:01 PM    Final    IR ANGIO INTRA EXTRACRAN SEL COM CAROTID INNOMINATE UNI L MOD SED  Result Date: 05/12/2021 INDICATION: SHANDRA SZYMBORSKI is a 70 y.o. female with a past medical history significant for anemia, DM, HLD, CAD, HTN and prior CVA who presented to [facility] ED via EMS on 6/13 with complaints of  weakness and slurred speech the day prior which had since resolved, per her daughter she had not been taking her medications as prescribed. She was noted to be hypertensive at 171/60 mmHg. Her neurologic exam was benign while in bed, however, patient presented left-sided paresthesia with ambulation. 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 CTA head showed evolving recent infarction of the right corona radiata without acute intracranial hemorrhage as well as marked stenoses of the right MCA origin and bifurcation, moderate left M2 MCA branch stenosis, diffusely irregular left vertebral artery with diminished flow and irregular left P2 PCA with moderate to marked stenosis. She has been transferred to [facility] for planned right MCA angioplasty/stent placement. EXAM: FLUOROSCOPY GUIDED VASCULAR ACCESS DIAGNOSTIC CEREBRAL ANGIOGRAM INTRACRANIAL ANGIOPLASTY AND STENTING, RIGHT MCA FLAT PANEL HEAD CT COMPARISON:  CT/CT angiogram of the head May 09, 2021 MEDICATIONS: Refer to anesthesia documentation. ANESTHESIA/SEDATION: The procedure was performed under general anesthesia CONTRAST:  120 mL of Omnipaque 240 milligram/mL FLUOROSCOPY TIME:  Fluoroscopy Time: 75 minutes 6 seconds (1,935 mGy). COMPLICATIONS: None immediate. TECHNIQUE: Informed written consent was obtained from the patient after a thorough discussion of the procedural risks, benefits and alternatives. All questions were addressed. Maximal Sterile Barrier Technique was utilized including caps, mask, sterile gowns, sterile gloves, sterile drape, hand hygiene and skin antiseptic. A timeout was performed prior to the initiation of the procedure. Using the modified Seldinger technique and a micropuncture kit, access was gained to the distal right radial artery at the anatomical snuffbox and a 6 French sheath was placed. Real-time ultrasound guidance was utilized for  vascular access including the acquisition of a permanent ultrasound image documenting patency of the accessed vessel. Slow intra arterial infusion of 5,000 IU heparin, 5 mg Verapamil and 338 mcg nitroglicerin diluted in patient's own blood was performed. No significant fluctuation in patient's blood pressure seen. Then, a right radial artery roadmap was obtained via sheath side port. Normal brachial artery branching pattern seen. No significant anatomical variation. The right radial artery caliber is adequate for vascular access. Next, a 5 [place] Simmons 2 glide catheter was navigated over a 0.035" Terumo Glidewire into the right subclavian artery under fluoroscopic guidance. Frontal angiogram of the neck was obtained. The catheter was advanced into the aortic arch. The catheter tip was reformed. The catheter was then advanced into the left subclavian  artery. Frontal angiogram of the neck was obtained. The catheter was subsequently placed into the left common carotid artery. Frontal and lateral angiograms of the neck were obtained. Under biplane roadmap, the catheter was then advanced into the left internal carotid artery. Frontal and lateral angiograms of the head were obtained. The catheter was again placed into the right subclavian artery. Frontal and lateral angiograms of the head were obtained. The the catheter was then placed clear into the right common carotid artery. Frontal and lateral angiograms of the neck were obtained. Under biplane roadmap, the the catheter was then advanced into the right internal carotid artery. Frontal and lateral angiograms of the head were obtained. FINDINGS: 1. Normal course and caliber of the cervical right vertebral artery. 2. Left subclavian artery angiograms showed no evidence of opacification of the left vertebral artery, likely related to occlusion. 3. Mild atherosclerotic changes of the left carotid bifurcation without hemodynamically significant stenosis. 4. Severe  stenosis of the P2 segment of the left fetal PCA. 5. Mild stenosis of a left M3/MCA posterior division branch. 6. No significant atherosclerotic changes or stenosis of the right carotid bifurcation. 7. Atherosclerotic changes of the right carotid siphons with mild stenosis at the paraclinoid segment. 8. Approximately 50% stenosis at the origin of the right M1/MCA segment. 9. Approximately 50% stenosis at the origin of a right inferior temporal branch. 10. Severe, hairline stenosis of the distal right M1/MCA segment, involving the origin of both anterior and posterior division branches. There is significant delay in opacification of the anterior division branches. PROCEDURE: This into catheter was exchanged over the wire and under biplane roadmap for a benchmark guide catheter which was placed in the distal cervical segment of the right ICA. Magnified frontal and lateral views of the head were obtained in the working projections. Then, attempted navigation of a 1.25 x 10 mm sapphire balloon over a 0.014 inch Aristotle microguidewire proved unsuccessful due to siphon tortuosity. A headway duo microcatheter was navigated over the Aristotle with a wire into the right M2/MCA posterior division branch, distal to the stenosis. The wire was exchanged for a zoom 14 exchange micro guidewire. The microcatheter was then exchanged for the 1.25 x 10 mm sapphire balloon. Angioplasty was performed under fluoroscopy at the distal right M1/MCA. Right internal carotid artery angiograms with frontal lateral views showed in proved flow in the distal right MCA branches. Attempted navigation of a 2 mm resolute onyx balloon mounted stent into the right M1/MCA proved unsuccessful due to siphon tortuosity. Then, a 2 x 8 mm apex balloon was navigated to the level of stenosis. Additional angioplasty was performed under fluoroscopy. Right internal carotid angiograms with magnified frontal and lateral views showed additional improvement of the  degree of stenosis. The headway duo microcatheter was again navigated over the wire into the right M2/MCA posterior division branch. Subsequently, a 3 x 24 mm neuroform atlas stent was deployed in the distal right M1/MCA segment extending to the proximal M2 posterior division branch. Follow-up angiogram showed adequate stent positioning across the stenosis with significant improvement of the degree of stenosis and anterograde flow in the posterior division branch. Persistent slow flow in the anterior division branch noted. Attempted navigation of the headway duo over the 14 wire through the stent proved unsuccessful. Attempted navigation of the apex balloon also proved unsuccessful. The 2 mm apex balloon was then navigated into the proximal right M1/MCA. Angioplasty was performed under fluoroscopy. Follow-up angiograms showed mild improvement of the proximal right M1/MCA stenosis (now  approximately 35%). Further attempts to navigate the balloon for the microcatheter through the previously deployed stent proved unsuccessful. The catheter was subsequently withdrawn. Follow-up angiograms with frontal and lateral views showed stable position and opacification of the stent. Delayed opacification of the anterior division branches noted, although improved from baseline. Flat panel CT of the head was obtained and post processed in a separate workstation with concurrent attending physician supervision. Selected images were sent to PACS. No evidence of hemorrhagic complication. IMPRESSION: 1. Severe intracranial atherosclerotic disease with severe hairline stenosis of the distal right M1/MCA segment extending into the origins of the superior and inferior divisions and severe stenosis of the left P2/PCA. Mild (50%) stenosis of the proximal right M1/MCA and at the origin of the anterior temporal branch. 2. Angioplasty and stenting performed for treatment of the area of severe stenosis at the distal right M1/MCA segment with  improvement of the anterograde flow. Persistent delayed, although improved, flow to the anterior division branch. No further angioplasty or stenting performed due to lack of access through the recently deployed stent. 3. Angioplasty performed at the proximal right M1/MCA segment with approximately 35% residual stenosis. PLAN: Continue on dual antiplatelet therapy for 6 month when stent patency should be evaluated. Electronically Signed   By: Pedro Earls M.D.   On: 05/12/2021 15:22   IR ANGIO VERTEBRAL SEL VERTEBRAL UNI R MOD SED  Result Date: 05/12/2021 INDICATION: MAGDALENE TARDIFF is a 70 y.o. female with a past medical history significant for anemia, DM, HLD, CAD, HTN and prior CVA who presented to [facility] ED via EMS on 6/13 with complaints of weakness and slurred speech the day prior which had since resolved, per her daughter she had not been taking her medications as prescribed. She was noted to be hypertensive at 171/60 mmHg. Her neurologic exam was benign while in bed, however, patient presented left-sided paresthesia with ambulation. 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 CTA head showed evolving recent infarction of the right corona radiata without acute intracranial hemorrhage as well as marked stenoses of the right MCA origin and bifurcation, moderate left M2 MCA branch stenosis, diffusely irregular left vertebral artery with diminished flow and irregular left P2 PCA with moderate to marked stenosis. She has been transferred to [facility] for planned right MCA angioplasty/stent placement. EXAM: FLUOROSCOPY GUIDED VASCULAR ACCESS DIAGNOSTIC CEREBRAL ANGIOGRAM INTRACRANIAL ANGIOPLASTY AND STENTING, RIGHT MCA FLAT PANEL HEAD CT COMPARISON:  CT/CT angiogram of the head May 09, 2021 MEDICATIONS: Refer to anesthesia documentation. ANESTHESIA/SEDATION: The procedure was performed under general  anesthesia CONTRAST:  120 mL of Omnipaque 240 milligram/mL FLUOROSCOPY TIME:  Fluoroscopy Time: 75 minutes 6 seconds (1,935 mGy). COMPLICATIONS: None immediate. TECHNIQUE: Informed written consent was obtained from the patient after a thorough discussion of the procedural risks, benefits and alternatives. All questions were addressed. Maximal Sterile Barrier Technique was utilized including caps, mask, sterile gowns, sterile gloves, sterile drape, hand hygiene and skin antiseptic. A timeout was performed prior to the initiation of the procedure. Using the modified Seldinger technique and a micropuncture kit, access was gained to the distal right radial artery at the anatomical snuffbox and a 6 French sheath was placed. Real-time ultrasound guidance was utilized for vascular access including the acquisition of a permanent ultrasound image documenting patency of the accessed vessel. Slow intra arterial infusion of 5,000 IU heparin, 5 mg Verapamil and 371 mcg nitroglicerin diluted in  patient's own blood was performed. No significant fluctuation in patient's blood pressure seen. Then, a right radial artery roadmap was obtained via sheath side port. Normal brachial artery branching pattern seen. No significant anatomical variation. The right radial artery caliber is adequate for vascular access. Next, a 5 [place] Simmons 2 glide catheter was navigated over a 0.035" Terumo Glidewire into the right subclavian artery under fluoroscopic guidance. Frontal angiogram of the neck was obtained. The catheter was advanced into the aortic arch. The catheter tip was reformed. The catheter was then advanced into the left subclavian artery. Frontal angiogram of the neck was obtained. The catheter was subsequently placed into the left common carotid artery. Frontal and lateral angiograms of the neck were obtained. Under biplane roadmap, the catheter was then advanced into the left internal carotid artery. Frontal and lateral angiograms  of the head were obtained. The catheter was again placed into the right subclavian artery. Frontal and lateral angiograms of the head were obtained. The the catheter was then placed clear into the right common carotid artery. Frontal and lateral angiograms of the neck were obtained. Under biplane roadmap, the the catheter was then advanced into the right internal carotid artery. Frontal and lateral angiograms of the head were obtained. FINDINGS: 1. Normal course and caliber of the cervical right vertebral artery. 2. Left subclavian artery angiograms showed no evidence of opacification of the left vertebral artery, likely related to occlusion. 3. Mild atherosclerotic changes of the left carotid bifurcation without hemodynamically significant stenosis. 4. Severe stenosis of the P2 segment of the left fetal PCA. 5. Mild stenosis of a left M3/MCA posterior division branch. 6. No significant atherosclerotic changes or stenosis of the right carotid bifurcation. 7. Atherosclerotic changes of the right carotid siphons with mild stenosis at the paraclinoid segment. 8. Approximately 50% stenosis at the origin of the right M1/MCA segment. 9. Approximately 50% stenosis at the origin of a right inferior temporal branch. 10. Severe, hairline stenosis of the distal right M1/MCA segment, involving the origin of both anterior and posterior division branches. There is significant delay in opacification of the anterior division branches. PROCEDURE: This into catheter was exchanged over the wire and under biplane roadmap for a benchmark guide catheter which was placed in the distal cervical segment of the right ICA. Magnified frontal and lateral views of the head were obtained in the working projections. Then, attempted navigation of a 1.25 x 10 mm sapphire balloon over a 0.014 inch Aristotle microguidewire proved unsuccessful due to siphon tortuosity. A headway duo microcatheter was navigated over the Aristotle with a wire into the  right M2/MCA posterior division branch, distal to the stenosis. The wire was exchanged for a zoom 14 exchange micro guidewire. The microcatheter was then exchanged for the 1.25 x 10 mm sapphire balloon. Angioplasty was performed under fluoroscopy at the distal right M1/MCA. Right internal carotid artery angiograms with frontal lateral views showed in proved flow in the distal right MCA branches. Attempted navigation of a 2 mm resolute onyx balloon mounted stent into the right M1/MCA proved unsuccessful due to siphon tortuosity. Then, a 2 x 8 mm apex balloon was navigated to the level of stenosis. Additional angioplasty was performed under fluoroscopy. Right internal carotid angiograms with magnified frontal and lateral views showed additional improvement of the degree of stenosis. The headway duo microcatheter was again navigated over the wire into the right M2/MCA posterior division branch. Subsequently, a 3 x 24 mm neuroform atlas stent was deployed in the distal right M1/MCA  segment extending to the proximal M2 posterior division branch. Follow-up angiogram showed adequate stent positioning across the stenosis with significant improvement of the degree of stenosis and anterograde flow in the posterior division branch. Persistent slow flow in the anterior division branch noted. Attempted navigation of the headway duo over the 14 wire through the stent proved unsuccessful. Attempted navigation of the apex balloon also proved unsuccessful. The 2 mm apex balloon was then navigated into the proximal right M1/MCA. Angioplasty was performed under fluoroscopy. Follow-up angiograms showed mild improvement of the proximal right M1/MCA stenosis (now approximately 35%). Further attempts to navigate the balloon for the microcatheter through the previously deployed stent proved unsuccessful. The catheter was subsequently withdrawn. Follow-up angiograms with frontal and lateral views showed stable position and opacification of  the stent. Delayed opacification of the anterior division branches noted, although improved from baseline. Flat panel CT of the head was obtained and post processed in a separate workstation with concurrent attending physician supervision. Selected images were sent to PACS. No evidence of hemorrhagic complication. IMPRESSION: 1. Severe intracranial atherosclerotic disease with severe hairline stenosis of the distal right M1/MCA segment extending into the origins of the superior and inferior divisions and severe stenosis of the left P2/PCA. Mild (50%) stenosis of the proximal right M1/MCA and at the origin of the anterior temporal branch. 2. Angioplasty and stenting performed for treatment of the area of severe stenosis at the distal right M1/MCA segment with improvement of the anterograde flow. Persistent delayed, although improved, flow to the anterior division branch. No further angioplasty or stenting performed due to lack of access through the recently deployed stent. 3. Angioplasty performed at the proximal right M1/MCA segment with approximately 35% residual stenosis. PLAN: Continue on dual antiplatelet therapy for 6 month when stent patency should be evaluated. Electronically Signed   By: Pedro Earls M.D.   On: 05/12/2021 15:22      Subjective: Tired, no fever, headache, focal weakness, numbness, chest pain or repiratory distress.  Discharge Exam: Vitals:   05/15/21 0804 05/15/21 1150  BP: (!) 156/51 (!) 151/64  Pulse: 61 64  Resp: 18 20  Temp: 97.7 F (36.5 C) 98.3 F (36.8 C)  SpO2: 98% 100%   Vitals:   05/15/21 0144 05/15/21 0444 05/15/21 0804 05/15/21 1150  BP: (!) 147/49 (!) 140/59 (!) 156/51 (!) 151/64  Pulse: 62 66 61 64  Resp: 20 20 18 20   Temp: 98.7 F (37.1 C) 98.7 F (37.1 C) 97.7 F (36.5 C) 98.3 F (36.8 C)  TempSrc: Oral Oral Oral   SpO2: 100% 100% 98% 100%  Weight: 81.6 kg     Height:        General: Pt is alert, awake, not in acute  distress Cardiovascular: RRR, nl S1-S2, no murmurs appreciated.   No LE edema.   Respiratory: Normal respiratory rate and rhythm.  CTAB without rales or wheezes. Abdominal: Abdomen soft and non-tender.  No distension or HSM.   Neuro/Psych: Strength symmetric in upper and lower extremities.  Judgment and insight appear impaired, very slow to speak.   The results of significant diagnostics from this hospitalization (including imaging, microbiology, ancillary and laboratory) are listed below for reference.     Microbiology: Recent Results (from the past 240 hour(s))  Resp Panel by RT-PCR (Flu A&B, Covid) Nasopharyngeal Swab     Status: None   Collection Time: 05/08/21  2:19 PM   Specimen: Nasopharyngeal Swab; Nasopharyngeal(NP) swabs in vial transport medium  Result Value Ref  Range Status   SARS Coronavirus 2 by RT PCR NEGATIVE NEGATIVE Final    Comment: (NOTE) SARS-CoV-2 target nucleic acids are NOT DETECTED.  The SARS-CoV-2 RNA is generally detectable in upper respiratory specimens during the acute phase of infection. The lowest concentration of SARS-CoV-2 viral copies this assay can detect is 138 copies/mL. A negative result does not preclude SARS-Cov-2 infection and should not be used as the sole basis for treatment or other patient management decisions. A negative result may occur with  improper specimen collection/handling, submission of specimen other than nasopharyngeal swab, presence of viral mutation(s) within the areas targeted by this assay, and inadequate number of viral copies(<138 copies/mL). A negative result must be combined with clinical observations, patient history, and epidemiological information. The expected result is Negative.  Fact Sheet for Patients:  EntrepreneurPulse.com.au  Fact Sheet for Healthcare Providers:  IncredibleEmployment.be  This test is no t yet approved or cleared by the [place] FDA and  has been  authorized for detection and/or diagnosis of SARS-CoV-2 by FDA under an Emergency Use Authorization (EUA). This EUA will remain  in effect (meaning this test can be used) for the duration of the COVID-19 declaration under Section 564(b)(1) of the Act, 21 U.S.C.section 360bbb-3(b)(1), unless the authorization is terminated  or revoked sooner.       Influenza A by PCR NEGATIVE NEGATIVE Final   Influenza B by PCR NEGATIVE NEGATIVE Final    Comment: (NOTE) The Xpert Xpress SARS-CoV-2/FLU/RSV plus assay is intended as an aid in the diagnosis of influenza from Nasopharyngeal swab specimens and should not be used as a sole basis for treatment. Nasal washings and aspirates are unacceptable for Xpert Xpress SARS-CoV-2/FLU/RSV testing.  Fact Sheet for Patients: EntrepreneurPulse.com.au  Fact Sheet for Healthcare Providers: IncredibleEmployment.be  This test is not yet approved or cleared by the [place] FDA and has been authorized for detection and/or diagnosis of SARS-CoV-2 by FDA under an Emergency Use Authorization (EUA). This EUA will remain in effect (meaning this test can be used) for the duration of the COVID-19 declaration under Section 564(b)(1) of the Act, 21 U.S.C. section 360bbb-3(b)(1), unless the authorization is terminated or revoked.  Performed at [facility], [address] [place], [place] [address]   MRSA PCR Screening     Status: None   Collection Time: 05/13/21 10:31 AM  Result Value Ref Range Status   MRSA by PCR NEGATIVE NEGATIVE Final    Comment:        The GeneXpert MRSA Assay (FDA approved for NASAL specimens only), is one component of a comprehensive MRSA colonization surveillance program. It is not intended to diagnose MRSA infection nor to guide or monitor treatment for MRSA infections. Performed at [facility], [place] [address] [place], [place] [address]      Labs: BNP (last 3  results) No results for input(s): BNP in the last 8760 hours. Basic Metabolic Panel: Recent Labs  Lab 05/09/21 1306 05/11/21 0206 05/12/21 0445 05/13/21 0350 05/14/21 0917 05/15/21 0246  NA 135 137 136 137 135 135  K 4.0 3.9 4.1 3.9 4.1 4.3  CL 103 103 102 104 102 102  CO2 25 23 24 25 24 23   GLUCOSE 262* 157* 144* 183* 191* 203*  BUN 18 14 14 14 16 22   CREATININE 0.75 0.82 0.84 0.78 0.75 0.92  CALCIUM 8.9 9.0 8.8* 8.6* 8.8* 8.8*  MG 2.0 2.2  --   --   --   --   PHOS  3.0 4.3  --   --   --   --    Liver Function Tests: Recent Labs  Lab 05/09/21 1306  ALBUMIN 3.6   No results for input(s): LIPASE, AMYLASE in the last 168 hours. No results for input(s): AMMONIA in the last 168 hours. CBC: Recent Labs  Lab 05/09/21 1306 05/11/21 0206 05/12/21 0445 05/13/21 0350  WBC 8.2 9.4 8.8 9.4  HGB 12.2 12.0 10.1* 10.0*  HCT 37.6 38.3 31.9* 31.7*  MCV 78.8* 80.3 80.4 81.1  PLT 320 340 305 303   Cardiac Enzymes: No results for input(s): CKTOTAL, CKMB, CKMBINDEX, TROPONINI in the last 168 hours. BNP: Invalid input(s): POCBNP CBG: Recent Labs  Lab 05/14/21 1608 05/14/21 2113 05/15/21 0804 05/15/21 1222 05/15/21 1640  GLUCAP 251* 155* 179* 161* 200*   D-Dimer No results for input(s): DDIMER in the last 72 hours. Hgb A1c No results for input(s): HGBA1C in the last 72 hours. Lipid Profile No results for input(s): CHOL, HDL, LDLCALC, TRIG, CHOLHDL, LDLDIRECT in the last 72 hours. Thyroid function studies No results for input(s): TSH, T4TOTAL, T3FREE, THYROIDAB in the last 72 hours.  Invalid input(s): FREET3 Anemia work up No results for input(s): VITAMINB12, FOLATE, FERRITIN, TIBC, IRON, RETICCTPCT in the last 72 hours. Urinalysis    Component Value Date/Time   COLORURINE YELLOW (A) 05/08/2021 1515   APPEARANCEUR HAZY (A) 05/08/2021 1515   LABSPEC 1.025 05/08/2021 1515   PHURINE 5.0 05/08/2021 1515   GLUCOSEU 50 (A) 05/08/2021 1515   HGBUR NEGATIVE 05/08/2021 1515    BILIRUBINUR NEGATIVE 05/08/2021 1515   KETONESUR 5 (A) 05/08/2021 1515   PROTEINUR NEGATIVE 05/08/2021 1515   NITRITE NEGATIVE 05/08/2021 1515   LEUKOCYTESUR TRACE (A) 05/08/2021 1515   Sepsis Labs Invalid input(s): PROCALCITONIN,  WBC,  LACTICIDVEN Microbiology Recent Results (from the past 240 hour(s))  Resp Panel by RT-PCR (Flu A&B, Covid) Nasopharyngeal Swab     Status: None   Collection Time: 05/08/21  2:19 PM   Specimen: Nasopharyngeal Swab; Nasopharyngeal(NP) swabs in vial transport medium  Result Value Ref Range Status   SARS Coronavirus 2 by RT PCR NEGATIVE NEGATIVE Final    Comment: (NOTE) SARS-CoV-2 target nucleic acids are NOT DETECTED.  The SARS-CoV-2 RNA is generally detectable in upper respiratory specimens during the acute phase of infection. The lowest concentration of SARS-CoV-2 viral copies this assay can detect is 138 copies/mL. A negative result does not preclude SARS-Cov-2 infection and should not be used as the sole basis for treatment or other patient management decisions. A negative result may occur with  improper specimen collection/handling, submission of specimen other than nasopharyngeal swab, presence of viral mutation(s) within the areas targeted by this assay, and inadequate number of viral copies(<138 copies/mL). A negative result must be combined with clinical observations, patient history, and epidemiological information. The expected result is Negative.  Fact Sheet for Patients:  EntrepreneurPulse.com.au  Fact Sheet for Healthcare Providers:  IncredibleEmployment.be  This test is no t yet approved or cleared by the [place] FDA and  has been authorized for detection and/or diagnosis of SARS-CoV-2 by FDA under an Emergency Use Authorization (EUA). This EUA will remain  in effect (meaning this test can be used) for the duration of the COVID-19 declaration under Section 564(b)(1) of the Act,  21 U.S.C.section 360bbb-3(b)(1), unless the authorization is terminated  or revoked sooner.       Influenza A by PCR NEGATIVE NEGATIVE Final   Influenza B by PCR NEGATIVE NEGATIVE Final  Comment: (NOTE) The Xpert Xpress SARS-CoV-2/FLU/RSV plus assay is intended as an aid in the diagnosis of influenza from Nasopharyngeal swab specimens and should not be used as a sole basis for treatment. Nasal washings and aspirates are unacceptable for Xpert Xpress SARS-CoV-2/FLU/RSV testing.  Fact Sheet for Patients: EntrepreneurPulse.com.au  Fact Sheet for Healthcare Providers: IncredibleEmployment.be  This test is not yet approved or cleared by the [place] FDA and has been authorized for detection and/or diagnosis of SARS-CoV-2 by FDA under an Emergency Use Authorization (EUA). This EUA will remain in effect (meaning this test can be used) for the duration of the COVID-19 declaration under Section 564(b)(1) of the Act, 21 U.S.C. section 360bbb-3(b)(1), unless the authorization is terminated or revoked.  Performed at [facility], [address] [place], [place] [address]   MRSA PCR Screening     Status: None   Collection Time: 05/13/21 10:31 AM  Result Value Ref Range Status   MRSA by PCR NEGATIVE NEGATIVE Final    Comment:        The GeneXpert MRSA Assay (FDA approved for NASAL specimens only), is one component of a comprehensive MRSA colonization surveillance program. It is not intended to diagnose MRSA infection nor to guide or monitor treatment for MRSA infections. Performed at [facility], [place] [address], [place], [place] [address]      Time coordinating discharge: 25 minutes     30 Day Unplanned Readmission Risk Score    Flowsheet Row Admission (Current) from 05/11/2021 in [facility]  30 Day Unplanned Readmission Risk Score (%) 11.11 Filed at 05/15/2021 1200       This score is  the patient's risk of an unplanned readmission within 30 days of being discharged (0 -100%). The score is based on dignosis, age, lab data, medications, orders, and past utilization.   Low:  0-14.9   Medium: 15-21.9   High: 22-29.9   Extreme: 30 and above             SIGNED:   Edwin Dada, MD  [facility] 05/15/2021, 8:26 PM

## 2021-05-16 ENCOUNTER — Encounter (HOSPITAL_COMMUNITY): Payer: Self-pay

## 2021-05-16 DIAGNOSIS — I639 Cerebral infarction, unspecified: Secondary | ICD-10-CM | POA: Diagnosis not present

## 2021-05-16 LAB — URINALYSIS, ROUTINE W REFLEX MICROSCOPIC
Bilirubin Urine: NEGATIVE
Glucose, UA: NEGATIVE mg/dL
Hgb urine dipstick: NEGATIVE
Ketones, ur: NEGATIVE mg/dL
Leukocytes,Ua: NEGATIVE
Nitrite: POSITIVE — AB
Protein, ur: NEGATIVE mg/dL
Specific Gravity, Urine: 1.02 (ref 1.005–1.030)
pH: 5 (ref 5.0–8.0)

## 2021-05-16 LAB — CBC WITH DIFFERENTIAL/PLATELET
Abs Immature Granulocytes: 0.05 10*3/uL (ref 0.00–0.07)
Basophils Absolute: 0.1 10*3/uL (ref 0.0–0.1)
Basophils Relative: 1 %
Eosinophils Absolute: 0.2 10*3/uL (ref 0.0–0.5)
Eosinophils Relative: 2 %
HCT: 32.4 % — ABNORMAL LOW (ref 36.0–46.0)
Hemoglobin: 10.5 g/dL — ABNORMAL LOW (ref 12.0–15.0)
Immature Granulocytes: 1 %
Lymphocytes Relative: 18 %
Lymphs Abs: 2 10*3/uL (ref 0.7–4.0)
MCH: 25.9 pg — ABNORMAL LOW (ref 26.0–34.0)
MCHC: 32.4 g/dL (ref 30.0–36.0)
MCV: 79.8 fL — ABNORMAL LOW (ref 80.0–100.0)
Monocytes Absolute: 0.9 10*3/uL (ref 0.1–1.0)
Monocytes Relative: 8 %
Neutro Abs: 7.9 10*3/uL — ABNORMAL HIGH (ref 1.7–7.7)
Neutrophils Relative %: 70 %
Platelets: 332 10*3/uL (ref 150–400)
RBC: 4.06 MIL/uL (ref 3.87–5.11)
RDW: 13.8 % (ref 11.5–15.5)
WBC: 11 10*3/uL — ABNORMAL HIGH (ref 4.0–10.5)
nRBC: 0 % (ref 0.0–0.2)

## 2021-05-16 LAB — GLUCOSE, CAPILLARY
Glucose-Capillary: 154 mg/dL — ABNORMAL HIGH (ref 70–99)
Glucose-Capillary: 168 mg/dL — ABNORMAL HIGH (ref 70–99)
Glucose-Capillary: 140 mg/dL — ABNORMAL HIGH (ref 70–99)
Glucose-Capillary: 112 mg/dL — ABNORMAL HIGH (ref 70–99)

## 2021-05-16 LAB — COMPREHENSIVE METABOLIC PANEL
ALT: 28 U/L (ref 0–44)
AST: 18 U/L (ref 15–41)
Albumin: 2.8 g/dL — ABNORMAL LOW (ref 3.5–5.0)
Alkaline Phosphatase: 91 U/L (ref 38–126)
Anion gap: 13 (ref 5–15)
BUN: 20 mg/dL (ref 8–23)
CO2: 23 mmol/L (ref 22–32)
Calcium: 8.8 mg/dL — ABNORMAL LOW (ref 8.9–10.3)
Chloride: 104 mmol/L (ref 98–111)
Creatinine, Ser: 0.72 mg/dL (ref 0.44–1.00)
GFR, Estimated: >60 mL/min (ref >60)
Glucose, Bld: 137 mg/dL — ABNORMAL HIGH (ref 70–99)
Potassium: 4.1 mmol/L (ref 3.5–5.1)
Sodium: 140 mmol/L (ref 135–145)
Total Bilirubin: 0.7 mg/dL (ref 0.3–1.2)
Total Protein: 6 g/dL — ABNORMAL LOW (ref 6.5–8.1)

## 2021-05-16 LAB — VITAMIN D 25 HYDROXY (VIT D DEFICIENCY, FRACTURES): Vit D, 25-Hydroxy: 29.28 ng/mL — ABNORMAL LOW (ref 30–100)

## 2021-05-16 LAB — MAGNESIUM: Magnesium: 2.1 mg/dL (ref 1.7–2.4)

## 2021-05-16 MED ORDER — VITAMIN D (ERGOCALCIFEROL) 1.25 MG (50000 UNIT) PO CAPS
50000.0000 [IU] | ORAL_CAPSULE | ORAL | Status: DC
  Administered 2021-05-16 – 2021-05-23 (×2): 50000 [IU] via ORAL
  Filled 2021-05-16 (×2): qty 1

## 2021-05-16 MED ORDER — NITROFURANTOIN MONOHYD MACRO 100 MG PO CAPS
100.0000 mg | ORAL_CAPSULE | Freq: Two times a day (BID) | ORAL | Status: DC
  Administered 2021-05-16 – 2021-05-19 (×6): 100 mg via ORAL
  Filled 2021-05-16 (×7): qty 1

## 2021-05-16 NOTE — Progress Notes (Signed)
Inpatient Rehabilitation Care Coordinator Assessment and Plan Patient Details  Name: Madison Davidson MRN: [id number] Date of Birth: [birth year]  Today's Date: 05/16/2021  Hospital Problems: Principal Problem:   Large vessel stroke [facility])  Past Medical History:  Past Medical History:  Diagnosis Date   Cancer ([place])    skin   Coronary artery disease    Diabetes mellitus without complication ([place])    Hypertension    Stroke [facility])    Past Surgical History:  Past Surgical History:  Procedure Laterality Date   ABDOMINAL HYSTERECTOMY     IR ANGIO INTRA EXTRACRAN SEL INTERNAL CAROTID BILAT MOD SED  05/11/2021   IR ANGIO VERTEBRAL SEL VERTEBRAL UNI R MOD SED  05/11/2021   IR ANGIOGRAM EXTREMITY LEFT  05/11/2021   IR CT HEAD LTD  05/11/2021   IR INTRA CRAN STENT  05/11/2021   IR INTRA CRAN STENT  05/12/2021   IR US GUIDE VASC ACCESS RIGHT  05/11/2021   RADIOLOGY WITH ANESTHESIA N/A 05/11/2021   Procedure: IR WITH ANESTHESIA - INTRACRANIAL STENT;  Surgeon: Pedro Earls, MD;  Location: [place] [facility];  Service: Radiology;  Laterality: N/A;   Social History:  reports that she has never smoked. She has never used smokeless tobacco. She reports that she does not drink alcohol and does not use drugs.  Family / Support Systems Marital Status: Divorced Patient Roles: Parent Children: Moody Bruins [phone]  Tabatha-daughter [phone] Other Supports: none Anticipated Caregiver: Self Ability/Limitations of Caregiver: Daughter's work and can only provide intermittent assist Caregiver Availability: Intermittent Family Dynamics: Close with daughter's but does not want to burden them, they work and have busy lives. Pt has no friends or neighbors who will check on her  Social History Preferred language: English Religion: Non-Denominational Cultural Background: No issues Education: HS Read: Yes Write: Yes Employment Status: Retired Public relations account executive Issues: No  issues Guardian/Conservator: None-according to MD pt is capable of making her own decisions while here   Abuse/Neglect Abuse/Neglect Assessment Can Be Completed: Yes Physical Abuse: Denies Verbal Abuse: Denies Sexual Abuse: Denies Exploitation of patient/patient's resources: Denies Self-Neglect: Denies  Emotional Status Pt's affect, behavior and adjustment status: Pt is quiet and only gives one word answers. She has been independent and drove her self to appointments or grocery store, etc Recent Psychosocial Issues: other health issues, very lmited supports Psychiatric History: History of depression/anxiety takes medications for this and finds it helpful. Would benefit from seeing neruo-psych while here if able to be scheduled Substance Abuse History: No issues  Patient / Family Perceptions, Expectations & Goals Pt/Family understanding of illness & functional limitations: Pt is able to explain her stroke and deficits. She hopes to do well and get back home at the level she was before this. She does talk with the MD and said understands her treatment plan going forward. Premorbid pt/family roles/activities: mom, retiree Anticipated changes in roles/activities/participation: resume Pt/family expectations/goals: Pt states: " I hope to go home doing what I did before for myself."  [employer]: None Premorbid Home Care/DME Agencies: Other (Comment) (has cane and grab bars) Transportation available at discharge: self daughter's hopefully will now if pt can not Resource referrals recommended: Neuropsychology  Discharge Planning Living Arrangements: Alone Support Systems: Children Type of Residence: Private residence Insurance Resources: Medicare, [place] (specify county) Pensions consultant: Radio broadcast assistant Screen Referred: No Living Expenses: Rent Money Management: Patient Does the patient have any problems obtaining your medications?: No Home  Management: self Patient/Family Preliminary Plans: Return home  to her apartment where she was independent. She does not have 24/7 care at discharge, so needs to be mod/i level. Will await therapy evaluations. Care Coordinator Barriers to Discharge: Lack of/limited family support Care Coordinator Anticipated Follow Up Needs: HH/OP  Clinical Impression Quiet female who only gave one word answers to this worker's questions. She needs to get to mod/I to return home safely due to daughter's may check on her. She was not convinced they would. She was driving prior to admission and when asked if they could provide transportation she was not sure. Will work on discharge plans. Does have insurance coverage if needed to go SNF. Work on discharge needs.  Elease Hashimoto 05/16/2021, 11:12 AM

## 2021-05-16 NOTE — Evaluation (Signed)
Physical Therapy Assessment and Plan  Patient Details  Name: Madison Davidson MRN: [id number] Date of Birth: [birth year]  PT Diagnosis: Abnormality of gait, Cognitive deficits, Difficulty walking, Impaired cognition, and Muscle weakness Rehab Potential: Good ELOS: 7-10 days   Today's Date: 05/16/2021 PT Individual Time: 1100-1157 PT Individual Time Calculation (min): 57 min    Hospital Problem: Principal Problem:   Large vessel stroke [facility])   Past Medical History:  Past Medical History:  Diagnosis Date   Cancer ([place])    skin   Coronary artery disease    Diabetes mellitus without complication ([place])    Hypertension    Stroke [facility])    Past Surgical History:  Past Surgical History:  Procedure Laterality Date   ABDOMINAL HYSTERECTOMY     IR ANGIO INTRA EXTRACRAN SEL INTERNAL CAROTID BILAT MOD SED  05/11/2021   IR ANGIO VERTEBRAL SEL VERTEBRAL UNI R MOD SED  05/11/2021   IR ANGIOGRAM EXTREMITY LEFT  05/11/2021   IR CT HEAD LTD  05/11/2021   IR INTRA CRAN STENT  05/11/2021   IR INTRA CRAN STENT  05/12/2021   IR US GUIDE VASC ACCESS RIGHT  05/11/2021   RADIOLOGY WITH ANESTHESIA N/A 05/11/2021   Procedure: IR WITH ANESTHESIA - INTRACRANIAL STENT;  Surgeon: Pedro Earls, MD;  Location: [facility];  Service: Radiology;  Laterality: N/A;    Assessment & Plan Clinical Impression: Patient is a 70 year old right-handed female with history of type 2 diabetes mellitus hypertension hyperlipidemia hypothyroidism obesity with BMI 32.90 CAD as well as prior CVA maintained on aspirin 81 mg daily.  Per chart review patient lives alone independent prior to admission.  1 level home 2 steps to entry.  She has family in the area that can provide intermittent supervision assistance as needed.  Presented to [facility] 05/08/2021 after a fall with brief loss of consciousness transient left-sided weakness and slurred speech.  Cranial CT scan showed ill-defined hypodensities right frontal white matter  compatible with ischemia of indeterminate age.  Possible subacute.  No acute hemorrhage.  MRI of the brain acute to early subacute right frontal lobe infarction.  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  Patient did not receive tPA.  Carotid ultrasound with no significant stenosis.  CT angiogram of head with marked stenosis of the right MCA origin and bifurcation.  Moderate left M2 MCA branch stenosis.  Patient was transferred to [facility] and underwent right distal M1 stenting per interventional radiology.  Maintained on aspirin 81 mg daily and Plavix 75 mg daily x3 months then aspirin alone given multifocal intracranial stenosis.  Echocardiogram with ejection fraction of 60 to 65% no wall motion abnormalities.  Subcutaneous Lovenox for DVT prophylaxis.  Dysphagia #2 thin liquid diet.  Due to patient decreased functional mobility left side weakness was admitted for a comprehensive rehab program.Patient transferred to CIR on 05/15/2021 .   Patient currently requires min with mobility secondary to muscle weakness, decreased cardiorespiratoy endurance, impaired timing and sequencing and decreased motor planning, decreased motor planning, decreased initiation, decreased awareness, decreased problem solving, decreased safety awareness, and delayed processing, and decreased standing balance and decreased balance strategies.  Prior to hospitalization, patient was independent  with mobility and lived with Alone in a [place] home.  Home access is 1Stairs to enter.  Patient will benefit from skilled PT intervention to maximize safe functional mobility, minimize fall risk, and decrease caregiver burden for planned discharge home with 24 hour supervision.  Anticipate patient will benefit from  follow up [place] at discharge.  PT - End of Session Activity Tolerance: Tolerates 30+ min activity with multiple rests Endurance Deficit: Yes Endurance Deficit Description: Pt reports BLE's feeling "weak"  during short distance ambulation PT Assessment Rehab Potential (ACUTE/IP ONLY): Good PT Barriers to Discharge: Decreased caregiver support;Insurance for SNF coverage;Home environment access/layout;Lack of/limited family support;Weight;Medication compliance PT Patient demonstrates impairments in the following area(s): Balance;Endurance;Motor;Safety;Skin Integrity PT Transfers Functional Problem(s): Bed Mobility;Car;Bed to Chair PT Locomotion Functional Problem(s): Ambulation;Stairs PT Plan PT Intensity: Minimum of 1-2 x/day ,45 to 90 minutes PT Frequency: 5 out of 7 days PT Duration Estimated Length of Stay: 7-10 days PT Treatment/Interventions: Ambulation/gait training;Discharge planning;Functional mobility training;Psychosocial support;Therapeutic Activities;Visual/perceptual remediation/compensation;Wheelchair propulsion/positioning;Therapeutic Exercise;Skin care/wound management;Neuromuscular re-education;Disease management/prevention;Balance/vestibular training;Cognitive remediation/compensation;DME/adaptive equipment instruction;Pain management;Splinting/orthotics;UE/LE Strength taining/ROM;UE/LE Coordination activities;Stair training;Patient/family education;Community reintegration PT Transfers Anticipated Outcome(s): supervision PT Locomotion Anticipated Outcome(s): supervision PT Recommendation Recommendations for Other Services: Neuropsych consult Follow Up Recommendations: Home health PT;24 hour supervision/assistance Patient destination: Home Equipment Recommended: To be determined   PT Evaluation Precautions/Restrictions Precautions Precautions: Fall Restrictions Weight Bearing Restrictions: No General Chart Reviewed: Yes Family/Caregiver Present: No Vital Signs Pain Pain Assessment Pain Scale: 0-10 Pain Score: 0-No pain Home Living/Prior Functioning Home Living Living Arrangements: Alone Available Help at Discharge: Family;Available PRN/intermittently Type of Home:  Apartment Home Access: Stairs to enter Entrance Stairs-Number of Steps: 1 Entrance Stairs-Rails: Can reach both;Left;Right Home Layout: One level Bathroom Shower/Tub: Tub/shower unit;Walk-in shower Bathroom Toilet: Standard Bathroom Accessibility: No Additional Comments: pt reports 3 falls in previous 12 months (per acute therapy note)  Lives With: Alone Prior Function Level of Independence: Independent with basic ADLs;Independent with homemaking with ambulation;Independent with gait  Able to Take Stairs?: Yes Driving: Yes Vocation: Retired Comments: pt drives, shops for groceries, manages finances and medications Vision/Perception  Vision - Assessment Eye Alignment: Within Passenger transport manager Range of Motion: Within Functional Limits Alignment/Gaze Preference: Within Defined Limits Tracking/Visual Pursuits: Able to track stimulus in all quads without difficulty Perception Perception: Impaired Inattention/Neglect: Does not attend to left visual field;Does not attend to left side of body Praxis Praxis: Impaired Praxis Impairment Details: Motor planning  Cognition Overall Cognitive Status: Impaired/Different from baseline Arousal/Alertness: Awake/alert Orientation Level: Oriented to person;Oriented to time;Disoriented to situation;Disoriented to place;Other (comment) (Reports she's in [facility]. SHe repors she drove last night as well) Memory: Impaired Memory Impairment: Storage deficit;Decreased recall of new information;Retrieval deficit Immediate Memory Recall: Sock;Blue;Bed Memory Recall Sock: Without Cue Memory Recall Blue: Without Cue Memory Recall Bed: Without Cue Awareness: Impaired Awareness Impairment: Intellectual impairment;Emergent impairment;Anticipatory impairment Problem Solving: Impaired Problem Solving Impairment: Verbal basic;Functional basic Sequencing: Impaired Sequencing Impairment: Functional basic Safety/Judgment: Impaired Comments: slow  processing and response time Sensation Sensation Light Touch: Appears Intact Hot/Cold: Appears Intact Proprioception: Appears Intact Stereognosis: Appears Intact Coordination Gross Motor Movements are Fluid and Coordinated: No Fine Motor Movements are Fluid and Coordinated: No Coordination and Movement Description: slow movement patterns associated with slow processing time Finger Nose Finger Test: functional on both sides Heel Shin Test: [facility] Motor  Motor Motor: Other (comment) Motor - Skilled Clinical Observations: general weakness   Trunk/Postural Assessment  Cervical Assessment Cervical Assessment: Within Functional Limits Thoracic Assessment Thoracic Assessment: Within Functional Limits Lumbar Assessment Lumbar Assessment: Within Functional Limits Postural Control Postural Control: Deficits on evaluation Trunk Control: needs A with dynamic balance Protective Responses: delayed  Balance Balance Balance Assessed: Yes Static Sitting Balance Static Sitting - Balance Support: Feet supported;No upper extremity supported Static Sitting - Level of Assistance: 5: Stand  by assistance Dynamic Sitting Balance Dynamic Sitting - Balance Support: Feet unsupported;During functional activity Dynamic Sitting - Level of Assistance: 5: Stand by assistance Static Standing Balance Static Standing - Balance Support: No upper extremity supported Static Standing - Level of Assistance: 5: Stand by assistance Dynamic Standing Balance Dynamic Standing - Balance Support: During functional activity;No upper extremity supported Dynamic Standing - Level of Assistance: 4: Min assist Extremity Assessment  RUE Assessment RUE Assessment: Within Functional Limits LUE Assessment LUE Assessment: Within Functional Limits RLE Assessment RLE Assessment: Exceptions to [facility] General Strength Comments: Grossly 4/5 LLE Assessment LLE Assessment: Exceptions to [facility] General Strength Comments: Grossly  4/5  Care Tool Care Tool Bed Mobility Roll left and right activity   Roll left and right assist level: Contact Guard/Touching assist    Sit to lying activity   Sit to lying assist level: Minimal Assistance - Patient > 75%    Lying to sitting edge of bed activity   Lying to sitting edge of bed assist level: Contact Guard/Touching assist     Care Tool Transfers Sit to stand transfer   Sit to stand assist level: Minimal Assistance - Patient > 75%    Chair/bed transfer   Chair/bed transfer assist level: Minimal Assistance - Patient > 75%     Toilet transfer   Assist Level: Minimal Assistance - Patient > 75%    Car transfer   Car transfer assist level: Minimal Assistance - Patient > 75%      Care Tool Locomotion Ambulation   Assist level: Minimal Assistance - Patient > 75% Assistive device: Walker-rolling Max distance: 125  Walk 10 feet activity   Assist level: Minimal Assistance - Patient > 75% Assistive device: Walker-rolling   Walk 50 feet with 2 turns activity   Assist level: Minimal Assistance - Patient > 75% Assistive device: Walker-rolling  Walk 150 feet activity Walk 150 feet activity did not occur: Safety/medical concerns (fatigue)      Walk 10 feet on uneven surfaces activity Walk 10 feet on uneven surfaces activity did not occur: Safety/medical concerns      Stairs   Assist level: Minimal Assistance - Patient > 75% Stairs assistive device: 2 hand rails Max number of stairs: 4  Walk up/down 1 step activity   Walk up/down 1 step (curb) assist level: Minimal Assistance - Patient > 75% Walk up/down 1 step or curb assistive device: 2 hand rails    Walk up/down 4 steps activity Walk up/down 4 steps assist level: Minimal Assistance - Patient > 75% Walk up/down 4 steps assistive device: 2 hand rails  Walk up/down 12 steps activity Walk up/down 12 steps activity did not occur: Safety/medical concerns      Pick up small objects from floor Pick up small object  from the floor (from standing position) activity did not occur: Safety/medical concerns      Wheelchair Will patient use wheelchair at discharge?: No   Wheelchair activity did not occur: N/A      Wheel 50 feet with 2 turns activity Wheelchair 50 feet with 2 turns activity did not occur: N/A    Wheel 150 feet activity Wheelchair 150 feet activity did not occur: N/A      Refer to Care Plan for Long Term Goals  SHORT TERM GOAL WEEK 1 PT Short Term Goal 1 (Week 1): STG = LTG due to ELOS  Recommendations for other services: Neuropsych  Skilled Therapeutic Intervention Mobility Bed Mobility Bed Mobility: Supine to Sit;Sit to Supine Supine to  Sit: Minimal Assistance - Patient > 75% Sit to Supine: Contact Guard/Touching assist Transfers Transfers: Sit to Stand;Stand to Sit;Stand Pivot Transfers Sit to Stand: Contact Guard/Touching assist Stand to Sit: Contact Guard/Touching assist Stand Pivot Transfers: Contact Guard/Touching assist;Minimal Assistance - Patient > 75% Stand Pivot Transfer Details: Verbal cues for safe use of DME/AE;Verbal cues for gait pattern;Verbal cues for precautions/safety;Tactile cues for posture;Tactile cues for weight shifting;Visual cues/gestures for precautions/safety Transfer (Assistive device): None Locomotion  Gait Ambulation: Yes Gait Assistance: Minimal Assistance - Patient > 75% Gait Distance (Feet): 125 Feet Assistive device: Rolling walker Gait Assistance Details: Manual facilitation for weight shifting;Verbal cues for technique;Verbal cues for precautions/safety;Verbal cues for sequencing;Verbal cues for gait pattern;Verbal cues for safe use of DME/AE;Visual cues/gestures for precautions/safety;Visual cues for safe use of DME/AE;Tactile cues for weight shifting Gait Gait: Yes Gait Pattern: Impaired Gait Pattern: Step-through pattern;Decreased step length - right;Decreased step length - left;Trunk flexed;Wide base of support;Poor foot clearance -  left;Poor foot clearance - right Gait velocity: decreased Stairs / Additional Locomotion Stairs: Yes Stairs Assistance: Minimal Assistance - Patient > 75% Stair Management Technique: Two rails Number of Stairs: 4 Wheelchair Mobility Wheelchair Mobility: No  Skilled Intervention: Pt received supine in bed to start session. She's agreeable to PT evaluation. No reports of pain. She's pleasant and cooperative throughout session, oriented to self and time only (reports she drove last night and that she's currently in the [facility]). She has grossly symmetrical strength throughout but does has generalized deconditioning and muscle weakness. She requires minA for bed mobility but is able to sit unsupported with SBA. She requires minA for sit<>stand and stand<>pivot transfers with no AD. She is able to ambulate ~140f with minA and RW, gait distances limited by BLE muscle fatigue. She was capable of navigating 4 steps with minA and 2 hand rails with self selected reciprocal pattern. She was also able to complete car transfer with car height set to simulate a sedan, with CGA and RW. She was assisted back to her room in w/c and remained supine with all needs in reach and bed alarm on.   Instructed pt in results of PT evaluation as detailed above, PT POC, rehab potential, rehab goals, and discharge recommendations. Additionally discussed CIR's policies regarding fall safety and use of chair alarm and/or quick release belt. Pt verbalized understanding and in agreement. Will update pt's family members as they become available.   Discharge Criteria: Patient will be discharged from PT if patient refuses treatment 3 consecutive times without medical reason, if treatment goals not met, if there is a change in medical status, if patient makes no progress towards goals or if patient is discharged from hospital.  The above assessment, treatment plan, treatment alternatives and goals were discussed and mutually  agreed upon: by patient  CAlger SimonsPT, DPT 05/16/2021, 12:08 PM

## 2021-05-16 NOTE — Progress Notes (Signed)
Inpatient Rehabilitation  Patient information reviewed and entered into eRehab system by Lorece Keach Dominiq Fontaine, OTR/L.   Information including medical coding, functional ability and quality indicators will be reviewed and updated through discharge.    

## 2021-05-16 NOTE — Patient Care Conference (Signed)
Inpatient RehabilitationTeam Conference and Plan of Care Update Date: 05/16/2021   Time: 1:01 PM   Patient Name: Madison Davidson      Medical Record Number: [id number]  Date of Birth: 12-22-1950 Sex: Female         Room/Bed: 4W01C/4W01C-01 Payor Info: Payor: MEDICARE / Plan: MEDICARE PART A AND B / Product Type: *No Product type* /    Admit Date/Time:  05/15/2021  2:29 PM  Primary Diagnosis:  Large vessel stroke [facility])  Hospital Problems: Principal Problem:   Large vessel stroke [facility])    Expected Discharge Date: Expected Discharge Date: 05/25/21  Team Members Present: Physician leading conference: Dr. Leeroy Cha Care Coodinator Present: Dorien Chihuahua, RN, BSN, CRRN;Becky Dupree, LCSW Nurse Present: Dorien Chihuahua, RN PT Present: Ginnie Smart, PT OT Present: Elisabeth Most, OT SLP Present: Charolett Bumpers, SLP PPS Coordinator present : Ileana Ladd, PT     Current Status/Progress Goal Weekly Team Focus  Bowel/Bladder   pt has occasional urinary incontinence LBM 06/20  regain functional continence less episodes of incontinence. Normal bowel pattern  TT Q2H   Swallow/Nutrition/ Hydration             ADL's   min A  supervision  ADL training, functional mobility and balance, pt/family education, cognitive facilitation   Mobility             Communication             Safety/Cognition/ Behavioral Observations            Pain   pt denies pain  free of pain  assess pain qshift   Skin   puncture on radial area of L hand with bruising  abdominal bruises  skin remain intact and free of infection  assess skin qshift and prn     Discharge Planning:      Team Discussion: Patient with BP issues and uncontrolled DM; emphasize dietary modification education and medications. Vitamin D supplement added per MD. Mild confusion noted with limited verbalizations. Urinary urgency noted.  Patient on target to meet rehab goals: yes, min assist 100' with RW and min assist for  ADLs. Supervision goals set for discharge  *See Care Plan and progress notes for long and short-term goals.   Revisions to Treatment Plan:    Teaching Needs: Dietary modifications, medications, safety/supervision recommended, etc.  Current Barriers to Discharge: Decreased caregiver support and Home enviroment access/layout  Possible Resolutions to Barriers: Recommend supervision at discharge Family education     Medical Summary Current Status: uncontrolled BP, uncontrolled CBGs, delayed processing, vitamin D deficiency, obesity (BMI 32.74)  Barriers to Discharge: Medical stability  Barriers to Discharge Comments: uncontrolled BP, uncontrolled CBGs, delayed processing, vitamin D deficiency, obesity (BMI 32.74) Possible Resolutions to [employer] Focus: continue lisinopril, continue glypizide/metformin/insulin sliding scale, start high dose vitamin D supplement, provide dietary education   Continued Need for Acute Rehabilitation Level of Care: The patient requires daily medical management by a physician with specialized training in physical medicine and rehabilitation for the following reasons: Direction of a multidisciplinary physical rehabilitation program to maximize functional independence : Yes Medical management of patient stability for increased activity during participation in an intensive rehabilitation regime.: Yes Analysis of laboratory values and/or radiology reports with any subsequent need for medication adjustment and/or medical intervention. : Yes   I attest that I was present, lead the team conference, and concur with the assessment and plan of the team.   Dorien Chihuahua B 05/16/2021, 5:15 PM

## 2021-05-16 NOTE — Progress Notes (Signed)
PROGRESS NOTE   Subjective/Complaints: No complaints this morning WBC 11, up from last, will order UA/UC given confusion Hgb 10.5, uptrending -Interdisciplinary Team Conference today    ROS: denies pain  Objective:   No results found. Recent Labs    05/16/21 0459  WBC 11.0*  HGB 10.5*  HCT 32.4*  PLT 332   Recent Labs    05/15/21 0246 05/16/21 0459  NA 135 140  K 4.3 4.1  CL 102 104  CO2 23 23  GLUCOSE 203* 137*  BUN 22 20  CREATININE 0.92 0.72  CALCIUM 8.8* 8.8*    Intake/Output Summary (Last 24 hours) at 05/16/2021 1204 Last data filed at 05/16/2021 0813 Gross per 24 hour  Intake 360 ml  Output --  Net 360 ml        Physical Exam: Vital Signs Blood pressure (!) 167/52, pulse 62, temperature 98.2 F (36.8 C), temperature source Oral, resp. rate 18, height 5\' 2"  (1.575 m), weight 81.2 kg, SpO2 100 %. Gen: no distress, normal appearing HEENT: oral mucosa pink and moist, NCAT Cardio: Reg rate Chest: normal effort, normal rate of breathing Abd: soft, non-distended Ext: no edema Psych: pleasant, normal affect Skin: intact Neurological:    Comments: Patient is alert.  No acute distress.  Makes eye contact with examiner.  Speech is mildly dysarthric but intelligible.  Follows commands.  Fair insight and awareness. 4/5 strength throughout. Psychomotor slowing   Assessment/Plan: 1. Functional deficits which require 3+ hours per day of interdisciplinary therapy in a comprehensive inpatient rehab setting. Physiatrist is providing close team supervision and 24 hour management of active medical problems listed below. Physiatrist and rehab team continue to assess barriers to discharge/monitor patient progress toward functional and medical goals  Care Tool:  Bathing    Body parts bathed by patient: Right arm, Left arm, Chest, Abdomen, Front perineal area, Buttocks, Right upper leg, Left upper leg, Right  lower leg, Face   Body parts bathed by helper: Left lower leg     Bathing assist Assist Level: Minimal Assistance - Patient > 75%     Upper Body Dressing/Undressing Upper body dressing   What is the patient wearing?: Bra, Pull over shirt    Upper body assist Assist Level: Minimal Assistance - Patient > 75%    Lower Body Dressing/Undressing Lower body dressing      What is the patient wearing?: Underwear/pull up, Pants     Lower body assist Assist for lower body dressing: Minimal Assistance - Patient > 75%     Toileting Toileting    Toileting assist Assist for toileting: Minimal Assistance - Patient > 75%     Transfers Chair/bed transfer  Transfers assist     Chair/bed transfer assist level: Minimal Assistance - Patient > 75%     Locomotion Ambulation   Ambulation assist              Walk 10 feet activity   Assist           Walk 50 feet activity   Assist           Walk 150 feet activity   Assist  Walk 10 feet on uneven surface  activity   Assist           Wheelchair     Assist               Wheelchair 50 feet with 2 turns activity    Assist            Wheelchair 150 feet activity     Assist          Blood pressure (!) 167/52, pulse 62, temperature 98.2 F (36.8 C), temperature source Oral, resp. rate 18, height 5\' 2"  (1.575 m), weight 81.2 kg, SpO2 100 %.  Medical Problem List and Plan: 1.  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  -Interdisciplinary Team Conference today   2.  Impaired mobility, ambulating 125 feet -DVT/anticoagulation: Continue Lovenox             -antiplatelet therapy: Aspirin 81 mg daily and Plavix 75 mg  daily x3 months then aspirin alone 3. Pain Management: Tylenol 4. Depression: Celexa 10 mg daily. Melatonin as needed. Check magnesium level tomorrow. Provide emotional support             -antipsychotic agents: N/A 5. Neuropsych: This patient is capable of making decisions on her own behalf. 6. Skin/Wound Care: Routine skin checks 7. Fluids/Electrolytes/Nutrition: Routine in and outs with follow-up chemistries 8.  Hyperlipidemia.  Lipitor 9.  Dysphagia.  Dysphagia #2 thin liquids.  Follow-up speech therapy 10.  Diabetes mellitus.  Hemoglobin A1c 7.1.  Glucotrol 10 mg twice daily, Glucophage 1000 mg twice daily. CBGs elevated. Provide dietary education.  11.  Hypothyroidism.  Synthroid 12.  Hypertension.  SBP elevated, DBP soft, continue Lisinopril 10 mg daily.  Cr normal. Monitor with increased mobility. K and magnesium normal.  13. Vitamin D deficiency: start ergocalciferol 50,000U once weekly for 7 weeks.     LOS: 1 days A FACE TO FACE EVALUATION WAS PERFORMED  Clide Deutscher Jahaira Earnhart 05/16/2021, 12:04 PM

## 2021-05-16 NOTE — Plan of Care (Signed)
  Problem: Consults Goal: RH STROKE PATIENT EDUCATION Description: See Patient Education module for education specifics  Outcome: Progressing   Problem: RH BOWEL ELIMINATION Goal: RH STG MANAGE BOWEL WITH ASSISTANCE Description: STG Manage Bowel with mod I Assistance. Outcome: Progressing Goal: RH STG MANAGE BOWEL W/MEDICATION W/ASSISTANCE Description: STG Manage Bowel with Medication with mod I Assistance. Outcome: Progressing   Problem: RH SAFETY Goal: RH STG ADHERE TO SAFETY PRECAUTIONS W/ASSISTANCE/DEVICE Description: STG Adhere to Safety Precautions With cues/reminders Assistance/Device. Outcome: Progressing   Problem: RH KNOWLEDGE DEFICIT Goal: RH STG INCREASE KNOWLEDGE OF DIABETES Description: Patient will be able to manage DM with medications and dietary modifications using handouts and educational materials independently Outcome: Progressing Goal: RH STG INCREASE KNOWLEDGE OF HYPERTENSION Description: Patient will be able to manage HTN with medications and dietary modifications using handouts and educational materials independently Outcome: Progressing Goal: RH STG INCREASE KNOWLEDGE OF DYSPHAGIA/FLUID INTAKE Description: Patient will be able to manage dysphagia,  medications and dietary modifications using handouts and educational materials independently Outcome: Progressing Goal: RH STG INCREASE KNOWLEGDE OF HYPERLIPIDEMIA Description: Patient will be able to manage HLD with medications and dietary modifications using handouts and educational materials independently Outcome: Progressing Goal: RH STG INCREASE KNOWLEDGE OF STROKE PROPHYLAXIS Description: Patient will be able to manage secondary stroke risks with medications and dietary modifications using handouts and educational materials independently Outcome: Progressing

## 2021-05-16 NOTE — Progress Notes (Signed)
Patient ID: Madison Davidson, female   DOB: [birth year], 70 y.o.   MRN: [id number]  Spoke with daughter-[name] to inform team conference supervision level and target discharge date 6/30. Daughter reports Mom is stubborn and does what she wants to do, refused to come to the hospital for two days. They will do their best but may be time she is alone at home. Pt wants to go home and be in her familiar environment. Will work on discharge needs.

## 2021-05-16 NOTE — Progress Notes (Signed)
Inpatient Rehabilitation Center Individual Statement of Services  Patient Name:  Madison Davidson  Date:  05/16/2021  Welcome to the [facility].  Our goal is to provide you with an individualized program based on your diagnosis and situation, designed to meet your specific needs.  With this comprehensive rehabilitation program, you will be expected to participate in at least 3 hours of rehabilitation therapies Monday-Friday, with modified therapy programming on the weekends.  Your rehabilitation program will include the following services:  Physical Therapy (PT), Occupational Therapy (OT), Speech Therapy (ST), 24 hour per day rehabilitation nursing, Therapeutic Recreaction (TR), Neuropsychology, Care Coordinator, Rehabilitation Medicine, Nutrition Services, and Pharmacy Services  Weekly team conferences will be held on Tuesday to discuss your progress.  Your Inpatient Rehabilitation Care Coordinator will talk with you frequently to get your input and to update you on team discussions.  Team conferences with you and your family in attendance may also be held.  Expected length of stay: 7-10 days  Overall anticipated outcome: supervision level  Depending on your progress and recovery, your program may change. Your Inpatient Rehabilitation Care Coordinator will coordinate services and will keep you informed of any changes. Your Inpatient Rehabilitation Care Coordinator's name and contact numbers are listed  below.  The following services may also be recommended but are not provided by the [place] will be made to provide these services after discharge if needed.  Arrangements include referral to agencies that provide these services.  Your insurance has been verified to be:  medicare & medicaid Your primary doctor is:  [facility]  Pertinent information will be shared with your doctor and your insurance company.  Inpatient Rehabilitation Care Coordinator:  Ovidio Kin, [place] or Emilia Beck  Information discussed with and copy given to patient by: Elease Hashimoto, 05/16/2021, 11:15 AM

## 2021-05-16 NOTE — Evaluation (Signed)
Occupational Therapy Assessment and Plan  Patient Details  Name: Madison Davidson MRN: [id number] Date of Birth: [birth year]  OT Diagnosis: cognitive deficits and muscle weakness (generalized) Rehab Potential: Rehab Potential (ACUTE ONLY): Good ELOS: 7-10 days   Today's Date: 05/16/2021 OT Individual Time: 9563-8756 OT Individual Time Calculation (min): 75 min     Hospital Problem: Principal Problem:   Large vessel stroke [facility])   Past Medical History:  Past Medical History:  Diagnosis Date   Cancer ([place])    skin   Coronary artery disease    Diabetes mellitus without complication ([place])    Hypertension    Stroke [facility])    Past Surgical History:  Past Surgical History:  Procedure Laterality Date   ABDOMINAL HYSTERECTOMY     IR ANGIO INTRA EXTRACRAN SEL INTERNAL CAROTID BILAT MOD SED  05/11/2021   IR ANGIO VERTEBRAL SEL VERTEBRAL UNI R MOD SED  05/11/2021   IR ANGIOGRAM EXTREMITY LEFT  05/11/2021   IR CT HEAD LTD  05/11/2021   IR INTRA CRAN STENT  05/11/2021   IR INTRA CRAN STENT  05/12/2021   IR US GUIDE VASC ACCESS RIGHT  05/11/2021   RADIOLOGY WITH ANESTHESIA N/A 05/11/2021   Procedure: IR WITH ANESTHESIA - INTRACRANIAL STENT;  Surgeon: Pedro Earls, MD;  Location: [place];  Service: Radiology;  Laterality: N/A;    Assessment & Plan Clinical Impression: Madison Davidson is a 70 year old right-handed female with history of type 2 diabetes mellitus hypertension hyperlipidemia hypothyroidism obesity with BMI 32.90 CAD as well as prior CVA maintained on aspirin 81 mg daily.  Per chart review patient lives alone independent prior to admission.  1 level home 2 steps to entry.  She has family in the area that can provide intermittent supervision assistance as needed.  Presented to [facility] 05/08/2021 after a fall with brief loss of consciousness transient left-sided weakness and slurred speech.  Cranial CT scan showed ill-defined hypodensities right frontal white matter compatible  with ischemia of indeterminate age.  Possible subacute.  No acute hemorrhage.  MRI of the brain acute to early subacute right frontal lobe infarction.  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  Patient did not receive tPA.  Carotid ultrasound with no significant stenosis.  CT angiogram of head with marked stenosis of the right MCA origin and bifurcation.  Moderate left M2 MCA branch stenosis.  Patient was transferred to [facility] and underwent right distal M1 stenting per interventional radiology.  Maintained on aspirin 81 mg daily and Plavix 75 mg daily x3 months then aspirin alone given multifocal intracranial stenosis.  Echocardiogram with ejection fraction of 60 to 65% no wall motion abnormalities.  Subcutaneous Lovenox for DVT prophylaxis.  Dysphagia #2 thin liquid diet.  Due to patient decreased functional mobility left side weakness was admitted for a comprehensive rehab program. No current complaints.  .  Patient transferred to CIR on 05/15/2021 .    Patient currently requires min with basic self-care skills secondary to muscle weakness, decreased cardiorespiratoy endurance, decreased motor planning, decreased problem solving, decreased memory, and delayed processing, and decreased standing balance, decreased postural control, and decreased balance strategies.  Prior to hospitalization, patient could complete ADLS with independent .  Patient will benefit from skilled intervention to increase independence with basic self-care skills prior to discharge home with care partner.  Anticipate patient will require 24 hour supervision and follow up home health.  OT - End of Session Endurance Deficit: Yes Endurance Deficit Description: Pt  reports BLE's feeling "weak" during short distance ambulation OT Assessment Rehab Potential (ACUTE ONLY): Good OT Patient demonstrates impairments in the following area(s): Balance;Cognition;Endurance;Motor;Safety OT Basic ADL's Functional  Problem(s): Bathing;Dressing;Toileting OT Advanced ADL's Functional Problem(s): Light Housekeeping OT Transfers Functional Problem(s): Toilet;Tub/Shower OT Additional Impairment(s): None OT Plan OT Intensity: Minimum of 1-2 x/day, 45 to 90 minutes OT Frequency: 5 out of 7 days OT Duration/Estimated Length of Stay: 7-10 days OT Treatment/Interventions: Balance/vestibular training;Cognitive remediation/compensation;Discharge planning;Functional mobility training;DME/adaptive equipment instruction;Neuromuscular re-education;Patient/family education;Psychosocial support;Therapeutic Activities;Self Care/advanced ADL retraining;Therapeutic Exercise;UE/LE Strength taining/ROM;UE/LE Coordination activities OT Self Feeding Anticipated Outcome(s): independent OT Basic Self-Care Anticipated Outcome(s): supervision OT Toileting Anticipated Outcome(s): supervision OT Bathroom Transfers Anticipated Outcome(s): supervision OT Recommendation Patient destination: Home Follow Up Recommendations: Home health OT Equipment Recommended: Tub/shower seat   OT Evaluation Precautions/Restrictions  Precautions Precautions: Fall Restrictions Weight Bearing Restrictions: No  Pain Pain Assessment Pain Scale: 0-10 Pain Score: 0-No pain Home Living/Prior Functioning Home Living Family/patient expects to be discharged to:: Private residence Living Arrangements: Alone Available Help at Discharge: Family, Available PRN/intermittently Type of Home: Apartment Home Access: Stairs to enter Technical brewer of Steps: 1 Entrance Stairs-Rails: Can reach both, Left, Right Home Layout: One level Bathroom Shower/Tub: Tub/shower unit, Multimedia programmer: Standard Bathroom Accessibility: No Additional Comments: pt reports 3 falls in previous 12 months (per acute therapy note)  Lives With: Alone Prior Function Level of Independence: Independent with basic ADLs, Independent with homemaking with  ambulation, Independent with gait  Able to Take Stairs?: Yes Driving: Yes Vocation: Retired Comments: pt drives, shops for groceries, manages finances and medications Vision Baseline Vision/History: Wears glasses Wears Glasses: Distance only Patient Visual Report: No change from baseline Vision Assessment?: No apparent visual deficits Eye Alignment: Within Functional Limits Ocular Range of Motion: Within Functional Limits Alignment/Gaze Preference: Within Defined Limits Tracking/Visual Pursuits: Able to track stimulus in all quads without difficulty Visual Fields: No apparent deficits Perception  Perception: Impaired Inattention/Neglect: Does not attend to left visual field;Does not attend to left side of body Praxis Praxis: Impaired Praxis Impairment Details: Motor planning Cognition Overall Cognitive Status: Impaired/Different from baseline Arousal/Alertness: Awake/alert Orientation Level: Person;Place (I dont know why I am here, I did not know I had a stroke) Year: 2022 Month: June Day of Week: Incorrect Memory: Impaired Memory Impairment: Storage deficit;Decreased recall of new information;Retrieval deficit Immediate Memory Recall: Sock;Blue;Bed Memory Recall Sock: Without Cue Memory Recall Blue: Without Cue Memory Recall Bed: Without Cue Awareness: Impaired Awareness Impairment: Intellectual impairment;Emergent impairment;Anticipatory impairment Problem Solving: Impaired Problem Solving Impairment: Verbal basic;Functional basic Sequencing: Impaired Sequencing Impairment: Functional basic Safety/Judgment: Impaired Comments: slow processing and response time Sensation Sensation Light Touch: Appears Intact Hot/Cold: Appears Intact Proprioception: Appears Intact Stereognosis: Appears Intact Coordination Gross Motor Movements are Fluid and Coordinated: No Fine Motor Movements are Fluid and Coordinated: No Coordination and Movement Description: slow movement patterns  associated with slow processing time Finger Nose Finger Test: functional on both sides Heel Shin Test: [facility] Motor  Motor Motor: Other (comment) Motor - Skilled Clinical Observations: general weakness  Trunk/Postural Assessment  Cervical Assessment Cervical Assessment: Within Functional Limits Thoracic Assessment Thoracic Assessment: Within Functional Limits Lumbar Assessment Lumbar Assessment: Within Functional Limits Postural Control Postural Control: Deficits on evaluation Trunk Control: needs A with dynamic balance Protective Responses: delayed  Balance Balance Balance Assessed: Yes Static Sitting Balance Static Sitting - Balance Support: Feet supported;No upper extremity supported Static Sitting - Level of Assistance: 5: Stand by assistance Dynamic Sitting Balance Dynamic Sitting - Balance  Support: Feet unsupported;During functional activity Dynamic Sitting - Level of Assistance: 5: Stand by assistance Static Standing Balance Static Standing - Balance Support: No upper extremity supported Static Standing - Level of Assistance: 5: Stand by assistance Dynamic Standing Balance Dynamic Standing - Balance Support: During functional activity;No upper extremity supported Dynamic Standing - Level of Assistance: 4: Min assist Extremity/Trunk Assessment RUE Assessment RUE Assessment: Within Functional Limits LUE Assessment LUE Assessment: Within Functional Limits  Care Tool Care Tool Self Care Eating   Eating Assist Level: Set up assist    Oral Care     Set up    Bathing   Body parts bathed by patient: Right arm;Left arm;Chest;Abdomen;Front perineal area;Buttocks;Right upper leg;Left upper leg;Right lower leg;Face Body parts bathed by helper: Left lower leg   Assist Level: Minimal Assistance - Patient > 75%    Upper Body Dressing(including orthotics)   What is the patient wearing?: Bra;Pull over shirt   Assist Level: Minimal Assistance - Patient > 75%    Lower Body  Dressing (excluding footwear)   What is the patient wearing?: Underwear/pull up;Pants Assist for lower body dressing: Minimal Assistance - Patient > 75%    Putting on/Taking off footwear   What is the patient wearing?: Socks;Shoes Assist for footwear: Minimal Assistance - Patient > 75%       Care Tool Toileting Toileting activity   Assist for toileting: Minimal Assistance - Patient > 75%     Care Tool Bed Mobility Roll left and right activity   Roll left and right assist level: Contact Guard/Touching assist    Sit to lying activity   Sit to lying assist level: Minimal Assistance - Patient > 75%    Lying to sitting edge of bed activity   Lying to sitting edge of bed assist level: Contact Guard/Touching assist     Care Tool Transfers Sit to stand transfer   Sit to stand assist level: Minimal Assistance - Patient > 75%    Chair/bed transfer   Chair/bed transfer assist level: Minimal Assistance - Patient > 75%     Toilet transfer   Assist Level: Minimal Assistance - Patient > 75%     Care Tool Cognition Expression of Ideas and Wants Expression of Ideas and Wants: Some difficulty - exhibits some difficulty with expressing needs and ideas (e.g, some words or finishing thoughts) or speech is not clear   Understanding Verbal and Non-Verbal Content Understanding Verbal and Non-Verbal Content: Usually understands - understands most conversations, but misses some part/intent of message. Requires cues at times to understand   Memory/Recall Ability *first 3 days only Memory/Recall Ability *first 3 days only: That he or she is in a hospital/hospital unit;Current season    Refer to Care Plan for [place] 1 OT Short Term Goal 1 (Week 1): STGs = LTGs  Recommendations for other services: None    Skilled Therapeutic Intervention ADL ADL Eating: Set up Grooming: Setup Upper Body Bathing: Setup Where Assessed-Upper Body Bathing: Shower Lower Body  Bathing: Minimal assistance Where Assessed-Lower Body Bathing: Shower Upper Body Dressing: Minimal assistance Where Assessed-Upper Body Dressing: Edge of bed Lower Body Dressing: Minimal assistance Where Assessed-Lower Body Dressing: Edge of bed Toileting: Minimal assistance Where Assessed-Toileting: Glass blower/designer: Psychiatric nurse Method: Counselling psychologist: Emergency planning/management officer Transfer: Environmental education officer Method: Heritage manager: Shower seat with back;Grab bars Mobility  Bed Mobility Bed Mobility: Supine to Sit;Sit to Supine  Supine to Sit: Minimal Assistance - Patient > 75% Sit to Supine: Contact Guard/Touching assist Transfers Sit to Stand: Contact Guard/Touching assist Stand to Sit: Contact Guard/Touching assist  Pt seen for initial evaluation and ADL training with a focus on safety awareness, endurance, problem solving and processing. Explained role of OT to pt, discussed her goals and explained POC and ELOS. Pt participated well and put in good effort. See ADL documentation above.  Pt resting in bed with all needs met. Alarm set.    Discharge Criteria: Patient will be discharged from OT if patient refuses treatment 3 consecutive times without medical reason, if treatment goals not met, if there is a change in medical status, if patient makes no progress towards goals or if patient is discharged from hospital.  The above assessment, treatment plan, treatment alternatives and goals were discussed and mutually agreed upon: by patient  [facility] 05/16/2021, 12:30 PM

## 2021-05-16 NOTE — Evaluation (Signed)
Speech Language Pathology Assessment and Plan  Patient Details  Name: Madison Davidson MRN: [id number] Date of Birth: 06-12-1951  SLP Diagnosis: Cognitive Impairments;Dysphagia;Dysarthria  Rehab Potential: Good ELOS: 7-10 days    Today's Date: 05/16/2021 SLP Individual Time: 1300-1400 SLP Individual Time Calculation (min): 60 min   Hospital Problem: Principal Problem:   Large vessel stroke [facility])  Past Medical History:  Past Medical History:  Diagnosis Date   Cancer ([place])    skin   Coronary artery disease    Diabetes mellitus without complication ([place])    Hypertension    Stroke [facility])    Past Surgical History:  Past Surgical History:  Procedure Laterality Date   ABDOMINAL HYSTERECTOMY     IR ANGIO INTRA EXTRACRAN SEL INTERNAL CAROTID BILAT MOD SED  05/11/2021   IR ANGIO VERTEBRAL SEL VERTEBRAL UNI R MOD SED  05/11/2021   IR ANGIOGRAM EXTREMITY LEFT  05/11/2021   IR CT HEAD LTD  05/11/2021   IR INTRA CRAN STENT  05/11/2021   IR INTRA CRAN STENT  05/12/2021   IR US GUIDE VASC ACCESS RIGHT  05/11/2021   RADIOLOGY WITH ANESTHESIA N/A 05/11/2021   Procedure: IR WITH ANESTHESIA - INTRACRANIAL STENT;  Surgeon: Pedro Earls, MD;  Location: [place];  Service: Radiology;  Laterality: N/A;    Assessment / Plan / Recommendation Clinical Impression  Madison Davidson is a 70 year old right-handed female with history of type 2 diabetes mellitus hypertension hyperlipidemia hypothyroidism obesity with BMI 32.90 CAD as well as prior CVA maintained on aspirin 81 mg daily.  Per chart review patient lives alone independent prior to admission.  1 level home 2 steps to entry.  She has family in the area that can provide intermittent supervision assistance as needed.  Presented to [facility] 05/08/2021 after a fall with brief loss of consciousness transient left-sided weakness and slurred speech.  Cranial CT scan showed ill-defined hypodensities right frontal white matter compatible with ischemia  of indeterminate age.  Possible subacute.  No acute hemorrhage.  MRI of the brain acute to early subacute right frontal lobe infarction.  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  Patient did not receive tPA.  Carotid ultrasound with no significant stenosis.  CT angiogram of head with marked stenosis of the right MCA origin and bifurcation.  Moderate left M2 MCA branch stenosis.  Patient was transferred to [facility] and underwent right distal M1 stenting per interventional radiology.  Maintained on aspirin 81 mg daily and Plavix 75 mg daily x3 months then aspirin alone given multifocal intracranial stenosis.  Echocardiogram with ejection fraction of 60 to 65% no wall motion abnormalities.  Subcutaneous Lovenox for DVT prophylaxis.  Dysphagia #2 thin liquid diet.  Due to patient decreased functional mobility left side weakness was admitted for a comprehensive rehab program. No current complaints. Patient transferred to CIR on 05/15/2021 .    Pt presents with moderate oral dysphagia characterized by decreased bolus awareness and manipulation, mild pocketing in L cheek with decreased awareness. Pt demonstrating L anterior loss of saliva prior to BSE. Pt with no overt s/s aspiration with any solid trials (Dys 2, Dys 3 and regular) however as consistencies became more complex, pt with extended mastication time, increased oral stasis after swallow and increased residue in L buccal area. Pt reports s/s aspiration with thin liquids, however none observed with large, consecutive sips of thin liquid via straw. Recommend continuing Dys 2/thin diet at this time with intermittent supervision for use of  compensatory strategies.   Pt presents with moderately severe cognitive impairments as evidenced by SLUMS score 11/30 (25+=WFL for no high school education). Pt with significant impairment in insight and awareness of errors and cognitive deficits impacting safety at this time. Pt is a questionably  historian, but states she has had a mild decline in cognitive function. Pt generally oriented to situation with fluctuating orientation to time and place. Cognition is impacted by distractibility and decreased sustained attention. Affect is flat and pt generally answers in 1-2 words sentences. Pt does endorse a change in her speech d/t L facial and labial weakness, intelligibility ~70% for short phrases. Pt lives home alone with intermittent assist from daughters and would benefit from skilled ST to increase safety and independence with daily routine and consume safest and least restrictive diet before discharge.     Skilled Therapeutic Interventions          Pt participating in Bedside Swallow Examination, [place] Mental Status Examination (SLUMS) as well as further non-standardized assessments of cognitive function.   SLP Assessment  Patient will need skilled Speech Lanaguage Pathology Services during CIR admission    Recommendations  Medication Administration: Whole meds with liquid Supervision: Patient able to self feed;Intermittent supervision to cue for compensatory strategies Compensations: Minimize environmental distractions;Small sips/bites;Lingual sweep for clearance of pocketing;Follow solids with liquid Postural Changes and/or Swallow Maneuvers: Seated upright 90 degrees Oral Care Recommendations: Oral care BID Recommendations for Other Services: Neuropsych consult Patient destination: Home Follow up Recommendations: Home Health SLP Equipment Recommended: None recommended by SLP    SLP Frequency 3 to 5 out of 7 days   SLP Duration  SLP Intensity  SLP Treatment/Interventions 7-10 days  Minumum of 1-2 x/day, 30 to 90 minutes  Cognitive remediation/compensation;Therapeutic Activities;Cueing hierarchy;Functional tasks;Therapeutic Exercise;DME/adaptive equipment instruction;Dysphagia/aspiration precaution training;Medication managment;Patient/family education    Pain Pain  Assessment Pain Scale: 0-10 Pain Score: 0-No pain  Prior Functioning Cognitive/Linguistic Baseline: Information not available Type of Home: Apartment  Lives With: Alone Available Help at Discharge: Family;Available PRN/intermittently Vocation: Retired  SLP Evaluation Cognition Overall Cognitive Status: Impaired/Different from baseline Arousal/Alertness: Awake/alert Orientation Level: Oriented to person;Oriented to place;Oriented to situation Attention: Focused Focused Attention: Impaired Focused Attention Impairment: Verbal basic Sustained Attention: Impaired Sustained Attention Impairment: Verbal basic;Functional basic Memory: Impaired Memory Impairment: Storage deficit;Decreased recall of new information;Retrieval deficit Awareness: Impaired Awareness Impairment: Intellectual impairment;Emergent impairment;Anticipatory impairment Problem Solving: Impaired Problem Solving Impairment: Verbal basic;Functional basic Executive Function: Reasoning;Sequencing;Self Monitoring;Self Correcting;Initiating;Decision Making Reasoning: Impaired Sequencing: Impaired Sequencing Impairment: Functional basic Decision Making: Impaired Decision Making Impairment: Verbal basic;Functional basic Self Monitoring: Impaired Self Monitoring Impairment: Verbal basic;Functional basic Self Correcting: Impaired Self Correcting Impairment: Verbal basic;Functional basic Safety/Judgment: Impaired Comments: slow processing and response time  Comprehension Auditory Comprehension Overall Auditory Comprehension: Appears within functional limits for tasks assessed Expression Expression Primary Mode of Expression: Verbal Verbal Expression Overall Verbal Expression: Impaired Initiation: Impaired Automatic Speech: Name;Social Response Level of Generative/Spontaneous Verbalization: Phrase;Word Written Expression Dominant Hand: Right Oral Motor Oral Motor/Sensory Function Overall Oral Motor/Sensory  Function: Mild impairment Facial ROM: Suspected CN VII (facial) dysfunction Facial Symmetry: Abnormal symmetry left;Suspected CN VII (facial) dysfunction Facial Strength: Reduced left;Suspected CN VII (facial) dysfunction Facial Sensation: Within Functional Limits Lingual ROM: Within Functional Limits Lingual Symmetry: Within Functional Limits Lingual Strength: Within Functional Limits Lingual Sensation: Suspected CN VII (facial) dysfunction-anterior 2/3 tongue;Reduced Motor Speech Overall Motor Speech: Impaired Respiration: Within functional limits Articulation: Impaired Level of Impairment: Phrase Intelligibility: Intelligibility reduced Word: 75-100% accurate Phrase: 75-100% accurate  Sentence: 50-74% accurate Conversation: 50-74% accurate Motor Planning: Witnin functional limits Motor Speech Errors: Aware;Inconsistent Interfering Components: Inadequate dentition Effective Techniques: Slow rate;Increased vocal intensity;Over-articulate;Pacing  Care Tool Care Tool Cognition Expression of Ideas and Wants Expression of Ideas and Wants: Some difficulty - exhibits some difficulty with expressing needs and ideas (e.g, some words or finishing thoughts) or speech is not clear   Understanding Verbal and Non-Verbal Content Understanding Verbal and Non-Verbal Content: Usually understands - understands most conversations, but misses some part/intent of message. Requires cues at times to understand   Memory/Recall Ability *first 3 days only Memory/Recall Ability *first 3 days only: That he or she is in a hospital/hospital unit;Current season    Intelligibility: Intelligibility reduced Word: 75-100% accurate Phrase: 75-100% accurate Sentence: 50-74% accurate Conversation: 50-74% accurate  Bedside Swallowing Assessment General Previous Swallow Assessment: 05/12/21 BSE Diet Prior to this Study: Dysphagia 2 (chopped);Thin liquids Temperature Spikes Noted: No Respiratory Status: Room  air History of Recent Intubation: No Behavior/Cognition: Alert;Cooperative;Distractible Oral Cavity - Dentition: Edentulous;Dentures, not available Self-Feeding Abilities: Able to feed self Vision: Functional for self-feeding Patient Positioning: Upright in bed Baseline Vocal Quality: Normal Volitional Cough: Strong Volitional Swallow: Able to elicit  Oral Care Assessment Does patient have any of the following "high(er) risk" factors?: None of the above Patient is LOW RISK: Follow universal precautions (see row information) Ice Chips Ice chips: Not tested Thin Liquid Thin Liquid: Within functional limits Other Comments: pt reports coughing/choking with thin liquids Nectar Thick Nectar Thick Liquid: Not tested Honey Thick Honey Thick Liquid: Not tested Puree Puree: Not tested Solid Solid: Impaired Presentation: Self Fed Oral Phase Impairments: Impaired mastication Oral Phase Functional Implications: Left lateral sulci pocketing;Prolonged oral transit;Impaired mastication BSE Assessment Risk for Aspiration Impact on safety and function: Mild aspiration risk Other Related Risk Factors: Cognitive impairment  Short Term Goals: Week 1: SLP Short Term Goal 1 (Week 1): STG = LTG due to ELOS  Refer to Care Plan for Long Term Goals  Recommendations for other services: Neuropsych  Discharge Criteria: Patient will be discharged from SLP if patient refuses treatment 3 consecutive times without medical reason, if treatment goals not met, if there is a change in medical status, if patient makes no progress towards goals or if patient is discharged from hospital.  The above assessment, treatment plan, treatment alternatives and goals were discussed and mutually agreed upon: by patient  Dewaine Conger 05/16/2021, 1:41 PM

## 2021-05-17 DIAGNOSIS — I639 Cerebral infarction, unspecified: Secondary | ICD-10-CM | POA: Diagnosis not present

## 2021-05-17 LAB — GLUCOSE, CAPILLARY
Glucose-Capillary: 127 mg/dL — ABNORMAL HIGH (ref 70–99)
Glucose-Capillary: 185 mg/dL — ABNORMAL HIGH (ref 70–99)
Glucose-Capillary: 92 mg/dL (ref 70–99)

## 2021-05-17 MED ORDER — CEPHALEXIN 250 MG PO CAPS
500.0000 mg | ORAL_CAPSULE | Freq: Two times a day (BID) | ORAL | Status: DC
  Administered 2021-05-17 – 2021-05-18 (×3): 500 mg via ORAL
  Filled 2021-05-17 (×4): qty 2

## 2021-05-17 MED ORDER — RISAQUAD PO CAPS
2.0000 | ORAL_CAPSULE | Freq: Three times a day (TID) | ORAL | Status: DC
  Administered 2021-05-17 – 2021-05-29 (×36): 2 via ORAL
  Filled 2021-05-17 (×42): qty 2

## 2021-05-17 MED ORDER — LISINOPRIL 20 MG PO TABS
20.0000 mg | ORAL_TABLET | Freq: Every day | ORAL | Status: DC
  Administered 2021-05-18 – 2021-05-29 (×12): 20 mg via ORAL
  Filled 2021-05-17 (×12): qty 1

## 2021-05-17 NOTE — Progress Notes (Signed)
PROGRESS NOTE   Subjective/Complaints: R foot big toe was red yesterday- today less red but with poor toenail hygiene. Will need podiatry f/u Daughter asks about meals on wheels- appreciate SW discussing this with her, she does qualify  ROS: denies pain, +confusion as per therapy (stable as per daughter post-stroke)  Objective:   No results found. Recent Labs    05/16/21 0459  WBC 11.0*  HGB 10.5*  HCT 32.4*  PLT 332   Recent Labs    05/15/21 0246 05/16/21 0459  NA 135 140  K 4.3 4.1  CL 102 104  CO2 23 23  GLUCOSE 203* 137*  BUN 22 20  CREATININE 0.92 0.72  CALCIUM 8.8* 8.8*    Intake/Output Summary (Last 24 hours) at 05/17/2021 1008 Last data filed at 05/16/2021 1816 Gross per 24 hour  Intake 240 ml  Output --  Net 240 ml        Physical Exam: Vital Signs Blood pressure (!) 186/70, pulse 65, temperature 98.5 F (36.9 C), resp. rate 17, height 5\' 2"  (1.575 m), weight 81.2 kg, SpO2 100 %. Gen: no distress, normal appearing HEENT: oral mucosa pink and moist, NCAT Cardio: Reg rate Chest: normal effort, normal rate of breathing Abd: soft, non-distended Ext: no edema Psych: pleasant, normal affect Skin: intact Neurological:    Comments: Patient is alert.  No acute distress.  Makes eye contact with examiner.  Speech is mildly dysarthric but intelligible.  Follows commands.  Fair insight and awareness. 4/5 strength throughout. Psychomotor slowing   Assessment/Plan: 1. Functional deficits which require 3+ hours per day of interdisciplinary therapy in a comprehensive inpatient rehab setting. Physiatrist is providing close team supervision and 24 hour management of active medical problems listed below. Physiatrist and rehab team continue to assess barriers to discharge/monitor patient progress toward functional and medical goals  Care Tool:  Bathing    Body parts bathed by patient: Right arm, Left arm,  Chest, Abdomen, Front perineal area, Buttocks, Right upper leg, Left upper leg, Right lower leg, Face   Body parts bathed by helper: Left lower leg     Bathing assist Assist Level: Minimal Assistance - Patient > 75%     Upper Body Dressing/Undressing Upper body dressing   What is the patient wearing?: Bra, Pull over shirt    Upper body assist Assist Level: Minimal Assistance - Patient > 75%    Lower Body Dressing/Undressing Lower body dressing      What is the patient wearing?: Underwear/pull up, Pants     Lower body assist Assist for lower body dressing: Minimal Assistance - Patient > 75%     Toileting Toileting    Toileting assist Assist for toileting: Independent with assistive device     Transfers Chair/bed transfer  Transfers assist     Chair/bed transfer assist level: Minimal Assistance - Patient > 75%     Locomotion Ambulation   Ambulation assist      Assist level: Minimal Assistance - Patient > 75% Assistive device: Walker-rolling Max distance: 125   Walk 10 feet activity   Assist     Assist level: Minimal Assistance - Patient > 75% Assistive device: Walker-rolling  Walk 50 feet activity   Assist    Assist level: Minimal Assistance - Patient > 75% Assistive device: Walker-rolling    Walk 150 feet activity   Assist Walk 150 feet activity did not occur: Safety/medical concerns (fatigue)         Walk 10 feet on uneven surface  activity   Assist Walk 10 feet on uneven surfaces activity did not occur: Safety/medical concerns         Wheelchair     Assist Will patient use wheelchair at discharge?: No   Wheelchair activity did not occur: N/A         Wheelchair 50 feet with 2 turns activity    Assist    Wheelchair 50 feet with 2 turns activity did not occur: N/A       Wheelchair 150 feet activity     Assist  Wheelchair 150 feet activity did not occur: N/A       Blood pressure (!) 186/70, pulse  65, temperature 98.5 F (36.9 C), resp. rate 17, height 5\' 2"  (1.575 m), weight 81.2 kg, SpO2 100 %.  Medical Problem List and Plan: 1.  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  Continue CIR 2.  Impaired mobility, ambulating 125 feet -DVT/anticoagulation: Continue Lovenox             -antiplatelet therapy: Aspirin 81 mg daily and Plavix 75 mg daily x3 months then aspirin alone 3. Pain Management: Tylenol 4. Depression: Discussed with daughter. She was depressed prior to stroke.Celexa 10 mg daily. Check magnesium level tomorrow. Provide emotional support             -antipsychotic agents: N/A 5. Neuropsych: This patient is capable of making decisions on her own behalf. 6. Skin/Wound Care: Routine skin checks 7. Fluids/Electrolytes/Nutrition: Routine in and outs with follow-up chemistries 8.  Hyperlipidemia.  Lipitor 9.  Dysphagia.  Dysphagia #2 thin liquids.  Follow-up speech therapy 10.  Diabetes mellitus.  Hemoglobin A1c 7.1.  Glucotrol 10 mg twice daily, Glucophage 1000 mg twice daily. CBGs elevated. Provide dietary education.  11.  Hypothyroidism.  Synthroid 12.  Hypertension.  Increase Lisinopril to 20mg .  Cr normal. Monitor with increased mobility. K and magnesium normal.  13. Vitamin D deficiency: start ergocalciferol 50,000U once weekly for 7 weeks.  14. Insomnia: continue melatonin 3mg  PRN 15. UTI: start keflex. 60,000 GNR. Start probiotic. F/u sensitivities.     LOS: 2 days A FACE TO FACE EVALUATION WAS PERFORMED  Makendra Vigeant P Kharson Rasmusson 05/17/2021, 10:08 AM

## 2021-05-17 NOTE — Progress Notes (Signed)
Physical Therapy Session Note  Patient Details  Name: Madison Davidson MRN: [id number] Date of Birth: 23-Jan-1951  Today's Date: 05/17/2021 PT Individual Time: 1030-1200 PT Individual Time Calculation (min): 90 min   Short Term Goals: Week 1:  PT Short Term Goal 1 (Week 1): STG = LTG due to ELOS  Skilled Therapeutic Interventions/Progress Updates:    Pt received supine in bed, sleeping soundly. Daughter at bedside and had a lengthy discussion with her regarding pt's current mobility status, PT goals, stroke recovery, and DC planning. Daughter reports b/w her sister and herself, they will provide intermittent assistance and may be able to provide initial 24/7 S/A at DC. Daughter reports that the pt lives in low-income housing apartment that has a ramped entrance if needed. She reports her mother is "stubborn" and has difficulty with compliance but will cooperate during her time here. Also discussed how she will likely need assistance for iADLs such as medications, driving, and finances - recommend transportation assistance if possible as well. Pt also reports she is relatively sedentary at baseline, spends most of the time watching TV and [place]the country[place] to feed outdoor cats...  Pt with flat affect throughout session - difficult to comprehend at times due to hypophonic voice and dysarthria - encouraged her to speak loud and articulate words clearly to assist with communication.   She completed bed mobility with minA for trunk elevation. She donned tennis shoes with modA while seated EOB. Completed stand<>pivot transfer with CGA and no AD to her w/c. Wheeled to main rehab gym for time management. Retrieved an 18x18 w/c cushion to improve skin integrity and sitting posture while she sat in her w/c. Worked on functional gait training where she ambulated several bouts of ~129ft with CGA and RW - cues needed for RW management, keeping body within walker frame, and reducing downward gaze. Gait  speed 0.25 m/s, indicating increased falls risk and household ambulator. She also completed 5xSTS with support from arms in 22.5 seconds - scores > 15 seconds indicate increased falls risk.   Next, she was instructed to locate 3 items in the ADL kitchen - she was able to recall 2/3 items and located 1/3 items without cues. She demonstrated good ability to reach into tall cabinets but needs cues for safe RW management around the kitchen while managing cabinets and drawers. She completed furniture transfer from low sitting sofa couch with CGA and RW, effortful.   She ended session working on functional transfers with weighted med ball - completed 2x5 reps of sit<>stands with 3kg med ball with shldr press at the top of each stand, working on thoracic extension/elongation and standing balance. She also was able to navigate up/down 8 steps with CGA for ascent and minA for descent while using 2 hand rails.   Pt returned to her room at the end of session and assisted back to bed via stan<>pivot with CGA. Bed mobility completed with CGA and remained supine with needs in reach and bed alarm on.   Therapy Documentation Precautions:  Precautions Precautions: Fall Restrictions Weight Bearing Restrictions: No General:    Therapy/Group: Individual Therapy  Alger Simons 05/17/2021, 7:39 AM

## 2021-05-17 NOTE — Progress Notes (Signed)
Speech Language Pathology Daily Session Note  Patient Details  Name: Madison Davidson MRN: [id number] Date of Birth: [birth year]  Today's Date: 05/17/2021 SLP Individual Time: 1500-1530 SLP Individual Time Calculation (min): 30 min  Short Term Goals: Week 1: SLP Short Term Goal 1 (Week 1): STG = LTG due to ELOS  Skilled Therapeutic Interventions:   Patient seen for skilled ST session with focus on dysphagia goals. Patient appeared sleepy but was agreeable to session and did demonstrate adequate alertness during PO intake. She consumed Dys 2/3 solids (corn flakes soaked with milk) and did not exhibit any overt s/s aspiration or penetration but did exhibit oral phase deficits (mastication and oral transit of boluses). She had mild-moderate amount of residuals in right side buccal cavity which she did appear with some awareness to. Sips of thin liquids helped to fully clear oral cavity. Patient left in bed with all needs within reach. She continues to benefit from skilled SLP intervention to maximize cognitive and swallow function goals prior to discharge.  Pain Pain Assessment Pain Scale: 0-10 Pain Score: 0-No pain  Therapy/Group: Individual Therapy  Sonia Baller, MA, CCC-SLP Speech Therapy

## 2021-05-17 NOTE — Progress Notes (Signed)
Occupational Therapy Session Note  Patient Details  Name: Madison Davidson MRN: [id number] Date of Birth: 1951-06-11  Today's Date: 05/17/2021 OT Individual Time: 9702-6378 OT Individual Time Calculation (min): 73 min    Short Term Goals: Week 1:  OT Short Term Goal 1 (Week 1): STGs = LTGs  Skilled Therapeutic Interventions/Progress Updates:    Pt received in bed finishing breakfast. She did not want to eat the dys 2 soft food but did eat a little apple sauce and drank part of an ensure.  Pt moving slowly and needing significant cues with sitting to EOB with min A.   Pt's daughter, Madison Davidson, present and observed pt in therapy.  Pt handed her pants and then needed cues to don L leg first and to fully pull pants over feet before standing to pull over hips.  She needed A to fully pull pants over back of hips.   Cues and A with donning socks and shoes but able to tie shoes.  Pt then stated she puts on socks and shoes by propping one leg on bed, so will try that tomorrow.  She stood at sink to brush teeth and wash face with cues and set up.  Pt has difficulty with spacial awareness with how she is standing in front of a chair or bed and will need need cues to back up carefully prior to sitting.    Ambulated in hallway with RW with min A with cues to look forward vs at her feet. Pt has a slow gait pattern which her dtr said began to develop in the last few months.    Pt having difficulty processing and problem solving. The MD informed pt and dtr that the pt has a UTI.  So this may impact her cognition.  Discussed with dtr the need for pt to have 24/7 for at least the first month at home to ensure she will be safe to be alone for a few hours.  Pt returned to room and helped NT with making her bed.  One LOB with min A to recover as she was moving around the bed.    Pt resting in bed with alarm on and all needs met.  Therapy Documentation Precautions:  Precautions Precautions:  Fall Restrictions Weight Bearing Restrictions: No       Pain: Pain Assessment Pain Scale: 0-10 Pain Score: 0-No pain ADL: ADL Eating: Set up Grooming: Setup Upper Body Bathing: Setup Where Assessed-Upper Body Bathing: Shower Lower Body Bathing: Minimal assistance Where Assessed-Lower Body Bathing: Shower Upper Body Dressing: Minimal assistance Where Assessed-Upper Body Dressing: Edge of bed Lower Body Dressing: Minimal assistance Where Assessed-Lower Body Dressing: Edge of bed Toileting: Minimal assistance Where Assessed-Toileting: Glass blower/designer: Psychiatric nurse Method: Counselling psychologist: Emergency planning/management officer Transfer: Environmental education officer Method: Heritage manager: Shower seat with back, Grab bars   Therapy/Group: Individual Therapy  [place] 05/17/2021, 10:12 AM

## 2021-05-17 NOTE — Progress Notes (Addendum)
Patient ID: Madison Davidson, female   DOB: [birth year], 70 y.o.   MRN: [id number] Spoke with [name]-daughter via telephone to schedule family education they will come next Tuesday at 1-3:30 will place on mobile meals waiting list also. Both daughter's to be here for education next Tuesday.   2;24 PM Have placed pt on mobile meals waiting list in [employer] to reach out and talk with her or daughter.

## 2021-05-18 DIAGNOSIS — I639 Cerebral infarction, unspecified: Secondary | ICD-10-CM | POA: Diagnosis not present

## 2021-05-18 LAB — GLUCOSE, CAPILLARY
Glucose-Capillary: 129 mg/dL — ABNORMAL HIGH (ref 70–99)
Glucose-Capillary: 186 mg/dL — ABNORMAL HIGH (ref 70–99)
Glucose-Capillary: 95 mg/dL (ref 70–99)
Glucose-Capillary: 211 mg/dL — ABNORMAL HIGH (ref 70–99)

## 2021-05-18 LAB — URINE CULTURE: Culture: 60000 — AB

## 2021-05-18 MED ORDER — ONDANSETRON HCL 4 MG PO TABS: 4.0000 mg | ORAL_TABLET | Freq: Three times a day (TID) | ORAL | Status: DC | PRN

## 2021-05-18 MED ORDER — CALCIUM POLYCARBOPHIL 625 MG PO TABS
625.0000 mg | ORAL_TABLET | Freq: Two times a day (BID) | ORAL | Status: DC
  Administered 2021-05-18 – 2021-05-29 (×23): 625 mg via ORAL
  Filled 2021-05-18 (×23): qty 1

## 2021-05-18 NOTE — Progress Notes (Signed)
Speech Language Pathology Daily Session Note  Patient Details  Name: Madison Davidson MRN: [id number] Date of Birth: 02-Oct-1951  Today's Date: 05/18/2021 SLP Individual Time: 1300-1345 SLP Individual Time Calculation (min): 45 min  Short Term Goals: Week 1: SLP Short Term Goal 1 (Week 1): STG = LTG due to ELOS  Skilled Therapeutic Interventions:   Patient seen for skilled ST session with focus on cognitive-linguistic goals. Patient in bed with eyes closed but awakened easily. She was receptive to getting up and into [facility] to go to speech therapy room. Patient completed bed to [facility] transfer and vice versa when returning from therapy with supervision and CGA. Patient's verbal responses were very low in vocal intensity and SLP had to consistently request she repeat due to difficulty understanding her. She was very attentive and interactive with playing BINGO  with SLP and demonstrated appropriate selective attention in quiet environment with supervision A. Towards end of session patient started to become sleepy but was still able to transfer back into bed from [facility] with CGA. Patient continues to benefit from skilled SLP intervention to maximize cognitive-linguistic and swallow function prior to discharge.  Pain Pain Assessment Pain Scale: 0-10 Pain Score: 0-No pain  Therapy/Group: Individual Therapy  Sonia Baller, MA, CCC-SLP 05/18/21 3:58 PM

## 2021-05-18 NOTE — Progress Notes (Signed)
Physical Therapy Session Note  Patient Details  Name: Madison Davidson MRN: [id number] Date of Birth: February 16, 1951  Today's Date: 05/18/2021 PT Individual Time: 1100-1148 PT Individual Time Calculation (min): 48 min   Short Term Goals: Week 1:  PT Short Term Goal 1 (Week 1): STG = LTG due to ELOS  Skilled Therapeutic Interventions/Progress Updates:     Pt greeted sleeping soundly in bed, awakens to voice and is agreeable to therapy session. She denies pain. Flat affect throughout with minimal verbalization and when she did speak, hypophonic with dysarthria and difficult to understand - encouraged her to speak loudly to assist with communicating needs/wants. Supine<>sit completed with CGA with HOB elevated, slowed and effortful to move. She donned shoes at EOB with minA. Stand<>pivot transfer completed with CGA to w/c. Wheeled herself sinkside where she washed hands with setupA. Transported in w/c to main rehab gym for time management. Completed x3 trials of TUG with supervision and RW: 1) 31 seconds 2) 27 seconds 3) 26 seconds  TUG score > 13.5 seconds indicates increased falls risk.  Next, worked on Community education officer in rehab gym where x5 cones were placed randomly and strategically. She was instructed to locate cones and retrieve them - she was able to find all 5 cones and demonstrated good safety awareness with approaching them but needing min cues for RW management. She navigated up/down 8 small (3inch) steps with CGA and 2 hand rails while completing this task.  Next, worked on standing balance on compliant surface with blue air-ex foam pad. She was instructed to complete PVC puzzle in standing while unsupported - she required CGA for steadying while she did the puzzle and able to complete with only minimal cues/instruction. Unfortunately, during this activity she had a incontinent episode of BM.   Returned her to her room and assisted her to the bathroom with ambulatory transfer with CGA  and RW - NT called to assist with pericare and dressing change as she had a large loose BM. Therapist assisted with cleaning w/c and cushion. She missed 12 minutes of therapy due to bowel accident. Ended session with NT care.  Therapy Documentation Precautions:  Precautions Precautions: Fall Restrictions Weight Bearing Restrictions: No General:    Therapy/Group: Individual Therapy  Kouper Spinella P Kou Gucciardo PT 05/18/2021, 7:31 AM

## 2021-05-18 NOTE — Progress Notes (Signed)
Occupational Therapy Session Note  Patient Details  Name: Madison Davidson MRN: [id number] Date of Birth: 1951-01-14  Today's Date: 05/18/2021 OT Individual Time: 1345-1415 OT Individual Time Calculation (min): 30 min    Short Term Goals: Week 1:  OT Short Term Goal 1 (Week 1): STGs = LTGs  Skilled Therapeutic Interventions/Progress Updates:    Pt received in room in bed and consented to OT tx. Pt req min A and extensive time to transition from supine to sit EOB. Pt instructed in transfer with RW with min cuing for proper hand placement during transitions and anterior weight shift before standing. Pt walked to sit in w/c with CGA. Pt wheled down to gym for instruction and training in unilateral BUE strengthening HEP with 1#db to increase strength and activity tolerance for ADLs. Pt instructed in shoulder flexion, chest press, elbow flexion, and shoulder abduction for 3x15 with mod cuing for proper tech with fair carryover. Pt has flat affect and appears very sleepy throughout session, requires constant cues to open eyes/remain awake. After tx, pt helped back to bed and left with alarm on and all needs met.   Therapy Documentation Precautions:  Precautions Precautions: Fall Restrictions Weight Bearing Restrictions: No    Pain: Pain Assessment Pain Scale: 0-10 Pain Score: 0-No pain    Therapy/Group: Individual Therapy  Raeven Pint 05/18/2021, 12:55 PM

## 2021-05-18 NOTE — IPOC Note (Signed)
Overall Plan of Care [facility]) Patient Details Name: Madison Davidson MRN: [id number] DOB: 1951/01/06  Admitting Diagnosis: Large vessel stroke [facility])  Hospital Problems: Principal Problem:   Large vessel stroke [facility])     Functional Problem List: Nursing Bowel, Endurance, Medication Management, Safety  PT Balance, Endurance, Motor, Safety, Skin Integrity  OT Balance, Cognition, Endurance, Motor, Safety  SLP Cognition, Motor, Safety  TR         Basic ADL's: OT Bathing, Dressing, Toileting     Advanced  ADL's: OT Light Housekeeping     Transfers: PT Bed Mobility, Car, Bed to Chair  OT Toilet, Tub/Shower     Locomotion: PT Ambulation, Stairs     Additional Impairments: OT None  SLP Swallowing      TR      Anticipated Outcomes Item Anticipated Outcome  Self Feeding independent  Swallowing  Supervision   Basic self-care  supervision  Toileting  supervision   Bathroom Transfers supervision  Bowel/Bladder  manage bowel with mod I assist  Transfers  supervision  Locomotion  supervision  Communication     Cognition  Min A for basic cog  Pain  n/a  Safety/Judgment  maintain safety with cues/reminders   Therapy Plan: PT Intensity: Minimum of 1-2 x/day ,45 to 90 minutes PT Frequency: 5 out of 7 days PT Duration Estimated Length of Stay: 7-10 days OT Intensity: Minimum of 1-2 x/day, 45 to 90 minutes OT Frequency: 5 out of 7 days OT Duration/Estimated Length of Stay: 7-10 days SLP Intensity: Minumum of 1-2 x/day, 30 to 90 minutes SLP Frequency: 3 to 5 out of 7 days SLP Duration/Estimated Length of Stay: 7-10 days   Due to the current state of emergency, patients may not be receiving their 3-hours of Medicare-mandated therapy.   Team Interventions: Nursing Interventions Disease Management/Prevention, Bowel Management, Patient/Family Education, Discharge Planning, Medication Management  PT interventions Ambulation/gait training, Discharge planning,  Functional mobility training, Psychosocial support, Therapeutic Activities, Visual/perceptual remediation/compensation, Wheelchair propulsion/positioning, Therapeutic Exercise, Skin care/wound management, Neuromuscular re-education, Disease management/prevention, Training and development officer, Cognitive remediation/compensation, DME/adaptive equipment instruction, Pain management, Splinting/orthotics, UE/LE Strength taining/ROM, UE/LE Coordination activities, Stair training, Patient/family education, Community reintegration  OT Interventions Training and development officer, Cognitive remediation/compensation, Discharge planning, Functional mobility training, DME/adaptive equipment instruction, Neuromuscular re-education, Patient/family education, Psychosocial support, Therapeutic Activities, Self Care/advanced ADL retraining, Therapeutic Exercise, UE/LE Strength taining/ROM, UE/LE Coordination activities  SLP Interventions Cognitive remediation/compensation, Therapeutic Activities, Cueing hierarchy, Functional tasks, Therapeutic Exercise, DME/adaptive equipment instruction, Dysphagia/aspiration precaution training, Medication managment, Patient/family education  TR Interventions    SW/CM Interventions Discharge Planning, Psychosocial Support, Patient/Family Education   Barriers to Discharge MD  Medical stability and Lack of/limited family support  Nursing Decreased caregiver support, Home environment access/layout, Weight 1 level 2 ste bil rails solo, family nearby can provide intermittent supervision  PT Decreased caregiver support, Insurance for SNF coverage, Home environment access/layout, Lack of/limited family support, Weight, Medication compliance    OT      SLP Lack of/limited family support likely will need increased supervision/assist than family can provide at this time  SW Lack of/limited family support     Team Discharge Planning: Destination: PT-Home ,OT- Home , SLP-Home Projected  Follow-up: PT-Home health PT, 24 hour supervision/assistance, OT-  Home health OT, SLP-Home Health SLP Projected Equipment Needs: PT-To be determined, OT- Tub/shower seat, SLP-None recommended by SLP Equipment Details: PT- , OT-  Patient/family involved in discharge planning: PT- Patient,  OT-Patient, SLP-Patient  MD ELOS: 10 days Medical Rehab Prognosis:  Excellent Assessment:  Madison Davidson is a 70 year old woman who is admitted to CIR with left-sided weakness with dysarthria secondary to right corona radiata/S0 infarcts likely secondary to large vessel disease source with 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 Active medical issues include uncontrolled HTN, UTI, and uncontrolled type 2 DM. Medications are being managed, and labs and vitals are being monitored regularly.    See Team Conference Notes for weekly updates to the plan of care

## 2021-05-18 NOTE — Progress Notes (Signed)
PROGRESS NOTE   Subjective/Complaints: Had large incontinent BM this morning Tolerating therapy well.   ROS: denies pain, +confusion as per therapy (stable as per daughter post-stroke), +depression  Objective:   No results found. Recent Labs    05/16/21 0459  WBC 11.0*  HGB 10.5*  HCT 32.4*  PLT 332   Recent Labs    05/16/21 0459  NA 140  K 4.1  CL 104  CO2 23  GLUCOSE 137*  BUN 20  CREATININE 0.72  CALCIUM 8.8*    Intake/Output Summary (Last 24 hours) at 05/18/2021 1208 Last data filed at 05/18/2021 0700 Gross per 24 hour  Intake 560 ml  Output --  Net 560 ml        Physical Exam: Vital Signs Blood pressure (!) 157/51, pulse 65, temperature 98.4 F (36.9 C), temperature source Oral, resp. rate 17, height 5\' 2"  (1.575 m), weight 81.2 kg, SpO2 98 %. Gen: no distress, normal appearing HEENT: oral mucosa pink and moist, NCAT Cardio: Reg rate Chest: normal effort, normal rate of breathing Abd: soft, non-distended Ext: no edema Psych: pleasant, normal affect Skin: intact Neurological:    Comments: Patient is alert.  No acute distress.  Makes eye contact with examiner.  Speech is mildly dysarthric but intelligible.  Follows commands.  Fair insight and awareness. 4/5 strength throughout. Psychomotor slowing   Assessment/Plan: 1. Functional deficits which require 3+ hours per day of interdisciplinary therapy in a comprehensive inpatient rehab setting. Physiatrist is providing close team supervision and 24 hour management of active medical problems listed below. Physiatrist and rehab team continue to assess barriers to discharge/monitor patient progress toward functional and medical goals  Care Tool:  Bathing    Body parts bathed by patient: Right arm, Left arm, Chest, Abdomen, Front perineal area, Buttocks, Right upper leg, Left upper leg, Right lower leg, Face   Body parts bathed by helper: Left lower  leg     Bathing assist Assist Level: Minimal Assistance - Patient > 75%     Upper Body Dressing/Undressing Upper body dressing   What is the patient wearing?: Bra, Pull over shirt    Upper body assist Assist Level: Minimal Assistance - Patient > 75%    Lower Body Dressing/Undressing Lower body dressing      What is the patient wearing?: Underwear/pull up, Pants     Lower body assist Assist for lower body dressing: Minimal Assistance - Patient > 75%     Toileting Toileting    Toileting assist Assist for toileting: Contact Guard/Touching assist Assistive Device Comment:  (front wheel walker)   Transfers Chair/bed transfer  Transfers assist     Chair/bed transfer assist level: Contact Guard/Touching assist     Locomotion Ambulation   Ambulation assist      Assist level: Supervision/Verbal cueing Assistive device: Walker-rolling Max distance: 150'   Walk 10 feet activity   Assist     Assist level: Supervision/Verbal cueing Assistive device: Walker-rolling   Walk 50 feet activity   Assist    Assist level: Supervision/Verbal cueing Assistive device: Walker-rolling    Walk 150 feet activity   Assist Walk 150 feet activity did not occur: Safety/medical concerns (fatigue)  Assist level: Supervision/Verbal cueing Assistive device: Walker-rolling    Walk 10 feet on uneven surface  activity   Assist Walk 10 feet on uneven surfaces activity did not occur: Safety/medical concerns         Wheelchair     Assist Will patient use wheelchair at discharge?: No   Wheelchair activity did not occur: N/A         Wheelchair 50 feet with 2 turns activity    Assist    Wheelchair 50 feet with 2 turns activity did not occur: N/A       Wheelchair 150 feet activity     Assist  Wheelchair 150 feet activity did not occur: N/A       Blood pressure (!) 157/51, pulse 65, temperature 98.4 F (36.9 C), temperature source Oral, resp.  rate 17, height 5\' 2"  (1.575 m), weight 81.2 kg, SpO2 98 %.  Medical Problem List and Plan: 1.  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  Impaired mobility, ambulating 150 feet -DVT/anticoagulation: d/c Lovenox             -antiplatelet therapy: Aspirin 81 mg daily and Plavix 75 mg daily x3 months then aspirin alone 3. Pain Management: Tylenol 4. Depression: Discussed with daughter. She was depressed prior to stroke.Celexa 10 mg daily. Check magnesium level tomorrow. Provide emotional support             -antipsychotic agents: N/A 5. Neuropsych: This patient is capable of making decisions on her own behalf. 6. Skin/Wound Care: Routine skin checks 7. Fluids/Electrolytes/Nutrition: Routine in and outs with follow-up chemistries 8.  Hyperlipidemia.  Lipitor 9.  Dysphagia.  Dysphagia #2 thin liquids.  Follow-up speech therapy 10.  Diabetes mellitus.  Hemoglobin A1c 7.1.  Glucotrol 10 mg twice daily, Glucophage 1000 mg twice daily. CBGs elevated. Provide dietary education.  11.  Hypothyroidism.  Synthroid 12.  Hypertension.  Increase Lisinopril to 20mg .  Cr normal. Monitor with increased mobility. K and magnesium normal. Labile, continue to monitor.  13. Vitamin D deficiency: continue ergocalciferol 50,000U once weekly for 7 weeks.  14. Insomnia: continue melatonin 3mg  PRN 15. UTI: continue macrobid three days. 60,000 GNR. Start probiotic. F/u sensitivities.  16. Disposition: will schedule HFU    LOS: 3 days A FACE TO FACE EVALUATION WAS PERFORMED  Martha Clan P Clova Morlock 05/18/2021, 12:08 PM

## 2021-05-18 NOTE — Progress Notes (Signed)
Physical Therapy Note  Patient Details  Name: Madison Davidson MRN: [id number] Date of Birth: 06-Mar-1951 Today's Date: 05/18/2021  Missed time: 30 min.  Pt presents supine in bed and difficult to arouse.  Pt refusing therapy at this time, 2/2 fatigue.  Pt not enunciating words.  Spoke w/ RN re: speech and will assess.   [name] 05/18/2021, 3:26 PM

## 2021-05-18 NOTE — Progress Notes (Signed)
Occupational Therapy Session Note  Patient Details  Name: Madison Davidson MRN: [id number] Date of Birth: June 30, 1951  Today's Date: 05/18/2021 OT Individual Time: 0903-1001 OT Individual Time Calculation (min): 58 min    Short Term Goals: Week 1:  OT Short Term Goal 1 (Week 1): STGs = LTGs  Skilled Therapeutic Interventions/Progress Updates:    Pt received in room in bed and consented to OT tx. Pt req min A to transition from supine to sitting EOB and CGA to SPT with RW to w/c. Pt refused bathroom needs and was wheeled down to dayroom, instructed in 24 piece jumbo jigsaw puzzle while standing to increase functional activity tolerance and problem solving skills for ADLs. Once pt completed puzzle, she requested to use restroom. Pt brought back to her room and transferred to toilet with brief full of incontinent BM. Pt req mod A for hygiene thoroughness, completed all transfers with CGA walking with RW. After toileting, pt walked with RW and CGA and completed hand hygiene standing sink side with SUP. Pt brought back down to day room and instructed in cognitive color board matching activity to increase cognitive and problem solving skills. Pt able to complte 2 exercises with very minimal cuing for instruction. After tx, pt helped back to bed and left with all needs met, alarm on.   Therapy Documentation Precautions:  Precautions Precautions: Fall Restrictions Weight Bearing Restrictions: No  Vital Signs: Therapy Vitals Temp: 98.4 F (36.9 C) Temp Source: Oral Pulse Rate: 65 Resp: 17 BP: (!) 157/51 Patient Position (if appropriate): Lying Oxygen Therapy SpO2: 98 % O2 Device: Room Air Pain:   none    Therapy/Group: Individual Therapy  Bernardina Cacho 05/18/2021, 7:50 AM

## 2021-05-19 LAB — GLUCOSE, CAPILLARY
Glucose-Capillary: 109 mg/dL — ABNORMAL HIGH (ref 70–99)
Glucose-Capillary: 218 mg/dL — ABNORMAL HIGH (ref 70–99)
Glucose-Capillary: 160 mg/dL — ABNORMAL HIGH (ref 70–99)
Glucose-Capillary: 91 mg/dL (ref 70–99)

## 2021-05-19 MED ORDER — MICONAZOLE NITRATE 2 % EX CREA
TOPICAL_CREAM | Freq: Two times a day (BID) | CUTANEOUS | Status: DC
  Filled 2021-05-19 (×2): qty 28.4

## 2021-05-19 NOTE — Progress Notes (Signed)
Physical Therapy Session Note  Patient Details  Name: Madison Davidson MRN: [id number] Date of Birth: August 23, 1951  Today's Date: 05/19/2021 PT Individual Time: 1000-1043 PT Individual Time Calculation (min): 43 min   Short Term Goals: Week 1:  PT Short Term Goal 1 (Week 1): STG = LTG due to ELOS  Skilled Therapeutic Interventions/Progress Updates:    Pt greeted supine in bed to start session, agreeable to PT tx. No reports of pain. Daughter, [name], at bedside and remained present throughout session. Supine<>sit completed with supervision with use of bed features. Donned shoes with setupA while seated EOB. Stand<>pivot transfer completed with supervision and no AD from EOB to w/c with good safety awareness. Pt transported in w/c outdoors to reflection fountain with totalA for time management. While outdoors, practiced gait training on unlevel concrete surfaces with CGA (progressing to close supervision) and RW - cues for forward gaze and increased step height/clearance to avoid tripping over small obstacles such as rocks/mulch. Pt without LOB or knee buckling but gait speed remained decreased and unsteadiness present. She ambulated 2x271f. Practiced safety approach to various sitting surfaces such as park benches and chairs without arm rests. During seated rest breaks, discussed with daughter DC planning, home safety training, etc. Pt returned upstairs to her room at end of session in w/c and completed stand<>pivot transfer with supervision back to bed. Bed mobility completed with supervision but required assistance for scooting up in the bed for repositioning. She remained supine in bed with bed alarm on and all needs met at end of session.  Therapy Documentation Precautions:  Precautions Precautions: Fall Restrictions Weight Bearing Restrictions: No General:    Therapy/Group: Individual Therapy  Dary Dilauro P Fawzi Melman PT 05/19/2021, 7:43 AM

## 2021-05-19 NOTE — Progress Notes (Signed)
PROGRESS NOTE   Subjective/Complaints: No complaints initially this morning When asked, she does state that right big toe hurts: ordered topical antifungal cream. Outpatient podiatry f/u has been scheduled  ROS: denies pain, +confusion as per therapy (stable as per daughter post-stroke), +depression, +right big toe pain  Objective:   No results found. No results for input(s): WBC, HGB, HCT, PLT in the last 72 hours.  No results for input(s): NA, K, CL, CO2, GLUCOSE, BUN, CREATININE, CALCIUM in the last 72 hours.   Intake/Output Summary (Last 24 hours) at 05/19/2021 1231 Last data filed at 05/19/2021 0700 Gross per 24 hour  Intake 330 ml  Output --  Net 330 ml        Physical Exam: Vital Signs Blood pressure (!) 147/54, pulse 62, temperature 98 F (36.7 C), temperature source Oral, resp. rate 18, height 5\' 2"  (1.575 m), weight 81.2 kg, SpO2 96 %. Gen: no distress, normal appearing HEENT: oral mucosa pink and moist, NCAT Cardio: Reg rate Chest: normal effort, normal rate of breathing Abd: soft, non-distended Ext: no edema Psych: pleasant, normal affect Skin: +right big toe onychomycosis w/ large and twisted toe nail, erythema to big toe skin Neurological:    Comments: Patient is alert.  No acute distress.  Makes eye contact with examiner.  Speech is mildly dysarthric but intelligible.  Follows commands.  Fair insight and awareness. 4/5 strength throughout. Psychomotor slowing   Assessment/Plan: 1. Functional deficits which require 3+ hours per day of interdisciplinary therapy in a comprehensive inpatient rehab setting. Physiatrist is providing close team supervision and 24 hour management of active medical problems listed below. Physiatrist and rehab team continue to assess barriers to discharge/monitor patient progress toward functional and medical goals  Care Tool:  Bathing    Body parts bathed by patient:  Right arm, Left arm, Chest, Abdomen, Front perineal area, Buttocks, Right upper leg, Left upper leg, Right lower leg, Face   Body parts bathed by helper: Left lower leg     Bathing assist Assist Level: Minimal Assistance - Patient > 75%     Upper Body Dressing/Undressing Upper body dressing   What is the patient wearing?: Bra, Pull over shirt    Upper body assist Assist Level: Minimal Assistance - Patient > 75%    Lower Body Dressing/Undressing Lower body dressing      What is the patient wearing?: Underwear/pull up, Pants     Lower body assist Assist for lower body dressing: Minimal Assistance - Patient > 75%     Toileting Toileting    Toileting assist Assist for toileting: Contact Guard/Touching assist Assistive Device Comment:  (front wheel walker)   Transfers Chair/bed transfer  Transfers assist     Chair/bed transfer assist level: Contact Guard/Touching assist     Locomotion Ambulation   Ambulation assist      Assist level: Supervision/Verbal cueing Assistive device: Walker-rolling Max distance: 150'   Walk 10 feet activity   Assist     Assist level: Supervision/Verbal cueing Assistive device: Walker-rolling   Walk 50 feet activity   Assist    Assist level: Supervision/Verbal cueing Assistive device: Walker-rolling    Walk 150 feet activity  Assist Walk 150 feet activity did not occur: Safety/medical concerns (fatigue)  Assist level: Supervision/Verbal cueing Assistive device: Walker-rolling    Walk 10 feet on uneven surface  activity   Assist Walk 10 feet on uneven surfaces activity did not occur: Safety/medical concerns         Wheelchair     Assist Will patient use wheelchair at discharge?: No   Wheelchair activity did not occur: N/A         Wheelchair 50 feet with 2 turns activity    Assist    Wheelchair 50 feet with 2 turns activity did not occur: N/A       Wheelchair 150 feet activity      Assist  Wheelchair 150 feet activity did not occur: N/A       Blood pressure (!) 147/54, pulse 62, temperature 98 F (36.7 C), temperature source Oral, resp. rate 18, height 5\' 2"  (1.575 m), weight 81.2 kg, SpO2 96 %.  Medical Problem List and Plan: 1.  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 Discussed getting LifeAlert for patient, she does qualify for Meals on Wheels   Continue CIR 2.  Impaired mobility, ambulating 150 feet -DVT/anticoagulation: d/c Lovenox             -antiplatelet therapy: Continue Aspirin 81 mg daily and Plavix 75 mg daily x3 months then aspirin alone 3. Pain Management: Tylenol 4. Depression: Discussed with daughter. She was depressed prior to stroke.Celexa 10 mg daily. Check magnesium level tomorrow. Provide emotional support             -antipsychotic agents: N/A 5. Neuropsych: This patient is capable of making decisions on her own behalf. 6. Skin/Wound Care: Routine skin checks 7. Fluids/Electrolytes/Nutrition: Routine in and outs with follow-up chemistries 8.  Hyperlipidemia.  Lipitor 9.  Dysphagia.  Dysphagia #2 thin liquids.  Follow-up speech therapy 10.  Diabetes mellitus.  Hemoglobin A1c 7.1.  Glucotrol 10 mg twice daily, Glucophage 1000 mg twice daily. CBGs elevated. Provide dietary education.  11.  Hypothyroidism.  Synthroid 12.  Hypertension.  Increase Lisinopril to 20mg .  Cr normal. Improved. Monitor with increased mobility. K and magnesium normal.  13. Vitamin D deficiency: continue ergocalciferol 50,000U once weekly for 7 weeks.  14. Insomnia: continue melatonin 3mg  PRN 15. UTI: completed course of macrobid three days. 60,000 GNR. Start  probiotic. UC sensitive. 16. Right big toe onychomycosis: topical antifungal ordered and we have scheduled podiatry f/u for toenail clipping.  16. Disposition: HFU scheduled. D/c home with intermittent support from daughters on 6/30 (10 day stay).     LOS: 4 days A FACE TO FACE EVALUATION WAS PERFORMED  Madison Davidson 05/19/2021, 12:31 PM

## 2021-05-19 NOTE — Progress Notes (Signed)
Speech Language Pathology Daily Session Note  Patient Details  Name: Madison Davidson MRN: [id number] Date of Birth: [birth year]  Today's Date: 05/19/2021 SLP Individual Time: 0800-0830 SLP Individual Time Calculation (min): 30 min  Short Term Goals: Week 1: SLP Short Term Goal 1 (Week 1): STG = LTG due to ELOS  Skilled Therapeutic Interventions: Pt seen for skilled ST with focus on cognitive and dysphagia goals. Pt seen for AM meal of Dys 2/thin with SLP providing trials of Dys 3. Pt continues with oral phase impairments, mild pocketing this AM of both Dys 2 and Dys 3 textures with increased awareness and ability to clear independently with trained strategies. No significant difference in residue between Dys 2 and Dys 3, however pt does appear slightly more fatigued following mastication of Dys 3 textures (rest breaks and deep breaths). Would benefit from full trial tray of Dys 3 to determine endurance before considering upgrading diet. SLP facilitating basic orientation by providing mod A cues to scan environment to increase recall of time, place and recent events. Pt able to carryover information with 5 and 10 minute delay. Pt left in bed with alarm set and all needs within reach. Cont ST POC.   Pain Pain Assessment Pain Scale: 0-10 Pain Score: 0-No pain  Therapy/Group: Individual Therapy  Dewaine Conger 05/19/2021, 8:13 AM

## 2021-05-19 NOTE — Progress Notes (Signed)
Occupational Therapy Session Note  Patient Details  Name: TIFFANEY HEIMANN MRN: [id number] Date of Birth: 09-05-1951  Today's Date: 05/19/2021 OT Individual Time: 1330-1430 OT Individual Time Calculation (min): 60 min    Short Term Goals: Week 1:  OT Short Term Goal 1 (Week 1): STGs = LTGs  Skilled Therapeutic Interventions/Progress Updates:    Pt semi upright in bed, requesting to use the bathroom.  Pt completed all functional mobility with close supervision without AD including supine to sit, sit to stand from EOB, ambulation to bathroom, toilet transfer, and multiple sit<>stands at w/c.  Pt also completed toileting and hand hygiene in standing with supervision.  Pt self propelled w/c approximately 75 feet with supervision needing occasional Vcs to avoid obstacles on left side.  Pt participated in various standing tasks to challenge both static and dynamic standing balance as well as incorporate cognitive/visual perceptual component of problem solving, sequencing, visual scanning, and visual spatial awareness.  Pt required max Vcs and visual cues to successfully follow directions while completing large connect four game and only occasional question cues to complete medium peg copy task. Pt required one seated rest break due to reported discomfort in lower back.  Pt transported back to room and returned to bed.  Call bed in reach, bed alarm on at end of session.   Therapy Documentation Precautions:  Precautions Precautions: Fall Restrictions Weight Bearing Restrictions: No    Therapy/Group: Individual Therapy  [name] 05/19/2021, 2:45 PM

## 2021-05-19 NOTE — Progress Notes (Signed)
Patient stated she slept "pretty good" denies any pain resting in bed. Call light within reach bed alarm activated

## 2021-05-19 NOTE — Progress Notes (Signed)
Occupational Therapy Session Note  Patient Details  Name: Madison Davidson MRN: [id number] Date of Birth: [birth year]  Today's Date: 05/19/2021 OT Individual Time: 0830-0930 OT Individual Time Calculation (min): 60 min    Short Term Goals: Week 1:  OT Short Term Goal 1 (Week 1): STGs = LTGs  Skilled Therapeutic Interventions/Progress Updates:      Pt seen for BADL retraining of toileting, bathing, and dressing with a focus on problem solving, safety awareness and balance.  When pt fiP 121/65.  Dizziness resolved after sitting.  Pt completed shower and washed her hair but moves quite slowly. Need cues with safe positioning EOB with donning pants and socks to ensure she had safe, nonslip foot placement. Pt not very talkative today. Her daughter arrived halfway through her session.  Pt resting in bed with all needs met at end of session.  Bed alarm on.   ADL Eating: Set up Grooming: Setup Upper Body Bathing: Setup Where Assessed-Upper Body Bathing: Shower Lower Body Bathing: Minimal assistance Where Assessed-Lower Body Bathing: Shower Upper Body Dressing: Minimal assistance Where Assessed-Upper Body Dressing: Edge of bed Lower Body Dressing: Minimal assistance Where Assessed-Lower Body Dressing: Edge of bed Toileting: Minimal assistance Where Assessed-Toileting: Glass blower/designer: Psychiatric nurse Method: Counselling psychologist: Energy manager: Environmental education officer Method: Heritage manager: Civil engineer, contracting with back, Grab bars  Therapy Documentation Precautions:  Precautions Precautions: Fall Restrictions Weight Bearing Restrictions: No  Pain: Pain Assessment Pain Scale: 0-10 Pain Score: 0-No pain   Therapy/Group: Individual Therapy  [place] 05/19/2021, 8:28 AM

## 2021-05-19 NOTE — Progress Notes (Signed)
Occupational Therapy Session Note  Patient Details  Name: Madison Davidson MRN: [id number] Date of Birth: [birth year]  Today's Date: 05/19/2021 OT Individual Time: 5929-2446 OT Individual Time Calculation (min): 40 min    Short Term Goals: Week 1:  OT Short Term Goal 1 (Week 1): STGs = LTGs  Skilled Therapeutic Interventions/Progress Updates:    Pt received supine in bed, reporting fatigue but agreeable to OT session. Session focus on dynamic sitting and standing balance, problem-solving, AE training, and visual perceptual skills. Sup>sit min A at trunk at pt request due to fatigue. Sitting EOB and standing with RW, pt completed large 24 piece puzzle on floor using AE PRN, requiring reaching outside BOS to obtain pieces and place together with intermittent CGA-close spvsn. Pt demoed fair visual perceptual skills completing puzzle with only 2-3 errors (fie. not lining up edges of boundary pieces); required no vc's for self-correction of error. Difficult to determine error awareness due to limited verbosity during session. Sitting EOB, pt used no AE to connect pieces on floor, reaching fully to floor outside BOS without LOB. Pt education on safety of floor item retrieval at home and recommendations for reacher and other compensatory strategies. Pt reported feeling slightly dizzy when leaning far forward, improving with rest breaks. Sit<>stand close spvsn with RW; vc's for safe hand placement. In standing, pt used reacher in variety of ways without cues to connect pieces at floor level, demoing good problem solving skills to orient reacher and puzzle pieces correctly. Tolerated ~10 min standing with RW, intermittently with one or no hand support maintaining fair balance-CGA intermittently. Sit>sup close spvsn. Scooting HOB mod A with pt using bed rails to assist. Pt remained supine, call bell in reach, all needs met.  Therapy Documentation Precautions:  Precautions Precautions:  Fall Restrictions Weight Bearing Restrictions: No  Pain: Pain Assessment Pain Scale: 0-10 Pain Score: 0-No pain   Therapy/Group: Individual Therapy  Mellissa Kohut 05/19/2021, 4:57 PM

## 2021-05-20 LAB — GLUCOSE, CAPILLARY
Glucose-Capillary: 97 mg/dL (ref 70–99)
Glucose-Capillary: 103 mg/dL — ABNORMAL HIGH (ref 70–99)
Glucose-Capillary: 119 mg/dL — ABNORMAL HIGH (ref 70–99)
Glucose-Capillary: 182 mg/dL — ABNORMAL HIGH (ref 70–99)

## 2021-05-20 NOTE — Progress Notes (Signed)
Occupational Therapy Session Note  Patient Details  Name: Madison Davidson MRN: [id number] Date of Birth: 1950-12-27  Today's Date: 05/20/2021 OT Individual Time: [phone] and [id number] OT Individual Time Calculation (min): 53 min and 26 min   Short Term Goals: Week 1:  OT Short Term Goal 1 (Week 1): STGs = LTGs   Skilled Therapeutic Interventions/Progress Updates:    Pt greeted at time of session semireclined in bed resting agreeable to OT session but only after getting meds. Spoke with RN, present and performed med pass for pt to participate. Note that approx 5 mins after taking meds pt asking "where are me meds?" And reminded she had already taken them. Supine > sit Supervision and sit > stand from bed surface min guard from soft bed, all other transfers throughout supervision/CGA with RW. Stand pivot > wheelchair same manner, oral hygiene and face washing with set up, able to sequence cutting on/off water and making room on sink surface for items without cues. Grooming for brushing hair and putting up in clip same manner with extended time. Transported to gym via wheelchair and participated in cognitive task for following commands and problem solving, able to sequence 4 colors with approx 75% accuracy, at times forgetting order needing Min/Mod verbal cues to correct. No significant deficit noted in LUE during [facility]. Walked short distance in room back to bed, alarm on call bell in reach.  Session 2: Pt greeted at time of session sitting up in wheelchair agreeable to OT session, no pain reported. Needing to use bathroom, ambulated wheelchair <> toilet with close supervision/CGA with RW and performed toileting tasks same manner. Note asked pt to not stand up without therapist in bathroom, but did not wait and stood up prior to having therapist in bathroom. Standing to wash hands at sink surface same manner. Pt noted to have no cushion, sitting on pillow. Retrieved 18x18 standard cushion for  wheelchair for comfort and pressure relief, pt shaking head yes that she liked this. Up in wheelchair alarm on call bell in reach.   Therapy Documentation Precautions:  Precautions Precautions: Fall Restrictions Weight Bearing Restrictions: No     Therapy/Group: Individual Therapy  Viona Gilmore 05/20/2021, 7:10 AM

## 2021-05-20 NOTE — Progress Notes (Addendum)
Physical Therapy Session Note  Patient Details  Name: Madison Davidson MRN: [id number] Date of Birth: [birth year]  Today's Date: 05/20/2021 PT Individual Time: 1115-1203; [id number] PT Individual Time Calculation (min): 48 min , 60 min  Short Term Goals: Week 1:  PT Short Term Goal 1 (Week 1): STG = LTG due to ELOS  Skilled Therapeutic Interventions/Progress Updates:  Tx 1:  Pt resting in bed.  She denied pain.  Dtr [name] present.  In flat bed, no rails, min assist for rolling L to sit up.  Mod cues for components of movement.  In sitting, 5 x 1 L lateal leans.  Pt doffed non slip socks and donned L sock; she was unable to don R sock.  She donned bil shoes iwht set up.  Sit> stand> pivot iwht CGA.  neuromuscular re-education via forced use, multimodal cues for alternating reciprocal movement bil LEs, seated in wc, using Kinetron at resistance 40 cm/sec, x 2 minutes targeting quads, x 1 min targeting gluts.  Seated bil adductor squeezes with upright posture, arms across chest, x 25. Wc propulsion using bil UEs x 50' with supervision.  Gait training through obstacle course requiring side stepping L/R, CGA, RW, x 40'.  At end of session, pt seated in wc with needs at hand, seat pad alarm set, and dtr present.  Tx 2:  Pt resting in bed; she denied pain. .  VCs for strategies for sitting up, to R with supervision.  Seated 5 x 1 each R/L lateral leans.  Sit> stand with close supervision.  Gait training iwht RW over level tile and carpet, x 150' with CGA/close supervision.   neuromuscular re-education via forced use during therapeutic activity : biased to R standing during R hand clothes pin activity requiring wt shifting R/L, without LUE support, x 5 minutes. Standing with bil UE support: bil heel/toe raises, mini squats, hamstring curls, R/L hip abduction.   Seated rest break required between q set of exs. Advanced gait training without AD, kicking yoga block with L/R feet with  min/mod assist for LOB.  Therapeutic activity of kicking cones laterally in standing, R/LLE x 6 each, focusing on activation of hip abductors.  Seated reaching activity to promote forward wt shift, placing cones in rows on chair in front of her.  At end of session, pt seated in wc with seat pad alarm set and needs at hand.     Therapy Documentation Precautions:  Precautions Precautions: Fall Restrictions Weight Bearing Restrictions: No      Therapy/Group: Individual Therapy  Lamae Fosco 05/20/2021, 12:11 PM

## 2021-05-20 NOTE — Progress Notes (Signed)
PROGRESS NOTE   Subjective/Complaints: Patient states toe pain improved with topical antifungal She and her daughter at bedside have no complaints Denies pain. SBP elevated and DBP soft  ROS: +confusion as per therapy (stable as per daughter post-stroke), +depression, +right big toe pain improved  Objective:   No results found. No results for input(s): WBC, HGB, HCT, PLT in the last 72 hours.  No results for input(s): NA, K, CL, CO2, GLUCOSE, BUN, CREATININE, CALCIUM in the last 72 hours.   Intake/Output Summary (Last 24 hours) at 05/20/2021 1335 Last data filed at 05/20/2021 1319 Gross per 24 hour  Intake 420 ml  Output --  Net 420 ml        Physical Exam: Vital Signs Blood pressure (!) 148/48, pulse 63, temperature 98 F (36.7 C), temperature source Oral, resp. rate 16, height 5\' 2"  (1.575 m), weight 81.2 kg, SpO2 100 %. Gen: no distress, normal appearing HEENT: oral mucosa pink and moist, NCAT Cardio: Reg rate Chest: normal effort, normal rate of breathing Abd: soft, non-distended Ext: no edema Psych: pleasant, normal affect Skin: +right big toe onychomycosis w/ large and twisted toe nail, erythema to big toe skin Neurological:    Comments: Patient is alert.  No acute distress.  Makes eye contact with examiner.  Speech is mildly dysarthric but intelligible.  Follows commands.  Fair insight and awareness. 4/5 strength throughout. Psychomotor slowing   Assessment/Plan: 1. Functional deficits which require 3+ hours per day of interdisciplinary therapy in a comprehensive inpatient rehab setting. Physiatrist is providing close team supervision and 24 hour management of active medical problems listed below. Physiatrist and rehab team continue to assess barriers to discharge/monitor patient progress toward functional and medical goals  Care Tool:  Bathing    Body parts bathed by patient: Right arm, Left arm,  Chest, Abdomen, Front perineal area, Buttocks, Right upper leg, Left upper leg, Right lower leg, Face, Left lower leg   Body parts bathed by helper: Left lower leg     Bathing assist Assist Level: Supervision/Verbal cueing     Upper Body Dressing/Undressing Upper body dressing   What is the patient wearing?: Pull over shirt    Upper body assist Assist Level: Supervision/Verbal cueing    Lower Body Dressing/Undressing Lower body dressing      What is the patient wearing?: Underwear/pull up, Pants     Lower body assist Assist for lower body dressing: Contact Guard/Touching assist     Toileting Toileting    Toileting assist Assist for toileting: Contact Guard/Touching assist Assistive Device Comment:  (front wheel walker)   Transfers Chair/bed transfer  Transfers assist     Chair/bed transfer assist level: Contact Guard/Touching assist     Locomotion Ambulation   Ambulation assist      Assist level: Contact Guard/Touching assist Assistive device: Walker-rolling Max distance: 40   Walk 10 feet activity   Assist     Assist level: Supervision/Verbal cueing Assistive device: Walker-rolling   Walk 50 feet activity   Assist    Assist level: Supervision/Verbal cueing Assistive device: Walker-rolling    Walk 150 feet activity   Assist Walk 150 feet activity did not occur: Safety/medical concerns (  fatigue)  Assist level: Supervision/Verbal cueing Assistive device: Walker-rolling    Walk 10 feet on uneven surface  activity   Assist Walk 10 feet on uneven surfaces activity did not occur: Safety/medical concerns         Wheelchair     Assist Will patient use wheelchair at discharge?: No   Wheelchair activity did not occur: N/A         Wheelchair 50 feet with 2 turns activity    Assist    Wheelchair 50 feet with 2 turns activity did not occur: N/A       Wheelchair 150 feet activity     Assist  Wheelchair 150 feet  activity did not occur: N/A       Blood pressure (!) 148/48, pulse 63, temperature 98 F (36.7 C), temperature source Oral, resp. rate 16, height 5\' 2"  (1.575 m), weight 81.2 kg, SpO2 100 %.  Medical Problem List and Plan: 1.  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 Discussed getting LifeAlert for patient, she does qualify for Meals on Wheels   Continue CIR 2.  Impaired mobility, ambulating 150 feet -DVT/anticoagulation: d/c Lovenox             -antiplatelet therapy: Continue Aspirin 81 mg daily and Plavix 75 mg daily x3 months then aspirin alone 3. Pain Management: Tylenol 4. Depression: Discussed with daughter. She was depressed prior to stroke.Celexa 10 mg daily. Check magnesium level tomorrow. Provide emotional support             -antipsychotic agents: N/A 5. Neuropsych: This patient is capable of making decisions on her own behalf. 6. Skin/Wound Care: Routine skin checks 7. Fluids/Electrolytes/Nutrition: Routine in and outs with follow-up chemistries 8.  Hyperlipidemia.  Lipitor 9.  Dysphagia.  Dysphagia #2 thin liquids.  Follow-up speech therapy 10.  Diabetes mellitus.  Hemoglobin A1c 7.1.  Glucotrol 10 mg twice daily, Glucophage 1000 mg twice daily. CBGs elevated. Provide dietary education.  11.  Hypothyroidism.  Continue Synthroid 12.  Hypertension.  Continue Lisinopril to 20mg .  Cr normal. Improved. Monitor with increased mobility. K and magnesium normal.  13. Vitamin D deficiency: continue ergocalciferol 50,000U once weekly for 7 weeks.  14. Insomnia: continue melatonin 3mg  PRN 15. UTI: completed course of macrobid three days. 60,000 GNR. Start probiotic. UC sensitive. 16.  Right big toe onychomycosis: topical antifungal ordered and we have scheduled podiatry f/u for toenail clipping.  16. Disposition: HFU scheduled. D/c home with intermittent support from daughters on 6/30 (10 day stay).     LOS: 5 days A FACE TO FACE EVALUATION WAS PERFORMED  Martha Clan P Lene Mckay 05/20/2021, 1:35 PM

## 2021-05-21 LAB — GLUCOSE, CAPILLARY
Glucose-Capillary: 92 mg/dL (ref 70–99)
Glucose-Capillary: 97 mg/dL (ref 70–99)
Glucose-Capillary: 77 mg/dL (ref 70–99)
Glucose-Capillary: 164 mg/dL — ABNORMAL HIGH (ref 70–99)

## 2021-05-22 LAB — GLUCOSE, CAPILLARY
Glucose-Capillary: 100 mg/dL — ABNORMAL HIGH (ref 70–99)
Glucose-Capillary: 170 mg/dL — ABNORMAL HIGH (ref 70–99)
Glucose-Capillary: 143 mg/dL — ABNORMAL HIGH (ref 70–99)
Glucose-Capillary: 96 mg/dL (ref 70–99)

## 2021-05-22 NOTE — Progress Notes (Signed)
Occupational Therapy Session Note  Patient Details  Name: Madison Davidson MRN: [id number] Date of Birth: [birth year]  Today's Date: 05/22/2021 OT Individual Time: 1115-1150 OT Individual Time Calculation (min): 35 min    Short Term Goals: Week 1:  OT Short Term Goal 1 (Week 1): STGs = LTGs   Skilled Therapeutic Interventions/Progress Updates:    Pt asleep sitting up in w/c upon OT arrival.  Pt reports she needs to use bathroom and started standing up without warning.  Pt required max Vcs and Tcs to facilitate safe return to seated position until therapist able to lock w/c brakes and assist.  Upon further examination, therapist noted pt had incontinent episode of bowel.  Pt completed stand pivot w/c to EOB and sit to supine with CGA.  Pt required max assist to doff pants and total assist to doff brief due to loose stool.  Total assist required for pericare.  Pt able to roll to left and right with mod Vcs for body mechanics.  Pt intermittently falling asleep throughout session requiring multimodal cueing to increase alertness in order to follow commands.  Pt donned brief with total assist.  Donned pants with mod assist at bed level.  Pt able to bridge in order to move closer to [facility] with max step by step Vcs.  Pt requesting to sleep remainder of session.  Call bell in reach, bed alarm on.    Therapy Documentation Precautions:  Precautions Precautions: Fall Restrictions Weight Bearing Restrictions: No    Therapy/Group: Individual Therapy  [name] 05/22/2021, 3:15 PM

## 2021-05-22 NOTE — Progress Notes (Signed)
Speech Language Pathology Daily Session Note  Patient Details  Name: Madison Davidson MRN: [id number] Date of Birth: 1951/10/05  Today's Date: 05/22/2021 SLP Individual Time: 0488-8916 SLP Individual Time Calculation (min): 43 min  Short Term Goals: Week 1: SLP Short Term Goal 1 (Week 1): STG = LTG due to ELOS  Skilled Therapeutic Interventions:Skilled ST services focused on swallow and cognitive skills. Pt's fatigue and reduced alertness impacted participation during today's session and required max-mod A verbal cues to initiate most tasks. Pt consumed dys 3 texture trial snack with increase mastication time and only consuming very small bites. Pt supports wearing dentures at home during PO intake and they are not present in hospital. SLP recommends remaining on dys 2 textures until dentures are present. SLP facilitated basic problem solving and error awareness in 3 step card sequence task, pt was able to verbally sequence step, however functionally required mod A verbal cues for error awareness. Pt was left in room with call bell within reach and chair alarm set. SLP recommends to continue skilled services.     Pain Pain Assessment Pain Score: 0-No pain  Therapy/Group: Individual Therapy  Bama Hanselman  [facility] 05/22/2021, 12:44 PM

## 2021-05-22 NOTE — Progress Notes (Signed)
PROGRESS NOTE   Subjective/Complaints:  Pt reports no R toe pain- it's resolved (was fungal rash).  Denies pain overall- LBM last night.  Has been required to double void frequently- needed cathing last time was scanned for >600cc- will reinstitute PVRs/bladder scans.     ROS:  Pt denies SOB, abd pain, CP, N/V/C/D, and vision changes   Objective:   No results found. No results for input(s): WBC, HGB, HCT, PLT in the last 72 hours.  No results for input(s): NA, K, CL, CO2, GLUCOSE, BUN, CREATININE, CALCIUM in the last 72 hours.   Intake/Output Summary (Last 24 hours) at 05/22/2021 1914 Last data filed at 05/22/2021 1336 Gross per 24 hour  Intake 515 ml  Output --  Net 515 ml        Physical Exam: Vital Signs Blood pressure 138/68, pulse (!) 59, temperature 97.8 F (36.6 C), resp. rate 17, height 5\' 2"  (1.575 m), weight 81.2 kg, SpO2 98 %.    General: awake, alert, appropriate, quiet, laying in bed; RN in room; NAD HENT: conjugate gaze; oropharynx moist CV: regular rate; no JVD Pulmonary: a little rhonchi on R>L bases- good air movement B/L GI: soft, NT, ND, (+)BS Psychiatric: appropriate, but flat, quiet Neurological: alert, but not sure where she is- but knew was having therapy  Skin: R great toe- looks great- twisted nail, but no erythema, no wound- was wrapped.  Neurological:    Comments: Patient is alert.  No acute distress.  Makes eye contact with examiner.  Speech is mildly dysarthric but intelligible.  Follows commands.  Fair insight and awareness. 4/5 strength throughout. Psychomotor slowing   Assessment/Plan: 1. Functional deficits which require 3+ hours per day of interdisciplinary therapy in a comprehensive inpatient rehab setting. Physiatrist is providing close team supervision and 24 hour management of active medical problems listed below. Physiatrist and rehab team continue to assess barriers  to discharge/monitor patient progress toward functional and medical goals  Care Tool:  Bathing    Body parts bathed by patient: Right arm, Left arm, Chest, Abdomen, Front perineal area, Buttocks, Right upper leg, Left upper leg, Right lower leg, Face, Left lower leg   Body parts bathed by helper: Left lower leg     Bathing assist Assist Level: Supervision/Verbal cueing     Upper Body Dressing/Undressing Upper body dressing   What is the patient wearing?: Pull over shirt    Upper body assist Assist Level: Supervision/Verbal cueing    Lower Body Dressing/Undressing Lower body dressing      What is the patient wearing?: Underwear/pull up, Pants     Lower body assist Assist for lower body dressing: Contact Guard/Touching assist     Toileting Toileting    Toileting assist Assist for toileting: Contact Guard/Touching assist Assistive Device Comment:  (front wheel walker)   Transfers Chair/bed transfer  Transfers assist     Chair/bed transfer assist level: Contact Guard/Touching assist     Locomotion Ambulation   Ambulation assist      Assist level: Contact Guard/Touching assist Assistive device: Walker-rolling Max distance: 150   Walk 10 feet activity   Assist     Assist level: Contact Guard/Touching assist  Assistive device: Walker-rolling   Walk 50 feet activity   Assist    Assist level: Contact Guard/Touching assist Assistive device: Walker-rolling    Walk 150 feet activity   Assist Walk 150 feet activity did not occur: Safety/medical concerns (fatigue)  Assist level: Contact Guard/Touching assist Assistive device: Walker-rolling    Walk 10 feet on uneven surface  activity   Assist Walk 10 feet on uneven surfaces activity did not occur: Safety/medical concerns         Wheelchair     Assist Will patient use wheelchair at discharge?: No   Wheelchair activity did not occur: N/A         Wheelchair 50 feet with 2 turns  activity    Assist    Wheelchair 50 feet with 2 turns activity did not occur: N/A       Wheelchair 150 feet activity     Assist  Wheelchair 150 feet activity did not occur: N/A       Blood pressure 138/68, pulse (!) 59, temperature 97.8 F (36.6 C), resp. rate 17, height 5\' 2"  (1.575 m), weight 81.2 kg, SpO2 98 %.  Medical Problem List and Plan: 1.  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 Discussed getting LifeAlert for patient, she does qualify for Meals on Wheels   Continue CIR- PT, OT and SLP 2.  Impaired mobility, ambulating 150 feet -DVT/anticoagulation: d/c Lovenox             -antiplatelet therapy: Continue Aspirin 81 mg daily and Plavix 75 mg daily x3 months then aspirin alone 3. Pain Management: Tylenol  6/27- pt denied pain now that R toe looks good- con't tylenol prn 4. Depression: Discussed with daughter. She was depressed prior to stroke.Celexa 10 mg daily. Check magnesium level tomorrow. Provide emotional support             -antipsychotic agents: N/A 5. Neuropsych: This patient is capable of making decisions on her own behalf. 6. Skin/Wound Care: Routine skin checks 7. Fluids/Electrolytes/Nutrition: Routine in and outs with follow-up chemistries 8.  Hyperlipidemia.  Lipitor 9.  Dysphagia.  Dysphagia #2 thin liquids.  Follow-up speech therapy 10.  Diabetes mellitus.  Hemoglobin A1c 7.1.  Glucotrol 10 mg twice daily, Glucophage 1000 mg twice daily. CBGs elevated. Provide dietary education. 6/27- BG's 77-170 - maintain for now- and will monitor  11.  Hypothyroidism.  Continue Synthroid 12.  Hypertension.  Continue Lisinopril to 20mg .  Cr normal. Improved.  Monitor with increased mobility. K and magnesium normal.   6/27- BP well controlled- con't regimen 13. Vitamin D deficiency: continue ergocalciferol 50,000U once weekly for 7 weeks.  14. Insomnia: continue melatonin 3mg  PRN 15. UTI: completed course of macrobid three days. 60,000 GNR. Start probiotic. UC sensitive. 16. Right big toe onychomycosis: topical antifungal ordered and we have scheduled podiatry f/u for toenail clipping. 6/27- looks much better- con't antifungal, however got rid of dressing 17. Urinary retention 6/27- pt needs PVRs- is double voiding frequently- has had ONE scan today- all day- was 122- will monitor 18.  Disposition: HFU scheduled. D/c home with intermittent support from daughters on 6/30 (10 day  stay).     LOS: 7 days A FACE TO FACE EVALUATION WAS PERFORMED  Sacoya Mcgourty 05/22/2021, 7:14 PM

## 2021-05-22 NOTE — Progress Notes (Signed)
Physical Therapy Session Note  Patient Details  Name: Madison Davidson MRN: [id number] Date of Birth: 01-Jan-1951  Today's Date: 05/22/2021 PT Individual Time: 0832-0858 + 1415-1500 PT Individual Time Calculation (min): 26 min  + 45 min  Short Term Goals: Week 1:  PT Short Term Goal 1 (Week 1): STG = LTG due to ELOS  Skilled Therapeutic Interventions/Progress Updates:      1st session: Pt greeted supine in bed to start session and is agreeable to PT tx. No reports of pain but reports general drowsiness. Needs maxA for donning R sock and shoe, able to don L shoe with setupA. Supine<>sit completed with supervision with bed features. Stand<>pivot with supervision and no AD to w/c from EOB. Wheeled to day room rehab gym for time management. Gait training 132f with supervision and RW - cues for keeping body within walker frame and increased gait speed. No knee buckling or LOB noted. Brief seated rest break before performing ambulatory transfer with supervision and RW, ~280f to Nustep. Completed 5 minutes of Nustep at workload 4 using BLE and BUE, cues for maintaining cadence of 40 steps/minute and able to accomplish 190 steps total. Stand<>pivot transfer with supervision back to her w/c and wheeled back to her room. NT changing bed linen and pt agreeable to remain seated in chair. All needs met with handoff of care to NT.   2nd session: Pt sleeping soundly on arrival but awakens easily to voice and is agreeable to PT tx. No reports of pain, just drowsiness. Supine<>sit with supervision with use of bed features. Requires assistance for donning tennis shoes while she sat EOB. She reports need to use toilet, sit<>stand with supervision to RW and ambulates short distances in her room with supervision and RW. Able to manage lower body clothes in standing with supervision and pt continent of bladder, able to complete pericare without assist. Ambulates sinkside with supervision and RW for her to was her  hands. Stand>sit to w/c with supervision and then wheeled to main rehab gym for time management. Sit<>stand to RW with supervision and ambulated [id number] supervision and RW . Completed stair training where she negotiated up/down x8 steps with CGA and 2 hand rails via self selected reciprocal pattern for ascent and step-to pattern for descent. Next, completed ADL apartment furniture transfers with supervision to RW from low sitting sofa couch. She was returned to her room in w/c and completed stand<>pivot transfer back to bed. Of note, she was attempting to readjust her bed linen, she had x1 large LOB posteriorly that required modA for correcting with delayed righting corrections noted. She was able to complete bed mobility with supervision and needed mod cues for readjusting in bed. Bed alarm activated and all needs met.  Therapy Documentation Precautions:  Precautions Precautions: Fall Restrictions Weight Bearing Restrictions: No General:    Therapy/Group: Individual Therapy  ChrisAlger Simons/2022, 7:41 AM

## 2021-05-22 NOTE — Progress Notes (Signed)
Resting in bed, cooperative and pleasant, denies pain states right great toe is feeling better, area sensitive and tenderness to touch, slight redness, antifungal cream applied as ordered,c/o apin and medicated.Monitor and assisted

## 2021-05-23 ENCOUNTER — Inpatient Hospital Stay (HOSPITAL_COMMUNITY): Payer: Medicare Other

## 2021-05-23 LAB — GLUCOSE, CAPILLARY
Glucose-Capillary: 63 mg/dL — ABNORMAL LOW (ref 70–99)
Glucose-Capillary: 73 mg/dL (ref 70–99)
Glucose-Capillary: 237 mg/dL — ABNORMAL HIGH (ref 70–99)
Glucose-Capillary: 152 mg/dL — ABNORMAL HIGH (ref 70–99)
Glucose-Capillary: 84 mg/dL (ref 70–99)
Glucose-Capillary: 90 mg/dL (ref 70–99)

## 2021-05-23 IMAGING — CR DG CHEST 2V
2 series · 2 of 2 positions shown · non-contrast
Comparison: [DATE].

CLINICAL DATA: Tachypnea.

EXAM:
CHEST - 2 VIEW

[chest pa]
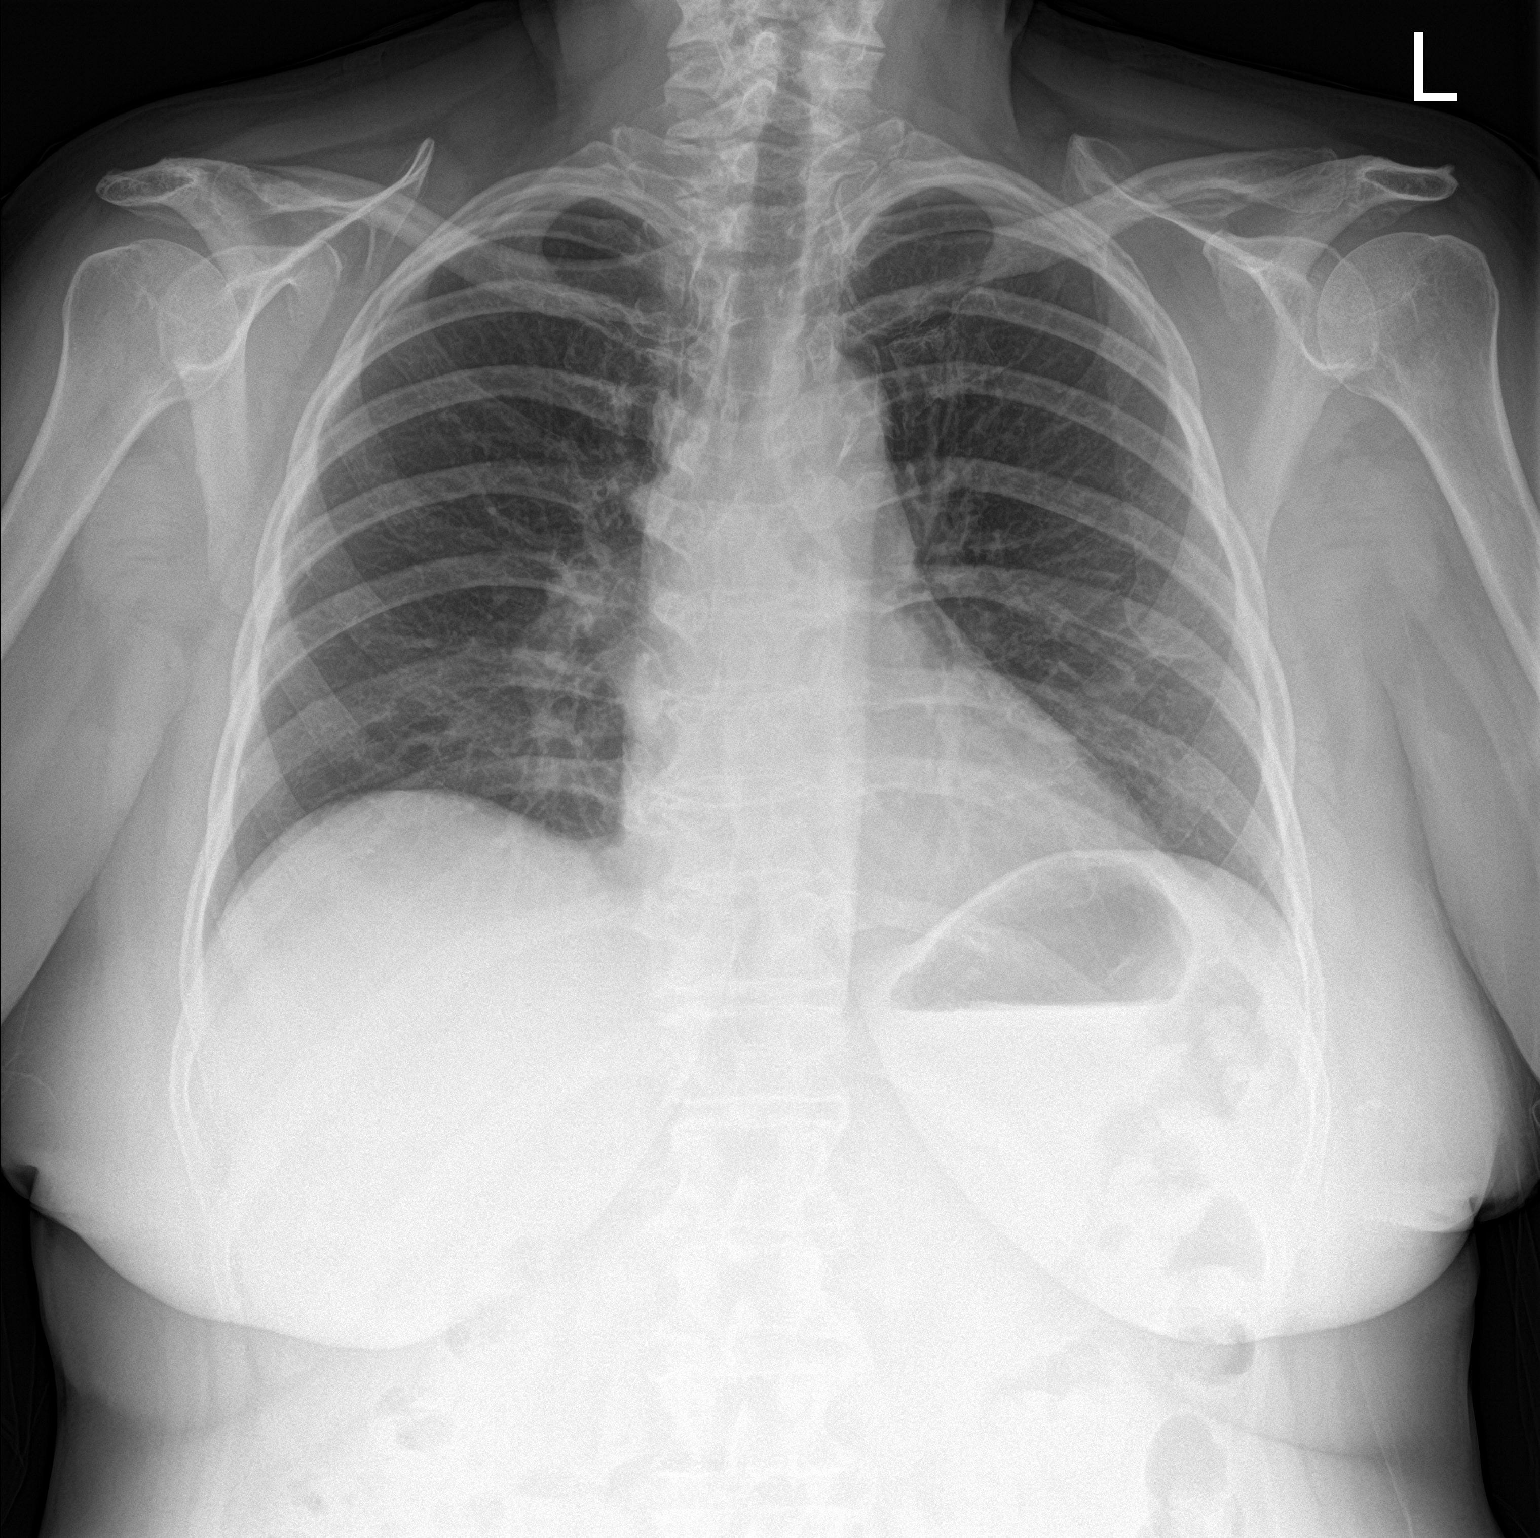

[chest lat]
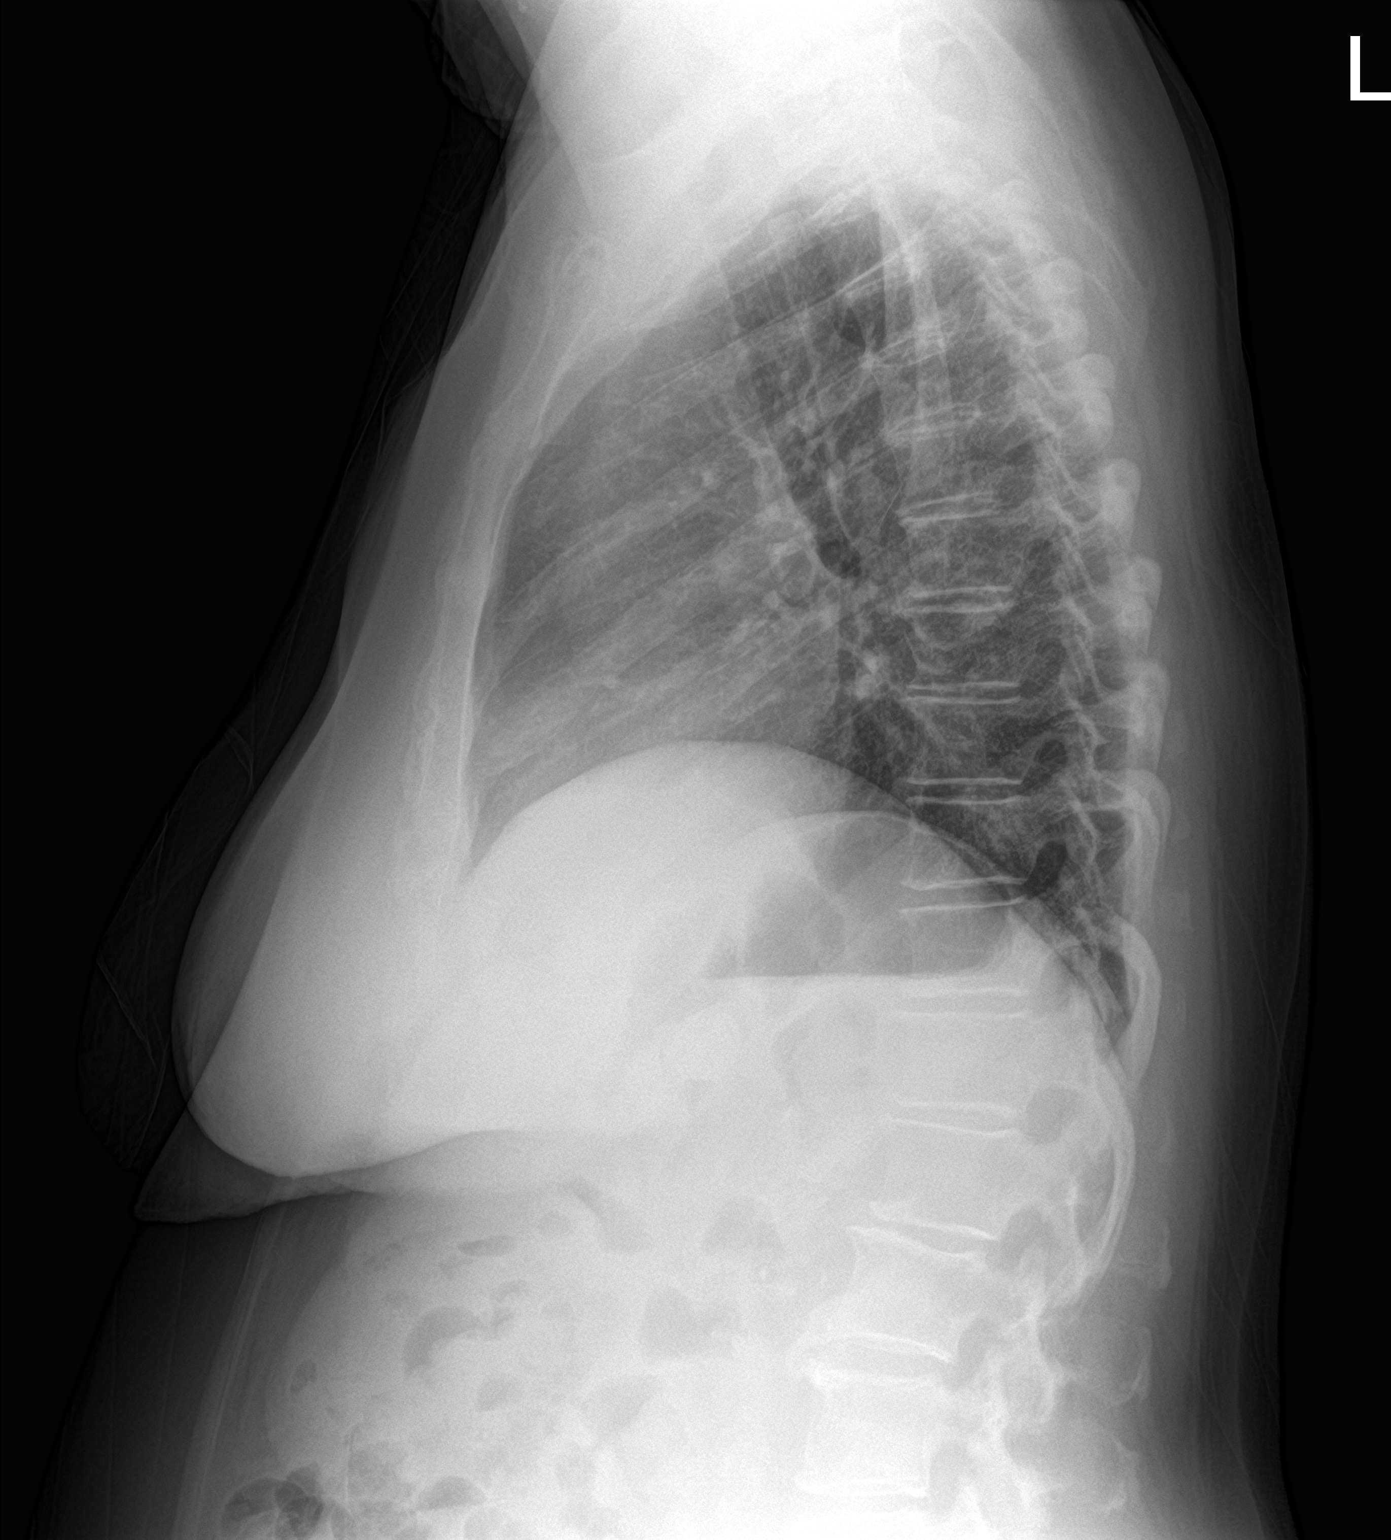

[2 of 2 positions shown; findings below may reference images not displayed]

FINDINGS: The heart size and mediastinal contours are within normal limits.
Both lungs are clear. The visualized skeletal structures are
unremarkable.
IMPRESSION: No active cardiopulmonary disease.

## 2021-05-23 MED ORDER — GLIPIZIDE 5 MG PO TABS
5.0000 mg | ORAL_TABLET | Freq: Two times a day (BID) | ORAL | Status: DC
  Administered 2021-05-23 – 2021-05-29 (×12): 5 mg via ORAL
  Filled 2021-05-23 (×12): qty 1

## 2021-05-23 NOTE — Progress Notes (Signed)
PROGRESS NOTE   Subjective/Complaints: Sleepy this morning Fatigued Systolic BP elevated, DBP soft Hypoglycemic to 63 this morning- decrease glipizide to 5mg  BID.     ROS:  Pt denies SOB, abd pain, CP, N/V/C/D, and vision changes, +fatigue- limiting therapy   Objective:   No results found. No results for input(s): WBC, HGB, HCT, PLT in the last 72 hours.  No results for input(s): NA, K, CL, CO2, GLUCOSE, BUN, CREATININE, CALCIUM in the last 72 hours.   Intake/Output Summary (Last 24 hours) at 05/23/2021 1038 Last data filed at 05/23/2021 0723 Gross per 24 hour  Intake 447 ml  Output --  Net 447 ml        Physical Exam: Vital Signs Blood pressure (!) 144/47, pulse 60, temperature 98.1 F (36.7 C), temperature source Oral, resp. rate 16, height 5\' 2"  (1.575 m), weight 81.2 kg, SpO2 96 %.  Gen: no distress, normal appearing HEENT: oral mucosa pink and moist, NCAT Cardio: Reg rate Chest: normal effort, normal rate of breathing Abd: soft, non-distended Ext: no edema Psych: pleasant, normal affect Skin: intact Neurological: alert, but not sure where she is- but knew was having therapy  Skin: R great toe- looks great- twisted nail, but no erythema, no wound- was wrapped.  Neurological:    Comments: Patient is alert.  No acute distress.  Makes eye contact with examiner.  Speech is mildly dysarthric but intelligible.  Follows commands.  Fair insight and awareness. 4/5 strength throughout. Psychomotor slowing   Assessment/Plan: 1. Functional deficits which require 3+ hours per day of interdisciplinary therapy in a comprehensive inpatient rehab setting. Physiatrist is providing close team supervision and 24 hour management of active medical problems listed below. Physiatrist and rehab team continue to assess barriers to discharge/monitor patient progress toward functional and medical goals  Care Tool:  Bathing     Body parts bathed by patient: Right arm, Left arm, Chest, Abdomen, Front perineal area, Buttocks, Right upper leg, Left upper leg, Right lower leg, Face, Left lower leg   Body parts bathed by helper: Left lower leg     Bathing assist Assist Level: Supervision/Verbal cueing     Upper Body Dressing/Undressing Upper body dressing   What is the patient wearing?: Pull over shirt    Upper body assist Assist Level: Supervision/Verbal cueing    Lower Body Dressing/Undressing Lower body dressing      What is the patient wearing?: Underwear/pull up, Pants     Lower body assist Assist for lower body dressing: Contact Guard/Touching assist     Toileting Toileting    Toileting assist Assist for toileting: Supervision/Verbal cueing Assistive Device Comment:  (front wheel walker)   Transfers Chair/bed transfer  Transfers assist     Chair/bed transfer assist level: Contact Guard/Touching assist     Locomotion Ambulation   Ambulation assist      Assist level: Contact Guard/Touching assist Assistive device: Walker-rolling Max distance: 150   Walk 10 feet activity   Assist     Assist level: Contact Guard/Touching assist Assistive device: Walker-rolling   Walk 50 feet activity   Assist    Assist level: Contact Guard/Touching assist Assistive device: Walker-rolling  Walk 150 feet activity   Assist Walk 150 feet activity did not occur: Safety/medical concerns (fatigue)  Assist level: Contact Guard/Touching assist Assistive device: Walker-rolling    Walk 10 feet on uneven surface  activity   Assist Walk 10 feet on uneven surfaces activity did not occur: Safety/medical concerns         Wheelchair     Assist Will patient use wheelchair at discharge?: No   Wheelchair activity did not occur: N/A         Wheelchair 50 feet with 2 turns activity    Assist    Wheelchair 50 feet with 2 turns activity did not occur: N/A        Wheelchair 150 feet activity     Assist  Wheelchair 150 feet activity did not occur: N/A       Blood pressure (!) 144/47, pulse 60, temperature 98.1 F (36.7 C), temperature source Oral, resp. rate 16, height 5\' 2"  (1.575 m), weight 81.2 kg, SpO2 96 %.  Medical Problem List and Plan: 1.  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 Discussed getting LifeAlert for patient, she does qualify for Meals on Wheels   Continue CIR- PT, OT and SLP 2.  Impaired mobility, ambulating 150 feet -DVT/anticoagulation: d/c Lovenox             -antiplatelet therapy: Continue Aspirin 81 mg daily and Plavix 75 mg daily x3 months then aspirin alone 3. Pain Management: Tylenol  6/27- pt denied pain now that R toe looks good- con't tylenol prn 4. Depression: Discussed with daughter. She was depressed prior to stroke.Celexa 10 mg daily.  Provide emotional support             -antipsychotic agents: N/A 5. Neuropsych: This patient is capable of making decisions on her own behalf. 6. Skin/Wound Care: Routine skin checks 7. Fluids/Electrolytes/Nutrition: Routine in and outs with follow-up chemistries 8.  Hyperlipidemia.  Lipitor 9.  Dysphagia.  Dysphagia #2 thin liquids.  Follow-up speech therapy 10.  Diabetes mellitus.  Hemoglobin A1c 7.1.  Glucotrol 10 mg twice daily, Glucophage 1000 mg twice daily. CBGs elevated. Provide dietary education. 6/28: hypoglycemic to 63: decrease glipizide to 5mg  BID.  11.  Hypothyroidism.  Continue Synthroid 12.  Hypertension.  Continue Lisinopril to 20mg .  Cr normal. Improved. Monitor with increased mobility. K and magnesium normal.   6/28- labile: continue regimen 13.  Vitamin D deficiency: continue ergocalciferol 50,000U once weekly for 7 weeks.  14. Insomnia: continue melatonin 3mg  PRN 15. UTI: completed course of macrobid three days. 60,000 GNR. Start probiotic. UC sensitive. 16. Right big toe onychomycosis: topical antifungal ordered and we have scheduled podiatry f/u for toenail clipping. 6/27- looks much better- con't antifungal, however got rid of dressing 17. Urinary retention 6/27- pt needs PVRs- is double voiding frequently- has had ONE scan today- all day- was 122- will monitor 18.  Disposition: HFU scheduled. D/c home with intermittent support from daughters on 6/30 (10 day stay).     LOS: 8 days A FACE TO FACE EVALUATION WAS PERFORMED  Madison Davidson 05/23/2021, 10:38 AM

## 2021-05-23 NOTE — Progress Notes (Signed)
Occupational Therapy Session Note  Patient Details  Name: Madison Davidson MRN: [id number] Date of Birth: 05/23/1951  Today's Date: 05/23/2021 OT Individual Time: 5597-4163 OT Individual Time Calculation (min): 57 min    Short Term Goals: Week 1:  OT Short Term Goal 1 (Week 1): STGs = LTGs  Skilled Therapeutic Interventions/Progress Updates:    Pt received in room in bed and consented to OT tx. Pt refused shower after much encouragement, however requested to use restroom. Pt walked from bed to toilet with RW and close SUP, completed 3/3 toileting tasks with close SUP and increased time. Pt then taken down to therapy gym via w/c or time mgmt, instructed in standing and reaching activity with no AD to increase dynamic balance and standing tolerance for increased safety during ADLs and IADLs. Pt requires multiple seated rest breaks due to fatigue. Pt demo'd no LOB throughout activity while reaching outside of BOS at various levels with no UE support. After tx, pt helped back to room and transferred back to bed walking from doorway of room to EOB with RW with SUP, left with alarm on and with all needs met.   Therapy Documentation Precautions:  Precautions Precautions: Fall Restrictions Weight Bearing Restrictions: No Vital Signs: Therapy Vitals Temp: 98.1 F (36.7 C) Temp Source: Oral Pulse Rate: 60 Resp: 16 BP: (!) 144/47 Patient Position (if appropriate): Lying Oxygen Therapy SpO2: 96 % O2 Device: Room Air Pain: none     Therapy/Group: Individual Therapy  Soledad Budreau 05/23/2021, 7:50 AM

## 2021-05-23 NOTE — Progress Notes (Signed)
Speech Language Pathology Daily Session Note  Patient Details  Name: FANY CAVANAUGH MRN: [id number] Date of Birth: [birth year]  Today's Date: 05/23/2021 SLP Individual Time: 1500-1530 SLP Individual Time Calculation (min): 30 min  Short Term Goals: Week 1: SLP Short Term Goal 1 (Week 1): STG = LTG due to ELOS  Skilled Therapeutic Interventions:Skilled ST services focused on education and cognitive skills. Pt's daughters were present for education. SLP facilitated assessment of current cognitive linguistic function administering formal SLUMS, pt scored 9 out 30 (n=>27) indicating severe deficits in sustained attention, short term recall, basic problem solving and error awareness. Pt's family supports some baseline deficits in attention and overall easily fatigued, but expressed noted exacerbation of deficits along with novel deficits in awareness/problem solving. Family agrees 24 hour supervision A is needed, but SLP is unsure if family is able to provide this. Pt was left in room with family, call bell within reach and bed alarm set. SLP recommends to continue skilled services.     Pain Pain Assessment Pain Score: 0-No pain  Therapy/Group: Individual Therapy  Allyn Bartelson  [facility] 05/23/2021, 3:48 PM

## 2021-05-23 NOTE — Discharge Summary (Signed)
Physician Discharge Summary  Patient ID: Madison Davidson MRN: [id number] DOB/AGE: 1951/03/05 70 y.o.  Admit date: 05/15/2021 Discharge date: 7/42022  Discharge Diagnoses:  Principal Problem:   Large vessel stroke ([place]) DVT prophylaxis Mood stabilization Hyperlipidemia Dysphagia Diabetes mellitus Hypothyroidism Hypertension Vitamin D deficiency Gram-negative rod UTI Right big toe onchomycosis Obesity CAD History of CVA Constipation AKI  Discharged Condition: Stable  Significant Diagnostic Studies: CT ANGIO HEAD W OR WO CONTRAST  Result Date: 05/09/2021 CLINICAL DATA:  Acute stroke on MRI brain EXAM: CT ANGIOGRAPHY HEAD TECHNIQUE: Multidetector CT imaging of the head was performed using the standard protocol during bolus administration of intravenous contrast. Multiplanar CT image reconstructions and MIPs were obtained to evaluate the vascular anatomy. CONTRAST:  33m OMNIPAQUE IOHEXOL 350 MG/ML SOLN COMPARISON:  CT 05/08/2021, correlation made with MRI brain 05/08/2021 FINDINGS: CT HEAD Brain: Evolving recent infarction is again identified within the right corona radiata. Small area of right frontal cortical involvement on the MRI is not visible by CT. There is no acute intracranial hemorrhage. No significant mass effect. Chronic infarct of the left basal ganglia and adjacent white matter with ex vacuo dilatation of the adjacent lateral ventricle. No hydrocephalus. No extra-axial collection. Vascular: There is atherosclerotic calcification at the skull base. Skull: Calvarium is unremarkable. Sinuses/Orbits: No acute finding. Other: None. CTA HEAD Anterior circulation: Intracranial internal carotid arteries are patent with calcified plaque but no significant stenosis. Anterior cerebral arteries are patent. There is eccentric plaque at the origin of the right MCA with marked stenosis. High-grade stenosis is present at the right MCA bifurcation. Left MCA is patent. There is moderate  stenosis of an anterior division M2 branch. Posterior circulation: Dominant intracranial right vertebral artery is patent. Proximal intracranial left vertebral artery is irregular with diminished enhancement. There is improved opacification more distally beginning at the PICA origin, which may reflect retrograde flow. Basilar artery is patent. Major cerebellar artery origins are patent. A left posterior communicating artery is present. There is irregularity of the left P2 PCA with moderate to marked stenosis. Venous sinuses: As permitted by contrast timing, patent. Review of the MIP images confirms the above findings. IMPRESSION: Evolving recent infarction of the right corona radiata. No acute intracranial hemorrhage. 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 Moderate left M2 MCA branch stenosis. Diffusely irregular left vertebral artery with diminished flow. There is improvement beginning at the PICA origin likely from retrograde flow. Irregular left P2 PCA with moderate to marked stenosis. Electronically Signed   By: PMacy MisM.D.   On: 05/09/2021 14:35   DG Chest 2 View  Result Date: 05/23/2021 CLINICAL DATA:  Tachypnea. EXAM: CHEST - 2 VIEW COMPARISON:  February 11, 2010. FINDINGS: The heart size and mediastinal contours are within normal limits. Both lungs are clear. The visualized skeletal structures are unremarkable. IMPRESSION: No active cardiopulmonary disease. Electronically Signed   By: JMarijo ConceptionM.D.   On: 05/23/2021 18:46   CT Head Wo Contrast  Result Date: 05/08/2021 CLINICAL DATA:  TIA.  Weakness and left knee pain EXAM: CT HEAD WITHOUT CONTRAST TECHNIQUE: Contiguous axial images were obtained from the base of the skull through the vertex without intravenous contrast. COMPARISON:  CT head 09/13/2017 FINDINGS: Brain: Ventricle size and cerebral volume normal for age. Ill-defined hypodensities in the right frontal white matter are new since the prior study. These are  compatible with infarct of indeterminate age, possibly subacute. Well-defined hypodensity left basal ganglia and internal capsule anteriorly compatible with chronic infarct.  This was not present previously. Negative for hemorrhage or mass. Vascular: Negative for hyperdense vessel Skull: Negative Sinuses/Orbits: Negative Other: None IMPRESSION: Ill-defined hypodensities right frontal white matter compatible with ischemia of indeterminate age. Possible subacute Chronic infarct left basal ganglia No acute hemorrhage. Electronically Signed   By: Franchot Gallo M.D.   On: 05/08/2021 13:39   MR BRAIN WO CONTRAST  Result Date: 05/08/2021 CLINICAL DATA:  Weakness. EXAM: MRI HEAD WITHOUT CONTRAST TECHNIQUE: Multiplanar, multiecho pulse sequences of the brain and surrounding structures were obtained without intravenous contrast. COMPARISON:  Head CT 05/08/2021 FINDINGS: Brain: There are acute to early subacute infarcts involving white matter in the right frontal lobe primarily at the level of the corona radiata, and there is also a single subcentimeter acute or early subacute cortical infarct laterally in the right frontal lobe. There is a chronic infarct in the left basal ganglia with associated chronic blood products. There is also a small chronic cortical infarct in the posteroinferior left occipital lobe. Mild cerebral atrophy is within normal limits for age. No mass, midline shift, or extra-axial fluid collection is evident. Vascular: Major intracranial vascular flow voids are preserved. Skull and upper cervical spine: Unremarkable bone marrow signal. Sinuses/Orbits: Unremarkable orbits. Paranasal sinuses and mastoid air cells are clear. Other: None. IMPRESSION: 1. Acute to early subacute right frontal lobe infarcts. 2. 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 Electronically Signed   By: Logan Bores M.D.   On: 05/08/2021 16:58   IR Angiogram Extremity Left  Result Date: 05/12/2021 INDICATION:  Madison Davidson is a 70 y.o. female with a past medical history significant for anemia, DM, HLD, CAD, HTN and prior CVA who presented to [facility] ED via EMS on 6/13 with complaints of weakness and slurred speech the day prior which had since resolved, per her daughter she had not been taking her medications as prescribed. She was noted to be hypertensive at 171/60 mmHg. Her neurologic exam was benign while in bed, however, patient presented left-sided paresthesia with ambulation. 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 CTA head showed evolving recent infarction of the right corona radiata without acute intracranial hemorrhage as well as marked stenoses of the right MCA origin and bifurcation, moderate left M2 MCA branch stenosis, diffusely irregular left vertebral artery with diminished flow and irregular left P2 PCA with moderate to marked stenosis. She has been transferred to [facility] for planned right MCA angioplasty/stent placement. EXAM: FLUOROSCOPY GUIDED VASCULAR ACCESS DIAGNOSTIC CEREBRAL ANGIOGRAM INTRACRANIAL ANGIOPLASTY AND STENTING, RIGHT MCA FLAT PANEL HEAD CT COMPARISON:  CT/CT angiogram of the head May 09, 2021 MEDICATIONS: Refer to anesthesia documentation. ANESTHESIA/SEDATION: The procedure was performed under general anesthesia CONTRAST:  120 mL of Omnipaque 240 milligram/mL FLUOROSCOPY TIME:  Fluoroscopy Time: 75 minutes 6 seconds (1,935 mGy). COMPLICATIONS: None immediate. TECHNIQUE: Informed written consent was obtained from the patient after a thorough discussion of the procedural risks, benefits and alternatives. All questions were addressed. Maximal Sterile Barrier Technique was utilized including caps, mask, sterile gowns, sterile gloves, sterile drape, hand hygiene and skin antiseptic. A timeout was performed prior to the initiation of the procedure. Using the modified Seldinger technique and a  micropuncture kit, access was gained to the distal right radial artery at the anatomical snuffbox and a 6 French sheath was placed. Real-time ultrasound guidance was utilized for vascular access including the acquisition of a permanent ultrasound image documenting patency  of the accessed vessel. Slow intra arterial infusion of 5,000 IU heparin, 5 mg Verapamil and 119 mcg nitroglicerin diluted in patient's own blood was performed. No significant fluctuation in patient's blood pressure seen. Then, a right radial artery roadmap was obtained via sheath side port. Normal brachial artery branching pattern seen. No significant anatomical variation. The right radial artery caliber is adequate for vascular access. Next, a 5 [place] Simmons 2 glide catheter was navigated over a 0.035" Terumo Glidewire into the right subclavian artery under fluoroscopic guidance. Frontal angiogram of the neck was obtained. The catheter was advanced into the aortic arch. The catheter tip was reformed. The catheter was then advanced into the left subclavian artery. Frontal angiogram of the neck was obtained. The catheter was subsequently placed into the left common carotid artery. Frontal and lateral angiograms of the neck were obtained. Under biplane roadmap, the catheter was then advanced into the left internal carotid artery. Frontal and lateral angiograms of the head were obtained. The catheter was again placed into the right subclavian artery. Frontal and lateral angiograms of the head were obtained. The the catheter was then placed clear into the right common carotid artery. Frontal and lateral angiograms of the neck were obtained. Under biplane roadmap, the the catheter was then advanced into the right internal carotid artery. Frontal and lateral angiograms of the head were obtained. FINDINGS: 1. Normal course and caliber of the cervical right vertebral artery. 2. Left subclavian artery angiograms showed no evidence of opacification of the  left vertebral artery, likely related to occlusion. 3. Mild atherosclerotic changes of the left carotid bifurcation without hemodynamically significant stenosis. 4. Severe stenosis of the P2 segment of the left fetal PCA. 5. Mild stenosis of a left M3/MCA posterior division branch. 6. No significant atherosclerotic changes or stenosis of the right carotid bifurcation. 7. Atherosclerotic changes of the right carotid siphons with mild stenosis at the paraclinoid segment. 8. Approximately 50% stenosis at the origin of the right M1/MCA segment. 9. Approximately 50% stenosis at the origin of a right inferior temporal branch. 10. Severe, hairline stenosis of the distal right M1/MCA segment, involving the origin of both anterior and posterior division branches. There is significant delay in opacification of the anterior division branches. PROCEDURE: This into catheter was exchanged over the wire and under biplane roadmap for a benchmark guide catheter which was placed in the distal cervical segment of the right ICA. Magnified frontal and lateral views of the head were obtained in the working projections. Then, attempted navigation of a 1.25 x 10 mm sapphire balloon over a 0.014 inch Aristotle microguidewire proved unsuccessful due to siphon tortuosity. A headway duo microcatheter was navigated over the Aristotle with a wire into the right M2/MCA posterior division branch, distal to the stenosis. The wire was exchanged for a zoom 14 exchange micro guidewire. The microcatheter was then exchanged for the 1.25 x 10 mm sapphire balloon. Angioplasty was performed under fluoroscopy at the distal right M1/MCA. Right internal carotid artery angiograms with frontal lateral views showed in proved flow in the distal right MCA branches. Attempted navigation of a 2 mm resolute onyx balloon mounted stent into the right M1/MCA proved unsuccessful due to siphon tortuosity. Then, a 2 x 8 mm apex balloon was navigated to the level of  stenosis. Additional angioplasty was performed under fluoroscopy. Right internal carotid angiograms with magnified frontal and lateral views showed additional improvement of the degree of stenosis. The headway duo microcatheter was again navigated over the wire into the  right M2/MCA posterior division branch. Subsequently, a 3 x 24 mm neuroform atlas stent was deployed in the distal right M1/MCA segment extending to the proximal M2 posterior division branch. Follow-up angiogram showed adequate stent positioning across the stenosis with significant improvement of the degree of stenosis and anterograde flow in the posterior division branch. Persistent slow flow in the anterior division branch noted. Attempted navigation of the headway duo over the 14 wire through the stent proved unsuccessful. Attempted navigation of the apex balloon also proved unsuccessful. The 2 mm apex balloon was then navigated into the proximal right M1/MCA. Angioplasty was performed under fluoroscopy. Follow-up angiograms showed mild improvement of the proximal right M1/MCA stenosis (now approximately 35%). Further attempts to navigate the balloon for the microcatheter through the previously deployed stent proved unsuccessful. The catheter was subsequently withdrawn. Follow-up angiograms with frontal and lateral views showed stable position and opacification of the stent. Delayed opacification of the anterior division branches noted, although improved from baseline. Flat panel CT of the head was obtained and post processed in a separate workstation with concurrent attending physician supervision. Selected images were sent to PACS. No evidence of hemorrhagic complication. IMPRESSION: 1. Severe intracranial atherosclerotic disease with severe hairline stenosis of the distal right M1/MCA segment extending into the origins of the superior and inferior divisions and severe stenosis of the left P2/PCA. Mild (50%) stenosis of the proximal right M1/MCA  and at the origin of the anterior temporal branch. 2. Angioplasty and stenting performed for treatment of the area of severe stenosis at the distal right M1/MCA segment with improvement of the anterograde flow. Persistent delayed, although improved, flow to the anterior division branch. No further angioplasty or stenting performed due to lack of access through the recently deployed stent. 3. Angioplasty performed at the proximal right M1/MCA segment with approximately 35% residual stenosis. PLAN: Continue on dual antiplatelet therapy for 6 month when stent patency should be evaluated. Electronically Signed   By: Pedro Earls M.D.   On: 05/12/2021 15:22   US Carotid Bilateral (at [facility] and [facility] only)  Result Date: 05/09/2021 CLINICAL DATA:  70 year old female with a history of CVA EXAM: BILATERAL CAROTID DUPLEX ULTRASOUND TECHNIQUE: Pearline Cables scale imaging, color Doppler and duplex ultrasound were performed of bilateral carotid and vertebral arteries in the neck. COMPARISON:  None. FINDINGS: Criteria: Quantification of carotid stenosis is based on velocity parameters that correlate the residual internal carotid diameter with NASCET-based stenosis levels, using the diameter of the distal internal carotid lumen as the denominator for stenosis measurement. The following velocity measurements were obtained: RIGHT ICA:  Systolic 376 cm/sec, Diastolic 19 cm/sec CCA:  283 cm/sec SYSTOLIC ICA/CCA RATIO:  1.5 ECA:  128 cm/sec LEFT ICA:  Systolic 95 cm/sec, Diastolic 11 cm/sec CCA:  84 cm/sec SYSTOLIC ICA/CCA RATIO:  1.1 ECA:  157 cm/sec Right Brachial SBP: Not acquired Left Brachial SBP: Not acquired RIGHT CAROTID ARTERY: No significant calcified disease of the right common carotid artery. Intermediate waveform maintained. Heterogeneous plaque without significant calcifications at the right carotid bifurcation. Low resistance waveform of the right ICA. No significant tortuosity. RIGHT VERTEBRAL ARTERY: Antegrade  flow with low resistance waveform. LEFT CAROTID ARTERY: No significant calcified disease of the left common carotid artery. Intermediate waveform maintained. Heterogeneous plaque at the left carotid bifurcation without significant calcifications. Low resistance waveform of the left ICA. LEFT VERTEBRAL ARTERY: Left vertebral artery not identified. IMPRESSION: Color duplex indicates moderate heterogeneous plaque with no hemodynamically significant stenosis by duplex criteria in the extracranial  cerebrovascular circulation. Left vertebral artery waveform not identified. Signed, Dulcy Fanny. Dellia Nims, RPVI [facility] Radiology Electronically Signed   By: Corrie Mckusick D.O.   On: 05/09/2021 08:19   IR Intra Cran Stent  Result Date: 05/12/2021 INDICATION: ANNIKA SELKE is a 70 y.o. female with a past medical history significant for anemia, DM, HLD, CAD, HTN and prior CVA who presented to [facility] ED via EMS on 6/13 with complaints of weakness and slurred speech the day prior which had since resolved, per her daughter she had not been taking her medications as prescribed. She was noted to be hypertensive at 171/60 mmHg. Her neurologic exam was benign while in bed, however, patient presented left-sided paresthesia with ambulation. 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 CTA head showed evolving recent infarction of the right corona radiata without acute intracranial hemorrhage as well as marked stenoses of the right MCA origin and bifurcation, moderate left M2 MCA branch stenosis, diffusely irregular left vertebral artery with diminished flow and irregular left P2 PCA with moderate to marked stenosis. She has been transferred to [facility] for planned right MCA angioplasty/stent placement. EXAM: FLUOROSCOPY GUIDED VASCULAR ACCESS DIAGNOSTIC CEREBRAL ANGIOGRAM INTRACRANIAL  ANGIOPLASTY AND STENTING, RIGHT MCA FLAT PANEL HEAD CT COMPARISON:  CT/CT angiogram of the head May 09, 2021 MEDICATIONS: Refer to anesthesia documentation. ANESTHESIA/SEDATION: The procedure was performed under general anesthesia CONTRAST:  120 mL of Omnipaque 240 milligram/mL FLUOROSCOPY TIME:  Fluoroscopy Time: 75 minutes 6 seconds (1,935 mGy). COMPLICATIONS: None immediate. TECHNIQUE: Informed written consent was obtained from the patient after a thorough discussion of the procedural risks, benefits and alternatives. All questions were addressed. Maximal Sterile Barrier Technique was utilized including caps, mask, sterile gowns, sterile gloves, sterile drape, hand hygiene and skin antiseptic. A timeout was performed prior to the initiation of the procedure. Using the modified Seldinger technique and a micropuncture kit, access was gained to the distal right radial artery at the anatomical snuffbox and a 6 French sheath was placed. Real-time ultrasound guidance was utilized for vascular access including the acquisition of a permanent ultrasound image documenting patency of the accessed vessel. Slow intra arterial infusion of 5,000 IU heparin, 5 mg Verapamil and 355 mcg nitroglicerin diluted in patient's own blood was performed. No significant fluctuation in patient's blood pressure seen. Then, a right radial artery roadmap was obtained via sheath side port. Normal brachial artery branching pattern seen. No significant anatomical variation. The right radial artery caliber is adequate for vascular access. Next, a 5 [place] Simmons 2 glide catheter was navigated over a 0.035" Terumo Glidewire into the right subclavian artery under fluoroscopic guidance. Frontal angiogram of the neck was obtained. The catheter was advanced into the aortic arch. The catheter tip was reformed. The catheter was then advanced into the left subclavian artery. Frontal angiogram of the neck was obtained. The catheter was subsequently placed  into the left common carotid artery. Frontal and lateral angiograms of the neck were obtained. Under biplane roadmap, the catheter was then advanced into the left internal carotid artery. Frontal and lateral angiograms of the head were obtained. The catheter was again placed into the right subclavian artery. Frontal and lateral angiograms of the head were obtained. The the catheter was then placed clear into the right common carotid artery. Frontal and lateral angiograms of the neck were obtained. Under biplane roadmap, the the catheter was then  advanced into the right internal carotid artery. Frontal and lateral angiograms of the head were obtained. FINDINGS: 1. Normal course and caliber of the cervical right vertebral artery. 2. Left subclavian artery angiograms showed no evidence of opacification of the left vertebral artery, likely related to occlusion. 3. Mild atherosclerotic changes of the left carotid bifurcation without hemodynamically significant stenosis. 4. Severe stenosis of the P2 segment of the left fetal PCA. 5. Mild stenosis of a left M3/MCA posterior division branch. 6. No significant atherosclerotic changes or stenosis of the right carotid bifurcation. 7. Atherosclerotic changes of the right carotid siphons with mild stenosis at the paraclinoid segment. 8. Approximately 50% stenosis at the origin of the right M1/MCA segment. 9. Approximately 50% stenosis at the origin of a right inferior temporal branch. 10. Severe, hairline stenosis of the distal right M1/MCA segment, involving the origin of both anterior and posterior division branches. There is significant delay in opacification of the anterior division branches. PROCEDURE: This into catheter was exchanged over the wire and under biplane roadmap for a benchmark guide catheter which was placed in the distal cervical segment of the right ICA. Magnified frontal and lateral views of the head were obtained in the working projections. Then, attempted  navigation of a 1.25 x 10 mm sapphire balloon over a 0.014 inch Aristotle microguidewire proved unsuccessful due to siphon tortuosity. A headway duo microcatheter was navigated over the Aristotle with a wire into the right M2/MCA posterior division branch, distal to the stenosis. The wire was exchanged for a zoom 14 exchange micro guidewire. The microcatheter was then exchanged for the 1.25 x 10 mm sapphire balloon. Angioplasty was performed under fluoroscopy at the distal right M1/MCA. Right internal carotid artery angiograms with frontal lateral views showed in proved flow in the distal right MCA branches. Attempted navigation of a 2 mm resolute onyx balloon mounted stent into the right M1/MCA proved unsuccessful due to siphon tortuosity. Then, a 2 x 8 mm apex balloon was navigated to the level of stenosis. Additional angioplasty was performed under fluoroscopy. Right internal carotid angiograms with magnified frontal and lateral views showed additional improvement of the degree of stenosis. The headway duo microcatheter was again navigated over the wire into the right M2/MCA posterior division branch. Subsequently, a 3 x 24 mm neuroform atlas stent was deployed in the distal right M1/MCA segment extending to the proximal M2 posterior division branch. Follow-up angiogram showed adequate stent positioning across the stenosis with significant improvement of the degree of stenosis and anterograde flow in the posterior division branch. Persistent slow flow in the anterior division branch noted. Attempted navigation of the headway duo over the 14 wire through the stent proved unsuccessful. Attempted navigation of the apex balloon also proved unsuccessful. The 2 mm apex balloon was then navigated into the proximal right M1/MCA. Angioplasty was performed under fluoroscopy. Follow-up angiograms showed mild improvement of the proximal right M1/MCA stenosis (now approximately 35%). Further attempts to navigate the balloon  for the microcatheter through the previously deployed stent proved unsuccessful. The catheter was subsequently withdrawn. Follow-up angiograms with frontal and lateral views showed stable position and opacification of the stent. Delayed opacification of the anterior division branches noted, although improved from baseline. Flat panel CT of the head was obtained and post processed in a separate workstation with concurrent attending physician supervision. Selected images were sent to PACS. No evidence of hemorrhagic complication. IMPRESSION: 1. Severe intracranial atherosclerotic disease with severe hairline stenosis of the distal right M1/MCA segment extending into the  origins of the superior and inferior divisions and severe stenosis of the left P2/PCA. Mild (50%) stenosis of the proximal right M1/MCA and at the origin of the anterior temporal branch. 2. Angioplasty and stenting performed for treatment of the area of severe stenosis at the distal right M1/MCA segment with improvement of the anterograde flow. Persistent delayed, although improved, flow to the anterior division branch. No further angioplasty or stenting performed due to lack of access through the recently deployed stent. 3. Angioplasty performed at the proximal right M1/MCA segment with approximately 35% residual stenosis. PLAN: Continue on dual antiplatelet therapy for 6 month when stent patency should be evaluated. Electronically Signed   By: Pedro Earls M.D.   On: 05/12/2021 15:22   IR Intra Cran Stent  Result Date: 05/12/2021 INDICATION: MARAYAH HIGDON is a 70 y.o. female with a past medical history significant for anemia, DM, HLD, CAD, HTN and prior CVA who presented to [facility] ED via EMS on 6/13 with complaints of weakness and slurred speech the day prior which had since resolved, per her daughter she had not been taking her medications as prescribed. She was noted to be hypertensive at 171/60 mmHg. Her neurologic exam was  benign while in bed, however, patient presented left-sided paresthesia with ambulation. 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 CTA head showed evolving recent infarction of the right corona radiata without acute intracranial hemorrhage as well as marked stenoses of the right MCA origin and bifurcation, moderate left M2 MCA branch stenosis, diffusely irregular left vertebral artery with diminished flow and irregular left P2 PCA with moderate to marked stenosis. She has been transferred to [facility] for planned right MCA angioplasty/stent placement. EXAM: FLUOROSCOPY GUIDED VASCULAR ACCESS DIAGNOSTIC CEREBRAL ANGIOGRAM INTRACRANIAL ANGIOPLASTY AND STENTING, RIGHT MCA FLAT PANEL HEAD CT COMPARISON:  CT/CT angiogram of the head May 09, 2021 MEDICATIONS: Refer to anesthesia documentation. ANESTHESIA/SEDATION: The procedure was performed under general anesthesia CONTRAST:  120 mL of Omnipaque 240 milligram/mL FLUOROSCOPY TIME:  Fluoroscopy Time: 75 minutes 6 seconds (1,935 mGy). COMPLICATIONS: None immediate. TECHNIQUE: Informed written consent was obtained from the patient after a thorough discussion of the procedural risks, benefits and alternatives. All questions were addressed. Maximal Sterile Barrier Technique was utilized including caps, mask, sterile gowns, sterile gloves, sterile drape, hand hygiene and skin antiseptic. A timeout was performed prior to the initiation of the procedure. Using the modified Seldinger technique and a micropuncture kit, access was gained to the distal right radial artery at the anatomical snuffbox and a 6 French sheath was placed. Real-time ultrasound guidance was utilized for vascular access including the acquisition of a permanent ultrasound image documenting patency of the accessed vessel. Slow intra arterial infusion of 5,000 IU heparin, 5 mg Verapamil and 102 mcg nitroglicerin diluted  in patient's own blood was performed. No significant fluctuation in patient's blood pressure seen. Then, a right radial artery roadmap was obtained via sheath side port. Normal brachial artery branching pattern seen. No significant anatomical variation. The right radial artery caliber is adequate for vascular access. Next, a 5 [place] Simmons 2 glide catheter was navigated over a 0.035" Terumo Glidewire into the right subclavian artery under fluoroscopic guidance. Frontal angiogram of the neck was obtained. The catheter was advanced into the aortic arch. The catheter tip was reformed. The catheter was then advanced into the left subclavian artery. Frontal angiogram of the neck was obtained.  The catheter was subsequently placed into the left common carotid artery. Frontal and lateral angiograms of the neck were obtained. Under biplane roadmap, the catheter was then advanced into the left internal carotid artery. Frontal and lateral angiograms of the head were obtained. The catheter was again placed into the right subclavian artery. Frontal and lateral angiograms of the head were obtained. The the catheter was then placed clear into the right common carotid artery. Frontal and lateral angiograms of the neck were obtained. Under biplane roadmap, the the catheter was then advanced into the right internal carotid artery. Frontal and lateral angiograms of the head were obtained. FINDINGS: 1. Normal course and caliber of the cervical right vertebral artery. 2. Left subclavian artery angiograms showed no evidence of opacification of the left vertebral artery, likely related to occlusion. 3. Mild atherosclerotic changes of the left carotid bifurcation without hemodynamically significant stenosis. 4. Severe stenosis of the P2 segment of the left fetal PCA. 5. Mild stenosis of a left M3/MCA posterior division branch. 6. No significant atherosclerotic changes or stenosis of the right carotid bifurcation. 7. Atherosclerotic  changes of the right carotid siphons with mild stenosis at the paraclinoid segment. 8. Approximately 50% stenosis at the origin of the right M1/MCA segment. 9. Approximately 50% stenosis at the origin of a right inferior temporal branch. 10. Severe, hairline stenosis of the distal right M1/MCA segment, involving the origin of both anterior and posterior division branches. There is significant delay in opacification of the anterior division branches. PROCEDURE: This into catheter was exchanged over the wire and under biplane roadmap for a benchmark guide catheter which was placed in the distal cervical segment of the right ICA. Magnified frontal and lateral views of the head were obtained in the working projections. Then, attempted navigation of a 1.25 x 10 mm sapphire balloon over a 0.014 inch Aristotle microguidewire proved unsuccessful due to siphon tortuosity. A headway duo microcatheter was navigated over the Aristotle with a wire into the right M2/MCA posterior division branch, distal to the stenosis. The wire was exchanged for a zoom 14 exchange micro guidewire. The microcatheter was then exchanged for the 1.25 x 10 mm sapphire balloon. Angioplasty was performed under fluoroscopy at the distal right M1/MCA. Right internal carotid artery angiograms with frontal lateral views showed in proved flow in the distal right MCA branches. Attempted navigation of a 2 mm resolute onyx balloon mounted stent into the right M1/MCA proved unsuccessful due to siphon tortuosity. Then, a 2 x 8 mm apex balloon was navigated to the level of stenosis. Additional angioplasty was performed under fluoroscopy. Right internal carotid angiograms with magnified frontal and lateral views showed additional improvement of the degree of stenosis. The headway duo microcatheter was again navigated over the wire into the right M2/MCA posterior division branch. Subsequently, a 3 x 24 mm neuroform atlas stent was deployed in the distal right  M1/MCA segment extending to the proximal M2 posterior division branch. Follow-up angiogram showed adequate stent positioning across the stenosis with significant improvement of the degree of stenosis and anterograde flow in the posterior division branch. Persistent slow flow in the anterior division branch noted. Attempted navigation of the headway duo over the 14 wire through the stent proved unsuccessful. Attempted navigation of the apex balloon also proved unsuccessful. The 2 mm apex balloon was then navigated into the proximal right M1/MCA. Angioplasty was performed under fluoroscopy. Follow-up angiograms showed mild improvement of the proximal right M1/MCA stenosis (now approximately 35%). Further attempts to navigate the balloon  for the microcatheter through the previously deployed stent proved unsuccessful. The catheter was subsequently withdrawn. Follow-up angiograms with frontal and lateral views showed stable position and opacification of the stent. Delayed opacification of the anterior division branches noted, although improved from baseline. Flat panel CT of the head was obtained and post processed in a separate workstation with concurrent attending physician supervision. Selected images were sent to PACS. No evidence of hemorrhagic complication. IMPRESSION: 1. Severe intracranial atherosclerotic disease with severe hairline stenosis of the distal right M1/MCA segment extending into the origins of the superior and inferior divisions and severe stenosis of the left P2/PCA. Mild (50%) stenosis of the proximal right M1/MCA and at the origin of the anterior temporal branch. 2. Angioplasty and stenting performed for treatment of the area of severe stenosis at the distal right M1/MCA segment with improvement of the anterograde flow. Persistent delayed, although improved, flow to the anterior division branch. No further angioplasty or stenting performed due to lack of access through the recently deployed stent.  3. Angioplasty performed at the proximal right M1/MCA segment with approximately 35% residual stenosis. PLAN: Continue on dual antiplatelet therapy for 6 month when stent patency should be evaluated. Electronically Signed   By: Pedro Earls M.D.   On: 05/12/2021 15:22   IR CT Head Ltd  Result Date: 05/12/2021 INDICATION: Madison Davidson is a 70 y.o. female with a past medical history significant for anemia, DM, HLD, CAD, HTN and prior CVA who presented to [facility] ED via EMS on 6/13 with complaints of weakness and slurred speech the day prior which had since resolved, per her daughter she had not been taking her medications as prescribed. She was noted to be hypertensive at 171/60 mmHg. Her neurologic exam was benign while in bed, however, patient presented left-sided paresthesia with ambulation. 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 CTA head showed evolving recent infarction of the right corona radiata without acute intracranial hemorrhage as well as marked stenoses of the right MCA origin and bifurcation, moderate left M2 MCA branch stenosis, diffusely irregular left vertebral artery with diminished flow and irregular left P2 PCA with moderate to marked stenosis. She has been transferred to [facility] for planned right MCA angioplasty/stent placement. EXAM: FLUOROSCOPY GUIDED VASCULAR ACCESS DIAGNOSTIC CEREBRAL ANGIOGRAM INTRACRANIAL ANGIOPLASTY AND STENTING, RIGHT MCA FLAT PANEL HEAD CT COMPARISON:  CT/CT angiogram of the head May 09, 2021 MEDICATIONS: Refer to anesthesia documentation. ANESTHESIA/SEDATION: The procedure was performed under general anesthesia CONTRAST:  120 mL of Omnipaque 240 milligram/mL FLUOROSCOPY TIME:  Fluoroscopy Time: 75 minutes 6 seconds (1,935 mGy). COMPLICATIONS: None immediate. TECHNIQUE: Informed written consent was obtained from the patient after a thorough discussion  of the procedural risks, benefits and alternatives. All questions were addressed. Maximal Sterile Barrier Technique was utilized including caps, mask, sterile gowns, sterile gloves, sterile drape, hand hygiene and skin antiseptic. A timeout was performed prior to the initiation of the procedure. Using the modified Seldinger technique and a micropuncture kit, access was gained to the distal right radial artery at the anatomical snuffbox and a 6 French sheath was placed. Real-time ultrasound guidance was utilized for vascular access including the acquisition of a permanent ultrasound image documenting patency of the accessed vessel. Slow intra arterial infusion of 5,000 IU heparin, 5 mg Verapamil and 884 mcg nitroglicerin diluted in patient's own blood was performed. No significant fluctuation in patient's blood pressure seen.  Then, a right radial artery roadmap was obtained via sheath side port. Normal brachial artery branching pattern seen. No significant anatomical variation. The right radial artery caliber is adequate for vascular access. Next, a 5 [place] Simmons 2 glide catheter was navigated over a 0.035" Terumo Glidewire into the right subclavian artery under fluoroscopic guidance. Frontal angiogram of the neck was obtained. The catheter was advanced into the aortic arch. The catheter tip was reformed. The catheter was then advanced into the left subclavian artery. Frontal angiogram of the neck was obtained. The catheter was subsequently placed into the left common carotid artery. Frontal and lateral angiograms of the neck were obtained. Under biplane roadmap, the catheter was then advanced into the left internal carotid artery. Frontal and lateral angiograms of the head were obtained. The catheter was again placed into the right subclavian artery. Frontal and lateral angiograms of the head were obtained. The the catheter was then placed clear into the right common carotid artery. Frontal and lateral angiograms  of the neck were obtained. Under biplane roadmap, the the catheter was then advanced into the right internal carotid artery. Frontal and lateral angiograms of the head were obtained. FINDINGS: 1. Normal course and caliber of the cervical right vertebral artery. 2. Left subclavian artery angiograms showed no evidence of opacification of the left vertebral artery, likely related to occlusion. 3. Mild atherosclerotic changes of the left carotid bifurcation without hemodynamically significant stenosis. 4. Severe stenosis of the P2 segment of the left fetal PCA. 5. Mild stenosis of a left M3/MCA posterior division branch. 6. No significant atherosclerotic changes or stenosis of the right carotid bifurcation. 7. Atherosclerotic changes of the right carotid siphons with mild stenosis at the paraclinoid segment. 8. Approximately 50% stenosis at the origin of the right M1/MCA segment. 9. Approximately 50% stenosis at the origin of a right inferior temporal branch. 10. Severe, hairline stenosis of the distal right M1/MCA segment, involving the origin of both anterior and posterior division branches. There is significant delay in opacification of the anterior division branches. PROCEDURE: This into catheter was exchanged over the wire and under biplane roadmap for a benchmark guide catheter which was placed in the distal cervical segment of the right ICA. Magnified frontal and lateral views of the head were obtained in the working projections. Then, attempted navigation of a 1.25 x 10 mm sapphire balloon over a 0.014 inch Aristotle microguidewire proved unsuccessful due to siphon tortuosity. A headway duo microcatheter was navigated over the Aristotle with a wire into the right M2/MCA posterior division branch, distal to the stenosis. The wire was exchanged for a zoom 14 exchange micro guidewire. The microcatheter was then exchanged for the 1.25 x 10 mm sapphire balloon. Angioplasty was performed under fluoroscopy at the  distal right M1/MCA. Right internal carotid artery angiograms with frontal lateral views showed in proved flow in the distal right MCA branches. Attempted navigation of a 2 mm resolute onyx balloon mounted stent into the right M1/MCA proved unsuccessful due to siphon tortuosity. Then, a 2 x 8 mm apex balloon was navigated to the level of stenosis. Additional angioplasty was performed under fluoroscopy. Right internal carotid angiograms with magnified frontal and lateral views showed additional improvement of the degree of stenosis. The headway duo microcatheter was again navigated over the wire into the right M2/MCA posterior division branch. Subsequently, a 3 x 24 mm neuroform atlas stent was deployed in the distal right M1/MCA segment extending to the proximal M2 posterior division branch. Follow-up angiogram showed adequate  stent positioning across the stenosis with significant improvement of the degree of stenosis and anterograde flow in the posterior division branch. Persistent slow flow in the anterior division branch noted. Attempted navigation of the headway duo over the 14 wire through the stent proved unsuccessful. Attempted navigation of the apex balloon also proved unsuccessful. The 2 mm apex balloon was then navigated into the proximal right M1/MCA. Angioplasty was performed under fluoroscopy. Follow-up angiograms showed mild improvement of the proximal right M1/MCA stenosis (now approximately 35%). Further attempts to navigate the balloon for the microcatheter through the previously deployed stent proved unsuccessful. The catheter was subsequently withdrawn. Follow-up angiograms with frontal and lateral views showed stable position and opacification of the stent. Delayed opacification of the anterior division branches noted, although improved from baseline. Flat panel CT of the head was obtained and post processed in a separate workstation with concurrent attending physician supervision. Selected  images were sent to PACS. No evidence of hemorrhagic complication. IMPRESSION: 1. Severe intracranial atherosclerotic disease with severe hairline stenosis of the distal right M1/MCA segment extending into the origins of the superior and inferior divisions and severe stenosis of the left P2/PCA. Mild (50%) stenosis of the proximal right M1/MCA and at the origin of the anterior temporal branch. 2. Angioplasty and stenting performed for treatment of the area of severe stenosis at the distal right M1/MCA segment with improvement of the anterograde flow. Persistent delayed, although improved, flow to the anterior division branch. No further angioplasty or stenting performed due to lack of access through the recently deployed stent. 3. Angioplasty performed at the proximal right M1/MCA segment with approximately 35% residual stenosis. PLAN: Continue on dual antiplatelet therapy for 6 month when stent patency should be evaluated. Electronically Signed   By: Pedro Earls M.D.   On: 05/12/2021 15:22   IR US Guide Vasc Access Right  Result Date: 05/12/2021 INDICATION: Madison Davidson is a 70 y.o. female with a past medical history significant for anemia, DM, HLD, CAD, HTN and prior CVA who presented to [facility] ED via EMS on 6/13 with complaints of weakness and slurred speech the day prior which had since resolved, per her daughter she had not been taking her medications as prescribed. She was noted to be hypertensive at 171/60 mmHg. Her neurologic exam was benign while in bed, however, patient presented left-sided paresthesia with ambulation. 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 CTA head showed evolving recent infarction of the right corona radiata without acute intracranial hemorrhage as well as marked stenoses of the right MCA origin and bifurcation, moderate left M2 MCA branch stenosis, diffusely  irregular left vertebral artery with diminished flow and irregular left P2 PCA with moderate to marked stenosis. She has been transferred to [facility] for planned right MCA angioplasty/stent placement. EXAM: FLUOROSCOPY GUIDED VASCULAR ACCESS DIAGNOSTIC CEREBRAL ANGIOGRAM INTRACRANIAL ANGIOPLASTY AND STENTING, RIGHT MCA FLAT PANEL HEAD CT COMPARISON:  CT/CT angiogram of the head May 09, 2021 MEDICATIONS: Refer to anesthesia documentation. ANESTHESIA/SEDATION: The procedure was performed under general anesthesia CONTRAST:  120 mL of Omnipaque 240 milligram/mL FLUOROSCOPY TIME:  Fluoroscopy Time: 75 minutes 6 seconds (1,935 mGy). COMPLICATIONS: None immediate. TECHNIQUE: Informed written consent was obtained from the patient after a thorough discussion of the procedural risks, benefits and alternatives. All questions were addressed. Maximal Sterile Barrier Technique was utilized including caps, mask, sterile gowns, sterile gloves, sterile drape, hand hygiene and  skin antiseptic. A timeout was performed prior to the initiation of the procedure. Using the modified Seldinger technique and a micropuncture kit, access was gained to the distal right radial artery at the anatomical snuffbox and a 6 French sheath was placed. Real-time ultrasound guidance was utilized for vascular access including the acquisition of a permanent ultrasound image documenting patency of the accessed vessel. Slow intra arterial infusion of 5,000 IU heparin, 5 mg Verapamil and 034 mcg nitroglicerin diluted in patient's own blood was performed. No significant fluctuation in patient's blood pressure seen. Then, a right radial artery roadmap was obtained via sheath side port. Normal brachial artery branching pattern seen. No significant anatomical variation. The right radial artery caliber is adequate for vascular access. Next, a 5 [place] Simmons 2 glide catheter was navigated over a 0.035" Terumo Glidewire into the right subclavian artery under  fluoroscopic guidance. Frontal angiogram of the neck was obtained. The catheter was advanced into the aortic arch. The catheter tip was reformed. The catheter was then advanced into the left subclavian artery. Frontal angiogram of the neck was obtained. The catheter was subsequently placed into the left common carotid artery. Frontal and lateral angiograms of the neck were obtained. Under biplane roadmap, the catheter was then advanced into the left internal carotid artery. Frontal and lateral angiograms of the head were obtained. The catheter was again placed into the right subclavian artery. Frontal and lateral angiograms of the head were obtained. The the catheter was then placed clear into the right common carotid artery. Frontal and lateral angiograms of the neck were obtained. Under biplane roadmap, the the catheter was then advanced into the right internal carotid artery. Frontal and lateral angiograms of the head were obtained. FINDINGS: 1. Normal course and caliber of the cervical right vertebral artery. 2. Left subclavian artery angiograms showed no evidence of opacification of the left vertebral artery, likely related to occlusion. 3. Mild atherosclerotic changes of the left carotid bifurcation without hemodynamically significant stenosis. 4. Severe stenosis of the P2 segment of the left fetal PCA. 5. Mild stenosis of a left M3/MCA posterior division branch. 6. No significant atherosclerotic changes or stenosis of the right carotid bifurcation. 7. Atherosclerotic changes of the right carotid siphons with mild stenosis at the paraclinoid segment. 8. Approximately 50% stenosis at the origin of the right M1/MCA segment. 9. Approximately 50% stenosis at the origin of a right inferior temporal branch. 10. Severe, hairline stenosis of the distal right M1/MCA segment, involving the origin of both anterior and posterior division branches. There is significant delay in opacification of the anterior division  branches. PROCEDURE: This into catheter was exchanged over the wire and under biplane roadmap for a benchmark guide catheter which was placed in the distal cervical segment of the right ICA. Magnified frontal and lateral views of the head were obtained in the working projections. Then, attempted navigation of a 1.25 x 10 mm sapphire balloon over a 0.014 inch Aristotle microguidewire proved unsuccessful due to siphon tortuosity. A headway duo microcatheter was navigated over the Aristotle with a wire into the right M2/MCA posterior division branch, distal to the stenosis. The wire was exchanged for a zoom 14 exchange micro guidewire. The microcatheter was then exchanged for the 1.25 x 10 mm sapphire balloon. Angioplasty was performed under fluoroscopy at the distal right M1/MCA. Right internal carotid artery angiograms with frontal lateral views showed in proved flow in the distal right MCA branches. Attempted navigation of a 2 mm resolute onyx balloon mounted stent into  the right M1/MCA proved unsuccessful due to siphon tortuosity. Then, a 2 x 8 mm apex balloon was navigated to the level of stenosis. Additional angioplasty was performed under fluoroscopy. Right internal carotid angiograms with magnified frontal and lateral views showed additional improvement of the degree of stenosis. The headway duo microcatheter was again navigated over the wire into the right M2/MCA posterior division branch. Subsequently, a 3 x 24 mm neuroform atlas stent was deployed in the distal right M1/MCA segment extending to the proximal M2 posterior division branch. Follow-up angiogram showed adequate stent positioning across the stenosis with significant improvement of the degree of stenosis and anterograde flow in the posterior division branch. Persistent slow flow in the anterior division branch noted. Attempted navigation of the headway duo over the 14 wire through the stent proved unsuccessful. Attempted navigation of the apex  balloon also proved unsuccessful. The 2 mm apex balloon was then navigated into the proximal right M1/MCA. Angioplasty was performed under fluoroscopy. Follow-up angiograms showed mild improvement of the proximal right M1/MCA stenosis (now approximately 35%). Further attempts to navigate the balloon for the microcatheter through the previously deployed stent proved unsuccessful. The catheter was subsequently withdrawn. Follow-up angiograms with frontal and lateral views showed stable position and opacification of the stent. Delayed opacification of the anterior division branches noted, although improved from baseline. Flat panel CT of the head was obtained and post processed in a separate workstation with concurrent attending physician supervision. Selected images were sent to PACS. No evidence of hemorrhagic complication. IMPRESSION: 1. Severe intracranial atherosclerotic disease with severe hairline stenosis of the distal right M1/MCA segment extending into the origins of the superior and inferior divisions and severe stenosis of the left P2/PCA. Mild (50%) stenosis of the proximal right M1/MCA and at the origin of the anterior temporal branch. 2. Angioplasty and stenting performed for treatment of the area of severe stenosis at the distal right M1/MCA segment with improvement of the anterograde flow. Persistent delayed, although improved, flow to the anterior division branch. No further angioplasty or stenting performed due to lack of access through the recently deployed stent. 3. Angioplasty performed at the proximal right M1/MCA segment with approximately 35% residual stenosis. PLAN: Continue on dual antiplatelet therapy for 6 month when stent patency should be evaluated. Electronically Signed   By: Pedro Earls M.D.   On: 05/12/2021 15:22   DG Knee Complete 4 Views Left  Result Date: 05/08/2021 CLINICAL DATA:  Recent fall with left knee pain, initial encounter EXAM: LEFT KNEE - COMPLETE 4+  VIEW COMPARISON:  None. FINDINGS: No evidence of fracture, dislocation, or joint effusion. No evidence of arthropathy or other focal bone abnormality. Soft tissues are unremarkable. IMPRESSION: No acute abnormality noted. Electronically Signed   By: Inez Catalina M.D.   On: 05/08/2021 15:32   ECHOCARDIOGRAM COMPLETE  Result Date: 05/09/2021    ECHOCARDIOGRAM REPORT   Patient Name:   Madison Davidson Coad Date of Exam: 05/09/2021 Medical Rec #:  [id number]        Height:       62.0 in Accession #:    [id number]       Weight:       175.5 lb Date of Birth:  10-25-1951       BSA:          1.808 m Patient Age:    49 years         BP:           179/73 mmHg  Patient Gender: F                HR:           73 bpm. Exam Location:  [facility] Procedure: 2D Echo, Cardiac Doppler, Color Doppler and Saline Contrast Bubble            Study Indications:     Stroke I63.9  History:         Patient has no prior history of Echocardiogram examinations.                  Stroke; Risk Factors:Diabetes.  Sonographer:     Sherrie Sport RDCS (AE) Referring Phys:  [place] Diagnosing Phys: Ida Rogue MD IMPRESSIONS  1. Left ventricular ejection fraction, by estimation, is 60 to 65%. The left ventricle has normal function. The left ventricle has no regional wall motion abnormalities. Left ventricular diastolic parameters are indeterminate.  2. Right ventricular systolic function is normal. The right ventricular size is normal. FINDINGS  Left Ventricle: Left ventricular ejection fraction, by estimation, is 60 to 65%. The left ventricle has normal function. The left ventricle has no regional wall motion abnormalities. The left ventricular internal cavity size was normal in size. There is  no left ventricular hypertrophy. Left ventricular diastolic parameters are indeterminate. Right Ventricle: The right ventricular size is normal. No increase in right ventricular wall thickness. Right ventricular systolic function is normal. Left Atrium: Left  atrial size was normal in size. Right Atrium: Right atrial size was normal in size. Pericardium: There is no evidence of pericardial effusion. Mitral Valve: The mitral valve was not well visualized. No evidence of mitral valve regurgitation. No evidence of mitral valve stenosis. Tricuspid Valve: The tricuspid valve is not well visualized. Tricuspid valve regurgitation is not demonstrated. No evidence of tricuspid stenosis. Aortic Valve: The aortic valve was not well visualized. Aortic valve regurgitation is not visualized. No aortic stenosis is present. Pulmonic Valve: The pulmonic valve was not well visualized. Pulmonic valve regurgitation is not visualized. No evidence of pulmonic stenosis. Aorta: The aortic root is normal in size and structure. Venous: The inferior vena cava is normal in size with greater than 50% respiratory variability, suggesting right atrial pressure of 3 mmHg. IAS/Shunts: No atrial level shunt detected by color flow Doppler. Agitated saline contrast was given intravenously to evaluate for intracardiac shunting.  LEFT VENTRICLE PLAX 2D LVIDd:         3.70 cm LVIDs:         2.30 cm LV PW:         1.20 cm LV IVS:        1.30 cm LVOT diam:     2.10 cm LVOT Area:     3.46 cm  LEFT ATRIUM         Index LA diam:    3.20 cm 1.77 cm/m                        PULMONIC VALVE AORTA                 PV Vmax:        0.99 m/s Ao Root diam: 3.20 cm PV Peak grad:   3.9 mmHg                       RVOT Peak grad: 5 mmHg   SHUNTS Systemic Diam: 2.10 cm Ida Rogue MD Electronically signed by Christia Reading  Gollan MD Signature Date/Time: 05/09/2021/5:06:01 PM    Final    IR ANGIO VERTEBRAL SEL VERTEBRAL UNI R MOD SED  Result Date: 05/12/2021 INDICATION: VALJEAN RUPPEL is a 69 y.o. female with a past medical history significant for anemia, DM, HLD, CAD, HTN and prior CVA who presented to [facility] ED via EMS on 6/13 with complaints of weakness and slurred speech the day prior which had since resolved, per her  daughter she had not been taking her medications as prescribed. She was noted to be hypertensive at 171/60 mmHg. Her neurologic exam was benign while in bed, however, patient presented left-sided paresthesia with ambulation. 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 CTA head showed evolving recent infarction of the right corona radiata without acute intracranial hemorrhage as well as marked stenoses of the right MCA origin and bifurcation, moderate left M2 MCA branch stenosis, diffusely irregular left vertebral artery with diminished flow and irregular left P2 PCA with moderate to marked stenosis. She has been transferred to [facility] for planned right MCA angioplasty/stent placement. EXAM: FLUOROSCOPY GUIDED VASCULAR ACCESS DIAGNOSTIC CEREBRAL ANGIOGRAM INTRACRANIAL ANGIOPLASTY AND STENTING, RIGHT MCA FLAT PANEL HEAD CT COMPARISON:  CT/CT angiogram of the head May 09, 2021 MEDICATIONS: Refer to anesthesia documentation. ANESTHESIA/SEDATION: The procedure was performed under general anesthesia CONTRAST:  120 mL of Omnipaque 240 milligram/mL FLUOROSCOPY TIME:  Fluoroscopy Time: 75 minutes 6 seconds (1,935 mGy). COMPLICATIONS: None immediate. TECHNIQUE: Informed written consent was obtained from the patient after a thorough discussion of the procedural risks, benefits and alternatives. All questions were addressed. Maximal Sterile Barrier Technique was utilized including caps, mask, sterile gowns, sterile gloves, sterile drape, hand hygiene and skin antiseptic. A timeout was performed prior to the initiation of the procedure. Using the modified Seldinger technique and a micropuncture kit, access was gained to the distal right radial artery at the anatomical snuffbox and a 6 French sheath was placed. Real-time ultrasound guidance was utilized for vascular access including the acquisition of a permanent ultrasound image  documenting patency of the accessed vessel. Slow intra arterial infusion of 5,000 IU heparin, 5 mg Verapamil and 588 mcg nitroglicerin diluted in patient's own blood was performed. No significant fluctuation in patient's blood pressure seen. Then, a right radial artery roadmap was obtained via sheath side port. Normal brachial artery branching pattern seen. No significant anatomical variation. The right radial artery caliber is adequate for vascular access. Next, a 5 [place] Simmons 2 glide catheter was navigated over a 0.035" Terumo Glidewire into the right subclavian artery under fluoroscopic guidance. Frontal angiogram of the neck was obtained. The catheter was advanced into the aortic arch. The catheter tip was reformed. The catheter was then advanced into the left subclavian artery. Frontal angiogram of the neck was obtained. The catheter was subsequently placed into the left common carotid artery. Frontal and lateral angiograms of the neck were obtained. Under biplane roadmap, the catheter was then advanced into the left internal carotid artery. Frontal and lateral angiograms of the head were obtained. The catheter was again placed into the right subclavian artery. Frontal and lateral angiograms of the head were obtained. The the catheter was then placed clear into the right common carotid artery. Frontal and lateral angiograms of the neck were obtained. Under biplane roadmap, the the catheter was then advanced into the right internal carotid artery. Frontal and lateral angiograms of the head were obtained. FINDINGS: 1. Normal  course and caliber of the cervical right vertebral artery. 2. Left subclavian artery angiograms showed no evidence of opacification of the left vertebral artery, likely related to occlusion. 3. Mild atherosclerotic changes of the left carotid bifurcation without hemodynamically significant stenosis. 4. Severe stenosis of the P2 segment of the left fetal PCA. 5. Mild stenosis of a left  M3/MCA posterior division branch. 6. No significant atherosclerotic changes or stenosis of the right carotid bifurcation. 7. Atherosclerotic changes of the right carotid siphons with mild stenosis at the paraclinoid segment. 8. Approximately 50% stenosis at the origin of the right M1/MCA segment. 9. Approximately 50% stenosis at the origin of a right inferior temporal branch. 10. Severe, hairline stenosis of the distal right M1/MCA segment, involving the origin of both anterior and posterior division branches. There is significant delay in opacification of the anterior division branches. PROCEDURE: This into catheter was exchanged over the wire and under biplane roadmap for a benchmark guide catheter which was placed in the distal cervical segment of the right ICA. Magnified frontal and lateral views of the head were obtained in the working projections. Then, attempted navigation of a 1.25 x 10 mm sapphire balloon over a 0.014 inch Aristotle microguidewire proved unsuccessful due to siphon tortuosity. A headway duo microcatheter was navigated over the Aristotle with a wire into the right M2/MCA posterior division branch, distal to the stenosis. The wire was exchanged for a zoom 14 exchange micro guidewire. The microcatheter was then exchanged for the 1.25 x 10 mm sapphire balloon. Angioplasty was performed under fluoroscopy at the distal right M1/MCA. Right internal carotid artery angiograms with frontal lateral views showed in proved flow in the distal right MCA branches. Attempted navigation of a 2 mm resolute onyx balloon mounted stent into the right M1/MCA proved unsuccessful due to siphon tortuosity. Then, a 2 x 8 mm apex balloon was navigated to the level of stenosis. Additional angioplasty was performed under fluoroscopy. Right internal carotid angiograms with magnified frontal and lateral views showed additional improvement of the degree of stenosis. The headway duo microcatheter was again navigated over the  wire into the right M2/MCA posterior division branch. Subsequently, a 3 x 24 mm neuroform atlas stent was deployed in the distal right M1/MCA segment extending to the proximal M2 posterior division branch. Follow-up angiogram showed adequate stent positioning across the stenosis with significant improvement of the degree of stenosis and anterograde flow in the posterior division branch. Persistent slow flow in the anterior division branch noted. Attempted navigation of the headway duo over the 14 wire through the stent proved unsuccessful. Attempted navigation of the apex balloon also proved unsuccessful. The 2 mm apex balloon was then navigated into the proximal right M1/MCA. Angioplasty was performed under fluoroscopy. Follow-up angiograms showed mild improvement of the proximal right M1/MCA stenosis (now approximately 35%). Further attempts to navigate the balloon for the microcatheter through the previously deployed stent proved unsuccessful. The catheter was subsequently withdrawn. Follow-up angiograms with frontal and lateral views showed stable position and opacification of the stent. Delayed opacification of the anterior division branches noted, although improved from baseline. Flat panel CT of the head was obtained and post processed in a separate workstation with concurrent attending physician supervision. Selected images were sent to PACS. No evidence of hemorrhagic complication. IMPRESSION: 1. Severe intracranial atherosclerotic disease with severe hairline stenosis of the distal right M1/MCA segment extending into the origins of the superior and inferior divisions and severe stenosis of the left P2/PCA. Mild (50%) stenosis of the  proximal right M1/MCA and at the origin of the anterior temporal branch. 2. Angioplasty and stenting performed for treatment of the area of severe stenosis at the distal right M1/MCA segment with improvement of the anterograde flow. Persistent delayed, although improved, flow  to the anterior division branch. No further angioplasty or stenting performed due to lack of access through the recently deployed stent. 3. Angioplasty performed at the proximal right M1/MCA segment with approximately 35% residual stenosis. PLAN: Continue on dual antiplatelet therapy for 6 month when stent patency should be evaluated. Electronically Signed   By: Pedro Earls M.D.   On: 05/12/2021 15:22    Labs:  Basic Metabolic Panel: No results for input(s): NA, K, CL, CO2, GLUCOSE, BUN, CREATININE, CALCIUM, MG, PHOS in the last 168 hours.  CBC: No results for input(s): WBC, NEUTROABS, HGB, HCT, MCV, PLT in the last 168 hours.  CBG: Recent Labs  Lab 05/24/21 0612 05/24/21 0904 05/24/21 1205 05/24/21 1701 05/24/21 2102  GLUCAP 99 193* 108* 101* 134*   Family history.  Mother and father with CAD.  Denies any colon cancer esophageal cancer or rectal cancer  Brief HPI:   Madison Davidson is a 70 y.o. right-handed female with history of diabetes mellitus hypertension hyperlipidemia hypothyroidism obesity with BMI 32.90 CAD as well as prior CVA maintained on aspirin.  Per chart review patient lives alone independent prior to admission.  She has family in the area with good support.  Presented [facility] 05/08/2021 after fall with brief loss of consciousness transient left-sided weakness and slurred speech.  Cranial CT scan showed ill-defined hypodensities right frontal white matter compatible with ischemia of indeterminate age.  Possibly subacute.  No acute hemorrhage.  MRI of the brain acute to early subacute right frontal lobe infarction.  Chronic left basal ganglia left occipital infarcts.  Patient did not receive tPA.  Carotid Dopplers with no ICA stenosis.  CT angiogram of head and neck Marked stenosis of the right MCA origin and bifurcation.  Moderate left M2 MCA branch stenosis.  Patient was transferred to [facility] underwent right distal M1 stenting per interventional  radiology.  Maintained on aspirin and Plavix x3 months and aspirin alone given multifocal intracranial stenosis.  Echocardiogram with ejection fraction of 60 to 65% no wall motion abnormalities.  Subcutaneous Lovenox for DVT prophylaxis.  Dysphagia #2 thin liquids.  Due to patient decreased functional ability left side weakness was admitted for a comprehensive rehab program   Hospital Course: JOSI ROEDIGER was admitted to rehab 05/15/2021 for inpatient therapies to consist of PT, ST and OT at least three hours five days a week. Past admission physiatrist, therapy team and rehab RN have worked together to provide customized collaborative inpatient rehab.  Pertaining to patient's right corona radiata infarct likely secondary to large vessel disease source with 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  Patient would follow-up with neurology services.  Continue aspirin and Plavix x3 months then aspirin alone.  No bleeding episodes.  Mood stabilization with the addition of Celexa and emotional support provided.  Initially on Lovenox for DVT prophylaxis discontinued his mobility improved.  Lipitor for hyperlipidemia.  Tolerating a dysphagia #2 thin liquid diet.  History of diabetes mellitus hemoglobin A1c 7.1 Glucotrol and Glucophage as advised with diabetic teaching follow-up outpatient.  Hormone supplement ongoing for hypothyroidism.  Blood pressure controlled on lisinopril.  Noted vitamin D deficiency maintained on supplement.  Patient did have hospital course gram-negative rods 60,000 UTI completing course  of antibiotics no dysuria or hematuria.  Right big toe onchomycosis with routine skin care ambulatory referral obtained with podiatry services.  Patient did have episode of nausea vomiting x2 with constipation resolved with laxative assistance.  KUB unremarkable.  Mild hyperkalemia 5.6 patient refused Kayexalate follow-up chemistries showed a potassium 4.5 monitoring of creatinine 1.43.  She did  receive overnight IV fluids for hydration and creatinine improved to 0.96-0.76 and hemoglobin stable at 11.3 and WBC 9400.   Blood pressures were monitored on TID basis and soft and monitored  Diabetes has been monitored with ac/hs CBG checks and SSI was use prn for tighter BS control.    Rehab course: During patient's stay in rehab weekly team conferences were held to monitor patient's progress, set goals and discuss barriers to discharge. At admission, patient required min mod assist ambulation minimal assist sit to stand  Physical exam.  Blood pressure 128/70 pulse 80 temperature 98 respirations 18 oxygen saturation 92% room air Constitutional.  No acute distress HEENT Head.  Normocephalic and atraumatic Eyes.  Pupils round and reactive to light no discharge without nystagmus Neck.  Supple nontender no JVD without thyromegaly Cardiac regular rate rhythm not any extra sounds or murmur heard Abdomen.  Soft nontender positive bowel sounds without rebound Respiratory effort normal no respiratory distress without wheeze Skin.  Warm and dry Neurologic.  Alert oriented makes eye contact with examiner speech is mildly dysarthric but intelligible.  She follows commands.  Fair insight and awareness.  4/5 strength throughout.  He/She  has had improvement in activity tolerance, balance, postural control as well as ability to compensate for deficits. He/She has had improvement in functional use RUE/LUE  and RLE/LLE as well as improvement in awareness.  Patient needs some assist for donning right sock and shoe.  Supine to sit completed supervision.  Ambulates 165 feet supervision rolling walker.  Stand pivot transfers with supervision.  She does need some cues at times for safety.  Completed stand pivot wheelchair to edge of bed for ADLs and sit to supine with contact-guard assist.  She did need some assistance for PERI care.  Speech therapy follow-up for dysphagia and restrictions.  Full family teaching  completed plan discharged to home       Disposition: Discharged to home    Diet: Dysphagia #2 thin liquids  Special Instructions: No driving smoking or alcohol  Continue aspirin 81 mg daily and Plavix 75 mg day x3 months total then aspirin alone  Medications at discharge 1.  Tylenol as needed 2.  RisaQuad 2 capsules 3 times daily 3.  Aspirin 81 mg p.o. daily 4.  Lipitor 80 mg p.o. nightly 5.  Celexa 10 mg p.o. daily 6.  Plavix 75 mg p.o. daily 7.  Glucotrol 5 mg p.o. twice daily 8.  Synthroid 125 mcg p.o. daily 9.  Lisinopril 20 mg p.o. daily 10.  Glucophage 1000 mg p.o. twice daily with meals 11.  Micatin topical twice daily to right big toe 12.  FiberCon 625 mg p.o. twice daily 13.  Vitamin D 50,000 units every 7 days  30-35 minutes were spent completing discharge summary and discharge planning  Discharge Instructions     Ambulatory referral to Neurology   Complete by: As directed    An appointment is requested in approximately: 4 weeks right corona radiata/S0 infarct secondary to large vessel disease   Ambulatory referral to Physical Medicine Rehab   Complete by: As directed    Moderate complexity follow-up 1 to 2 weeks  right corona radiata/S0 infarct due to large vessel disease   Ambulatory referral to Podiatry   Complete by: As directed    Left foot ingrown toenail great toe        Follow-up Information     Raulkar, Clide Deutscher, MD Follow up.   Specialty: Physical Medicine and Rehabilitation Why: 08/17/21 please arrive in office at 9:00am for 9:20am appt Contact information: [address] [place] [address] [phone]         Pedro Earls, MD Follow up.   Specialties: Radiology, Interventional Radiology Why: Call for appointment Contact information: [place] [address] [phone]         [place] Follow up.   Specialty: General Practice Contact  information: [place] [address] [phone]                 Signed: Lavon Paganini Dorado 05/25/2021, 5:37 AM

## 2021-05-23 NOTE — Progress Notes (Signed)
Occupational Therapy Session Note  Patient Details  Name: Madison Davidson MRN: [id number] Date of Birth: [birth year]  Today's Date: 05/23/2021 OT Individual Time: 1330-1430 OT Individual Time Calculation (min): 60 min    Short Term Goals: Week 1:  OT Short Term Goal 1 (Week 1): STGs = LTGs   Skilled Therapeutic Interventions/Progress Updates:    Pt semi upright in bed with two daughters present for caregiver education session.  Educated pt and family regarding pts current functional level and need for 24 hour supervision upon discharge to home setting to ensure safety.  Also discussed at length, pt's home setup and DME needs.  Pt completed all functional mobility throughout session with supervision needing mod intermittent Vcs for safe RW management and problem solving.  Pt completed supine to sit, sit to stand from EOB and ambulated with RW to bathroom and completed toilet transfer.  Clothing mgt completed with mod I. Pt initially reported she did not have to urinate, however upon completing toilet transfer pt had continent episode of urine.  Therapist and daughters stepped out of bathroom and pt instructed to call therapist back in prior to standing however, pt observed standing and flushing toilet without notifying therapist and also requiring Vcs for safety due to pt attempting to ambulate without RW.  Pt ambulated to ADL bathroom using RW with one seated RB needing intermittent Vcs for improved upright posture and forward gaze due to pt looking down at feet.  Pt completed tub bench transfer with CGA.  Pt ambulated approximately 100 feet with RW with close supervision then needing seated RB.  W/c transport back to room for energy conservation due to pt scheduled for PT directly after OT session.  Call bell in reach, seat alarm on.    Therapy Documentation Precautions:  Precautions Precautions: Fall Restrictions Weight Bearing Restrictions: No    Therapy/Group: Individual  Therapy  [name] 05/23/2021, 3:22 PM

## 2021-05-23 NOTE — Patient Care Conference (Signed)
Inpatient RehabilitationTeam Conference and Plan of Care Update Date: 05/23/2021   Time: 13:32 PM    Patient Name: Madison Davidson      Medical Record Number: [id number]  Date of Birth: [birth year] Sex: Female         Room/Bed: 4W01C/4W01C-01 Payor Info: Payor: MEDICARE / Plan: MEDICARE PART A AND B / Product Type: *No Product type* /    Admit Date/Time:  05/15/2021  2:29 PM  Primary Diagnosis:  Large vessel stroke [facility])  Hospital Problems: Principal Problem:   Large vessel stroke [facility])    Expected Discharge Date: Expected Discharge Date: 05/25/21  Team Members Present: Physician leading conference: Dr. Leeroy Cha Care Coodinator Present: Dorien Chihuahua, RN, BSN, CRRN;Becky Dupree, LCSW Nurse Present: Dorien Chihuahua, RN PT Present: Ginnie Smart, PT OT Present: Leretha Pol, OT SLP Present: Charolett Bumpers, SLP PPS Coordinator present : Gunnar Fusi, SLP     Current Status/Progress Goal Weekly Team Focus  Bowel/Bladder   continent of bowel and bladder  LBM 05/22/21  remain continent  assess q shift and prn   Swallow/Nutrition/ Hydration   dys 2 textures and thin liquids, sup A remain on texturess due to dentures not present and cognitive impairments  Supervision A  swallow strategies and education   ADL's             Mobility   CGA to supervision depending on fatigue levels. Ambulates ~281ft with RW and can go up 8 steps with CGA and 2 rails.  supervision  Family training, pt education, functional mobility, safety awareness, DC planning   Communication   mod-min A  Supervision A  speech intelligibility, word finding and education   Safety/Cognition/ Behavioral Observations  Mod A  Min A  orientation, basic problem solving, error/safety awareness, recall sustained attention and intellectual awareness   Pain   no pain  no pain  assess q shift and prn   Skin   intact  remain intact  assess skin q shift and prn     Discharge Planning:  Daughter's coming in  for education unsure if can provide 24/7 supervision at DC. May need to puruse short term NHP   Team Discussion: UTI treated. Great toe better with topical fungal and MD adjusting meds for BP and monitoring. Hypoglycemia noted with delayed processing and incontinence with little awareness; and decreased alertness. MD checking CXR.   Patient on target to meet rehab goals: Currently Mod assist for problem solving and on a D2 diet as patient does not have dentures. Supervision assist for self care and able to ambulate 100-20' with supervision and CGA for steps however can be total assist at times; appears off in space.  *See Care Plan and progress notes for long and short-term goals.   Revisions to Treatment Plan:   Working on problem solving error awareness, attention, and awareness of deficits Teaching Needs: Safety, supervision recommendations, secondary stroke risk management/DM management, medications, etc.  Current Barriers to Discharge: Decreased caregiver support and Home enviroment access/layout  Possible Resolutions to Barriers: Family education completed 05/23/21 with daughter Family working on meals on wheel access and Life Alert System    Medical Summary Current Status: hypoglycemic to 63, fatigued, loose and incontinent stool, UTI, SBP elevated, DBP soft, right great toe onchymychosis  Barriers to Discharge: Medical stability;Wound care  Barriers to Discharge Comments: hypoglycemic to 63, fatigued, loose and incontinent stool, UTI, SBP elevated, DBP soft, right great toe onchymychosis Possible Resolutions to [employer] Focus: decrease glipizide to 5mg  BID,  completed course of macrobid, conitnue topical antifungal   Continued Need for Acute Rehabilitation Level of Care: The patient requires daily medical management by a physician with specialized training in physical medicine and rehabilitation for the following reasons: Direction of a multidisciplinary physical  rehabilitation program to maximize functional independence : Yes Medical management of patient stability for increased activity during participation in an intensive rehabilitation regime.: Yes Analysis of laboratory values and/or radiology reports with any subsequent need for medication adjustment and/or medical intervention. : Yes   I attest that I was present, lead the team conference, and concur with the assessment and plan of the team.   Dorien Chihuahua B 05/23/2021, 4:40 PM

## 2021-05-23 NOTE — Progress Notes (Signed)
Occupational Therapy Session Note  Patient Details  Name: Madison Davidson MRN: [id number] Date of Birth: Oct 11, 1951  Today's Date: 05/23/2021 OT Individual Time: 3662-9476 OT Individual Time Calculation (min): 5 min    Short Term Goals: Week 1:  OT Short Term Goal 1 (Week 1): STGs = LTGs  Skilled Therapeutic Interventions/Progress Updates:    Pt received supine, sleeping deeply in bed with mask on, difficult to arouse with voice and tactile cues. Pt kept eyes closed but responded slightly verbally and nodding. Attempted multiple times to arouse further and offered variety of activities for OT session, but pt remained mostly asleep and shaking head 'no'. When pt asked if she wanted OT to come back later, pt nodded 'yes'. OT will attempt to check back in with pt as schedule allows.   Therapy Documentation Precautions:  Precautions Precautions: Fall Restrictions Weight Bearing Restrictions: No General: General OT Amount of Missed Time: 30 Minutes  Pain: Pain Assessment Pain Score: 0-No pain   Therapy/Group: Individual Therapy  Mellissa Kohut 05/23/2021, 4:53 PM

## 2021-05-23 NOTE — Progress Notes (Addendum)
Inpatient Rehabilitation Care Coordinator Discharge Note  The overall goal for the admission was met for:   Discharge location: Yes-HOME WITH DAUGHTER'S CHECKING ON ON AND HOPEFULLY BEING ABLE TO STAY WITH HER WITH HOUSING PERMISSION  Length of Stay: Yes-10 DAYS  Discharge activity level: Yes-SUPERVISION LEVEL  Home/community participation: Yes  Services provided included: MD, RD, PT, OT, SLP, RN, CM, Pharmacy, and SW  Financial Services: Medicare and Medicaid  Choices offered to/list presented to:pt and daughters  Follow-up services arranged: Home Health: [facility]-PT,OT,SP, DME: [facility], and Patient/Family has no preference for HH/DME agencies [place]. AEROFLOW REFERRAL MADE FOR INCONTINENCE BRIEFS  Comments (or additional information):DAUGHTER;S HERE FOR EDUCATION AND ARE AWARE OF HER CARE NEEDS.  Patient/Family verbalized understanding of follow-up arrangements: Yes  Individual responsible for coordination of the follow-up plan: [facility] [phone]  Confirmed correct DME delivered: Elease Hashimoto 05/23/2021    Dupree, Gardiner Rhyme

## 2021-05-23 NOTE — Progress Notes (Signed)
Hypoglycemic Event  CBG: 63  Treatment: po pudding and liquids  Symptoms: None  Follow-up CBG: [id number] CBG Result:73  Possible Reasons for Event: Unknown  Comments/MD notified [address] Helyn Numbers., PA    Belva Chimes

## 2021-05-23 NOTE — Progress Notes (Addendum)
Patient ID: Madison Davidson, female   DOB: 1951/09/11, 70 y.o.   MRN: [id number] Met with daughter's who are here to attend therapies with Mom in preparation for discharge Thursday. Made aware of team conference supervision level goals and discharge 6/30. All want to take mom home and see how it goes. Have asked MD to write letter for them to be able to stay with her in her apartment. MD aware of this. Will order DME and follow up arranged Home health. Daughter's FMLA forms completed and given back to her.

## 2021-05-23 NOTE — Progress Notes (Signed)
Physical Therapy Session Note  Patient Details  Name: Madison Davidson MRN: [id number] Date of Birth: 26-Feb-1951  Today's Date: 05/23/2021 PT Individual Time: 1430-1455 PT Individual Time Calculation (min): 25 min   Short Term Goals: Week 1:  PT Short Term Goal 1 (Week 1): STG = LTG due to ELOS  Skilled Therapeutic Interventions/Progress Updates:    Pt greeted sitting in w/c to start session, 2 daughters at bedside for scheduled family education and training. Pt agreeable to PT tx with no reports of pain. Discussed with family pt's current mobility status and PT goals, need for 24/7 supervision at discharge for safety concerns, home safety training, DME rec's, and roles of f/u therapies. All questions and concerns addressed. Family present for active observation for remainder of session. Pt wheeled to ortho gym for time management. Completed car transfer with supervision and RW with appropriate understanding of technique and safety awareness. She ambulated >277f with supervision and RW on level surfaces and cues needed for keeping body within walker frame. She navigated up/down 8 steps with supervision and 2 hand rails with cues for general safety awareness - pt without LOB or knee buckling throughout session. Pt wheeled back to her room for time management and family wanting to assist combing hair and helping her clean up. She remained seated in w/c with all needs met.   Therapy Documentation Precautions:  Precautions Precautions: Fall Restrictions Weight Bearing Restrictions: No General:    Therapy/Group: Individual Therapy  CAlger Simons6/28/2022, 7:35 AM

## 2021-05-24 LAB — GLUCOSE, CAPILLARY
Glucose-Capillary: 99 mg/dL (ref 70–99)
Glucose-Capillary: 193 mg/dL — ABNORMAL HIGH (ref 70–99)
Glucose-Capillary: 108 mg/dL — ABNORMAL HIGH (ref 70–99)
Glucose-Capillary: 101 mg/dL — ABNORMAL HIGH (ref 70–99)
Glucose-Capillary: 134 mg/dL — ABNORMAL HIGH (ref 70–99)

## 2021-05-24 MED ORDER — CALCIUM POLYCARBOPHIL 625 MG PO TABS: 625.0000 mg | ORAL_TABLET | Freq: Two times a day (BID) | ORAL | 0 refills | Status: DC

## 2021-05-24 MED ORDER — CITALOPRAM HYDROBROMIDE 10 MG PO TABS: 10.0000 mg | ORAL_TABLET | Freq: Every day | ORAL | 0 refills | Status: DC

## 2021-05-24 MED ORDER — ACETAMINOPHEN 325 MG PO TABS: 650.0000 mg | ORAL_TABLET | Freq: Four times a day (QID) | ORAL | Status: AC | PRN

## 2021-05-24 MED ORDER — CLOPIDOGREL BISULFATE 75 MG PO TABS: 75.0000 mg | ORAL_TABLET | Freq: Every day | ORAL | 1 refills | Status: AC

## 2021-05-24 MED ORDER — LEVOTHYROXINE SODIUM 125 MCG PO TABS: 125.0000 ug | ORAL_TABLET | Freq: Every day | ORAL | 0 refills | Status: DC

## 2021-05-24 MED ORDER — RISAQUAD PO CAPS: 2.0000 | ORAL_CAPSULE | Freq: Three times a day (TID) | ORAL | 0 refills | Status: DC

## 2021-05-24 MED ORDER — VITAMIN D (ERGOCALCIFEROL) 1.25 MG (50000 UNIT) PO CAPS: 50000.0000 [IU] | ORAL_CAPSULE | ORAL | 0 refills | Status: DC

## 2021-05-24 MED ORDER — GLIPIZIDE 5 MG PO TABS: 5.0000 mg | ORAL_TABLET | Freq: Two times a day (BID) | ORAL | 0 refills | Status: DC

## 2021-05-24 MED ORDER — LISINOPRIL 20 MG PO TABS: 20.0000 mg | ORAL_TABLET | Freq: Every day | ORAL | 0 refills | Status: AC

## 2021-05-24 MED ORDER — METFORMIN HCL 1000 MG PO TABS: 1000.0000 mg | ORAL_TABLET | Freq: Two times a day (BID) | ORAL | 0 refills | Status: AC

## 2021-05-24 MED ORDER — ATORVASTATIN CALCIUM 20 MG PO TABS: 80.0000 mg | ORAL_TABLET | Freq: Every day | ORAL | 0 refills | Status: DC

## 2021-05-24 NOTE — Progress Notes (Signed)
PROGRESS NOTE   Subjective/Complaints:  NO issues- LBM last night- denies pain.  Ready for d/c tomorrow.   ROS:   Pt denies SOB, abd pain, CP, N/V/C/D, and vision changes   Objective:   DG Chest 2 View  Result Date: 05/23/2021 CLINICAL DATA:  Tachypnea. EXAM: CHEST - 2 VIEW COMPARISON:  February 11, 2010. FINDINGS: The heart size and mediastinal contours are within normal limits. Both lungs are clear. The visualized skeletal structures are unremarkable. IMPRESSION: No active cardiopulmonary disease. Electronically Signed   By: Marijo Conception M.D.   On: 05/23/2021 18:46   No results for input(s): WBC, HGB, HCT, PLT in the last 72 hours.  No results for input(s): NA, K, CL, CO2, GLUCOSE, BUN, CREATININE, CALCIUM in the last 72 hours.   Intake/Output Summary (Last 24 hours) at 05/24/2021 0843 Last data filed at 05/24/2021 0740 Gross per 24 hour  Intake 990 ml  Output --  Net 990 ml        Physical Exam: Vital Signs Blood pressure (!) 154/55, pulse 61, temperature 97.9 F (36.6 C), temperature source Oral, resp. rate 16, height 5\' 2"  (1.575 m), weight 81.2 kg, SpO2 98 %.    General: awake, alert, appropriate, sitting up in bed watching TV, NAD HENT: conjugate gaze; oropharynx moist CV: regular rate; no JVD Pulmonary: CTA B/L; no W/R/R- good air movement GI: soft, NT, ND, (+)BS Psychiatric: appropriate; flat Neurological: alert  Skin: R great toe- looks great- twisted nail, but no erythema, no wound- was wrapped.  Neurological:    Comments: Patient is alert.  No acute distress.  Makes eye contact with examiner.  Speech is mildly dysarthric but intelligible.  Follows commands.  Fair insight and awareness. 4/5 strength throughout. Psychomotor slowing   Assessment/Plan: 1. Functional deficits which require 3+ hours per day of interdisciplinary therapy in a comprehensive inpatient rehab setting. Physiatrist is  providing close team supervision and 24 hour management of active medical problems listed below. Physiatrist and rehab team continue to assess barriers to discharge/monitor patient progress toward functional and medical goals  Care Tool:  Bathing    Body parts bathed by patient: Right arm, Left arm, Chest, Abdomen, Front perineal area, Buttocks, Right upper leg, Left upper leg, Right lower leg, Face, Left lower leg   Body parts bathed by helper: Left lower leg     Bathing assist Assist Level: Supervision/Verbal cueing     Upper Body Dressing/Undressing Upper body dressing   What is the patient wearing?: Pull over shirt    Upper body assist Assist Level: Supervision/Verbal cueing    Lower Body Dressing/Undressing Lower body dressing      What is the patient wearing?: Underwear/pull up, Pants     Lower body assist Assist for lower body dressing: Contact Guard/Touching assist     Toileting Toileting    Toileting assist Assist for toileting: Supervision/Verbal cueing Assistive Device Comment:  (front wheel walker)   Transfers Chair/bed transfer  Transfers assist     Chair/bed transfer assist level: Contact Guard/Touching assist     Locomotion Ambulation   Ambulation assist      Assist level: Contact Guard/Touching assist Assistive device: Walker-rolling Max  distance: 150   Walk 10 feet activity   Assist     Assist level: Contact Guard/Touching assist Assistive device: Walker-rolling   Walk 50 feet activity   Assist    Assist level: Contact Guard/Touching assist Assistive device: Walker-rolling    Walk 150 feet activity   Assist Walk 150 feet activity did not occur: Safety/medical concerns (fatigue)  Assist level: Contact Guard/Touching assist Assistive device: Walker-rolling    Walk 10 feet on uneven surface  activity   Assist Walk 10 feet on uneven surfaces activity did not occur: Safety/medical concerns          Wheelchair     Assist Will patient use wheelchair at discharge?: No   Wheelchair activity did not occur: N/A         Wheelchair 50 feet with 2 turns activity    Assist    Wheelchair 50 feet with 2 turns activity did not occur: N/A       Wheelchair 150 feet activity     Assist  Wheelchair 150 feet activity did not occur: N/A       Blood pressure (!) 154/55, pulse 61, temperature 97.9 F (36.6 C), temperature source Oral, resp. rate 16, height 5\' 2"  (1.575 m), weight 81.2 kg, SpO2 98 %.  Medical Problem List and Plan: 1.  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 Discussed getting LifeAlert for patient, she does qualify for Meals on Wheels   Continue CIR- PT, OT and SLP- d/c tomorrow 2.  Impaired mobility, ambulating 150 feet -DVT/anticoagulation: d/c Lovenox             -antiplatelet therapy: Continue Aspirin 81 mg daily and Plavix 75 mg daily x3 months then aspirin alone 3. Pain Management: Tylenol  6/28- denies pain- con't tylenol prn 4. Depression: Discussed with daughter. She was depressed prior to stroke.Celexa 10 mg daily.  Provide emotional support             -antipsychotic agents: N/A 5. Neuropsych: This patient is capable of making decisions on her own behalf. 6. Skin/Wound Care: Routine skin checks 7. Fluids/Electrolytes/Nutrition: Routine in and outs with follow-up chemistries 8.  Hyperlipidemia.  Lipitor 9.  Dysphagia.  Dysphagia #2 thin liquids.  Follow-up speech therapy 10.  Diabetes mellitus.  Hemoglobin A1c 7.1.  Glucotrol 10 mg twice daily, Glucophage 1000 mg twice daily. CBGs elevated. Provide dietary education. 6/28: hypoglycemic to 63:  decrease glipizide to 5mg  BID. 6/29- BG 84-152- so con't current regimen  11.  Hypothyroidism.  Continue Synthroid 12.  Hypertension.  Continue Lisinopril to 20mg .  Cr normal. Improved. Monitor with increased mobility. K and magnesium normal.   6/29- BP a little elevated 150s/50s- con't regimen and have PCP titrate BP meds 13. Vitamin D deficiency: continue ergocalciferol 50,000U once weekly for 7 weeks.  14. Insomnia: continue melatonin 3mg  PRN 15. UTI: completed course of macrobid three days. 60,000 GNR. Start probiotic. UC sensitive. 16. Right big toe onychomycosis: topical antifungal ordered and we have scheduled podiatry f/u for toenail clipping. 6/27- looks much better- con't antifungal, however got rid of dressing 17. Urinary retention 6/27- pt needs PVRs- is double  voiding frequently- has had ONE scan today- all day- was 122- will monitor 18.  Disposition: HFU scheduled. D/c home with intermittent support from daughters on 6/30 (10 day stay).   6/29- d/c tomorrow    LOS: 9 days A FACE TO FACE EVALUATION WAS PERFORMED  Any Mcneice 05/24/2021, 8:43 AM

## 2021-05-24 NOTE — Progress Notes (Addendum)
Occupational Therapy Discharge Summary  Patient Details  Name: Madison Davidson MRN: [id number] Date of Birth: 1951/03/22  Today's Date: 05/24/2021 OT Individual Time: 1005-1100; 1345-1355 OT Individual Time Calculation (min): 55 min ; and 10 min   Patient has met 9 of 9 long term goals due to improved activity tolerance, improved balance, ability to compensate for deficits, improved attention, and improved awareness.  Patient to discharge at overall Supervision level.  Patient's care partner is independent to provide the necessary cognitive assistance at discharge.    Recommendation:  Patient will benefit from ongoing skilled OT services in home health setting to continue to advance functional skills in the area of BADL.  Equipment: Electronics engineer, RW  Reasons for discharge: treatment goals met and discharge from hospital  Patient/family agrees with progress made and goals achieved: Yes  Skilled Treatment: First Session:  Pt in bathroom sitting on toilet, with daughter present in room and independently demonstrating good supervision assist checking on periodically and cueing her mother to ask for assist before standing up.  Pt reporting that she fell yesterday due to her left ankle "giving out" and stated that she was with a short lady.  OT assessed medical chart and did not find any documentation of a fall or outside visits.  Pt reports she does not feel any pain.  Dtr reports she thinks that pt might be confused because dtr was with her mom most of the day yesterday and does not recall such an event.  Nurse made aware.  Pt did stand up without warning therapist or dtr and toilet clogged with toilet paper upon therapist inspecting.  Pt also required assist to apply of barrier cream on buttocks due to small area of redness over sacrum. Nurse made aware.  Educated pts dtr regarding pressure relief techniques including daily skin inspections, maintaining clean/dry skin, and weight redistribution  in bed and sitting up.  Pt required supervision for clothing mgt and to initiate RW use (pt leaving bathroom without RW) when ambulating back to EOB.  Assessed pts performance skills per below and reviewed results with pt and dtr.  Pt choosing to lay back down and required supervision for body mechanics to reposition towards HOB using bridging technique.  Call bell in reach, bed alarm on.  Second session:  Pt asleep in bed in supine HOB slightly elevated.  Pt reports "Im too tired" when therapist asked pt to participate during OT session.  Pt agreeable to repositioning into sidelying for pressure reduction over sacrum in order to protect skin integrity.  Pt required supervision with step by step Vcs in order to roll to right side and therapist placed pillows behind back for comfort and support.  Call bell in reach, bed alarm on at end of session.    OT Discharge Precautions/Restrictions  Precautions Precautions: Fall Restrictions Weight Bearing Restrictions: No General Chart Reviewed: Yes Pain Pain Assessment Pain Scale: 0-10 Pain Score: 0-No pain ADL ADL Eating: Supervision/safety Where Assessed-Eating: Wheelchair Grooming: Setup Where Assessed-Grooming: Wheelchair Upper Body Bathing: Setup Where Assessed-Upper Body Bathing: Shower Lower Body Bathing: Supervision/safety Where Assessed-Lower Body Bathing: Shower Upper Body Dressing: Supervision/safety Where Assessed-Upper Body Dressing: Edge of bed Lower Body Dressing: Supervision/safety Where Assessed-Lower Body Dressing: Edge of bed Toileting: Other (Comment), Supervision/safety (fluctuates and depends on level of continence; wet stool requires total assist) Where Assessed-Toileting: Glass blower/designer: Close supervision Toilet Transfer Method: Counselling psychologist: Ambulance person Transfer: Close supervison Clinical cytogeneticist Method: Optometrist: Radio broadcast assistant  Walk-In Shower  Transfer: Curator Method: Heritage manager: Civil engineer, contracting with back, Grab bars Vision Baseline Vision/History: Wears glasses Wears Glasses: Distance only Patient Visual Report: No change from baseline Vision Assessment?: No apparent visual deficits Eye Alignment: Within Functional Limits Ocular Range of Motion: Within Functional Limits Alignment/Gaze Preference: Within Defined Limits Visual Fields: No apparent deficits Perception  Perception: Within Functional Limits Praxis Praxis: Impaired Praxis Impairment Details: Initiation Cognition Overall Cognitive Status: Impaired/Different from baseline Arousal/Alertness: Awake/alert Orientation Level: Oriented to person;Oriented to place;Oriented to situation Attention: Focused;Sustained;Selective Focused Attention: Appears intact Sustained Attention: Impaired Sustained Attention Impairment: Functional basic Selective Attention: Impaired Selective Attention Impairment: Functional basic Memory: Impaired Memory Impairment: Storage deficit;Retrieval deficit;Decreased recall of new information;Decreased short term memory Decreased Short Term Memory: Functional basic Awareness: Impaired Awareness Impairment: Emergent impairment;Anticipatory impairment Problem Solving: Impaired Problem Solving Impairment: Functional basic;Functional complex Executive Function: Decision Making;Initiating;Self Monitoring;Self Correcting Reasoning: Impaired Reasoning Impairment: Functional basic Sequencing: Impaired Sequencing Impairment: Functional basic Decision Making: Impaired Decision Making Impairment: Functional basic Initiating: Impaired Initiating Impairment: Functional basic Self Monitoring: Impaired Self Monitoring Impairment: Functional basic Self Correcting: Impaired Self Correcting Impairment: Functional basic Safety/Judgment: Impaired Sensation Sensation Light Touch: Appears  Intact Hot/Cold: Appears Intact Proprioception: Appears Intact Stereognosis: Not tested Coordination Gross Motor Movements are Fluid and Coordinated: No Fine Motor Movements are Fluid and Coordinated: No Coordination and Movement Description: Movement patterns impacted by slow processing time and slow/effortful movements. [place] limited in LUE 2/2 CVA Heel Shin Test: [facility] Motor  Motor Motor: Within Functional Limits Motor - Discharge Observations: Generalized weakness and global deconditioning persists but has improved since date of evaluation. Mobility  Bed Mobility Bed Mobility: Rolling Right;Rolling Left;Sit to Supine;Supine to Sit Rolling Right: Supervision/verbal cueing Rolling Left: Supervision/Verbal cueing Supine to Sit: Supervision/Verbal cueing Sit to Supine: Supervision/Verbal cueing Transfers Sit to Stand: Supervision/Verbal cueing Stand to Sit: Supervision/Verbal cueing  Trunk/Postural Assessment  Cervical Assessment Cervical Assessment: Within Functional Limits Thoracic Assessment Thoracic Assessment: Within Functional Limits Lumbar Assessment Lumbar Assessment: Within Functional Limits Postural Control Postural Control: Within Functional Limits Protective Responses: delayed  Balance Balance Balance Assessed: Yes Static Sitting Balance Static Sitting - Balance Support: No upper extremity supported;Feet supported Static Sitting - Level of Assistance: 7: Independent Dynamic Sitting Balance Dynamic Sitting - Balance Support: Feet supported;No upper extremity supported;During functional activity Dynamic Sitting - Level of Assistance: 5: Stand by assistance Static Standing Balance Static Standing - Balance Support: No upper extremity supported Static Standing - Level of Assistance: 5: Stand by assistance Dynamic Standing Balance Dynamic Standing - Balance Support: No upper extremity supported;During functional activity Dynamic Standing - Level of Assistance: 5:  Stand by assistance Extremity/Trunk Assessment RUE Assessment RUE Assessment: Within Functional Limits LUE Assessment LUE Assessment: Within Functional Limits   Rotha Cassels L Trystyn Dolley 05/24/2021, 10:35 AM

## 2021-05-24 NOTE — Discharge Summary (Signed)
Physical Therapy Discharge Summary  Patient Details  Name: Madison Davidson MRN: [id number] Date of Birth: July 03, 1951  Today's Date: 05/24/2021 PT Individual Time: 8502-7741 PT Individual Time Calculation (min): 20 min    Patient has met 9 of 9 long term goals due to improved activity tolerance, improved balance, improved postural control, increased strength, ability to compensate for deficits, improved attention, and improved awareness.  Patient to discharge at an ambulatory level Supervision.   Patient's care partner is independent to provide the necessary physical and cognitive assistance at discharge.  Reasons goals not met: n/a  Recommendation:  Patient will benefit from ongoing skilled PT services in home health setting to continue to advance safe functional mobility, address ongoing impairments in dynamic standing balance, general strengthening and endurance, gait mechanics, safety awareness, home safety training in order to minimize fall risk.  Equipment: RW  Reasons for discharge: treatment goals met and discharge from hospital  Patient/family agrees with progress made and goals achieved: Yes  PT Discharge Precautions/Restrictions Precautions Precautions: Fall Restrictions Weight Bearing Restrictions: No Vision/Perception  Vision - Assessment Eye Alignment: Within Functional Limits Ocular Range of Motion: Within Functional Limits Perception Perception: Within Functional Limits Praxis Praxis: Intact  Cognition Overall Cognitive Status: Impaired/Different from baseline Arousal/Alertness: Lethargic Orientation Level: Oriented to person;Oriented to place;Oriented to situation Attention: Sustained;Focused Focused Attention: Appears intact Sustained Attention: Impaired Sustained Attention Impairment: Functional basic Memory: Impaired Memory Impairment: Storage deficit;Retrieval deficit;Decreased recall of new information;Decreased short term memory Awareness:  Impaired Awareness Impairment: Emergent impairment Problem Solving: Impaired Problem Solving Impairment: Functional basic Safety/Judgment: Impaired Sensation Sensation Light Touch: Appears Intact Hot/Cold: Appears Intact Proprioception: Appears Intact Stereognosis: Appears Intact Coordination Gross Motor Movements are Fluid and Coordinated: No Fine Motor Movements are Fluid and Coordinated: No Coordination and Movement Description: Movement patterns impacted by slow processing time and slow/effortful movements. [place] limited in LUE 2/2 CVA Heel Shin Test: [facility] Motor  Motor Motor: Within Functional Limits Motor - Discharge Observations: Generalized weakness and global deconditioning persists but has improved since date of evaluation.  Mobility Bed Mobility Bed Mobility: Rolling Right;Rolling Left;Sit to Supine;Supine to Sit Rolling Right: Supervision/verbal cueing Rolling Left: Supervision/Verbal cueing Supine to Sit: Supervision/Verbal cueing Sit to Supine: Supervision/Verbal cueing Transfers Transfers: Sit to Stand;Stand to Sit;Stand Pivot Transfers Sit to Stand: Supervision/Verbal cueing Stand to Sit: Supervision/Verbal cueing Stand Pivot Transfers: Supervision/Verbal cueing Stand Pivot Transfer Details: Verbal cues for precautions/safety;Verbal cues for technique;Verbal cues for sequencing Transfer (Assistive device): None Locomotion  Gait Ambulation: Yes Gait Assistance: Supervision/Verbal cueing Gait Distance (Feet): 150 Feet Assistive device: Rolling walker Gait Assistance Details: Verbal cues for gait pattern;Verbal cues for precautions/safety;Verbal cues for safe use of DME/AE;Verbal cues for technique Gait Gait: Yes Gait Pattern: Impaired Gait Pattern: Step-through pattern;Decreased step length - left;Decreased step length - right;Trunk flexed;Wide base of support;Poor foot clearance - left;Poor foot clearance - right Gait velocity: decreased Stairs / Additional  Locomotion Stairs: Yes Stairs Assistance: Contact Guard/Touching assist Stair Management Technique: Two rails Number of Stairs: 8 Height of Stairs: 6 Ramp: Contact Guard/touching assist Curb: Contact Guard/Touching assist Wheelchair Mobility Wheelchair Mobility: No   Trunk/Postural Assessment  Cervical Assessment Cervical Assessment: Within Functional Limits Thoracic Assessment Thoracic Assessment: Within Functional Limits Lumbar Assessment Lumbar Assessment: Within Functional Limits Postural Control Postural Control: Within Functional Limits  Balance Balance Balance Assessed: Yes Static Sitting Balance Static Sitting - Balance Support: No upper extremity supported;Feet supported Static Sitting - Level of Assistance: 7: Independent Dynamic Sitting Balance Dynamic Sitting - Balance Support:  Feet supported;No upper extremity supported;During functional activity Dynamic Sitting - Level of Assistance: 5: Stand by assistance Static Standing Balance Static Standing - Balance Support: No upper extremity supported Static Standing - Level of Assistance: 5: Stand by assistance Dynamic Standing Balance Dynamic Standing - Balance Support: No upper extremity supported;During functional activity Dynamic Standing - Level of Assistance: 5: Stand by assistance Extremity Assessment      RLE Assessment RLE Assessment: Within Functional Limits LLE Assessment LLE Assessment: Exceptions to [facility] General Strength Comments: Grossly 4/5  Skilled Intervention: Pt greeted supine in bed to start session. She's sleeping and awakens to voice but needs extra time and convincing to participate in OOB therapy. Supine<>sit completed with supervision. Sit<>stand with supervision to RW and ambulated to the bathroom with supervision and RW where she was able to manage her lower body clothes in standing with supervision for balance. Pt continent of bladder and able to perform pericare without assist. She  requested extra time to allow for a bowel movement but unable to produce. Pt continuing to request for extra time for toileting needs. Called for NT to provide supervision and handoff of care to NT in room. Pt missed 25 minutes of skilled therapy for toileting needs.    Dolan Xia P Crosby Bevan PT, DPT 05/24/2021, 7:48 AM

## 2021-05-24 NOTE — Progress Notes (Signed)
Speech Language Pathology Discharge Summary  Patient Details  Name: Madison Davidson MRN: [id number] Date of Birth: [birth year]  Today's Date: 05/24/2021 SLP Individual Time: 1100-1200 SLP Individual Time Calculation (min): 60 min   Skilled Therapeutic Interventions:  Skilled ST services focused on education and cognitive skills. Pt daughter present for education. SLP facilitated basic problem solving, error awareness, sustained attention and recall skills in 3 step ADL picture sequencing tasks and card sorting tasks. Pt required mod A fading to min A verbal cues for error awareness in 3 step picture task and sustained attention impacted at the 5 minute interval during card sorting task by color, shape and number within a field of 5-6. SLP provided education pertaining to need for 24 hour supervision due to basic problem solving, error/safety awareness, recall and sustained attention. Pt's daughter was unclear if this supervision can be provided. All questions answered to satisfaction. Pt was left in room with daughter, call bell within reach and bed alarm set. SLP recommends to continue skilled services.    Patient has met 7 of 8 long term goals.  Patient to discharge at overall Supervision;Min;Mod level.  Reasons goals not met:     Clinical Impression/Discharge Summary:   Pt made good progress meeting 7 out 8 goals. Pt demonstrated improvements in swallow function and cognitive-linguistic skills, however pt continued to be impacted by ongoing lethargy resulting in poor sustained attention, basic problem solving, error awareness/safety awareness and intermittent oral holding due to poor attention. Pt is consuming dys 2 textures and thin liquids with intermittent supervision (dentures not present in hospital) but swallow function is further impacted by inattention. Pt scored 9 out 30 (n=>27) on SLUMS the day before d/c indicating severe cognitive impacted. Education was completed with daughters, but  SLP is concerned with ability top provided 24 hour supervision. Pt benefited from skilled ST services in order to maximize functional independence and reduce burden of care, requiring 24 hour supervision at discharge with continued skilled ST services.  Care Partner:  Caregiver Able to Provide Assistance: Yes  Type of Caregiver Assistance: Physical;Cognitive  Recommendation:  24 hour supervision/assistance;Home Health SLP  Rationale for SLP Follow Up: Maximize functional communication;Maximize cognitive function and independence;Reduce caregiver burden;Maximize swallowing safety   Equipment: N/A   Reasons for discharge: Discharged from hospital   Patient/Family Agrees with Progress Made and Goals Achieved: Yes    Madison Davidson  [facility] 05/24/2021, 12:22 PM

## 2021-05-25 ENCOUNTER — Inpatient Hospital Stay (HOSPITAL_COMMUNITY): Payer: Medicare Other

## 2021-05-25 ENCOUNTER — Telehealth: Payer: Self-pay | Admitting: Physical Medicine and Rehabilitation

## 2021-05-25 LAB — BASIC METABOLIC PANEL
Anion gap: 13 (ref 5–15)
BUN: 19 mg/dL (ref 8–23)
CO2: 21 mmol/L — ABNORMAL LOW (ref 22–32)
Calcium: 9.3 mg/dL (ref 8.9–10.3)
Chloride: 98 mmol/L (ref 98–111)
Creatinine, Ser: 1.17 mg/dL — ABNORMAL HIGH (ref 0.44–1.00)
GFR, Estimated: 50 mL/min — ABNORMAL LOW (ref >60)
Glucose, Bld: 213 mg/dL — ABNORMAL HIGH (ref 70–99)
Potassium: 5.6 mmol/L — ABNORMAL HIGH (ref 3.5–5.1)
Sodium: 132 mmol/L — ABNORMAL LOW (ref 135–145)

## 2021-05-25 LAB — CBC
HCT: 36.6 % (ref 36.0–46.0)
Hemoglobin: 11.7 g/dL — ABNORMAL LOW (ref 12.0–15.0)
MCH: 25.4 pg — ABNORMAL LOW (ref 26.0–34.0)
MCHC: 32 g/dL (ref 30.0–36.0)
MCV: 79.6 fL — ABNORMAL LOW (ref 80.0–100.0)
Platelets: 489 10*3/uL — ABNORMAL HIGH (ref 150–400)
RBC: 4.6 MIL/uL (ref 3.87–5.11)
RDW: 13.7 % (ref 11.5–15.5)
WBC: 13.1 10*3/uL — ABNORMAL HIGH (ref 4.0–10.5)
nRBC: 0 % (ref 0.0–0.2)

## 2021-05-25 LAB — GLUCOSE, CAPILLARY
Glucose-Capillary: 142 mg/dL — ABNORMAL HIGH (ref 70–99)
Glucose-Capillary: 114 mg/dL — ABNORMAL HIGH (ref 70–99)
Glucose-Capillary: 201 mg/dL — ABNORMAL HIGH (ref 70–99)
Glucose-Capillary: 191 mg/dL — ABNORMAL HIGH (ref 70–99)

## 2021-05-25 IMAGING — DX DG ABDOMEN 1V
2 series · 2 of 2 positions shown · non-contrast
Comparison: No recent prior.

CLINICAL DATA: Vomiting.

EXAM:
ABDOMEN - 1 VIEW

[abdomen supine (1 of 2)]
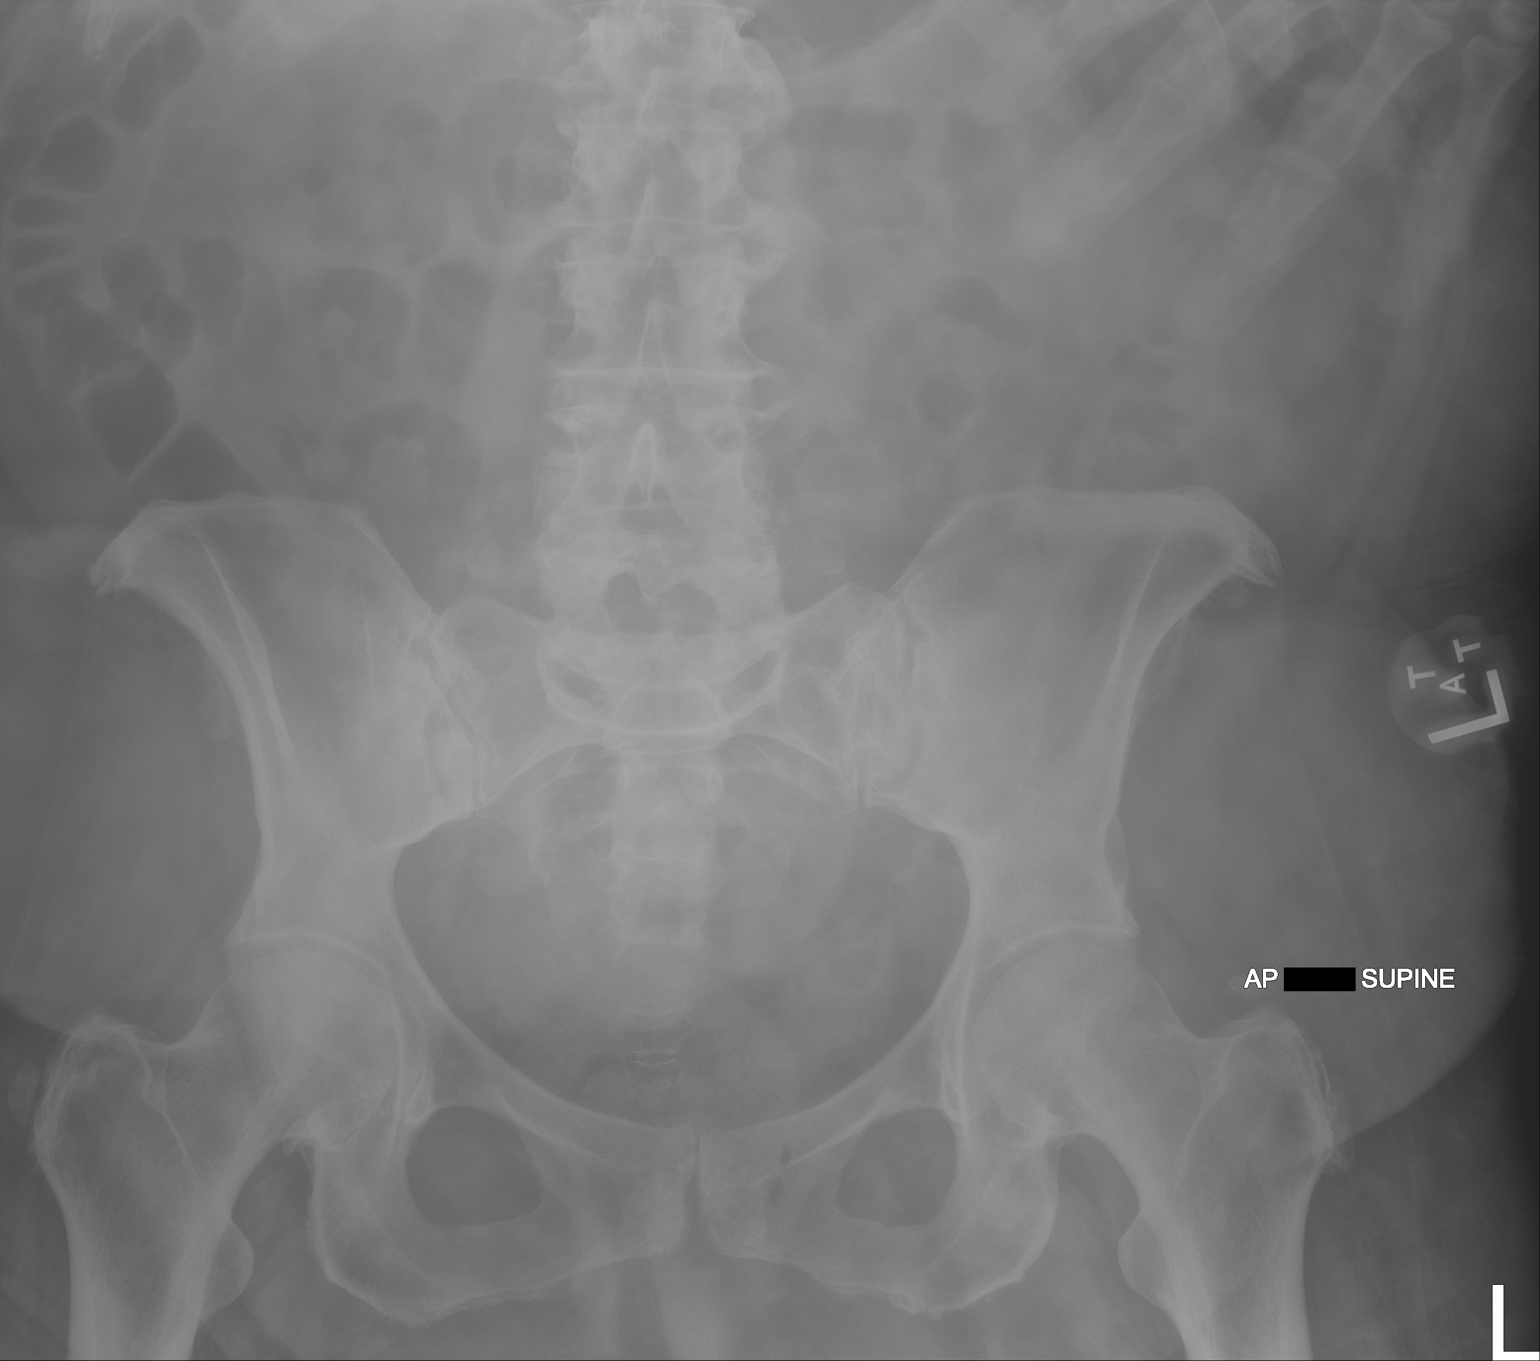

[abdomen supine (2 of 2)]
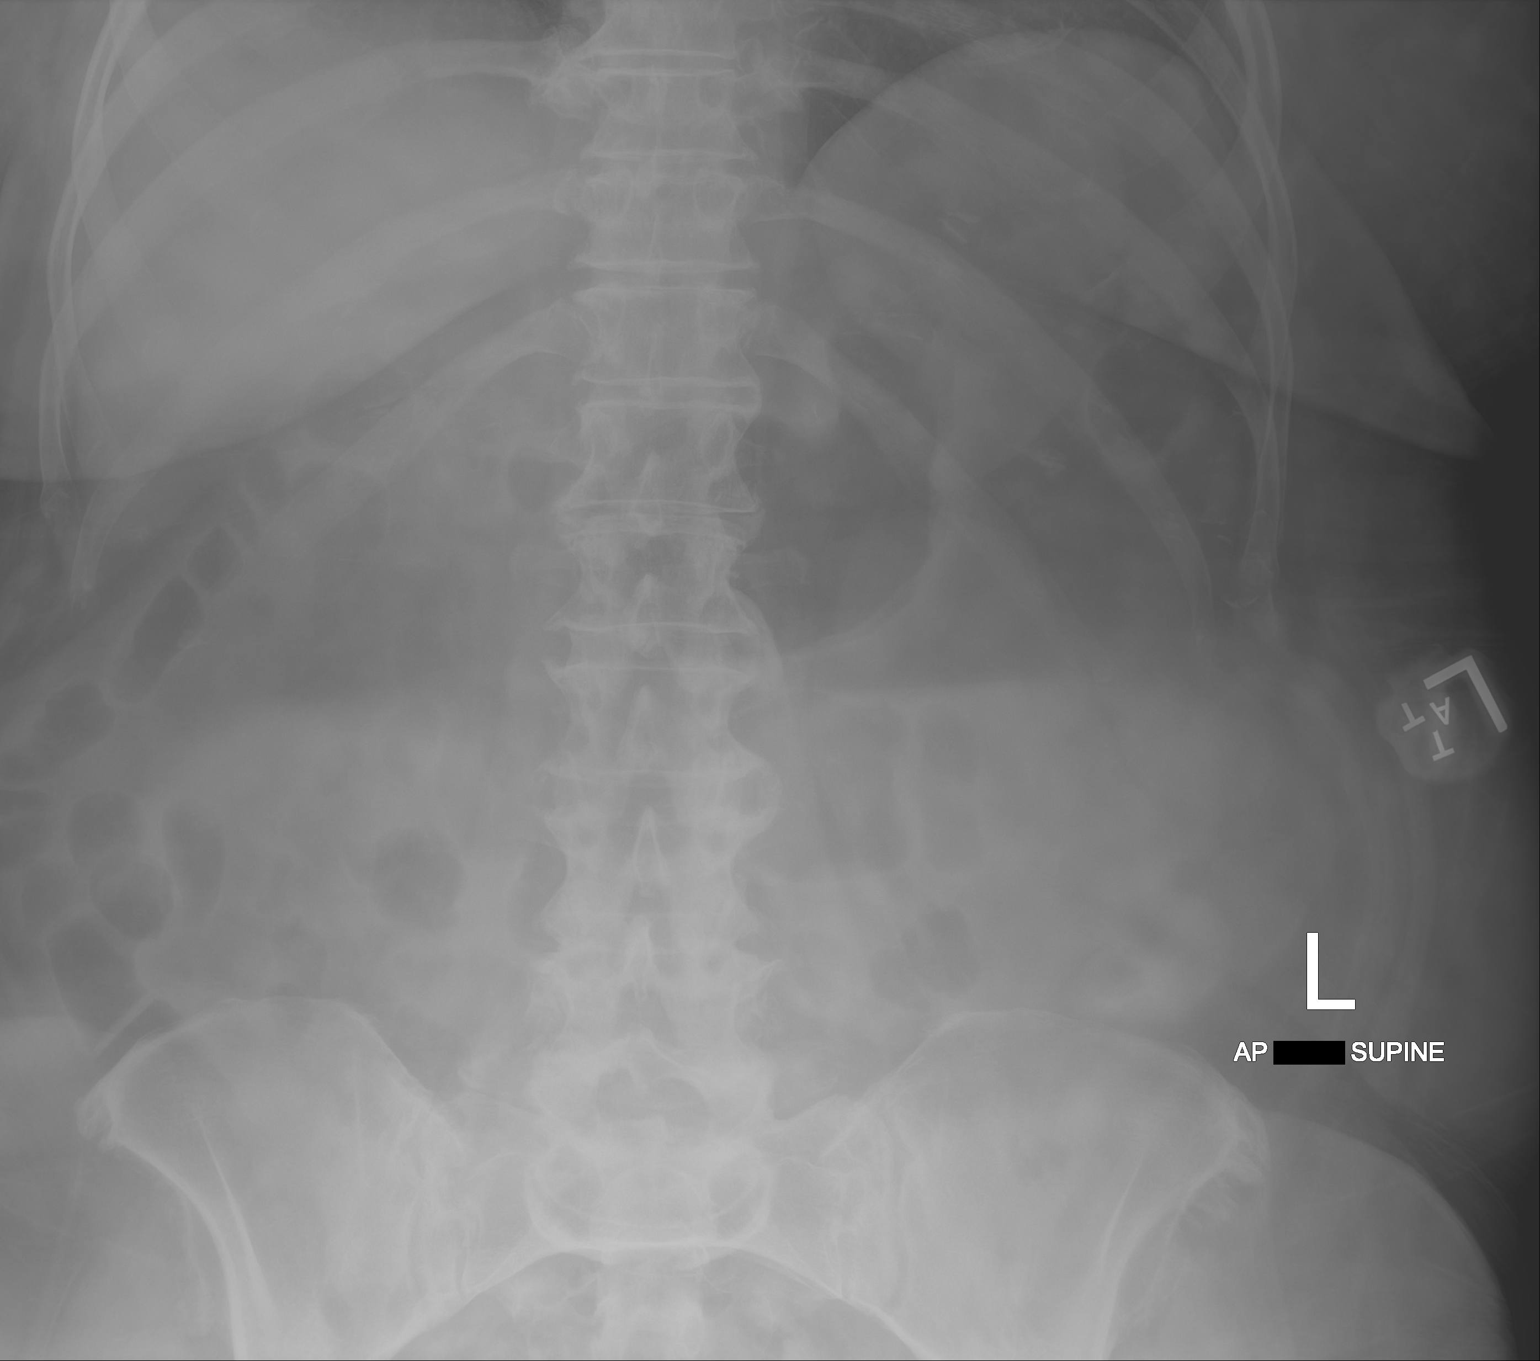

[2 of 2 positions shown; findings below may reference images not displayed]

FINDINGS: Right hemidiaphragm incompletely imaged. Surgical clips left upper
quadrant. Soft tissue structures are unremarkable. No bowel
distention or free air. Degenerative change lumbar spine.
IMPRESSION: No acute abnormality.  No bowel distention.

## 2021-05-25 MED ORDER — ASPIRIN 81 MG PO TBEC: 81.0000 mg | DELAYED_RELEASE_TABLET | Freq: Every day | ORAL | 11 refills | Status: DC

## 2021-05-25 MED ORDER — SORBITOL 70 % SOLN
70.0000 mL | Freq: Every day | Status: DC | PRN
  Administered 2021-05-25: 70 mL via ORAL
  Filled 2021-05-25: qty 90

## 2021-05-25 MED ORDER — SODIUM POLYSTYRENE SULFONATE 15 GM/60ML PO SUSP
15.0000 g | Freq: Once | ORAL | Status: DC
  Filled 2021-05-25: qty 60

## 2021-05-25 NOTE — Progress Notes (Addendum)
PROGRESS NOTE   Subjective/Complaints: Patient had nausea and vomiting, potentially secondary to constipation- will hold discharge until tomorrow Vitals stable Provided with letter to her housing facility to request that daughters be allowed to stay with her  ROS:   Pt denies SOB, abd pain, CP, N/V/C/D, and vision changes   Objective:   DG Chest 2 View  Result Date: 05/23/2021 CLINICAL DATA:  Tachypnea. EXAM: CHEST - 2 VIEW COMPARISON:  February 11, 2010. FINDINGS: The heart size and mediastinal contours are within normal limits. Both lungs are clear. The visualized skeletal structures are unremarkable. IMPRESSION: No active cardiopulmonary disease. Electronically Signed   By: Marijo Conception M.D.   On: 05/23/2021 18:46   No results for input(s): WBC, HGB, HCT, PLT in the last 72 hours.  No results for input(s): NA, K, CL, CO2, GLUCOSE, BUN, CREATININE, CALCIUM in the last 72 hours.   Intake/Output Summary (Last 24 hours) at 05/25/2021 0924 Last data filed at 05/25/2021 0544 Gross per 24 hour  Intake 617 ml  Output --  Net 617 ml        Physical Exam: Vital Signs Blood pressure (!) 139/44, pulse 60, temperature 97.8 F (36.6 C), temperature source Oral, resp. rate 16, height 5\' 2"  (1.575 m), weight 81.2 kg, SpO2 96 %.  Gen: no distress, normal appearing HEENT: oral mucosa pink and moist, NCAT Cardio: Reg rate Chest: normal effort, normal rate of breathing Abd: soft, non-distended Ext: no edema Psych: pleasant, normal affect  Skin: R great toe- looks great- twisted nail, but no erythema, no wound- was wrapped.  Neurological:    Comments: Patient is alert.  No acute distress.  Makes eye contact with examiner.  Speech is mildly dysarthric but intelligible.  Follows commands.  Fair insight and awareness. 4/5 strength throughout. Psychomotor slowing   Assessment/Plan: 1. Functional deficits which require 3+ hours per  day of interdisciplinary therapy in a comprehensive inpatient rehab setting. Physiatrist is providing close team supervision and 24 hour management of active medical problems listed below. Physiatrist and rehab team continue to assess barriers to discharge/monitor patient progress toward functional and medical goals  Care Tool:  Bathing    Body parts bathed by patient: Right arm, Left arm, Chest, Abdomen, Front perineal area, Buttocks, Right upper leg, Left upper leg, Right lower leg, Face, Left lower leg   Body parts bathed by helper: Left lower leg     Bathing assist Assist Level: Supervision/Verbal cueing     Upper Body Dressing/Undressing Upper body dressing   What is the patient wearing?: Pull over shirt    Upper body assist Assist Level: Supervision/Verbal cueing    Lower Body Dressing/Undressing Lower body dressing      What is the patient wearing?: Underwear/pull up, Pants     Lower body assist Assist for lower body dressing: Supervision/Verbal cueing     Toileting Toileting    Toileting assist Assist for toileting: Supervision/Verbal cueing Assistive Device Comment:  (front wheel walker)   Transfers Chair/bed transfer  Transfers assist     Chair/bed transfer assist level: Supervision/Verbal cueing     Locomotion Ambulation   Ambulation assist      Assist  level: Supervision/Verbal cueing Assistive device: Walker-rolling Max distance: 150   Walk 10 feet activity   Assist     Assist level: Supervision/Verbal cueing Assistive device: Walker-rolling   Walk 50 feet activity   Assist    Assist level: Supervision/Verbal cueing Assistive device: Walker-rolling    Walk 150 feet activity   Assist Walk 150 feet activity did not occur: Safety/medical concerns (fatigue)  Assist level: Supervision/Verbal cueing Assistive device: Walker-rolling    Walk 10 feet on uneven surface  activity   Assist Walk 10 feet on uneven surfaces  activity did not occur: Safety/medical concerns   Assist level: Contact Guard/Touching assist Assistive device: Aeronautical engineer Will patient use wheelchair at discharge?: No   Wheelchair activity did not occur: N/A         Wheelchair 50 feet with 2 turns activity    Assist    Wheelchair 50 feet with 2 turns activity did not occur: N/A       Wheelchair 150 feet activity     Assist  Wheelchair 150 feet activity did not occur: N/A       Blood pressure (!) 139/44, pulse 60, temperature 97.8 F (36.6 C), temperature source Oral, resp. rate 16, height 5\' 2"  (1.575 m), weight 81.2 kg, SpO2 96 %.  Medical Problem List and Plan: 1.  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 Discussed getting LifeAlert for patient, she does qualify for Meals on Wheels   D/c tomorrow 2.  Impaired mobility, ambulating 150 feet -DVT/anticoagulation: d/c Lovenox             -antiplatelet therapy: Continue Aspirin 81 mg daily and Plavix 75 mg daily x3 months then aspirin alone 3. Pain Management: Tylenol  6/30- denies pain- continue tylenol prn 4. Depression: Discussed with daughter. She was depressed prior to stroke.Celexa 10 mg daily.  Provide emotional support             -antipsychotic agents: N/A 5. Neuropsych: This patient is capable of making decisions on her own behalf. 6. Skin/Wound Care: Routine skin checks 7. Fluids/Electrolytes/Nutrition: Routine in and outs with follow-up chemistries 8.  Hyperlipidemia.  Lipitor 9.  Dysphagia.  Dysphagia #2 thin liquids.  Follow-up speech therapy 10.  Diabetes mellitus.  Hemoglobin A1c 7.1.  Glucotrol 10 mg twice  daily, Glucophage 1000 mg twice daily. CBGs elevated. Provide dietary education. 6/28: hypoglycemic to 63: decrease glipizide to 5mg  BID. 6/30- BG 142-193- continue current regimen  11.  Hypothyroidism.  Continue Synthroid 12.  Hypertension.  Continue Lisinopril to 20mg .  Cr normal. Improved. Monitor with increased mobility. K and magnesium normal.   6/29- BP a little elevated 150s/50s- con't regimen and have PCP titrate BP meds 13. Vitamin D deficiency: continue ergocalciferol 50,000U once weekly for 7 weeks.  14. Insomnia: continue melatonin 3mg  PRN 15. UTI: completed course of macrobid three days. 60,000 GNR. Start probiotic. UC sensitive. 16. Right big toe onychomycosis: topical antifungal ordered and we have scheduled podiatry f/u for toenail clipping. 6/27- looks much better- con't antifungal, however got rid of dressing 17. Urinary retention 6/27- pt needs PVRs- is  double voiding frequently- has had ONE scan today- all day- was 122- will monitor 18. Nausea and vomiting potentially 2/2 constipation. KUB and laxative ordered. Postpone d/c until tomorrow. 18.  Disposition: HFU scheduled. D/c home with intermittent support from daughters on 7/1 (11 day stay).      LOS: 10 days A FACE TO FACE EVALUATION WAS PERFORMED  Clide Deutscher Mateya Torti 05/25/2021, 9:24 AM

## 2021-05-25 NOTE — Progress Notes (Signed)
Patient was scheduled for discharge today 05/25/2021 however she has had 2 episodes of vomiting with decrease in appetite as well as constipation.  We will hold discharge until a.m.  05/26/2021 check KUB and follow-up chemistries.  All issues were discussed with family.

## 2021-05-25 NOTE — Progress Notes (Signed)
   05/25/21 1303  What Happened  Was fall witnessed? Yes  Who witnessed fall? Family  Patients activity before fall ambulating-assisted  Point of contact buttocks  Was patient injured? No  Patient found on floor  Found by Staff-comment  Stated prior activity other (comment) (Bathroom Assissted)  Follow Up  MD notified Linna Hoff  Time MD notified 1303  Family notified Yes - comment  Time family notified 1303  Simple treatment Other (comment) (Assessment, Vitals)  Progress note created (see row info) Yes  Adult Fall Risk Assessment  Risk Factor Category (scoring not indicated) Fall has occurred during this admission (document High fall risk)  Adult Fall Risk Interventions  Required Bundle Interventions *See Row Information* High fall risk - low, moderate, and high requirements implemented  Additional Interventions Family Supervision;Lap belt while in chair/wheelchair (Rehab only);Use of appropriate toileting equipment (bedpan, BSC, etc.)  Screening for Fall Injury Risk (To be completed on HIGH fall risk patients) - Assessing Need for Floor Mats  Risk For Fall Injury- Criteria for Floor Mats None identified - No additional interventions needed  Will Implement Floor Mats Yes  Vitals  Temp 98 F (36.7 C)  Temp Source Oral  BP (!) 102/50  MAP (mmHg) 66  BP Location Left Arm  BP Method Automatic  Patient Position (if appropriate) Sitting  Pulse Rate (!) 58  Pulse Rate Source Monitor  Resp 16  Oxygen Therapy  SpO2 96 %  O2 Device Room Air  Pain Assessment  Pain Scale 0-10  Pain Score 0  Neurological  Neuro (WDL) WDL  Musculoskeletal  Musculoskeletal (WDL) X  Assistive Device Front wheel walker  Generalized Weakness Yes  Weight Bearing Restrictions No  Integumentary  Integumentary (WDL) X (No new injuries)

## 2021-05-25 NOTE — Telephone Encounter (Signed)
Printed note for patient

## 2021-05-26 LAB — URINALYSIS, ROUTINE W REFLEX MICROSCOPIC
Bilirubin Urine: NEGATIVE
Glucose, UA: NEGATIVE mg/dL
Hgb urine dipstick: NEGATIVE
Ketones, ur: NEGATIVE mg/dL
Leukocytes,Ua: NEGATIVE
Nitrite: NEGATIVE
Protein, ur: NEGATIVE mg/dL
Specific Gravity, Urine: 1.018 (ref 1.005–1.030)
pH: 5 (ref 5.0–8.0)

## 2021-05-26 LAB — BASIC METABOLIC PANEL
Anion gap: 9 (ref 5–15)
BUN: 26 mg/dL — ABNORMAL HIGH (ref 8–23)
CO2: 23 mmol/L (ref 22–32)
Calcium: 8.6 mg/dL — ABNORMAL LOW (ref 8.9–10.3)
Chloride: 101 mmol/L (ref 98–111)
Creatinine, Ser: 1.43 mg/dL — ABNORMAL HIGH (ref 0.44–1.00)
GFR, Estimated: 39 mL/min — ABNORMAL LOW (ref >60)
Glucose, Bld: 97 mg/dL (ref 70–99)
Potassium: 4.5 mmol/L (ref 3.5–5.1)
Sodium: 133 mmol/L — ABNORMAL LOW (ref 135–145)

## 2021-05-26 LAB — GLUCOSE, CAPILLARY
Glucose-Capillary: 116 mg/dL — ABNORMAL HIGH (ref 70–99)
Glucose-Capillary: 135 mg/dL — ABNORMAL HIGH (ref 70–99)
Glucose-Capillary: 127 mg/dL — ABNORMAL HIGH (ref 70–99)
Glucose-Capillary: 146 mg/dL — ABNORMAL HIGH (ref 70–99)

## 2021-05-26 MED ORDER — FLEET ENEMA 7-19 GM/118ML RE ENEM: 1.0000 | ENEMA | Freq: Every day | RECTAL | Status: DC | PRN

## 2021-05-26 MED ORDER — SODIUM CHLORIDE 0.9 % IV SOLN: INTRAVENOUS | Status: AC

## 2021-05-26 NOTE — Progress Notes (Signed)
Patient ID: Madison Davidson, female   DOB: 09-15-1951, 70 y.o.   MRN: [id number]  Pt WC ordered through [place], [place]

## 2021-05-26 NOTE — Progress Notes (Signed)
Pt refused to drink Sodium Polystyrene Sulfonate.  Left with pt overnight thinking she would drink it but she stated she did not like the taste.

## 2021-05-26 NOTE — Progress Notes (Signed)
Patient ID: Madison Davidson, female   DOB: 21-Nov-1951, 70 y.o.   MRN: [id number]  Covering for primary SW, Becky:  SW informed pt will not d/c today due to medical issues. Tomorrow potentially, pending medical labs. Called dtr Madison Davidson to provide this information, dtr understanding. Dtr request is for pt not to d/c tomorrow due to pt transportation. Dtr informed sw of pt being provided a RW initially, but will like to swap for a WC. Sw will reach out to therapy team and contact [employer]. No additional questions or concerns, sw will cont to follow up.  [place], [place]

## 2021-05-26 NOTE — Progress Notes (Signed)
PROGRESS NOTE   Subjective/Complaints:  Pt reports nausea improved- had a large liquid stool yesterday- in evening- refused meds for elevated K+.  Down to 4.5- K+.   Unfortunately, CBC shows WBC up to 13k from 11 on 6/21 and Cr up to 1.43 from 0.72 and BUN rising -up to 26 from 19- so pt developing AKI, likely due to UTI with WBC elevated- will check U/A and Cx and recheck labs in AM and give NS IVFs at 75cc/hour x 24 hours    ROS:  Pt denies SOB, abd pain, CP, N/V/C/D, and vision changes   Objective:   DG Abd 1 View  Result Date: 05/25/2021 CLINICAL DATA:  Vomiting. EXAM: ABDOMEN - 1 VIEW COMPARISON:  No recent prior. FINDINGS: Right hemidiaphragm incompletely imaged. Surgical clips left upper quadrant. Soft tissue structures are unremarkable. No bowel distention or free air. Degenerative change lumbar spine. IMPRESSION: No acute abnormality.  No bowel distention. Electronically Signed   By: Marcello Moores  Register   On: 05/25/2021 16:01   Recent Labs    05/25/21 1417  WBC 13.1*  HGB 11.7*  HCT 36.6  PLT 489*    Recent Labs    05/25/21 1417 05/26/21 0506  NA 132* 133*  K 5.6* 4.5  CL 98 101  CO2 21* 23  GLUCOSE 213* 97  BUN 19 26*  CREATININE 1.17* 1.43*  CALCIUM 9.3 8.6*    No intake or output data in the 24 hours ending 05/26/21 0827       Physical Exam: Vital Signs Blood pressure (!) 129/38, pulse 65, temperature 98.9 F (37.2 C), temperature source Oral, resp. rate 18, height 5\' 2"  (1.575 m), weight 81.2 kg, SpO2 96 %.    General: awake, alert, appropriate, asking for bathroom; finished breakfast, nNAD HENT: conjugate gaze; oropharynx moist CV: regular rate; no JVD Pulmonary: CTA B/L; no W/R/R- good air movement GI: soft, NT, ND, (+)BS- hypoactive Psychiatric: appropriate but flat Neurological: alert  Neurological:    Comments: Patient is alert.  No acute distress.  Makes eye contact with examiner.   Speech is mildly dysarthric but intelligible.  Follows commands.  Fair insight and awareness. 4/5 strength throughout. Psychomotor slowing   Assessment/Plan: 1. Functional deficits which require 3+ hours per day of interdisciplinary therapy in a comprehensive inpatient rehab setting. Physiatrist is providing close team supervision and 24 hour management of active medical problems listed below. Physiatrist and rehab team continue to assess barriers to discharge/monitor patient progress toward functional and medical goals  Care Tool:  Bathing    Body parts bathed by patient: Right arm, Left arm, Chest, Abdomen, Front perineal area, Buttocks, Right upper leg, Left upper leg, Right lower leg, Face, Left lower leg   Body parts bathed by helper: Left lower leg     Bathing assist Assist Level: Supervision/Verbal cueing     Upper Body Dressing/Undressing Upper body dressing   What is the patient wearing?: Pull over shirt    Upper body assist Assist Level: Supervision/Verbal cueing    Lower Body Dressing/Undressing Lower body dressing      What is the patient wearing?: Underwear/pull up, Pants     Lower body assist Assist  for lower body dressing: Supervision/Verbal cueing     Toileting Toileting    Toileting assist Assist for toileting: Supervision/Verbal cueing Assistive Device Comment:  (front wheel walker)   Transfers Chair/bed transfer  Transfers assist     Chair/bed transfer assist level: Supervision/Verbal cueing     Locomotion Ambulation   Ambulation assist      Assist level: Supervision/Verbal cueing Assistive device: Walker-rolling Max distance: 150   Walk 10 feet activity   Assist     Assist level: Supervision/Verbal cueing Assistive device: Walker-rolling   Walk 50 feet activity   Assist    Assist level: Supervision/Verbal cueing Assistive device: Walker-rolling    Walk 150 feet activity   Assist Walk 150 feet activity did not  occur: Safety/medical concerns (fatigue)  Assist level: Supervision/Verbal cueing Assistive device: Walker-rolling    Walk 10 feet on uneven surface  activity   Assist Walk 10 feet on uneven surfaces activity did not occur: Safety/medical concerns   Assist level: Contact Guard/Touching assist Assistive device: Aeronautical engineer Will patient use wheelchair at discharge?: No   Wheelchair activity did not occur: N/A         Wheelchair 50 feet with 2 turns activity    Assist    Wheelchair 50 feet with 2 turns activity did not occur: N/A       Wheelchair 150 feet activity     Assist  Wheelchair 150 feet activity did not occur: N/A       Blood pressure (!) 129/38, pulse 65, temperature 98.9 F (37.2 C), temperature source Oral, resp. rate 18, height 5\' 2"  (1.575 m), weight 81.2 kg, SpO2 96 %.  Medical Problem List and Plan: 1.  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 Discussed getting LifeAlert for patient, she does qualify for Meals on Wheels   7/1- will keep another 1-2 days, because WBC up to 13k, Elevated Cr and BUN- needs 24 hours IVFs. Will check daily.  2.  Impaired mobility, ambulating 150 feet -DVT/anticoagulation: d/c Lovenox             -antiplatelet therapy: Continue Aspirin 81 mg daily and Plavix 75 mg daily x3 months then aspirin alone 3. Pain Management: Tylenol  6/30- denies pain- continue tylenol prn 4. Depression: Discussed with daughter. She was depressed prior to stroke.Celexa 10 mg daily.  Provide emotional support             -antipsychotic agents: N/A 5. Neuropsych: This patient is capable of making decisions on  her own behalf. 6. Skin/Wound Care: Routine skin checks 7. Fluids/Electrolytes/Nutrition: Routine in and outs with follow-up chemistries 8.  Hyperlipidemia.  Lipitor 9.  Dysphagia.  Dysphagia #2 thin liquids.  Follow-up speech therapy 10.  Diabetes mellitus.  Hemoglobin A1c 7.1.  Glucotrol 10 mg twice daily, Glucophage 1000 mg twice daily. CBGs elevated. Provide dietary education. 6/28: hypoglycemic to 63: decrease glipizide to 5mg  BID. 6/30- BG 142-193- continue current regimen   7/1- Bgs 116-201- con't regimen for now-  11.  Hypothyroidism.  Continue Synthroid 12.  Hypertension.  Continue Lisinopril to 20mg .  Cr normal. Improved.  Monitor with increased mobility. K and magnesium normal.   6/29- BP a little elevated 150s/50s- con't regimen and have PCP titrate BP meds 13. Vitamin D deficiency: continue ergocalciferol 50,000U once weekly for 7 weeks.  14. Insomnia: continue melatonin 3mg  PRN 15. UTI: completed course of macrobid three days. 60,000 GNR. Start probiotic. UC sensitive. 16. Right big toe onychomycosis: topical antifungal ordered and we have scheduled podiatry f/u for toenail clipping. 6/27- looks much better- con't antifungal, however got rid of dressing 17. Urinary retention/ 6/27- pt needs PVRs- is double voiding frequently- has had ONE scan today- all day- was 122- will monitor 18. Nausea and vomiting potentially 2/2 constipation. KUB and laxative ordered.   7/1- nausea resolved- had large liquid BM yesterday evening. Will monitor 18.  Disposition: HFU scheduled. D/c home with intermittent support from daughters on 7/1 (11 day stay).  19. Leukocytosis-  7/1- will recheck in AM- however checking U/A and Cx- might need IV/PO ABX- and labs in AM 20. AKI-  7/1- BUN up to 26 and Cr up to 1.43- from 0.7 1 week ago- will give IVF NS  75cc/hour- 75cc/hour and recheck labs in AM 21. Hyperkalemia  7/1- K+ back down to 4.5- so no intervention- will monitor     LOS: 11 days A FACE  TO FACE EVALUATION WAS PERFORMED  Charleen Madera 05/26/2021, 8:27 AM

## 2021-05-27 DIAGNOSIS — E1169 Type 2 diabetes mellitus with other specified complication: Secondary | ICD-10-CM

## 2021-05-27 DIAGNOSIS — E669 Obesity, unspecified: Secondary | ICD-10-CM

## 2021-05-27 DIAGNOSIS — D72823 Leukemoid reaction: Secondary | ICD-10-CM

## 2021-05-27 DIAGNOSIS — R7989 Other specified abnormal findings of blood chemistry: Secondary | ICD-10-CM

## 2021-05-27 LAB — CBC WITH DIFFERENTIAL/PLATELET
Abs Immature Granulocytes: 0.04 10*3/uL (ref 0.00–0.07)
Basophils Absolute: 0.1 10*3/uL (ref 0.0–0.1)
Basophils Relative: 1 %
Eosinophils Absolute: 0.4 10*3/uL (ref 0.0–0.5)
Eosinophils Relative: 3 %
HCT: 30.2 % — ABNORMAL LOW (ref 36.0–46.0)
Hemoglobin: 10 g/dL — ABNORMAL LOW (ref 12.0–15.0)
Immature Granulocytes: 0 %
Lymphocytes Relative: 18 %
Lymphs Abs: 2.2 10*3/uL (ref 0.7–4.0)
MCH: 25.8 pg — ABNORMAL LOW (ref 26.0–34.0)
MCHC: 33.1 g/dL (ref 30.0–36.0)
MCV: 78 fL — ABNORMAL LOW (ref 80.0–100.0)
Monocytes Absolute: 0.9 10*3/uL (ref 0.1–1.0)
Monocytes Relative: 8 %
Neutro Abs: 8.3 10*3/uL — ABNORMAL HIGH (ref 1.7–7.7)
Neutrophils Relative %: 70 %
Platelets: 364 10*3/uL (ref 150–400)
RBC: 3.87 MIL/uL (ref 3.87–5.11)
RDW: 14.1 % (ref 11.5–15.5)
WBC: 11.9 10*3/uL — ABNORMAL HIGH (ref 4.0–10.5)
nRBC: 0 % (ref 0.0–0.2)

## 2021-05-27 LAB — GLUCOSE, CAPILLARY
Glucose-Capillary: 72 mg/dL (ref 70–99)
Glucose-Capillary: 143 mg/dL — ABNORMAL HIGH (ref 70–99)
Glucose-Capillary: 142 mg/dL — ABNORMAL HIGH (ref 70–99)
Glucose-Capillary: 54 mg/dL — ABNORMAL LOW (ref 70–99)
Glucose-Capillary: 115 mg/dL — ABNORMAL HIGH (ref 70–99)

## 2021-05-27 LAB — BASIC METABOLIC PANEL
Anion gap: 8 (ref 5–15)
BUN: 24 mg/dL — ABNORMAL HIGH (ref 8–23)
CO2: 24 mmol/L (ref 22–32)
Calcium: 8.5 mg/dL — ABNORMAL LOW (ref 8.9–10.3)
Chloride: 104 mmol/L (ref 98–111)
Creatinine, Ser: 0.96 mg/dL (ref 0.44–1.00)
GFR, Estimated: >60 mL/min (ref >60)
Glucose, Bld: 73 mg/dL (ref 70–99)
Potassium: 4.6 mmol/L (ref 3.5–5.1)
Sodium: 136 mmol/L (ref 135–145)

## 2021-05-27 MED ORDER — SODIUM CHLORIDE 0.45 % IV SOLN: INTRAVENOUS | Status: DC

## 2021-05-27 NOTE — Progress Notes (Signed)
PROGRESS NOTE   Subjective/Complaints:  Pt in bed. Slow to arouse. ?behavioral component. Eyes were wide open after I left room.     ROS: Limited due to cognitive/behavioral    Objective:   DG Abd 1 View  Result Date: 05/25/2021 CLINICAL DATA:  Vomiting. EXAM: ABDOMEN - 1 VIEW COMPARISON:  No recent prior. FINDINGS: Right hemidiaphragm incompletely imaged. Surgical clips left upper quadrant. Soft tissue structures are unremarkable. No bowel distention or free air. Degenerative change lumbar spine. IMPRESSION: No acute abnormality.  No bowel distention. Electronically Signed   By: Marcello Moores  Register   On: 05/25/2021 16:01   Recent Labs    05/25/21 1417 05/27/21 0554  WBC 13.1* 11.9*  HGB 11.7* 10.0*  HCT 36.6 30.2*  PLT 489* 364    Recent Labs    05/26/21 0506 05/27/21 0554  NA 133* 136  K 4.5 4.6  CL 101 104  CO2 23 24  GLUCOSE 97 73  BUN 26* 24*  CREATININE 1.43* 0.96  CALCIUM 8.6* 8.5*     Intake/Output Summary (Last 24 hours) at 05/27/2021 1053 Last data filed at 05/27/2021 1000 Gross per 24 hour  Intake 1636.57 ml  Output --  Net 1636.57 ml         Physical Exam: Vital Signs Blood pressure (!) 160/51, pulse 77, temperature 98.3 F (36.8 C), temperature source Oral, resp. rate 18, height 5\' 2"  (1.575 m), weight 81.2 kg, SpO2 98 %.    Constitutional: No distress . Vital signs reviewed. HEENT: EOMI, oral membranes moist Neck: supple Cardiovascular: RRR without murmur. No JVD    Respiratory/Chest: CTA Bilaterally without wheezes or rales. Normal effort    GI/Abdomen: BS +, non-tender, non-distended Ext: no clubbing, cyanosis, or edema Psych: flat, kept eyes closed for much of my visit Neurological:    Comments: dysarthric speech. Occasionally  Follows commands.  Fair insight and awareness. 4/5 strength throughout. Psychomotor slowing   Assessment/Plan: 1. Functional deficits which require 3+  hours per day of interdisciplinary therapy in a comprehensive inpatient rehab setting. Physiatrist is providing close team supervision and 24 hour management of active medical problems listed below. Physiatrist and rehab team continue to assess barriers to discharge/monitor patient progress toward functional and medical goals  Care Tool:  Bathing    Body parts bathed by patient: Right arm, Left arm, Chest, Abdomen, Front perineal area, Buttocks, Right upper leg, Left upper leg, Right lower leg, Face, Left lower leg   Body parts bathed by helper: Left lower leg     Bathing assist Assist Level: Supervision/Verbal cueing     Upper Body Dressing/Undressing Upper body dressing   What is the patient wearing?: Pull over shirt    Upper body assist Assist Level: Supervision/Verbal cueing    Lower Body Dressing/Undressing Lower body dressing      What is the patient wearing?: Underwear/pull up, Pants     Lower body assist Assist for lower body dressing: Supervision/Verbal cueing     Toileting Toileting    Toileting assist Assist for toileting: Supervision/Verbal cueing Assistive Device Comment:  (front wheel walker)   Transfers Chair/bed transfer  Transfers assist     Chair/bed transfer assist level:  Supervision/Verbal cueing     Locomotion Ambulation   Ambulation assist      Assist level: Supervision/Verbal cueing Assistive device: Walker-rolling Max distance: 150   Walk 10 feet activity   Assist     Assist level: Supervision/Verbal cueing Assistive device: Walker-rolling   Walk 50 feet activity   Assist    Assist level: Supervision/Verbal cueing Assistive device: Walker-rolling    Walk 150 feet activity   Assist Walk 150 feet activity did not occur: Safety/medical concerns (fatigue)  Assist level: Supervision/Verbal cueing Assistive device: Walker-rolling    Walk 10 feet on uneven surface  activity   Assist Walk 10 feet on uneven  surfaces activity did not occur: Safety/medical concerns   Assist level: Contact Guard/Touching assist Assistive device: Aeronautical engineer Will patient use wheelchair at discharge?: No   Wheelchair activity did not occur: N/A         Wheelchair 50 feet with 2 turns activity    Assist    Wheelchair 50 feet with 2 turns activity did not occur: N/A       Wheelchair 150 feet activity     Assist  Wheelchair 150 feet activity did not occur: N/A       Blood pressure (!) 160/51, pulse 77, temperature 98.3 F (36.8 C), temperature source Oral, resp. rate 18, height 5\' 2"  (1.575 m), weight 81.2 kg, SpO2 98 %.  Medical Problem List and Plan: 1.  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 Discussed getting LifeAlert for patient, she does qualify for Meals on Wheels    -dc held d/t leukocytosis, incr BUN/Cr -numbers better today -home Mon/Tues?   2.  Impaired mobility, ambulating 150 feet -DVT/anticoagulation: d/c Lovenox             -antiplatelet therapy: Continue Aspirin 81 mg daily and Plavix 75 mg daily x3 months then aspirin alone 3. Pain Management: Tylenol  6/30- denies pain- continue tylenol prn 4. Depression: Discussed with daughter. She was depressed prior to stroke.Celexa 10 mg daily.  Provide emotional support             -antipsychotic agents: N/A 5. Neuropsych: This patient is capable of making decisions on her own behalf. 6. Skin/Wound Care: Routine skin checks 7. Fluids/Electrolytes/Nutrition: Routine in and outs with follow-up chemistries 8.  Hyperlipidemia.  Lipitor 9.  Dysphagia.  Dysphagia #2 thin liquids.  Follow-up speech  therapy 10.  Diabetes mellitus.  Hemoglobin A1c 7.1.  Glucotrol 10 mg twice daily, Glucophage 1000 mg twice daily. CBGs elevated. Provide dietary education. 6/28: hypoglycemic to 63: decrease glipizide to 5mg  BID. 7/2- Bgs 116-201- improved  CBG (last 3)  Recent Labs    05/26/21 1646 05/26/21 2059 05/27/21 0613  GLUCAP 127* 146* 72    11.  Hypothyroidism.  Continue Synthroid 12.  Hypertension.  Continue Lisinopril to 20mg .  Cr normal. Improved. Monitor with increased mobility. K and magnesium normal.   6/29- BP a little elevated 150s/50s-obsv for now 13. Vitamin D deficiency: continue ergocalciferol 50,000U once weekly for 7 weeks.  14. Insomnia: continue melatonin 3mg  PRN 15. UTI: completed course of  macrobid three days. 60,000 GNR. Start probiotic. UC sensitive. 16. Right big toe onychomycosis: topical antifungal ordered and we have scheduled podiatry f/u for toenail clipping. 6/27- looks much better- con't antifungal, however got rid of dressing 17. Urinary retention/ Emptying bladder now 18. Nausea and vomiting potentially 2/2 constipation. KUB and laxative ordered.   7/1- nausea resolved- had large liquid BM yesterday evening. Will monitor, moved bowels 7/2  19. Leukocytosis-  7/2: WBC's back to 11.9 today   -UA negative, UCX pending 20. AKI-  7/2 BUN/Cr improved with IVF   -continue IVF at least tonight   -recheck labs Monday 21. Hyperkalemia  7/1- K+ back down to 4.5, 4.6 7/2     LOS: 12 days A FACE TO FACE EVALUATION WAS PERFORMED  Meredith Staggers 05/27/2021, 10:53 AM

## 2021-05-28 LAB — URINE CULTURE

## 2021-05-28 LAB — GLUCOSE, CAPILLARY
Glucose-Capillary: 100 mg/dL — ABNORMAL HIGH (ref 70–99)
Glucose-Capillary: 100 mg/dL — ABNORMAL HIGH (ref 70–99)
Glucose-Capillary: 102 mg/dL — ABNORMAL HIGH (ref 70–99)
Glucose-Capillary: 156 mg/dL — ABNORMAL HIGH (ref 70–99)

## 2021-05-28 NOTE — Progress Notes (Signed)
Blanchable redness at the sacrum, barrier cream applied. Patient educated on importance of turning in bed and pressure relief.

## 2021-05-28 NOTE — Progress Notes (Addendum)
PROGRESS NOTE   Subjective/Complaints:  Pt in bed eating breakfast. Anxious to get home.   ROS: Limited due to cognitive/behavioral    Objective:   No results found. Recent Labs    05/25/21 1417 05/27/21 0554  WBC 13.1* 11.9*  HGB 11.7* 10.0*  HCT 36.6 30.2*  PLT 489* 364    Recent Labs    05/26/21 0506 05/27/21 0554  NA 133* 136  K 4.5 4.6  CL 101 104  CO2 23 24  GLUCOSE 97 73  BUN 26* 24*  CREATININE 1.43* 0.96  CALCIUM 8.6* 8.5*     Intake/Output Summary (Last 24 hours) at 05/28/2021 0913 Last data filed at 05/28/2021 0847 Gross per 24 hour  Intake 360 ml  Output --  Net 360 ml         Physical Exam: Vital Signs Blood pressure (!) 158/54, pulse (!) 54, temperature 98.2 F (36.8 C), temperature source Oral, resp. rate 16, height 5\' 2"  (1.575 m), weight 81.2 kg, SpO2 99 %.  Constitutional: No distress . Vital signs reviewed. HEENT: EOMI, oral membranes moist Neck: supple Cardiovascular: RRR without murmur. No JVD    Respiratory/Chest: CTA Bilaterally without wheezes or rales. Normal effort    GI/Abdomen: BS +, non-tender, non-distended Ext: no clubbing, cyanosis, or edema Psych: flat, cooperates Neurological:    Comments: dysarthric speech. Occasionally  Follows commands.  Fair insight and awareness. 4/5 strength throughout. Delayed processing   Assessment/Plan: 1. Functional deficits which require 3+ hours per day of interdisciplinary therapy in a comprehensive inpatient rehab setting. Physiatrist is providing close team supervision and 24 hour management of active medical problems listed below. Physiatrist and rehab team continue to assess barriers to discharge/monitor patient progress toward functional and medical goals  Care Tool:  Bathing    Body parts bathed by patient: Right arm, Left arm, Chest, Abdomen, Front perineal area, Buttocks, Right upper leg, Left upper leg, Right lower  leg, Face, Left lower leg   Body parts bathed by helper: Left lower leg     Bathing assist Assist Level: Supervision/Verbal cueing     Upper Body Dressing/Undressing Upper body dressing   What is the patient wearing?: Pull over shirt    Upper body assist Assist Level: Supervision/Verbal cueing    Lower Body Dressing/Undressing Lower body dressing      What is the patient wearing?: Underwear/pull up, Pants     Lower body assist Assist for lower body dressing: Supervision/Verbal cueing     Toileting Toileting    Toileting assist Assist for toileting: Supervision/Verbal cueing Assistive Device Comment:  (front wheel walker)   Transfers Chair/bed transfer  Transfers assist     Chair/bed transfer assist level: Supervision/Verbal cueing     Locomotion Ambulation   Ambulation assist      Assist level: Supervision/Verbal cueing Assistive device: Walker-rolling Max distance: 150   Walk 10 feet activity   Assist     Assist level: Supervision/Verbal cueing Assistive device: Walker-rolling   Walk 50 feet activity   Assist    Assist level: Supervision/Verbal cueing Assistive device: Walker-rolling    Walk 150 feet activity   Assist Walk 150 feet activity did not occur: Safety/medical  concerns (fatigue)  Assist level: Supervision/Verbal cueing Assistive device: Walker-rolling    Walk 10 feet on uneven surface  activity   Assist Walk 10 feet on uneven surfaces activity did not occur: Safety/medical concerns   Assist level: Contact Guard/Touching assist Assistive device: Aeronautical engineer Will patient use wheelchair at discharge?: No   Wheelchair activity did not occur: N/A         Wheelchair 50 feet with 2 turns activity    Assist    Wheelchair 50 feet with 2 turns activity did not occur: N/A       Wheelchair 150 feet activity     Assist  Wheelchair 150 feet activity did not occur: N/A        Blood pressure (!) 158/54, pulse (!) 54, temperature 98.2 F (36.8 C), temperature source Oral, resp. rate 16, height 5\' 2"  (1.575 m), weight 81.2 kg, SpO2 99 %.  Medical Problem List and Plan: 1.  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 Discussed getting LifeAlert for patient, she does qualify for Meals on Wheels    -discharge held Friday d/t leukocytosis, incr BUN/Cr (see below) -might be able to dc tomorrow 7/4   2.  Impaired mobility, ambulating 150 feet -DVT/anticoagulation: d/c Lovenox             -antiplatelet therapy: Continue Aspirin 81 mg daily and Plavix 75 mg daily x3 months then aspirin alone 3. Pain Management: Tylenol  6/30- denies pain- continue tylenol prn 4. Depression: Discussed with daughter. She was depressed prior to stroke.Celexa 10 mg daily.  Provide emotional support             -antipsychotic agents: N/A 5. Neuropsych: This patient is capable of making decisions on her own behalf. 6. Skin/Wound Care: Routine skin checks 7. Fluids/Electrolytes/Nutrition: Routine in and outs with follow-up chemistries 8.  Hyperlipidemia.  Lipitor 9.  Dysphagia.  Dysphagia #2 thin liquids.  Follow-up speech therapy 10.  Diabetes mellitus.  Hemoglobin A1c 7.1.  Glucotrol 10 mg twice daily, Glucophage 1000 mg twice daily. CBGs elevated. Provide dietary education. 6/28: hypoglycemic to 63: decrease glipizide to 5mg  BID. 7/3- still some AM/PM hypoglycemia likely d/t inconsistent PO intake  -encouraged more consistent intake, RN to as well CBG (last 3)  Recent Labs    05/27/21 2112 05/27/21 2231 05/28/21 0607  GLUCAP 54* 115* 100*    11.  Hypothyroidism.  Continue  Synthroid 12.  Hypertension.  Continue Lisinopril to 20mg .  Cr normal. Improved. Monitor with increased mobility. K and magnesium normal.   6/29- BP a little elevated 150s/50s-obsv for now 13. Vitamin D deficiency: continue ergocalciferol 50,000U once weekly for 7 weeks.  14. Insomnia: continue melatonin 3mg  PRN 15. UTI: completed course of macrobid three days. 60,000 GNR. Start probiotic. UC sensitive. 16. Right big toe onychomycosis: topical antifungal ordered and we have scheduled podiatry f/u for toenail clipping. 6/27- looks much better- con't antifungal, however got rid of dressing 17. Urinary retention/ Emptying bladder now 18. Nausea and vomiting potentially 2/2 constipation. KUB and laxative ordered.   7/1- nausea resolved, moved bowels 7/2  19.  Leukocytosis-  7/3: WBC's back to 11.9 7/2   -UA negative, UCX still pending   -afebrile, no s/s infection   -check CBC tomorrow 20. AKI-  7/3 BUN/Cr improved sl with IVF   -continue IVF again tonight   -recheck labs Monday 21. Hyperkalemia  7/1- K+ back down to 4.5, 4.6 7/2     LOS: 13 days A FACE TO FACE EVALUATION WAS PERFORMED  Madison Davidson 05/28/2021, 9:13 AM

## 2021-05-29 LAB — CBC
HCT: 35.2 % — ABNORMAL LOW (ref 36.0–46.0)
Hemoglobin: 11.3 g/dL — ABNORMAL LOW (ref 12.0–15.0)
MCH: 25.6 pg — ABNORMAL LOW (ref 26.0–34.0)
MCHC: 32.1 g/dL (ref 30.0–36.0)
MCV: 79.8 fL — ABNORMAL LOW (ref 80.0–100.0)
Platelets: 418 K/uL — ABNORMAL HIGH (ref 150–400)
RBC: 4.41 MIL/uL (ref 3.87–5.11)
RDW: 13.8 % (ref 11.5–15.5)
WBC: 9.4 K/uL (ref 4.0–10.5)
nRBC: 0 % (ref 0.0–0.2)

## 2021-05-29 LAB — GLUCOSE, CAPILLARY: Glucose-Capillary: 70 mg/dL (ref 70–99)

## 2021-05-29 LAB — BASIC METABOLIC PANEL WITH GFR
Anion gap: 7 (ref 5–15)
BUN: 12 mg/dL (ref 8–23)
CO2: 24 mmol/L (ref 22–32)
Calcium: 9 mg/dL (ref 8.9–10.3)
Chloride: 105 mmol/L (ref 98–111)
Creatinine, Ser: 0.76 mg/dL (ref 0.44–1.00)
GFR, Estimated: >60 mL/min
Glucose, Bld: 84 mg/dL (ref 70–99)
Potassium: 4.7 mmol/L (ref 3.5–5.1)
Sodium: 136 mmol/L (ref 135–145)

## 2021-05-29 NOTE — Progress Notes (Signed)
Patient ID: Madison Davidson, female   DOB: 22-Jun-1951, 70 y.o.   MRN: [id number] Spoke with daughter-[name] to inform of discharge today, according to dan-PA medically stable for DC today.

## 2021-05-29 NOTE — Progress Notes (Signed)
PROGRESS NOTE   Subjective/Complaints: No complaints this morning Had BM yesterday Denies nausea, abdominal pain Labs discussed with patient, stable for d/c today  ROS: Denies constipation   Objective:   No results found. Recent Labs    05/27/21 0554  WBC 11.9*  HGB 10.0*  HCT 30.2*  PLT 364    Recent Labs    05/27/21 0554  NA 136  K 4.6  CL 104  CO2 24  GLUCOSE 73  BUN 24*  CREATININE 0.96  CALCIUM 8.5*     Intake/Output Summary (Last 24 hours) at 05/29/2021 0622 Last data filed at 05/28/2021 1842 Gross per 24 hour  Intake 480 ml  Output 1 ml  Net 479 ml         Physical Exam: Vital Signs Blood pressure (!) 158/63, pulse (!) 52, temperature 97.6 F (36.4 C), temperature source Oral, resp. rate 18, height 5\' 2"  (1.575 m), weight 81.2 kg, SpO2 98 %. Gen: no distress, normal appearing HEENT: oral mucosa pink and moist, NCAT Cardio: Bradycardic Chest: normal effort, normal rate of breathing Abd: soft, non-distended Ext: no edema Psych: flat, cooperates Neurological:    Comments: dysarthric speech. Occasionally  Follows commands.  Fair insight and awareness. 4/5 strength throughout. Delayed processing   Assessment/Plan: 1. Functional deficits which require 3+ hours per day of interdisciplinary therapy in a comprehensive inpatient rehab setting. Physiatrist is providing close team supervision and 24 hour management of active medical problems listed below. Physiatrist and rehab team continue to assess barriers to discharge/monitor patient progress toward functional and medical goals  Care Tool:  Bathing    Body parts bathed by patient: Right arm, Left arm, Chest, Abdomen, Front perineal area, Buttocks, Right upper leg, Left upper leg, Right lower leg, Face, Left lower leg   Body parts bathed by helper: Left lower leg     Bathing assist Assist Level: Supervision/Verbal cueing     Upper Body  Dressing/Undressing Upper body dressing   What is the patient wearing?: Pull over shirt    Upper body assist Assist Level: Supervision/Verbal cueing    Lower Body Dressing/Undressing Lower body dressing      What is the patient wearing?: Underwear/pull up, Pants     Lower body assist Assist for lower body dressing: Supervision/Verbal cueing     Toileting Toileting    Toileting assist Assist for toileting: Supervision/Verbal cueing Assistive Device Comment:  (front wheel walker)   Transfers Chair/bed transfer  Transfers assist     Chair/bed transfer assist level: Supervision/Verbal cueing     Locomotion Ambulation   Ambulation assist      Assist level: Supervision/Verbal cueing Assistive device: Walker-rolling Max distance: 150   Walk 10 feet activity   Assist     Assist level: Supervision/Verbal cueing Assistive device: Walker-rolling   Walk 50 feet activity   Assist    Assist level: Supervision/Verbal cueing Assistive device: Walker-rolling    Walk 150 feet activity   Assist Walk 150 feet activity did not occur: Safety/medical concerns (fatigue)  Assist level: Supervision/Verbal cueing Assistive device: Walker-rolling    Walk 10 feet on uneven surface  activity   Assist Walk 10 feet on uneven surfaces  activity did not occur: Safety/medical concerns   Assist level: Contact Guard/Touching assist Assistive device: Aeronautical engineer Will patient use wheelchair at discharge?: No   Wheelchair activity did not occur: N/A         Wheelchair 50 feet with 2 turns activity    Assist    Wheelchair 50 feet with 2 turns activity did not occur: N/A       Wheelchair 150 feet activity     Assist  Wheelchair 150 feet activity did not occur: N/A       Blood pressure (!) 158/63, pulse (!) 52, temperature 97.6 F (36.4 C), temperature source Oral, resp. rate 18, height 5\' 2"  (1.575 m), weight 81.2  kg, SpO2 98 %.  Medical Problem List and Plan: 1.  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 Discussed getting LifeAlert for patient, she does qualify for Meals on Wheels    -discharge held Friday d/t leukocytosis, incr BUN/Cr (see below) -d/c today 2.  Impaired mobility, ambulating 150 feet -DVT/anticoagulation: d/c Lovenox             -antiplatelet therapy: Continue Aspirin 81 mg daily and Plavix 75 mg daily x3 months then aspirin alone 3. Pain Management: N/A 4. Depression: Discussed with daughter. She was depressed prior to stroke.Celexa 10 mg daily.  Provide emotional support             -antipsychotic agents: N/A 5. Neuropsych: This patient is capable of making decisions on her own behalf. 6. Skin/Wound Care: Routine skin checks 7. Fluids/Electrolytes/Nutrition: Routine in and outs with follow-up chemistries 8.  Hyperlipidemia.  Lipitor 9.  Dysphagia.  Dysphagia #2 thin liquids.  Follow-up speech therapy 10.  Diabetes mellitus.  Hemoglobin A1c 7.1.  Glucotrol 10 mg twice daily, Glucophage 1000 mg twice daily. CBGs elevated. Provide dietary education. 7/4: better controlled- continue current regimen outpatient.  CBG (last 3)  Recent Labs    05/28/21 1641 05/28/21 2107 05/29/21 0615  GLUCAP 102* 156* 70    11.  Hypothyroidism.  Continue Synthroid 12.  Hypertension.  Continue Lisinopril to 20mg .  Cr normal. Improved. Monitor with increased mobility. K and magnesium normal.   7/4- BP a little elevated 150s/50s-obsv for now 13. Vitamin D deficiency: continue ergocalciferol 50,000U once weekly for 7 weeks.  14. Insomnia: continue melatonin 3mg  PRN 15. UTI: completed course of  macrobid three days. 60,000 GNR. Start probiotic. UC sensitive. 16. Right big toe onychomycosis: topical antifungal ordered and we have scheduled podiatry f/u for toenail clipping. 6/27- looks much better- con't antifungal, however got rid of dressing 17. Urinary retention/ Emptying bladder now 18. Nausea and vomiting potentially 2/2 constipation. KUB and laxative ordered.   7/1- nausea resolved, moved bowels 7/2  19. Leukocytosis-  7/3: WBC's back to 11.9 7/2   -UA negative, UCX still pending   -afebrile, no s/s infection   -CBC improved 7/4. UC with multiple species.  20. AKI-resolved with fluids, encourage hydration at home.  21. Hyperkalemia  7/1- K+ back down to 4.5, 4.6 7/2, 4.7 on 7/4   >30 minutes spent in discharge of patient including  review of medications and follow-up appointments, physical examination, and in answering all patient's questions      LOS: 14 days A FACE TO FACE EVALUATION WAS [place] Will Schier 05/29/2021, 6:22 AM

## 2021-05-30 ENCOUNTER — Encounter: Payer: Self-pay | Admitting: Physical Medicine and Rehabilitation

## 2021-06-02 ENCOUNTER — Telehealth: Payer: Self-pay

## 2021-06-02 NOTE — Telephone Encounter (Signed)
Verbal okay for in-home speech therapy granted per protocol to [facility] with [place] (PH# [phone]). Service time frame: once a week for one week, one week off, twice a week for two weeks then once a week for 4 weeks.

## 2021-06-06 ENCOUNTER — Encounter
Payer: Medicare Other | Attending: Physical Medicine and Rehabilitation | Admitting: Physical Medicine and Rehabilitation

## 2021-06-06 ENCOUNTER — Telehealth: Payer: Self-pay

## 2021-06-06 ENCOUNTER — Other Ambulatory Visit: Payer: Self-pay

## 2021-06-06 NOTE — Progress Notes (Signed)
error 

## 2021-06-06 NOTE — Telephone Encounter (Signed)
  Please review or advise:   Lyn the in-home physical therapist called: Patient blood pressure today was 180/78. No other details given).     I advised her call her PCP to report the findings.   Also a verbal okay given for In-home Physical Therapy. To be offered twice a week for 2 weeks, then once week for 6 weeks.

## 2021-06-08 ENCOUNTER — Telehealth: Payer: Self-pay

## 2021-06-08 NOTE — Telephone Encounter (Signed)
Rowe Cl[facility] called to request verbal orders for OT 1W1 2W2 Verbal orders were given .

## 2021-06-11 ENCOUNTER — Encounter: Payer: Self-pay | Admitting: Emergency Medicine

## 2021-06-11 ENCOUNTER — Inpatient Hospital Stay
Admission: EM | Admit: 2021-06-11 | Discharge: 2021-06-13 | DRG: 312 | Disposition: A | Discharge status: Home Health | Payer: Medicare Other | Attending: Internal Medicine | Admitting: Internal Medicine

## 2021-06-11 ENCOUNTER — Observation Stay: Payer: Medicare Other

## 2021-06-11 ENCOUNTER — Other Ambulatory Visit: Payer: Self-pay

## 2021-06-11 ENCOUNTER — Emergency Department: Payer: Medicare Other

## 2021-06-11 DIAGNOSIS — Z20822 Contact with and (suspected) exposure to covid-19: Secondary | ICD-10-CM | POA: Diagnosis present

## 2021-06-11 DIAGNOSIS — Z90710 Acquired absence of both cervix and uterus: Secondary | ICD-10-CM

## 2021-06-11 DIAGNOSIS — I2510 Atherosclerotic heart disease of native coronary artery without angina pectoris: Secondary | ICD-10-CM | POA: Diagnosis present

## 2021-06-11 DIAGNOSIS — E119 Type 2 diabetes mellitus without complications: Secondary | ICD-10-CM | POA: Diagnosis present

## 2021-06-11 DIAGNOSIS — Z8673 Personal history of transient ischemic attack (TIA), and cerebral infarction without residual deficits: Secondary | ICD-10-CM

## 2021-06-11 DIAGNOSIS — I6389 Other cerebral infarction: Secondary | ICD-10-CM | POA: Diagnosis present

## 2021-06-11 DIAGNOSIS — I10 Essential (primary) hypertension: Secondary | ICD-10-CM | POA: Diagnosis present

## 2021-06-11 DIAGNOSIS — W1839XA Other fall on same level, initial encounter: Secondary | ICD-10-CM | POA: Diagnosis present

## 2021-06-11 DIAGNOSIS — Z79890 Hormone replacement therapy (postmenopausal): Secondary | ICD-10-CM

## 2021-06-11 DIAGNOSIS — Z79899 Other long term (current) drug therapy: Secondary | ICD-10-CM

## 2021-06-11 DIAGNOSIS — D509 Iron deficiency anemia, unspecified: Secondary | ICD-10-CM | POA: Diagnosis present

## 2021-06-11 DIAGNOSIS — S0511XA Contusion of eyeball and orbital tissues, right eye, initial encounter: Secondary | ICD-10-CM | POA: Diagnosis present

## 2021-06-11 DIAGNOSIS — I639 Cerebral infarction, unspecified: Secondary | ICD-10-CM | POA: Diagnosis present

## 2021-06-11 DIAGNOSIS — E782 Mixed hyperlipidemia: Secondary | ICD-10-CM | POA: Diagnosis present

## 2021-06-11 DIAGNOSIS — Z7902 Long term (current) use of antithrombotics/antiplatelets: Secondary | ICD-10-CM

## 2021-06-11 DIAGNOSIS — R55 Syncope and collapse: Principal | ICD-10-CM | POA: Diagnosis present

## 2021-06-11 DIAGNOSIS — F32A Depression, unspecified: Secondary | ICD-10-CM | POA: Diagnosis present

## 2021-06-11 DIAGNOSIS — Y9259 Other trade areas as the place of occurrence of the external cause: Secondary | ICD-10-CM

## 2021-06-11 DIAGNOSIS — Z7982 Long term (current) use of aspirin: Secondary | ICD-10-CM

## 2021-06-11 DIAGNOSIS — Z8249 Family history of ischemic heart disease and other diseases of the circulatory system: Secondary | ICD-10-CM

## 2021-06-11 DIAGNOSIS — E11649 Type 2 diabetes mellitus with hypoglycemia without coma: Secondary | ICD-10-CM

## 2021-06-11 DIAGNOSIS — Z7984 Long term (current) use of oral hypoglycemic drugs: Secondary | ICD-10-CM

## 2021-06-11 DIAGNOSIS — E039 Hypothyroidism, unspecified: Secondary | ICD-10-CM | POA: Diagnosis present

## 2021-06-11 DIAGNOSIS — E538 Deficiency of other specified B group vitamins: Secondary | ICD-10-CM | POA: Diagnosis present

## 2021-06-11 LAB — URINALYSIS, COMPLETE (UACMP) WITH MICROSCOPIC
Bilirubin Urine: NEGATIVE
Glucose, UA: NEGATIVE mg/dL
Hgb urine dipstick: NEGATIVE
Ketones, ur: NEGATIVE mg/dL
Nitrite: NEGATIVE
Protein, ur: NEGATIVE mg/dL
Specific Gravity, Urine: 1.009 (ref 1.005–1.030)
pH: 5 (ref 5.0–8.0)

## 2021-06-11 LAB — CBG MONITORING, ED
Glucose-Capillary: 154 mg/dL — ABNORMAL HIGH (ref 70–99)
Glucose-Capillary: 80 mg/dL (ref 70–99)

## 2021-06-11 LAB — BASIC METABOLIC PANEL
Anion gap: 8 (ref 5–15)
BUN: 27 mg/dL — ABNORMAL HIGH (ref 8–23)
CO2: 21 mmol/L — ABNORMAL LOW (ref 22–32)
Calcium: 9 mg/dL (ref 8.9–10.3)
Chloride: 107 mmol/L (ref 98–111)
Creatinine, Ser: 1.07 mg/dL — ABNORMAL HIGH (ref 0.44–1.00)
GFR, Estimated: 56 mL/min — ABNORMAL LOW (ref >60)
Glucose, Bld: 151 mg/dL — ABNORMAL HIGH (ref 70–99)
Potassium: 4.3 mmol/L (ref 3.5–5.1)
Sodium: 136 mmol/L (ref 135–145)

## 2021-06-11 LAB — RESP PANEL BY RT-PCR (FLU A&B, COVID) ARPGX2
Influenza A by PCR: NEGATIVE
Influenza B by PCR: NEGATIVE
SARS Coronavirus 2 by RT PCR: NEGATIVE

## 2021-06-11 LAB — CBC
HCT: 31.7 % — ABNORMAL LOW (ref 36.0–46.0)
Hemoglobin: 10.2 g/dL — ABNORMAL LOW (ref 12.0–15.0)
MCH: 26.1 pg (ref 26.0–34.0)
MCHC: 32.2 g/dL (ref 30.0–36.0)
MCV: 81.1 fL (ref 80.0–100.0)
Platelets: 285 10*3/uL (ref 150–400)
RBC: 3.91 MIL/uL (ref 3.87–5.11)
RDW: 14.6 % (ref 11.5–15.5)
WBC: 12.1 10*3/uL — ABNORMAL HIGH (ref 4.0–10.5)
nRBC: 0 % (ref 0.0–0.2)

## 2021-06-11 LAB — TROPONIN I (HIGH SENSITIVITY)
Troponin I (High Sensitivity): 8 ng/L (ref <18)
Troponin I (High Sensitivity): 10 ng/L (ref <18)

## 2021-06-11 IMAGING — CT CT HEAD W/O CM
3 series · 15 of 47 positions shown, 18 images · non-contrast
Comparison: CT head [DATE]

CLINICAL DATA: Mental status change.  Near syncope.

EXAM:
CT HEAD WITHOUT CONTRAST
CT CERVICAL SPINE WITHOUT CONTRAST
TECHNIQUE: Multidetector CT imaging of the head and cervical spine was
performed following the standard protocol without intravenous
contrast. Multiplanar CT image reconstructions of the cervical spine
were also generated.

[Series 2: head wo · axial · 0.41mm/px · z∈[+206,+336]mm · 9 of 32 slices shown, 12 images]
[im 3/32  brain]
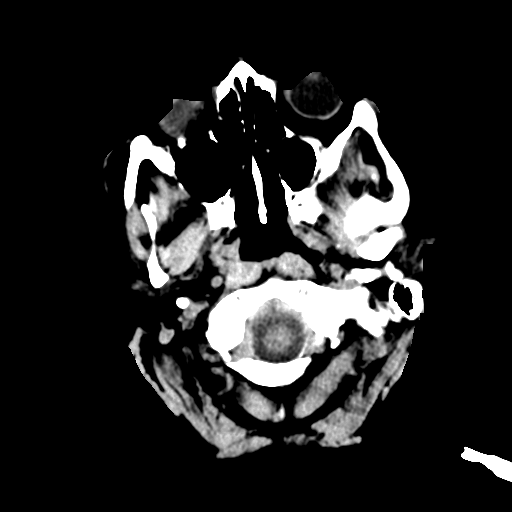
[im 3/32  bone]
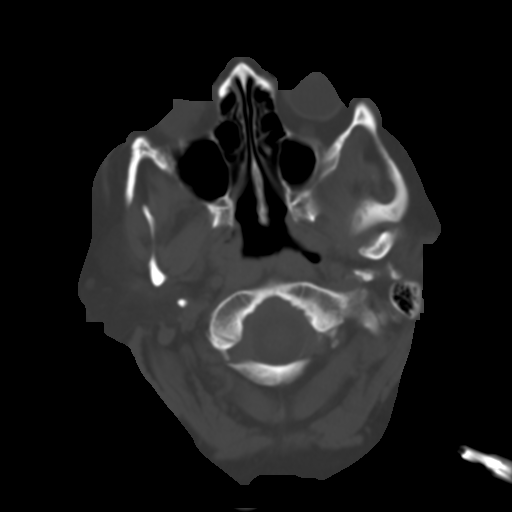
[im 6/32  brain]
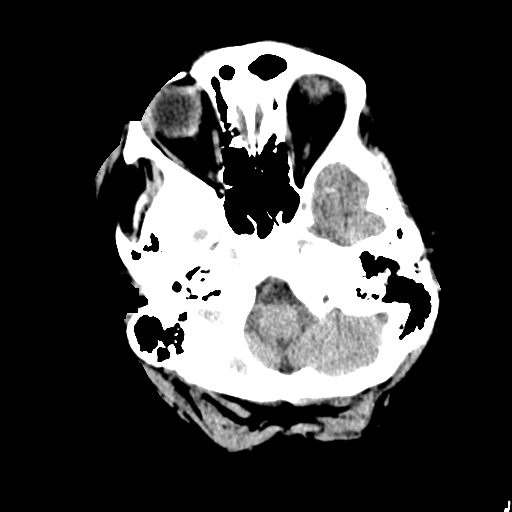
[im 9/32  brain]
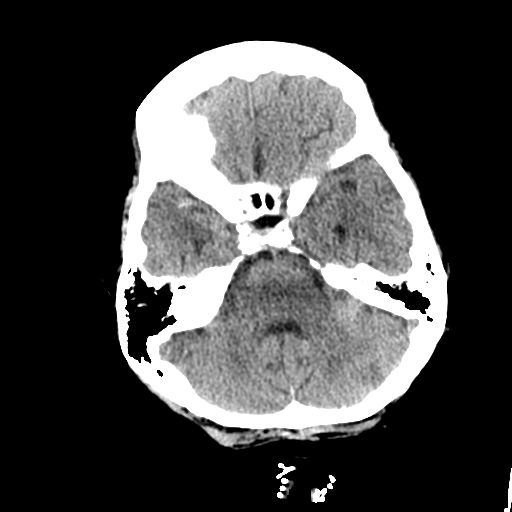
[im 12/32  brain]
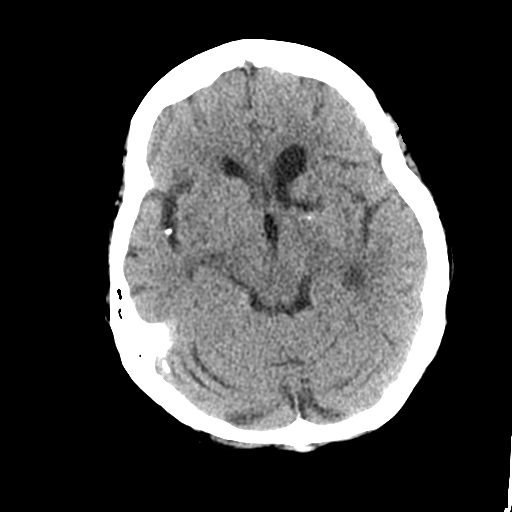
[im 17/32  brain]
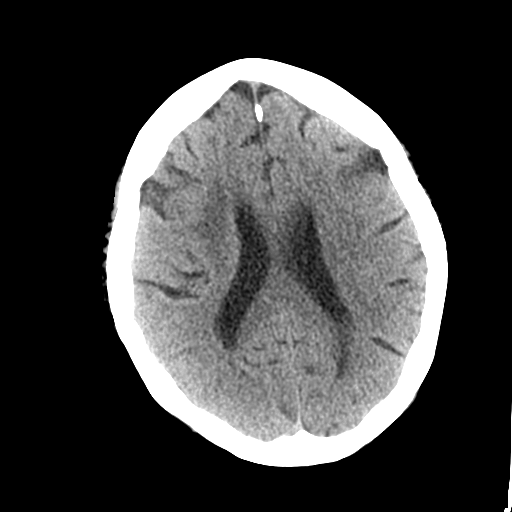
[im 17/32  bone]
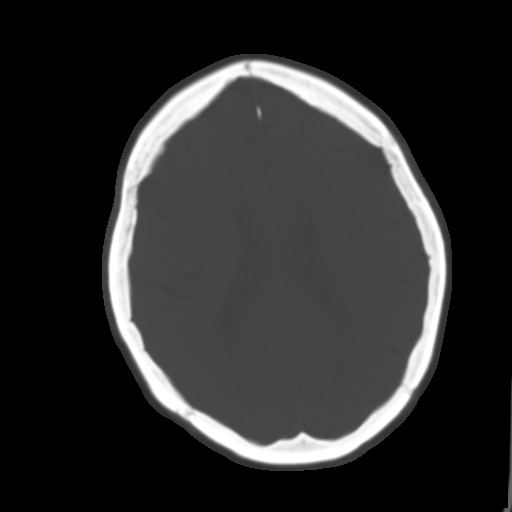
[im 20/32  brain]
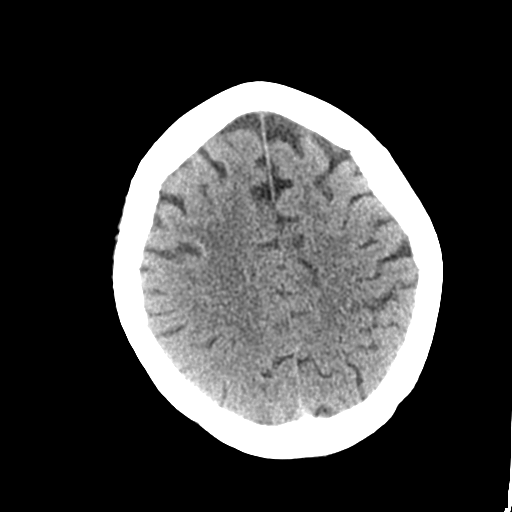
[im 23/32  brain]
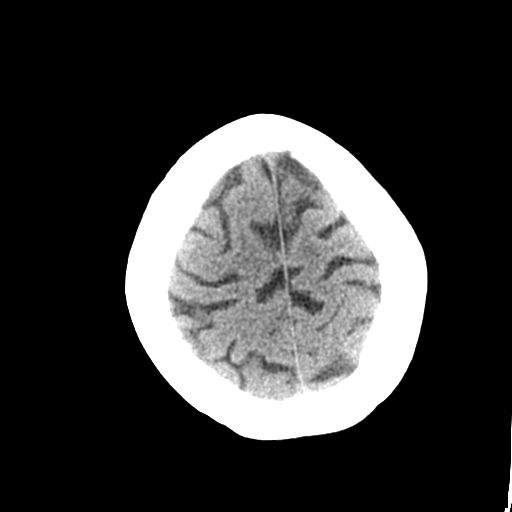
[im 26/32  brain]
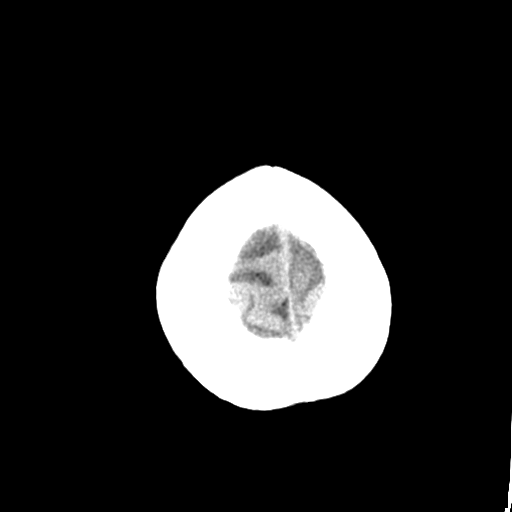
[im 29/32  brain]
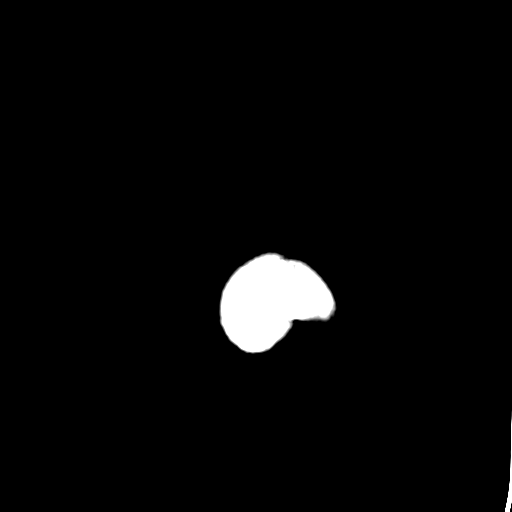
[im 29/32  bone]
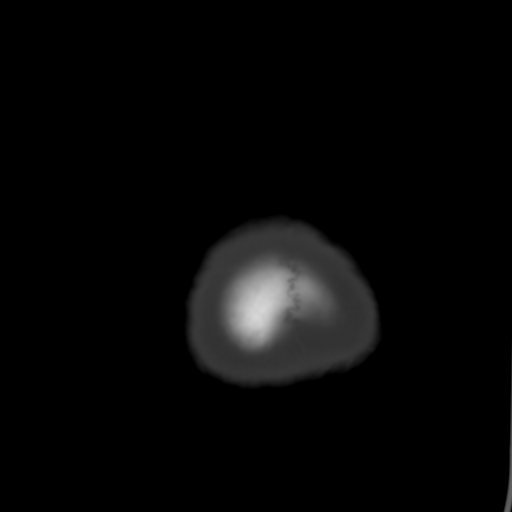

[Series 4: coronal soft tissue · coronal · 0.33mm/px · 3 of 66 slices shown]
[im 22/66  brain]
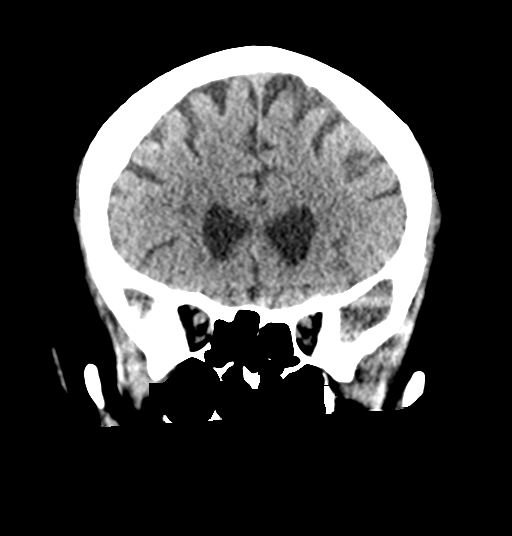
[im 29/66  brain]
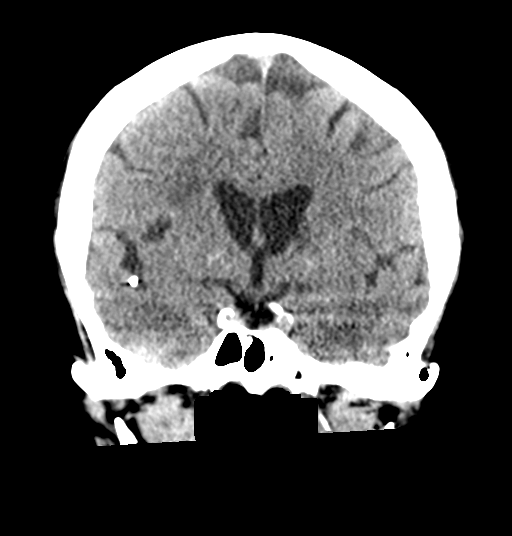
[im 37/66  brain]
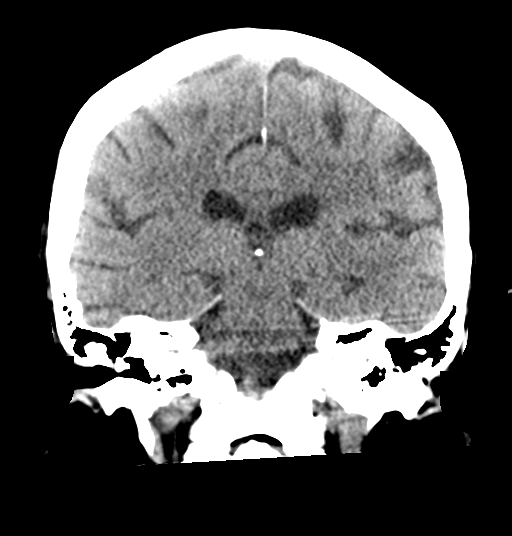

[Series 5: sagittal soft tissue · sagittal · 0.34mm/px · 3 of 54 slices shown]
[im 18/54  brain]
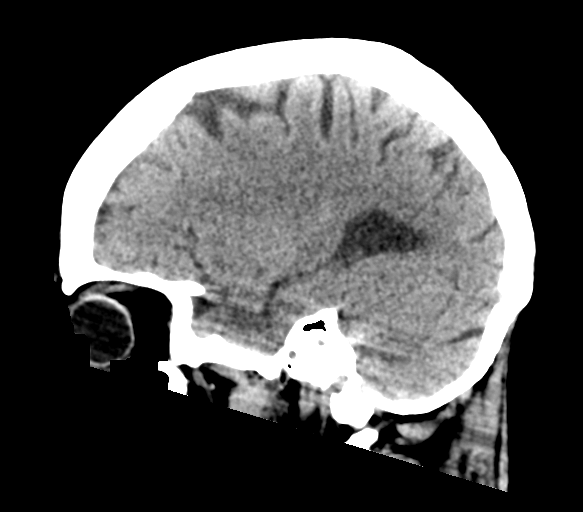
[im 27/54  brain]
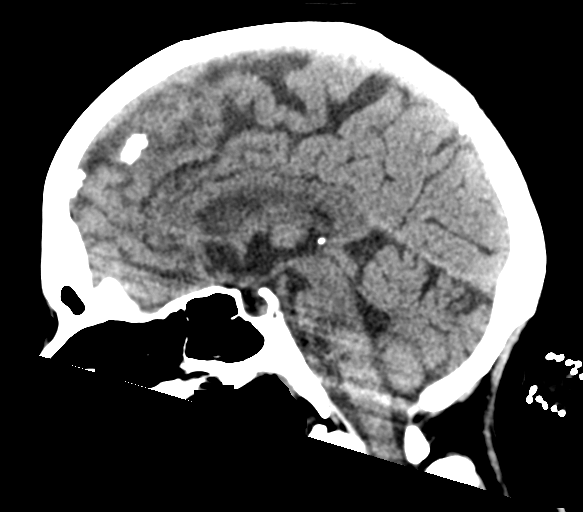
[im 36/54  brain]
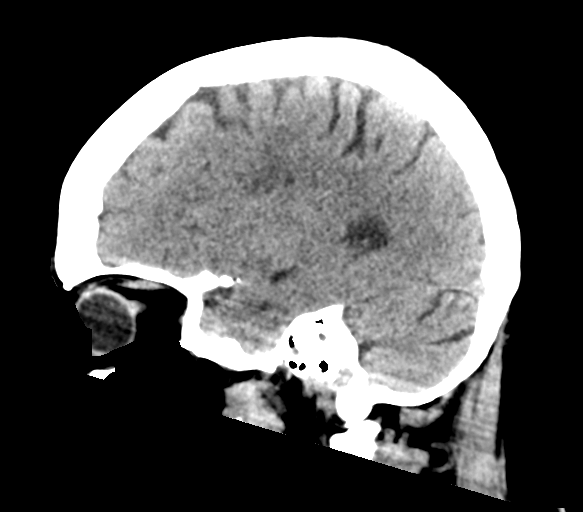

[15 of 47 positions shown; findings below may reference images not displayed]

FINDINGS: CT HEAD FINDINGS

Brain: Hypodensity right frontal white matter compatible with recent
infarct. This was present previously. Negative for new or acute
infarct. No hemorrhage or mass. Chronic infarct left anterior basal
ganglia. Negative for hydrocephalus.

Vascular: Interval placement of right MCA stent in the right M1/ M2
region. Negative for hyperdense vessel

Skull: Negative

Sinuses/Orbits: Negative

Other: Contusion lateral to the right orbit which was not present
previously. This is presumably posttraumatic.

CT CERVICAL SPINE FINDINGS

Alignment: Normal alignment with straightening of the cervical
lordosis.

Skull base and vertebrae: Negative for fracture

Soft tissues and spinal canal: Negative

Disc levels: Disc degeneration and spondylosis C3 through C7. Mild
spinal stenosis C4-5, C5-6, C6-7.

Upper chest: Lung apices clear bilaterally.

Other: None
IMPRESSION: 1. No acute intracranial abnormality. Recent infarct right frontal
white matter. Recent placement of right MCA stent
2. Soft tissue contusion lateral the right orbit presumably from
recent injury.
3. Negative for cervical spine fracture.

## 2021-06-11 IMAGING — MR MR HEAD W/O CM
12 series · 44 of 48 positions shown · non-contrast
Comparison: Comparison made with prior CT from [DATE] as well
as multiple previous exams.

CLINICAL DATA: Initial evaluation for recurrent syncope, recent
infarction, status post angioplasty and stenting.

EXAM:
MRI HEAD WITHOUT CONTRAST
TECHNIQUE: Multiplanar, multiecho pulse sequences of the brain and surrounding
structures were obtained without intravenous contrast.

[Series 5: ax dwi_tracew · axial · 3.0mm · 0.65mm/px · z∈[-115,+24]mm · 4 of 44 slices shown]
[im 1/44]
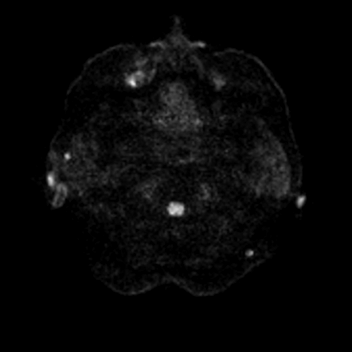
[im 15/44]
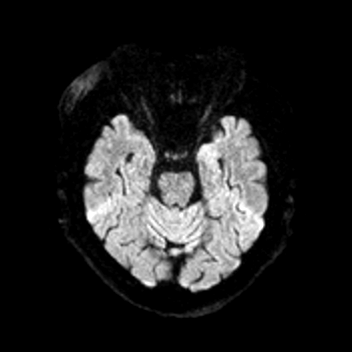
[im 29/44]
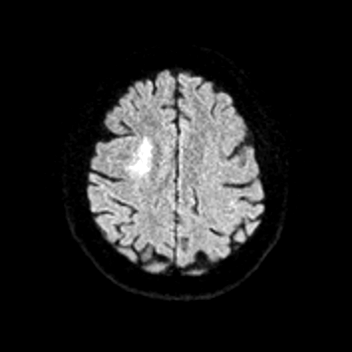
[im 44/44]
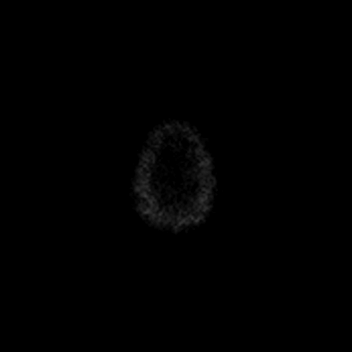

[Series 6: ax dwi_adc · axial · 3.0mm · 0.65mm/px · z∈[-115,+24]mm · 3 of 44 slices shown]
[im 1/44]
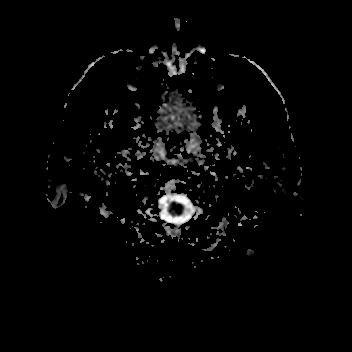
[im 22/44]
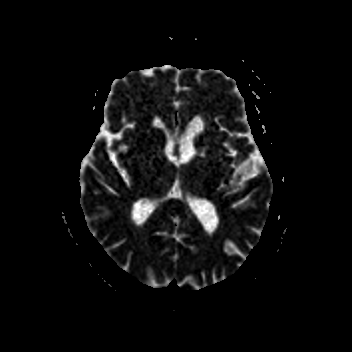
[im 44/44]
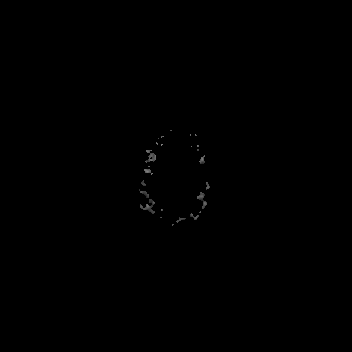

[Series 7: cor dwi_tracew · coronal · 5.0mm · 0.60mm/px · 2 of 32 slices shown]
[im 1/32]
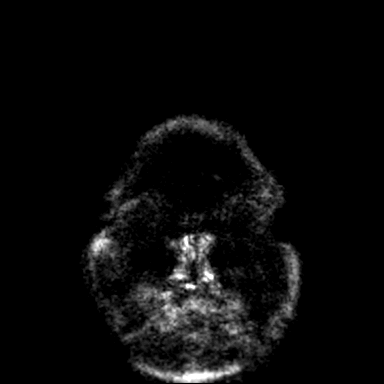
[im 32/32]
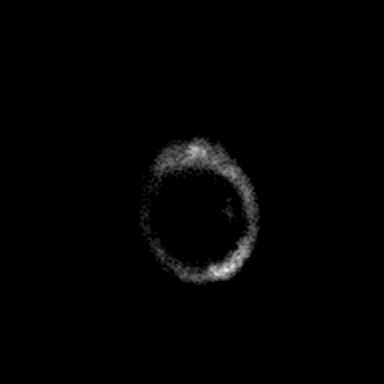

[Series 8: cor dwi_adc · coronal · 5.0mm · 0.60mm/px · 2 of 32 slices shown]
[im 1/32]
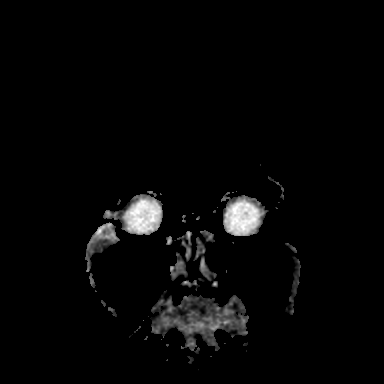
[im 32/32]
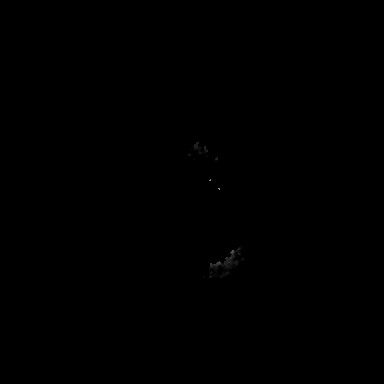

[Series 9: T1 · sagittal · 5.0mm · 0.62mm/px · 2 of 20 slices shown (1 of 2)]
[im 1/20]
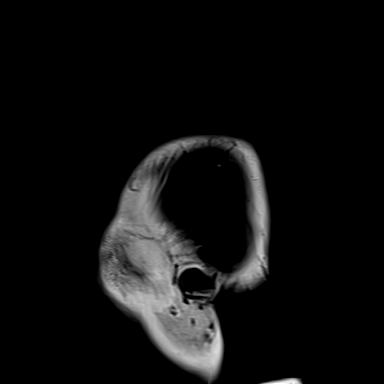
[im 20/20]
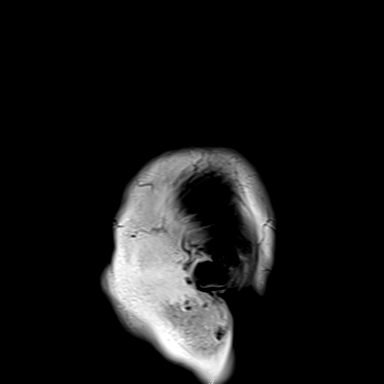

[Series 10: T2 · axial · 5.0mm · 0.53mm/px · z∈[-109,+21]mm · 2 of 23 slices shown (1 of 2)]
[im 1/23]
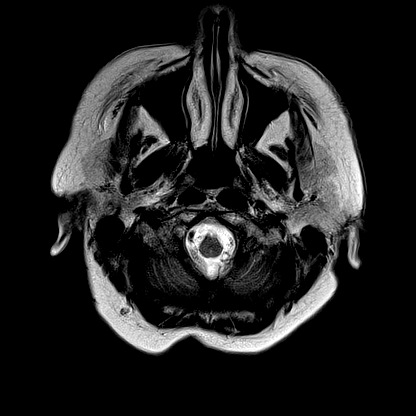
[im 23/23]
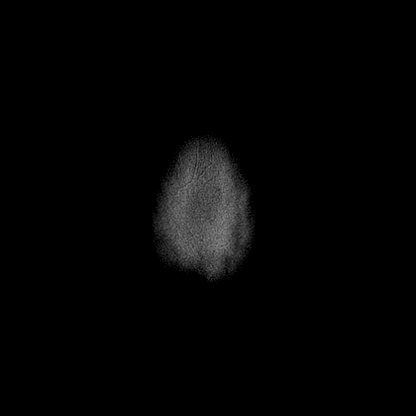

[Series 11: mag_images · axial · 3.0mm · 0.90mm/px · z∈[-131,+42]mm · 5 of 60 slices shown]
[im 1/60]
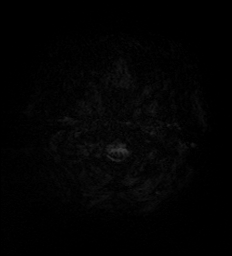
[im 15/60]
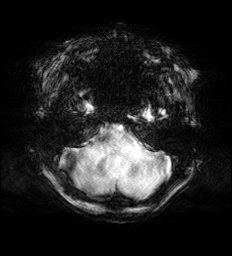
[im 30/60]
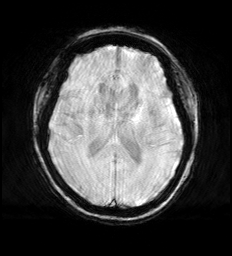
[im 45/60]
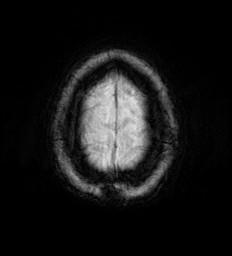
[im 60/60]
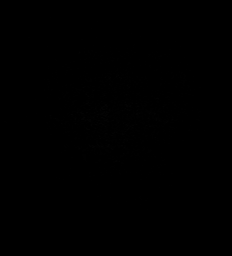

[Series 12: pha_images · axial · 3.0mm · 0.90mm/px · z∈[-131,+39]mm · 4 of 56 slices shown]
[im 1/56]
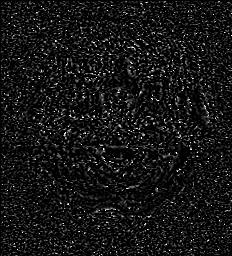
[im 19/56]
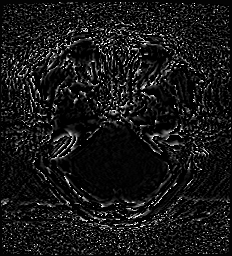
[im 37/56]
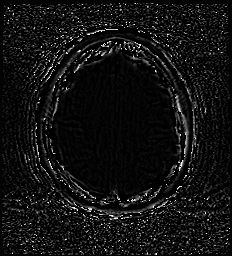
[im 56/56]
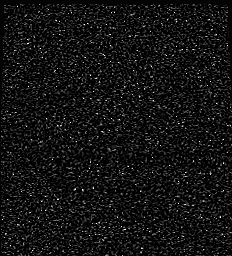

[Series 13: swi_images · axial · 3.0mm · 0.90mm/px · z∈[-131,+42]mm · 5 of 60 slices shown]
[im 1/60]
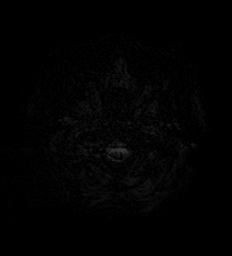
[im 15/60]
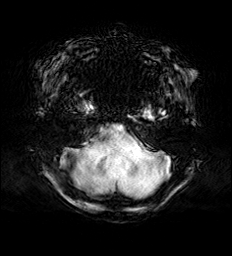
[im 30/60]
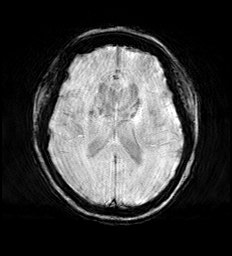
[im 45/60]
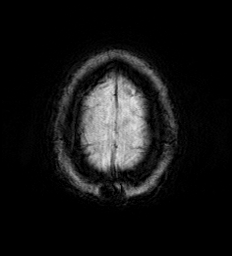
[im 60/60]
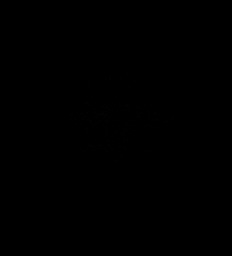

[Series 15: FLAIR · axial · 3.0mm · 0.53mm/px · z∈[-123,+35]mm · 4 of 55 slices shown]
[im 1/55]
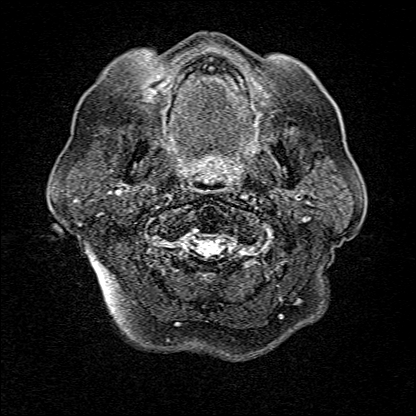
[im 19/55]
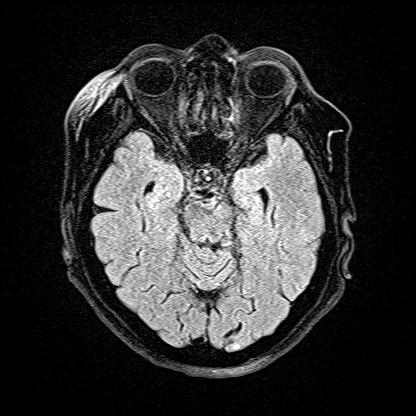
[im 37/55]
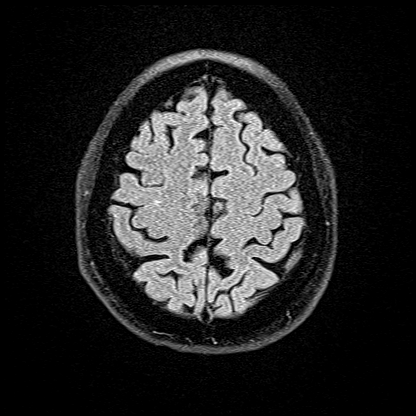
[im 55/55]
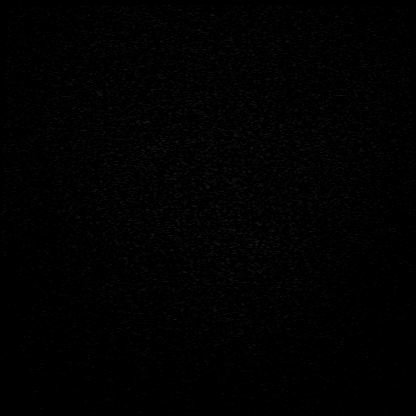

[Series 16: T1 · axial · 1.0mm · 0.98mm/px · z∈[-133,+39]mm · 9 of 174 slices shown (2 of 2)]
[im 1/174]
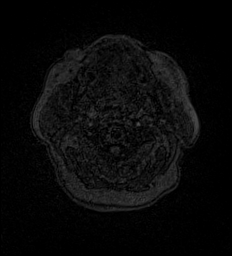
[im 15/174]
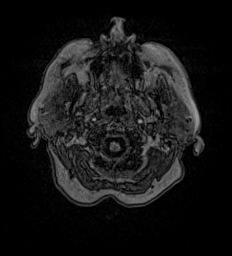
[im 29/174]
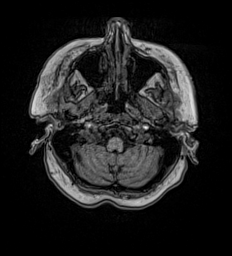
[im 58/174]
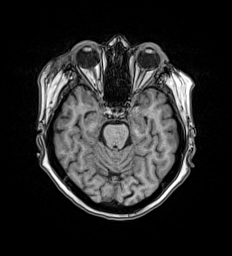
[im 73/174]
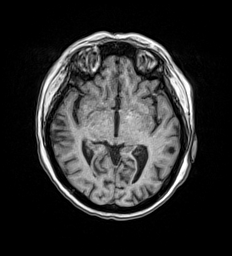
[im 101/174]
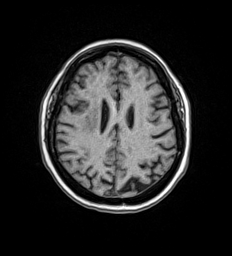
[im 116/174]
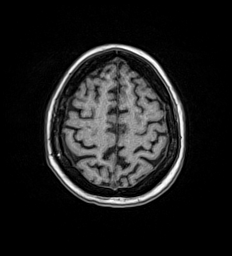
[im 145/174]
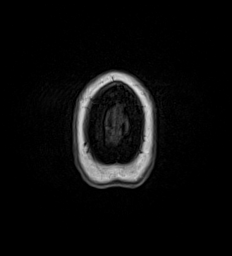
[im 174/174]
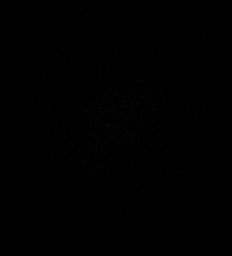

[Series 17: T2 · coronal · 5.0mm · 0.57mm/px · 2 of 26 slices shown (2 of 2)]
[im 1/26]
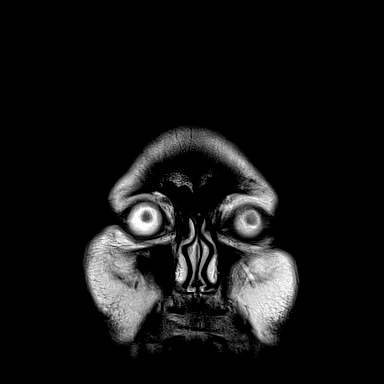
[im 26/26]
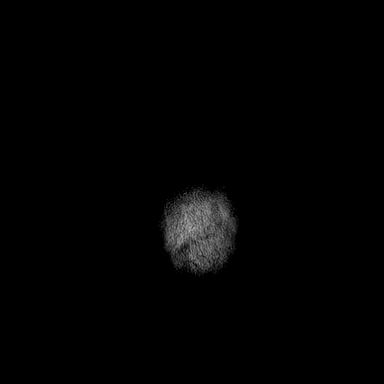

[44 of 48 positions shown; findings below may reference images not displayed]

FINDINGS: Brain: Cerebral volume stable, and remains within normal limits for
age. Small remote infarcts involving the left basal ganglia and left
occipital cortex again seen, stable. Mild chronic microvascular
ischemic disease noted involving the pons. Probable small remote
lacunar infarct at the right thalamus.

There has been continued interval evolution of recently identified
subacute infarct involving the right frontal corona radiata, overall
similar in size and distribution from previous. No evidence for
hemorrhagic transformation or significant mass effect. There are 2
additional subtle foci of diffusion abnormality involving the right
thalamic capsular region measuring up to 6 mm, not definitely seen
on prior MRI, but also subacute in appearance. No associated
hemorrhage.

No other restricted diffusion to suggest acute or interval
infarction. Gray-white matter differentiation otherwise maintained.
No acute intracranial hemorrhage.

No mass lesion, midline shift or mass effect. No hydrocephalus or
extra-axial fluid collection. Pituitary gland suprasellar region
normal. Midline structures intact.

Vascular: Susceptibility artifact related to a vascular stent at the
region of the right MCA bifurcation. Major intracranial vascular
flow voids are otherwise maintained and stable.

Skull and upper cervical spine: Craniocervical junction within
normal limits. Degenerative spondylosis noted at C2-3 without
high-grade stenosis. Bone marrow signal intensity normal. No scalp
soft tissue abnormality.

Sinuses/Orbits: Globes and orbital soft tissues within normal
limits. Paranasal sinuses are clear. No mastoid effusion. Inner ear
structures grossly normal.

Other: None.
IMPRESSION: 1. Continued interval evolution of recently identified subacute
right frontal corona radiata infarct. No associated hemorrhage or
significant mass effect.
2. Two additional subtle subcentimeter foci of diffusion abnormality
involving the right thalamocapsular region, not definitely seen on
prior MRI, but also subacute in appearance. No associated
hemorrhage.
3. Otherwise stable appearance of the brain with no other acute
intracranial abnormality. Chronic infarcts involving the left basal
ganglia, left occipital cortex, and right thalamus.

## 2021-06-11 IMAGING — CT CT CERVICAL SPINE W/O CM
3 of 4 series · 12 of 33 positions shown, 14 images · non-contrast
Comparison: CT head [DATE]

CLINICAL DATA: Mental status change.  Near syncope.

EXAM:
CT HEAD WITHOUT CONTRAST
CT CERVICAL SPINE WITHOUT CONTRAST
TECHNIQUE: Multidetector CT imaging of the head and cervical spine was
performed following the standard protocol without intravenous
contrast. Multiplanar CT image reconstructions of the cervical spine
were also generated.

[Series 6: orthogonal bone · axial · 0.26mm/px · z∈[+37,+156]mm · 4 of 97 slices shown, 5 images]
[im 17/97  soft-tissue]
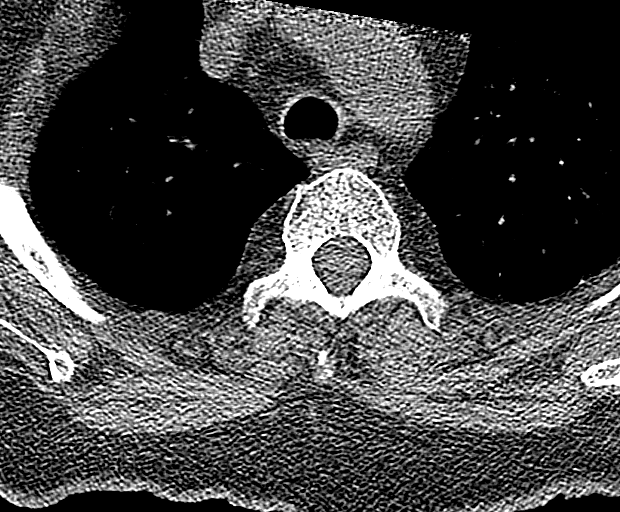
[im 17/97  bone]
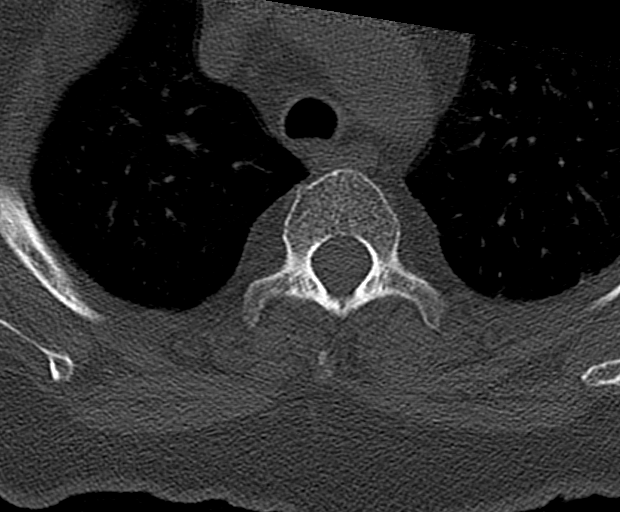
[im 33/97  bone]
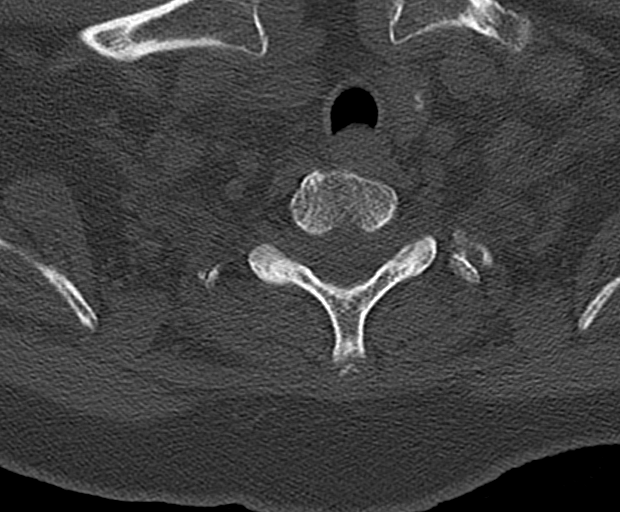
[im 65/97  bone]
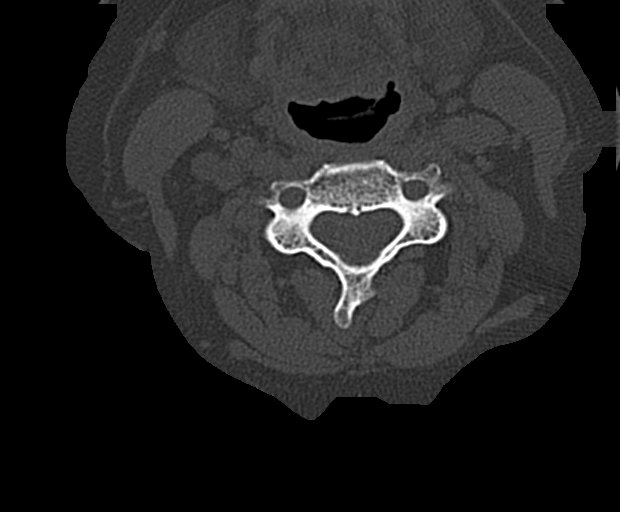
[im 81/97  bone]
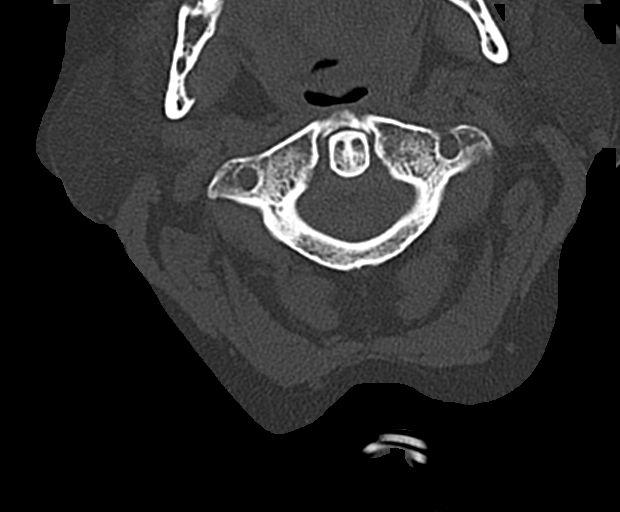

[Series 7: sagittal bone · sagittal · 0.27mm/px · 5 of 68 slices shown, 6 images]
[im 23/68  bone]
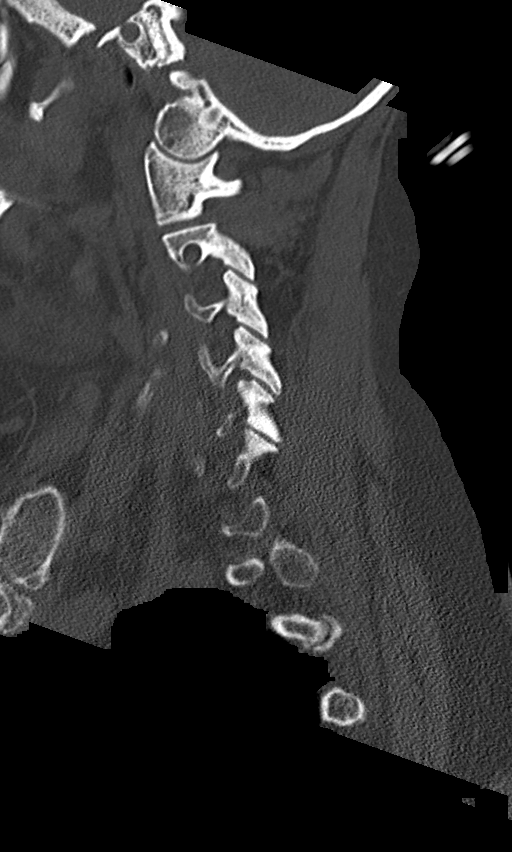
[im 28/68  bone]
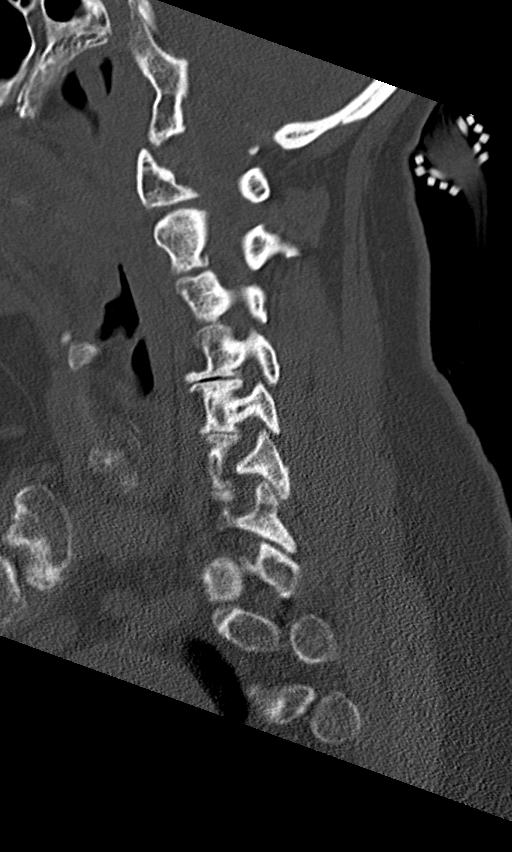
[im 34/68  soft-tissue]
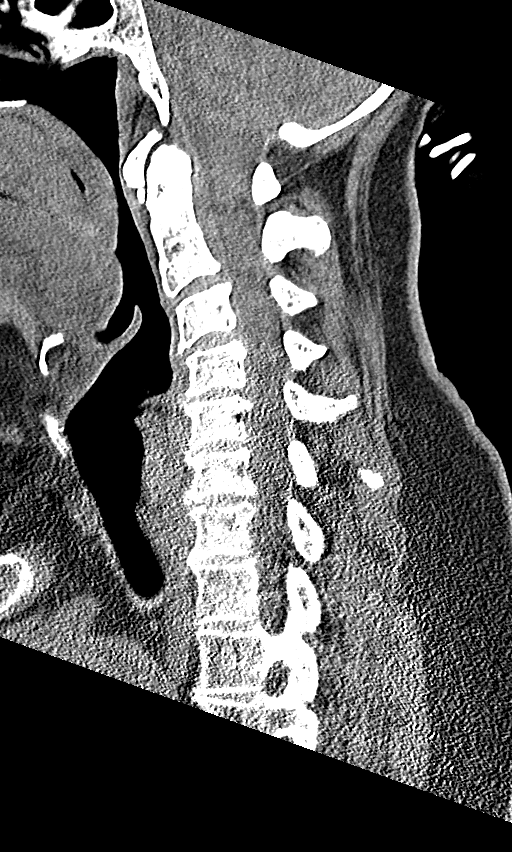
[im 34/68  bone]
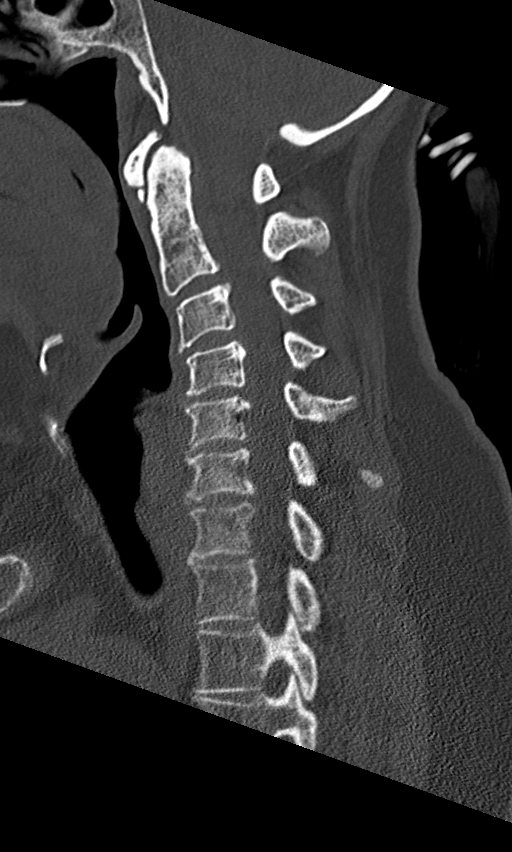
[im 40/68  bone]
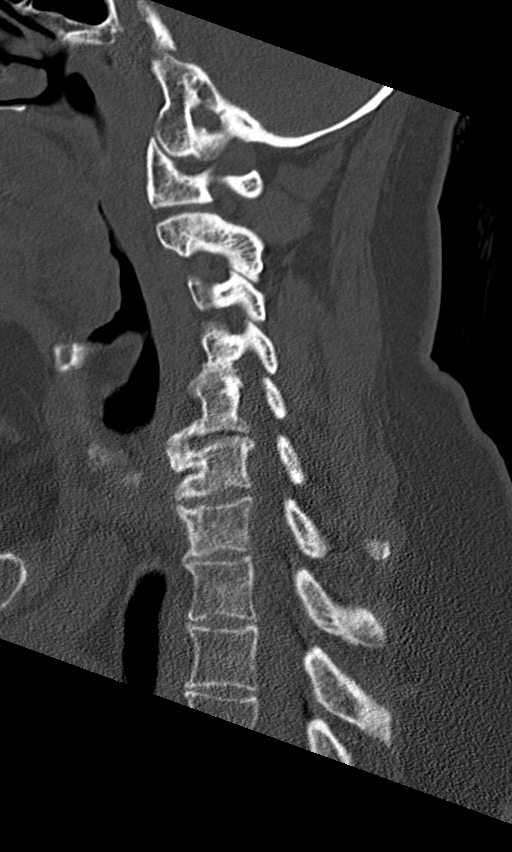
[im 45/68  bone]
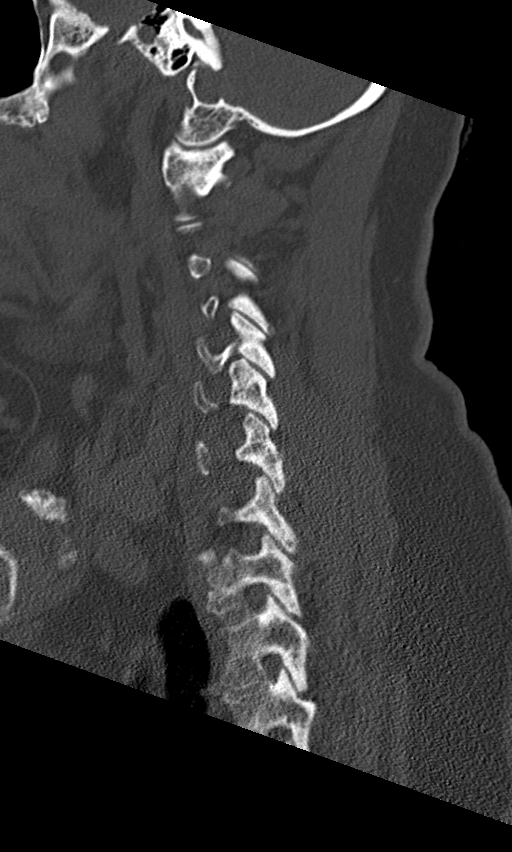

[Series 8: coronal bone · coronal · 0.33mm/px · 3 of 62 slices shown]
[im 13/62  bone]
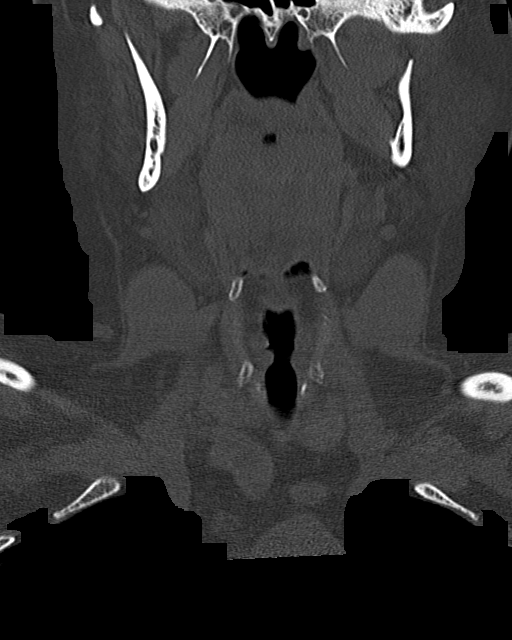
[im 25/62  bone]
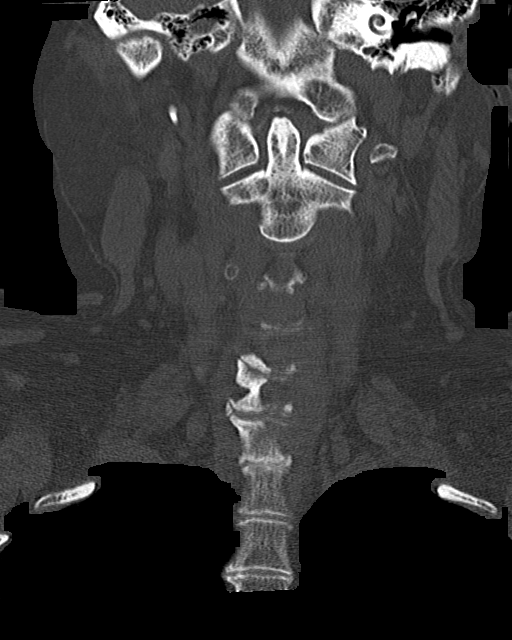
[im 37/62  bone]
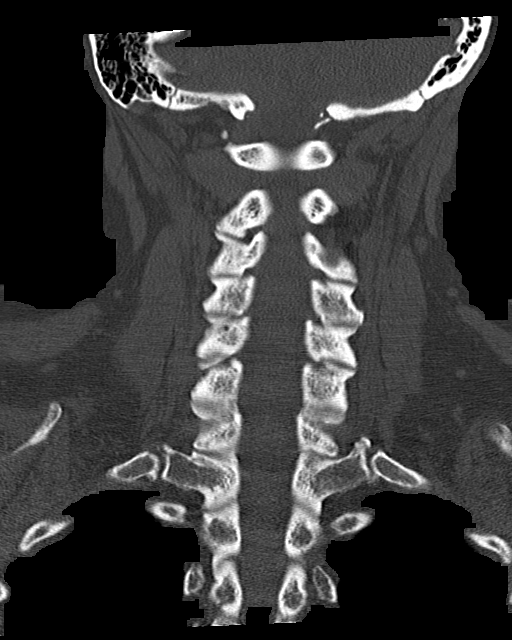

[12 of 33 positions shown; findings below may reference images not displayed]

FINDINGS: CT HEAD FINDINGS

Brain: Hypodensity right frontal white matter compatible with recent
infarct. This was present previously. Negative for new or acute
infarct. No hemorrhage or mass. Chronic infarct left anterior basal
ganglia. Negative for hydrocephalus.

Vascular: Interval placement of right MCA stent in the right M1/ M2
region. Negative for hyperdense vessel

Skull: Negative

Sinuses/Orbits: Negative

Other: Contusion lateral to the right orbit which was not present
previously. This is presumably posttraumatic.

CT CERVICAL SPINE FINDINGS

Alignment: Normal alignment with straightening of the cervical
lordosis.

Skull base and vertebrae: Negative for fracture

Soft tissues and spinal canal: Negative

Disc levels: Disc degeneration and spondylosis C3 through C7. Mild
spinal stenosis C4-5, C5-6, C6-7.

Upper chest: Lung apices clear bilaterally.

Other: None
IMPRESSION: 1. No acute intracranial abnormality. Recent infarct right frontal
white matter. Recent placement of right MCA stent
2. Soft tissue contusion lateral the right orbit presumably from
recent injury.
3. Negative for cervical spine fracture.

## 2021-06-11 IMAGING — CR DG CHEST 2V
1 series · 2 of 2 positions shown · non-contrast
Comparison: [DATE]

CLINICAL DATA: Syncope, hypertension, diabetes

EXAM:
CHEST - 2 VIEW

[Series 1: dg chest 2 view · 0.14mm/px · 2 of 2 slices shown]
[im 1/2]
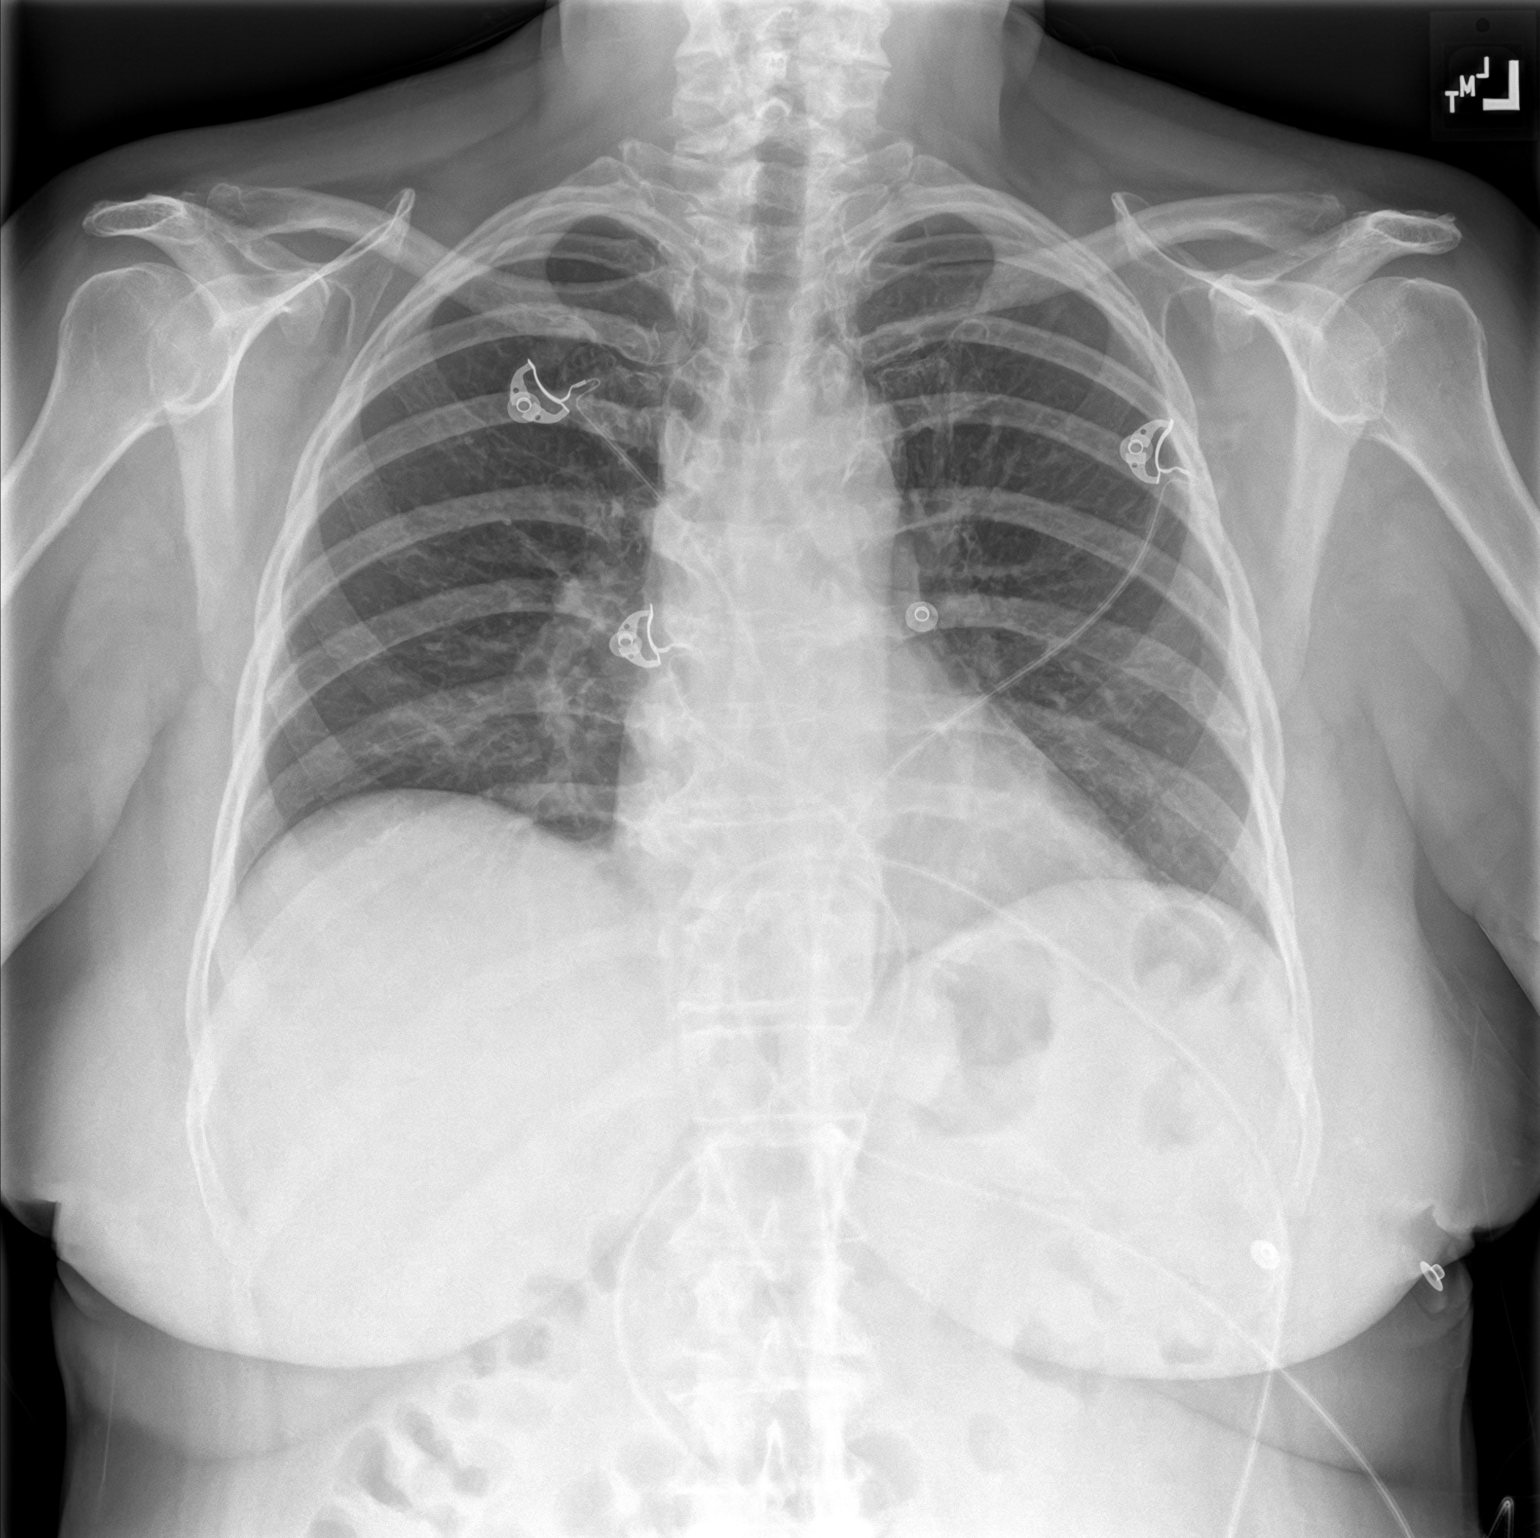
[im 2/2]
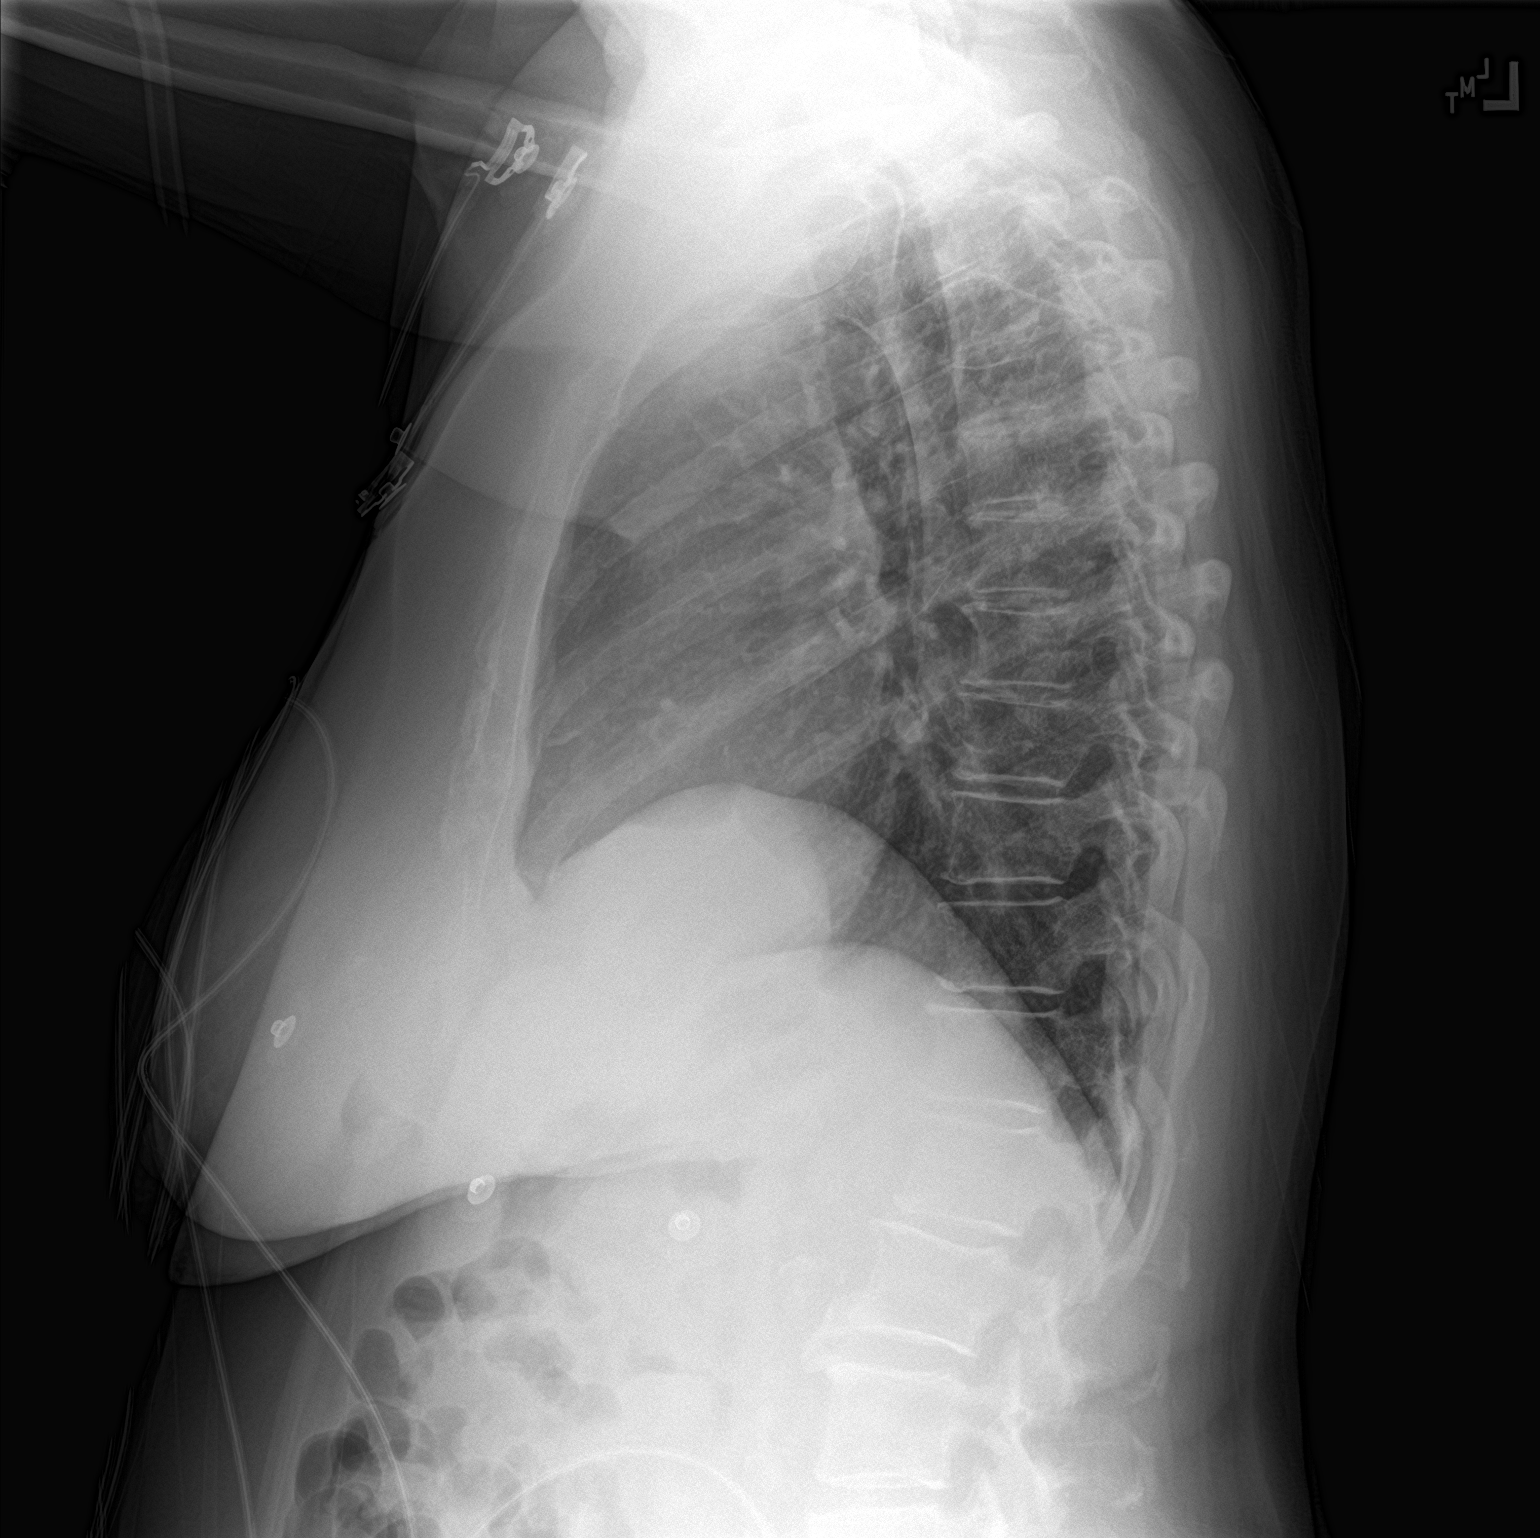

[2 of 2 positions shown; findings below may reference images not displayed]

FINDINGS: Frontal and lateral views of the chest demonstrate an unremarkable
cardiac silhouette. No airspace disease, effusion, or pneumothorax.
No acute bony abnormalities.
IMPRESSION: 1. No acute intrathoracic process.

## 2021-06-11 MED ORDER — MELATONIN 5 MG PO TABS
2.5000 mg | ORAL_TABLET | Freq: Every day | ORAL | Status: DC
  Administered 2021-06-11 – 2021-06-12 (×2): 2.5 mg via ORAL
  Filled 2021-06-11: qty 0.5
  Filled 2021-06-11: qty 1
  Filled 2021-06-11: qty 0.5

## 2021-06-11 MED ORDER — INSULIN ASPART 100 UNIT/ML IJ SOLN: 0.0000 [IU] | Freq: Every day | INTRAMUSCULAR | Status: DC

## 2021-06-11 MED ORDER — LEVOTHYROXINE SODIUM 25 MCG PO TABS
125.0000 ug | ORAL_TABLET | Freq: Every day | ORAL | Status: DC
  Administered 2021-06-12 – 2021-06-13 (×2): 125 ug via ORAL
  Filled 2021-06-11 (×2): qty 1

## 2021-06-11 MED ORDER — CLOPIDOGREL BISULFATE 75 MG PO TABS
75.0000 mg | ORAL_TABLET | Freq: Every day | ORAL | Status: DC
  Administered 2021-06-12 – 2021-06-13 (×2): 75 mg via ORAL
  Filled 2021-06-11 (×2): qty 1

## 2021-06-11 MED ORDER — ACETAMINOPHEN 650 MG RE SUPP: 650.0000 mg | Freq: Four times a day (QID) | RECTAL | Status: DC | PRN

## 2021-06-11 MED ORDER — ACETAMINOPHEN 325 MG PO TABS: 650.0000 mg | ORAL_TABLET | Freq: Four times a day (QID) | ORAL | Status: DC | PRN

## 2021-06-11 MED ORDER — SODIUM CHLORIDE 0.9 % IV BOLUS
500.0000 mL | Freq: Once | INTRAVENOUS | Status: AC
Start: 2021-06-11 — End: 2021-06-11
  Administered 2021-06-11: 500 mL via INTRAVENOUS

## 2021-06-11 MED ORDER — ATORVASTATIN CALCIUM 20 MG PO TABS
80.0000 mg | ORAL_TABLET | Freq: Every day | ORAL | Status: DC
  Administered 2021-06-11 – 2021-06-12 (×2): 80 mg via ORAL
  Filled 2021-06-11 (×2): qty 4

## 2021-06-11 MED ORDER — CITALOPRAM HYDROBROMIDE 20 MG PO TABS
10.0000 mg | ORAL_TABLET | Freq: Every day | ORAL | Status: DC
  Administered 2021-06-12 – 2021-06-13 (×2): 10 mg via ORAL
  Filled 2021-06-11 (×2): qty 1

## 2021-06-11 MED ORDER — INSULIN ASPART 100 UNIT/ML IJ SOLN
0.0000 [IU] | Freq: Three times a day (TID) | INTRAMUSCULAR | Status: DC
  Administered 2021-06-12: 12:00:00 5 [IU] via SUBCUTANEOUS
  Administered 2021-06-13: 09:00:00 2 [IU] via SUBCUTANEOUS
  Administered 2021-06-13: 13:00:00 3 [IU] via SUBCUTANEOUS
  Filled 2021-06-11 (×3): qty 1

## 2021-06-11 MED ORDER — SODIUM CHLORIDE 0.9% FLUSH
3.0000 mL | Freq: Two times a day (BID) | INTRAVENOUS | Status: DC
  Administered 2021-06-11: 3 mL via INTRAVENOUS

## 2021-06-11 MED ORDER — LORAZEPAM 2 MG/ML IJ SOLN: 1.0000 mg | INTRAMUSCULAR | Status: AC | PRN

## 2021-06-11 MED ORDER — ASPIRIN EC 81 MG PO TBEC
81.0000 mg | DELAYED_RELEASE_TABLET | Freq: Every day | ORAL | Status: DC
  Administered 2021-06-12 – 2021-06-13 (×2): 81 mg via ORAL
  Filled 2021-06-11 (×2): qty 1

## 2021-06-11 MED ORDER — ONDANSETRON HCL 4 MG/2ML IJ SOLN: 4.0000 mg | Freq: Four times a day (QID) | INTRAMUSCULAR | Status: DC | PRN

## 2021-06-11 MED ORDER — HYDRALAZINE HCL 20 MG/ML IJ SOLN: 5.0000 mg | INTRAMUSCULAR | Status: DC | PRN

## 2021-06-11 MED ORDER — ONDANSETRON HCL 4 MG PO TABS: 4.0000 mg | ORAL_TABLET | Freq: Four times a day (QID) | ORAL | Status: DC | PRN

## 2021-06-11 NOTE — ED Notes (Signed)
Pt ambulatory to and from RR, RN stand by assist.

## 2021-06-11 NOTE — ED Notes (Signed)
RN stand by assist for pt ambulatory to and from RR.

## 2021-06-11 NOTE — ED Notes (Signed)
Admitting at bedside 

## 2021-06-11 NOTE — ED Notes (Signed)
ED Provider at bedside. 

## 2021-06-11 NOTE — ED Notes (Signed)
Pt to XR at this time

## 2021-06-11 NOTE — H&P (Signed)
History and Physical   Madison Davidson:[id number] DOB: 06-11-1951 DOA: 06/11/2021  [facility]P: [facility] [place]  Outpatient Specialists: Dr. Stevphen Meuse clinic cardiology Patient coming from: [facility] in the EMS  I have personally briefly reviewed patient's old medical records in [place].  Chief Concern: Syncope  HPI: Madison Davidson is a 70 y.o. female with medical history significant for recent right MCA stroke, hypertension, non-insulin-dependent diabetes mellitus, hypothyroid, hyperlipidemia, presents emergency department for chief concerns of syncopal event at [facility].  At bedside, she is able to tell me her full name, her age, the location of hospital and the current calendar year is 2022.  She reports that right before she passed out, she had a headache. The headache was on the back and crown of her head. She denies dysphagia, lost of urine or bowel incontinence, diarrhea, chest pain, shortness of breath, abdominal pain, dysuria, diarrhea.   She reports this similar to her previous episode when she had a stroke in June 2022.  I spoke with daughter over the phone, and daughter thinks the patient may have had her days confused and therefore not compliant fully with her medications unintentionally.  Daughter does not believe that patient can live on her own.  Daughters desires long-term placement.  Social history: She lives at home by herself. She denies tobacco, etoh, recreational drug use. She is retired and formerly worked Oncologist.   Vaccination history: She is vaccinated for covid-19, two doses, unknown brand  ROS: Constitutional: + weight change(weight gain), no fever ENT/Mouth: no sore throat, no rhinorrhea Eyes: no eye pain, no vision changes Cardiovascular: no chest pain, no dyspnea,  no edema, no palpitations Respiratory: no cough, no sputum, no wheezing Gastrointestinal: no nausea, no vomiting, no diarrhea, no  constipation Genitourinary: no urinary incontinence, no dysuria, no hematuria Musculoskeletal: no arthralgias, no myalgias Skin: no skin lesions, no pruritus, Neuro: + weakness, + loss of consciousness, + syncope Psych: no anxiety, no depression, no decrease appetite Heme/Lymph: no bruising, no bleeding  ED Course: Discussed with ED provider, patient requiring hospitalization for syncopal event.  Vitals in the emergency department was remarkable for temperature 98.3, respiration rate of 16, heart rate 68, blood pressure 109/64, SPO2 98% on room air.  Labs in the emergency department was remarkable for BUN 27, serum creatinine 1.07, nonfasting blood glucose 151, WBC 12.1, hemoglobin 10.2, platelets 285, troponin is 8, with repeat of 10.  UA showed trace leukocytes. EDP ordered normal saline 500 mL bolus.  CT of the head was remarkable for recurrent right frontal MCA stroke.  Right orbit soft tissue contusion.  Assessment/Plan  Principal Problem:   Syncope Active Problems:   Benign essential hypertension   Hyperlipidemia, mixed   DM II (diabetes mellitus, type II), controlled (HCC)   CVA (cerebral vascular accident) ([place])   # Syncope-differentials include new stroke, seizure is a possible differential # Stroke-like symptoms - CT head without contrast read as: No acute intracranial abnormality.  Recent infarct right frontal white matter.  Recent placement of right MCA stent.  Soft tissue contusion lateral to the right orbit presumably from recent injury.  Negative for cervical spine fracture. - MRI of the brain, EEG ordered - Would recommend a.m. team to consult neurology for further recommendation - Complete echo has not been ordered due to recent echo on 05/09/2021: Ejection fraction 60 to 65%, normal left ventricular function.  Left ventricular diastolic parameters are intermediate.  Right ventricular systolic function is normal. - Fasting lipid  and A1c ordered - B12 on 05/12/2021  was 183  - Permissive hypertension per neurology recommendations - Frequent neuro vascular checks - N.p.o. pending swallow study - PT, OT in the AM  - Fall precaution, seizure precautions - Ativan 1 mg IV, every 4 hours as needed for seizures, 1 day of - I recommend discharging provider, to consider Holter monitor on patient for 5 to 7 days if the above work-up is unremarkable - MedSurg, observation, telemetry for 1 day - TOC consult for long-term facility  # Hyperlipidemia 80 mg nightly resumed # Pyuria-POA, patient denies urgency, frequency, dysuria, no indications for antibiotics at this # Depression/anxiety-Celexa 10 mg daily # History of CVA, large vessel, right MCA status post stent placement-aspirin 81 mg daily, Plavix 75 mg daily resumed  # Right orbit contusion secondary to fall  # Non-insulin-dependent diabetes mellitus-holding home sulfonylurea - On discharge summary on 05/15/2021: Metformin 1000 mg twice daily - Insulin SSI with at bedtime coverage ordered  # Hypothyroid-levothyroxine 125 mcg # Hypertension-lisinopril 20 mg daily daily did not not resume, permissive - hypertension as above with hydralazine 5 mg IV every 4 hours as needed for SBP greater than 220, 2 days ordered  Chart reviewed.   Hospitalization from 05/11/2021 to 05/15/2021: Patient was admitted for acute right frontal lobe infarct with symptomatic right MCA stenosis status post IR stenting in the right distal M1 MCA on 05/11/2021 - Patient with prescribed aspirin 81 mg, Plavix 75 mg daily  DVT prophylaxis: scds Code Status: full code  Diet: heart healthy/carb modified Family Communication: updated with daughter, Collene Mares [phone] Disposition Plan: pending clinical course Consults called: none at this time  Admission status: MedSurg, observation, telemetry  Past Medical History:  Diagnosis Date   Cancer ([place])    skin   Coronary artery disease    Diabetes mellitus without complication ([place])     Hypertension    Stroke [facility])    Past Surgical History:  Procedure Laterality Date   ABDOMINAL HYSTERECTOMY     IR ANGIO INTRA EXTRACRAN SEL INTERNAL CAROTID BILAT MOD SED  05/11/2021   IR ANGIO VERTEBRAL SEL VERTEBRAL UNI R MOD SED  05/11/2021   IR ANGIOGRAM EXTREMITY LEFT  05/11/2021   IR CT HEAD LTD  05/11/2021   IR INTRA CRAN STENT  05/11/2021   IR INTRA CRAN STENT  05/12/2021   IR US GUIDE VASC ACCESS RIGHT  05/11/2021   RADIOLOGY WITH ANESTHESIA N/A 05/11/2021   Procedure: IR WITH ANESTHESIA - INTRACRANIAL STENT;  Surgeon: Pedro Earls, MD;  Location: [place];  Service: Radiology;  Laterality: N/A;   Social History:  reports that she has never smoked. She has never used smokeless tobacco. She reports that she does not drink alcohol and does not use drugs.  No Known Allergies Family History  Problem Relation Age of Onset   Heart disease Mother    Heart disease Father    Family history: Family history reviewed and not pertinent  Prior to Admission medications   Medication Sig Start Date End Date Taking? Authorizing Provider  acetaminophen (TYLENOL) 325 MG tablet Take 2 tablets (650 mg total) by mouth every 6 (six) hours as needed for mild pain, fever or headache. 05/24/21   Angiulli, Lavon Paganini, PA-C  acidophilus (RISAQUAD) CAPS capsule Take 2 capsules by mouth 3 (three) times daily. 05/24/21   Angiulli, Lavon Paganini, PA-C  aspirin EC 81 MG EC tablet Take 1 tablet (81 mg total) by mouth daily. Swallow whole. 05/26/21  Angiulli, Lavon Paganini, PA-C  atorvastatin (LIPITOR) 20 MG tablet Take 4 tablets (80 mg total) by mouth at bedtime. 05/24/21   Angiulli, Lavon Paganini, PA-C  citalopram (CELEXA) 10 MG tablet Take 1 tablet (10 mg total) by mouth daily. 05/24/21   Angiulli, Lavon Paganini, PA-C  clopidogrel (PLAVIX) 75 MG tablet Take 1 tablet (75 mg total) by mouth daily. 05/24/21 06/23/21  Angiulli, Lavon Paganini, PA-C  glipiZIDE (GLUCOTROL) 5 MG tablet Take 1 tablet (5 mg total) by mouth 2 (two) times daily  before a meal. 05/24/21   Angiulli, Lavon Paganini, PA-C  levothyroxine (SYNTHROID) 125 MCG tablet Take 1 tablet (125 mcg total) by mouth daily at 6 (six) AM. 05/24/21   Angiulli, Lavon Paganini, PA-C  lisinopril (ZESTRIL) 20 MG tablet Take 1 tablet (20 mg total) by mouth daily. 05/24/21   Angiulli, Lavon Paganini, PA-C  metFORMIN (GLUCOPHAGE) 1000 MG tablet Take 1 tablet (1,000 mg total) by mouth 2 (two) times daily with a meal. 05/24/21   Angiulli, Lavon Paganini, PA-C  polycarbophil (FIBERCON) 625 MG tablet Take 1 tablet (625 mg total) by mouth 2 (two) times daily. 05/24/21   Angiulli, Lavon Paganini, PA-C  Vitamin D, Ergocalciferol, (DRISDOL) 1.25 MG (50000 UNIT) CAPS capsule Take 1 capsule (50,000 Units total) by mouth every 7 (seven) days. 05/30/21   Cathlyn Parsons, PA-C   Physical Exam: Vitals:   06/11/21 1830 06/11/21 1920 06/11/21 2000 06/11/21 2100  BP: (!) 129/109 (!) 146/68 105/64 (!) 162/57  Pulse: 64 75 68 69  Resp: 18 18 14 17   Temp:      TempSrc:      SpO2: 100% 97% 100% 97%  Weight:      Height:       Constitutional: appears older than chronological age, NAD, calm, comfortable Eyes: PERRL, lids and conjunctivae normal ENMT: Mucous membranes are moist. Posterior pharynx clear of any exudate or lesions. Age-appropriate dentition. Hearing appropriate Neck: normal, supple, no masses, no thyromegaly Respiratory: clear to auscultation bilaterally, no wheezing, no crackles. Normal respiratory effort. No accessory muscle use.  Cardiovascular: Regular rate and rhythm, no murmurs / rubs / gallops. No extremity edema. 2+ pedal pulses. No carotid bruits.  Abdomen: no tenderness, no masses palpated, no hepatosplenomegaly. Bowel sounds positive.  Musculoskeletal: no clubbing / cyanosis. No joint deformity upper and lower extremities. Good ROM, no contractures, no atrophy. Normal muscle tone.  Skin: no rashes, lesions, ulcers. No induration. Right eye contusion Neurologic: Sensation intact. Strength 5/5 in all 4.   Psychiatric: Normal judgment and insight. Alert and oriented x 3.  Depressed mood.  Flat affect.  EKG: independently reviewed, showing sinus rhythm with rate of 68, QTc 397  Chest x-ray on Admission: I personally reviewed and I agree with radiologist reading as below.  CT Head Wo Contrast  Result Date: 06/11/2021 CLINICAL DATA:  Mental status change.  Near syncope. EXAM: CT HEAD WITHOUT CONTRAST CT CERVICAL SPINE WITHOUT CONTRAST TECHNIQUE: Multidetector CT imaging of the head and cervical spine was performed following the standard protocol without intravenous contrast. Multiplanar CT image reconstructions of the cervical spine were also generated. COMPARISON:  CT head 06/08/2021 FINDINGS: CT HEAD FINDINGS Brain: Hypodensity right frontal white matter compatible with recent infarct. This was present previously. Negative for new or acute infarct. No hemorrhage or mass. Chronic infarct left anterior basal ganglia. Negative for hydrocephalus. Vascular: Interval placement of right MCA stent in the right M1/ M2 region. Negative for hyperdense vessel Skull: Negative Sinuses/Orbits: Negative Other: Contusion lateral to  the right orbit which was not present previously. This is presumably posttraumatic. CT CERVICAL SPINE FINDINGS Alignment: Normal alignment with straightening of the cervical lordosis. Skull base and vertebrae: Negative for fracture Soft tissues and spinal canal: Negative Disc levels: Disc degeneration and spondylosis C3 through C7. Mild spinal stenosis C4-5, C5-6, C6-7. Upper chest: Lung apices clear bilaterally. Other: None IMPRESSION: 1. No acute intracranial abnormality. Recent infarct right frontal white matter. Recent placement of right MCA stent 2. Soft tissue contusion lateral the right orbit presumably from recent injury. 3. Negative for cervical spine fracture. Electronically Signed   By: Franchot Gallo M.D.   On: 06/11/2021 19:16   CT Cervical Spine Wo Contrast  Result Date:  06/11/2021 CLINICAL DATA:  Mental status change.  Near syncope. EXAM: CT HEAD WITHOUT CONTRAST CT CERVICAL SPINE WITHOUT CONTRAST TECHNIQUE: Multidetector CT imaging of the head and cervical spine was performed following the standard protocol without intravenous contrast. Multiplanar CT image reconstructions of the cervical spine were also generated. COMPARISON:  CT head 06/08/2021 FINDINGS: CT HEAD FINDINGS Brain: Hypodensity right frontal white matter compatible with recent infarct. This was present previously. Negative for new or acute infarct. No hemorrhage or mass. Chronic infarct left anterior basal ganglia. Negative for hydrocephalus. Vascular: Interval placement of right MCA stent in the right M1/ M2 region. Negative for hyperdense vessel Skull: Negative Sinuses/Orbits: Negative Other: Contusion lateral to the right orbit which was not present previously. This is presumably posttraumatic. CT CERVICAL SPINE FINDINGS Alignment: Normal alignment with straightening of the cervical lordosis. Skull base and vertebrae: Negative for fracture Soft tissues and spinal canal: Negative Disc levels: Disc degeneration and spondylosis C3 through C7. Mild spinal stenosis C4-5, C5-6, C6-7. Upper chest: Lung apices clear bilaterally. Other: None IMPRESSION: 1. No acute intracranial abnormality. Recent infarct right frontal white matter. Recent placement of right MCA stent 2. Soft tissue contusion lateral the right orbit presumably from recent injury. 3. Negative for cervical spine fracture. Electronically Signed   By: Franchot Gallo M.D.   On: 06/11/2021 19:16    Labs on Admission: I have personally reviewed following labs  CBC: Recent Labs  Lab 06/11/21 1810  WBC 12.1*  HGB 10.2*  HCT 31.7*  MCV 81.1  PLT 211   Basic Metabolic Panel: Recent Labs  Lab 06/11/21 1810  NA 136  K 4.3  CL 107  CO2 21*  GLUCOSE 151*  BUN 27*  CREATININE 1.07*  CALCIUM 9.0   GFR: Estimated Creatinine Clearance: 47.7 mL/min  (A) (by C-G formula based on SCr of 1.07 mg/dL (H)).  CBG: Recent Labs  Lab 06/11/21 1741  GLUCAP 154*   Urine analysis:    Component Value Date/Time   COLORURINE YELLOW (A) 06/11/2021 1810   APPEARANCEUR CLEAR (A) 06/11/2021 1810   LABSPEC 1.009 06/11/2021 1810   PHURINE 5.0 06/11/2021 1810   GLUCOSEU NEGATIVE 06/11/2021 1810   HGBUR NEGATIVE 06/11/2021 1810   BILIRUBINUR NEGATIVE 06/11/2021 1810   KETONESUR NEGATIVE 06/11/2021 1810   PROTEINUR NEGATIVE 06/11/2021 1810   NITRITE NEGATIVE 06/11/2021 1810   LEUKOCYTESUR TRACE (A) 06/11/2021 1810   Dr. Tobie Poet [facility]  If 7PM-7AM, please contact overnight-coverage provider If 7AM-7PM, please contact day coverage provider [email or web address]  06/11/2021, 9:41 PM

## 2021-06-11 NOTE — ED Notes (Signed)
Report received from Crystal, RN

## 2021-06-11 NOTE — ED Provider Notes (Signed)
[facility] Emergency Department Provider Note ____________________________________________   Event Date/Time   First MD Initiated Contact with Patient 06/11/21 1759     (approximate)  I have reviewed the triage vital signs and the nursing notes.   HISTORY  Chief Complaint Near Syncope    HPI Madison Davidson is a 70 y.o. female with PMH as noted below including CAD, diabetes, hypertension, and stroke who presents with a syncopal episode, acute onset today while she was in [place] shopping.  The patient states that she does not remember any symptoms before she passed out.  She denies any weakness or lightheadedness and states that she just passed out and fell.  She hit the right side of her head by her right eye.  She denies any other injuries.  She has no pain right now and states that she just feels tired.  She states that this happened to her about a month ago but otherwise she has not had any similar episodes of passing out.  She denies any recent medication changes.  She states that she felt fine earlier today.   Past Medical History:  Diagnosis Date   Cancer 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   Intracranial atherosclerosis 05/11/2021   Cerebral thrombosis with cerebral infarction 05/11/2021   Slurred speech 05/08/2021   Hyperlipidemia, mixed 05/08/2021   DM II (diabetes mellitus, type II), controlled ([place]) 05/08/2021   AKI (acute kidney injury) ([place]) 05/08/2021   Anemia 05/08/2021   CVA (cerebral vascular accident) ([place]) 05/08/2021   Benign essential hypertension 08/09/2015    Past Surgical History:  Procedure Laterality Date   ABDOMINAL HYSTERECTOMY     IR ANGIO INTRA EXTRACRAN SEL INTERNAL CAROTID BILAT MOD SED  05/11/2021   IR ANGIO  VERTEBRAL SEL VERTEBRAL UNI R MOD SED  05/11/2021   IR ANGIOGRAM EXTREMITY LEFT  05/11/2021   IR CT HEAD LTD  05/11/2021   IR INTRA CRAN STENT  05/11/2021   IR INTRA CRAN STENT  05/12/2021   IR US GUIDE VASC ACCESS RIGHT  05/11/2021   RADIOLOGY WITH ANESTHESIA N/A 05/11/2021   Procedure: IR WITH ANESTHESIA - INTRACRANIAL STENT;  Surgeon: Pedro Earls, MD;  Location: [place];  Service: Radiology;  Laterality: N/A;    Prior to Admission medications   Medication Sig Start Date End Date Taking? Authorizing Provider  acetaminophen (TYLENOL) 325 MG tablet Take 2 tablets (650 mg total) by mouth every 6 (six) hours as needed for mild pain, fever or headache. 05/24/21   Angiulli, Lavon Paganini, PA-C  acidophilus (RISAQUAD) CAPS capsule Take 2 capsules by mouth 3 (three) times daily. 05/24/21   Angiulli, Lavon Paganini, PA-C  aspirin EC 81 MG EC tablet Take 1 tablet (81 mg total) by mouth daily. Swallow whole. 05/26/21   Angiulli, Lavon Paganini, PA-C  atorvastatin (LIPITOR) 20 MG tablet Take 4 tablets (80 mg total) by mouth at bedtime. 05/24/21   Angiulli, Lavon Paganini, PA-C  citalopram (CELEXA) 10 MG tablet Take 1 tablet (10 mg total) by mouth daily. 05/24/21   Angiulli, Lavon Paganini, PA-C  clopidogrel (PLAVIX) 75 MG tablet Take 1 tablet (75 mg total) by mouth daily. 05/24/21 06/23/21  Angiulli, Lavon Paganini, PA-C  glipiZIDE (GLUCOTROL) 5  MG tablet Take 1 tablet (5 mg total) by mouth 2 (two) times daily before a meal. 05/24/21   Angiulli, Lavon Paganini, PA-C  levothyroxine (SYNTHROID) 125 MCG tablet Take 1 tablet (125 mcg total) by mouth daily at 6 (six) AM. 05/24/21   Angiulli, Lavon Paganini, PA-C  lisinopril (ZESTRIL) 20 MG tablet Take 1 tablet (20 mg total) by mouth daily. 05/24/21   Angiulli, Lavon Paganini, PA-C  metFORMIN (GLUCOPHAGE) 1000 MG tablet Take 1 tablet (1,000 mg total) by mouth 2 (two) times daily with a meal. 05/24/21   Angiulli, Lavon Paganini, PA-C  polycarbophil (FIBERCON) 625 MG tablet Take 1 tablet (625 mg total) by mouth 2 (two)  times daily. 05/24/21   Angiulli, Lavon Paganini, PA-C  Vitamin D, Ergocalciferol, (DRISDOL) 1.25 MG (50000 UNIT) CAPS capsule Take 1 capsule (50,000 Units total) by mouth every 7 (seven) days. 05/30/21   Angiulli, Lavon Paganini, PA-C    Allergies Patient has no known allergies.  Family History  Problem Relation Age of Onset   Heart disease Mother    Heart disease Father     Social History Social History   Tobacco Use   Smoking status: Never   Smokeless tobacco: Never  Substance Use Topics   Alcohol use: No   Drug use: No    Review of Systems  Constitutional: No fever. Eyes: No redness. ENT: No sore throat.  No neck pain. Cardiovascular: Denies chest pain. Respiratory: Denies shortness of breath. Gastrointestinal: No vomiting or diarrhea.  Genitourinary: Negative for dysuria.  Musculoskeletal: Negative for back pain. Skin: Negative for rash. Neurological: Negative for headaches, focal weakness or numbness.   ____________________________________________   PHYSICAL EXAM:  VITAL SIGNS: ED Triage Vitals  Enc Vitals Group     BP 06/11/21 1740 (!) 102/41     Pulse Rate 06/11/21 1740 61     Resp 06/11/21 1740 20     Temp 06/11/21 1745 98.3 F (36.8 C)     Temp Source 06/11/21 1745 Oral     SpO2 06/11/21 1740 99 %     Weight 06/11/21 1741 180 lb (81.6 kg)     Height 06/11/21 1741 5\' 2"  (1.575 m)     Head Circumference --      Peak Flow --      Pain Score --      Pain Loc --      Pain Edu? --      Excl. in [place]? --     Constitutional: Alert and oriented. Well appearing and in no acute distress. Eyes: Conjunctivae are normal.  Head: Right periorbital ecchymosis and swelling laterally. Nose: No congestion/rhinnorhea. Mouth/Throat: Mucous membranes are moist.   Neck: Normal range of motion.  No midline cervical spinal tenderness. Cardiovascular: Normal rate, regular rhythm. Good peripheral circulation. Respiratory: Normal respiratory effort.  No retractions.   Gastrointestinal: No distention.  Musculoskeletal: No lower extremity edema.  Extremities warm and well perfused.  Neurologic:  Normal speech and language.  Motor and sensory intact in all extremities.  Normal coordination with no ataxia on finger-to-nose.  No facial droop.  No pronator drift. Skin:  Skin is warm and dry. No rash noted. Psychiatric: Mood and affect are normal. Speech and behavior are normal.  ____________________________________________   LABS (all labs ordered are listed, but only abnormal results are displayed)  Labs Reviewed  BASIC METABOLIC PANEL - Abnormal; Notable for the following components:      Result Value   CO2 21 (*)    Glucose, Bld  151 (*)    BUN 27 (*)    Creatinine, Ser 1.07 (*)    GFR, Estimated 56 (*)    All other components within normal limits  CBC - Abnormal; Notable for the following components:   WBC 12.1 (*)    Hemoglobin 10.2 (*)    HCT 31.7 (*)    All other components within normal limits  URINALYSIS, COMPLETE (UACMP) WITH MICROSCOPIC - Abnormal; Notable for the following components:   Color, Urine YELLOW (*)    APPearance CLEAR (*)    Leukocytes,Ua TRACE (*)    Bacteria, UA RARE (*)    All other components within normal limits  CBG MONITORING, ED - Abnormal; Notable for the following components:   Glucose-Capillary 154 (*)    All other components within normal limits  RESP PANEL BY RT-PCR (FLU A&B, COVID) ARPGX2  COMPREHENSIVE METABOLIC PANEL  CBC  APTT  PROTIME-INR  TROPONIN I (HIGH SENSITIVITY)  TROPONIN I (HIGH SENSITIVITY)   ____________________________________________  EKG  ED ECG REPORT I, Arta Silence, the attending physician, personally viewed and interpreted this ECG.  Date: 06/11/2021 EKG Time: 1749 Rate: 68 Rhythm: normal sinus rhythm QRS Axis: normal Intervals: normal ST/T Wave abnormalities: Nonspecific T wave abnormalities Narrative Interpretation: Nonspecific abnormalities with no evidence of  acute ischemia  ____________________________________________  RADIOLOGY  CT head: No ICH or other acute abnormality CT cervical spine: No acute fracture  ____________________________________________   PROCEDURES  Procedure(s) performed: No  Procedures  Critical Care performed: No ____________________________________________   INITIAL IMPRESSION / ASSESSMENT AND PLAN / ED COURSE  Pertinent labs & imaging results that were available during my care of the patient were reviewed by me and considered in my medical decision making (see chart for details).   70 year old female with PMH as noted above including CAD, diabetes, hypertension, and CVA presents after a syncopal episode while in [place].  She denies any prodromal symptoms, and reports 1 prior episode like this about a month ago.  I reviewed the past medical records in epic.  The patient was just admitted on 6/13 after a fall with loss of consciousness.  At that time she had transient left-sided weakness and slurred speech.  Work-up subsequently showed acute to early subacute right frontal infarct.  She underwent distal M1 stenting and was put on aspirin and Plavix.  She was transferred to rehab on 6/20.  On exam the patient is overall well-appearing.  Her vital signs are normal except for borderline low BP.  Neurologic exam is normal.  The physical exam is otherwise unremarkable.  Differential includes vasovagal episode, dehydration, other metabolic etiology, deconditioning related to her recent stroke and rehab, UTI or other infection, or less likely possible cardiac cause.  We will obtain CT head and cervical spine due to the fall and trauma, lab work-up, cardiac enzymes, urinalysis, and reassess.  ----------------------------------------- 9:33 PM on 06/11/2021 -----------------------------------------  Lab work-up is unremarkable except for mild leukocytosis.  The urinalysis is normal.  CT head and C-spine are  negative.  Given syncope without any prodrome I recommended to the patient that we admit her for further monitoring and she agrees.  She states that she still feels somewhat "slow."  I consulted Dr. Tobie Poet from the hospitalist service for admission.  ____________________________________________   FINAL CLINICAL IMPRESSION(S) / ED DIAGNOSES  Final diagnoses:  Syncope, unspecified syncope type      NEW MEDICATIONS STARTED DURING THIS VISIT:  New Prescriptions   No medications on file  Note:  This document was prepared using Dragon voice recognition software and may include unintentional dictation errors.    Arta Silence, MD 06/11/21 2134

## 2021-06-11 NOTE — ED Notes (Signed)
Pt keys placed in a biohazard bag and given to daughter at bedside.

## 2021-06-11 NOTE — ED Notes (Signed)
Pt encouraged to use RR at this time.

## 2021-06-11 NOTE — ED Notes (Signed)
Madison Davidson [place] ( daughter)  Cell [phone] call with any updates.

## 2021-06-11 NOTE — ED Notes (Signed)
Pt assisted to and from RR at this time. UA specimen collected.

## 2021-06-11 NOTE — ED Triage Notes (Addendum)
Pt via EMS from [place] pt was in [place] and pt had a near syncopal episode, pt states she felt lightheaded. Denies LOC. Denies pain Pt is alert and oriented to self, situation but slow to respond.   Per EMS, pt did had an unwitnessed fall and is on Plavix.

## 2021-06-11 NOTE — ED Triage Notes (Signed)
FSBS 162. Pt in via EMS from [place] with c/o a syncopal episode. Pt with hx of the same.

## 2021-06-11 NOTE — ED Notes (Signed)
Daughter at bedside. Advised pt has been doing great since discharge on 05/29/21 from previous stroke. RN called daughter at 613pm and advised she was here in the ER. Daughter advised "we have been looking for her since 12noon". Pt has HX of dementia and alzheimer's.

## 2021-06-11 NOTE — ED Notes (Signed)
Patient transported to MRI 

## 2021-06-12 ENCOUNTER — Encounter: Payer: Self-pay | Admitting: Internal Medicine

## 2021-06-12 ENCOUNTER — Observation Stay: Payer: Medicare Other

## 2021-06-12 DIAGNOSIS — I6389 Other cerebral infarction: Secondary | ICD-10-CM | POA: Diagnosis present

## 2021-06-12 DIAGNOSIS — E039 Hypothyroidism, unspecified: Secondary | ICD-10-CM | POA: Diagnosis present

## 2021-06-12 DIAGNOSIS — W1839XA Other fall on same level, initial encounter: Secondary | ICD-10-CM | POA: Diagnosis present

## 2021-06-12 DIAGNOSIS — I2510 Atherosclerotic heart disease of native coronary artery without angina pectoris: Secondary | ICD-10-CM | POA: Diagnosis present

## 2021-06-12 DIAGNOSIS — I63411 Cerebral infarction due to embolism of right middle cerebral artery: Secondary | ICD-10-CM

## 2021-06-12 DIAGNOSIS — Z7984 Long term (current) use of oral hypoglycemic drugs: Secondary | ICD-10-CM | POA: Diagnosis not present

## 2021-06-12 DIAGNOSIS — Z7982 Long term (current) use of aspirin: Secondary | ICD-10-CM | POA: Diagnosis not present

## 2021-06-12 DIAGNOSIS — E1165 Type 2 diabetes mellitus with hyperglycemia: Secondary | ICD-10-CM

## 2021-06-12 DIAGNOSIS — Z79899 Other long term (current) drug therapy: Secondary | ICD-10-CM | POA: Diagnosis not present

## 2021-06-12 DIAGNOSIS — G9389 Other specified disorders of brain: Secondary | ICD-10-CM | POA: Diagnosis not present

## 2021-06-12 DIAGNOSIS — F32A Depression, unspecified: Secondary | ICD-10-CM | POA: Diagnosis present

## 2021-06-12 DIAGNOSIS — S0511XA Contusion of eyeball and orbital tissues, right eye, initial encounter: Secondary | ICD-10-CM | POA: Diagnosis present

## 2021-06-12 DIAGNOSIS — Y9259 Other trade areas as the place of occurrence of the external cause: Secondary | ICD-10-CM | POA: Diagnosis not present

## 2021-06-12 DIAGNOSIS — I10 Essential (primary) hypertension: Secondary | ICD-10-CM | POA: Diagnosis present

## 2021-06-12 DIAGNOSIS — R55 Syncope and collapse: Secondary | ICD-10-CM | POA: Diagnosis present

## 2021-06-12 DIAGNOSIS — Z90710 Acquired absence of both cervix and uterus: Secondary | ICD-10-CM | POA: Diagnosis not present

## 2021-06-12 DIAGNOSIS — D509 Iron deficiency anemia, unspecified: Secondary | ICD-10-CM | POA: Diagnosis present

## 2021-06-12 DIAGNOSIS — Z7902 Long term (current) use of antithrombotics/antiplatelets: Secondary | ICD-10-CM | POA: Diagnosis not present

## 2021-06-12 DIAGNOSIS — R401 Stupor: Secondary | ICD-10-CM | POA: Diagnosis not present

## 2021-06-12 DIAGNOSIS — Z79890 Hormone replacement therapy (postmenopausal): Secondary | ICD-10-CM | POA: Diagnosis not present

## 2021-06-12 DIAGNOSIS — E119 Type 2 diabetes mellitus without complications: Secondary | ICD-10-CM | POA: Diagnosis present

## 2021-06-12 DIAGNOSIS — Z8249 Family history of ischemic heart disease and other diseases of the circulatory system: Secondary | ICD-10-CM | POA: Diagnosis not present

## 2021-06-12 DIAGNOSIS — Z20822 Contact with and (suspected) exposure to covid-19: Secondary | ICD-10-CM | POA: Diagnosis present

## 2021-06-12 DIAGNOSIS — E782 Mixed hyperlipidemia: Secondary | ICD-10-CM | POA: Diagnosis present

## 2021-06-12 DIAGNOSIS — Z8673 Personal history of transient ischemic attack (TIA), and cerebral infarction without residual deficits: Secondary | ICD-10-CM | POA: Diagnosis not present

## 2021-06-12 DIAGNOSIS — E538 Deficiency of other specified B group vitamins: Secondary | ICD-10-CM | POA: Diagnosis present

## 2021-06-12 LAB — COMPREHENSIVE METABOLIC PANEL
ALT: 17 U/L (ref 0–44)
AST: 16 U/L (ref 15–41)
Albumin: 3.2 g/dL — ABNORMAL LOW (ref 3.5–5.0)
Alkaline Phosphatase: 58 U/L (ref 38–126)
Anion gap: 6 (ref 5–15)
BUN: 22 mg/dL (ref 8–23)
CO2: 24 mmol/L (ref 22–32)
Calcium: 8.5 mg/dL — ABNORMAL LOW (ref 8.9–10.3)
Chloride: 106 mmol/L (ref 98–111)
Creatinine, Ser: 0.74 mg/dL (ref 0.44–1.00)
GFR, Estimated: >60 mL/min (ref >60)
Glucose, Bld: 65 mg/dL — ABNORMAL LOW (ref 70–99)
Potassium: 3.5 mmol/L (ref 3.5–5.1)
Sodium: 136 mmol/L (ref 135–145)
Total Bilirubin: 0.5 mg/dL (ref 0.3–1.2)
Total Protein: 6.2 g/dL — ABNORMAL LOW (ref 6.5–8.1)

## 2021-06-12 LAB — GLUCOSE, CAPILLARY
Glucose-Capillary: 74 mg/dL (ref 70–99)
Glucose-Capillary: 225 mg/dL — ABNORMAL HIGH (ref 70–99)
Glucose-Capillary: 71 mg/dL (ref 70–99)
Glucose-Capillary: 113 mg/dL — ABNORMAL HIGH (ref 70–99)

## 2021-06-12 LAB — CBC
HCT: 30.7 % — ABNORMAL LOW (ref 36.0–46.0)
Hemoglobin: 10.1 g/dL — ABNORMAL LOW (ref 12.0–15.0)
MCH: 26.2 pg (ref 26.0–34.0)
MCHC: 32.9 g/dL (ref 30.0–36.0)
MCV: 79.7 fL — ABNORMAL LOW (ref 80.0–100.0)
Platelets: 259 10*3/uL (ref 150–400)
RBC: 3.85 MIL/uL — ABNORMAL LOW (ref 3.87–5.11)
RDW: 14.6 % (ref 11.5–15.5)
WBC: 9.2 10*3/uL (ref 4.0–10.5)
nRBC: 0 % (ref 0.0–0.2)

## 2021-06-12 LAB — VITAMIN B12: Vitamin B-12: 127 pg/mL — ABNORMAL LOW (ref 180–914)

## 2021-06-12 LAB — PROTIME-INR
INR: 1.1 (ref 0.8–1.2)
Prothrombin Time: 13.7 seconds (ref 11.4–15.2)

## 2021-06-12 LAB — APTT: aPTT: 26 seconds (ref 24–36)

## 2021-06-12 IMAGING — CT CT ANGIO HEAD-NECK (W OR W/O PERF)
1 of 10 series · 6 of 35 positions shown · IV contrast (omnipaque)
Comparison: Brain MRI [DATE]. Head CT [DATE]. Brain MRI
[DATE]. CT angiogram head [DATE].

CLINICAL DATA: Stroke, follow-up.

EXAM:
CT ANGIOGRAPHY HEAD AND NECK
TECHNIQUE: Multidetector CT imaging of the head and neck was performed using
the standard protocol during bolus administration of intravenous
contrast. Multiplanar CT image reconstructions and MIPs were
obtained to evaluate the vascular anatomy. Carotid stenosis
measurements (when applicable) are obtained utilizing NASCET
criteria, using the distal internal carotid diameter as the
denominator.
CONTRAST:  75mL OMNIPAQUE IOHEXOL 350 MG/ML SOLN

[Series 10: ax thin · axial · 0.52mm/px · z∈[+462,+687]mm · 6 of 315 slices shown]
[im 45/315  soft-tissue]
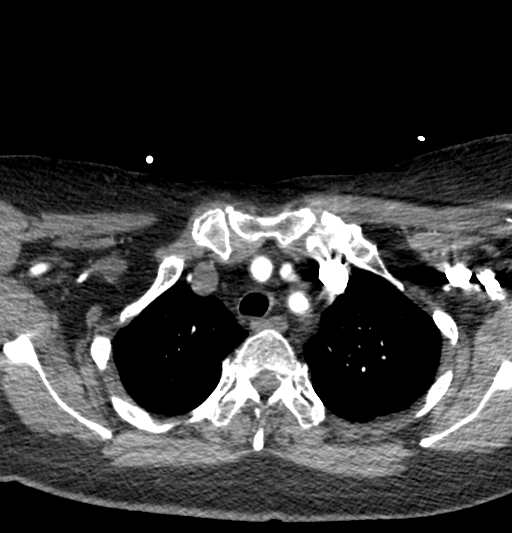
[im 90/315  bone]
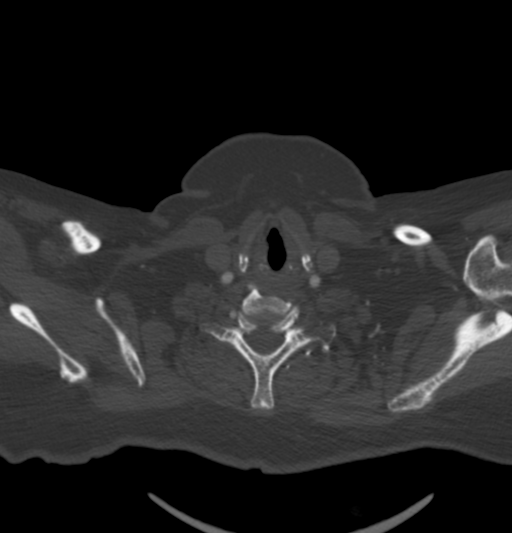
[im 135/315  soft-tissue]
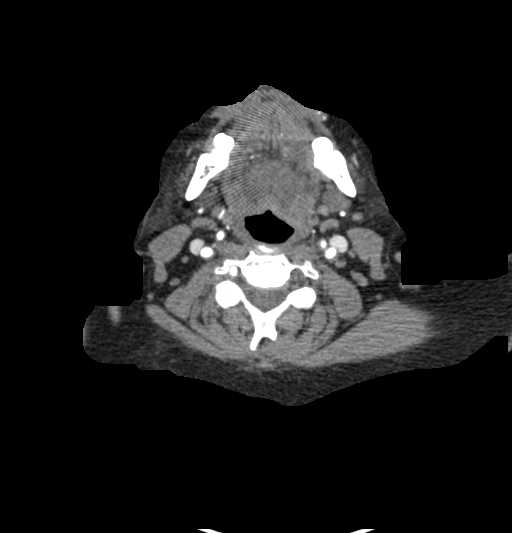
[im 180/315  bone]
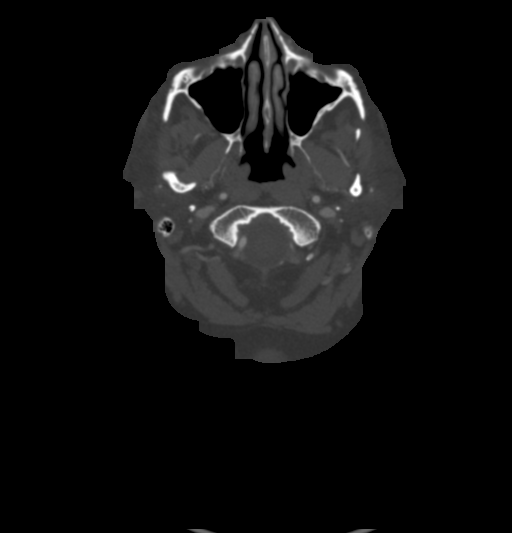
[im 225/315  soft-tissue]
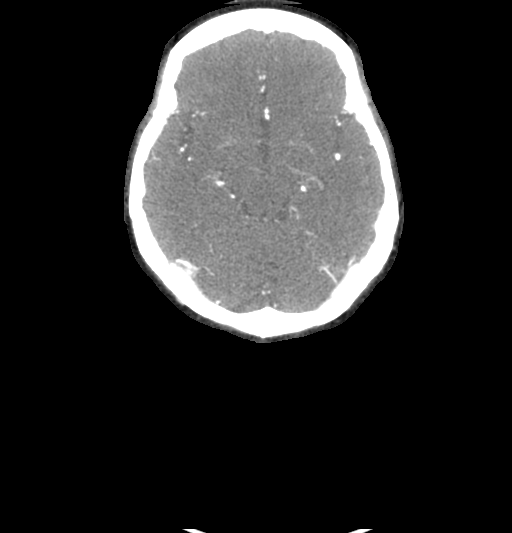
[im 270/315  bone]
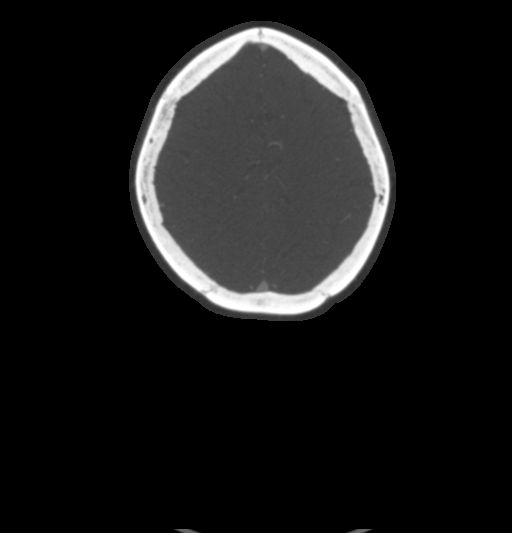

[6 of 35 positions shown; findings below may reference images not displayed]

FINDINGS: CT HEAD FINDINGS

Brain:

Mild generalized cerebral and cerebellar atrophy.

Redemonstrated evolving subacute infarcts within the right frontal
lobe corona radiata.

Small subacute appearing infarcts within the right thalamocapsular
junction were better appreciated on the brain MRI performed earlier
today.

Known chronic infarcts within the left basal ganglia, left occipital
cortex and right thalamus.

Background mild patchy and ill-defined hypoattenuation within the
cerebral white matter, nonspecific but compatible with chronic small
vessel ischemic disease.

There is no acute intracranial hemorrhage.

No extra-axial fluid collection.

No evidence of an intracranial mass.

No midline shift.

Vascular: Stent now present in the region of the M1 and M2 right
middle cerebral artery. Elsewhere, no hyperdense vessel is
identified. Atherosclerotic calcifications.

Skull: Normal. Negative for fracture or focal lesion.

Sinuses: No significant paranasal sinus disease.

Orbits: No mass or acute finding. Right periorbital soft tissue
swelling.

Review of the MIP images confirms the above findings

CTA NECK FINDINGS

Aortic arch: Standard aortic branching. Atherosclerotic plaque
within the visualized aortic arch and proximal major branch vessels
of the neck. No hemodynamically significant innominate or proximal
subclavian artery stenosis.

Right carotid system: CCA and ICA patent within the neck without
significant stenosis (50% or greater). Mild soft plaque within the
proximal ICA.

Left carotid system: CCA and ICA patent within the neck without
significant stenosis (50% or greater). Mild soft and calcified
plaque within the proximal ICA.

Vertebral arteries: The right vertebral arteries patent within the
neck without hemodynamically significant stenosis. Only minimal and
thready opacification is seen within the left vertebral artery
within the neck, likely functionally occluded when correlating with
the report from prior catheter based angiography [DATE].

Skeleton: Cervical spondylosis. No acute bony abnormality or
aggressive osseous lesion.

Other neck: No neck mass or cervical lymphadenopathy.

Upper chest: No consolidation within the imaged lung apices.

Review of the MIP images confirms the above findings

CTA HEAD FINDINGS

Anterior circulation:

The intracranial internal carotid arteries are patent.
Atherosclerotic plaque within both vessels. Up to moderate stenosis
within the distal cavernous/paraclinoid right ICA. No more than mild
stenosis of the intracranial left ICA. Redemonstrated 50% stenosis
at the origin of the right middle cerebral artery. There has been
interval stenting of the M1/M2 right middle cerebral artery. The
stent appears patent. There is limited assessment for stenoses in
the region of the radiopaque stent markers. No right M2 proximal
branch occlusion is identified distal to the stent. The M1 left
middle cerebral artery is patent. Redemonstrated sites of up to
moderate stenosis within a mid M2 left MCA branches (series 15,
image 36). The anterior cerebral arteries are patent. No
intracranial aneurysm is identified.

Posterior circulation:

The V4 right vertebral artery is patent without stenosis.
Enhancement within the non dominant intracranial left vertebral
artery, likely due to retrograde flow. The basilar artery is patent.
The posterior cerebral arteries are patent. A left posterior
communicating artery is present. Redemonstrated severe stenosis
within the left PCA P2 segment. The right posterior communicating
artery is hypoplastic or absent.

Venous sinuses: Within the limitations of contrast timing, no
convincing thrombus.

Anatomic variants: As described

Review of the MIP images confirms the above findings
IMPRESSION: CT head:

1. Redemonstrated evolving subacute infarcts within the right
frontal lobe corona radiata.
2. Known subacute infarcts within the right thalamocapsular junction
were better appreciated on the brain MRI performed earlier today.
3. Known chronic infarcts within the left basal ganglia, right
thalamus and left occipital cortex.
4. Background mild generalized parenchymal atrophy and cerebral
white matter chronic small vessel ischemic disease.
5. Redemonstrated right periorbital soft tissue swelling.

CTA neck:

1. The bilateral common carotid and internal carotid arteries are
patent within the neck without hemodynamically significant stenosis
(50% or greater). Mild atherosclerotic plaque within the proximal
internal carotid arteries bilaterally.
2. Only minimal and thready opacification of the left vertebral
artery within the neck. This vessel is likely functionally occluded
(correlating with the findings on catheter-based angiography
performed [DATE]).
[DATE]. Right vertebral artery patent within the neck without stenosis.

CTA head:

1. A stent traverses the M1/M2 right middle cerebral artery. The
stent appears patent. There is limited assessment for stenoses in
the region of the radiopaque stent markers. No right M2 proximal
branch occlusion is identified distal to the stent.
2. Additional intracranial atherosclerotic disease, as described and
otherwise unchanged. Findings are most notably as follows.
3. Up to moderate stenosis within the distal cavernous/paraclinoid
right ICA.
4. Moderate stenosis at the origin of the M1 right middle cerebral
artery.
5. Sites of up to moderate stenosis within mid M2 left MCA vessels.
6. Severe stenosis within the P2 left PCA.
7. Enhancement is seen in the non dominant intracranial left
vertebral artery, likely due to retrograde flow.

## 2021-06-12 MED ORDER — HEPARIN SODIUM (PORCINE) 5000 UNIT/ML IJ SOLN
5000.0000 [IU] | Freq: Three times a day (TID) | INTRAMUSCULAR | Status: DC
  Administered 2021-06-12 – 2021-06-13 (×4): 5000 [IU] via SUBCUTANEOUS
  Filled 2021-06-12 (×4): qty 1

## 2021-06-12 MED ORDER — CYANOCOBALAMIN 500 MCG PO TABS
500.0000 ug | ORAL_TABLET | Freq: Every day | ORAL | Status: DC
  Administered 2021-06-12 – 2021-06-13 (×2): 500 ug via ORAL
  Filled 2021-06-12 (×2): qty 1

## 2021-06-12 MED ORDER — IOHEXOL 350 MG/ML SOLN
75.0000 mL | Freq: Once | INTRAVENOUS | Status: AC | PRN
  Administered 2021-06-12: 75 mL via INTRAVENOUS

## 2021-06-12 NOTE — Plan of Care (Signed)

## 2021-06-12 NOTE — Progress Notes (Signed)
CCMD called and reported pt had a run of SVT's that did not sustain. Patient checked on and was resting with eyes closed in no apparent distress. -nothing follows

## 2021-06-12 NOTE — ED Notes (Addendum)
Pt back from MRI at this time

## 2021-06-12 NOTE — Progress Notes (Signed)
Eeg done 

## 2021-06-12 NOTE — Consult Note (Signed)
Neurology Consultation Reason for Consult: Stroke Referring Physician: Karleen Hampshire, V  CC: Syncope  History is obtained from:Patient, chart review  HPI: Madison Davidson is a 70 y.o. female with a history of hypertension who was recently admitted with stroke and was transferred to [facility] for intracranial stenting.  She did an MRI on 6/13 but continued to have waxing and waning symptoms until after her procedure on 6/16.  She was in her normal state of health after the procedure until yesterday at which point she had a sudden loss of consciousness.  She describes that she was in the checkout line at the grocery store when she had a sudden abrupt loss of consciousness.  The next thing she remembers that she was sitting on the floor surrounded by people.  She does not think that she was confused after the fact.  She denies any convulsive activity, but unfortunately we do not have a witness to the event to interview.  She was evaluated in the emergency department and an MRI was obtained which shows subacute strokes in the MCA distribution but separate from the previous stroke.   LKW: prior to previous presentation.  tpa given?: no, outside window   ROS: A 14 point ROS was performed and is negative except as noted in the HPI.  Past Medical History:  Diagnosis Date   Cancer ([place])    skin   Coronary artery disease    Diabetes mellitus without complication ([place])    Hypertension    Stroke [facility])      Family History  Problem Relation Age of Onset   Heart disease Mother    Heart disease Father      Social History:  reports that she has never smoked. She has never used smokeless tobacco. She reports that she does not drink alcohol and does not use drugs.   Exam: Current vital signs: BP (!) 172/47 (BP Location: Right Arm)   Pulse (!) 59   Temp 97.8 F (36.6 C)   Resp 16   Ht 5\' 2"  (1.575 m)   Wt 77.1 kg   SpO2 99%   BMI 31.09 kg/m  Vital signs in last 24 hours: Temp:  [97.8 F  (36.6 C)-98.5 F (36.9 C)] 97.8 F (36.6 C) (07/18 0747) Pulse Rate:  [59-75] 59 (07/18 0747) Resp:  [14-20] 16 (07/18 0747) BP: (102-181)/(41-109) 172/47 (07/18 0747) SpO2:  [97 %-100 %] 99 % (07/18 0747) Weight:  [77.1 kg-81.6 kg] 77.1 kg (07/18 0053)   Physical Exam  Constitutional: Appears well-developed and well-nourished.  Psych: Affect appropriate to situation Eyes: No scleral injection HENT: No OP obstruction MSK: no joint deformities.  Cardiovascular: Normal rate and regular rhythm.  Respiratory: Effort normal, non-labored breathing GI: Soft.  No distension. There is no tenderness.  Skin: WDI  Neuro: Mental Status: Patient is awake, alert, oriented to person, place, month, year, and situation. Patient is able to give a clear and coherent history. No signs of aphasia or neglect Cranial Nerves: II: Visual Fields are full. Pupils are equal, round, and reactive to light.   III,IV, VI: EOMI without ptosis or diploplia.  V: Facial sensation is symmetric to temperature VII: Facial movement is symmetric.  VIII: hearing is intact to voice X: Uvula elevates symmetrically XI: Shoulder shrug is symmetric. XII: tongue is midline without atrophy or fasciculations.  Motor: Tone is normal. Bulk is normal. 5/5 strength was present in all four extremities.  Sensory: Sensation is mildly diminished on the left Cerebellar: FNF and  HKS are intact bilaterally    I have reviewed labs in epic and the results pertinent to this consultation are: CMP - unremarkable LDL from 06/14 - 119 A1C from 6/14 - 7.1 CBC borderline microcytic anemia Hgb 10.1  I have reviewed the images obtained: MRI brain - subacute thalamocapsular strokes   Impression: 70 yo F with recent infarct who presents with syncope.  I suspect that the infarct seen was related to her previous waxing and waning symptoms associated with her stenosis rather than any acute change related to syncope.  Though periprocedural  would also be possible, it does not have the embolic appearance of a catheter related embolus.  I would not change secondary stroke prevention based on this.  I do think it would be prudent to repeat vascular imaging to make sure the stent is patent.  For evaluation of her syncope, an EEG would also be prudent.  Recommendations: 1) CTA head and neck 2) EEG 3) continue home aspirin/Plavix/statin 4) I have discussed with the patient that she is not allowed to drive   Roland Rack, MD [facility] [phone]  If 7pm- 7am, please page neurology on call as listed in [place].

## 2021-06-12 NOTE — Progress Notes (Signed)
PROGRESS NOTE    Madison Davidson  FUX:[id number] DOB: 19-Jun-1951 DOA: 06/11/2021 PCP: [facility]    Chief Complaint  Patient presents with   Near Syncope    Brief Narrative:  Madison Davidson is a 70 y.o. female with medical history significant for recent right MCA stroke, hypertension, non-insulin-dependent diabetes mellitus, hypothyroid, hyperlipidemia, presents emergency department for chief concerns of syncopal event at [facility]. Patient was admitted for acute right frontal lobe infarct with symptomatic right MCA stenosis status post IR stenting in the right distal M1 MCA on 05/11/2021.    Assessment & Plan:   Principal Problem:   Syncope Active Problems:   Benign essential hypertension   Hyperlipidemia, mixed   DM II (diabetes mellitus, type II), controlled (HCC)   CVA (cerebral vascular accident) ([place])   Syncope  Unclear etiology, differential include new acute stroke, vs seizures vs arrhythmias  Reviewed CT head without contrast and MRI brain without contrast with the patient.  EEG ordered.  Last echocardiogram showed LVEF 60 to 02%, diastolic parameters are indeterminate.  LDL 119, hemoglobin A1c is 7.1  Pt on aspirin, plavix and lipitor.  Neurology consulted.  Therapy evaluations are pending.    Vitamin b12 deficiency:  - supplementation ordered.    Hypertension:  Sub optimally controlled. Allow permissive hypertension.    Right orbit contusion:  Sec to fall. Monitor.     Type 2 DM;  Non insulin dependent,  CBG (last 3)  Recent Labs    06/11/21 1741 06/11/21 2207 06/12/21 0749  GLUCAP 154* 80 74   Resume SSI.     Hypothyroidism:  Continue with synthroid.  Check thyroid panel.   Hyperlipidemia:  Last LDL is 119, continue with LIPITOR.    Depression; Continue with Celexa.    Recently diagnosed with CVA  S/p right MCA stent placement, discharged on aspirin, plavix and lipitor.    DVT prophylaxis:  (heparin) Code Status: (Full code) Family Communication: none at bedside.  Disposition:   Status is: Observation  The patient will require care spanning > 2 midnights and should be moved to inpatient because: Ongoing diagnostic testing needed not appropriate for outpatient work up and Unsafe d/c plan  Dispo: The patient is from: Home              Anticipated d/c is to:  pending.               Patient currently is not medically stable to d/c.   Difficult to place patient No       Consultants:  neurology  Procedures:  MRI BRAIN  Continued interval evolution of recently identified subacute right frontal corona radiata infarct. No associated hemorrhage or significant mass effect.  Two additional subtle subcentimeter foci of diffusion abnormality involving the right thalamocapsular region, not definitely seen on prior MRI, but also subacute in appearance. No associated Hemorrhage Chronic left basal ganglia and left occipital infarcts.  Antimicrobials:  none.   Subjective: No chest pain or sob, no nausea, vomiting or headache.   Objective: Vitals:   06/11/21 2100 06/12/21 0053 06/12/21 0428 06/12/21 0747  BP: (!) 162/57 (!) 181/58 (!) 170/50 (!) 172/47  Pulse: 69 70 62 (!) 59  Resp: 17 16 16 16   Temp:  98.5 F (36.9 C) 98 F (36.7 C) 97.8 F (36.6 C)  TempSrc:      SpO2: 97% 100% 99% 99%  Weight:  77.1 kg    Height:  5\' 2"  (1.575 m)  No intake or output data in the 24 hours ending 06/12/21 0758 Filed Weights   06/11/21 1741 06/12/21 0053  Weight: 81.6 kg 77.1 kg    Examination:  General exam: Appears calm and comfortable  Respiratory system: Clear to auscultation. Respiratory effort normal. Cardiovascular system: S1 & S2 heard, RRR. No JVD, No pedal edema. Gastrointestinal system: Abdomen is nondistended, soft and nontender. Normal bowel sounds heard. Central nervous system: Alert , answering questions appropriately.  Extremities: Symmetric 5 x 5  power. Skin: No rashes, lesions or ulcers Psychiatry: flat affect.     Data Reviewed: I have personally reviewed following labs and imaging studies  CBC: Recent Labs  Lab 06/11/21 1810 06/12/21 0610  WBC 12.1* 9.2  HGB 10.2* 10.1*  HCT 31.7* 30.7*  MCV 81.1 79.7*  PLT 285 161    Basic Metabolic Panel: Recent Labs  Lab 06/11/21 1810 06/12/21 0610  NA 136 136  K 4.3 3.5  CL 107 106  CO2 21* 24  GLUCOSE 151* 65*  BUN 27* 22  CREATININE 1.07* 0.74  CALCIUM 9.0 8.5*    GFR: Estimated Creatinine Clearance: 62 mL/min (by C-G formula based on SCr of 0.74 mg/dL).  Liver Function Tests: Recent Labs  Lab 06/12/21 0610  AST 16  ALT 17  ALKPHOS 58  BILITOT 0.5  PROT 6.2*  ALBUMIN 3.2*    CBG: Recent Labs  Lab 06/11/21 1741 06/11/21 2207 06/12/21 0749  GLUCAP 154* 80 74     Recent Results (from the past 240 hour(s))  Resp Panel by RT-PCR (Flu A&B, Covid) Nasopharyngeal Swab     Status: None   Collection Time: 06/11/21  9:17 PM   Specimen: Nasopharyngeal Swab; Nasopharyngeal(NP) swabs in vial transport medium  Result Value Ref Range Status   SARS Coronavirus 2 by RT PCR NEGATIVE NEGATIVE Final    Comment: (NOTE) SARS-CoV-2 target nucleic acids are NOT DETECTED.  The SARS-CoV-2 RNA is generally detectable in upper respiratory specimens during the acute phase of infection. The lowest concentration of SARS-CoV-2 viral copies this assay can detect is 138 copies/mL. A negative result does not preclude SARS-Cov-2 infection and should not be used as the sole basis for treatment or other patient management decisions. A negative result may occur with  improper specimen collection/handling, submission of specimen other than nasopharyngeal swab, presence of viral mutation(s) within the areas targeted by this assay, and inadequate number of viral copies(<138 copies/mL). A negative result must be combined with clinical observations, patient history, and  epidemiological information. The expected result is Negative.  Fact Sheet for Patients:  EntrepreneurPulse.com.au  Fact Sheet for Healthcare Providers:  IncredibleEmployment.be  This test is no t yet approved or cleared by the [place] FDA and  has been authorized for detection and/or diagnosis of SARS-CoV-2 by FDA under an Emergency Use Authorization (EUA). This EUA will remain  in effect (meaning this test can be used) for the duration of the COVID-19 declaration under Section 564(b)(1) of the Act, 21 U.S.C.section 360bbb-3(b)(1), unless the authorization is terminated  or revoked sooner.       Influenza A by PCR NEGATIVE NEGATIVE Final   Influenza B by PCR NEGATIVE NEGATIVE Final    Comment: (NOTE) The Xpert Xpress SARS-CoV-2/FLU/RSV plus assay is intended as an aid in the diagnosis of influenza from Nasopharyngeal swab specimens and should not be used as a sole basis for treatment. Nasal washings and aspirates are unacceptable for Xpert Xpress SARS-CoV-2/FLU/RSV testing.  Fact Sheet for Patients: EntrepreneurPulse.com.au  Fact  Sheet for Healthcare Providers: IncredibleEmployment.be  This test is not yet approved or cleared by the [place] and has been authorized for detection and/or diagnosis of SARS-CoV-2 by FDA under an Emergency Use Authorization (EUA). This EUA will remain in effect (meaning this test can be used) for the duration of the COVID-19 declaration under Section 564(b)(1) of the Act, 21 U.S.C. section 360bbb-3(b)(1), unless the authorization is terminated or revoked.  Performed at [facility], [address], [place], [place] [address]          Radiology Studies: X-ray chest PA and lateral  Result Date: 06/11/2021 CLINICAL DATA:  Syncope, hypertension, diabetes EXAM: CHEST - 2 VIEW COMPARISON:  05/23/2021 FINDINGS: Frontal and lateral views of the  chest demonstrate an unremarkable cardiac silhouette. No airspace disease, effusion, or pneumothorax. No acute bony abnormalities. IMPRESSION: 1. No acute intrathoracic process. Electronically Signed   By: Randa Ngo M.D.   On: 06/11/2021 22:09   CT Head Wo Contrast  Result Date: 06/11/2021 CLINICAL DATA:  Mental status change.  Near syncope. EXAM: CT HEAD WITHOUT CONTRAST CT CERVICAL SPINE WITHOUT CONTRAST TECHNIQUE: Multidetector CT imaging of the head and cervical spine was performed following the standard protocol without intravenous contrast. Multiplanar CT image reconstructions of the cervical spine were also generated. COMPARISON:  CT head 06/08/2021 FINDINGS: CT HEAD FINDINGS Brain: Hypodensity right frontal white matter compatible with recent infarct. This was present previously. Negative for new or acute infarct. No hemorrhage or mass. Chronic infarct left anterior basal ganglia. Negative for hydrocephalus. Vascular: Interval placement of right MCA stent in the right M1/ M2 region. Negative for hyperdense vessel Skull: Negative Sinuses/Orbits: Negative Other: Contusion lateral to the right orbit which was not present previously. This is presumably posttraumatic. CT CERVICAL SPINE FINDINGS Alignment: Normal alignment with straightening of the cervical lordosis. Skull base and vertebrae: Negative for fracture Soft tissues and spinal canal: Negative Disc levels: Disc degeneration and spondylosis C3 through C7. Mild spinal stenosis C4-5, C5-6, C6-7. Upper chest: Lung apices clear bilaterally. Other: None IMPRESSION: 1. No acute intracranial abnormality. Recent infarct right frontal white matter. Recent placement of right MCA stent 2. Soft tissue contusion lateral the right orbit presumably from recent injury. 3. Negative for cervical spine fracture. Electronically Signed   By: Franchot Gallo M.D.   On: 06/11/2021 19:16   CT Cervical Spine Wo Contrast  Result Date: 06/11/2021 CLINICAL DATA:  Mental  status change.  Near syncope. EXAM: CT HEAD WITHOUT CONTRAST CT CERVICAL SPINE WITHOUT CONTRAST TECHNIQUE: Multidetector CT imaging of the head and cervical spine was performed following the standard protocol without intravenous contrast. Multiplanar CT image reconstructions of the cervical spine were also generated. COMPARISON:  CT head 06/08/2021 FINDINGS: CT HEAD FINDINGS Brain: Hypodensity right frontal white matter compatible with recent infarct. This was present previously. Negative for new or acute infarct. No hemorrhage or mass. Chronic infarct left anterior basal ganglia. Negative for hydrocephalus. Vascular: Interval placement of right MCA stent in the right M1/ M2 region. Negative for hyperdense vessel Skull: Negative Sinuses/Orbits: Negative Other: Contusion lateral to the right orbit which was not present previously. This is presumably posttraumatic. CT CERVICAL SPINE FINDINGS Alignment: Normal alignment with straightening of the cervical lordosis. Skull base and vertebrae: Negative for fracture Soft tissues and spinal canal: Negative Disc levels: Disc degeneration and spondylosis C3 through C7. Mild spinal stenosis C4-5, C5-6, C6-7. Upper chest: Lung apices clear bilaterally. Other: None IMPRESSION: 1. No acute intracranial abnormality. Recent infarct right frontal  white matter. Recent placement of right MCA stent 2. Soft tissue contusion lateral the right orbit presumably from recent injury. 3. Negative for cervical spine fracture. Electronically Signed   By: Franchot Gallo M.D.   On: 06/11/2021 19:16   MR BRAIN WO CONTRAST  Result Date: 06/12/2021 CLINICAL DATA:  Initial evaluation for recurrent syncope, recent infarction, status post angioplasty and stenting. EXAM: MRI HEAD WITHOUT CONTRAST TECHNIQUE: Multiplanar, multiecho pulse sequences of the brain and surrounding structures were obtained without intravenous contrast. COMPARISON:  Comparison made with prior CT from 06/11/2021 as well as  multiple previous exams. FINDINGS: Brain: Cerebral volume stable, and remains within normal limits for age. Small remote infarcts involving the left basal ganglia and left occipital cortex again seen, stable. Mild chronic microvascular ischemic disease noted involving the pons. Probable small remote lacunar infarct at the right thalamus. There has been continued interval evolution of recently identified subacute infarct involving the right frontal corona radiata, overall similar in size and distribution from previous. No evidence for hemorrhagic transformation or significant mass effect. There are 2 additional subtle foci of diffusion abnormality involving the right thalamic capsular region measuring up to 6 mm, not definitely seen on prior MRI, but also subacute in appearance. No associated hemorrhage. No other restricted diffusion to suggest acute or interval infarction. Gray-white matter differentiation otherwise maintained. No acute intracranial hemorrhage. No mass lesion, midline shift or mass effect. No hydrocephalus or extra-axial fluid collection. Pituitary gland suprasellar region normal. Midline structures intact. Vascular: Susceptibility artifact related to a vascular stent at the region of the right MCA bifurcation. Major intracranial vascular flow voids are otherwise maintained and stable. Skull and upper cervical spine: Craniocervical junction within normal limits. Degenerative spondylosis noted at C2-3 without high-grade stenosis. Bone marrow signal intensity normal. No scalp soft tissue abnormality. Sinuses/Orbits: Globes and orbital soft tissues within normal limits. Paranasal sinuses are clear. No mastoid effusion. Inner ear structures grossly normal. Other: None. IMPRESSION: 1. Continued interval evolution of recently identified subacute right frontal corona radiata infarct. No associated hemorrhage or significant mass effect. 2. Two additional subtle subcentimeter foci of diffusion abnormality  involving the right thalamocapsular region, not definitely seen on prior MRI, but also subacute in appearance. No associated hemorrhage. 3. Otherwise stable appearance of the brain with no other acute intracranial abnormality. Chronic infarcts involving the left basal ganglia, left occipital cortex, and right thalamus. Electronically Signed   By: Jeannine Boga M.D.   On: 06/12/2021 01:13        Scheduled Meds:  aspirin EC  81 mg Oral Daily   atorvastatin  80 mg Oral QHS   citalopram  10 mg Oral Daily   clopidogrel  75 mg Oral Daily   insulin aspart  0-15 Units Subcutaneous TID WC   insulin aspart  0-5 Units Subcutaneous QHS   levothyroxine  125 mcg Oral Q0600   melatonin  2.5 mg Oral QHS   sodium chloride flush  3 mL Intravenous Q12H   Continuous Infusions:   LOS: 0 days        Hosie Poisson, MD [facility]   To contact the attending provider between 7A-7P or the covering provider during after hours 7P-7A, please log into the web site [email or web address] and access using universal [place] password for that web site. If you do not have the password, please call the hospital operator.  06/12/2021, 7:58 AM

## 2021-06-13 DIAGNOSIS — G9389 Other specified disorders of brain: Secondary | ICD-10-CM

## 2021-06-13 DIAGNOSIS — R401 Stupor: Secondary | ICD-10-CM

## 2021-06-13 LAB — CBC
HCT: 31.2 % — ABNORMAL LOW (ref 36.0–46.0)
Hemoglobin: 10 g/dL — ABNORMAL LOW (ref 12.0–15.0)
MCH: 25.3 pg — ABNORMAL LOW (ref 26.0–34.0)
MCHC: 32.1 g/dL (ref 30.0–36.0)
MCV: 78.8 fL — ABNORMAL LOW (ref 80.0–100.0)
Platelets: 255 10*3/uL (ref 150–400)
RBC: 3.96 MIL/uL (ref 3.87–5.11)
RDW: 14.8 % (ref 11.5–15.5)
WBC: 6.1 10*3/uL (ref 4.0–10.5)
nRBC: 0 % (ref 0.0–0.2)

## 2021-06-13 LAB — BASIC METABOLIC PANEL
Anion gap: 9 (ref 5–15)
BUN: 14 mg/dL (ref 8–23)
CO2: 25 mmol/L (ref 22–32)
Calcium: 8.6 mg/dL — ABNORMAL LOW (ref 8.9–10.3)
Chloride: 105 mmol/L (ref 98–111)
Creatinine, Ser: 0.68 mg/dL (ref 0.44–1.00)
GFR, Estimated: >60 mL/min (ref >60)
Glucose, Bld: 101 mg/dL — ABNORMAL HIGH (ref 70–99)
Potassium: 4 mmol/L (ref 3.5–5.1)
Sodium: 139 mmol/L (ref 135–145)

## 2021-06-13 LAB — VITAMIN B12: Vitamin B-12: 193 pg/mL (ref 180–914)

## 2021-06-13 LAB — GLUCOSE, CAPILLARY
Glucose-Capillary: 133 mg/dL — ABNORMAL HIGH (ref 70–99)
Glucose-Capillary: 177 mg/dL — ABNORMAL HIGH (ref 70–99)
Glucose-Capillary: 101 mg/dL — ABNORMAL HIGH (ref 70–99)

## 2021-06-13 LAB — T4, FREE: Free T4: 0.97 ng/dL (ref 0.61–1.12)

## 2021-06-13 LAB — TSH: TSH: 1.282 u[IU]/mL (ref 0.350–4.500)

## 2021-06-13 MED ORDER — CYANOCOBALAMIN 500 MCG PO TABS: 500.0000 ug | ORAL_TABLET | Freq: Every day | ORAL | 3 refills | Status: DC

## 2021-06-13 NOTE — Evaluation (Signed)
Physical Therapy Evaluation Patient Details Name: Madison Davidson MRN: [id number] DOB: [birth year] Today's Date: 06/13/2021   History of Present Illness  70 y.o. 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 She presents to the emergency department for chief concerns of a syncopal event at [facility].  Clinical Impression  Pt is pleasant and agreeable to PT evaluation. She lives alone in a 1st floor apartment and reports no steps to enter. She does have to go downstairs to do laundry but she states there is an Media planner. He daughters are available to assist PRN however not 24/7. Pt performed all OOB mobility within the room per pt preference as her pants were went and she did not want to don a gown. All functional mobility was performed with SUP for safety, including bed mobility, STS, toilet transfer and ambulation. Pt would benefit from using RW during standing and ambulatory activities. She does demonstrate deficits in static and dynamic stability, ambulation, gait mechanics, transfers and overall safety awareness. Education was provided on safety during mobility, use of AD and fall prevention. She would benefit from skilled PT to address above deficits and promote optimal return to PLOF.       Follow Up Recommendations Home health PT    Equipment Recommendations  None recommended by PT    Recommendations for Other Services       Precautions / Restrictions Precautions Precautions: Fall Restrictions Weight Bearing Restrictions: No      Mobility  Bed Mobility Overal bed mobility: Modified Independent             General bed mobility comments: Safe and independent using bed rails and HOB elevated. Supine>sit observed.    Transfers Overall transfer level: Needs assistance Equipment used: None Transfers: Sit to/from Stand Sit to Stand: Supervision          General transfer comment: Close SUP for safety as pt refused RW  Ambulation/Gait Ambulation/Gait assistance: Min guard Gait Distance (Feet): 40 Feet Assistive device: None Gait Pattern/deviations: Step-through pattern;Decreased step length - right;Decreased step length - left;Decreased stride length;Trunk flexed;Shuffle Gait velocity: decreased   General Gait Details: Ambulated from bed<>door x 2 reps w/o AD, close SUP for safety. She looked at the ground throughout ambulation with a shuffled pattern. Decreased foot clearance bilaterally, especially on the LLE; VC to improve, very little pt feedback to cueing.  Stairs            Wheelchair Mobility    Modified Rankin (Stroke Patients Only)       Balance Overall balance assessment: Needs assistance;Mild deficits observed, not formally tested Sitting-balance support: Feet supported Sitting balance-Leahy Scale: Good Sitting balance - Comments: EOB   Standing balance support: No upper extremity supported;During functional activity Standing balance-Leahy Scale: Fair Standing balance comment: Refused RW. Forward posture, increased sway during static standing and increased lateral weight shifting during ambulation.                             Pertinent Vitals/Pain Pain Assessment: No/denies pain    Home Living Family/patient expects to be discharged to:: Private residence Living Arrangements: Alone Available Help at Discharge: Family;Available PRN/intermittently Type of Home: Apartment Home Access: Level entry     Home Layout: One level Home Equipment: Cane - single point;Walker - 2 wheels Additional Comments: Pt lives alone in 1st floor apartment. Her daughters live near by and can assist when needed but  they are not available 24/7. Daughters would like for pt to enter a long-term care facility. Per pt: MD states pt is no longer safe to drive.    Prior Function Level of Independence: Independent          Comments: Pt reports 3 falls since leaving [facility] on 05/29/21. She is unsure if they are due to passing out or LOB. She was advised to use RW when leaving [facility]. Pt states she has a RW but does not use it.     Hand Dominance   Dominant Hand: Right    Extremity/Trunk Assessment        Lower Extremity Assessment Lower Extremity Assessment: Overall WFL for tasks assessed (MMT: 4+/5 with RLE, 4/5 LLE. Impaired sensation to LT in LLE. LUE sensation reported as normal.)    Cervical / Trunk Assessment Cervical / Trunk Assessment: Kyphotic  Communication   Communication: No difficulties  Cognition Arousal/Alertness: Awake/alert Behavior During Therapy: WFL for tasks assessed/performed Overall Cognitive Status: Within Functional Limits for tasks assessed                                 General Comments: A&Ox4 - did not know specific date but she did recite July 2022, middle of the month. May have some safety awareness deficits as she refuses to use RW for safer mobility.      General Comments      Exercises Other Exercises Other Exercises: Pt educated on role of PT, pt current stability level, importance of RW for stability and prevention of falls. Other Exercises: Ambulatory transfer EOB>toilet with close SUP for safety and VC for improved gait mechanics. Standing balance with RUE support during clothing management. Static sitting during toileting without UE support. OT arrived to transfer pt back to bed.   Assessment/Plan    PT Assessment Patient needs continued PT services  PT Problem List Decreased strength;Decreased mobility;Decreased safety awareness;Decreased activity tolerance;Decreased balance       PT Treatment Interventions DME instruction;Therapeutic activities;Gait training;Therapeutic exercise;Patient/family education;Stair training;Balance training;Functional mobility training;Neuromuscular re-education    PT Goals (Current goals can be found in the Care  Plan section)  Acute Rehab PT Goals Patient Stated Goal: To return home PT Goal Formulation: With patient Time For Goal Achievement: 06/27/21    Frequency Min 2X/week   Barriers to discharge        Co-evaluation               AM-PAC PT "6 Clicks" Mobility  Outcome Measure Help needed turning from your back to your side while in a flat bed without using bedrails?: None Help needed moving from lying on your back to sitting on the side of a flat bed without using bedrails?: A Little Help needed moving to and from a bed to a chair (including a wheelchair)?: A Little Help needed standing up from a chair using your arms (e.g., wheelchair or bedside chair)?: A Little Help needed to walk in hospital room?: A Little Help needed climbing 3-5 steps with a railing? : A Little 6 Click Score: 19    End of Session Equipment Utilized During Treatment: Gait belt Activity Tolerance: Patient tolerated treatment well Patient left: Other (comment) (OT arrived during toileting - pt was left on toilet with OT Devin present in bathroom.)   PT Visit Diagnosis: Unsteadiness on feet (R26.81);Other abnormalities of gait and mobility (R26.89);Repeated falls (R29.6)    Time: 3810-1751 PT Time  Calculation (min) (ACUTE ONLY): 24 min   Charges:   PT Evaluation $PT Eval Low Complexity: 1 Low PT Treatments $Therapeutic Activity: 8-22 mins       Patrina Levering PT, DPT 06/13/21 4:08 PM [phone]'  Madison Davidson Madison Davidson 06/13/2021, 3:59 PM

## 2021-06-13 NOTE — Evaluation (Signed)
Occupational Therapy Evaluation Patient Details Name: Madison Davidson MRN: [id number] DOB: 1951-08-19 Today's Date: 06/13/2021    History of Present Illness 70 y.o. 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 She presents to the emergency department for chief concerns of a syncopal event at [facility].   Clinical Impression   Ms Brau was seen for OT evaluation this date. Prior to hospital admission, pt was Independent for mobility and ADLs - reports falls hx. Pt lives alone with family available PRN. Pt presents to acute OT demonstrating impaired ADL performance and functional mobility 2/2 decreased activity tolerance and poor safety awareness. Pt currently requires SBA for toilet t/f, perihygiene, and hand washing standing sink side. MIN A don mesh underwear in sitting/standing. Pt would benefit from skilled OT to address noted impairments and functional limitations (see below for any additional details) in order to maximize safety and independence while minimizing falls risk and caregiver burden. Upon hospital discharge, recommend HHOT to maximize pt safety and return to functional independence during meaningful occupations of daily life.     Follow Up Recommendations  Home health OT    Equipment Recommendations  3 in 1 bedside commode    Recommendations for Other Services       Precautions / Restrictions Precautions Precautions: Fall Restrictions Weight Bearing Restrictions: No      Mobility Bed Mobility Overal bed mobility: Modified Independent              Transfers Overall transfer level: Needs assistance Equipment used: None Transfers: Sit to/from Stand Sit to Stand: Supervision         General transfer comment: Close SUP for safety as pt refused RW    Balance Overall balance assessment: Needs assistance;Mild deficits observed, not formally  tested Sitting-balance support: Feet supported Sitting balance-Leahy Scale: Good Sitting balance - Comments: EOB   Standing balance support: No upper extremity supported;During functional activity Standing balance-Leahy Scale: Fair Standing balance comment: Refused RW. Forward posture, increased sway during static standing and increased lateral weight shifting during ambulation.                           ADL either performed or assessed with clinical judgement   ADL Overall ADL's : Needs assistance/impaired                                       General ADL Comments: SBA for toilet t/f, perihygiene, and hand washing standing sink side. MIN A don mesh underwear in sitting/standing     Vision Baseline Vision/History: Wears glasses Wears Glasses: Distance only        Pertinent Vitals/Pain Pain Assessment: No/denies pain     Hand Dominance Right   Extremity/Trunk Assessment Upper Extremity Assessment Upper Extremity Assessment: Generalized weakness   Lower Extremity Assessment Lower Extremity Assessment: Generalized weakness   Cervical / Trunk Assessment Cervical / Trunk Assessment: Kyphotic   Communication Communication Communication: No difficulties   Cognition Arousal/Alertness: Awake/alert Behavior During Therapy: WFL for tasks assessed/performed Overall Cognitive Status: Within Functional Limits for tasks assessed                                 General Comments: poor safety awareness   General Comments  Exercises Exercises: Other exercises Other Exercises Other Exercises: Pt educated re; OT role, DME rec,s d/c recs, falls prevention, ECS Other Exercises: LBD, toileting, sit>sup, sit<>stand, sitting/standing balance/tolerance, grooming   Shoulder Instructions      Home Living Family/patient expects to be discharged to:: Private residence Living Arrangements: Alone Available Help at Discharge: Family;Available  PRN/intermittently Type of Home: Apartment Home Access: Level entry     Home Layout: One level               Home Equipment: Cane - single point;Walker - 2 wheels   Additional Comments: Pt lives alone in 1st floor apartment. Her daughters live near by and can assist when needed but they are not available 24/7. Daughters would like for pt to enter a long-term care facility. Per chart MD states pt is no longer safe to drive.  Lives With: Alone    Prior Functioning/Environment Level of Independence: Independent        Comments: Pt reports 3 falls since leaving [facility] on 05/29/21. She is unsure if they are due to passing out or LOB. She was advised to use RW when leaving [facility]. Pt states she has a RW but does not use it.        OT Problem List: Decreased activity tolerance;Impaired balance (sitting and/or standing);Decreased safety awareness      OT Treatment/Interventions: Self-care/ADL training;Therapeutic exercise;Energy conservation;DME and/or AE instruction;Therapeutic activities;Patient/family education;Balance training    OT Goals(Current goals can be found in the care plan section) Acute Rehab OT Goals Patient Stated Goal: To return home OT Goal Formulation: With patient Time For Goal Achievement: 06/27/21 Potential to Achieve Goals: Good ADL Goals Pt Will Perform Grooming: Independently;standing Pt Will Transfer to Toilet: Independently;ambulating;regular height toilet Additional ADL Goal #1: Pt will Independently verbalize plan to implement x3 falls strategies  OT Frequency: Min 1X/week    AM-PAC OT "6 Clicks" Daily Activity     Outcome Measure Help from another person eating meals?: None Help from another person taking care of personal grooming?: A Little Help from another person toileting, which includes using toliet, bedpan, or urinal?: A Little Help from another person bathing (including washing, rinsing, drying)?: A Little Help from another person to put on and  taking off regular upper body clothing?: None Help from another person to put on and taking off regular lower body clothing?: A Little 6 Click Score: 20   End of Session    Activity Tolerance: Patient tolerated treatment well Patient left: in bed;with call bell/phone within reach;with bed alarm set;with nursing/sitter in room  OT Visit Diagnosis: Other abnormalities of gait and mobility (R26.89);History of falling (Z91.81)                Time: 4287-6811 OT Time Calculation (min): 14 min Charges:  OT General Charges $OT Visit: 1 Visit OT Evaluation $OT Eval Low Complexity: 1 Low OT Treatments $Self Care/Home Management : 8-22 mins  Dessie Coma, M.S. OTR/L  06/13/21, 4:25 PM  ascom [phone]

## 2021-06-13 NOTE — TOC Progression Note (Signed)
Transition of Care [facility]) - Progression Note    Patient Details  Name: Madison Davidson MRN: [id number] Date of Birth: 20-Feb-1951  Transition of Care [facility]) CM/SW [place], RN Phone Number: 06/13/2021, 1:08 PM  Clinical Narrative:    Addendum 1503:  Patient reports she lives at home alone and daughters live close by and can assist as needed.  Patient reports that daughters take her to her appointments, and assist her to obtain medications.  She also reports that she has no concerns about taking medications as prescribed.  Patient had no home health in place prior to admission, and has no concerns about returning home after discharge.  Patient stated she was tired and wanted to try to sleep.  TOC contact information given to patient  RNCM called and left message with patient's daughter.  PT/OT will see patient today and provide recommendations.  TOC will follow for needs and until discharge.         Expected Discharge Plan and Services                                                 Social Determinants of Health (SDOH) Interventions    Readmission Risk Interventions No flowsheet data found.

## 2021-06-13 NOTE — Procedures (Signed)
History: 70 year old female being evaluated for transient loss of consciousness  Sedation: None  Technique: This is a 21 channel routine scalp EEG performed at the bedside with bipolar and monopolar montages arranged in accordance to the international 10/20 system of electrode placement. One channel was dedicated to EKG recording.    Background: The background consists of intermixed alpha and beta activities. There is a well defined posterior dominant rhythm of 10 hz that attenuates with eye opening.  She has an increase in slow activity associated with drowsiness, but clear formed sleep structures are not observed.  She does have some irregular fronto-temporally predominant delta range activity present over the right hemisphere which is seen more prominently with drowsiness.  Photic stimulation: Physiologic driving is present  EEG Abnormalities: 1) mild right hemispheric slow activity  Clinical Interpretation: This EEG is consistent with a mild nonspecific right hemispheric dysfunction consistent with the patient's known recent stroke.   There was no seizure or seizure predisposition recorded on this study. Please note that lack of epileptiform activity on EEG does not preclude the possibility of epilepsy.   Roland Rack, MD [facility] [phone]  If 7pm- 7am, please page neurology on call as listed in [place].

## 2021-06-13 NOTE — Discharge Summary (Signed)
Physician Discharge Summary  Madison Davidson YIF:[id number] DOB: [birth year] DOA: 06/11/2021  [facility] [place] date: 06/11/2021 Discharge date: 06/13/2021  Admitted From: [facility].  Disposition:  Home.   Recommendations for Outpatient Follow-up:  Follow up with PCP in 1-2 weeks Please obtain BMP/CBC in one week Please follow up with Neurology in 2 to 4 weeks.  Please do not drive until you have been cleared by physician.  Please follow up with cardiology for Holter monitor placement to evaluate for arrhythmias  Home Health:yes.   Discharge Condition:Guarded.  CODE STATUS:Full code.  Diet recommendation: Heart Healthy / Carb Modified   Brief/Interim Summary: Madison Davidson is a 70 y.o. female with medical history significant for recent right MCA stroke, hypertension, non-insulin-dependent diabetes mellitus, hypothyroid, hyperlipidemia, presents emergency department for chief concerns of syncopal event at [facility]. Patient was admitted for acute right frontal lobe infarct with symptomatic right MCA stenosis status post IR stenting in the right distal M1 MCA on 05/11/2021.  Pt denies any dysuria and UA not significant for UTi.  Repeat imaging is negative for acute stroke. EEG is negative for seizures.  She will need outpatient follow up with cardiology for Holter monitor placement for evaluation of arrhythmias.   Discharge Diagnoses:  Principal Problem:   Syncope Active Problems:   Benign essential hypertension   Hyperlipidemia, mixed   DM II (diabetes mellitus, type II), controlled (HCC)   CVA (cerebral vascular accident) ([place])   Syncope Unclear etiology, differential include new acute stroke, vs seizures vs arrhythmias vs dehydration.  Reviewed CT head without contrast and MRI brain without contrast with the patient. EEG ordered, negative for active seizures or epileptiform disposition. No AED'S ordered.  Dicussed with the patient and the daughter that  she cannot drive.  No arrhythmias on telemetry. She will need outpatient follow up with cardiology for Holter monitor placement for evaluation of arrhythmias.  Meanwhile on admission her creatinine was around 1.07, baseline around 0.7. with gentle hydration, her creatinine has improved to 0.68. she denies any dizziness today or when working with PT.  UA shows trace leukocytes with rare bacteria but she denies any dysuria or frenquecy or urgency.  Last echocardiogram showed LVEF 60 to 86%, diastolic parameters are indeterminate. LDL 119, hemoglobin A1c is 7.1 Pt on aspirin, plavix and lipitor.continue the same.  Neurology consulted. Therapy evaluations recommending home health PT.     Vitamin b12 deficiency: - supplementation ordered.     Hypertension: Restart home meds on discharge.      Right orbit contusion: Pain resolved.  Sec to fall. Monitor.       Type 2 DM; Non insulin dependent,  CBG (last 3)  Recent Labs    06/13/21 0820 06/13/21 1202 06/13/21 1534  GLUCAP 133* 177* 101*   Resume home meds on discharge.        Hypothyroidism: Continue with synthroid. Thyroid panel wnl.    Hyperlipidemia: Last LDL is 119, continue with LIPITOR.     Depression; Continue with Celexa.     Recently diagnosed with CVA  S/p right MCA stent placement, discharged on aspirin, plavix and lipitor.    Discharge Instructions  Discharge Instructions     Diet - low sodium heart healthy   Complete by: As directed    Discharge instructions   Complete by: As directed    Please follow up with neurology in 2 to 4 weeks.  Please do not drive , in view of your syncope diagnosis. You will  need to be cleared by neurology prior to start driving.  Please follow up with PCp in one week.      Allergies as of 06/13/2021   No Known Allergies      Medication List     STOP taking these medications    meloxicam 15 MG tablet Commonly known as: MOBIC   Vitamin D (Ergocalciferol)  1.25 MG (50000 UNIT) Caps capsule Commonly known as: DRISDOL       TAKE these medications    acetaminophen 325 MG tablet Commonly known as: TYLENOL Take 2 tablets (650 mg total) by mouth every 6 (six) hours as needed for mild pain, fever or headache.   acidophilus Caps capsule Take 2 capsules by mouth 3 (three) times daily.   aspirin 81 MG EC tablet Take 1 tablet (81 mg total) by mouth daily. Swallow whole.   atorvastatin 20 MG tablet Commonly known as: LIPITOR Take 4 tablets (80 mg total) by mouth at bedtime.   citalopram 10 MG tablet Commonly known as: CELEXA Take 1 tablet (10 mg total) by mouth daily.   clopidogrel 75 MG tablet Commonly known as: PLAVIX Take 1 tablet (75 mg total) by mouth daily.   glipiZIDE 5 MG tablet Commonly known as: GLUCOTROL Take 1 tablet (5 mg total) by mouth 2 (two) times daily before a meal.   levothyroxine 125 MCG tablet Commonly known as: SYNTHROID Take 1 tablet (125 mcg total) by mouth daily at 6 (six) AM.   lisinopril 20 MG tablet Commonly known as: ZESTRIL Take 1 tablet (20 mg total) by mouth daily.   melatonin 3 MG Tabs tablet Take 3 mg by mouth at bedtime.   metFORMIN 1000 MG tablet Commonly known as: GLUCOPHAGE Take 1 tablet (1,000 mg total) by mouth 2 (two) times daily with a meal.   Onglyza 5 MG Tabs tablet Generic drug: saxagliptin HCl Take 5 mg by mouth daily.   polycarbophil 625 MG tablet Commonly known as: FIBERCON Take 1 tablet (625 mg total) by mouth 2 (two) times daily.   vitamin B-12 500 MCG tablet Commonly known as: CYANOCOBALAMIN Take 1 tablet (500 mcg total) by mouth daily. Start taking on: June 14, 2021        [place], [facility]. Schedule an appointment as soon as possible for a visit in 1 week(s).   Specialty: General Practice Contact information: [address] [place] [address] [place]. Schedule an  appointment as soon as possible for a visit in 1 week(s).   Specialty: Cardiology Why: for holter monitor placement. Contact information: [address] [place] [phone]               No Known Allergies  Consultations: Neurology.    Procedures/Studies: CT ANGIO HEAD NECK W WO CM  Result Date: 06/12/2021 CLINICAL DATA:  Stroke, follow-up. EXAM: CT ANGIOGRAPHY HEAD AND NECK TECHNIQUE: Multidetector CT imaging of the head and neck was performed using the standard protocol during bolus administration of intravenous contrast. Multiplanar CT image reconstructions and MIPs were obtained to evaluate the vascular anatomy. Carotid stenosis measurements (when applicable) are obtained utilizing NASCET criteria, using the distal internal carotid diameter as the denominator. CONTRAST:  76mL OMNIPAQUE IOHEXOL 350 MG/ML SOLN COMPARISON:  Brain MRI 06/12/2021. Head CT 06/11/2021. Brain MRI 05/08/2021. CT angiogram head 05/09/2021. FINDINGS: CT HEAD FINDINGS Brain: Mild generalized cerebral and cerebellar  atrophy. Redemonstrated evolving subacute infarcts within the right frontal lobe corona radiata. Small subacute appearing infarcts within the right thalamocapsular junction were better appreciated on the brain MRI performed earlier today. Known chronic infarcts within the left basal ganglia, left occipital cortex and right thalamus. Background mild patchy and ill-defined hypoattenuation within the cerebral white matter, nonspecific but compatible with chronic small vessel ischemic disease. There is no acute intracranial hemorrhage. No extra-axial fluid collection. No evidence of an intracranial mass. No midline shift. Vascular: Stent now present in the region of the M1 and M2 right middle cerebral artery. Elsewhere, no hyperdense vessel is identified. Atherosclerotic calcifications. Skull: Normal. Negative for fracture or focal lesion. Sinuses: No significant  paranasal sinus disease. Orbits: No mass or acute finding. Right periorbital soft tissue swelling. Review of the MIP images confirms the above findings CTA NECK FINDINGS Aortic arch: Standard aortic branching. Atherosclerotic plaque within the visualized aortic arch and proximal major branch vessels of the neck. No hemodynamically significant innominate or proximal subclavian artery stenosis. Right carotid system: CCA and ICA patent within the neck without significant stenosis (50% or greater). Mild soft plaque within the proximal ICA. Left carotid system: CCA and ICA patent within the neck without significant stenosis (50% or greater). Mild soft and calcified plaque within the proximal ICA. Vertebral arteries: The right vertebral arteries patent within the neck without hemodynamically significant stenosis. Only minimal and thready opacification is seen within the left vertebral artery within the neck, likely functionally occluded when correlating with the report from prior catheter based angiography 05/11/2021. Skeleton: Cervical spondylosis. No acute bony abnormality or aggressive osseous lesion. Other neck: No neck mass or cervical lymphadenopathy. Upper chest: No consolidation within the imaged lung apices. Review of the MIP images confirms the above findings CTA HEAD FINDINGS Anterior circulation: The intracranial internal carotid arteries are patent. Atherosclerotic plaque within both vessels. Up to moderate stenosis within the distal cavernous/paraclinoid right ICA. No more than mild stenosis of the intracranial left ICA. Redemonstrated 50% stenosis at the origin of the right middle cerebral artery. There has been interval stenting of the M1/M2 right middle cerebral artery. The stent appears patent. There is limited assessment for stenoses in the region of the radiopaque stent markers. No right M2 proximal branch occlusion is identified distal to the stent. The M1 left middle cerebral artery is patent.  Redemonstrated sites of up to moderate stenosis within a mid M2 left MCA branches (series 15, image 36). The anterior cerebral arteries are patent. No intracranial aneurysm is identified. Posterior circulation: The V4 right vertebral artery is patent without stenosis. Enhancement within the non dominant intracranial left vertebral artery, likely due to retrograde flow. The basilar artery is patent. The posterior cerebral arteries are patent. A left posterior communicating artery is present. Redemonstrated severe stenosis within the left PCA P2 segment. The right posterior communicating artery is hypoplastic or absent. Venous sinuses: Within the limitations of contrast timing, no convincing thrombus. Anatomic variants: As described Review of the MIP images confirms the above findings IMPRESSION: CT head: 1. Redemonstrated evolving subacute infarcts within the right frontal lobe corona radiata. 2. Known subacute infarcts within the right thalamocapsular junction were better appreciated on the brain MRI performed earlier today. 3. Known chronic infarcts within the left basal ganglia, right thalamus and left occipital cortex. 4. Background mild generalized parenchymal atrophy and cerebral white matter chronic small vessel ischemic disease. 5. Redemonstrated right periorbital soft tissue swelling. CTA neck: 1. The bilateral common carotid and internal carotid arteries are  patent within the neck without hemodynamically significant stenosis (50% or greater). Mild atherosclerotic plaque within the proximal internal carotid arteries bilaterally. 2. Only minimal and thready opacification of the left vertebral artery within the neck. This vessel is likely functionally occluded (correlating with the findings on catheter-based angiography performed 05/11/2021). 3. Right vertebral artery patent within the neck without stenosis. CTA head: 1. A stent traverses the M1/M2 right middle cerebral artery. The stent appears patent. There  is limited assessment for stenoses in the region of the radiopaque stent markers. No right M2 proximal branch occlusion is identified distal to the stent. 2. Additional intracranial atherosclerotic disease, as described and otherwise unchanged. Findings are most notably as follows. 3. Up to moderate stenosis within the distal cavernous/paraclinoid right ICA. 4. Moderate stenosis at the origin of the M1 right middle cerebral artery. 5. Sites of up to moderate stenosis within mid M2 left MCA vessels. 6. Severe stenosis within the P2 left PCA. 7. Enhancement is seen in the non dominant intracranial left vertebral artery, likely due to retrograde flow. Electronically Signed   By: Kellie Simmering DO   On: 06/12/2021 19:18   X-ray chest PA and lateral  Result Date: 06/11/2021 CLINICAL DATA:  Syncope, hypertension, diabetes EXAM: CHEST - 2 VIEW COMPARISON:  05/23/2021 FINDINGS: Frontal and lateral views of the chest demonstrate an unremarkable cardiac silhouette. No airspace disease, effusion, or pneumothorax. No acute bony abnormalities. IMPRESSION: 1. No acute intrathoracic process. Electronically Signed   By: Randa Ngo M.D.   On: 06/11/2021 22:09   DG Chest 2 View  Result Date: 05/23/2021 CLINICAL DATA:  Tachypnea. EXAM: CHEST - 2 VIEW COMPARISON:  February 11, 2010. FINDINGS: The heart size and mediastinal contours are within normal limits. Both lungs are clear. The visualized skeletal structures are unremarkable. IMPRESSION: No active cardiopulmonary disease. Electronically Signed   By: Marijo Conception M.D.   On: 05/23/2021 18:46   DG Abd 1 View  Result Date: 05/25/2021 CLINICAL DATA:  Vomiting. EXAM: ABDOMEN - 1 VIEW COMPARISON:  No recent prior. FINDINGS: Right hemidiaphragm incompletely imaged. Surgical clips left upper quadrant. Soft tissue structures are unremarkable. No bowel distention or free air. Degenerative change lumbar spine. IMPRESSION: No acute abnormality.  No bowel distention.  Electronically Signed   By: Marcello Moores  Register   On: 05/25/2021 16:01   CT Head Wo Contrast  Result Date: 06/11/2021 CLINICAL DATA:  Mental status change.  Near syncope. EXAM: CT HEAD WITHOUT CONTRAST CT CERVICAL SPINE WITHOUT CONTRAST TECHNIQUE: Multidetector CT imaging of the head and cervical spine was performed following the standard protocol without intravenous contrast. Multiplanar CT image reconstructions of the cervical spine were also generated. COMPARISON:  CT head 06/08/2021 FINDINGS: CT HEAD FINDINGS Brain: Hypodensity right frontal white matter compatible with recent infarct. This was present previously. Negative for new or acute infarct. No hemorrhage or mass. Chronic infarct left anterior basal ganglia. Negative for hydrocephalus. Vascular: Interval placement of right MCA stent in the right M1/ M2 region. Negative for hyperdense vessel Skull: Negative Sinuses/Orbits: Negative Other: Contusion lateral to the right orbit which was not present previously. This is presumably posttraumatic. CT CERVICAL SPINE FINDINGS Alignment: Normal alignment with straightening of the cervical lordosis. Skull base and vertebrae: Negative for fracture Soft tissues and spinal canal: Negative Disc levels: Disc degeneration and spondylosis C3 through C7. Mild spinal stenosis C4-5, C5-6, C6-7. Upper chest: Lung apices clear bilaterally. Other: None IMPRESSION: 1. No acute intracranial abnormality. Recent infarct right frontal white matter. Recent placement  of right MCA stent 2. Soft tissue contusion lateral the right orbit presumably from recent injury. 3. Negative for cervical spine fracture. Electronically Signed   By: Franchot Gallo M.D.   On: 06/11/2021 19:16   CT Cervical Spine Wo Contrast  Result Date: 06/11/2021 CLINICAL DATA:  Mental status change.  Near syncope. EXAM: CT HEAD WITHOUT CONTRAST CT CERVICAL SPINE WITHOUT CONTRAST TECHNIQUE: Multidetector CT imaging of the head and cervical spine was performed  following the standard protocol without intravenous contrast. Multiplanar CT image reconstructions of the cervical spine were also generated. COMPARISON:  CT head 06/08/2021 FINDINGS: CT HEAD FINDINGS Brain: Hypodensity right frontal white matter compatible with recent infarct. This was present previously. Negative for new or acute infarct. No hemorrhage or mass. Chronic infarct left anterior basal ganglia. Negative for hydrocephalus. Vascular: Interval placement of right MCA stent in the right M1/ M2 region. Negative for hyperdense vessel Skull: Negative Sinuses/Orbits: Negative Other: Contusion lateral to the right orbit which was not present previously. This is presumably posttraumatic. CT CERVICAL SPINE FINDINGS Alignment: Normal alignment with straightening of the cervical lordosis. Skull base and vertebrae: Negative for fracture Soft tissues and spinal canal: Negative Disc levels: Disc degeneration and spondylosis C3 through C7. Mild spinal stenosis C4-5, C5-6, C6-7. Upper chest: Lung apices clear bilaterally. Other: None IMPRESSION: 1. No acute intracranial abnormality. Recent infarct right frontal white matter. Recent placement of right MCA stent 2. Soft tissue contusion lateral the right orbit presumably from recent injury. 3. Negative for cervical spine fracture. Electronically Signed   By: Franchot Gallo M.D.   On: 06/11/2021 19:16   MR BRAIN WO CONTRAST  Result Date: 06/12/2021 CLINICAL DATA:  Initial evaluation for recurrent syncope, recent infarction, status post angioplasty and stenting. EXAM: MRI HEAD WITHOUT CONTRAST TECHNIQUE: Multiplanar, multiecho pulse sequences of the brain and surrounding structures were obtained without intravenous contrast. COMPARISON:  Comparison made with prior CT from 06/11/2021 as well as multiple previous exams. FINDINGS: Brain: Cerebral volume stable, and remains within normal limits for age. Small remote infarcts involving the left basal ganglia and left occipital  cortex again seen, stable. Mild chronic microvascular ischemic disease noted involving the pons. Probable small remote lacunar infarct at the right thalamus. There has been continued interval evolution of recently identified subacute infarct involving the right frontal corona radiata, overall similar in size and distribution from previous. No evidence for hemorrhagic transformation or significant mass effect. There are 2 additional subtle foci of diffusion abnormality involving the right thalamic capsular region measuring up to 6 mm, not definitely seen on prior MRI, but also subacute in appearance. No associated hemorrhage. No other restricted diffusion to suggest acute or interval infarction. Gray-white matter differentiation otherwise maintained. No acute intracranial hemorrhage. No mass lesion, midline shift or mass effect. No hydrocephalus or extra-axial fluid collection. Pituitary gland suprasellar region normal. Midline structures intact. Vascular: Susceptibility artifact related to a vascular stent at the region of the right MCA bifurcation. Major intracranial vascular flow voids are otherwise maintained and stable. Skull and upper cervical spine: Craniocervical junction within normal limits. Degenerative spondylosis noted at C2-3 without high-grade stenosis. Bone marrow signal intensity normal. No scalp soft tissue abnormality. Sinuses/Orbits: Globes and orbital soft tissues within normal limits. Paranasal sinuses are clear. No mastoid effusion. Inner ear structures grossly normal. Other: None. IMPRESSION: 1. Continued interval evolution of recently identified subacute right frontal corona radiata infarct. No associated hemorrhage or significant mass effect. 2. Two additional subtle subcentimeter foci of diffusion abnormality involving the  right thalamocapsular region, not definitely seen on prior MRI, but also subacute in appearance. No associated hemorrhage. 3. Otherwise stable appearance of the brain  with no other acute intracranial abnormality. Chronic infarcts involving the left basal ganglia, left occipital cortex, and right thalamus. Electronically Signed   By: Jeannine Boga M.D.   On: 06/12/2021 01:13   EEG adult  Result Date: 06/13/2021 Greta Doom, MD     06/13/2021  9:27 AM History: 70 year old female being evaluated for transient loss of consciousness Sedation: None Technique: This is a 21 channel routine scalp EEG performed at the bedside with bipolar and monopolar montages arranged in accordance to the international 10/20 system of electrode placement. One channel was dedicated to EKG recording. Background: The background consists of intermixed alpha and beta activities. There is a well defined posterior dominant rhythm of 10 hz that attenuates with eye opening.  She has an increase in slow activity associated with drowsiness, but clear formed sleep structures are not observed.  She does have some irregular fronto-temporally predominant delta range activity present over the right hemisphere which is seen more prominently with drowsiness. Photic stimulation: Physiologic driving is present EEG Abnormalities: 1) mild right hemispheric slow activity Clinical Interpretation: This EEG is consistent with a mild nonspecific right hemispheric dysfunction consistent with the patient's known recent stroke. There was no seizure or seizure predisposition recorded on this study. Please note that lack of epileptiform activity on EEG does not preclude the possibility of epilepsy. Roland Rack, MD [facility] [phone] If 7pm- 7am, please page neurology on call as listed in [place].     Subjective: No chest pain or sob,dizziness.   Discharge Exam: Vitals:   06/13/21 1201 06/13/21 1536  BP: 117/86 (!) 165/81  Pulse: (!) 56 (!) 59  Resp: 16 20  Temp: 98.3 F (36.8 C) 98.4 F (36.9 C)  SpO2: 100% 100%   Vitals:   06/13/21 0525 06/13/21 0750 06/13/21 1201 06/13/21  1536  BP: (!) 179/60 (!) 155/52 117/86 (!) 165/81  Pulse: (!) 55 61 (!) 56 (!) 59  Resp: 16 17 16 20   Temp: 98.2 F (36.8 C) 98.1 F (36.7 C) 98.3 F (36.8 C) 98.4 F (36.9 C)  TempSrc: Oral  Oral Oral  SpO2: 99% 98% 100% 100%  Weight:      Height:        General: Pt is alert, awake, not in acute distress Cardiovascular: RRR, S1/S2 +, no rubs, no gallops Respiratory: CTA bilaterally, no wheezing, no rhonchi Abdominal: Soft, NT, ND, bowel sounds + Extremities: no edema, no cyanosis    The results of significant diagnostics from this hospitalization (including imaging, microbiology, ancillary and laboratory) are listed below for reference.     Microbiology: Recent Results (from the past 240 hour(s))  Resp Panel by RT-PCR (Flu A&B, Covid) Nasopharyngeal Swab     Status: None   Collection Time: 06/11/21  9:17 PM   Specimen: Nasopharyngeal Swab; Nasopharyngeal(NP) swabs in vial transport medium  Result Value Ref Range Status   SARS Coronavirus 2 by RT PCR NEGATIVE NEGATIVE Final    Comment: (NOTE) SARS-CoV-2 target nucleic acids are NOT DETECTED.  The SARS-CoV-2 RNA is generally detectable in upper respiratory specimens during the acute phase of infection. The lowest concentration of SARS-CoV-2 viral copies this assay can detect is 138 copies/mL. A negative result does not preclude SARS-Cov-2 infection and should not be used as the sole basis for treatment or other patient management decisions. A negative result may occur  with  improper specimen collection/handling, submission of specimen other than nasopharyngeal swab, presence of viral mutation(s) within the areas targeted by this assay, and inadequate number of viral copies(<138 copies/mL). A negative result must be combined with clinical observations, patient history, and epidemiological information. The expected result is Negative.  Fact Sheet for Patients:  EntrepreneurPulse.com.au  Fact Sheet for  Healthcare Providers:  IncredibleEmployment.be  This test is no t yet approved or cleared by the [place] FDA and  has been authorized for detection and/or diagnosis of SARS-CoV-2 by FDA under an Emergency Use Authorization (EUA). This EUA will remain  in effect (meaning this test can be used) for the duration of the COVID-19 declaration under Section 564(b)(1) of the Act, 21 U.S.C.section 360bbb-3(b)(1), unless the authorization is terminated  or revoked sooner.       Influenza A by PCR NEGATIVE NEGATIVE Final   Influenza B by PCR NEGATIVE NEGATIVE Final    Comment: (NOTE) The Xpert Xpress SARS-CoV-2/FLU/RSV plus assay is intended as an aid in the diagnosis of influenza from Nasopharyngeal swab specimens and should not be used as a sole basis for treatment. Nasal washings and aspirates are unacceptable for Xpert Xpress SARS-CoV-2/FLU/RSV testing.  Fact Sheet for Patients: EntrepreneurPulse.com.au  Fact Sheet for Healthcare Providers: IncredibleEmployment.be  This test is not yet approved or cleared by the [place] FDA and has been authorized for detection and/or diagnosis of SARS-CoV-2 by FDA under an Emergency Use Authorization (EUA). This EUA will remain in effect (meaning this test can be used) for the duration of the COVID-19 declaration under Section 564(b)(1) of the Act, 21 U.S.C. section 360bbb-3(b)(1), unless the authorization is terminated or revoked.  Performed at [facility], [address] [place], [place] [address]      Labs: BNP (last 3 results) No results for input(s): BNP in the last 8760 hours. Basic Metabolic Panel: Recent Labs  Lab 06/11/21 1810 06/12/21 0610 06/13/21 0421  NA 136 136 139  K 4.3 3.5 4.0  CL 107 106 105  CO2 21* 24 25  GLUCOSE 151* 65* 101*  BUN 27* 22 14  CREATININE 1.07* 0.74 0.68  CALCIUM 9.0 8.5* 8.6*   Liver Function Tests: Recent Labs  Lab  06/12/21 0610  AST 16  ALT 17  ALKPHOS 58  BILITOT 0.5  PROT 6.2*  ALBUMIN 3.2*   No results for input(s): LIPASE, AMYLASE in the last 168 hours. No results for input(s): AMMONIA in the last 168 hours. CBC: Recent Labs  Lab 06/11/21 1810 06/12/21 0610 06/13/21 0614  WBC 12.1* 9.2 6.1  HGB 10.2* 10.1* 10.0*  HCT 31.7* 30.7* 31.2*  MCV 81.1 79.7* 78.8*  PLT 285 259 255   Cardiac Enzymes: No results for input(s): CKTOTAL, CKMB, CKMBINDEX, TROPONINI in the last 168 hours. BNP: Invalid input(s): POCBNP CBG: Recent Labs  Lab 06/12/21 1623 06/12/21 2021 06/13/21 0820 06/13/21 1202 06/13/21 1534  GLUCAP 71 113* 133* 177* 101*   D-Dimer No results for input(s): DDIMER in the last 72 hours. Hgb A1c No results for input(s): HGBA1C in the last 72 hours. Lipid Profile No results for input(s): CHOL, HDL, LDLCALC, TRIG, CHOLHDL, LDLDIRECT in the last 72 hours. Thyroid function studies Recent Labs    06/13/21 0421  TSH 1.282   Anemia work up Recent Labs    06/12/21 0610 06/13/21 0421  VITAMINB12 127* 193   Urinalysis    Component Value Date/Time   COLORURINE YELLOW (A) 06/11/2021 1810   APPEARANCEUR CLEAR (A) 06/11/2021  1810   LABSPEC 1.009 06/11/2021 1810   PHURINE 5.0 06/11/2021 1810   GLUCOSEU NEGATIVE 06/11/2021 1810   HGBUR NEGATIVE 06/11/2021 1810   BILIRUBINUR NEGATIVE 06/11/2021 1810   KETONESUR NEGATIVE 06/11/2021 1810   PROTEINUR NEGATIVE 06/11/2021 1810   NITRITE NEGATIVE 06/11/2021 1810   LEUKOCYTESUR TRACE (A) 06/11/2021 1810   Sepsis Labs Invalid input(s): PROCALCITONIN,  WBC,  LACTICIDVEN Microbiology Recent Results (from the past 240 hour(s))  Resp Panel by RT-PCR (Flu A&B, Covid) Nasopharyngeal Swab     Status: None   Collection Time: 06/11/21  9:17 PM   Specimen: Nasopharyngeal Swab; Nasopharyngeal(NP) swabs in vial transport medium  Result Value Ref Range Status   SARS Coronavirus 2 by RT PCR NEGATIVE NEGATIVE Final    Comment:  (NOTE) SARS-CoV-2 target nucleic acids are NOT DETECTED.  The SARS-CoV-2 RNA is generally detectable in upper respiratory specimens during the acute phase of infection. The lowest concentration of SARS-CoV-2 viral copies this assay can detect is 138 copies/mL. A negative result does not preclude SARS-Cov-2 infection and should not be used as the sole basis for treatment or other patient management decisions. A negative result may occur with  improper specimen collection/handling, submission of specimen other than nasopharyngeal swab, presence of viral mutation(s) within the areas targeted by this assay, and inadequate number of viral copies(<138 copies/mL). A negative result must be combined with clinical observations, patient history, and epidemiological information. The expected result is Negative.  Fact Sheet for Patients:  EntrepreneurPulse.com.au  Fact Sheet for Healthcare Providers:  IncredibleEmployment.be  This test is no t yet approved or cleared by the [place] FDA and  has been authorized for detection and/or diagnosis of SARS-CoV-2 by FDA under an Emergency Use Authorization (EUA). This EUA will remain  in effect (meaning this test can be used) for the duration of the COVID-19 declaration under Section 564(b)(1) of the Act, 21 U.S.C.section 360bbb-3(b)(1), unless the authorization is terminated  or revoked sooner.       Influenza A by PCR NEGATIVE NEGATIVE Final   Influenza B by PCR NEGATIVE NEGATIVE Final    Comment: (NOTE) The Xpert Xpress SARS-CoV-2/FLU/RSV plus assay is intended as an aid in the diagnosis of influenza from Nasopharyngeal swab specimens and should not be used as a sole basis for treatment. Nasal washings and aspirates are unacceptable for Xpert Xpress SARS-CoV-2/FLU/RSV testing.  Fact Sheet for Patients: EntrepreneurPulse.com.au  Fact Sheet for Healthcare  Providers: IncredibleEmployment.be  This test is not yet approved or cleared by the [place] FDA and has been authorized for detection and/or diagnosis of SARS-CoV-2 by FDA under an Emergency Use Authorization (EUA). This EUA will remain in effect (meaning this test can be used) for the duration of the COVID-19 declaration under Section 564(b)(1) of the Act, 21 U.S.C. section 360bbb-3(b)(1), unless the authorization is terminated or revoked.  Performed at [facility], [address], [place], [place] [address]      Time coordinating discharge: 39 minutes.   SIGNED:   Hosie Poisson, MD  [facility] 06/13/2021, 4:18 PM

## 2021-06-13 NOTE — Progress Notes (Signed)
EEG was unremarkable and CTA with patent stent. She is awake, alert, appropriate today. Syncope was of unclear etiology, but with no definite report of convulsion and normal EEG I would not start antiepileptics. I did advise her again not to drive.    She can follow up as previously scheduled with neurology. Please call with further questions or concerns.   Roland Rack, MD [facility] [phone]  If 7pm- 7am, please page neurology on call as listed in [place].

## 2021-06-13 NOTE — TOC Progression Note (Signed)
Transition of Care [facility]) - Progression Note    Patient Details  Name: Madison Davidson MRN: [id number] Date of Birth: 14-Nov-1951  Transition of Care [facility]) CM/SW [place], RN Phone Number: 06/13/2021, 4:30 PM  Clinical Narrative:   Patient being discharged today, 3 n 1 ordered with Suanne Marker from [employer] to be delivered to patient's room         Expected Discharge Plan and Services           Expected Discharge Date: 06/13/21                                     Social Determinants of Health (SDOH) Interventions    Readmission Risk Interventions No flowsheet data found.

## 2021-06-13 NOTE — Progress Notes (Signed)
Called patient's daughter Cassandria Santee to set up discharge. Left message at [phone] about discharge arrangements. I left number to the unit for her to call back. We are waiting for delivery of DME at this time.

## 2021-06-14 LAB — T3, FREE: T3, Free: 2.5 pg/mL (ref 2.0–4.4)

## 2021-06-19 ENCOUNTER — Telehealth: Payer: Self-pay | Admitting: *Deleted

## 2021-06-19 NOTE — Telephone Encounter (Signed)
[facility] called for a POC of 2wk1, 1wk4.  Approval given.

## 2021-07-20 ENCOUNTER — Telehealth: Payer: Self-pay

## 2021-07-20 NOTE — Telephone Encounter (Addendum)
Elder Cyphers with [facility] is seeking a verbal order to re-certification of Physical Therapy. Also for home visits one a week for 9 weeks.   Please advise if you approve of visits. Patient no-showed on 06/06/2021.   Per [facility] patient had a fall while out shopping on 07/16/2010. No major injury noted.

## 2021-07-21 NOTE — Telephone Encounter (Signed)
Approval given

## 2021-07-21 NOTE — Telephone Encounter (Signed)
Opened in error

## 2021-08-10 NOTE — Progress Notes (Deleted)
This encounter was created in error - please disregard.

## 2021-08-17 ENCOUNTER — Encounter
Payer: Medicare Other | Attending: Physical Medicine and Rehabilitation | Admitting: Physical Medicine and Rehabilitation

## 2022-01-24 ENCOUNTER — Observation Stay
Admission: EM | Admit: 2022-01-24 | Discharge: 2022-01-26 | Disposition: A | Discharge status: Home Health | Payer: Medicare Other | Attending: Internal Medicine | Admitting: Internal Medicine

## 2022-01-24 ENCOUNTER — Emergency Department: Payer: Medicare Other

## 2022-01-24 ENCOUNTER — Emergency Department
Admission: EM | Admit: 2022-01-24 | Discharge: 2022-01-24 | Disposition: A | Discharge status: Home / Self Care | Payer: Medicare Other | Source: Home / Self Care | Attending: Emergency Medicine | Admitting: Emergency Medicine

## 2022-01-24 ENCOUNTER — Encounter: Payer: Self-pay | Admitting: Emergency Medicine

## 2022-01-24 ENCOUNTER — Other Ambulatory Visit: Payer: Self-pay

## 2022-01-24 DIAGNOSIS — I10 Essential (primary) hypertension: Secondary | ICD-10-CM | POA: Insufficient documentation

## 2022-01-24 DIAGNOSIS — E11649 Type 2 diabetes mellitus with hypoglycemia without coma: Principal | ICD-10-CM

## 2022-01-24 DIAGNOSIS — I2510 Atherosclerotic heart disease of native coronary artery without angina pectoris: Secondary | ICD-10-CM | POA: Insufficient documentation

## 2022-01-24 DIAGNOSIS — Z7984 Long term (current) use of oral hypoglycemic drugs: Secondary | ICD-10-CM | POA: Insufficient documentation

## 2022-01-24 DIAGNOSIS — M25562 Pain in left knee: Secondary | ICD-10-CM | POA: Diagnosis present

## 2022-01-24 DIAGNOSIS — E162 Hypoglycemia, unspecified: Secondary | ICD-10-CM

## 2022-01-24 DIAGNOSIS — Z20822 Contact with and (suspected) exposure to covid-19: Secondary | ICD-10-CM | POA: Insufficient documentation

## 2022-01-24 DIAGNOSIS — R531 Weakness: Secondary | ICD-10-CM

## 2022-01-24 DIAGNOSIS — Z8673 Personal history of transient ischemic attack (TIA), and cerebral infarction without residual deficits: Secondary | ICD-10-CM

## 2022-01-24 DIAGNOSIS — Z85828 Personal history of other malignant neoplasm of skin: Secondary | ICD-10-CM | POA: Diagnosis not present

## 2022-01-24 DIAGNOSIS — E860 Dehydration: Secondary | ICD-10-CM | POA: Insufficient documentation

## 2022-01-24 DIAGNOSIS — N179 Acute kidney failure, unspecified: Secondary | ICD-10-CM | POA: Diagnosis present

## 2022-01-24 LAB — BASIC METABOLIC PANEL
Anion gap: 8 (ref 5–15)
Anion gap: 11 (ref 5–15)
BUN: 33 mg/dL — ABNORMAL HIGH (ref 8–23)
BUN: 24 mg/dL — ABNORMAL HIGH (ref 8–23)
CO2: 23 mmol/L (ref 22–32)
CO2: 20 mmol/L — ABNORMAL LOW (ref 22–32)
Calcium: 8.4 mg/dL — ABNORMAL LOW (ref 8.9–10.3)
Calcium: 9.1 mg/dL (ref 8.9–10.3)
Chloride: 106 mmol/L (ref 98–111)
Chloride: 107 mmol/L (ref 98–111)
Creatinine, Ser: 1.08 mg/dL — ABNORMAL HIGH (ref 0.44–1.00)
Creatinine, Ser: 1.01 mg/dL — ABNORMAL HIGH (ref 0.44–1.00)
GFR, Estimated: 55 mL/min — ABNORMAL LOW (ref >60)
GFR, Estimated: 59 mL/min — ABNORMAL LOW (ref >60)
Glucose, Bld: 172 mg/dL — ABNORMAL HIGH (ref 70–99)
Glucose, Bld: 50 mg/dL — ABNORMAL LOW (ref 70–99)
Potassium: 4.3 mmol/L (ref 3.5–5.1)
Potassium: 4.5 mmol/L (ref 3.5–5.1)
Sodium: 137 mmol/L (ref 135–145)
Sodium: 138 mmol/L (ref 135–145)

## 2022-01-24 LAB — CBC WITH DIFFERENTIAL/PLATELET
Abs Immature Granulocytes: 0.05 10*3/uL (ref 0.00–0.07)
Basophils Absolute: 0.1 10*3/uL (ref 0.0–0.1)
Basophils Relative: 1 %
Eosinophils Absolute: 0.1 10*3/uL (ref 0.0–0.5)
Eosinophils Relative: 1 %
HCT: 39.1 % (ref 36.0–46.0)
Hemoglobin: 12.5 g/dL (ref 12.0–15.0)
Immature Granulocytes: 0 %
Lymphocytes Relative: 18 %
Lymphs Abs: 2 10*3/uL (ref 0.7–4.0)
MCH: 26.5 pg (ref 26.0–34.0)
MCHC: 32 g/dL (ref 30.0–36.0)
MCV: 83 fL (ref 80.0–100.0)
Monocytes Absolute: 1.2 10*3/uL — ABNORMAL HIGH (ref 0.1–1.0)
Monocytes Relative: 10 %
Neutro Abs: 8.1 10*3/uL — ABNORMAL HIGH (ref 1.7–7.7)
Neutrophils Relative %: 70 %
Platelets: 334 10*3/uL (ref 150–400)
RBC: 4.71 MIL/uL (ref 3.87–5.11)
RDW: 14.9 % (ref 11.5–15.5)
WBC: 11.5 10*3/uL — ABNORMAL HIGH (ref 4.0–10.5)
nRBC: 0 % (ref 0.0–0.2)

## 2022-01-24 LAB — URINALYSIS, COMPLETE (UACMP) WITH MICROSCOPIC
Bacteria, UA: NONE SEEN
Bilirubin Urine: NEGATIVE
Glucose, UA: >=500 mg/dL — AB
Hgb urine dipstick: NEGATIVE
Ketones, ur: NEGATIVE mg/dL
Nitrite: NEGATIVE
Protein, ur: NEGATIVE mg/dL
Specific Gravity, Urine: 1.006 (ref 1.005–1.030)
pH: 5 (ref 5.0–8.0)

## 2022-01-24 LAB — COMPREHENSIVE METABOLIC PANEL
ALT: 14 U/L (ref 0–44)
AST: 25 U/L (ref 15–41)
Albumin: 3.9 g/dL (ref 3.5–5.0)
Alkaline Phosphatase: 73 U/L (ref 38–126)
Anion gap: 8 (ref 5–15)
BUN: 38 mg/dL — ABNORMAL HIGH (ref 8–23)
CO2: 24 mmol/L (ref 22–32)
Calcium: 9.1 mg/dL (ref 8.9–10.3)
Chloride: 107 mmol/L (ref 98–111)
Creatinine, Ser: 1.34 mg/dL — ABNORMAL HIGH (ref 0.44–1.00)
GFR, Estimated: 42 mL/min — ABNORMAL LOW (ref >60)
Glucose, Bld: 98 mg/dL (ref 70–99)
Potassium: 6 mmol/L — ABNORMAL HIGH (ref 3.5–5.1)
Sodium: 139 mmol/L (ref 135–145)
Total Bilirubin: 1.2 mg/dL (ref 0.3–1.2)
Total Protein: 7.5 g/dL (ref 6.5–8.1)

## 2022-01-24 LAB — CBG MONITORING, ED
Glucose-Capillary: 136 mg/dL — ABNORMAL HIGH (ref 70–99)
Glucose-Capillary: 240 mg/dL — ABNORMAL HIGH (ref 70–99)
Glucose-Capillary: 49 mg/dL — ABNORMAL LOW (ref 70–99)
Glucose-Capillary: 65 mg/dL — ABNORMAL LOW (ref 70–99)
Glucose-Capillary: 72 mg/dL (ref 70–99)

## 2022-01-24 LAB — RESP PANEL BY RT-PCR (FLU A&B, COVID) ARPGX2
Influenza A by PCR: NEGATIVE
Influenza B by PCR: NEGATIVE
SARS Coronavirus 2 by RT PCR: NEGATIVE

## 2022-01-24 LAB — TROPONIN I (HIGH SENSITIVITY)
Troponin I (High Sensitivity): 10 ng/L (ref <18)
Troponin I (High Sensitivity): 15 ng/L (ref <18)

## 2022-01-24 IMAGING — DX DG CHEST 1V PORT
1 series · 1 of 1 positions shown · non-contrast
Comparison: [DATE]

CLINICAL DATA: Generalized weakness.

EXAM:
PORTABLE CHEST 1 VIEW

[chest ap]
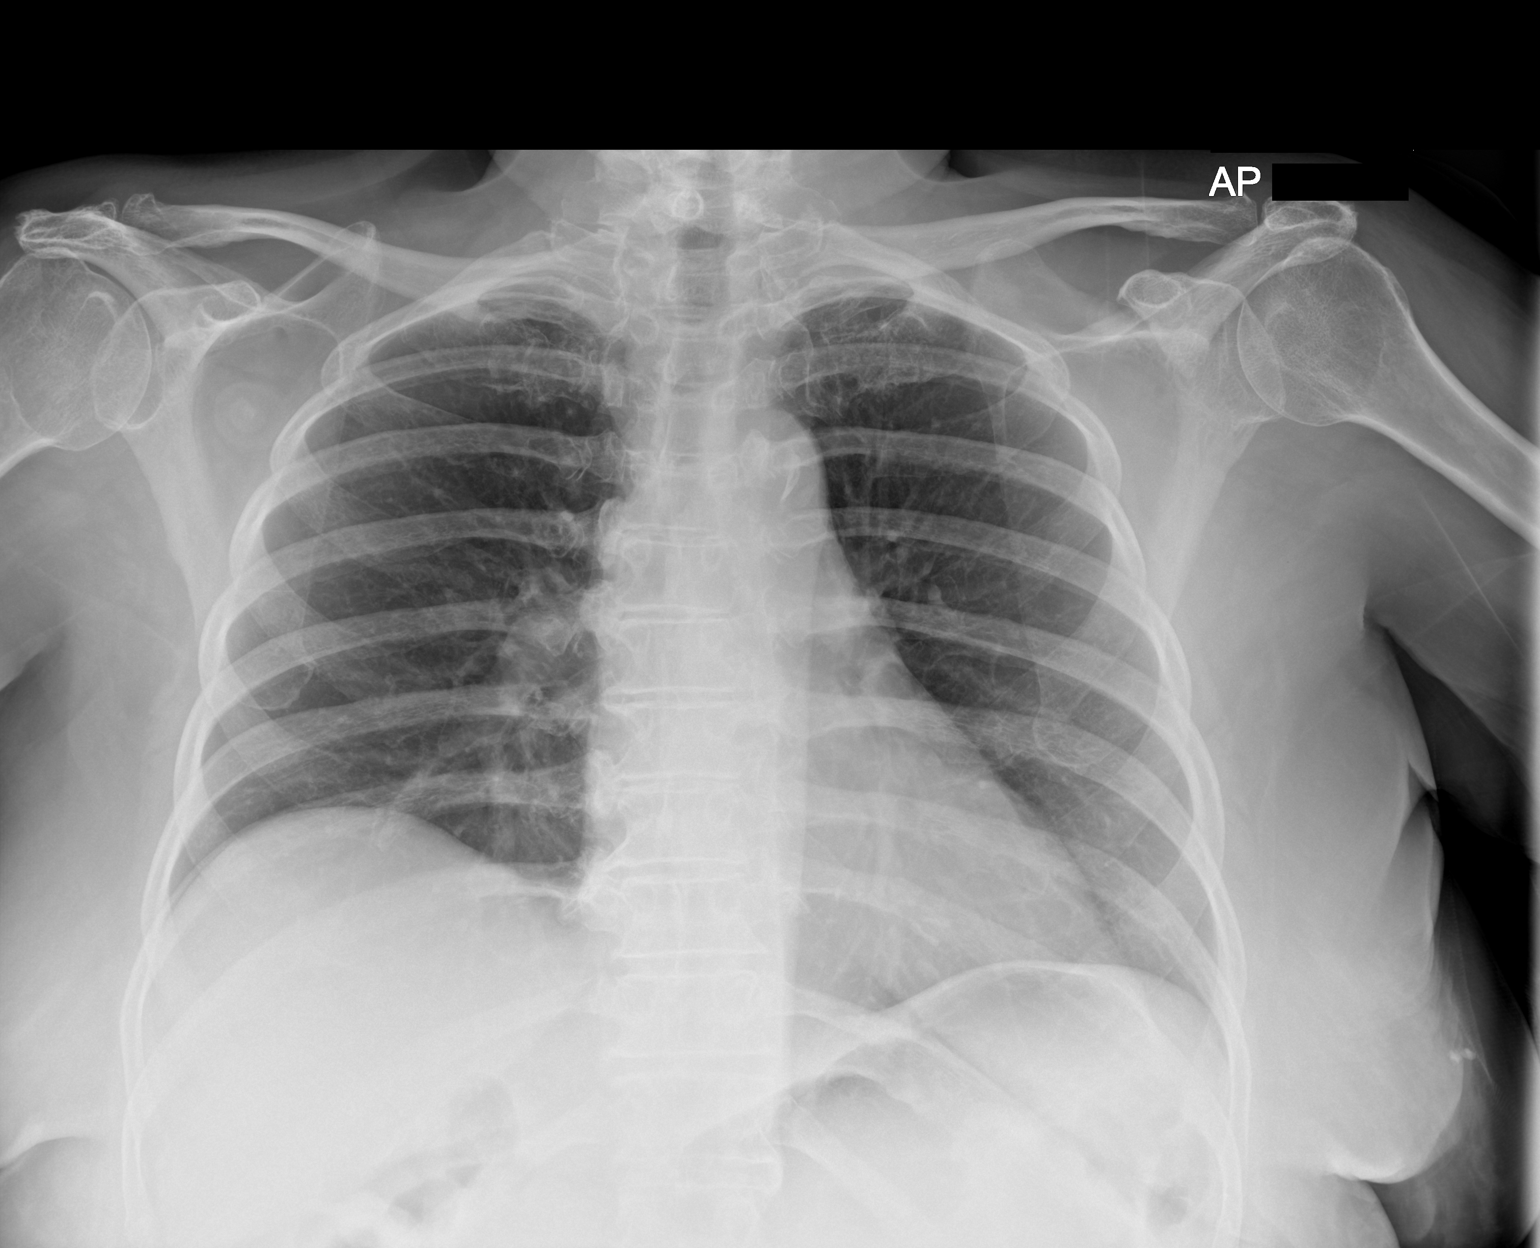

[1 of 1 positions shown; findings below may reference images not displayed]

FINDINGS: [AA] hours. The lungs are clear without focal pneumonia, edema,
pneumothorax or pleural effusion. The cardiopericardial silhouette
is within normal limits for size. The visualized bony structures of
the thorax are unremarkable.
IMPRESSION: No active disease.

## 2022-01-24 IMAGING — MR MR HEAD W/O CM
12 series · 46 of 48 positions shown · non-contrast
Comparison: MR and CT/CTA head and neck [DATE]

CLINICAL DATA: Generalized weakness, concern for stroke

EXAM:
MRI HEAD WITHOUT CONTRAST
TECHNIQUE: Multiplanar, multiecho pulse sequences of the brain and surrounding
structures were obtained without intravenous contrast.

[Series 5: ax dwi_tracew · axial · 3.0mm · 0.65mm/px · z∈[-129,+25]mm · 3 of 48 slices shown]
[im 1/48]
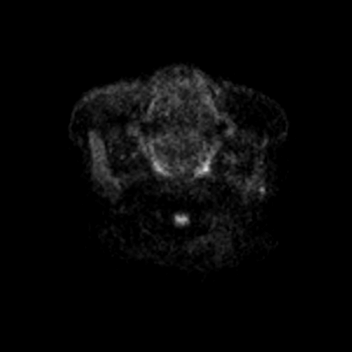
[im 24/48]
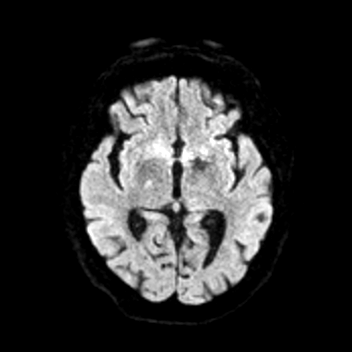
[im 48/48]
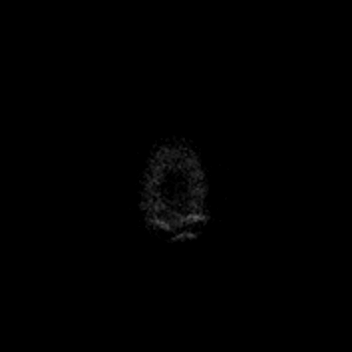

[Series 6: ax dwi_adc · axial · 3.0mm · 0.65mm/px · z∈[-129,+25]mm · 3 of 48 slices shown]
[im 1/48]
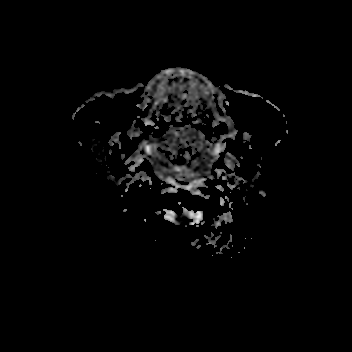
[im 24/48]
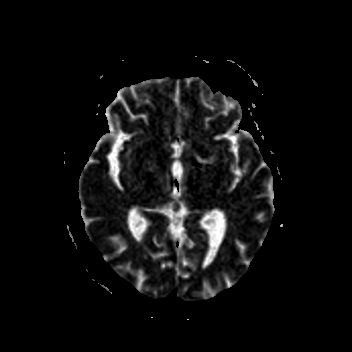
[im 48/48]
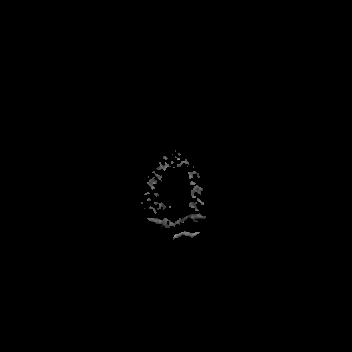

[Series 7: cor dwi_tracew · coronal · 5.0mm · 0.60mm/px · 3 of 34 slices shown]
[im 1/34]
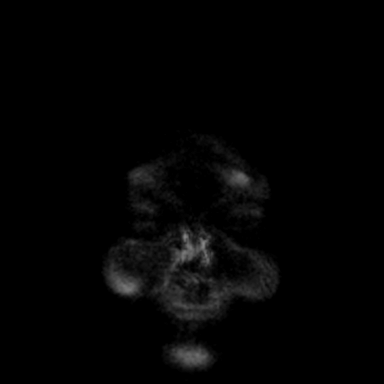
[im 17/34]
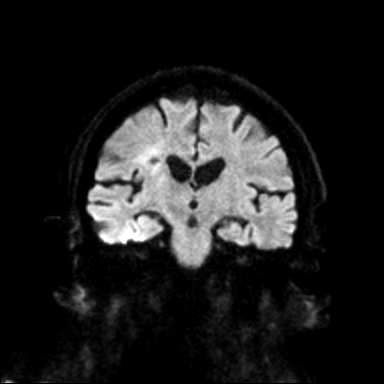
[im 34/34]
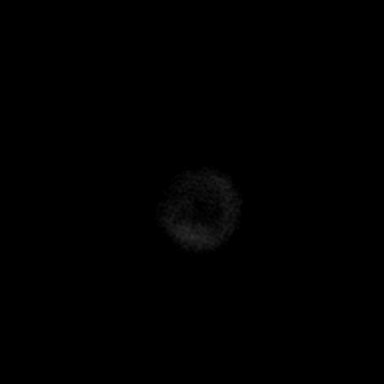

[Series 8: cor dwi_adc · coronal · 5.0mm · 0.60mm/px · 3 of 34 slices shown]
[im 1/34]
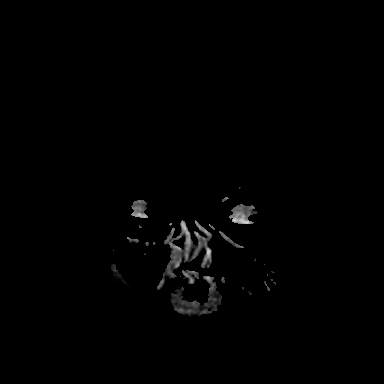
[im 17/34]
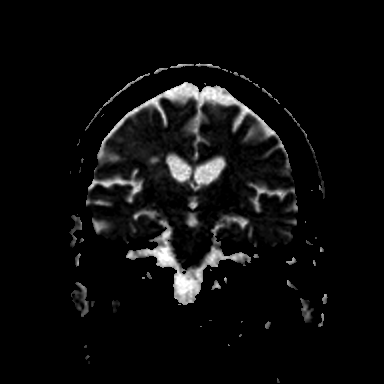
[im 34/34]
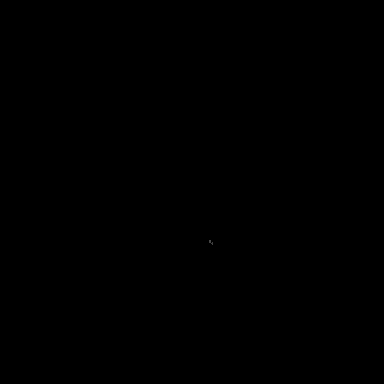

[Series 9: T1 · sagittal · 5.0mm · 0.62mm/px · 2 of 22 slices shown (1 of 2)]
[im 1/22]
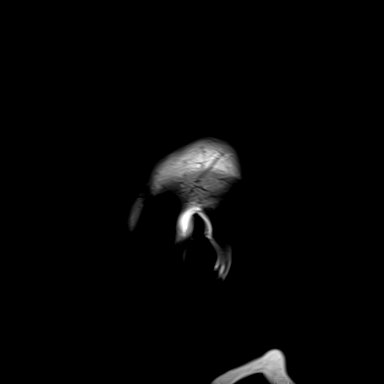
[im 22/22]
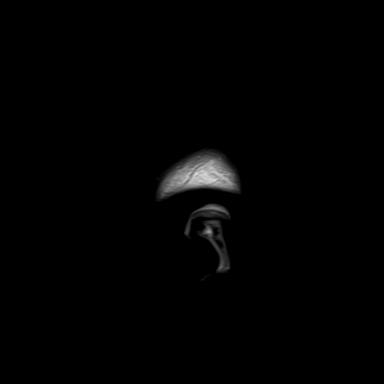

[Series 10: T2 · axial · 5.0mm · 0.53mm/px · z∈[-124,+19]mm · 2 of 25 slices shown (1 of 2)]
[im 1/25]
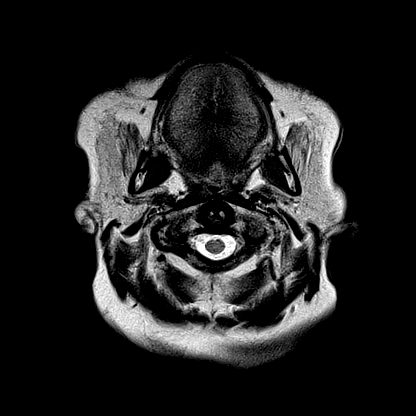
[im 25/25]
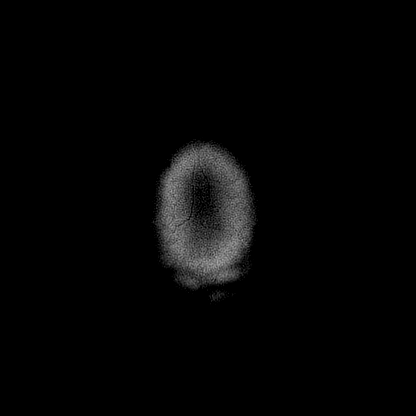

[Series 11: mag_images · axial · 3.0mm · 0.90mm/px · z∈[-140,+36]mm · 5 of 60 slices shown]
[im 1/60]
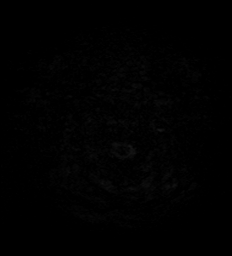
[im 15/60]
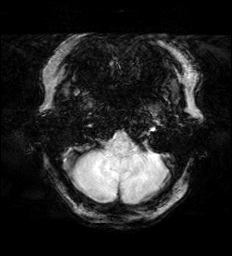
[im 30/60]
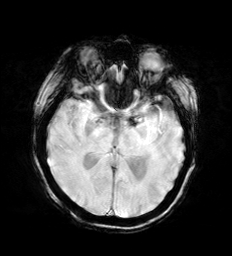
[im 45/60]
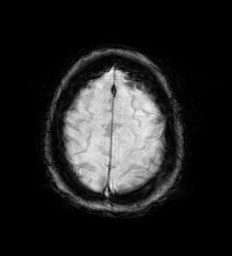
[im 60/60]
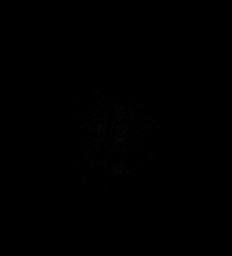

[Series 12: pha_images · axial · 3.0mm · 0.90mm/px · z∈[-140,+36]mm · 5 of 60 slices shown]
[im 1/60]
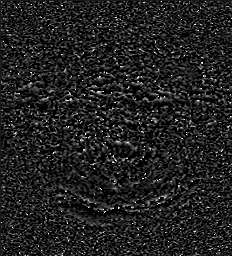
[im 15/60]
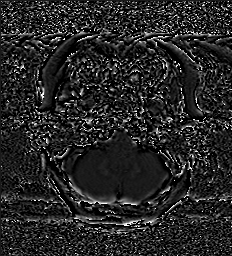
[im 30/60]
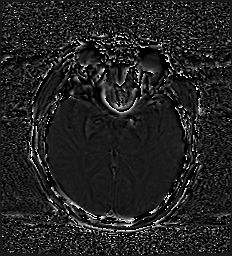
[im 45/60]
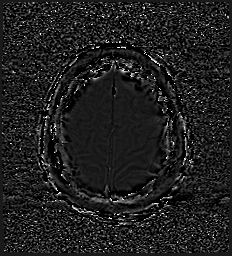
[im 60/60]
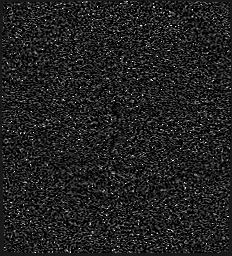

[Series 13: swi_images · axial · 3.0mm · 0.90mm/px · z∈[-140,+36]mm · 5 of 60 slices shown]
[im 1/60]
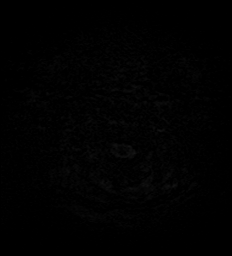
[im 15/60]
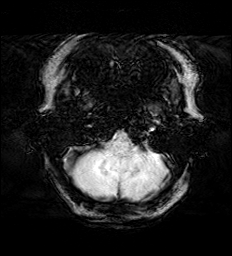
[im 30/60]
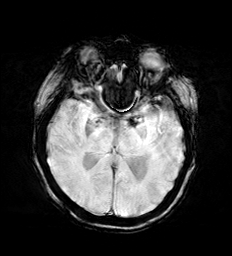
[im 45/60]
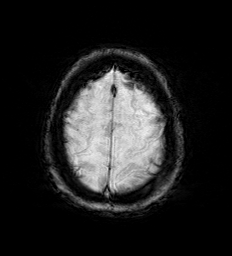
[im 60/60]
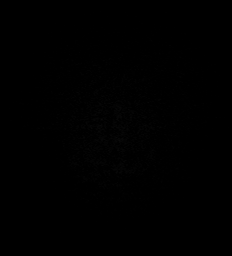

[Series 15: FLAIR · axial · 3.0mm · 0.53mm/px · z∈[-133,+28]mm · 4 of 55 slices shown]
[im 1/55]
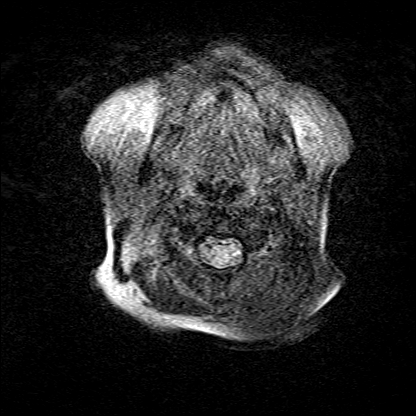
[im 19/55]
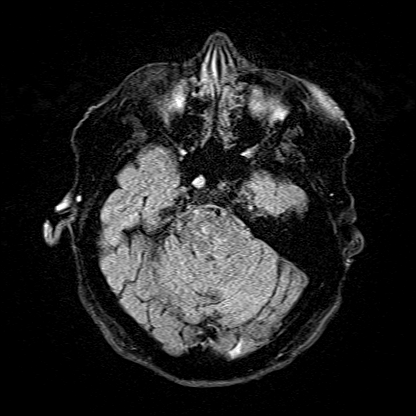
[im 37/55]
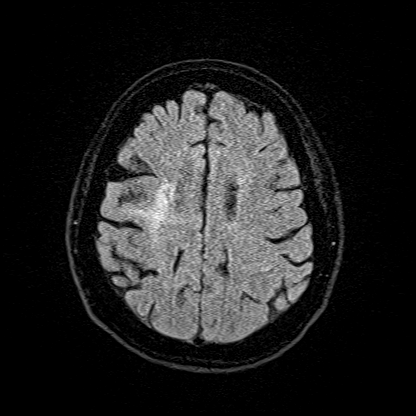
[im 55/55]
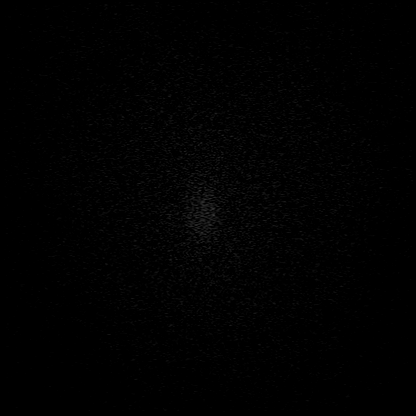

[Series 16: T1 · axial · 1.0mm · 0.98mm/px · z∈[-122,+20]mm · 9 of 144 slices shown (2 of 2)]
[im 1/144]
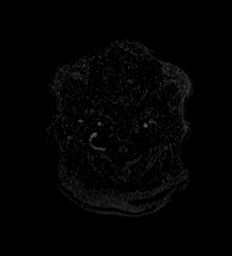
[im 15/144]
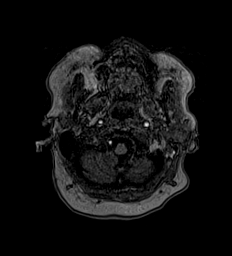
[im 29/144]
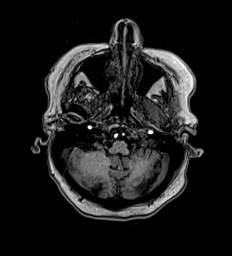
[im 43/144]
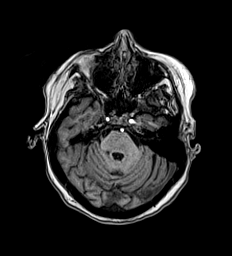
[im 58/144]
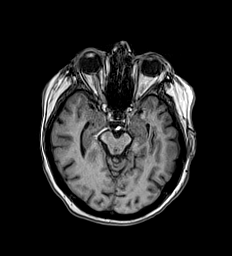
[im 86/144]
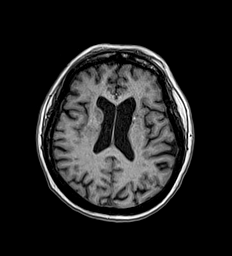
[im 101/144]
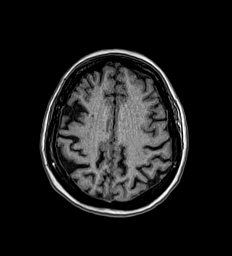
[im 115/144]
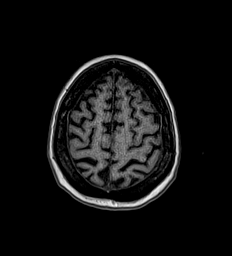
[im 144/144]
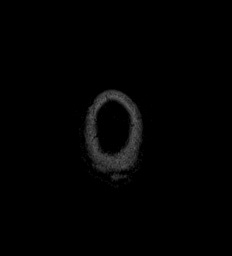

[Series 17: T2 · coronal · 5.0mm · 0.57mm/px · 2 of 29 slices shown (2 of 2)]
[im 1/29]
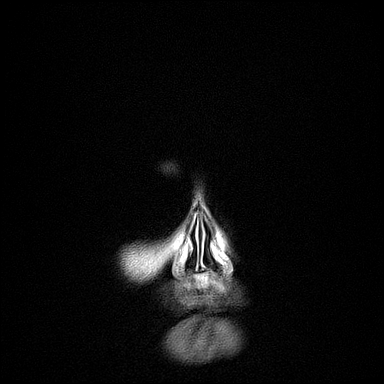
[im 29/29]
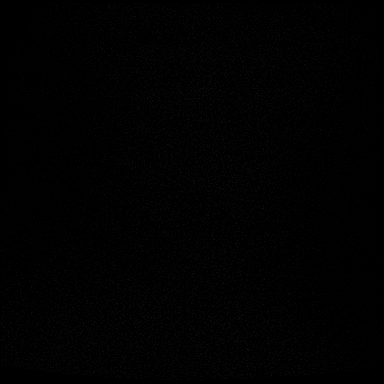

[46 of 48 positions shown; findings below may reference images not displayed]

FINDINGS: Brain: There is no evidence of acute intracranial hemorrhage,
extra-axial fluid collection, or acute infarct.

Background parenchymal volume is within normal limits. The
ventricles are normal in size. Remote infarcts are seen in the left
basal ganglia/caudate body and right corona radiata with foci of SWI
signal dropout consistent with chronic blood products, unchanged
compared to the prior MRI. Additional small infarcts are seen in the
right thalamus and left occipital cortex. There is a minimal burden
of white matter microangiopathic change aside from these findings.
There is a single punctate chronic microhemorrhage in the right
frontal lobe, nonspecific.

There is no solid mass lesion. There is no mass effect or midline
shift.

Vascular: Major intracranial flow voids are present. Susceptibility
artifact from a right MCA stent is noted.

Skull and upper cervical spine: Normal marrow signal.

Sinuses/Orbits: The paranasal sinuses are clear. The globes and
orbits are unremarkable.

Other: None.
IMPRESSION: 1. No acute intracranial pathology.
2. Multiple remote infarcts as above.

## 2022-01-24 MED ORDER — LACTATED RINGERS IV BOLUS
1000.0000 mL | Freq: Once | INTRAVENOUS | Status: AC
  Administered 2022-01-24: 1000 mL via INTRAVENOUS

## 2022-01-24 MED ORDER — DEXTROSE IN LACTATED RINGERS 5 % IV SOLN
500.0000 mL | Freq: Once | INTRAVENOUS | Status: AC
  Administered 2022-01-24: 500 mL via INTRAVENOUS

## 2022-01-24 MED ORDER — DEXTROSE IN LACTATED RINGERS 5 % IV SOLN
500.0000 mL | Freq: Once | INTRAVENOUS | Status: AC
  Administered 2022-01-25: 500 mL via INTRAVENOUS

## 2022-01-24 NOTE — ED Notes (Signed)
Pt assisted to the [facility] for urine specimen. Pt had a BM in the urine sample. Will attempt to obtain after fluid bolus. ?

## 2022-01-24 NOTE — ED Triage Notes (Signed)
Pt presents from home with complaints of weakness and the inability to get up from bed and ambulate around her home. Per EMS, the patients CBG was 51 and they provided the patient OJ which increased her CBG to 72. Of note, the patients states she developed a tremor starting last night. Denies CP or SOB. ? ?CBG -65  upon arrival. Pt provided OJ.  ?

## 2022-01-24 NOTE — Discharge Instructions (Addendum)
Please be very careful with your blood sugars.  Make sure you are eating. ? ?Call Dr. Nino Parsley office.  He is the vascular surgeon.  He will check on your leg to see why it got numb and cold.  The MRI does not show any new strokes so I do not think that was the problem.  Please return immediately if this happens again.  That is if the leg gets blue or cold or numb again. ? ?

## 2022-01-24 NOTE — ED Provider Notes (Signed)
Patient's blood sugars have been okay.  Patient does not have any urinary symptoms although she does have some white blood cells in her urine.  This is likely a asymptomatic bacteriuria. ? ?Patient did report that her left leg got cold and numb at around 3:00 this morning.  It is now still numb but not cold.  I discussed her with Dr. Delana Meyer who will follow her up in the office.  Dr. Quinn Axe the neurologist said if her MRI did not show a new stroke she should be able to go home.  She does not have a new stroke although she has some old ones.  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  I will let her go now and follow-up with Dr. Delana Meyer take her medicines. ?  ?Nena Polio, MD ?01/24/22 1511 ? ?

## 2022-01-24 NOTE — ED Provider Notes (Signed)
? ?[facility] ?Provider Note ? ? ? Event Date/Time  ? First MD Initiated Contact with Patient 01/24/22 0445   ?  (approximate) ? ? ?History  ? ?Weakness ? ? ?HPI ? ?TIAUNNA Davidson is a 71 y.o. female with a history of diabetes on glipizide and metformin, hypertension, stroke who presents for evaluation of generalized weakness.  Patient reports that she woke up this morning and was very weak and could not get up from the bed that is why she called 911.  Patient reports being in her usual state of health when she went to bed although she did have 1 episode of vomiting after dinner last night.  She reports that she ate and did not feel very good, vomited 1 time and felt better.  No chest pain, no shortness of breath, no fever, no abdominal pain, no dysuria or hematuria, no diarrhea.  When EMS arrived patient was found to be hypoglycemic.  Patient is not on insulin.  Denies any changes on her diabetes medication. ?  ? ? ?Past Medical History:  ?Diagnosis Date  ? Cancer [facility])   ? skin  ? Coronary artery disease   ? Diabetes mellitus without complication ([facility])   ? Hypertension   ? Stroke [facility])   ? ? ?Past Surgical History:  ?Procedure Laterality Date  ? ABDOMINAL HYSTERECTOMY    ? IR ANGIO INTRA EXTRACRAN SEL INTERNAL CAROTID BILAT MOD SED  05/11/2021  ? IR ANGIO VERTEBRAL SEL VERTEBRAL UNI R MOD SED  05/11/2021  ? IR ANGIOGRAM EXTREMITY LEFT  05/11/2021  ? IR CT HEAD LTD  05/11/2021  ? IR INTRA CRAN STENT  05/11/2021  ? IR INTRA CRAN STENT  05/12/2021  ? IR US GUIDE VASC ACCESS RIGHT  05/11/2021  ? RADIOLOGY WITH ANESTHESIA N/A 05/11/2021  ? Procedure: IR WITH ANESTHESIA - INTRACRANIAL STENT;  Surgeon: Pedro Earls, MD;  Location: [facility];  Service: Radiology;  Laterality: N/A;  ? ? ? ?Physical Exam  ? ?Triage Vital Signs: ?ED Triage Vitals  ?Enc Vitals Group  ?   BP   ?   Pulse   ?   Resp   ?   Temp   ?   Temp src   ?   SpO2   ?   Weight   ?   Height   ?   Head Circumference   ?   Peak  Flow   ?   Pain Score   ?   Pain Loc   ?   Pain Edu?   ?   Excl. in [place]?   ? ? ?Most recent vital signs: ?Vitals:  ? 01/24/22 0507 01/24/22 0600  ?BP: (!) 163/99 (!) 137/97  ?Pulse: 99 89  ?Resp: 20 18  ?Temp: 97.9 ?F (36.6 ?C)   ?SpO2: 100% 100%  ? ? ? ?Constitutional: Alert and oriented. Well appearing and in no apparent distress. ?HEENT: ?     Head: Normocephalic and atraumatic.    ?     Eyes: Conjunctivae are normal. Sclera is non-icteric.  ?     Mouth/Throat: Mucous membranes are moist.  ?     Neck: Supple with no signs of meningismus. ?Cardiovascular: Regular rate and rhythm. No murmurs, gallops, or rubs. 2+ symmetrical distal pulses are present in all extremities.  ?Respiratory: Normal respiratory effort. Lungs are clear to auscultation bilaterally.  ?Gastrointestinal: Soft, non tender, and non distended with positive bowel sounds. No rebound or guarding. ?Genitourinary: No CVA  tenderness. ?Musculoskeletal:  No edema, cyanosis, or erythema of extremities. ?Neurologic: Normal speech and language. Face is symmetric. Moving all extremities. No gross focal neurologic deficits are appreciated. ?Skin: Skin is warm, dry and intact. No rash noted. ?Psychiatric: Mood and affect are normal. Speech and behavior are normal. ? ?ED Results / Procedures / Treatments  ? ?Labs ?(all labs ordered are listed, but only abnormal results are displayed) ?Labs Reviewed  ?CBC WITH DIFFERENTIAL/PLATELET - Abnormal; Notable for the following components:  ?    Result Value  ? WBC 11.5 (*)   ? Neutro Abs 8.1 (*)   ? Monocytes Absolute 1.2 (*)   ? All other components within normal limits  ?COMPREHENSIVE METABOLIC PANEL - Abnormal; Notable for the following components:  ? Potassium 6.0 (*)   ? BUN 38 (*)   ? Creatinine, Ser 1.34 (*)   ? GFR, Estimated 42 (*)   ? All other components within normal limits  ?CBG MONITORING, ED - Abnormal; Notable for the following components:  ? Glucose-Capillary 65 (*)   ? All other components within normal  limits  ?CBG MONITORING, ED - Abnormal; Notable for the following components:  ? Glucose-Capillary 136 (*)   ? All other components within normal limits  ?RESP PANEL BY RT-PCR (FLU A&B, COVID) [id number]  ?URINALYSIS, COMPLETE (UACMP) WITH MICROSCOPIC  ?BASIC METABOLIC PANEL  ?CBG MONITORING, ED  ?CBG MONITORING, ED  ?TROPONIN I (HIGH SENSITIVITY)  ?TROPONIN I (HIGH SENSITIVITY)  ? ? ? ?EKG ? ?ED ECG REPORT ?I, Rudene Re, the attending physician, personally viewed and interpreted this ECG. ? ?Sinus rhythm with a rate of 85, normal intervals, normal axis, no ST elevations or depressions ? ?RADIOLOGY ?I, Rudene Re, attending MD, have personally viewed and interpreted the images obtained during this visit as below: ? ?CXR negative for PNA ? ? ?___________________________________________________ ?Interpretation by Radiologist:  ?No results found. ? ? ? ?PROCEDURES: ? ?Critical Care performed: No ? ?Procedures ? ? ? ?IMPRESSION / MDM / ASSESSMENT AND PLAN / ED COURSE  ?I reviewed the triage vital signs and the nursing notes. ? ? 71 y.o. female with a history of diabetes on glipizide and metformin, hypertension, stroke who presents for evaluation of generalized weakness this am. Found per EMS to be hypoglycemic. Patient on oral glycemic agents only, not on insulin.  Did vomit her dinner last night.  She is otherwise well-appearing in no distress with normal vital signs, neuro intact. ? ?Ddx: Hypoglycemia from vomiting/decreased carb intake, sepsis, UTI, dehydration ? ? ?Plan: EKG, CBC, metabolic panel, EKG.  Repeat blood glucose on arrival was 65.  We will start patient on D5 LR bolus of 500 cc.  Will give p.o. orange juice.  Patient placed on telemetry for close monitoring. ? ? ?MEDICATIONS GIVEN IN ED: ?Medications  ?dextrose 5 % in lactated ringers infusion (0 mLs Intravenous Stopped 01/24/22 0626)  ?lactated ringers bolus 1,000 mL (1,000 mLs Intravenous New Bag/Given 01/24/22 0626)  ? ? ? ?ED COURSE: Patient  repeat CBG of 136 while still receiving the bolus.  The plan is to repeat CBG for at least 2 consecutive hours after the bolus is done and as long as patient's blood glucose remained stable she can be cleared for discharge.  No signs of anemia, mildly elevated white count that is nonspecific.  CMP showing mild acute kidney injury with a creatinine of 1.34.  1 L bolus of LR has been given.  Her potassium is elevated at 6 but the blood hemolyzed.  EKG has no hyperkalemic changes.  The plan is to repeat a BMP after bolus to recheck the potassium and creatinine.  Urine is pending.  COVID and flu negative.  EKG and troponin with no signs of ischemia.  Chest x-ray with no signs of pneumonia.  Patient remains on telemetry for close monitoring.  Care transferred to incoming MD at 7 AM ? ? ?Consults: None ? ? ?EMR reviewed including records from patient's last admission to the hospital for syncope in July 2022 ? ? ? ?FINAL CLINICAL IMPRESSION(S) / ED DIAGNOSES  ? ?Final diagnoses:  ?Generalized weakness  ?Hypoglycemia  ?Dehydration  ? ? ? ?Rx / DC Orders  ? ?ED Discharge Orders   ? ? None  ? ?  ? ? ? ?Note:  This document was prepared using Dragon voice recognition software and may include unintentional dictation errors. ? ? ?Please note:  Patient was evaluated in Emergency Department today for the symptoms described in the history of present illness. Patient was evaluated in the context of the global COVID-19 pandemic, which necessitated consideration that the patient might be at risk for infection with the SARS-CoV-2 virus that causes COVID-19. Institutional protocols and algorithms that pertain to the evaluation of patients at risk for COVID-19 are in a state of rapid change based on information released by regulatory bodies including the CDC and federal and state organizations. These policies and algorithms were followed during the patient's care in the ED.  Some ED evaluations and interventions may be delayed as a  result of limited staffing during the pandemic. ? ? ? ? ?  ?Rudene Re, MD ?01/24/22 [phone] ? ?

## 2022-01-24 NOTE — ED Triage Notes (Signed)
EMS brings pt in from home for "tingling" in left knee ?

## 2022-01-24 NOTE — ED Triage Notes (Signed)
Pt to ED via EMS from home c/o left knee pain and tingling,  States started about 1900 tonight, states tingling happens when knee "starts acting up".  Pt was seen earlier today for same knee symptoms.  States takes pill for [facility] and has been taking regularly but only eats when she feels hungry and hasn't had much appetite recently.  CBG 49 in triage, given OJ and sandwich tray. ?

## 2022-01-25 ENCOUNTER — Emergency Department: Payer: Medicare Other

## 2022-01-25 ENCOUNTER — Encounter: Payer: Self-pay | Admitting: Internal Medicine

## 2022-01-25 DIAGNOSIS — R531 Weakness: Secondary | ICD-10-CM

## 2022-01-25 DIAGNOSIS — M25562 Pain in left knee: Secondary | ICD-10-CM

## 2022-01-25 DIAGNOSIS — E11649 Type 2 diabetes mellitus with hypoglycemia without coma: Secondary | ICD-10-CM

## 2022-01-25 DIAGNOSIS — I2510 Atherosclerotic heart disease of native coronary artery without angina pectoris: Secondary | ICD-10-CM

## 2022-01-25 DIAGNOSIS — Z8673 Personal history of transient ischemic attack (TIA), and cerebral infarction without residual deficits: Secondary | ICD-10-CM

## 2022-01-25 LAB — CBG MONITORING, ED
Glucose-Capillary: 201 mg/dL — ABNORMAL HIGH (ref 70–99)
Glucose-Capillary: 172 mg/dL — ABNORMAL HIGH (ref 70–99)
Glucose-Capillary: 54 mg/dL — ABNORMAL LOW (ref 70–99)
Glucose-Capillary: 106 mg/dL — ABNORMAL HIGH (ref 70–99)
Glucose-Capillary: 60 mg/dL — ABNORMAL LOW (ref 70–99)
Glucose-Capillary: 113 mg/dL — ABNORMAL HIGH (ref 70–99)
Glucose-Capillary: 133 mg/dL — ABNORMAL HIGH (ref 70–99)
Glucose-Capillary: 140 mg/dL — ABNORMAL HIGH (ref 70–99)
Glucose-Capillary: 41 mg/dL — CL (ref 70–99)
Glucose-Capillary: 111 mg/dL — ABNORMAL HIGH (ref 70–99)
Glucose-Capillary: 152 mg/dL — ABNORMAL HIGH (ref 70–99)
Glucose-Capillary: 36 mg/dL — CL (ref 70–99)

## 2022-01-25 LAB — GLUCOSE, CAPILLARY
Glucose-Capillary: 149 mg/dL — ABNORMAL HIGH (ref 70–99)
Glucose-Capillary: 63 mg/dL — ABNORMAL LOW (ref 70–99)
Glucose-Capillary: 144 mg/dL — ABNORMAL HIGH (ref 70–99)
Glucose-Capillary: 59 mg/dL — ABNORMAL LOW (ref 70–99)

## 2022-01-25 LAB — BASIC METABOLIC PANEL
Anion gap: 10 (ref 5–15)
BUN: 21 mg/dL (ref 8–23)
CO2: 20 mmol/L — ABNORMAL LOW (ref 22–32)
Calcium: 8.6 mg/dL — ABNORMAL LOW (ref 8.9–10.3)
Chloride: 108 mmol/L (ref 98–111)
Creatinine, Ser: 0.92 mg/dL (ref 0.44–1.00)
GFR, Estimated: >60 mL/min (ref >60)
Glucose, Bld: 100 mg/dL — ABNORMAL HIGH (ref 70–99)
Potassium: 4.7 mmol/L (ref 3.5–5.1)
Sodium: 138 mmol/L (ref 135–145)

## 2022-01-25 LAB — MRSA NEXT GEN BY PCR, NASAL: MRSA by PCR Next Gen: NOT DETECTED

## 2022-01-25 IMAGING — US US EXTREM LOW VENOUS*L*
1 series · 14 of 24 positions shown · non-contrast
Comparison: None.

CLINICAL DATA: Left lower extremity numbness and pain.

EXAM:
Left LOWER EXTREMITY VENOUS DOPPLER ULTRASOUND
TECHNIQUE: Gray-scale sonography with compression, as well as color and duplex
ultrasound, were performed to evaluate the deep venous system(s)
from the level of the common femoral vein through the popliteal and
proximal calf veins.

[Series 1: us venous img lower uni left (dvt) · portal-venous · 14 of 33 slices shown]
[im 1/33]
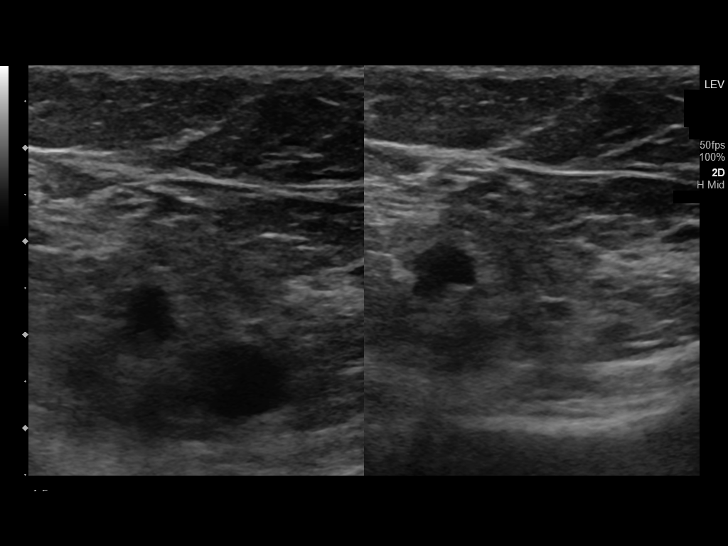
[im 3/33]
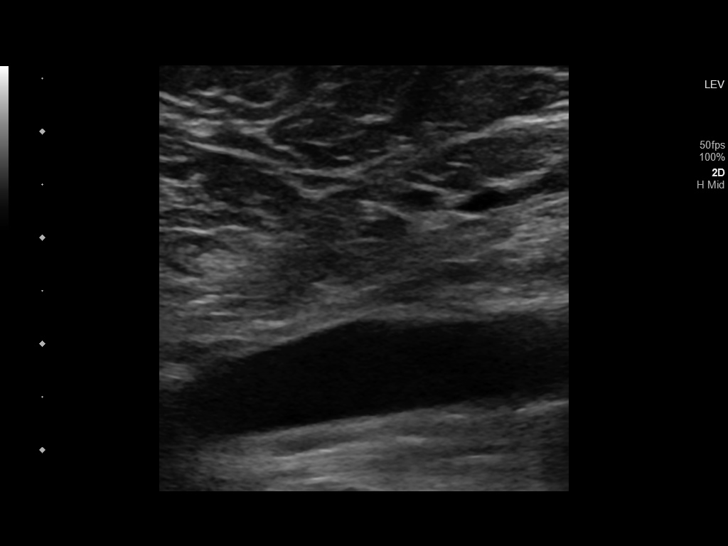
[im 6/33]
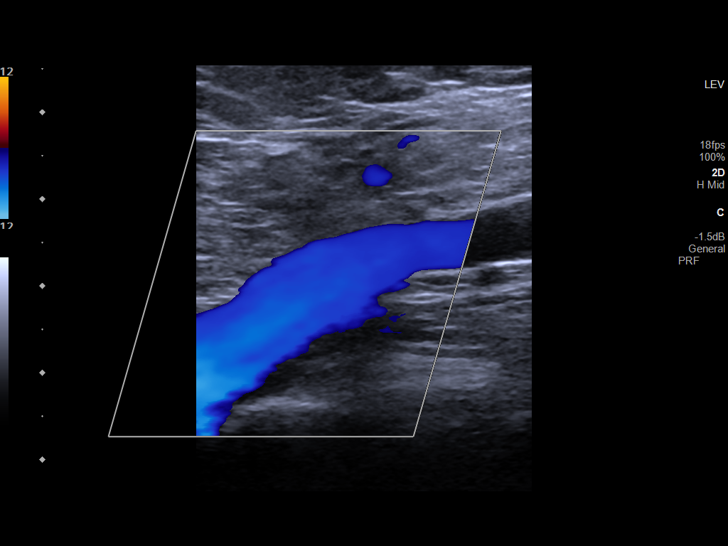
[im 9/33]
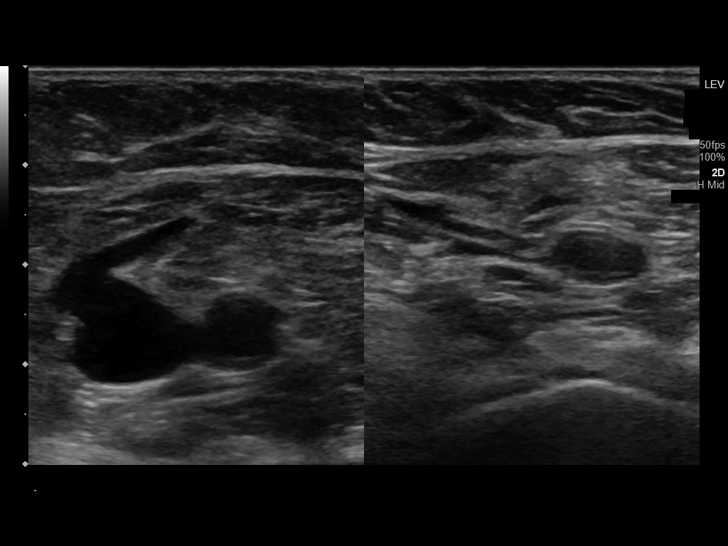
[im 10/33]
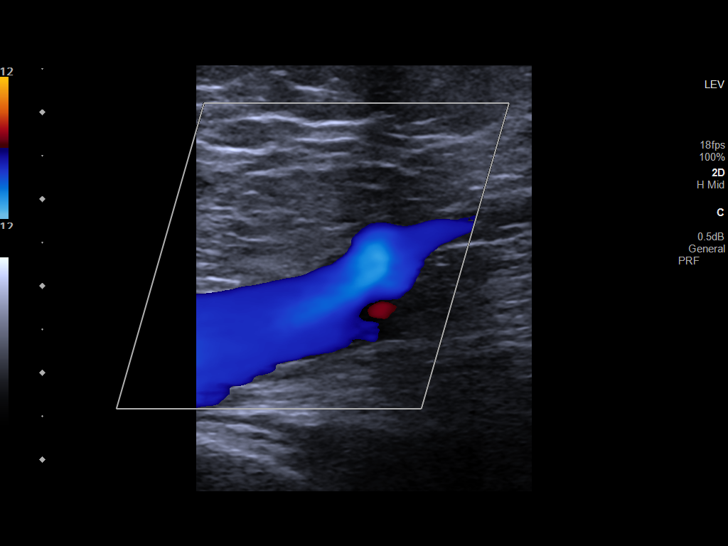
[im 13/33]
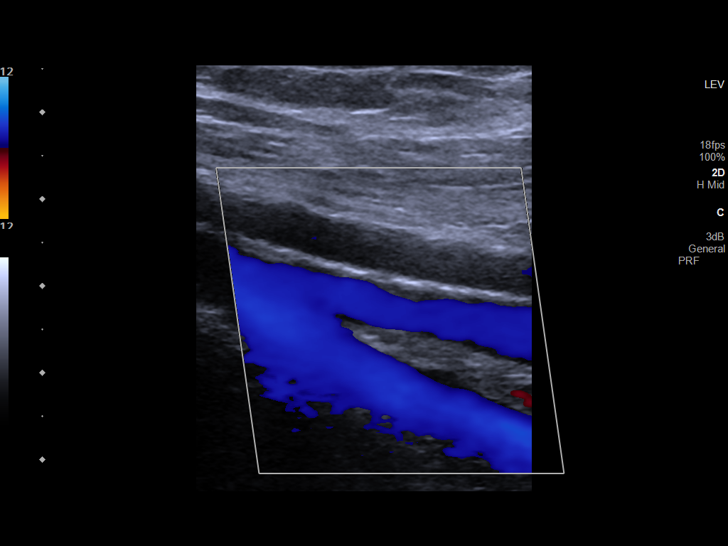
[im 16/33]
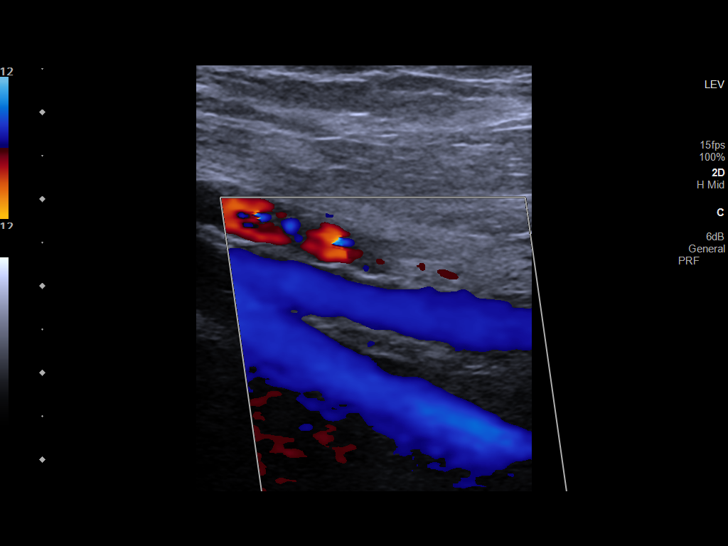
[im 17/33]
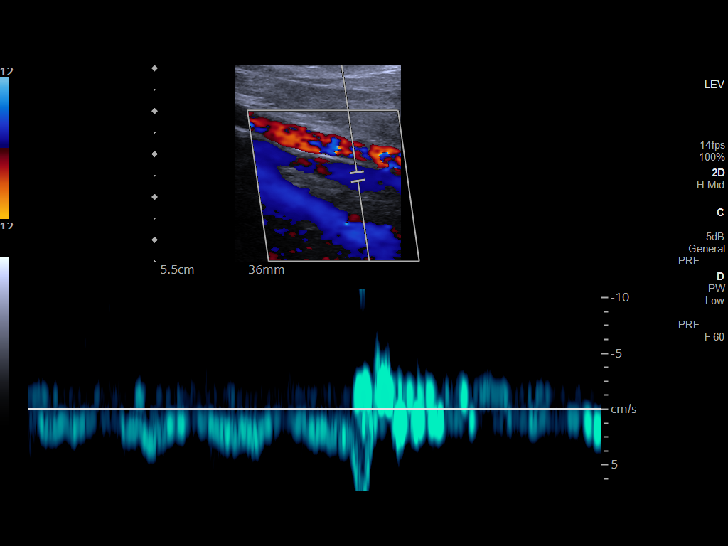
[im 20/33]
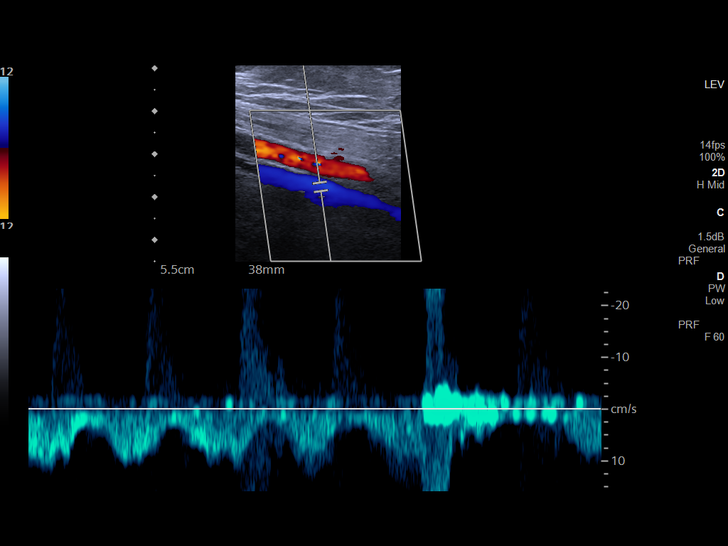
[im 23/33]
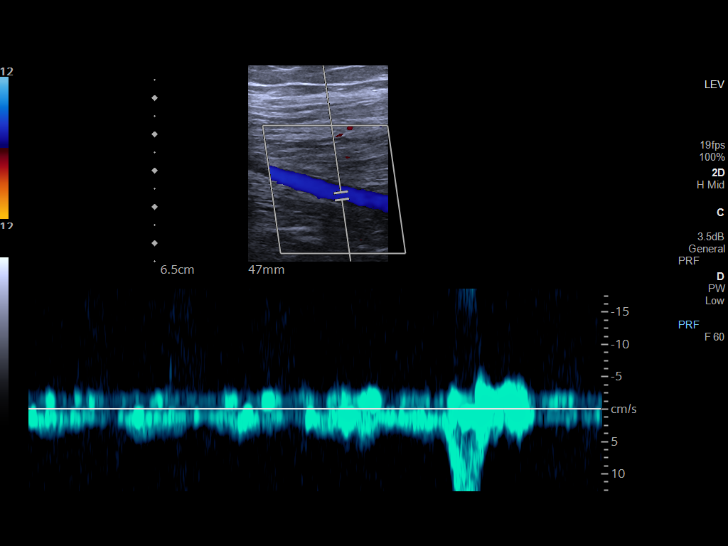
[im 26/33]
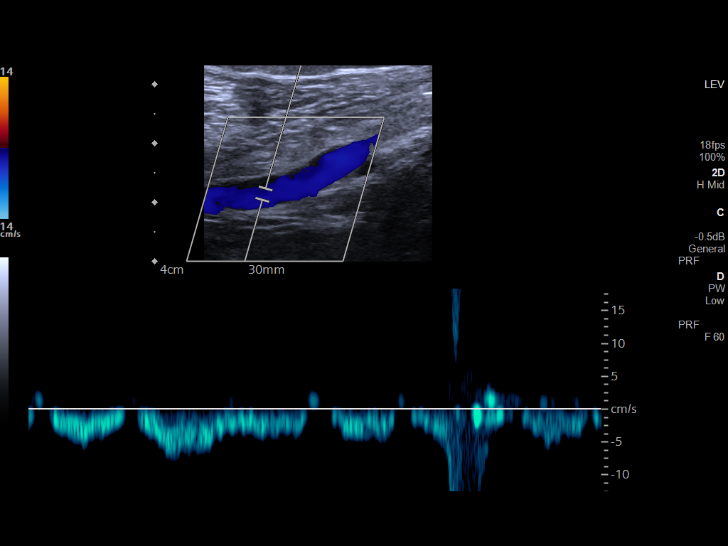
[im 27/33]
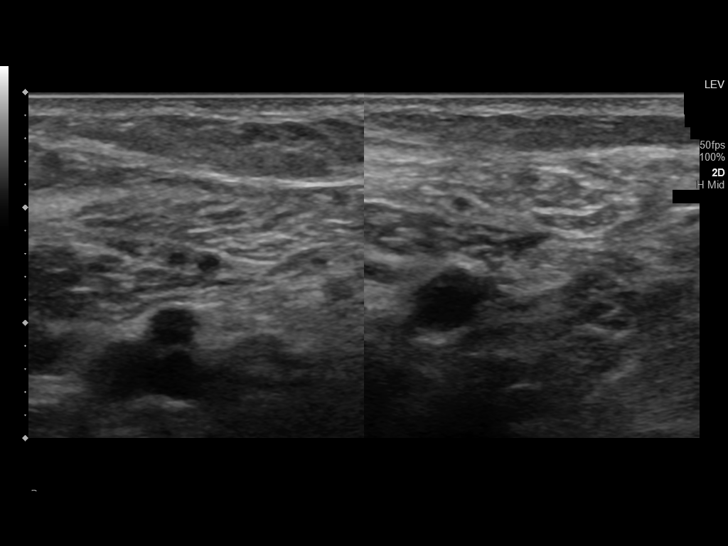
[im 30/33]
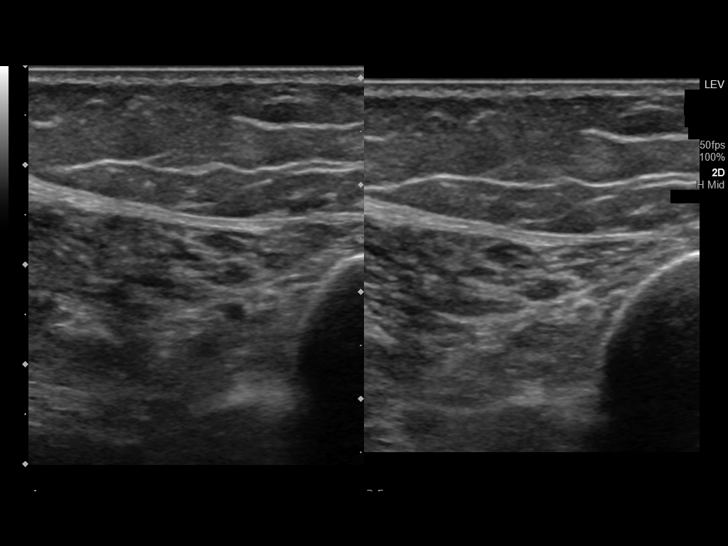
[im 33/33]
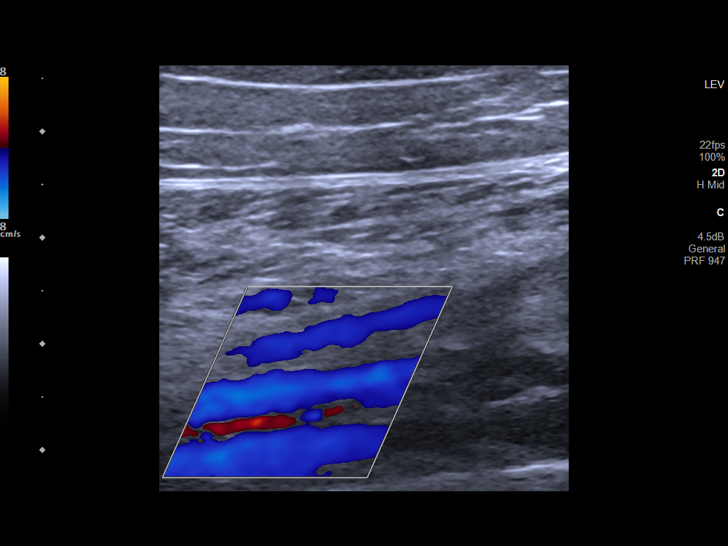

[14 of 24 positions shown; findings below may reference images not displayed]

FINDINGS: VENOUS

Normal compressibility of the common femoral, superficial femoral,
and popliteal veins, as well as the visualized calf veins.
Visualized portions of profunda femoral vein and great saphenous
vein unremarkable. No filling defects to suggest DVT on grayscale or
color Doppler imaging. Doppler waveforms show normal direction of
venous flow, normal respiratory plasticity and response to
augmentation.

Limited views of the contralateral common femoral vein are
unremarkable.

OTHER

None.

Limitations: none
IMPRESSION: Negative.

## 2022-01-25 MED ORDER — DEXTROSE 5 % IV SOLN: INTRAVENOUS | Status: AC

## 2022-01-25 MED ORDER — ATORVASTATIN CALCIUM 20 MG PO TABS
80.0000 mg | ORAL_TABLET | Freq: Every day | ORAL | Status: DC
  Administered 2022-01-25: 80 mg via ORAL
  Filled 2022-01-25: qty 4

## 2022-01-25 MED ORDER — DEXTROSE 50 % IV SOLN
INTRAVENOUS | Status: AC
  Administered 2022-01-25: 50 mL via INTRAVENOUS
  Filled 2022-01-25: qty 50

## 2022-01-25 MED ORDER — CHLORHEXIDINE GLUCONATE CLOTH 2 % EX PADS
6.0000 | MEDICATED_PAD | Freq: Every day | CUTANEOUS | Status: DC
  Administered 2022-01-25: 6 via TOPICAL

## 2022-01-25 MED ORDER — LEVOTHYROXINE SODIUM 112 MCG PO TABS
112.0000 ug | ORAL_TABLET | Freq: Every day | ORAL | Status: DC
  Administered 2022-01-25 – 2022-01-26 (×2): 112 ug via ORAL
  Filled 2022-01-25 (×4): qty 1

## 2022-01-25 MED ORDER — ONDANSETRON HCL 4 MG PO TABS
4.0000 mg | ORAL_TABLET | Freq: Four times a day (QID) | ORAL | Status: DC | PRN
Start: 2022-01-25 — End: 2022-01-26

## 2022-01-25 MED ORDER — ONDANSETRON HCL 4 MG/2ML IJ SOLN
4.0000 mg | Freq: Four times a day (QID) | INTRAMUSCULAR | Status: DC | PRN
Start: 2022-01-25 — End: 2022-01-26

## 2022-01-25 MED ORDER — DEXTROSE 50 % IV SOLN
50.0000 mL | Freq: Once | INTRAVENOUS | Status: AC
  Administered 2022-01-25: 50 mL via INTRAVENOUS
  Filled 2022-01-25: qty 50

## 2022-01-25 MED ORDER — CLOPIDOGREL BISULFATE 75 MG PO TABS
75.0000 mg | ORAL_TABLET | Freq: Every day | ORAL | Status: DC
  Administered 2022-01-25: 75 mg via ORAL
  Filled 2022-01-25: qty 1

## 2022-01-25 MED ORDER — ASPIRIN EC 81 MG PO TBEC
81.0000 mg | DELAYED_RELEASE_TABLET | Freq: Every day | ORAL | Status: DC
  Administered 2022-01-25 – 2022-01-26 (×2): 81 mg via ORAL
  Filled 2022-01-25 (×2): qty 1

## 2022-01-25 MED ORDER — DEXTROSE 50 % IV SOLN: 1.0000 | INTRAVENOUS | Status: AC

## 2022-01-25 MED ORDER — ACETAMINOPHEN 325 MG PO TABS
650.0000 mg | ORAL_TABLET | Freq: Four times a day (QID) | ORAL | Status: DC | PRN
Start: 2022-01-25 — End: 2022-01-26
  Filled 2022-01-25: qty 2

## 2022-01-25 MED ORDER — DEXTROSE 5 % IV SOLN: INTRAVENOUS | Status: DC

## 2022-01-25 MED ORDER — DEXTROSE 50 % IV SOLN
25.0000 mL | INTRAVENOUS | Status: AC
  Administered 2022-01-25: 25 mL via INTRAVENOUS
  Filled 2022-01-25: qty 50

## 2022-01-25 MED ORDER — ENOXAPARIN SODIUM 60 MG/0.6ML IJ SOSY
0.5000 mg/kg | PREFILLED_SYRINGE | INTRAMUSCULAR | Status: DC
  Administered 2022-01-25 – 2022-01-26 (×2): 42.5 mg via SUBCUTANEOUS
  Filled 2022-01-25: qty 0.42
  Filled 2022-01-25: qty 0.6
  Filled 2022-01-25: qty 0.42

## 2022-01-25 MED ORDER — ACETAMINOPHEN 650 MG RE SUPP: 650.0000 mg | Freq: Four times a day (QID) | RECTAL | Status: DC | PRN

## 2022-01-25 MED ORDER — DEXTROSE 10 % IV SOLN: INTRAVENOUS | Status: DC

## 2022-01-25 NOTE — Progress Notes (Addendum)
Inpatient Diabetes Program Recommendations ? ?AACE/ADA: New Consensus Statement on Inpatient Glycemic Control  ? ?Target Ranges:  Prepandial:   less than 140 mg/dL ?     Peak postprandial:   less than 180 mg/dL (1-2 hours) ?     Critically ill patients:  140 - 180 mg/dL  ? ? Latest Reference Range & Units 01/25/22 01:00 01/25/22 01:59 01/25/22 07:16 01/25/22 07:40 01/25/22 08:29  ?Glucose-Capillary 70 - 99 mg/dL 201 (H) 172 (H) 54 (L) 60 (L) 106 (H)  ? ? Latest Reference Range & Units 01/24/22 04:48 01/24/22 06:04 01/24/22 06:59 01/24/22 23:07 01/24/22 23:37  ?Glucose-Capillary 70 - 99 mg/dL 65 (L) 136 (H) 240 (H) 49 (L) 72  ? ? ?Review of Glycemic Control ? ?Diabetes history: DM2 ?Outpatient Diabetes medications: Metformin 1000 mg BID, Glipizide 5 mg BID ?Current orders for Inpatient glycemic control: CBGs; D5@ 100 ml/hr ? ?Inpatient Diabetes Program Recommendations:   ? ?Outpatient DM medications: Would recommend to discontinue Glipizide outpatient due to recurrent hypoglycemia. Could consider discharging on Metformin and Tradjenta 5 mg daily (very low risk of hypoglycemia) and have patient follow up with PCP regarding glycemic control. ? ?Note: Per chart review, patient came to ED on 01/24/22 via EMS for weakness (could not get up from bed so she called 911). EMS noted patient was hypoglycemic. Initial glucose on arrival to ED was 65 mg/dl at 4:48 am on 01/24/22. Patient was treated and discharged home from ED on 01/24/22 around 15:10. Patient presented back to ED on 01/24/22 via EMS at 23:00 with tingling in left knee and CBG noted to be 49 mg/dl at 23:07. Per ED triage note, patient reports she only eats when she feels hungry and hasn't had much appetite recently. Due to recurrent hypoglycemia, would recommend to discontinue Glipizide as outpatient and add a different oral DM medication with low risk of causing hypoglycemia. ? ?Addendum 01/25/22@12 :15-Spoke to patient at bedside. Patient confirms that she is taking  Glipizide 5 mg BID and Metformin 1000 mg BID for DM control. Patient states that she checks glucose once a day in the mornings and it is usually in the low 100's mg/dl. Patient reports that she does not eat much lately, she has no appetite and only eats when she is hungry. Patient has lunch tray at bedside and has eaten very little so far and states she does not want any more to eat right now. Discussed how Glipizide works and increased risk of hypoglycemia especially when not eating. Discussed that she may need to be changed to a different DM medication (other than Glipizide) with less risk of hypoglycemia. Patient states that she goes to [facility] every 3-4 months and that she went last in December 2022. Patient states that her daughter is planning to call today and get her a follow up appointment scheduled at [facility] regarding DM.  Discussed dangers of hypoglycemia and encouraged patient to keep candy or drink on bedside table so she can get to it easily if glucose is low.  Encouraged patient to pay attention to discharge orders to see if any DM medications are changed, to check glucose at least daily, and be sure to follow up with PCP within 1 week.  Patient verbalized understanding of information discussed and states she has no questions at this time.  ? ?Thanks, ?Barnie Alderman, RN, MSN, CDE ?Diabetes Coordinator ?Inpatient Diabetes Program ?[phone] (Team Pager from 8am to 5pm) ? ? ?

## 2022-01-25 NOTE — ED Notes (Signed)
BGL 60, pt given breakfast tray ?

## 2022-01-25 NOTE — Progress Notes (Signed)
Hypoglycemic Event ? ?CBG: 63 ? ?Treatment: 4 oz juice/soda ? ?Symptoms: None ? ?Follow-up CBG: Time:2231 CBG Result:59 ? ?Possible Reasons for Event: Unknown ? ?Comments/MD notified:Brenda Morrison,NP ? ? ?Will administer a half amp of d50, no new orders given at this time. ?Joanne Gavel ? ? ?

## 2022-01-25 NOTE — Assessment & Plan Note (Addendum)
Continue aspirin, Plavix and atorvastatin  

## 2022-01-25 NOTE — Progress Notes (Addendum)
Interval events noted.  She feels better today.  Her left knee pain is better.  She has been able to ambulate to the bathroom. Vital signs are stable.  She will be restarted on dextrose infusion because of recurrent hypoglycemia.  Plan to discharge her home tomorrow if she improves. ?

## 2022-01-25 NOTE — H&P (Signed)
History and Physical    Patient: Madison Davidson:[id number] DOB: October 06, 1951 DOA: 01/24/2022 DOS: the patient was seen and examined on 01/25/2022 PCP: [facility]  Patient coming from: Home  Chief Complaint:  Chief Complaint  Patient presents with   Knee Pain    HPI: Madison Davidson is a 71 y.o. 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  On her first visit she presented for weakness and left knee pain, with EMS reporting a blood sugar in the 30s.  She had no falls.  She did report that she had an episode of vomiting after dinner the night prior and has had decreased oral intake for some time but other than that, denied any complaints.  Her work-up was unremarkable except for mild AKI.  She was treated with D5 and hydrated and discharged.  Tonight, she presents with numbness of the left knee which she described as feeling numb and cold.  She denies numbness or tingling in the arm or face.  Denies trauma to the knee. ED course: By arrival vitals within normal limits Blood work: Glucose 240, creatinine 1.08, improved from 1.34 the day prior but still above baseline of 0.68 in July 2022.  Troponin 15.  While in the ED her blood sugar fell to 49 and she was started on D5 LR. Imaging: Patient had an MRI of the brain on her first visit on 3/1 that showed multiple remote infarcts but no acute intracranial pathology and tonight she had a lower extremity venous Doppler that was negative for DVT. Hospitalist consulted for admission.   Review of Systems: As mentioned in the history of present illness. All other systems reviewed and are negative. Past Medical History:  Diagnosis Date   Cancer ([place])    skin   Coronary artery disease    Diabetes mellitus without complication ([place])    Hypertension     Stroke [facility])    Past Surgical History:  Procedure Laterality Date   ABDOMINAL HYSTERECTOMY     IR ANGIO INTRA EXTRACRAN SEL INTERNAL CAROTID BILAT MOD SED  05/11/2021   IR ANGIO VERTEBRAL SEL VERTEBRAL UNI R MOD SED  05/11/2021   IR ANGIOGRAM EXTREMITY LEFT  05/11/2021   IR CT HEAD LTD  05/11/2021   IR INTRA CRAN STENT  05/11/2021   IR INTRA CRAN STENT  05/12/2021   IR US GUIDE VASC ACCESS RIGHT  05/11/2021   RADIOLOGY WITH ANESTHESIA N/A 05/11/2021   Procedure: IR WITH ANESTHESIA - INTRACRANIAL STENT;  Surgeon: Pedro Earls, MD;  Location: [facility];  Service: Radiology;  Laterality: N/A;   Social History:  reports that she has never smoked. She has never used smokeless tobacco. She reports that she does not drink alcohol and does not use drugs.  No Known Allergies  Family History  Problem Relation Age of Onset   Heart disease Mother    Heart disease Father     Prior to Admission medications   Medication Sig Start Date End Date Taking? Authorizing Provider  acetaminophen (TYLENOL) 325 MG tablet Take 2 tablets (650 mg total) by mouth every 6 (six) hours as needed for mild pain, fever or headache. 05/24/21  Yes Angiulli, Lavon Paganini, PA-C  acidophilus (RISAQUAD) CAPS capsule Take 2 capsules by mouth 3 (three) times daily. 05/24/21  Yes  AngiulliLavon Paganini, PA-C  aspirin EC 81 MG EC tablet Take 1 tablet (81 mg total) by mouth daily. Swallow whole. 05/26/21  Yes Angiulli, Lavon Paganini, PA-C  atorvastatin (LIPITOR) 20 MG tablet Take 4 tablets (80 mg total) by mouth at bedtime. 05/24/21  Yes Angiulli, Lavon Paganini, PA-C  clopidogrel (PLAVIX) 75 MG tablet Take 75 mg by mouth daily. 11/13/21  Yes [provider]  glipiZIDE (GLUCOTROL) 5 MG tablet Take 1 tablet (5 mg total) by mouth 2 (two) times daily before a meal. 05/24/21  Yes Angiulli, Lavon Paganini, PA-C  ketoconazole (NIZORAL) 2 % cream SMARTSIG:1 Application Topical 1 to 2 Times Daily 09/21/21  Yes [provider]  levothyroxine  (SYNTHROID) 112 MCG tablet Take 112 mcg by mouth daily. 10/30/21  Yes [provider]  lisinopril (ZESTRIL) 20 MG tablet Take 1 tablet (20 mg total) by mouth daily. 05/24/21  Yes Angiulli, Lavon Paganini, PA-C  melatonin 1 MG TABS tablet Take 1 mg by mouth at bedtime. 10/12/21  Yes [provider]  melatonin 3 MG TABS tablet Take 3 mg by mouth at bedtime. 06/08/21  Yes [provider]  meloxicam (MOBIC) 15 MG tablet Take 15 mg by mouth daily. 10/31/21  Yes [provider]  metFORMIN (GLUCOPHAGE) 1000 MG tablet Take 1 tablet (1,000 mg total) by mouth 2 (two) times daily with a meal. 05/24/21  Yes Angiulli, Lavon Paganini, PA-C  metFORMIN (GLUCOPHAGE) 500 MG tablet Take 1,000 mg by mouth 2 (two) times daily. 10/30/21  Yes [provider]  mupirocin ointment (BACTROBAN) 2 % SMARTSIG:1 Application Topical 2-3 Times Daily 11/10/21  Yes [provider]  ONGLYZA 5 MG TABS tablet Take 5 mg by mouth daily. 06/08/21  Yes [provider]  polycarbophil (FIBERCON) 625 MG tablet Take 1 tablet (625 mg total) by mouth 2 (two) times daily. 05/24/21  Yes Angiulli, Lavon Paganini, PA-C  vitamin B-12 (CYANOCOBALAMIN) 500 MCG tablet Take 1 tablet (500 mcg total) by mouth daily. 06/14/21  Yes Hosie Poisson, MD  Vitamin D, Ergocalciferol, (DRISDOL) 1.25 MG (50000 UNIT) CAPS capsule Take 50,000 Units by mouth once a week. 07/17/21  Yes [provider]    Physical Exam: Vitals:   01/24/22 2308 01/24/22 2355 01/25/22 0030 01/25/22 0100  BP:  (!) 138/54 137/85 139/86  Pulse:  82 95 94  Resp:  18  19  Temp:      TempSrc:      SpO2:  100%  99%  Weight: 85.7 kg     Height: 5\' 2"  (1.575 m)      Physical Exam Vitals and nursing note reviewed.  Constitutional:      General: She is not in acute distress.    Appearance: Normal appearance.  HENT:     Head: Normocephalic and atraumatic.  Cardiovascular:     Rate and Rhythm: Normal rate and regular rhythm.     Pulses: Normal  pulses.     Heart sounds: Normal heart sounds. No murmur heard. Pulmonary:     Effort: Pulmonary effort is normal.     Breath sounds: Normal breath sounds. No wheezing or rhonchi.  Abdominal:     General: Bowel sounds are normal.     Palpations: Abdomen is soft.     Tenderness: There is no abdominal tenderness.  Musculoskeletal:        General: No swelling or tenderness. Normal range of motion.     Cervical back: Normal range of motion and neck supple.  Skin:  General: Skin is warm and dry.  Neurological:     General: No focal deficit present.     Mental Status: She is alert. Mental status is at baseline.  Psychiatric:        Mood and Affect: Mood normal.        Behavior: Behavior normal.     Data Reviewed: Relevant notes from primary care and specialist visits, past discharge summaries as available in EHR, including Care Everywhere. Prior diagnostic testing as pertinent to current admission diagnoses Updated medications and problem lists for reconciliation ED course, including vitals, labs, imaging, treatment and response to treatment Triage notes, nursing and pharmacy notes and ED provider's notes Notable results as noted in HPI   Assessment and Plan: * Uncontrolled type 2 diabetes mellitus with hypoglycemia, without long-term current use of insulin (HCC) D5 LR infusion with CBG every 2 hours Hold glipizide, metformin and Onglyza Consider discontinuation of glipizide to prevent hypoglycemia in the future  AKI (acute kidney injury) ([place])- (present on admission) Suspect prerenal IV hydration and monitor Creatinine 1.01 with baseline of 0.68 Lab Results  Component Value Date   CREATININE 1.01 (H) 01/24/2022   CREATININE 1.08 (H) 01/24/2022   CREATININE 1.34 (H) 01/24/2022     Generalized weakness Consider PT eval  Left knee pain Patient reports numbness in the knee Doppler negative for DVT Etiology uncertain  History of CVA (cerebrovascular  accident) Continue aspirin, Plavix and atorvastatin  Coronary artery disease No complaints of chest pain Continue aspirin and atorvastatin and lisinopril  Hypertension Controlled.  Continue lisinopril       Advance Care Planning:   Code Status: Prior   Consults: none  Family Communication: none  Severity of Illness: The appropriate patient status for this patient is INPATIENT. Inpatient status is judged to be reasonable and necessary in order to provide the required intensity of service to ensure the patient's safety. The patient's presenting symptoms, physical exam findings, and initial radiographic and laboratory data in the context of their chronic comorbidities is felt to place them at high risk for further clinical deterioration. Furthermore, it is not anticipated that the patient will be medically stable for discharge from the hospital within 2 midnights of admission.   * I certify that at the point of admission it is my clinical judgment that the patient will require inpatient hospital care spanning beyond 2 midnights from the point of admission due to high intensity of service, high risk for further deterioration and high frequency of surveillance required.*  Author: Athena Masse, MD 01/25/2022 2:07 AM  For on call review [email or web address].

## 2022-01-25 NOTE — ED Notes (Signed)
Dr. Mal Misty notified of pt's CBG. Pt is alert, sitting up, and now eating dinner. Awaiting new orders and will continue to monitor.  ?

## 2022-01-25 NOTE — Assessment & Plan Note (Signed)
Consider PT eval ?

## 2022-01-25 NOTE — ED Provider Notes (Signed)
[facility] Provider Note    Event Date/Time   First MD Initiated Contact with Patient 01/24/22 2330     (approximate)   History   Knee Pain   HPI  Madison Davidson is a 71 y.o. female with a history of CAD, diabetes, hypertension, CVA who presents for evaluation of left knee pain.  The patient was seen here yesterday for the same was found to be hypoglycemic.  She reports that this evening she started feeling her left knee was numb and cold.  She reports that these symptoms have been intermittently for the last several weeks.  Patient reports decreased appetite over the last month and has not been eating as much.  Continues to take her glipizide and metformin.  She does report checking her sugars at home in the morning and they are usually elevated greater than 200.  She denies any trauma to the knee.  She denies history of PE or DVT.     Past Medical History:  Diagnosis Date   Cancer ([place])    skin   Coronary artery disease    Diabetes mellitus without complication ([place])    Hypertension    Stroke [facility])     Past Surgical History:  Procedure Laterality Date   ABDOMINAL HYSTERECTOMY     IR ANGIO INTRA EXTRACRAN SEL INTERNAL CAROTID BILAT MOD SED  05/11/2021   IR ANGIO VERTEBRAL SEL VERTEBRAL UNI R MOD SED  05/11/2021   IR ANGIOGRAM EXTREMITY LEFT  05/11/2021   IR CT HEAD LTD  05/11/2021   IR INTRA CRAN STENT  05/11/2021   IR INTRA CRAN STENT  05/12/2021   IR US GUIDE VASC ACCESS RIGHT  05/11/2021   RADIOLOGY WITH ANESTHESIA N/A 05/11/2021   Procedure: IR WITH ANESTHESIA - INTRACRANIAL STENT;  Surgeon: Pedro Earls, MD;  Location: [place];  Service: Radiology;  Laterality: N/A;     Physical Exam   Triage Vital Signs: ED Triage Vitals  Enc Vitals Group     BP 01/24/22 2306 (!) 140/104     Pulse Rate 01/24/22 2306 86     Resp 01/24/22 2306 16     Temp 01/24/22 2306 98.3 F (36.8 C)     Temp Source 01/24/22 2306 Oral     SpO2 01/24/22  2301 98 %     Weight 01/24/22 2308 189 lb (85.7 kg)     Height 01/24/22 2308 5\' 2"  (1.575 m)     Head Circumference --      Peak Flow --      Pain Score 01/24/22 2307 7     Pain Loc --      Pain Edu? --      Excl. in [place]? --     Most recent vital signs: Vitals:   01/24/22 2355 01/25/22 0030  BP: (!) 138/54 137/85  Pulse: 82 95  Resp: 18   Temp:    SpO2: 100%      Constitutional: Alert and oriented. Well appearing and in no apparent distress. HEENT:      Head: Normocephalic and atraumatic.         Eyes: Conjunctivae are normal. Sclera is non-icteric.       Mouth/Throat: Mucous membranes are moist.       Neck: Supple with no signs of meningismus. Cardiovascular: Regular rate and rhythm. No murmurs, gallops, or rubs. 2+ symmetrical distal pulses are present in all extremities.  Respiratory: Normal respiratory effort. Lungs are clear to auscultation  bilaterally.  Gastrointestinal: Soft, non tender, and non distended with positive bowel sounds. No rebound or guarding. Genitourinary: No CVA tenderness. Musculoskeletal: Left lower extremity is warm and well-perfused, she has full painless range of motion of the knee with no swelling, erythema, or any signs of trauma.  PT and DP pulses palpable.  No asymmetric swelling. Neurologic: Normal speech and language. Face is symmetric. Moving all extremities. No gross focal neurologic deficits are appreciated. Skin: Skin is warm, dry and intact. No rash noted. Psychiatric: Mood and affect are normal. Speech and behavior are normal.  ED Results / Procedures / Treatments   Labs (all labs ordered are listed, but only abnormal results are displayed) Labs Reviewed  BASIC METABOLIC PANEL - Abnormal; Notable for the following components:      Result Value   CO2 20 (*)    Glucose, Bld 50 (*)    BUN 24 (*)    Creatinine, Ser 1.01 (*)    GFR, Estimated 59 (*)    All other components within normal limits  CBG MONITORING, ED - Abnormal; Notable  for the following components:   Glucose-Capillary 49 (*)    All other components within normal limits  CBG MONITORING, ED - Abnormal; Notable for the following components:   Glucose-Capillary 201 (*)    All other components within normal limits  CBG MONITORING, ED  CBG MONITORING, ED  CBG MONITORING, ED     EKG  none   RADIOLOGY I, Rudene Re, attending MD, have personally viewed and interpreted the images obtained during this visit as below:  Imaging from yesterday reviewed including MRI and chest x-ray showing no acute findings.  Venous ultrasound negative for DVT   ___________________________________________________ Interpretation by Radiologist:  MR BRAIN WO CONTRAST  Result Date: 01/24/2022 CLINICAL DATA:  Generalized weakness, concern for stroke EXAM: MRI HEAD WITHOUT CONTRAST TECHNIQUE: Multiplanar, multiecho pulse sequences of the brain and surrounding structures were obtained without intravenous contrast. COMPARISON:  MR and CT/CTA head and neck 06/12/2021 FINDINGS: Brain: There is no evidence of acute intracranial hemorrhage, extra-axial fluid collection, or acute infarct. Background parenchymal volume is within normal limits. The ventricles are normal in size. Remote infarcts are seen in the left basal ganglia/caudate body and right corona radiata with foci of SWI signal dropout consistent with chronic blood products, unchanged compared to the prior MRI. Additional small infarcts are seen in the right thalamus and left occipital cortex. There is a minimal burden of white matter microangiopathic change aside from these findings. There is a single punctate chronic microhemorrhage in the right frontal lobe, nonspecific. There is no solid mass lesion. There is no mass effect or midline shift. Vascular: Major intracranial flow voids are present. Susceptibility artifact from a right MCA stent is noted. Skull and upper cervical spine: Normal marrow signal. Sinuses/Orbits: The  paranasal sinuses are clear. The globes and orbits are unremarkable. Other: None. IMPRESSION: 1. No acute intracranial pathology. 2. Multiple remote infarcts as above. Electronically Signed   By: Valetta Mole M.D.   On: 01/24/2022 14:23   US Venous Img Lower Unilateral Left  Result Date: 01/25/2022 CLINICAL DATA:  Left lower extremity numbness and pain. EXAM: Left LOWER EXTREMITY VENOUS DOPPLER ULTRASOUND TECHNIQUE: Gray-scale sonography with compression, as well as color and duplex ultrasound, were performed to evaluate the deep venous system(s) from the level of the common femoral vein through the popliteal and proximal calf veins. COMPARISON:  None. FINDINGS: VENOUS Normal compressibility of the common femoral, superficial femoral, and popliteal  veins, as well as the visualized calf veins. Visualized portions of profunda femoral vein and great saphenous vein unremarkable. No filling defects to suggest DVT on grayscale or color Doppler imaging. Doppler waveforms show normal direction of venous flow, normal respiratory plasticity and response to augmentation. Limited views of the contralateral common femoral vein are unremarkable. OTHER None. Limitations: none IMPRESSION: Negative. Electronically Signed   By: Anner Crete M.D.   On: 01/25/2022 01:04   DG Chest Portable 1 View  Result Date: 01/24/2022 CLINICAL DATA:  Generalized weakness. EXAM: PORTABLE CHEST 1 VIEW COMPARISON:  05/12/2021 FINDINGS: 0532 hours. The lungs are clear without focal pneumonia, edema, pneumothorax or pleural effusion. The cardiopericardial silhouette is within normal limits for size. The visualized bony structures of the thorax are unremarkable. IMPRESSION: No active disease. Electronically Signed   By: Misty Stanley M.D.   On: 01/24/2022 06:54      PROCEDURES:  Critical Care performed: No  Procedures    IMPRESSION / MDM / ASSESSMENT AND PLAN / ED COURSE  I reviewed the triage vital signs and the nursing notes.    71 y.o. female with a history of CAD, diabetes, hypertension, CVA who presents for evaluation of left knee pain.  Patient presents with several weeks of intermittent cold and numbness of the left knee.  On exam left lower extremity is normal with no swelling, erythema, warmth, full painless range of motion of all joints, strong distal pulses with brisk capillary refill  Ddx: DVT versus arthritis versus sequela of a prior stroke versus hypoglycemia   MEDICATIONS GIVEN IN ED: Medications  dextrose 5 % in lactated ringers infusion (0 mLs Intravenous Stopped 01/25/22 0056)     ED COURSE: Patient seen 24 hours ago for similar complaints and found to be hypoglycemic.  She tells me that over the last month she has been eating a lot last just because she does not feel hungry.  She reports checking her sugars at home every morning and they are usually above 200.  She is on glipizide and metformin.  Ultrasound venous Doppler of the left lower extremity was done and showed no signs of DVT.  On exam there is no signs of ischemia, cellulitis, septic joint, trauma.  She did have an MRI done this morning which showed old strokes.  Her symptoms could be reactivation of old stroke sites in the setting of hypoglycemia.  Since this is patient's second visit to the ED for hypoglycemia will consult the hospitalist for admission.  Patient was given a D5 LR 500 cc bolus, orange juice and peanut butter with crackers.  We will keep checking her sugars every hour.  We will hold her antiglycemic agents.  After discussion with the hospitalist patient was accepted to their service.   Consults: Hospitalist   EMR reviewed including records from her last admission to the hospital from July 2022 for syncope    FINAL CLINICAL IMPRESSION(S) / ED DIAGNOSES   Final diagnoses:  Hypoglycemia     Rx / DC Orders   ED Discharge Orders     None        Note:  This document was prepared using Dragon voice recognition  software and may include unintentional dictation errors.   Please note:  Patient was evaluated in Emergency Department today for the symptoms described in the history of present illness. Patient was evaluated in the context of the global COVID-19 pandemic, which necessitated consideration that the patient might be at risk for infection with the  SARS-CoV-2 virus that causes COVID-19. Institutional protocols and algorithms that pertain to the evaluation of patients at risk for COVID-19 are in a state of rapid change based on information released by regulatory bodies including the CDC and federal and state organizations. These policies and algorithms were followed during the patient's care in the ED.  Some ED evaluations and interventions may be delayed as a result of limited staffing during the pandemic.       Alfred Levins, [place], MD 01/25/22 [phone]

## 2022-01-25 NOTE — Assessment & Plan Note (Signed)
No complaints of chest pain ?Continue aspirin and atorvastatin and lisinopril ?

## 2022-01-25 NOTE — ED Notes (Signed)
Pt given 2 cups of apple juice, graham crackers, and peanut butter at this time.  ?

## 2022-01-25 NOTE — ED Notes (Signed)
Pt's CBG as noted. Assessed IV site and noted area was edematous and painful to flush. IVF stopped and IV removed. New access obtained and Sharion Settler, NP notified. Awaiting new orders. This RN will continue to monitor.  ?

## 2022-01-25 NOTE — Assessment & Plan Note (Signed)
Controlled.  Continue lisinopril 

## 2022-01-25 NOTE — Plan of Care (Signed)
?  Problem: Education: ?Goal: Knowledge of General Education information will improve ?Description: Including pain rating scale, medication(s)/side effects and non-pharmacologic comfort measures ?Outcome: Progressing ?Note: Patient profile completed. No complaints of pain, some numbness in the left knee. Abrasion on the right buttocks.  ?  ?

## 2022-01-25 NOTE — Assessment & Plan Note (Signed)
Patient reports numbness in the knee ?Doppler negative for DVT ?Etiology uncertain ?

## 2022-01-25 NOTE — Progress Notes (Signed)
Anticoagulation monitoring(Lovenox): ? ?71 yo female ordered Lovenox 40 mg Q24h ?   ?Filed Weights  ? 01/24/22 2308  ?Weight: 85.7 kg (189 lb)  ? ?BMI 34.57   ? ?Lab Results  ?Component Value Date  ? CREATININE 1.01 (H) 01/24/2022  ? CREATININE 1.08 (H) 01/24/2022  ? CREATININE 1.34 (H) 01/24/2022  ? ?Estimated Creatinine Clearance: 51.1 mL/min (A) (by C-G formula based on SCr of 1.01 mg/dL (H)). ?Hemoglobin & Hematocrit  ?   ?Component Value Date/Time  ? HGB 12.5 01/24/2022 0501  ? HCT 39.1 01/24/2022 0501  ? ? ? ?Per Protocol for Patient with estCrcl > 30 ml/min and BMI > 30, will transition to Lovenox 42.5 mg Q24h.  ?  ? ? ?

## 2022-01-25 NOTE — Assessment & Plan Note (Signed)
Suspect prerenal ?IV hydration and monitor ?Creatinine 1.01 with baseline of 0.68 ?Lab Results  ?Component Value Date  ? CREATININE 1.01 (H) 01/24/2022  ? CREATININE 1.08 (H) 01/24/2022  ? CREATININE 1.34 (H) 01/24/2022  ? ? ?

## 2022-01-25 NOTE — Assessment & Plan Note (Signed)
D5 LR infusion with CBG every 2 hours ?Hold glipizide, metformin and Onglyza ?Consider discontinuation of glipizide to prevent hypoglycemia in the future ?

## 2022-01-26 DIAGNOSIS — E162 Hypoglycemia, unspecified: Secondary | ICD-10-CM

## 2022-01-26 DIAGNOSIS — R531 Weakness: Secondary | ICD-10-CM | POA: Diagnosis not present

## 2022-01-26 DIAGNOSIS — N179 Acute kidney failure, unspecified: Secondary | ICD-10-CM | POA: Diagnosis not present

## 2022-01-26 DIAGNOSIS — E11649 Type 2 diabetes mellitus with hypoglycemia without coma: Secondary | ICD-10-CM | POA: Diagnosis not present

## 2022-01-26 LAB — BASIC METABOLIC PANEL
Anion gap: 6 (ref 5–15)
BUN: 10 mg/dL (ref 8–23)
CO2: 25 mmol/L (ref 22–32)
Calcium: 8.1 mg/dL — ABNORMAL LOW (ref 8.9–10.3)
Chloride: 104 mmol/L (ref 98–111)
Creatinine, Ser: 0.75 mg/dL (ref 0.44–1.00)
GFR, Estimated: >60 mL/min (ref >60)
Glucose, Bld: 183 mg/dL — ABNORMAL HIGH (ref 70–99)
Potassium: 4.4 mmol/L (ref 3.5–5.1)
Sodium: 135 mmol/L (ref 135–145)

## 2022-01-26 LAB — GLUCOSE, CAPILLARY
Glucose-Capillary: 71 mg/dL (ref 70–99)
Glucose-Capillary: 72 mg/dL (ref 70–99)
Glucose-Capillary: 101 mg/dL — ABNORMAL HIGH (ref 70–99)
Glucose-Capillary: 135 mg/dL — ABNORMAL HIGH (ref 70–99)
Glucose-Capillary: 161 mg/dL — ABNORMAL HIGH (ref 70–99)
Glucose-Capillary: 190 mg/dL — ABNORMAL HIGH (ref 70–99)
Glucose-Capillary: 152 mg/dL — ABNORMAL HIGH (ref 70–99)
Glucose-Capillary: 142 mg/dL — ABNORMAL HIGH (ref 70–99)

## 2022-01-26 MED ORDER — VITAMIN D (ERGOCALCIFEROL) 1.25 MG (50000 UNIT) PO CAPS: 50000.0000 [IU] | ORAL_CAPSULE | ORAL | Status: DC

## 2022-01-26 MED ORDER — LISINOPRIL 20 MG PO TABS
20.0000 mg | ORAL_TABLET | Freq: Every day | ORAL | Status: DC
  Administered 2022-01-26: 20 mg via ORAL
  Filled 2022-01-26: qty 1

## 2022-01-26 MED ORDER — ADULT MULTIVITAMIN W/MINERALS CH: 1.0000 | ORAL_TABLET | Freq: Every day | ORAL | Status: DC

## 2022-01-26 MED ORDER — GLUCERNA SHAKE PO LIQD
237.0000 mL | Freq: Three times a day (TID) | ORAL | Status: DC
  Administered 2022-01-26: 237 mL via ORAL

## 2022-01-26 MED ORDER — MELOXICAM 7.5 MG PO TABS
15.0000 mg | ORAL_TABLET | Freq: Every day | ORAL | Status: DC
  Administered 2022-01-26: 15 mg via ORAL
  Filled 2022-01-26: qty 2

## 2022-01-26 MED ORDER — MELATONIN 5 MG PO TABS: 2.5000 mg | ORAL_TABLET | Freq: Every day | ORAL | Status: DC

## 2022-01-26 NOTE — Progress Notes (Signed)
?   01/26/22 0800  ?Clinical Encounter Type  ?Visited With Patient  ?Visit Type Initial;Spiritual support  ?Referral From Nurse  ?Spiritual Encounters  ?Spiritual Needs Prayer  ? ?Chaplain responded to spiritual consult for prayer.  ?

## 2022-01-26 NOTE — Progress Notes (Signed)
Initial Nutrition Assessment ? ?DOCUMENTATION CODES:  ? ?Not applicable ? ?INTERVENTION:  ? ?Glucerna Shake po TID, each supplement provides 220 kcal and 10 grams of protein ? ?MVI po daily  ? ?Pt at high refeed risk; recommend monitor potassium, magnesium and phosphorus labs daily until stable ? ?NUTRITION DIAGNOSIS:  ? ?Inadequate oral intake related to acute illness as evidenced by per patient/family report. ? ?GOAL:  ? ?Patient will meet greater than or equal to 90% of their needs ? ?MONITOR:  ? ?PO intake, Supplement acceptance, Labs, Skin, I & O's ? ?REASON FOR ASSESSMENT:  ? ?Consult ?Assessment of nutrition requirement/status ? ?ASSESSMENT:  ? ?71 y.o. 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 ? ?Met with pt in room today. Pt reports poor appetite and oral intake for two months pta. Pt has not been drinking any supplements at home. Pt is unsure if she has had any weight loss. Per chart, pt is down 21lbs(12%) since July 2022; this is significant weight loss. Pt reports eating ~50% of her breakfast this morning; pt is documented to have only eaten sips and bites. RD discussed with pt the importance of adequate nutrition needed to preserve lean muscle. Pt is willing to drink chocolate supplements in hospital. RD will add supplements and MVI to help pt meet her estimated needs. Pt is at high refeed risk.  ? ?Medications reviewed and include: aspirin, lovenox, synthroid, melatonin, vitamin D ? ?Labs reviewed: K 4.4 wnl ?Wbc- 11.5(H) ?Cbgs- 190, 161, 135, 101, 72, 71 x 24 hrs ?AIC 7.1(H)- 04/2021 ? ?NUTRITION - FOCUSED PHYSICAL EXAM: ? ?Flowsheet Row Most Recent Value  ?Orbital Region No depletion  ?Upper Arm Region Moderate depletion  ?Thoracic and Lumbar Region No depletion  ?Buccal Region No depletion  ?Temple Region Mild depletion  ?Clavicle  Bone Region Moderate depletion  ?Clavicle and Acromion Bone Region Moderate depletion  ?Scapular Bone Region No depletion  ?Dorsal Hand No depletion  ?Patellar Region Moderate depletion  ?Anterior Thigh Region Mild depletion  ?Posterior Calf Region Mild depletion  ?Edema (RD Assessment) None  ?Hair Reviewed  ?Eyes Reviewed  ?Mouth Reviewed  ?Skin Reviewed  ?Nails Reviewed  ? ?Diet Order:   ?Diet Order   ? ?       ?  Diet heart healthy/carb modified Room service appropriate? Yes; Fluid consistency: Thin  Diet effective now       ?  ? ?  ?  ? ?  ? ?EDUCATION NEEDS:  ? ?Education needs have been addressed ? ?Skin:  Skin Assessment: Reviewed RN Assessment ? ?Last BM:  3/3- type 4 ? ?Height:  ? ?Ht Readings from Last 1 Encounters:  ?01/25/22 _0  (1.575 m)  ? ? ?Weight:  ? ?Wt Readings from Last 1 Encounters:  ?01/25/22 67.5 kg  ? ? ?Ideal Body Weight:  50 kg ? ?BMI:  Body mass index is 27.22 kg/m?. ? ?Estimated Nutritional Needs:  ? ?Kcal:  1500-1700kcal/day ? ?Protein:  75-85g/day ? ?Fluid:  1.3-1.5L/day ? ?Koleen Distance MS, RD, LDN ?Please refer to [facility] for RD and/or RD on-call/weekend/after hours pager ? ?

## 2022-01-26 NOTE — Discharge Summary (Signed)
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  Diet recommendation:  Diet Order             Diet - low sodium heart healthy           Diet Carb Modified           Diet heart healthy/carb modified Room service appropriate? Yes; Fluid consistency: Thin  Diet effective now                     Code Status: Full Code     Hospital Course:   Ms. Madison Davidson  is a 71 y.o. 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  She was admitted to the hospital for AKI, recurrent hypoglycemia and left knee pain.  She was treated with IV dextrose infusion.  AKI and hyperglycemia has improved.  Left knee pain has improved as well.  There was no evidence of acute stroke on MRI brain-no evidence of DVT in the left lower extremity.  She was taking metformin 1000 mg twice daily, glipizide 5 mg twice daily and Onglyza 5 mg daily prior to admission.  Glipizide and Onglyza have been discontinued because of recurrent hypoglycemia.  She  will continue with metformin for now.  Follow-up with PCP is recommended for further instructions regarding hypoglycemic agents.  Hemoglobin A1c was pending at the time of discharge.  Her previous A1c was 7.1 in June 2022.  Her condition has improved.  Left knee pain is better.  She was able to ambulate in the hallway without any problems.  She is deemed stable for discharge to home today.  Discharge plan was discussed with patient and her daughter, Madison Davidson, at the bedside.     Discharge Exam:    Vitals:   01/26/22 0800 01/26/22 0815 01/26/22 0900 01/26/22 1235  BP: (!) 157/61  (!) 113/93 99/84  Pulse: 79 91 62 72  Resp: 18 18 16 16   Temp:  97.6 F (36.4 C)  98.1 F (36.7 C)  TempSrc:  Oral  Oral  SpO2: 99%  100% 97%  Weight:      Height:         GEN: NAD SKIN: No rash EYES: EOMI ENT: MMM CV: RRR PULM: CTA B ABD: soft, ND, NT, +BS CNS: AAO x 3, non focal EXT: No edema or tenderness   The results of significant diagnostics from this hospitalization (including imaging, microbiology, ancillary and laboratory) are listed below for reference.     Procedures and Diagnostic Studies:   MR BRAIN WO CONTRAST  Result Date: 01/24/2022 CLINICAL DATA:  Generalized weakness, concern for stroke EXAM: MRI HEAD WITHOUT CONTRAST TECHNIQUE: Multiplanar, multiecho pulse sequences of the brain and surrounding structures were obtained without intravenous contrast. COMPARISON:  MR and CT/CTA head and neck 06/12/2021 FINDINGS: Brain: There is no evidence of acute intracranial hemorrhage, extra-axial fluid collection, or acute infarct. Background parenchymal volume is within normal limits. The ventricles are normal in size. Remote infarcts are seen in the left basal ganglia/caudate body and right corona radiata with foci of SWI signal dropout consistent with chronic blood products, unchanged compared to the prior MRI. Additional small infarcts are seen in the right thalamus and left occipital cortex.  There is a minimal burden of white matter microangiopathic change aside from these findings. There is a single punctate chronic microhemorrhage in the right frontal lobe, nonspecific. There is no solid mass lesion. There is no mass effect or midline shift. Vascular: Major intracranial flow voids are present. Susceptibility artifact from a right MCA stent is noted. Skull and upper cervical spine: Normal marrow signal. Sinuses/Orbits: The paranasal sinuses are clear. The globes and orbits are unremarkable. Other: None. IMPRESSION: 1. No acute intracranial pathology. 2. Multiple remote infarcts as above. Electronically Signed   By: Valetta Mole M.D.   On: 01/24/2022 14:23   US Venous Img Lower Unilateral Left  Result Date: 01/25/2022 CLINICAL DATA:  Left lower extremity numbness and pain. EXAM: Left LOWER EXTREMITY VENOUS DOPPLER ULTRASOUND TECHNIQUE: Gray-scale sonography with compression, as well as color and duplex ultrasound, were performed to evaluate the deep venous system(s) from the level of the common femoral vein through the popliteal and proximal calf veins. COMPARISON:  None. FINDINGS: VENOUS Normal compressibility of the common femoral, superficial femoral, and popliteal veins, as well as the visualized calf veins. Visualized portions of profunda femoral vein and great saphenous vein unremarkable. No filling defects to suggest DVT on grayscale or color Doppler imaging. Doppler waveforms show normal direction of venous flow, normal respiratory plasticity and response to augmentation. Limited views of the contralateral common femoral vein are unremarkable. OTHER None. Limitations: none IMPRESSION: Negative. Electronically Signed   By: Anner Crete M.D.   On: 01/25/2022 01:04   DG Chest Portable 1 View  Result Date: 01/24/2022 CLINICAL DATA:  Generalized weakness. EXAM: PORTABLE CHEST 1 VIEW COMPARISON:  05/12/2021 FINDINGS: 0532 hours. The lungs are clear without focal pneumonia, edema,  pneumothorax or pleural effusion. The cardiopericardial silhouette is within normal limits for size. The visualized bony structures of the thorax are unremarkable. IMPRESSION: No active disease. Electronically Signed   By: Misty Stanley M.D.   On: 01/24/2022 06:54     Labs:   Basic Metabolic Panel: Recent Labs  Lab 01/24/22 0501 01/24/22 0757 01/24/22 2317 01/25/22 0525 01/26/22 0929  NA 139 137 138 138 135  K 6.0* 4.3 4.5 4.7 4.4  CL 107 106 107 108 104  CO2 24 23 20* 20* 25  GLUCOSE 98 172* 50* 100* 183*  BUN 38* 33* 24* 21 10  CREATININE 1.34* 1.08* 1.01* 0.92 0.75  CALCIUM 9.1 8.4* 9.1 8.6* 8.1*   GFR Estimated Creatinine Clearance: 57.3 mL/min (by C-G formula based on SCr of 0.75 mg/dL). Liver Function Tests: Recent Labs  Lab 01/24/22 0501  AST 25  ALT 14  ALKPHOS 73  BILITOT 1.2  PROT 7.5  ALBUMIN 3.9   No results for input(s): LIPASE, AMYLASE in the last 168 hours. No results for input(s): AMMONIA in the last 168 hours. Coagulation profile No results for input(s): INR, PROTIME in  the last 168 hours.  CBC: Recent Labs  Lab 01/24/22 0501  WBC 11.5*  NEUTROABS 8.1*  HGB 12.5  HCT 39.1  MCV 83.0  PLT 334   Cardiac Enzymes: No results for input(s): CKTOTAL, CKMB, CKMBINDEX, TROPONINI in the last 168 hours. BNP: Invalid input(s): POCBNP CBG: Recent Labs  Lab 01/26/22 0602 01/26/22 0736 01/26/22 1023 01/26/22 1207 01/26/22 1413  GLUCAP 135* 161* 190* 152* 142*   D-Dimer No results for input(s): DDIMER in the last 72 hours. Hgb A1c No results for input(s): HGBA1C in the last 72 hours. Lipid Profile No results for input(s): CHOL, HDL, LDLCALC, TRIG, CHOLHDL, LDLDIRECT in the last 72 hours. Thyroid function studies No results for input(s): TSH, T4TOTAL, T3FREE, THYROIDAB in the last 72 hours.  Invalid input(s): FREET3 Anemia work up No results for input(s): VITAMINB12, FOLATE, FERRITIN, TIBC, IRON, RETICCTPCT in the last 72  hours. Microbiology Recent Results (from the past 240 hour(s))  Resp Panel by RT-PCR (Flu A&B, Covid) Nasopharyngeal Swab     Status: None   Collection Time: 01/24/22  5:01 AM   Specimen: Nasopharyngeal Swab; Nasopharyngeal(NP) swabs in vial transport medium  Result Value Ref Range Status   SARS Coronavirus 2 by RT PCR NEGATIVE NEGATIVE Final    Comment: (NOTE) SARS-CoV-2 target nucleic acids are NOT DETECTED.  The SARS-CoV-2 RNA is generally detectable in upper respiratory specimens during the acute phase of infection. The lowest concentration of SARS-CoV-2 viral copies this assay can detect is 138 copies/mL. A negative result does not preclude SARS-Cov-2 infection and should not be used as the sole basis for treatment or other patient management decisions. A negative result may occur with  improper specimen collection/handling, submission of specimen other than nasopharyngeal swab, presence of viral mutation(s) within the areas targeted by this assay, and inadequate number of viral copies(<138 copies/mL). A negative result must be combined with clinical observations, patient history, and epidemiological information. The expected result is Negative.  Fact Sheet for Patients:  EntrepreneurPulse.com.au  Fact Sheet for Healthcare Providers:  IncredibleEmployment.be  This test is no t yet approved or cleared by the [place] FDA and  has been authorized for detection and/or diagnosis of SARS-CoV-2 by FDA under an Emergency Use Authorization (EUA). This EUA will remain  in effect (meaning this test can be used) for the duration of the COVID-19 declaration under Section 564(b)(1) of the Act, 21 U.S.C.section 360bbb-3(b)(1), unless the authorization is terminated  or revoked sooner.       Influenza A by PCR NEGATIVE NEGATIVE Final   Influenza B by PCR NEGATIVE NEGATIVE Final    Comment: (NOTE) The Xpert Xpress SARS-CoV-2/FLU/RSV plus assay  is intended as an aid in the diagnosis of influenza from Nasopharyngeal swab specimens and should not be used as a sole basis for treatment. Nasal washings and aspirates are unacceptable for Xpert Xpress SARS-CoV-2/FLU/RSV testing.  Fact Sheet for Patients: EntrepreneurPulse.com.au  Fact Sheet for Healthcare Providers: IncredibleEmployment.be  This test is not yet approved or cleared by the [place] FDA and has been authorized for detection and/or diagnosis of SARS-CoV-2 by FDA under an Emergency Use Authorization (EUA). This EUA will remain in effect (meaning this test can be used) for the duration of the COVID-19 declaration under Section 564(b)(1) of the Act, 21 U.S.C. section 360bbb-3(b)(1), unless the authorization is terminated or revoked.  Performed at [facility], [address], [place], [place] [address]   MRSA Next Gen by PCR, Nasal     Status: None  Collection Time: 01/25/22  8:31 PM   Specimen: Nasal Mucosa; Nasal Swab  Result Value Ref Range Status   MRSA by PCR Next Gen NOT DETECTED NOT DETECTED Final    Comment: (NOTE) The GeneXpert MRSA Assay (FDA approved for NASAL specimens only), is one component of a comprehensive MRSA colonization surveillance program. It is not intended to diagnose MRSA infection nor to guide or monitor treatment for MRSA infections. Test performance is not FDA approved in patients less than 75 years old. Performed at [facility], [address], [place], [place] [address]      Discharge Instructions:   Discharge Instructions     Diet - low sodium heart healthy   Complete by: As directed    Diet Carb Modified   Complete by: As directed    Face-to-face encounter (required for Medicare/Medicaid patients)   Complete by: As directed    I [place] certify that this patient is under my care and that I, or a nurse practitioner or physician's assistant working with me,  had a face-to-face encounter that meets the physician face-to-face encounter requirements with this patient on 01/26/2022. The encounter with the patient was in whole, or in part for the following medical condition(s) which is the primary reason for home health care (List medical condition): Diabetes mellitus with hypoglycemia   The encounter with the patient was in whole, or in part, for the following medical condition, which is the primary reason for home health care: Diabetes mellitus with hypoglycemia   I certify that, based on my findings, the following services are medically necessary home health services: Nursing   Reason for Medically Necessary Home Health Services: Skilled Nursing- Changes in Medication/Medication Management   My clinical findings support the need for the above services: OTHER SEE COMMENTS   Further, I certify that my clinical findings support that this patient is homebound due to: Immunocompromised   Home Health   Complete by: As directed    To provide the following care/treatments:  RN Social work     Increase activity slowly   Complete by: As directed       Allergies as of 01/26/2022   No Known Allergies      Medication List     STOP taking these medications    meloxicam 15 MG tablet Commonly known as: MOBIC   Onglyza 5 MG Tabs tablet Generic drug: saxagliptin HCl       TAKE these medications    acetaminophen 325 MG tablet Commonly known as: TYLENOL Take 2 tablets (650 mg total) by mouth every 6 (six) hours as needed for mild pain, fever or headache.   acidophilus Caps capsule Take 2 capsules by mouth 3 (three) times daily.   aspirin 81 MG EC tablet Take 1 tablet (81 mg total) by mouth daily. Swallow whole.   atorvastatin 20 MG tablet Commonly known as: LIPITOR Take 4 tablets (80 mg total) by mouth at bedtime.   clopidogrel 75 MG tablet Commonly known as: PLAVIX Take 75 mg by mouth daily.   ketoconazole 2 % cream Commonly known as:  NIZORAL SMARTSIG:1 Application Topical 1 to 2 Times Daily   levothyroxine 112 MCG tablet Commonly known as: SYNTHROID Take 112 mcg by mouth daily.   lisinopril 20 MG tablet Commonly known as: ZESTRIL Take 1 tablet (20 mg total) by mouth daily.   melatonin 3 MG Tabs tablet Take 3 mg by mouth at bedtime. What changed: Another medication with the same name was removed. Continue  taking this medication, and follow the directions you see here.   metFORMIN 1000 MG tablet Commonly known as: GLUCOPHAGE Take 1 tablet (1,000 mg total) by mouth 2 (two) times daily with a meal. Notes to patient: ENSURE WITH MEAL   mupirocin ointment 2 % Commonly known as: BACTROBAN SMARTSIG:1 Application Topical 2-3 Times Daily   polycarbophil 625 MG tablet Commonly known as: FIBERCON Take 1 tablet (625 mg total) by mouth 2 (two) times daily.   vitamin B-12 500 MCG tablet Commonly known as: CYANOCOBALAMIN Take 1 tablet (500 mcg total) by mouth daily.   Vitamin D (Ergocalciferol) 1.25 MG (50000 UNIT) Caps capsule Commonly known as: DRISDOL Take 50,000 Units by mouth once a week. Notes to patient: Once a week           If you experience worsening of your admission symptoms, develop shortness of breath, life threatening emergency, suicidal or homicidal thoughts you must seek medical attention immediately by calling 911 or calling your MD immediately  if symptoms less severe.   You must read complete instructions/literature along with all the possible adverse reactions/side effects for all the medicines you take and that have been prescribed to you. Take any new medicines after you have completely understood and accept all the possible adverse reactions/side effects.    Please note   You were cared for by a hospitalist during your hospital stay. If you have any questions about your discharge medications or the care you received while you were in the hospital after you are discharged, you can call the  unit and asked to speak with the hospitalist on call if the hospitalist that took care of you is not available. Once you are discharged, your primary care physician will handle any further medical issues. Please note that NO REFILLS for any discharge medications will be authorized once you are discharged, as it is imperative that you return to your primary care physician (or establish a relationship with a primary care physician if you do not have one) for your aftercare needs so that they can reassess your need for medications and monitor your lab values.       Time coordinating discharge: Greater than 30 minutes  Signed:  [place] Kirchner  [facility] 01/26/2022, 2:59 PM   Pager on [email or web address]. If 7PM-7AM, please contact night-coverage at [email or web address]

## 2022-01-26 NOTE — Care Management Obs Status (Signed)
MEDICARE OBSERVATION STATUS NOTIFICATION ? ? ?Patient Details  ?Name: Madison Davidson ?MRN: [id number] ?Date of Birth: [birth year] ? ? ?Medicare Observation Status Notification Given:  Yes ? ? ? ?Shelbie Hutching, RN ?01/26/2022, 3:30 PM ?

## 2022-01-26 NOTE — Care Management CC44 (Signed)
Condition Code 44 Documentation Completed ? ?Patient Details  ?Name: Madison Davidson ?MRN: [id number] ?Date of Birth: [birth year] ? ? ?Condition Code 44 given:  Yes ?Patient signature on Condition Code 44 notice:  Yes ?Documentation of 2 MD's agreement:  Yes ?Code 44 added to claim:  Yes ? ? ? ?Shelbie Hutching, RN ?01/26/2022, 3:30 PM ? ?

## 2022-01-26 NOTE — TOC Transition Note (Signed)
Transition of Care (TOC) - CM/SW Discharge Note ? ? ?Patient Details  ?Name: Madison Davidson ?MRN: [id number] ?Date of Birth: February 22, 1951 ? ?Transition of Care (TOC) CM/SW Contact:  ?Shelbie Hutching, RN ?Phone Number: ?01/26/2022, 3:56 PM ? ? ?Clinical Narrative:    ?Patient admitted to the hospital with hypoglycemia, patient is now medically cleared for discharge home.  RNCM met with patient and daughter Madison Davidson at the bedside, introduced self and explained role.   ?Patient lives at home alone, she is independent for the most part, her daughters visit her every other day to check in, both daughters work long hours.  Madison Davidson fixes her pill box every week, and provides transportation.  Patient does not have a phone.  They agree to home health being set up.  Madison [facility] with [facility] for RN and SW.  [facility] reports that start of care will begin 01/29/22.   ?Daughter Madison Davidson is taking patient home today.   ?Patient is current with [facility] for PCP.  Follow up appointment has been scheduled.   ? ? ? ?Final next level of care: [place] ?Barriers to Discharge: Barriers Resolved ? ? ?Patient Goals and CMS Choice ?Patient states their goals for this hospitalization and ongoing recovery are:: Patient ready to go home and would like to have [facility] set up ?CMS Medicare.gov Compare Post Acute Care list provided to:: Patient ?Choice offered to / list presented to : Patient ? ?Discharge Placement ?  ?           ?  ?  ?  ?  ? ?Discharge Plan and Services ?  ?Discharge Planning Services: CM Consult ?Post Acute Care Choice: Home Health          ?DME Arranged: N/A ?DME Agency: NA ?  ?  ?  ?HH Arranged: Therapist, sports, Social Work ?[place] Agency: [place] ([place]) ?Date HH Agency Contacted: 01/26/22 ?Time [place]: 8270 ?Representative spoke with at [place]: Floydene Flock ? ?Social Determinants of Health (SDOH) Interventions ?  ? ? ?Readmission Risk Interventions ?No flowsheet data  found. ? ? ? ? ?

## 2022-01-26 NOTE — Progress Notes (Signed)
Patient discharged. Discharge instructions reviewed with patient and patient's daughter [name], including medication changes and follow up appointment. All belongings returned to patient including medications stored in the pharmacy. ?

## 2022-01-27 LAB — HEMOGLOBIN A1C
Hgb A1c MFr Bld: 5.9 % — ABNORMAL HIGH (ref 4.8–5.6)
Mean Plasma Glucose: 123 mg/dL

## 2022-06-05 ENCOUNTER — Emergency Department: Payer: Medicare Other

## 2022-06-05 ENCOUNTER — Inpatient Hospital Stay
Admission: EM | Admit: 2022-06-05 | Discharge: 2022-06-07 | DRG: 309 | Disposition: A | Discharge status: Home / Self Care | Payer: Medicare Other | Attending: Internal Medicine | Admitting: Internal Medicine

## 2022-06-05 ENCOUNTER — Other Ambulatory Visit: Payer: Self-pay

## 2022-06-05 DIAGNOSIS — R079 Chest pain, unspecified: Principal | ICD-10-CM

## 2022-06-05 DIAGNOSIS — D638 Anemia in other chronic diseases classified elsewhere: Secondary | ICD-10-CM | POA: Diagnosis present

## 2022-06-05 DIAGNOSIS — I480 Paroxysmal atrial fibrillation: Secondary | ICD-10-CM | POA: Diagnosis present

## 2022-06-05 DIAGNOSIS — E119 Type 2 diabetes mellitus without complications: Secondary | ICD-10-CM | POA: Diagnosis present

## 2022-06-05 DIAGNOSIS — Z79890 Hormone replacement therapy (postmenopausal): Secondary | ICD-10-CM

## 2022-06-05 DIAGNOSIS — Z7902 Long term (current) use of antithrombotics/antiplatelets: Secondary | ICD-10-CM

## 2022-06-05 DIAGNOSIS — Z8249 Family history of ischemic heart disease and other diseases of the circulatory system: Secondary | ICD-10-CM | POA: Diagnosis not present

## 2022-06-05 DIAGNOSIS — Z79899 Other long term (current) drug therapy: Secondary | ICD-10-CM

## 2022-06-05 DIAGNOSIS — I10 Essential (primary) hypertension: Secondary | ICD-10-CM | POA: Diagnosis present

## 2022-06-05 DIAGNOSIS — I4891 Unspecified atrial fibrillation: Secondary | ICD-10-CM | POA: Diagnosis present

## 2022-06-05 DIAGNOSIS — E039 Hypothyroidism, unspecified: Secondary | ICD-10-CM | POA: Diagnosis present

## 2022-06-05 DIAGNOSIS — Z634 Disappearance and death of family member: Secondary | ICD-10-CM | POA: Diagnosis not present

## 2022-06-05 DIAGNOSIS — E11649 Type 2 diabetes mellitus with hypoglycemia without coma: Secondary | ICD-10-CM

## 2022-06-05 DIAGNOSIS — I248 Other forms of acute ischemic heart disease: Secondary | ICD-10-CM | POA: Diagnosis present

## 2022-06-05 DIAGNOSIS — Z85828 Personal history of other malignant neoplasm of skin: Secondary | ICD-10-CM

## 2022-06-05 DIAGNOSIS — Z7982 Long term (current) use of aspirin: Secondary | ICD-10-CM | POA: Diagnosis not present

## 2022-06-05 DIAGNOSIS — Z7984 Long term (current) use of oral hypoglycemic drugs: Secondary | ICD-10-CM | POA: Diagnosis not present

## 2022-06-05 DIAGNOSIS — Z8673 Personal history of transient ischemic attack (TIA), and cerebral infarction without residual deficits: Secondary | ICD-10-CM | POA: Diagnosis not present

## 2022-06-05 DIAGNOSIS — R778 Other specified abnormalities of plasma proteins: Secondary | ICD-10-CM | POA: Diagnosis present

## 2022-06-05 DIAGNOSIS — E876 Hypokalemia: Secondary | ICD-10-CM | POA: Diagnosis present

## 2022-06-05 DIAGNOSIS — E785 Hyperlipidemia, unspecified: Secondary | ICD-10-CM | POA: Diagnosis present

## 2022-06-05 DIAGNOSIS — I2510 Atherosclerotic heart disease of native coronary artery without angina pectoris: Secondary | ICD-10-CM | POA: Diagnosis present

## 2022-06-05 DIAGNOSIS — R7989 Other specified abnormal findings of blood chemistry: Secondary | ICD-10-CM | POA: Diagnosis present

## 2022-06-05 LAB — CBG MONITORING, ED
Glucose-Capillary: 109 mg/dL — ABNORMAL HIGH (ref 70–99)
Glucose-Capillary: 111 mg/dL — ABNORMAL HIGH (ref 70–99)

## 2022-06-05 LAB — BASIC METABOLIC PANEL
Anion gap: 9 (ref 5–15)
BUN: 13 mg/dL (ref 8–23)
CO2: 21 mmol/L — ABNORMAL LOW (ref 22–32)
Calcium: 8.5 mg/dL — ABNORMAL LOW (ref 8.9–10.3)
Chloride: 109 mmol/L (ref 98–111)
Creatinine, Ser: 0.62 mg/dL (ref 0.44–1.00)
GFR, Estimated: >60 mL/min (ref >60)
Glucose, Bld: 102 mg/dL — ABNORMAL HIGH (ref 70–99)
Potassium: 3.6 mmol/L (ref 3.5–5.1)
Sodium: 139 mmol/L (ref 135–145)

## 2022-06-05 LAB — CBC
HCT: 31.5 % — ABNORMAL LOW (ref 36.0–46.0)
Hemoglobin: 9.7 g/dL — ABNORMAL LOW (ref 12.0–15.0)
MCH: 26.4 pg (ref 26.0–34.0)
MCHC: 30.8 g/dL (ref 30.0–36.0)
MCV: 85.8 fL (ref 80.0–100.0)
Platelets: 376 10*3/uL (ref 150–400)
RBC: 3.67 MIL/uL — ABNORMAL LOW (ref 3.87–5.11)
RDW: 14.4 % (ref 11.5–15.5)
WBC: 9 10*3/uL (ref 4.0–10.5)
nRBC: 0 % (ref 0.0–0.2)

## 2022-06-05 LAB — PROTIME-INR
INR: 1.1 (ref 0.8–1.2)
Prothrombin Time: 13.6 seconds (ref 11.4–15.2)

## 2022-06-05 LAB — HEPARIN LEVEL (UNFRACTIONATED): Heparin Unfractionated: 0.58 IU/mL (ref 0.30–0.70)

## 2022-06-05 LAB — APTT: aPTT: 29 seconds (ref 24–36)

## 2022-06-05 LAB — GLUCOSE, CAPILLARY: Glucose-Capillary: 99 mg/dL (ref 70–99)

## 2022-06-05 LAB — TROPONIN I (HIGH SENSITIVITY)
Troponin I (High Sensitivity): 8 ng/L (ref <18)
Troponin I (High Sensitivity): 32 ng/L — ABNORMAL HIGH (ref <18)
Troponin I (High Sensitivity): 337 ng/L (ref <18)

## 2022-06-05 LAB — TSH: TSH: 0.149 u[IU]/mL — ABNORMAL LOW (ref 0.350–4.500)

## 2022-06-05 MED ORDER — MELATONIN 5 MG PO TABS
5.0000 mg | ORAL_TABLET | Freq: Every day | ORAL | Status: DC
  Administered 2022-06-05 – 2022-06-06 (×2): 5 mg via ORAL
  Filled 2022-06-05 (×2): qty 1

## 2022-06-05 MED ORDER — ASPIRIN 81 MG PO TBEC: 81.0000 mg | DELAYED_RELEASE_TABLET | Freq: Every day | ORAL | Status: DC

## 2022-06-05 MED ORDER — HEPARIN (PORCINE) 25000 UT/250ML-% IV SOLN
1000.0000 [IU]/h | INTRAVENOUS | Status: DC
  Administered 2022-06-05 – 2022-06-06 (×2): 1000 [IU]/h via INTRAVENOUS
  Filled 2022-06-05 (×2): qty 250

## 2022-06-05 MED ORDER — ONDANSETRON HCL 4 MG/2ML IJ SOLN: 4.0000 mg | Freq: Four times a day (QID) | INTRAMUSCULAR | Status: DC | PRN

## 2022-06-05 MED ORDER — HEPARIN BOLUS VIA INFUSION
3300.0000 [IU] | Freq: Once | INTRAVENOUS | Status: AC
  Administered 2022-06-05: 3300 [IU] via INTRAVENOUS
  Filled 2022-06-05: qty 3300

## 2022-06-05 MED ORDER — MELATONIN 3 MG PO TABS
3.0000 mg | ORAL_TABLET | Freq: Every day | ORAL | Status: DC
  Filled 2022-06-05: qty 1

## 2022-06-05 MED ORDER — INSULIN ASPART 100 UNIT/ML IJ SOLN
0.0000 [IU] | Freq: Three times a day (TID) | INTRAMUSCULAR | Status: DC
  Administered 2022-06-06 (×2): 3 [IU] via SUBCUTANEOUS
  Administered 2022-06-07: 2 [IU] via SUBCUTANEOUS
  Administered 2022-06-07: 3 [IU] via SUBCUTANEOUS
  Administered 2022-06-07: 2 [IU] via SUBCUTANEOUS
  Filled 2022-06-05 (×5): qty 1

## 2022-06-05 MED ORDER — AMIODARONE HCL 200 MG PO TABS
200.0000 mg | ORAL_TABLET | Freq: Two times a day (BID) | ORAL | Status: DC
  Administered 2022-06-05 – 2022-06-07 (×4): 200 mg via ORAL
  Filled 2022-06-05 (×4): qty 1

## 2022-06-05 MED ORDER — DILTIAZEM HCL 25 MG/5ML IV SOLN
15.0000 mg | Freq: Once | INTRAVENOUS | Status: AC
Start: 2022-06-05 — End: 2022-06-05
  Administered 2022-06-05: 15 mg via INTRAVENOUS
  Filled 2022-06-05: qty 5

## 2022-06-05 MED ORDER — AMIODARONE LOAD VIA INFUSION
150.0000 mg | Freq: Once | INTRAVENOUS | Status: AC
  Administered 2022-06-05: 150 mg via INTRAVENOUS
  Filled 2022-06-05: qty 83.34

## 2022-06-05 MED ORDER — AMIODARONE HCL 200 MG PO TABS: 200.0000 mg | ORAL_TABLET | Freq: Every day | ORAL | Status: DC

## 2022-06-05 MED ORDER — CALCIUM POLYCARBOPHIL 625 MG PO TABS
625.0000 mg | ORAL_TABLET | Freq: Two times a day (BID) | ORAL | Status: DC
  Administered 2022-06-06 – 2022-06-07 (×3): 625 mg via ORAL
  Filled 2022-06-05 (×7): qty 1

## 2022-06-05 MED ORDER — LISINOPRIL 20 MG PO TABS
20.0000 mg | ORAL_TABLET | Freq: Every day | ORAL | Status: DC
  Administered 2022-06-05 – 2022-06-07 (×3): 20 mg via ORAL
  Filled 2022-06-05: qty 1
  Filled 2022-06-05: qty 2
  Filled 2022-06-05: qty 1

## 2022-06-05 MED ORDER — AMIODARONE HCL IN DEXTROSE 360-4.14 MG/200ML-% IV SOLN
60.0000 mg/h | INTRAVENOUS | Status: DC
  Administered 2022-06-05: 60 mg/h via INTRAVENOUS
  Filled 2022-06-05 (×2): qty 200

## 2022-06-05 MED ORDER — AMIODARONE HCL IN DEXTROSE 360-4.14 MG/200ML-% IV SOLN: 30.0000 mg/h | INTRAVENOUS | Status: DC

## 2022-06-05 MED ORDER — VITAMIN D (ERGOCALCIFEROL) 1.25 MG (50000 UNIT) PO CAPS: 50000.0000 [IU] | ORAL_CAPSULE | ORAL | Status: DC

## 2022-06-05 MED ORDER — RISAQUAD PO CAPS: 2.0000 | ORAL_CAPSULE | Freq: Three times a day (TID) | ORAL | Status: DC

## 2022-06-05 MED ORDER — CLOPIDOGREL BISULFATE 75 MG PO TABS
75.0000 mg | ORAL_TABLET | Freq: Every day | ORAL | Status: DC
  Administered 2022-06-05 – 2022-06-07 (×3): 75 mg via ORAL
  Filled 2022-06-05 (×3): qty 1

## 2022-06-05 MED ORDER — APIXABAN 5 MG PO TABS: 5.0000 mg | ORAL_TABLET | Freq: Two times a day (BID) | ORAL | Status: DC

## 2022-06-05 MED ORDER — LEVOTHYROXINE SODIUM 112 MCG PO TABS
112.0000 ug | ORAL_TABLET | Freq: Every day | ORAL | Status: DC
  Administered 2022-06-06: 112 ug via ORAL
  Filled 2022-06-05 (×2): qty 1

## 2022-06-05 MED ORDER — ONDANSETRON HCL 4 MG PO TABS: 4.0000 mg | ORAL_TABLET | Freq: Four times a day (QID) | ORAL | Status: DC | PRN

## 2022-06-05 MED ORDER — ACETAMINOPHEN 325 MG PO TABS
650.0000 mg | ORAL_TABLET | Freq: Four times a day (QID) | ORAL | Status: DC | PRN
  Administered 2022-06-07: 650 mg via ORAL
  Filled 2022-06-05: qty 2

## 2022-06-05 MED ORDER — CYANOCOBALAMIN 500 MCG PO TABS
500.0000 ug | ORAL_TABLET | Freq: Every day | ORAL | Status: DC
  Filled 2022-06-05: qty 1

## 2022-06-05 MED ORDER — DILTIAZEM HCL-DEXTROSE 125-5 MG/125ML-% IV SOLN (PREMIX)
5.0000 mg/h | INTRAVENOUS | Status: DC
  Administered 2022-06-05: 5 mg/h via INTRAVENOUS
  Filled 2022-06-05: qty 125

## 2022-06-05 MED ORDER — ATORVASTATIN CALCIUM 80 MG PO TABS
80.0000 mg | ORAL_TABLET | Freq: Every day | ORAL | Status: DC
  Administered 2022-06-05 – 2022-06-06 (×2): 80 mg via ORAL
  Filled 2022-06-05 (×2): qty 1

## 2022-06-05 NOTE — Assessment & Plan Note (Signed)
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 Continue aspirin, Plavix and atorvastatin

## 2022-06-05 NOTE — Progress Notes (Signed)
ANTICOAGULATION CONSULT NOTE - Initial Consult  Pharmacy Consult for heparin drip Indication: atrial fibrillation  No Known Allergies  Patient Measurements: Height: '5\' 1"'$  (154.9 cm) Weight: 81.6 kg (180 lb) IBW/kg (Calculated) : 47.8 Heparin Dosing Weight: 66 kg  Vital Signs: Temp: 97.6 F (36.4 C) (07/11 0734) BP: 128/88 (07/11 0900) Pulse Rate: 150 (07/11 0900)  Labs: Recent Labs    06/05/22 0742  HGB 9.7*  HCT 31.5*  PLT 376  CREATININE 0.62  TROPONINIHS 8    Estimated Creatinine Clearance: 61.5 mL/min (by C-G formula based on SCr of 0.62 mg/dL).   Medical History: Past Medical History:  Diagnosis Date   Cancer ([place])    skin   Coronary artery disease    Diabetes mellitus without complication ([employer])    Hypertension    Stroke ([place])     Medications:  Scheduled:   amiodarone  150 mg Intravenous Once   heparin  3,300 Units Intravenous Once   Infusions:   amiodarone     Followed by   amiodarone     diltiazem (CARDIZEM) infusion 15 mg/hr (06/05/22 0856)   heparin      Assessment: 71 yo F to start heparin drip for Afib. Hx STEMI Hgb 9.7  Plt 376  aPTT pending,  INR pending Anticoagulants PTA: ASA, plavix  Goal of Therapy:  Heparin level 0.3-0.7 units/ml Monitor platelets by anticoagulation protocol: Yes   Plan:  Give 3300 units bolus x 1 Start heparin infusion at 1000 units/hr Check anti-Xa level in 8 hours and daily while on heparin Continue to monitor H&H and platelets  Omaya Nieland A 06/05/2022,9:31 AM

## 2022-06-05 NOTE — Plan of Care (Signed)

## 2022-06-05 NOTE — ED Notes (Signed)
Amiodarone held per Dr Francine Graven

## 2022-06-05 NOTE — Consult Note (Addendum)
[place] CARDIOLOGY CONSULT NOTE       Patient ID: ANH BIGOS MRN: [id number] DOB/AGE: 73-Feb-1952 73 y.o.  Admit date: 06/05/2022 Referring Physician Dr. Francine Graven Primary Physician  Primary Cardiologist Nehemiah Massed Reason for Consultation new onset AF RVR  HPI: Madison Davidson is a 71yoF with a PMH of hypertension, hyperlipidemia type 2 diabetes, CVA, obesity who presented to [facility] ED 06/05/22 with chest pain and was found to be in atrial fibrillation with RVR. Cardiology is consulted for further assistance.   She started feeling poorly on Sunday after the memorial service for her son who passed away last week suddenly from a heart attack. She was hurting in her left chest that started on Sunday and she was concerned she was also having a heart attack.  She said the pain was that was constant in nature and felt tired, not worse or better with anything in particular and does not radiate. She denies any palpitations, dizziness, or LE swelling.  She says her mouth has been dry but has no other complaints.  She was initially started on a diltiazem drip at 15 mg/h and rates were in the 150s to 160s.  She was given an amiodarone bolus with conversion to sinus bradycardia to sinus rhythm with rates as low as 53, but mostly in the high 50s to low 60s thereafter.  I reassessed her after she converted to sinus rhythm and she said her left chest discomfort resolved and she feels okay, the diltiazem and amiodarone infusions were discontinued.  She has been followed by Dr. Nehemiah Massed for a number of years for stable angina and risk factor modification, most recent treadmill Myoview was done in April 2021 that showed normal myocardial perfusion without evidence of ischemia.  She does not think she has ever had a heart catheterization.  Vitals are notable for blood pressure during interview of 133/72, SPO2 99% on room air.  Heart rate in sinus rhythm on telemetry with rates 62.   Labs are notable for a  potassium of 3.6, BUN/creatinine 13/0.62, GFR greater than 60.  High-sensitivity troponin minimally elevated and flat trending thus far in 8-32.  H&H 9.7/31.5, decreased from her baseline 4 months ago at 12.5.  Does have previous anemia last July with hemoglobin around 10.  Chest x-ray negative for active cardiopulmonary disease.  Review of systems complete and found to be negative unless listed above     Past Medical History:  Diagnosis Date   Cancer ([place])    skin   Coronary artery disease    Diabetes mellitus without complication ([place])    Hypertension    Stroke [facility])     Past Surgical History:  Procedure Laterality Date   ABDOMINAL HYSTERECTOMY     IR ANGIO INTRA EXTRACRAN SEL INTERNAL CAROTID BILAT MOD SED  05/11/2021   IR ANGIO VERTEBRAL SEL VERTEBRAL UNI R MOD SED  05/11/2021   IR ANGIOGRAM EXTREMITY LEFT  05/11/2021   IR CT HEAD LTD  05/11/2021   IR INTRA CRAN STENT  05/11/2021   IR INTRA CRAN STENT  05/12/2021   IR US GUIDE VASC ACCESS RIGHT  05/11/2021   RADIOLOGY WITH ANESTHESIA N/A 05/11/2021   Procedure: IR WITH ANESTHESIA - INTRACRANIAL STENT;  Surgeon: Pedro Earls, MD;  Location: [place];  Service: Radiology;  Laterality: N/A;    (Not in a hospital admission)  Social History   Socioeconomic History   Marital status: Divorced    Spouse name: Not on file   Number  of children: Not on file   Years of education: Not on file   Highest education level: Not on file  Occupational History   Not on file  Tobacco Use   Smoking status: Never   Smokeless tobacco: Never  Substance and Sexual Activity   Alcohol use: No   Drug use: No   Sexual activity: Not on file  Other Topics Concern   Not on file  Social History Narrative   Not on file   Social Determinants of Health   Financial Resource Strain: Not on file  Food Insecurity: Not on file  Transportation Needs: Not on file  Physical Activity: Not on file  Stress: Not on file  Social Connections: Not  on file  Intimate Partner Violence: Not on file    Family History  Problem Relation Age of Onset   Heart disease Mother    Heart disease Father       PHYSICAL EXAM General: Pleasant elderly Caucasian female, well nourished, in no acute distress. HEENT:  Normocephalic and atraumatic. Neck:  No JVD.  Lungs: Normal respiratory effort on room air. Clear bilaterally to auscultation. No wheezes, crackles, rhonchi.  Heart: HRRR . Normal S1 and S2 without gallops or murmurs. Radial & DP pulses 2+ bilaterally. Abdomen: Obese appearing.  Msk: Normal strength and tone for age. Extremities: Warm and well perfused. No clubbing, cyanosis.  No peripheral edema.  Neuro: Alert and oriented X 3. Psych: Flat affect. answers questions appropriately.   Labs:   Lab Results  Component Value Date   WBC 9.0 06/05/2022   HGB 9.7 (L) 06/05/2022   HCT 31.5 (L) 06/05/2022   MCV 85.8 06/05/2022   PLT 376 06/05/2022    Recent Labs  Lab 06/05/22 0742  NA 139  K 3.6  CL 109  CO2 21*  BUN 13  CREATININE 0.62  CALCIUM 8.5*  GLUCOSE 102*   No results found for: "CKTOTAL", "CKMB", "CKMBINDEX", "TROPONINI"  Lab Results  Component Value Date   CHOL 198 05/09/2021   Lab Results  Component Value Date   HDL 35 (L) 05/09/2021   Lab Results  Component Value Date   LDLCALC 119 (H) 05/09/2021   Lab Results  Component Value Date   TRIG 220 (H) 05/09/2021   Lab Results  Component Value Date   CHOLHDL 5.7 05/09/2021   No results found for: "LDLDIRECT"    Radiology: [facility] Chest Port 1 View  Result Date: 06/05/2022 CLINICAL DATA:  71 year old female with history of chest pain and shortness of breath. EXAM: PORTABLE CHEST 1 VIEW COMPARISON:  Chest x-ray 01/24/2022. FINDINGS: Lung volumes are normal. No consolidative airspace disease. No pleural effusions. No pneumothorax. No pulmonary nodule or mass noted. Pulmonary vasculature and the cardiomediastinal silhouette are within normal limits. IMPRESSION:  No radiographic evidence of acute cardiopulmonary disease. Electronically Signed   By: Vinnie Langton M.D.   On: 06/05/2022 07:58    ECHO 02/2020 ECHOCARDIOGRAPHIC MEASUREMENTS  2D DIMENSIONS  AORTA                  Values   Normal Range   MAIN PA         Values    Normal Range                Annulus: nm*          [2.1-2.5]         PA Main: nm*       [1.5-2.1]  Aorta Sin: 3.0 cm       [2.7-3.3]    RIGHT VENTRICLE            ST Junction: nm*          [2.3-2.9]         RV Base: nm*       [<4.2]              Asc.Aorta: nm*          [2.3-3.1]          RV Mid: nm*       [<3.5]  LEFT VENTRICLE                                      RV Length: nm*       [<8.6]                  LVIDd: 4.8 cm       [3.9-5.3]    INFERIOR VENA CAVA                  LVIDs: 3.2 cm                        Max. IVC: nm*       [<=2.1]                     FS: 32.9 %       [>25]            Min. IVC: nm*                    SWT: 0.84 cm      [0.5-0.9]    ------------------                    PWT: 1.0 cm       [0.5-0.9]    nm* - not measured  LEFT ATRIUM                LA Diam: 3.4 cm       [2.7-3.8]            LA A4C Area: nm*          [<20]              LA Volume: nm*          [22-52]  _________________________________________________________________________________________  ECHOCARDIOGRAPHIC DESCRIPTIONS  AORTIC ROOT                   Size: Normal             Dissection: INDETERM FOR DISSECTION  AORTIC VALVE               Leaflets: Tricuspid                   Morphology: Normal               Mobility: Fully mobile  LEFT VENTRICLE                   Size: Normal                        Anterior: Normal            Contraction: Normal  Lateral: Normal             Closest EF: >55% (Estimated)                Septal: Normal              LV Masses: No Masses                       Apical: Normal                    LVH: None                          Inferior: Normal                                                       Posterior: Normal           Dias.FxClass: (Grade 1) relaxation abnormal, E/A reversal  MITRAL VALVE               Leaflets: Normal                        Mobility: Fully mobile             Morphology: Normal  LEFT ATRIUM                   Size: Normal                       LA Masses: No masses              IA Septum: Normal IAS  MAIN PA                   Size: Normal  PULMONIC VALVE             Morphology: Normal                        Mobility: Fully mobile  RIGHT VENTRICLE              RV Masses: No Masses                         Size: Normal              Free Wall: Normal                     Contraction: Normal  TRICUSPID VALVE               Leaflets: Normal                        Mobility: Fully mobile             Morphology: Normal  RIGHT ATRIUM                   Size: Normal                        RA Other: None                RA Mass: No masses  PERICARDIUM                  Fluid: No effusion  INFERIOR VENACAVA                   Size: Normal Normal respiratory collapse  _________________________________________________________________________________________   DOPPLER ECHO and OTHER SPECIAL PROCEDURES                 Aortic: TRIVIAL AR                 No AS                         135.2 cm/sec peak vel      7.3 mmHg peak grad                         3.5 mmHg mean grad         2.5 cm^2 by DOPPLER                 Mitral: MILD MR                    No MS                         MV Inflow E Vel = 115.0 cm/sec      MV Annulus E'Vel = 4.1 cm/sec                         E/E'Ratio = 28.0              Tricuspid: TRIVIAL TR                 No TS              Pulmonary: TRIVIAL PR                 No PS  _________________________________________________________________________________________  INTERPRETATION  NORMAL LEFT VENTRICULAR SYSTOLIC FUNCTION WITH AN ESTIMATED EF = >55 %  NORMAL RIGHT VENTRICULAR SYSTOLIC FUNCTION  MILD MITRAL VALVE INSUFFICIENCY   TRACE TRICUSPID AND AORTIC VALVE INSUFFICIENCY  NO VALVULAR STENOSIS   TELEMETRY reviewed by me: Initially atrial fibrillation with rates peaking in the 160s, converted to sinus bradycardia to sinus rhythm with rates 57-low 60s   EKG reviewed by me: AF RVR rate 156  ASSESSMENT AND PLAN:  Hopie Pellegrin is a 37yoF with a PMH of hypertension, hyperlipidemia type 2 diabetes, CVA, obesity who presented to [facility] ED 06/05/22 with chest pain and was found to be in atrial fibrillation with RVR. Cardiology is consulted for further assistance.   #New onset paroxysmal atrial fibrillation with RVR, converted to NSR  In the setting of recent loss of her son due to an MI last week, started feeling left-sided constant chest discomfort on Sunday, not relieved or exacerbated by anything in particular.  Found to be in A-fib with RVR with rates in the 160s on admission, new in onset.  She was started on a diltiazem drip for 3 hours, and after amiodarone bolus she converted to sinus rhythm and her chest discomfort resolved.  Her troponin on the first 2 repeats are 8-32, will trend until peak, although this is likely demand in the setting of rapid heart rate. -Discontinue diltiazem gtt. -S/p amiodarone bolus with conversion to NSR, will continue load with amiodarone 200 mg  p.o. twice daily x5 days, then 200 mg daily thereafter -Continue heparin drip for today, will likely start on Eliquis 5 mg twice daily for stroke risk reduction starting tomorrow. CHA2DS2-VASc 6 -Ordered echocardiogram complete  This patient's plan of care was discussed and created with Dr. Nehemiah Massed and he is in agreement.  Signed: Tristan Schroeder , PA-C 06/05/2022, 10:28 AM [facility] Cardiology  The patient has been interviewed and examined. I agree with assessment and plan above. Serafina Royals MD [facility]

## 2022-06-05 NOTE — Progress Notes (Signed)
[place] for heparin drip Indication: atrial fibrillation  No Known Allergies  Patient Measurements: Height: '5\' 1"'$  (154.9 cm) Weight: 81.6 kg (180 lb) IBW/kg (Calculated) : 47.8 Heparin Dosing Weight: 66 kg  Vital Signs: BP: 136/50 (07/11 1800) Pulse Rate: 58 (07/11 1800)  Labs: Recent Labs    06/05/22 0742 06/05/22 0951 06/05/22 1921  HGB 9.7*  --   --   HCT 31.5*  --   --   PLT 376  --   --   APTT 29  --   --   LABPROT 13.6  --   --   INR 1.1  --   --   HEPARINUNFRC  --   --  0.58  CREATININE 0.62  --   --   TROPONINIHS 8 32* TEST REQUEST RECEIVED WITHOUT APPROPRIATE SPECIMEN     Estimated Creatinine Clearance: 61.5 mL/min (by C-G formula based on SCr of 0.62 mg/dL).   Medical History: Past Medical History:  Diagnosis Date   Cancer ([place])    skin   Coronary artery disease    Diabetes mellitus without complication ([place])    Hypertension    Stroke ([place])     Medications:  Scheduled:   amiodarone  200 mg Oral BID   Followed by   Derrill Memo ON 06/10/2022] amiodarone  200 mg Oral Daily   [START ON 06/06/2022] apixaban  5 mg Oral BID   atorvastatin  80 mg Oral QHS   clopidogrel  75 mg Oral Daily   insulin aspart  0-15 Units Subcutaneous TID WC   levothyroxine  112 mcg Oral Daily   lisinopril  20 mg Oral Daily   melatonin  5 mg Oral QHS   polycarbophil  625 mg Oral BID   Infusions:   diltiazem (CARDIZEM) infusion Stopped (06/05/22 1010)   heparin 1,000 Units/hr (06/05/22 0946)    Assessment: 71 yo F to start heparin drip for Afib. Hx STEMI Hgb 9.7  Plt 376.    7/11 1921 HL 0.58     Goal of Therapy:  Heparin level 0.3-0.7 units/ml Monitor platelets by anticoagulation protocol: Yes   Plan:  Heparin level is therapeutic. Will continue heparin infusion at 1000 units/hr. Recheck heparin level in 8 hours. CBC daily while on heparin. Starting apixaban in the AM per cards.    Oswald Hillock 06/05/2022,8:24 PM

## 2022-06-05 NOTE — H&P (Addendum)
History and Physical    Patient: Madison Davidson RFF:[id number] DOB: 11/23/1951 DOA: 06/05/2022 DOS: the patient was seen and examined on 06/05/2022 PCP: [facility]  Patient coming from: Home  Chief Complaint:  Chief Complaint  Patient presents with   Chest Pain   HPI: Madison Davidson is a 71 y.o. female with medical history significant for diabetes mellitus, hypertension, coronary artery disease who presents to the ER for evaluation of a 2-day history of chest pain that woke her up out of her sleep.  Pain has been mostly midsternal associated with shortness of breath, nonradiating and she denies having any associated symptoms such as nausea, vomiting, diaphoresis.  She has had palpitations.  Chest pain is not related to rest or exertion. Patient lost her son about a week ago and he was cremated on Sunday (06/03/22) She denies having any cough, no fever, no chills, no dizziness, no lightheadedness, no leg swelling, no headache, no changes in her bowel habits or any urinary symptoms, no blurred vision no focal deficit. Upon arrival to the ER she was noted to be tachycardic with heart rate of 180 and received aspirin 324 mg and 2.5 mg of metoprolol IV.  She also received 400 cc of normal saline.   Review of Systems: As mentioned in the history of present illness. All other systems reviewed and are negative. Past Medical History:  Diagnosis Date   Cancer ([place])    skin   Coronary artery disease    Diabetes mellitus without complication ([place])    Hypertension    Stroke [facility])    Past Surgical History:  Procedure Laterality Date   ABDOMINAL HYSTERECTOMY     IR ANGIO INTRA EXTRACRAN SEL INTERNAL CAROTID BILAT MOD SED  05/11/2021   IR ANGIO VERTEBRAL SEL VERTEBRAL UNI R MOD SED  05/11/2021   IR ANGIOGRAM EXTREMITY LEFT  05/11/2021   IR CT HEAD LTD  05/11/2021   IR INTRA CRAN STENT  05/11/2021   IR INTRA CRAN STENT  05/12/2021   IR US GUIDE VASC ACCESS RIGHT  05/11/2021    RADIOLOGY WITH ANESTHESIA N/A 05/11/2021   Procedure: IR WITH ANESTHESIA - INTRACRANIAL STENT;  Surgeon: Pedro Earls, MD;  Location: [place];  Service: Radiology;  Laterality: N/A;   Social History:  reports that she has never smoked. She has never used smokeless tobacco. She reports that she does not drink alcohol and does not use drugs.  No Known Allergies  Family History  Problem Relation Age of Onset   Heart disease Mother    Heart disease Father     Prior to Admission medications   Medication Sig Start Date End Date Taking? Authorizing Provider  acetaminophen (TYLENOL) 325 MG tablet Take 2 tablets (650 mg total) by mouth every 6 (six) hours as needed for mild pain, fever or headache. 05/24/21   Angiulli, Lavon Paganini, PA-C  acidophilus (RISAQUAD) CAPS capsule Take 2 capsules by mouth 3 (three) times daily. 05/24/21   Angiulli, Lavon Paganini, PA-C  aspirin EC 81 MG EC tablet Take 1 tablet (81 mg total) by mouth daily. Swallow whole. 05/26/21   Angiulli, Lavon Paganini, PA-C  atorvastatin (LIPITOR) 20 MG tablet Take 4 tablets (80 mg total) by mouth at bedtime. 05/24/21   Angiulli, Lavon Paganini, PA-C  clopidogrel (PLAVIX) 75 MG tablet Take 75 mg by mouth daily. 11/13/21   [provider]  ketoconazole (NIZORAL) 2 % cream SMARTSIG:1 Application Topical 1 to 2 Times Daily 09/21/21  [provider]  levothyroxine (SYNTHROID) 112 MCG tablet Take 112 mcg by mouth daily. 10/30/21   [provider]  lisinopril (ZESTRIL) 20 MG tablet Take 1 tablet (20 mg total) by mouth daily. 05/24/21   Angiulli, Lavon Paganini, PA-C  melatonin 3 MG TABS tablet Take 3 mg by mouth at bedtime. 06/08/21   [provider]  metFORMIN (GLUCOPHAGE) 1000 MG tablet Take 1 tablet (1,000 mg total) by mouth 2 (two) times daily with a meal. 05/24/21   Angiulli, Lavon Paganini, PA-C  mupirocin ointment (BACTROBAN) 2 % SMARTSIG:1 Application Topical 2-3 Times Daily 11/10/21   [provider]  polycarbophil  (FIBERCON) 625 MG tablet Take 1 tablet (625 mg total) by mouth 2 (two) times daily. 05/24/21   Angiulli, Lavon Paganini, PA-C  vitamin B-12 (CYANOCOBALAMIN) 500 MCG tablet Take 1 tablet (500 mcg total) by mouth daily. 06/14/21   Hosie Poisson, MD  Vitamin D, Ergocalciferol, (DRISDOL) 1.25 MG (50000 UNIT) CAPS capsule Take 50,000 Units by mouth once a week. 07/17/21   [provider]    Physical Exam: Vitals:   06/05/22 0830 06/05/22 0856 06/05/22 0900 06/05/22 0945  BP: (!) 127/108  128/88   Pulse: (!) 159 (!) 130 (!) 150 92  Resp: (!) 22 10 (!) 22 (!) 21  Temp:      SpO2: 100% 99% 98% 98%  Weight:      Height:       Physical Exam Vitals and nursing note reviewed.  Constitutional:      Appearance: She is well-developed.  HENT:     Head: Normocephalic.  Eyes:     Pupils: Pupils are equal, round, and reactive to light.  Cardiovascular:     Rate and Rhythm: Tachycardia present. Rhythm irregular.  Pulmonary:     Effort: Pulmonary effort is normal.     Breath sounds: Normal breath sounds.  Abdominal:     General: Bowel sounds are normal.     Palpations: Abdomen is soft.  Musculoskeletal:        General: Normal range of motion.     Cervical back: Normal range of motion and neck supple.  Skin:    General: Skin is warm and dry.  Neurological:     General: No focal deficit present.     Mental Status: She is alert.  Psychiatric:        Mood and Affect: Mood normal.     Data Reviewed: Relevant notes from primary care and specialist visits, past discharge summaries as available in EHR, including Care Everywhere. Prior diagnostic testing as pertinent to current admission diagnoses Updated medications and problem lists for reconciliation ED course, including vitals, labs, imaging, treatment and response to treatment Triage notes, nursing and pharmacy notes and ED provider's notes Notable results as noted in HPI Labs reviewed.  Troponin 32, PT 13.6, INR 1.1, sodium 139, potassium  3.6, chloride 109, bicarb 21, glucose 102, BUN 13, creatinine 0.62, calcium 8.5, white count 9.0, hemoglobin 9.7, hematocrit 31.5, RDW 14.4, platelet count 376 Twelve-lead EKG reviewed by me shows A-fib with rapid ventricular rate There are no new results to review at this time.  Assessment and Plan: * Atrial fibrillation with rapid ventricular response ([place]) New onset Patient presents for evaluation of chest pain and was found to be in rapid A-fib. 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 Continue aspirin, Plavix and atorvastatin  Hypertension Continue lisinopril for blood pressure control  Elevated troponin Most likely secondary to demand ischemia from rapid A-fib. We will cycle cardiac enzymes  Anemia of chronic disease Patient has anemia of chronic disease but noted to have an almost 2 g drop in her H&H She denies having any hematochezia or passage of melena stools and denies NSAID use She is on aspirin and Plavix and currently started on heparin drip We will monitor H&H closely      Advance Care Planning:   Code Status: Full Code   Consults: Cardiology  Family Communication: Greater than 50% of time spent discussing patient's condition and plan of care with her at the bedside.  All questions and concerns have been addressed.  She verbalizes understanding and agrees with the plan.  Severity of Illness: The appropriate patient status for this patient is INPATIENT. Inpatient status is judged to be reasonable and necessary in order to provide the required intensity of service to ensure the  patient's safety. The patient's presenting symptoms, physical exam findings, and initial radiographic and laboratory data in the context of their chronic comorbidities is felt to place them at high risk for further clinical deterioration. Furthermore, it is not anticipated that the patient will be medically stable for discharge from the hospital within 2 midnights of admission.   * I certify that at the point of admission it is my clinical judgment that the patient will require inpatient hospital care spanning beyond 2 midnights from the point of admission due to high intensity of service, high risk for further deterioration and high frequency of surveillance required.*  Author: Collier Bullock, MD 06/05/2022 11:11 AM  For on call review [email or web address].

## 2022-06-05 NOTE — ED Notes (Signed)
Pt HR <60 after amiodarone bolus. Cardizem stopped

## 2022-06-05 NOTE — Assessment & Plan Note (Addendum)
Most likely secondary to demand ischemia from rapid A-fib. We will cycle cardiac enzymes

## 2022-06-05 NOTE — ED Notes (Signed)
EDP at bedside  

## 2022-06-05 NOTE — Progress Notes (Signed)
Lab called and reported a critical lab troponin of 337. Pt no care of chest pain.NP [place] made aware. Will continue to monitor.

## 2022-06-05 NOTE — ED Provider Notes (Signed)
[facility] Provider Note    Event Date/Time   First MD Initiated Contact with Patient 06/05/22 [phone]     (approximate)   History   Chest Pain   HPI  Madison Davidson is a 71 y.o. female  who presents to the emergency department today because of concern for chest pain. Started 2 days ago. Located in the center part of her chest. Feels like a pressure. Does not radiate. Has been accompanied by shortness of breath. Denies similar pain in the past. States son died one week ago and they cremated his remains 2 days ago.     Physical Exam   Triage Vital Signs: ED Triage Vitals  Enc Vitals Group     BP 06/05/22 0734 (!) 151/132     Pulse Rate 06/05/22 0734 (!) 156     Resp 06/05/22 0734 20     Temp 06/05/22 0734 97.6 F (36.4 C)     Temp src --      SpO2 06/05/22 0734 99 %     Weight 06/05/22 0735 180 lb (81.6 kg)     Height 06/05/22 0735 '5\' 1"'$  (1.549 m)     Head Circumference --      Peak Flow --      Pain Score 06/05/22 0735 4     Pain Loc --      Pain Edu? --      Excl. in [place]? --     Most recent vital signs: Vitals:   06/05/22 0734  BP: (!) 151/132  Pulse: (!) 156  Resp: 20  Temp: 97.6 F (36.4 C)  SpO2: 99%    General: Awake, alert. CV:  Good peripheral perfusion. Irregular rate and rhythm.  Resp:  Normal effort. Lungs clear. Abd:  No distention. Non tender.    ED Results / Procedures / Treatments   Labs (all labs ordered are listed, but only abnormal results are displayed) Labs Reviewed  BASIC METABOLIC PANEL - Abnormal; Notable for the following components:      Result Value   CO2 21 (*)    Glucose, Bld 102 (*)    Calcium 8.5 (*)    All other components within normal limits  CBC - Abnormal; Notable for the following components:   RBC 3.67 (*)    Hemoglobin 9.7 (*)    HCT 31.5 (*)    All other components within normal limits  TROPONIN I (HIGH SENSITIVITY)     EKG  I, Nance Pear, attending physician, personally  viewed and interpreted this EKG  EKG Time: 0732 Rate: 156 Rhythm: atrial fibrillation with RVR Axis: normal Intervals: qtc 474 QRS: narrow ST changes: no st elevation Impression: abnormal ekg   RADIOLOGY I independently interpreted and visualized the CXR. My interpretation: No pneumonia. No pneumothorax.  Radiology interpretation: IMPRESSION:  No radiographic evidence of acute cardiopulmonary disease.     PROCEDURES:  Critical Care performed: Yes, see critical care procedure note(s)  Procedures  CRITICAL CARE Performed by: Nance Pear   Total critical care time: 30 minutes  Critical care time was exclusive of separately billable procedures and treating other patients.  Critical care was necessary to treat or prevent imminent or life-threatening deterioration.  Critical care was time spent personally by me on the following activities: development of treatment plan with patient and/or surrogate as well as nursing, discussions with consultants, evaluation of patient's response to treatment, examination of patient, obtaining history from patient or surrogate, ordering and performing treatments and  interventions, ordering and review of laboratory studies, ordering and review of radiographic studies, pulse oximetry and re-evaluation of patient's condition.    IMPRESSION / MDM / ASSESSMENT AND PLAN / ED COURSE  I reviewed the triage vital signs and the nursing notes.                              Differential diagnosis includes, but is not limited to, electrolyte abnormality, MI, heart failure.   Patient's presentation is most consistent with acute presentation with potential threat to life or bodily function.  Patient presented to the emergency department today because of concerns for chest pain for the past 2 days.  Initial EKG was notable for A-fib with RVR.  Patient denies any history of A-fib and I did not see any history of A-fib in her chart.  Initially tried giving  a bolus of diltiazem however that did not control heart rate.  Because of this she was written for a diltiazem infusion.  Blood work without obvious etiology for the afib. Discussed with Dr. Francine Graven with the hospitalist service who will plan on admission.  FINAL CLINICAL IMPRESSION(S) / ED DIAGNOSES   Final diagnoses:  Chest pain, unspecified type  Atrial fibrillation with RVR ([place])     Note:  This document was prepared using Dragon voice recognition software and may include unintentional dictation errors.    Nance Pear, MD 06/05/22 [phone]

## 2022-06-05 NOTE — ED Notes (Signed)
Pt eating lunch at this time

## 2022-06-05 NOTE — Assessment & Plan Note (Signed)
Patient has anemia of chronic disease but noted to have an almost 2 g drop in her H&H She denies having any hematochezia or passage of melena stools and denies NSAID use She is on aspirin and Plavix and currently started on heparin drip We will monitor H&H closely

## 2022-06-05 NOTE — ED Notes (Signed)
Pt eating dinner tray °

## 2022-06-05 NOTE — ED Triage Notes (Signed)
Pt to ED [facility] from home for chest pain and shob that started Sunday. Pt with HR 180s on arrival, 324 asa, 400 ml NS, 2.5 mg metoprolol given PTA ems. 20 g to left FA.  Hx STEMI Son died this week

## 2022-06-05 NOTE — Assessment & Plan Note (Signed)
Continue lisinopril for blood pressure control

## 2022-06-05 NOTE — Assessment & Plan Note (Signed)
New onset Patient presents for evaluation of chest pain and was found to be in rapid A-fib. Started on Cardizem drip in the ER which will be continued Patient has a CHA2DS2-VASc score of 4 and ideally requires long-term anticoagulation as secondary prophylaxis for an acute stroke Continue heparin drip initiated in the ER Obtain 2D echocardiogram to assess LVEF Consult cardiology

## 2022-06-05 NOTE — ED Notes (Addendum)
Dr Johnna Acosta notified of pt maxed out on cardizem at '15mg'$  with HR 125-155. No new orders at this time.

## 2022-06-05 NOTE — ED Notes (Signed)
Pt had incontinent episode x 2, pt states she has purewick in place but is not working, suction turned on at this time, new brief provided and purewick replaced.

## 2022-06-06 ENCOUNTER — Inpatient Hospital Stay
Admit: 2022-06-06 | Discharge: 2022-06-06 | Disposition: A | Discharge status: Home / Self Care | Payer: Medicare Other | Attending: Cardiology | Admitting: Cardiology

## 2022-06-06 DIAGNOSIS — E876 Hypokalemia: Secondary | ICD-10-CM

## 2022-06-06 DIAGNOSIS — I10 Essential (primary) hypertension: Secondary | ICD-10-CM

## 2022-06-06 DIAGNOSIS — I4891 Unspecified atrial fibrillation: Secondary | ICD-10-CM | POA: Diagnosis not present

## 2022-06-06 LAB — BASIC METABOLIC PANEL
Anion gap: 7 (ref 5–15)
BUN: 12 mg/dL (ref 8–23)
CO2: 25 mmol/L (ref 22–32)
Calcium: 8.3 mg/dL — ABNORMAL LOW (ref 8.9–10.3)
Chloride: 109 mmol/L (ref 98–111)
Creatinine, Ser: 0.68 mg/dL (ref 0.44–1.00)
GFR, Estimated: >60 mL/min (ref >60)
Glucose, Bld: 130 mg/dL — ABNORMAL HIGH (ref 70–99)
Potassium: 3.2 mmol/L — ABNORMAL LOW (ref 3.5–5.1)
Sodium: 141 mmol/L (ref 135–145)

## 2022-06-06 LAB — ECHOCARDIOGRAM COMPLETE
AR max vel: 1.52 cm2
AV Area VTI: 1.48 cm2
AV Area mean vel: 1.52 cm2
AV Mean grad: 4 mmHg
AV Peak grad: 6.6 mmHg
Ao pk vel: 1.28 m/s
Area-P 1/2: 3.45 cm2
Height: 61 in
S' Lateral: 2.32 cm
Weight: 2165.8 oz

## 2022-06-06 LAB — CBC
HCT: 27.9 % — ABNORMAL LOW (ref 36.0–46.0)
Hemoglobin: 8.8 g/dL — ABNORMAL LOW (ref 12.0–15.0)
MCH: 26.5 pg (ref 26.0–34.0)
MCHC: 31.5 g/dL (ref 30.0–36.0)
MCV: 84 fL (ref 80.0–100.0)
Platelets: 334 10*3/uL (ref 150–400)
RBC: 3.32 MIL/uL — ABNORMAL LOW (ref 3.87–5.11)
RDW: 14.3 % (ref 11.5–15.5)
WBC: 8.3 10*3/uL (ref 4.0–10.5)
nRBC: 0 % (ref 0.0–0.2)

## 2022-06-06 LAB — GLUCOSE, CAPILLARY
Glucose-Capillary: 160 mg/dL — ABNORMAL HIGH (ref 70–99)
Glucose-Capillary: 153 mg/dL — ABNORMAL HIGH (ref 70–99)
Glucose-Capillary: 96 mg/dL (ref 70–99)
Glucose-Capillary: 140 mg/dL — ABNORMAL HIGH (ref 70–99)

## 2022-06-06 LAB — TROPONIN I (HIGH SENSITIVITY): Troponin I (High Sensitivity): 280 ng/L (ref <18)

## 2022-06-06 LAB — HEPARIN LEVEL (UNFRACTIONATED): Heparin Unfractionated: 0.65 IU/mL (ref 0.30–0.70)

## 2022-06-06 LAB — T4, FREE: Free T4: 1.13 ng/dL — ABNORMAL HIGH (ref 0.61–1.12)

## 2022-06-06 MED ORDER — POTASSIUM CHLORIDE 20 MEQ PO PACK
40.0000 meq | PACK | Freq: Once | ORAL | Status: AC
  Administered 2022-06-06: 40 meq via ORAL
  Filled 2022-06-06: qty 2

## 2022-06-06 MED ORDER — APIXABAN 5 MG PO TABS
5.0000 mg | ORAL_TABLET | Freq: Two times a day (BID) | ORAL | Status: DC
  Administered 2022-06-07 (×2): 5 mg via ORAL
  Filled 2022-06-06 (×2): qty 1

## 2022-06-06 NOTE — Progress Notes (Signed)
*  PRELIMINARY RESULTS* Echocardiogram 2D Echocardiogram has been performed.  Madison Davidson [name] 06/06/2022, 10:37 AM

## 2022-06-06 NOTE — Progress Notes (Signed)
PROGRESS NOTE    Madison Davidson  CVE:[id number] DOB: [birth year] DOA: 06/05/2022 [facility]CP: [facility]   Assessment & Plan:   Principal Problem:   Atrial fibrillation with rapid ventricular response ([place]) Active Problems:   History of CVA (cerebrovascular accident)   Hypertension   Anemia of chronic disease   Elevated troponin  Assessment and Plan: A. fib: w/ RVR. New onset. Continue on amiodarone '200mg'$  BID x 5 days then '200mg'$  daily thereafter, eliquis. D/c IV hepain & IV diltiazem. Echo is pending    Hx of CVA: s/p IR stenting. Continue on plavix, eliquis & statin  Continue aspirin, Plavix and atorvastatin   HTN: continue on lisinopril, amio    Elevated troponin: likely secondary to demand ischemia from a. fib w/ RVR   ACD: denies any bloody or black stools and denies NSAID use. Was aspirin, plavix at home. D/c IV heparin drip. Monitor H&H.   Hypokalemia: potassium given     DVT prophylaxis: eliquis  Code Status: full  Family Communication:  Disposition Plan: depends on PT/OT recs   Level of care: Progressive  Status is: Inpatient Remains inpatient appropriate because: severity of illness    Consultants:  Cardio   Procedures:   Antimicrobials:    Subjective: Pt c/o fatigue  Objective: Vitals:   06/05/22 2144 06/05/22 2307 06/06/22 0438 06/06/22 0808  BP: (!) 153/77 (!) 155/48 (!) 155/55 (!) 154/42  Pulse: 67 (!) 59  (!) 57  Resp: '15 19 18 17  '$ Temp: 98.5 F (36.9 C) 98.2 F (36.8 C) 97.6 F (36.4 C) 98.5 F (36.9 C)  TempSrc: Oral Oral Oral   SpO2: 100% 99% 99% 98%  Weight: 61.4 kg     Height: '5\' 1"'$  (1.549 m)       Intake/Output Summary (Last 24 hours) at 06/06/2022 0851 Last data filed at 06/06/2022 0600 Gross per 24 hour  Intake 695.75 ml  Output 400 ml  Net 295.75 ml   Filed Weights   06/05/22 0735 06/05/22 2144  Weight: 81.6 kg 61.4 kg    Examination:  General exam: Appears calm and comfortable  Respiratory  system: Clear to auscultation. Respiratory effort normal. Cardiovascular system: S1 & S2+. No , rubs, gallops or clicks.  Gastrointestinal system: Abdomen is nondistended, soft and nontender. Normal bowel sounds heard. Central nervous system: Alert and oriented. Moves all extremities  Psychiatry: Judgement and insight appear normal. Flat mood and affect     Data Reviewed: I have personally reviewed following labs and imaging studies  CBC: Recent Labs  Lab 06/05/22 0742 06/06/22 0404  WBC 9.0 8.3  HGB 9.7* 8.8*  HCT 31.5* 27.9*  MCV 85.8 84.0  PLT 376 025   Basic Metabolic Panel: Recent Labs  Lab 06/05/22 0742 06/06/22 0404  NA 139 141  K 3.6 3.2*  CL 109 109  CO2 21* 25  GLUCOSE 102* 130*  BUN 13 12  CREATININE 0.62 0.68  CALCIUM 8.5* 8.3*   GFR: Estimated Creatinine Clearance: 53.4 mL/min (by C-G formula based on SCr of 0.68 mg/dL). Liver Function Tests: No results for input(s): "AST", "ALT", "ALKPHOS", "BILITOT", "PROT", "ALBUMIN" in the last 168 hours. No results for input(s): "LIPASE", "AMYLASE" in the last 168 hours. No results for input(s): "AMMONIA" in the last 168 hours. Coagulation Profile: Recent Labs  Lab 06/05/22 0742  INR 1.1   Cardiac Enzymes: No results for input(s): "CKTOTAL", "CKMB", "CKMBINDEX", "TROPONINI" in the last 168 hours. BNP (last 3 results) No results for input(s): "PROBNP"  in the last 8760 hours. HbA1C: No results for input(s): "HGBA1C" in the last 72 hours. CBG: Recent Labs  Lab 06/05/22 1147 06/05/22 1617 06/05/22 2144 06/06/22 0809  GLUCAP 109* 111* 99 160*   Lipid Profile: No results for input(s): "CHOL", "HDL", "LDLCALC", "TRIG", "CHOLHDL", "LDLDIRECT" in the last 72 hours. Thyroid Function Tests: Recent Labs    06/05/22 1225 06/06/22 0404  TSH 0.149*  --   FREET4  --  1.13*   Anemia Panel: No results for input(s): "VITAMINB12", "FOLATE", "FERRITIN", "TIBC", "IRON", "RETICCTPCT" in the last 72 hours. Sepsis  Labs: No results for input(s): "PROCALCITON", "LATICACIDVEN" in the last 168 hours.  No results found for this or any previous visit (from the past 240 hour(s)).       Radiology Studies: DG Chest Port 1 View  Result Date: 06/05/2022 CLINICAL DATA:  71 year old female with history of chest pain and shortness of breath. EXAM: PORTABLE CHEST 1 VIEW COMPARISON:  Chest x-ray 01/24/2022. FINDINGS: Lung volumes are normal. No consolidative airspace disease. No pleural effusions. No pneumothorax. No pulmonary nodule or mass noted. Pulmonary vasculature and the cardiomediastinal silhouette are within normal limits. IMPRESSION: No radiographic evidence of acute cardiopulmonary disease. Electronically Signed   By: Vinnie Langton M.D.   On: 06/05/2022 07:58        Scheduled Meds:  amiodarone  200 mg Oral BID   Followed by   Derrill Memo ON 06/10/2022] amiodarone  200 mg Oral Daily   atorvastatin  80 mg Oral QHS   clopidogrel  75 mg Oral Daily   insulin aspart  0-15 Units Subcutaneous TID WC   levothyroxine  112 mcg Oral Daily   lisinopril  20 mg Oral Daily   melatonin  5 mg Oral QHS   polycarbophil  625 mg Oral BID   potassium chloride  40 mEq Oral Once   Continuous Infusions:  diltiazem (CARDIZEM) infusion Stopped (06/05/22 1010)   heparin 1,000 Units/hr (06/06/22 0436)     LOS: 1 day    Time spent: 35 mins     Wyvonnia Dusky, MD [facility] Pager [phone]  If 7PM-7AM, please contact night-coverage [email or web address] 06/06/2022, 8:51 AM

## 2022-06-06 NOTE — Progress Notes (Signed)
ANTICOAGULATION CONSULT NOTE   Pharmacy Consult for heparin drip Indication: atrial fibrillation  No Known Allergies  Patient Measurements: Height: '5\' 1"'$  (154.9 cm) Weight: 61.4 kg (135 lb 5.8 oz) IBW/kg (Calculated) : 47.8 Heparin Dosing Weight: 66 kg  Vital Signs: Temp: 97.6 F (36.4 C) (07/12 0438) Temp Source: Oral (07/12 0438) BP: 155/55 (07/12 0438) Pulse Rate: 59 (07/11 2307)  Labs: Recent Labs    06/05/22 0742 06/05/22 0951 06/05/22 1921 06/05/22 2148 06/05/22 2350 06/06/22 0404  HGB 9.7*  --   --   --   --  8.8*  HCT 31.5*  --   --   --   --  27.9*  PLT 376  --   --   --   --  334  APTT 29  --   --   --   --   --   LABPROT 13.6  --   --   --   --   --   INR 1.1  --   --   --   --   --   HEPARINUNFRC  --   --  0.58  --   --  0.65  CREATININE 0.62  --   --   --   --  0.68  TROPONINIHS 8   < > TEST REQUEST RECEIVED WITHOUT APPROPRIATE SPECIMEN 337* 280*  --    < > = values in this interval not displayed.     Estimated Creatinine Clearance: 53.4 mL/min (by C-G formula based on SCr of 0.68 mg/dL).   Medical History: Past Medical History:  Diagnosis Date   Cancer ([place])    skin   Coronary artery disease    Diabetes mellitus without complication ([place])    Hypertension    Stroke ([place])     Medications:  Scheduled:   amiodarone  200 mg Oral BID   Followed by   Derrill Memo ON 06/10/2022] amiodarone  200 mg Oral Daily   apixaban  5 mg Oral BID   atorvastatin  80 mg Oral QHS   clopidogrel  75 mg Oral Daily   insulin aspart  0-15 Units Subcutaneous TID WC   levothyroxine  112 mcg Oral Daily   lisinopril  20 mg Oral Daily   melatonin  5 mg Oral QHS   polycarbophil  625 mg Oral BID   Infusions:   diltiazem (CARDIZEM) infusion Stopped (06/05/22 1010)   heparin 1,000 Units/hr (06/06/22 0436)    Assessment: 71 yo F to start heparin drip for Afib. Hx STEMI Hgb 9.7  Plt 376.    7/11 1921 HL 0.58     Goal of Therapy:  Heparin level 0.3-0.7  units/ml Monitor platelets by anticoagulation protocol: Yes   Plan:  Heparin level is therapeutic. Will continue heparin infusion at 1000 units/hr. Will recheck HL/CBC w/ AM labs if start of apixaban in the AM per cards, is delayed and heparin infusion not stopped.   Renda Rolls, PharmD, [facility] 06/06/2022 5:21 AM

## 2022-06-06 NOTE — Progress Notes (Addendum)
[facility] CONSULT NOTE       Patient ID: Madison Davidson MRN: [id number] DOB/AGE: [birth year] 71 y.o.  Admit date: 06/05/2022 Referring Physician Dr. Francine Graven Primary Physician  Primary Cardiologist Nehemiah Massed Reason for Consultation new onset AF RVR  HPI: Makayli Bracken is a 2yoF with a PMH of hypertension, hyperlipidemia type 2 diabetes, CVA, obesity, hypothyroidism who presented to [facility] ED 06/05/22 with chest pain and was found to be in atrial fibrillation with RVR. Cardiology is consulted for further assistance.   Interval History: - feels well, no further chest pain  - remained in sinus rhythm overnight - Hgb decreasing from 9.7 to 8.8 overnight, denies hematochezia, melena, daughter at bedside today says the patient was recently prescribed iron for IDA by her PCP and was also instructed to hold her levothyroxine  -echo performed this morning, pending read   Review of systems complete and found to be negative unless listed above     Past Medical History:  Diagnosis Date   Cancer ([place])    skin   Coronary artery disease    Diabetes mellitus without complication ([place])    Hypertension    Stroke [facility])     Past Surgical History:  Procedure Laterality Date   ABDOMINAL HYSTERECTOMY     IR ANGIO INTRA EXTRACRAN SEL INTERNAL CAROTID BILAT MOD SED  05/11/2021   IR ANGIO VERTEBRAL SEL VERTEBRAL UNI R MOD SED  05/11/2021   IR ANGIOGRAM EXTREMITY LEFT  05/11/2021   IR CT HEAD LTD  05/11/2021   IR INTRA CRAN STENT  05/11/2021   IR INTRA CRAN STENT  05/12/2021   IR US GUIDE VASC ACCESS RIGHT  05/11/2021   RADIOLOGY WITH ANESTHESIA N/A 05/11/2021   Procedure: IR WITH ANESTHESIA - INTRACRANIAL STENT;  Surgeon: Pedro Earls, MD;  Location: [place];  Service: Radiology;  Laterality: N/A;    Medications Prior to Admission  Medication Sig Dispense Refill Last Dose   acetaminophen (TYLENOL) 325 MG tablet Take 2 tablets (650 mg total) by mouth every 6 (six) hours as  needed for mild pain, fever or headache.   PRN   atorvastatin (LIPITOR) 20 MG tablet Take 4 tablets (80 mg total) by mouth at bedtime. (Patient taking differently: Take 40 mg by mouth in the morning and at bedtime. 2qaM 2HS) 320 tablet 0 06/04/2022   cholecalciferol (VITAMIN D3) 25 MCG (1000 UNIT) tablet Take 1,000 Units by mouth daily.   06/04/2022   clopidogrel (PLAVIX) 75 MG tablet Take 75 mg by mouth daily.   06/04/2022   ketoconazole (NIZORAL) 2 % cream 1 Application daily. APPLY TO BUTTOCKS      levothyroxine (SYNTHROID) 75 MCG tablet Take 75 mcg by mouth daily.   06/04/2022   lisinopril (ZESTRIL) 20 MG tablet Take 1 tablet (20 mg total) by mouth daily. 30 tablet 0 06/04/2022   melatonin 3 MG TABS tablet Take 3 mg by mouth at bedtime.   06/04/2022   metFORMIN (GLUCOPHAGE) 1000 MG tablet Take 1 tablet (1,000 mg total) by mouth 2 (two) times daily with a meal. 60 tablet 0 06/04/2022   omeprazole (PRILOSEC) 20 MG capsule Take 20 mg by mouth daily.   06/04/2022   acidophilus (RISAQUAD) CAPS capsule Take 2 capsules by mouth 3 (three) times daily. (Patient not taking: Reported on 06/05/2022) 90 capsule 0 Not Taking   aspirin EC 81 MG EC tablet Take 1 tablet (81 mg total) by mouth daily. Swallow whole. (Patient not taking: Reported on 06/05/2022) 30  tablet 11 Not Taking   mupirocin ointment (BACTROBAN) 2 % SMARTSIG:1 Application Topical 2-3 Times Daily      polycarbophil (FIBERCON) 625 MG tablet Take 1 tablet (625 mg total) by mouth 2 (two) times daily. 60 tablet 0    vitamin B-12 (CYANOCOBALAMIN) 500 MCG tablet Take 1 tablet (500 mcg total) by mouth daily. (Patient not taking: Reported on 06/05/2022) 30 tablet 3 Not Taking    Social History   Socioeconomic History   Marital status: Divorced    Spouse name: Not on file   Number of children: Not on file   Years of education: Not on file   Highest education level: Not on file  Occupational History   Not on file  Tobacco Use   Smoking status: Never    Smokeless tobacco: Never  Substance and Sexual Activity   Alcohol use: No   Drug use: No   Sexual activity: Not on file  Other Topics Concern   Not on file  Social History Narrative   Not on file   Social Determinants of Health   Financial Resource Strain: Not on file  Food Insecurity: Not on file  Transportation Needs: Not on file  Physical Activity: Not on file  Stress: Not on file  Social Connections: Not on file  Intimate Partner Violence: Not on file    Family History  Problem Relation Age of Onset   Heart disease Mother    Heart disease Father       PHYSICAL EXAM General: Pleasant elderly Caucasian female, well nourished, in no acute distress. HEENT:  Normocephalic and atraumatic. Neck:  No JVD.  Lungs: Normal respiratory effort on room air. Clear bilaterally to auscultation. No wheezes, crackles, rhonchi.  Heart: HRRR . Normal S1 and S2 without gallops or murmurs. Radial & DP pulses 2+ bilaterally. Abdomen: Obese appearing.  Msk: Normal strength and tone for age. Extremities: Warm and well perfused. No clubbing, cyanosis.  No peripheral edema.  Neuro: Alert and oriented X 3. Psych: Flat affect. answers questions appropriately.   Labs:   Lab Results  Component Value Date   WBC 8.3 06/06/2022   HGB 8.8 (L) 06/06/2022   HCT 27.9 (L) 06/06/2022   MCV 84.0 06/06/2022   PLT 334 06/06/2022    Recent Labs  Lab 06/06/22 0404  NA 141  K 3.2*  CL 109  CO2 25  BUN 12  CREATININE 0.68  CALCIUM 8.3*  GLUCOSE 130*    No results found for: "CKTOTAL", "CKMB", "CKMBINDEX", "TROPONINI"  Lab Results  Component Value Date   CHOL 198 05/09/2021   Lab Results  Component Value Date   HDL 35 (L) 05/09/2021   Lab Results  Component Value Date   LDLCALC 119 (H) 05/09/2021   Lab Results  Component Value Date   TRIG 220 (H) 05/09/2021   Lab Results  Component Value Date   CHOLHDL 5.7 05/09/2021   No results found for: "LDLDIRECT"    Radiology: [facility] Chest  Port 1 View  Result Date: 06/05/2022 CLINICAL DATA:  71 year old female with history of chest pain and shortness of breath. EXAM: PORTABLE CHEST 1 VIEW COMPARISON:  Chest x-ray 01/24/2022. FINDINGS: Lung volumes are normal. No consolidative airspace disease. No pleural effusions. No pneumothorax. No pulmonary nodule or mass noted. Pulmonary vasculature and the cardiomediastinal silhouette are within normal limits. IMPRESSION: No radiographic evidence of acute cardiopulmonary disease. Electronically Signed   By: Vinnie Langton M.D.   On: 06/05/2022 07:58    ECHO 02/2020  ECHOCARDIOGRAPHIC MEASUREMENTS  2D DIMENSIONS  AORTA                  Values   Normal Range   MAIN PA         Values    Normal Range                Annulus: nm*          [2.1-2.5]         PA Main: nm*       [1.5-2.1]              Aorta Sin: 3.0 cm       [2.7-3.3]    RIGHT VENTRICLE            ST Junction: nm*          [2.3-2.9]         RV Base: nm*       [<4.2]              Asc.Aorta: nm*          [2.3-3.1]          RV Mid: nm*       [<3.5]  LEFT VENTRICLE                                      RV Length: nm*       [<8.6]                  LVIDd: 4.8 cm       [3.9-5.3]    INFERIOR VENA CAVA                  LVIDs: 3.2 cm                        Max. IVC: nm*       [<=2.1]                     FS: 32.9 %       [>25]            Min. IVC: nm*                    SWT: 0.84 cm      [0.5-0.9]    ------------------                    PWT: 1.0 cm       [0.5-0.9]    nm* - not measured  LEFT ATRIUM                LA Diam: 3.4 cm       [2.7-3.8]            LA A4C Area: nm*          [<20]              LA Volume: nm*          [22-52]  _________________________________________________________________________________________  ECHOCARDIOGRAPHIC DESCRIPTIONS  AORTIC ROOT                   Size: Normal             Dissection: INDETERM FOR DISSECTION  AORTIC VALVE               Leaflets: Tricuspid  Morphology: Normal                Mobility: Fully mobile  LEFT VENTRICLE                   Size: Normal                        Anterior: Normal            Contraction: Normal                         Lateral: Normal             Closest EF: >55% (Estimated)                Septal: Normal              LV Masses: No Masses                       Apical: Normal                    LVH: None                          Inferior: Normal                                                      Posterior: Normal           Dias.FxClass: (Grade 1) relaxation abnormal, E/A reversal  MITRAL VALVE               Leaflets: Normal                        Mobility: Fully mobile             Morphology: Normal  LEFT ATRIUM                   Size: Normal                       LA Masses: No masses              IA Septum: Normal IAS  MAIN PA                   Size: Normal  PULMONIC VALVE             Morphology: Normal                        Mobility: Fully mobile  RIGHT VENTRICLE              RV Masses: No Masses                         Size: Normal              Free Wall: Normal                     Contraction: Normal  TRICUSPID VALVE               Leaflets: Normal  Mobility: Fully mobile             Morphology: Normal  RIGHT ATRIUM                   Size: Normal                        RA Other: None                RA Mass: No masses  PERICARDIUM                  Fluid: No effusion  INFERIOR VENACAVA                   Size: Normal Normal respiratory collapse  _________________________________________________________________________________________   DOPPLER ECHO and OTHER SPECIAL PROCEDURES                 Aortic: TRIVIAL AR                 No AS                         135.2 cm/sec peak vel      7.3 mmHg peak grad                         3.5 mmHg mean grad         2.5 cm^2 by DOPPLER                 Mitral: MILD MR                    No MS                         MV Inflow E Vel = 115.0 cm/sec      MV Annulus E'Vel =  4.1 cm/sec                         E/E'Ratio = 28.0              Tricuspid: TRIVIAL TR                 No TS              Pulmonary: TRIVIAL PR                 No PS  _________________________________________________________________________________________  INTERPRETATION  NORMAL LEFT VENTRICULAR SYSTOLIC FUNCTION WITH AN ESTIMATED EF = >55 %  NORMAL RIGHT VENTRICULAR SYSTOLIC FUNCTION  MILD MITRAL VALVE INSUFFICIENCY  TRACE TRICUSPID AND AORTIC VALVE INSUFFICIENCY  NO VALVULAR STENOSIS   TELEMETRY reviewed by me: Initially atrial fibrillation with rates peaking in the 160s, converted to sinus bradycardia to sinus rhythm with rates 57-low 60s   EKG reviewed by me: AF RVR rate 156, repeat NSR rate  ASSESSMENT AND PLAN:  Madison Davidson is a 46yoF with a PMH of hypertension, hyperlipidemia type 2 diabetes, CVA, hypothyroidism, obesity who presented to [facility] ED 06/05/22 with chest pain and was found to be in atrial fibrillation with RVR. Cardiology is consulted for further assistance.   #New onset paroxysmal atrial fibrillation with RVR, converted to NSR  #hypothyroidism In the setting of recent loss of her son due to an MI last week, started feeling left-sided constant chest discomfort on Sunday, not relieved or  exacerbated by anything in particular.  Found to be in A-fib with RVR with rates in the 160s on admission, new in onset.  She was started on a diltiazem drip for 3 hours, and after amiodarone bolus she converted to sinus rhythm and her chest discomfort resolved.  Her troponin trended [phone], likely demand in the setting of rapid heart rate.  She remained in sinus rhythm overnight without further chest discomfort. -Discontinue diltiazem gtt. -S/p amiodarone bolus with conversion to NSR -continue load with amiodarone 200 mg p.o. twice daily x5 days, then 200 mg daily thereafter -s/p heparin drip. Drop in Hgb to 8.8 today from 9.7 overnight, was started on an iron supplement recently  by her PCP per daughter and is only on aspirin. Her CHA2DS2-VASc 6 so risk of stroke is high.  Would recommend holding anticoagulation today, and starting Eliquis 5 mg twice daily tomorrow in addition to her aspirin, recheck of hemoglobin next week. -Ordered echocardiogram complete, performed this morning, pending read -If he continues to feel well, she is okay for discharge later today from a cardiac standpoint, will need close follow-up with Dr. Nehemiah Massed in 1 week, in addition to her PCP  This patient's plan of care was discussed and created with Dr. Nehemiah Massed and he is in agreement.  Signed: Tristan Schroeder , PA-C 06/06/2022, 10:06 AM [facility]  The patient has had significant improvements of symptoms since conversion to normal sinus rhythm on day 1.  The patient has not had any issues of acute coronary syndrome and/or congestive heart failure.  She is ambulating well on current medical regimen including low-dose amiodarone and now additional Eliquis for treatment.  If ambulating well she is able for discharge to home  The patient has been interviewed and examined. I agree with assessment and plan above. Serafina Royals MD [facility]

## 2022-06-06 NOTE — Plan of Care (Signed)
  Problem: Health Behavior/Discharge Planning: Goal: Ability to manage health-related needs will improve Outcome: Progressing   Problem: Clinical Measurements: Goal: Respiratory complications will improve Outcome: Progressing   Problem: Clinical Measurements: Goal: Cardiovascular complication will be avoided Outcome: Progressing   Problem: Nutrition: Goal: Adequate nutrition will be maintained Outcome: Progressing   Problem: Pain Managment: Goal: General experience of comfort will improve Outcome: Progressing   Problem: Safety: Goal: Ability to remain free from injury will improve Outcome: Progressing

## 2022-06-07 DIAGNOSIS — I10 Essential (primary) hypertension: Secondary | ICD-10-CM | POA: Diagnosis not present

## 2022-06-07 DIAGNOSIS — D638 Anemia in other chronic diseases classified elsewhere: Secondary | ICD-10-CM | POA: Diagnosis not present

## 2022-06-07 DIAGNOSIS — I4891 Unspecified atrial fibrillation: Secondary | ICD-10-CM | POA: Diagnosis not present

## 2022-06-07 LAB — CBC
HCT: 30.5 % — ABNORMAL LOW (ref 36.0–46.0)
Hemoglobin: 9.8 g/dL — ABNORMAL LOW (ref 12.0–15.0)
MCH: 27.1 pg (ref 26.0–34.0)
MCHC: 32.1 g/dL (ref 30.0–36.0)
MCV: 84.5 fL (ref 80.0–100.0)
Platelets: 335 10*3/uL (ref 150–400)
RBC: 3.61 MIL/uL — ABNORMAL LOW (ref 3.87–5.11)
RDW: 14.3 % (ref 11.5–15.5)
WBC: 8.3 10*3/uL (ref 4.0–10.5)
nRBC: 0 % (ref 0.0–0.2)

## 2022-06-07 LAB — FERRITIN: Ferritin: 15 ng/mL (ref 11–307)

## 2022-06-07 LAB — BASIC METABOLIC PANEL
Anion gap: 6 (ref 5–15)
BUN: 8 mg/dL (ref 8–23)
CO2: 26 mmol/L (ref 22–32)
Calcium: 8.6 mg/dL — ABNORMAL LOW (ref 8.9–10.3)
Chloride: 108 mmol/L (ref 98–111)
Creatinine, Ser: 0.75 mg/dL (ref 0.44–1.00)
GFR, Estimated: >60 mL/min (ref >60)
Glucose, Bld: 141 mg/dL — ABNORMAL HIGH (ref 70–99)
Potassium: 3.6 mmol/L (ref 3.5–5.1)
Sodium: 140 mmol/L (ref 135–145)

## 2022-06-07 LAB — GLUCOSE, CAPILLARY
Glucose-Capillary: 137 mg/dL — ABNORMAL HIGH (ref 70–99)
Glucose-Capillary: 128 mg/dL — ABNORMAL HIGH (ref 70–99)
Glucose-Capillary: 154 mg/dL — ABNORMAL HIGH (ref 70–99)

## 2022-06-07 LAB — IRON AND TIBC
Iron: 35 ug/dL (ref 28–170)
Saturation Ratios: 11 % (ref 10.4–31.8)
TIBC: 312 ug/dL (ref 250–450)
UIBC: 277 ug/dL

## 2022-06-07 MED ORDER — AMIODARONE HCL 200 MG PO TABS: 200.0000 mg | ORAL_TABLET | Freq: Two times a day (BID) | ORAL | 0 refills | Status: DC

## 2022-06-07 MED ORDER — AMIODARONE HCL 200 MG PO TABS
200.0000 mg | ORAL_TABLET | Freq: Two times a day (BID) | ORAL | Status: DC
  Administered 2022-06-07: 200 mg via ORAL
  Filled 2022-06-07: qty 1

## 2022-06-07 MED ORDER — AMIODARONE HCL 200 MG PO TABS: 200.0000 mg | ORAL_TABLET | Freq: Every day | ORAL | Status: DC

## 2022-06-07 MED ORDER — AMIODARONE HCL 200 MG PO TABS: 200.0000 mg | ORAL_TABLET | Freq: Every day | ORAL | 0 refills | Status: DC

## 2022-06-07 MED ORDER — APIXABAN 5 MG PO TABS: 5.0000 mg | ORAL_TABLET | Freq: Two times a day (BID) | ORAL | 0 refills | Status: DC

## 2022-06-07 NOTE — TOC CM/SW Note (Signed)
[facility] REFERRAL        Occupational Therapy * Physical Therapy * Speech Therapy                           DATE 7/13/2023___       PATIENT NAME____[name]___  PATIENT EGB__[id number]__       DIAGNOSIS/DIAGNOSIS CODE __I48.91, Z86.73_____ DATE OF DISCHARGE: __7/13/2023__       PRIMARY CARE PHYSICIAN __[facility]__    PCP PHONE/FAX___[phone]__      Dear Provider (Name: ___[facility]___  Fax: __[phone]___):   I certify that I have examined this patient and that occupational/physical/speech therapy is necessary on an outpatient basis.    The patient has expressed interest in completing their recommended course of therapy at your  location.  Once a formal order from the patient's primary care physician has been obtained, please  contact him/her to schedule an appointment for evaluation at your earliest convenience.   [ x ]  Physical Therapy Evaluate and Treat          [  ]  Occupational Therapy Evaluate and Treat                                                             [  ]  Speech Therapy Evaluate and Treat         The patient's primary care physician (listed above) must furnish and be responsible for a formal order such that the recommended services may be furnished while under the primary physician's care, and that the plan of care will be established and reviewed every 30 days (or more often if condition necessitates).

## 2022-06-07 NOTE — Evaluation (Signed)
Occupational Therapy Evaluation Patient Details Name: Madison Davidson MRN: [id number] DOB: [birth year] Today's Date: 06/07/2022   History of Present Illness Madison Davidson is a 71 y.o. female with medical history significant for diabetes mellitus, hypertension, coronary artery disease who presents to the ER for evaluation of a 2-day history of chest pain that woke her up out of her sleep. Pt was found to be in atrial fibrillation with RVR and admitted for fruther management. Cardiology is following.   Clinical Impression   Madison Davidson was seen for OT evaluation this date. Prior to hospital admission, pt was independent in all aspects of ADL management and denies falls history in past 6 months. Pt lives alone in a 1 level apartment with family nearby able to provide PRN assist. Pt also endorses having a PCA who comes daily to assist with cleaning. Currently pt reports feeling at or near baseline level of functional independence. Pt demonstrates baseline independence to perform ADL and mobility tasks and no strength, sensory, coordination, cognitive, or visual deficits appreciated with assessment. She performs functional mobility in room without AE. Pt educated on falls prevention strategies and energy conservation techniques to maximize safety and independence upon hospital DC. No further skilled OT needs identified. Will sign off. Please re-consult if additional OT needs arise.       Recommendations for follow up therapy are one component of a multi-disciplinary discharge planning process, led by the attending physician.  Recommendations may be updated based on patient status, additional functional criteria and insurance authorization.   Follow Up Recommendations  No OT follow up    Assistance Recommended at Discharge PRN  Patient can return home with the following      Functional Status Assessment  Patient has not had a recent decline in their functional status  Equipment  Recommendations  None recommended by OT    Recommendations for Other Services       Precautions / Restrictions Precautions Precautions: Fall Restrictions Weight Bearing Restrictions: No      Mobility Bed Mobility Overal bed mobility: Independent                  Transfers Overall transfer level: Independent                 General transfer comment: Pt endorses mild dizzness with positional changes but this resolves with therapeutic rest break.      Balance Overall balance assessment: No apparent balance deficits (not formally assessed)                                         ADL either performed or assessed with clinical judgement   ADL Overall ADL's : At baseline                                     Functional mobility during ADLs: Modified independent General ADL Comments: Pt performs bed/functional mobility at baseline level. She is able to demonstrate functional reach to BLE with no balance or sensory deficits appreciated during session. She presents at or near baseline level of functional independence.     Vision Baseline Vision/History: 0 No visual deficits Patient Visual Report: No change from baseline       Perception     Praxis      Pertinent Vitals/Pain Pain Assessment Pain  Assessment: No/denies pain     Hand Dominance Right   Extremity/Trunk Assessment Upper Extremity Assessment Upper Extremity Assessment: Overall WFL for tasks assessed   Lower Extremity Assessment Lower Extremity Assessment: Overall WFL for tasks assessed   Cervical / Trunk Assessment Cervical / Trunk Assessment: Normal   Communication Communication Communication: No difficulties   Cognition Arousal/Alertness: Awake/alert Behavior During Therapy: WFL for tasks assessed/performed Overall Cognitive Status: Within Functional Limits for tasks assessed                                       General Comments  Pt  sustains HR in mid 70's t/o session.    Exercises Other Exercises Other Exercises: Pt educted on role of OT in acute setting, falls prevention strategies, and routines modifications to support safety and functional independence during ADL management.   Shoulder Instructions      Home Living Family/patient expects to be discharged to:: Private residence Living Arrangements: Alone Available Help at Discharge: Family;Available PRN/intermittently Type of Home: Apartment Home Access: Level entry     Home Layout: One level     Bathroom Shower/Tub: Teacher, early years/pre: Standard     Home Equipment: Shower seat          Prior Functioning/Environment Prior Level of Function : Independent/Modified Independent             Mobility Comments: Denies falls history or use of AE ADLs Comments: Daughters assist with driving, otherwise pt states she is totally independent with ADL management. Has an aid who assists with all house cleaning needs.        OT Problem List: Cardiopulmonary status limiting activity;Decreased safety awareness;Decreased activity tolerance;Impaired balance (sitting and/or standing);Decreased knowledge of use of DME or AE      OT Treatment/Interventions:      OT Goals(Current goals can be found in the care plan section) Acute Rehab OT Goals Patient Stated Goal: To go home OT Goal Formulation: All assessment and education complete, DC therapy Time For Goal Achievement: 06/07/22 Potential to Achieve Goals: Good  OT Frequency:      Co-evaluation              AM-PAC OT "6 Clicks" Daily Activity     Outcome Measure Help from another person eating meals?: None Help from another person taking care of personal grooming?: None Help from another person toileting, which includes using toliet, bedpan, or urinal?: None Help from another person bathing (including washing, rinsing, drying)?: None Help from another person to put on and taking off  regular upper body clothing?: None Help from another person to put on and taking off regular lower body clothing?: None 6 Click Score: 24   End of Session Equipment Utilized During Treatment: Gait belt;Rolling walker (2 wheels) Nurse Communication: Mobility status  Activity Tolerance: Patient tolerated treatment well Patient left: in chair;with chair alarm set;with call bell/phone within reach  OT Visit Diagnosis: Other abnormalities of gait and mobility (R26.89)                Time: 4431-5400 OT Time Calculation (min): 21 min Charges:  OT General Charges $OT Visit: 1 Visit OT Evaluation $OT Eval Low Complexity: 1 Low OT Treatments $Self Care/Home Management : 8-22 mins  Shara Blazing, M.S., OTR/L Ascom: [phone] 06/07/22, 11:49 AM

## 2022-06-07 NOTE — TOC Transition Note (Signed)
Transition of Care [facility]) - CM/SW Discharge Note   Patient Details  Name: Madison Davidson MRN: [id number] Date of Birth: June 02, 1951  Transition of Care [facility]) CM/SW Contact:  Candie Chroman, LCSW Phone Number: 06/07/2022, 4:43 PM   Clinical Narrative:  Patient has orders to discharge home today. CSW met with patient. No supports at bedside. CSW introduced role and explained that PT recommendations would be discussed. Patient is agreeable to outpatient PT. Reviewed local options. She prefers [facility] main campus. Will fax referral form once MD cosigns. No further concerns. Her daughter will pick her up. CSW signing off.   Final next level of care: OP Rehab Barriers to Discharge: No Barriers Identified   Patient Goals and CMS Choice     Choice offered to / list presented to : Patient  Discharge Placement                Patient to be transferred to facility by: Daughter   Patient and family notified of of transfer: 06/07/22  Discharge Plan and Services                                     Social Determinants of Health (SDOH) Interventions     Readmission Risk Interventions     No data to display

## 2022-06-07 NOTE — Progress Notes (Signed)
Discussed discharge instructions with patient and daughter, including follow up appointments and medications.   Encouraged patient to keep all follow up appointments.

## 2022-06-07 NOTE — Progress Notes (Signed)
Physical Therapy Evaluation Patient Details Name: Madison Davidson MRN: [id number] DOB: 25-Jul-1951 Today's Date: 06/07/2022  History of Present Illness  Pt is a 71 y.o. female with PMH including DM II, HTN, CAD, R MCA CVA in 04/2021, hypothyroidism, and skin cancer. Pt presented to ED on 06/05/22 with 2-day hx of chest pain with SOB. Pt admitted with acute onset a-fib with rapid ventricular response. Cardiology is following.   Clinical Impression  Pt is a pleasant 71 year old female agreeable to PT evaluation this date. Pt lives alone in an apartment and reports independence with ADLs and ambulatory without AD. Pts daughter present in room during evaluation and reported pt has baseline memory deficits, as pts daughter occasionally corrected pts history reporting. Pt is independent with transfers. Pt was incontinent upon standing after reporting she can tell when she needs to urinate. Pt then ambulated to bathroom to void and was independent with pericare and handwashing. Pt performed 5x sit-to-stand test with bilat UE support in 20.34 sec indicating decreased LE strength/power and increased falls risk. Pt was able to complete 1 sit-to-stand without UE support with verbal cues for proper technique, but unwilling to complete 5 repetitions. Pt ambulated 200 feet around nursing station with mild balance deficits noted such as sway when walking, and noted no L arm swing. Pt demonstrates deficits with balance, strength, and is at an increased risk for falls. Would benefit from skilled PT to address above deficits and promote optimal return to PLOF. Recommend outpatient PT at discharge due to pt current mobility status and PRN family support at home.    Recommendations for follow up therapy are one component of a multi-disciplinary discharge planning process, led by the attending physician.  Recommendations may be updated based on patient status, additional functional criteria and insurance  authorization.  Follow Up Recommendations Outpatient PT      Assistance Recommended at Discharge PRN  Patient can return home with the following  A little help with walking and/or transfers;A little help with bathing/dressing/bathroom;Help with stairs or ramp for entrance;Assist for transportation    Equipment Recommendations None recommended by PT  Recommendations for Other Services       Functional Status Assessment Patient has had a recent decline in their functional status and demonstrates the ability to make significant improvements in function in a reasonable and predictable amount of time.     Precautions / Restrictions Precautions Precautions: Fall Restrictions Weight Bearing Restrictions: No      Mobility  Bed Mobility               General bed mobility comments: NT; pt received and left in chair    Transfers Overall transfer level: Independent Equipment used: None               General transfer comment: Pt uses bilat UE to perform sit-to-stand independently. Pt able to complete 1 sit-to-stand without UEs when asked.    Ambulation/Gait Ambulation/Gait assistance: Supervision Gait Distance (Feet): 200 Feet Assistive device: None Gait Pattern/deviations: Step-through pattern, Drifts right/left Gait velocity: decreased     General Gait Details: Pt reported baseline ambulation is sometimes slower than speed performed this date. Pt had no LOB during ambulation around RN station, but occassional drift L/R.  Stairs            Wheelchair Mobility    Modified Rankin (Stroke Patients Only)       Balance Overall balance assessment: Needs assistance Sitting-balance support: Feet supported, No upper extremity supported Sitting  balance-Leahy Scale: Normal Sitting balance - Comments: Pt able to sit edge of chair without UE support, reaching outside BOS without LOB.   Standing balance support: No upper extremity supported Standing  balance-Leahy Scale: Good Standing balance comment: Pt ambulates without AD, no LOB. However, pt unsteady at times with sway during ambulation.                             Pertinent Vitals/Pain Pain Assessment Pain Assessment: No/denies pain    Home Living Family/patient expects to be discharged to:: Private residence Living Arrangements: Alone Available Help at Discharge: Family;Available PRN/intermittently Type of Home: Apartment Home Access: Level entry       Home Layout: One level Home Equipment: Conservation officer, nature (2 wheels);Cane - single point;Tub bench;Wheelchair - manual      Prior Function Prior Level of Function : Independent/Modified Independent;History of Falls (last six months)             Mobility Comments: Ambulates without AD, but has equipment at home if needed for future use. Pt not driving. Daughters split a schedule to pick up pt and bring her to the grocery store, etc. where pt ambulates independently. Pt reported no falls in the past 6 months, but pts daughter stated pt fell 1 time and hurt her buttocks but was able to get up on her own. ADLs Comments: Daughters assist with driving, otherwise pt states she is totally independent with bathing, dressing, feeding herself.     Hand Dominance   Dominant Hand: Right    Extremity/Trunk Assessment   Upper Extremity Assessment Upper Extremity Assessment: LUE deficits/detail LUE Deficits / Details: Not formally assessed. Pt has mild deficits from R MCA CVA in 2022. Pt able to complete AROM against gravity on L UE. Observed pt holds L UE in add/IR against side during ambulation and when asked to complete arm swing, pt placed hand down at side but did not swing arm during gait.    Lower Extremity Assessment Lower Extremity Assessment: Overall WFL for tasks assessed (Pt gross MMT bilat LEs 4/5 (hip flex, knee ext, knee flex, ankle DF).)    Cervical / Trunk Assessment Cervical / Trunk Assessment: Normal   Communication   Communication: No difficulties  Cognition Arousal/Alertness: Awake/alert Behavior During Therapy: WFL for tasks assessed/performed Overall Cognitive Status: History of cognitive impairments - at baseline                                 General Comments: Pts daughter reported pt has baseline memory deficits (mostly short-term memory) and stated she suspects dementia onset.        General Comments General comments (skin integrity, edema, etc.): HR monitored: at rest 70 bpm, after 5x sit-to-stand 80 bpm, highest during ambulation 90 bpm.    Exercises Other Exercises Other Exercises: Pt performed 5x sit-to-stand test in 20.34 sec with bilat UE support from chair. Pt was able to complete 1 sit-to-stand without UE support, but unwilling to attempt without UE support 5x. Other Exercises: Pt incontinent upon standing after reporting she knows when she has to urinate. Mesh underwear changed, cleaned. Pt ambulated to the bathroom prior to ambulating in the hallway and was independent with pericare and handwashing. Pts daughter stated pt does have occasional incontinence at baseline and has briefs at home.   Assessment/Plan    PT Assessment Patient needs continued PT services  PT Problem List Decreased balance;Decreased strength;Decreased safety awareness;Decreased knowledge of use of DME       PT Treatment Interventions DME instruction;Gait training;Functional mobility training;Therapeutic activities;Therapeutic exercise;Balance training;Patient/family education    PT Goals (Current goals can be found in the Care Plan section)  Acute Rehab PT Goals Patient Stated Goal: to improve balance and go home PT Goal Formulation: With patient Time For Goal Achievement: 06/21/22 Potential to Achieve Goals: Good    Frequency Min 2X/week     Co-evaluation               AM-PAC PT "6 Clicks" Mobility  Outcome Measure Help needed turning from your back to your  side while in a flat bed without using bedrails?: None Help needed moving from lying on your back to sitting on the side of a flat bed without using bedrails?: None Help needed moving to and from a bed to a chair (including a wheelchair)?: None Help needed standing up from a chair using your arms (e.g., wheelchair or bedside chair)?: None Help needed to walk in hospital room?: A Little Help needed climbing 3-5 steps with a railing? : A Little 6 Click Score: 22    End of Session Equipment Utilized During Treatment: Gait belt Activity Tolerance: Patient tolerated treatment well Patient left: in chair;with call bell/phone within reach;with chair alarm set;with nursing/sitter in room;with family/visitor present Nurse Communication: Mobility status PT Visit Diagnosis: Unsteadiness on feet (R26.81);History of falling (Z91.81)    Time: 1287-8676 PT Time Calculation (min) (ACUTE ONLY): 41 min   Charges:              Rella Larve, SPT  Fidelis Loth 06/07/2022, 12:21 PM

## 2022-06-07 NOTE — Discharge Summary (Signed)
Physician Discharge Summary  Madison Davidson JHE:[id number] DOB: Jan 16, 1951 DOA: 06/05/2022  [facility]P: [facility] [place] date: 06/05/2022 Discharge date: 06/07/2022  Admitted From: home  Disposition:  home   Recommendations for Outpatient Follow-up:  Follow up with PCP in 1-2 weeks F/u w/ cardio, Dr. Nehemiah Massed, in 1 week  Home Health: no  Equipment/Devices:  Discharge Condition: stable  CODE STATUS: full  Diet recommendation: Heart Healthy / Carb Modified   Brief/Interim Summary: HPI was taken from Dr. Francine Graven: Madison Davidson is a 71 y.o. female with medical history significant for diabetes mellitus, hypertension, coronary artery disease who presents to the ER for evaluation of a 2-day history of chest pain that woke her up out of her sleep.  Pain has been mostly midsternal associated with shortness of breath, nonradiating and she denies having any associated symptoms such as nausea, vomiting, diaphoresis.  She has had palpitations.  Chest pain is not related to rest or exertion. Patient lost her son about a week ago and he was cremated on Sunday (06/03/22) She denies having any cough, no fever, no chills, no dizziness, no lightheadedness, no leg swelling, no headache, no changes in her bowel habits or any urinary symptoms, no blurred vision no focal deficit. Upon arrival to the ER she was noted to be tachycardic with heart rate of 180 and received aspirin 324 mg and 2.5 mg of metoprolol IV.  She also received 400 cc of normal saline.    As per Dr. Jimmye Norman 7/12-7/13/23: Pt was found to be in new onset a. fib w/ RVR. Pt was initially treated w/ IV diltiazem & IV heparin drips. Both of these medications were weaned off and pt was started on po amio, eliquis as per cardio. Pt was d/c home on eliquis, plavix and amio as per cardio recs. PT evaluated pt and recommended outpatient PT. For more information, please see previous progress/consult notes.   Discharge Diagnoses:   Principal Problem:   Atrial fibrillation with rapid ventricular response (HCC) Active Problems:   History of CVA (cerebrovascular accident)   Hypertension   Anemia of chronic disease   Elevated troponin  A. fib: w/ RVR. New onset. Continue on amiodarone '200mg'$  BID x 5 days then '200mg'$  daily thereafter, eliquis. D/c IV hepain & IV diltiazem. Echo showed EF 18-56%, normal diastolic function, no regional wall motion abnormalities & no atrial level shunts.    Hx of CVA: s/p IR stenting. Continue on plavix, eliquis & statin    HTN: continue on lisinopril, amio    Elevated troponin: likely secondary to demand ischemia from a. fib w/ RVR   ACD: denies any bloody or black stools and denies NSAID use. Was aspirin, plavix at home previously but d/c home on eliquis, plavix only. D/c IV heparin drip. H&H are trending up today.   Hypokalemia: WNL today    Discharge Instructions  Discharge Instructions     Diet - low sodium heart healthy   Complete by: As directed    Diet Carb Modified   Complete by: As directed    Discharge instructions   Complete by: As directed    F/u w/ PCP in 1-2 weeks. F/u w/ cardio, Dr. Nehemiah Massed, in 1 week   Increase activity slowly   Complete by: As directed       Allergies as of 06/07/2022   No Known Allergies      Medication List     STOP taking these medications    acidophilus Caps capsule  aspirin EC 81 MG tablet       TAKE these medications    acetaminophen 325 MG tablet Commonly known as: TYLENOL Take 2 tablets (650 mg total) by mouth every 6 (six) hours as needed for mild pain, fever or headache.   amiodarone 200 MG tablet Commonly known as: PACERONE Take 1 tablet (200 mg total) by mouth 2 (two) times daily for 2 days.   amiodarone 200 MG tablet Commonly known as: PACERONE Take 1 tablet (200 mg total) by mouth daily. Only start this script after completing amiodarone '200mg'$  BID x 2 days Start taking on: June 10, 2022   apixaban 5 MG  Tabs tablet Commonly known as: ELIQUIS Take 1 tablet (5 mg total) by mouth 2 (two) times daily.   atorvastatin 20 MG tablet Commonly known as: LIPITOR Take 4 tablets (80 mg total) by mouth at bedtime. What changed:  how much to take when to take this additional instructions   cholecalciferol 25 MCG (1000 UNIT) tablet Commonly known as: VITAMIN D3 Take 1,000 Units by mouth daily.   clopidogrel 75 MG tablet Commonly known as: PLAVIX Take 75 mg by mouth daily.   ketoconazole 2 % cream Commonly known as: NIZORAL 1 Application daily. APPLY TO BUTTOCKS   levothyroxine 75 MCG tablet Commonly known as: SYNTHROID Take 75 mcg by mouth daily.   lisinopril 20 MG tablet Commonly known as: ZESTRIL Take 1 tablet (20 mg total) by mouth daily.   melatonin 3 MG Tabs tablet Take 3 mg by mouth at bedtime.   metFORMIN 1000 MG tablet Commonly known as: GLUCOPHAGE Take 1 tablet (1,000 mg total) by mouth 2 (two) times daily with a meal.   mupirocin ointment 2 % Commonly known as: BACTROBAN SMARTSIG:1 Application Topical 2-3 Times Daily   omeprazole 20 MG capsule Commonly known as: PRILOSEC Take 20 mg by mouth daily.   polycarbophil 625 MG tablet Commonly known as: FIBERCON Take 1 tablet (625 mg total) by mouth 2 (two) times daily.   vitamin B-12 500 MCG tablet Commonly known as: CYANOCOBALAMIN Take 1 tablet (500 mcg total) by mouth daily.        Follow-up Information     Corey Skains, MD. Go in 1 week(s).   Specialty: Cardiology Contact information: [address] [place] [address] [phone]                No Known Allergies  Consultations: Cardio    Procedures/Studies: ECHOCARDIOGRAM COMPLETE  Result Date: 06/06/2022    ECHOCARDIOGRAM REPORT   Patient Name:   Madison Davidson Date of Exam: 06/06/2022 Medical Rec #:  [id number]        Height:       61.0 in Accession #:    [id number]       Weight:       135.4  lb Date of Birth:  26-Jun-1951       BSA:          1.600 m Patient Age:    62 years         BP:           154/42 mmHg Patient Gender: F                HR:           66 bpm. Exam Location:  [facility] Procedure: 2D Echo, Color Doppler and Cardiac Doppler Indications:     I48.91 Atrial fibrillation  History:  Patient has prior history of Echocardiogram examinations, most                  recent 05/09/2021. CAD, Stroke; Risk Factors:Hypertension and                  Diabetes.  Sonographer:     Charmayne Sheer Referring Phys:  [id number] Patterson TANG Diagnosing Phys: Serafina Royals MD  Sonographer Comments: Suboptimal apical window. IMPRESSIONS  1. Left ventricular ejection fraction, by estimation, is 60 to 65%. The left ventricle has normal function. The left ventricle has no regional wall motion abnormalities. Left ventricular diastolic parameters were normal.  2. Right ventricular systolic function is normal. The right ventricular size is normal.  3. The mitral valve is normal in structure. Trivial mitral valve regurgitation.  4. The aortic valve is normal in structure. Aortic valve regurgitation is not visualized. FINDINGS  Left Ventricle: Left ventricular ejection fraction, by estimation, is 60 to 65%. The left ventricle has normal function. The left ventricle has no regional wall motion abnormalities. The left ventricular internal cavity size was normal in size. There is  no left ventricular hypertrophy. Left ventricular diastolic parameters were normal. Right Ventricle: The right ventricular size is normal. No increase in right ventricular wall thickness. Right ventricular systolic function is normal. Left Atrium: Left atrial size was normal in size. Right Atrium: Right atrial size was normal in size. Pericardium: There is no evidence of pericardial effusion. Mitral Valve: The mitral valve is normal in structure. Trivial mitral valve regurgitation. Tricuspid Valve: The tricuspid valve is normal in structure.  Tricuspid valve regurgitation is mild. Aortic Valve: The aortic valve is normal in structure. Aortic valve regurgitation is not visualized. Aortic valve mean gradient measures 4.0 mmHg. Aortic valve peak gradient measures 6.6 mmHg. Aortic valve area, by VTI measures 1.48 cm. Pulmonic Valve: The pulmonic valve was normal in structure. Pulmonic valve regurgitation is not visualized. Aorta: The aortic root and ascending aorta are structurally normal, with no evidence of dilitation. IAS/Shunts: No atrial level shunt detected by color flow Doppler.  LEFT VENTRICLE PLAX 2D LVIDd:         3.58 cm   Diastology LVIDs:         2.32 cm   LV e' medial:    6.53 cm/s LV PW:         1.15 cm   LV E/e' medial:  18.5 LV IVS:        1.05 cm   LV e' lateral:   5.11 cm/s LVOT diam:     1.70 cm   LV E/e' lateral: 23.7 LV SV:         40 LV SV Index:   25 LVOT Area:     2.27 cm  LEFT ATRIUM             Index LA diam:        2.80 cm 1.75 cm/m LA Vol (A2C):   28.9 ml 18.06 ml/m LA Vol (A4C):   38.8 ml 24.25 ml/m LA Biplane Vol: 33.6 ml 21.00 ml/m  AORTIC VALVE                    PULMONIC VALVE AV Area (Vmax):    1.52 cm     PV Vmax:       1.13 m/s AV Area (Vmean):   1.52 cm     PV Peak grad:  5.1 mmHg AV Area (VTI):     1.48 cm  AV Vmax:           128.00 cm/s AV Vmean:          95.500 cm/s AV VTI:            0.270 m AV Peak Grad:      6.6 mmHg AV Mean Grad:      4.0 mmHg LVOT Vmax:         85.80 cm/s LVOT Vmean:        64.000 cm/s LVOT VTI:          0.176 m LVOT/AV VTI ratio: 0.65  AORTA Ao Root diam: 3.10 cm MITRAL VALVE MV Area (PHT): 3.45 cm     SHUNTS MV Decel Time: 220 msec     Systemic VTI:  0.18 m MV E velocity: 121.00 cm/s  Systemic Diam: 1.70 cm MV A velocity: 81.50 cm/s MV E/A ratio:  1.48 Serafina Royals MD Electronically signed by Serafina Royals MD Signature Date/Time: 06/06/2022/12:11:53 PM    Final    DG Chest Port 1 View  Result Date: 06/05/2022 CLINICAL DATA:  71 year old female with history of chest pain and  shortness of breath. EXAM: PORTABLE CHEST 1 VIEW COMPARISON:  Chest x-ray 01/24/2022. FINDINGS: Lung volumes are normal. No consolidative airspace disease. No pleural effusions. No pneumothorax. No pulmonary nodule or mass noted. Pulmonary vasculature and the cardiomediastinal silhouette are within normal limits. IMPRESSION: No radiographic evidence of acute cardiopulmonary disease. Electronically Signed   By: Vinnie Langton M.D.   On: 06/05/2022 07:58   (Echo, Carotid, EGD, Colonoscopy, ERCP)    Subjective: Pt c/o fatigue    Discharge Exam: Vitals:   06/07/22 0748 06/07/22 1203  BP: (!) 169/62 (!) 143/61  Pulse: 63 64  Resp: 16 18  Temp: 97.7 F (36.5 C) 97.9 F (36.6 C)  SpO2: 99% 100%   Vitals:   06/06/22 2321 06/07/22 0321 06/07/22 0748 06/07/22 1203  BP: (!) 163/51 (!) 158/65 (!) 169/62 (!) 143/61  Pulse: 61  63 64  Resp: '18  16 18  '$ Temp: 98.4 F (36.9 C) 97.9 F (36.6 C) 97.7 F (36.5 C) 97.9 F (36.6 C)  TempSrc: Oral Oral    SpO2: 98% 100% 99% 100%  Weight:      Height:        General: Pt is alert, awake, not in acute distress Cardiovascular: irregularly irregular, no rubs, no gallops Respiratory: CTA bilaterally, no wheezing, no rhonchi Abdominal: Soft, NT, ND, bowel sounds + Extremities: no edema, no cyanosis    The results of significant diagnostics from this hospitalization (including imaging, microbiology, ancillary and laboratory) are listed below for reference.     Microbiology: No results found for this or any previous visit (from the past 240 hour(s)).   Labs: BNP (last 3 results) No results for input(s): "BNP" in the last 8760 hours. Basic Metabolic Panel: Recent Labs  Lab 06/05/22 0742 06/06/22 0404 06/07/22 0620  NA 139 141 140  K 3.6 3.2* 3.6  CL 109 109 108  CO2 21* 25 26  GLUCOSE 102* 130* 141*  BUN '13 12 8  '$ CREATININE 0.62 0.68 0.75  CALCIUM 8.5* 8.3* 8.6*   Liver Function Tests: No results for input(s): "AST", "ALT",  "ALKPHOS", "BILITOT", "PROT", "ALBUMIN" in the last 168 hours. No results for input(s): "LIPASE", "AMYLASE" in the last 168 hours. No results for input(s): "AMMONIA" in the last 168 hours. CBC: Recent Labs  Lab 06/05/22 0742 06/06/22 0404 06/07/22 0620  WBC 9.0 8.3 8.3  HGB 9.7* 8.8* 9.8*  HCT 31.5* 27.9* 30.5*  MCV 85.8 84.0 84.5  PLT 376 334 335   Cardiac Enzymes: No results for input(s): "CKTOTAL", "CKMB", "CKMBINDEX", "TROPONINI" in the last 168 hours. BNP: Invalid input(s): "POCBNP" CBG: Recent Labs  Lab 06/06/22 1139 06/06/22 1631 06/06/22 2125 06/07/22 0747 06/07/22 1145  GLUCAP 153* 96 140* 137* 128*   D-Dimer No results for input(s): "DDIMER" in the last 72 hours. Hgb A1c No results for input(s): "HGBA1C" in the last 72 hours. Lipid Profile No results for input(s): "CHOL", "HDL", "LDLCALC", "TRIG", "CHOLHDL", "LDLDIRECT" in the last 72 hours. Thyroid function studies Recent Labs    06/05/22 1225  TSH 0.149*   Anemia work up Recent Labs    06/07/22 0620  FERRITIN 15  TIBC 312  IRON 35   Urinalysis    Component Value Date/Time   COLORURINE STRAW (A) 01/24/2022 0501   APPEARANCEUR HAZY (A) 01/24/2022 0501   LABSPEC 1.006 01/24/2022 0501   PHURINE 5.0 01/24/2022 0501   GLUCOSEU >=500 (A) 01/24/2022 0501   HGBUR NEGATIVE 01/24/2022 0501   BILIRUBINUR NEGATIVE 01/24/2022 0501   KETONESUR NEGATIVE 01/24/2022 0501   PROTEINUR NEGATIVE 01/24/2022 0501   NITRITE NEGATIVE 01/24/2022 0501   LEUKOCYTESUR MODERATE (A) 01/24/2022 0501   Sepsis Labs Recent Labs  Lab 06/05/22 0742 06/06/22 0404 06/07/22 0620  WBC 9.0 8.3 8.3   Microbiology No results found for this or any previous visit (from the past 240 hour(s)).   Time coordinating discharge: Over 30 minutes  SIGNED:   Wyvonnia Dusky, MD  [facility] 06/07/2022, 4:05 PM Pager   If 7PM-7AM, please contact night-coverage [email or web address]

## 2023-07-23 ENCOUNTER — Other Ambulatory Visit: Payer: Self-pay

## 2023-07-23 ENCOUNTER — Emergency Department
Admission: EM | Admit: 2023-07-23 | Discharge: 2023-07-23 | Disposition: A | Discharge status: Home / Self Care | Payer: Medicare Other | Attending: Emergency Medicine | Admitting: Emergency Medicine

## 2023-07-23 ENCOUNTER — Emergency Department: Payer: Medicare Other

## 2023-07-23 DIAGNOSIS — N390 Urinary tract infection, site not specified: Secondary | ICD-10-CM | POA: Diagnosis not present

## 2023-07-23 DIAGNOSIS — R55 Syncope and collapse: Secondary | ICD-10-CM | POA: Diagnosis present

## 2023-07-23 LAB — URINALYSIS, ROUTINE W REFLEX MICROSCOPIC
Bilirubin Urine: NEGATIVE
Glucose, UA: NEGATIVE mg/dL
Ketones, ur: NEGATIVE mg/dL
Nitrite: POSITIVE — AB
Protein, ur: NEGATIVE mg/dL
Specific Gravity, Urine: 1.011 (ref 1.005–1.030)
WBC, UA: >50 WBC/hpf (ref 0–5)
pH: 5 (ref 5.0–8.0)

## 2023-07-23 LAB — BASIC METABOLIC PANEL
Anion gap: 8 (ref 5–15)
BUN: 12 mg/dL (ref 8–23)
CO2: 21 mmol/L — ABNORMAL LOW (ref 22–32)
Calcium: 8.4 mg/dL — ABNORMAL LOW (ref 8.9–10.3)
Chloride: 106 mmol/L (ref 98–111)
Creatinine, Ser: 0.92 mg/dL (ref 0.44–1.00)
GFR, Estimated: >60 mL/min (ref >60)
Glucose, Bld: 118 mg/dL — ABNORMAL HIGH (ref 70–99)
Potassium: 3.9 mmol/L (ref 3.5–5.1)
Sodium: 135 mmol/L (ref 135–145)

## 2023-07-23 LAB — CBC
HCT: 33.1 % — ABNORMAL LOW (ref 36.0–46.0)
Hemoglobin: 10.5 g/dL — ABNORMAL LOW (ref 12.0–15.0)
MCH: 28.2 pg (ref 26.0–34.0)
MCHC: 31.7 g/dL (ref 30.0–36.0)
MCV: 89 fL (ref 80.0–100.0)
Platelets: 256 10*3/uL (ref 150–400)
RBC: 3.72 MIL/uL — ABNORMAL LOW (ref 3.87–5.11)
RDW: 12.9 % (ref 11.5–15.5)
WBC: 6.6 10*3/uL (ref 4.0–10.5)
nRBC: 0 % (ref 0.0–0.2)

## 2023-07-23 LAB — TROPONIN I (HIGH SENSITIVITY)
Troponin I (High Sensitivity): 4 ng/L (ref <18)
Troponin I (High Sensitivity): 10 ng/L (ref <18)

## 2023-07-23 MED ORDER — SODIUM CHLORIDE 0.9 % IV BOLUS
500.0000 mL | Freq: Once | INTRAVENOUS | Status: AC
  Administered 2023-07-23: 500 mL via INTRAVENOUS

## 2023-07-23 MED ORDER — CEPHALEXIN 500 MG PO CAPS: 500.0000 mg | ORAL_CAPSULE | Freq: Four times a day (QID) | ORAL | 0 refills | Status: AC

## 2023-07-23 MED ORDER — SODIUM CHLORIDE 0.9 % IV SOLN
1.0000 g | Freq: Once | INTRAVENOUS | Status: AC
  Administered 2023-07-23: 1 g via INTRAVENOUS
  Filled 2023-07-23: qty 10

## 2023-07-23 NOTE — ED Notes (Signed)
Pt ambulatory to the restroom at this this time. 1 person assist with walker to the bathroom. Pt had a BM, unable to send contaminated urine sample. Dr. Derrill Kay, EDP made aware.

## 2023-07-23 NOTE — ED Notes (Signed)
Dr. Goodman, EDP at bedside.  

## 2023-07-23 NOTE — Discharge Instructions (Signed)
Please seek medical attention for any high fevers, chest pain, shortness of breath, change in behavior, persistent vomiting, bloody stool or any other new or concerning symptoms.  

## 2023-07-23 NOTE — ED Notes (Signed)
Pt given sandwich tray and ginger ale, with MD approval, at this time.

## 2023-07-23 NOTE — ED Notes (Signed)
Spoke with pt's daughter, Martie Lee, states she is on the way to pick up the pt and will be here approx 20 mins.

## 2023-07-23 NOTE — ED Notes (Signed)
Pt transported to CT at this time.

## 2023-07-23 NOTE — ED Provider Notes (Signed)
[facility] Provider Note    Event Date/Time   First MD Initiated Contact with Patient 07/23/23 [phone]     (approximate)   History   Loss of Consciousness   HPI  Madison Davidson is a 72 y.o. female who presents to the emergency department today after a fall.  The patient was at an [employer] when she says her legs gave out.  She denies blacking out.  She says this has happened to her in the past.  Otherwise prior to the incident she says she was in her normal state of health.  She is having some pain around her buttocks after the fall.     Physical Exam   Triage Vital Signs: ED Triage Vitals  Encounter Vitals Group     BP 07/23/23 0904 (!) 116/44     Systolic BP Percentile --      Diastolic BP Percentile --      Pulse Rate 07/23/23 0904 (!) 50     Resp 07/23/23 0904 14     Temp 07/23/23 0904 98 F (36.7 C)     Temp src --      SpO2 07/23/23 0904 100 %     Weight --      Height --      Head Circumference --      Peak Flow --      Pain Score 07/23/23 0901 1     Pain Loc --      Pain Education --      Exclude from Growth Chart --     Most recent vital signs: Vitals:   07/23/23 0904  BP: (!) 116/44  Pulse: (!) 50  Resp: 14  Temp: 98 F (36.7 C)  SpO2: 100%    General: Awake, alert, oriented. CV:  Good peripheral perfusion. Bradycardia. Resp:  Normal effort. Lungs clear. Abd:  No distention. Non tender.   ED Results / Procedures / Treatments   Labs (all labs ordered are listed, but only abnormal results are displayed) Labs Reviewed  BASIC METABOLIC PANEL - Abnormal; Notable for the following components:      Result Value   CO2 21 (*)    Glucose, Bld 118 (*)    Calcium 8.4 (*)    All other components within normal limits  CBC - Abnormal; Notable for the following components:   RBC 3.72 (*)    Hemoglobin 10.5 (*)    HCT 33.1 (*)    All other components within normal limits  URINALYSIS, ROUTINE W REFLEX MICROSCOPIC - Abnormal;  Notable for the following components:   Color, Urine YELLOW (*)    APPearance CLOUDY (*)    Hgb urine dipstick SMALL (*)    Nitrite POSITIVE (*)    Leukocytes,Ua LARGE (*)    Bacteria, UA MANY (*)    All other components within normal limits  URINE CULTURE  CBG MONITORING, ED  TROPONIN I (HIGH SENSITIVITY)  TROPONIN I (HIGH SENSITIVITY)     EKG  I, Phineas Semen, attending physician, personally viewed and interpreted this EKG  EKG Time: 0904 Rate: 53 Rhythm: sinus bradycardia Axis: normal Intervals: qtc 390 QRS: narrow ST changes: no st elevation Impression: abnormal ekg  RADIOLOGY  I independently interpreted and visualized the CT head. My interpretation: No bleed Radiology interpretation:  IMPRESSION:  1. No acute intracranial abnormality.  2. No acute cervical spine fracture or traumatic listhesis.    I independently interpreted and visualized the CT cervical spine. My  interpretation: No fracture Radiology interpretation:  IMPRESSION:  1. No acute intracranial abnormality.  2. No acute cervical spine fracture or traumatic listhesis.     PROCEDURES:  Critical Care performed: No   MEDICATIONS ORDERED IN ED: Medications - No data to display   IMPRESSION / MDM / ASSESSMENT AND PLAN / ED COURSE  I reviewed the triage vital signs and the nursing notes.                              Differential diagnosis includes, but is not limited to, anemia, dehydration, ACS, arrythmia  Patient's presentation is most consistent with acute presentation with potential threat to life or bodily function.   The patient is on the cardiac monitor to evaluate for evidence of arrhythmia and/or significant heart rate changes.  Patient presents to the emergency department today because of concerns for an episode of her legs falling out.  On exam patient is awake and alert.  Blood work without any concerning abnormalities, patient has been anemic on past blood work.  UA however  is concerning for urinary tract infection.  I do think this could explain the patient's episode today.  Will give dose of IV antibiotics here in the emergency department and plan on discharging on further antibiotics.  Patient without leukocytosis or fever here in the emergency department.      FINAL CLINICAL IMPRESSION(S) / ED DIAGNOSES   Final diagnoses:  Lower urinary tract infectious disease     Note:  This document was prepared using Dragon voice recognition software and may include unintentional dictation errors.    Phineas Semen, MD 07/23/23 [phone]

## 2023-07-23 NOTE — ED Triage Notes (Signed)
Pt comes via EMS from ATM. Pt was at the ATM and had a syncopal episode. Bystander stated she was standing at ATM and then he looked back up and she was on ground. Pt was then assisted up by fire and then went down again. Pt states weakness in legs. Pt has hx of tia with some memory deficits.   Pt currently at baseline per pt. Pt states some butt pain. Pt denies any hitting of head and unsure of thinners.

## 2023-07-23 NOTE — ED Notes (Signed)
Dr. Derrill Kay, EDP at bedside for reevaluation.

## 2023-07-24 LAB — URINE CULTURE

## 2024-02-29 ENCOUNTER — Emergency Department (HOSPITAL_COMMUNITY)

## 2024-02-29 ENCOUNTER — Encounter (HOSPITAL_COMMUNITY): Payer: Self-pay

## 2024-02-29 ENCOUNTER — Emergency Department (HOSPITAL_COMMUNITY)
Admission: EM | Admit: 2024-02-29 | Discharge: 2024-02-29 | Disposition: A | Discharge status: Home / Self Care | Attending: Emergency Medicine | Admitting: Emergency Medicine

## 2024-02-29 ENCOUNTER — Other Ambulatory Visit: Payer: Self-pay

## 2024-02-29 DIAGNOSIS — R9389 Abnormal findings on diagnostic imaging of other specified body structures: Secondary | ICD-10-CM | POA: Insufficient documentation

## 2024-02-29 DIAGNOSIS — Z7901 Long term (current) use of anticoagulants: Secondary | ICD-10-CM | POA: Diagnosis not present

## 2024-02-29 DIAGNOSIS — W01198A Fall on same level from slipping, tripping and stumbling with subsequent striking against other object, initial encounter: Secondary | ICD-10-CM | POA: Insufficient documentation

## 2024-02-29 DIAGNOSIS — S060X0A Concussion without loss of consciousness, initial encounter: Secondary | ICD-10-CM | POA: Diagnosis not present

## 2024-02-29 DIAGNOSIS — S0990XA Unspecified injury of head, initial encounter: Secondary | ICD-10-CM | POA: Diagnosis present

## 2024-02-29 DIAGNOSIS — S0003XA Contusion of scalp, initial encounter: Secondary | ICD-10-CM

## 2024-02-29 LAB — CBC WITH DIFFERENTIAL/PLATELET
Abs Immature Granulocytes: 0.05 10*3/uL (ref 0.00–0.07)
Basophils Absolute: 0.1 10*3/uL (ref 0.0–0.1)
Basophils Relative: 1 %
Eosinophils Absolute: 0.3 10*3/uL (ref 0.0–0.5)
Eosinophils Relative: 3 %
HCT: 31.8 % — ABNORMAL LOW (ref 36.0–46.0)
Hemoglobin: 10.2 g/dL — ABNORMAL LOW (ref 12.0–15.0)
Immature Granulocytes: 1 %
Lymphocytes Relative: 23 %
Lymphs Abs: 2.3 10*3/uL (ref 0.7–4.0)
MCH: 27.1 pg (ref 26.0–34.0)
MCHC: 32.1 g/dL (ref 30.0–36.0)
MCV: 84.4 fL (ref 80.0–100.0)
Monocytes Absolute: 0.8 10*3/uL (ref 0.1–1.0)
Monocytes Relative: 8 %
Neutro Abs: 6.6 10*3/uL (ref 1.7–7.7)
Neutrophils Relative %: 64 %
Platelets: 334 10*3/uL (ref 150–400)
RBC: 3.77 MIL/uL — ABNORMAL LOW (ref 3.87–5.11)
RDW: 13.7 % (ref 11.5–15.5)
WBC: 10.2 10*3/uL (ref 4.0–10.5)
nRBC: 0 % (ref 0.0–0.2)

## 2024-02-29 LAB — COMPREHENSIVE METABOLIC PANEL WITH GFR
ALT: 16 U/L (ref 0–44)
AST: 19 U/L (ref 15–41)
Albumin: 3.2 g/dL — ABNORMAL LOW (ref 3.5–5.0)
Alkaline Phosphatase: 66 U/L (ref 38–126)
Anion gap: 10 (ref 5–15)
BUN: 21 mg/dL (ref 8–23)
CO2: 22 mmol/L (ref 22–32)
Calcium: 8.7 mg/dL — ABNORMAL LOW (ref 8.9–10.3)
Chloride: 105 mmol/L (ref 98–111)
Creatinine, Ser: 0.95 mg/dL (ref 0.44–1.00)
GFR, Estimated: >60 mL/min (ref >60)
Glucose, Bld: 120 mg/dL — ABNORMAL HIGH (ref 70–99)
Potassium: 4.3 mmol/L (ref 3.5–5.1)
Sodium: 137 mmol/L (ref 135–145)
Total Bilirubin: 0.4 mg/dL (ref 0.0–1.2)
Total Protein: 6.7 g/dL (ref 6.5–8.1)

## 2024-02-29 MED ORDER — ONDANSETRON HCL 4 MG PO TABS: 4.0000 mg | ORAL_TABLET | Freq: Four times a day (QID) | ORAL | 0 refills | Status: DC

## 2024-02-29 NOTE — Discharge Instructions (Signed)
 As we discussed your x-ray shows an abnormal finding on your lung, you will need to have your family doctor order a CT scan to further clarify to make sure there is nothing concerning such as a mass or a tumor in that location.  Please call them first thing on Monday morning to arrange this for you.  The CT scan your brain showed no signs of bleeding or trauma to your brain but you do have a concussion, because you are on Eliquis she will need to take Tylenol but do not take ibuprofen or Aleve or aspirin containing products.  Zofran as needed for nausea  Thank you for allowing [place] to treat you in the emergency department today.  After reviewing your examination and potential testing that was done it appears that you are safe to go home.  I would like for you to follow-up with your doctor within the next several days, have them obtain your records and follow-up with them to review all potential tests and results from your visit.  If you should develop severe or worsening symptoms return to the emergency department immediately

## 2024-02-29 NOTE — ED Notes (Signed)
 Pt states she is unable to provide a urine sample at this time.

## 2024-02-29 NOTE — ED Provider Notes (Signed)
 [facility] EMERGENCY DEPARTMENT AT [facility] Provider Note   CSN: [id number] Arrival date & time: 02/29/24  1220     History  Chief Complaint  Patient presents with   Madison Davidson is a 73 y.o. female.   Fall   This patient is a 73 year old female, she is on amiodarone as well as Eliquis, history of atrial fibrillation, presents to the hospital today with a complaint of a syncopal event that occurred today while she was in line at a restaurant.  The patient denies to me any other symptoms including no headache, no changes in vision, no chest pain shortness of breath coughing and has had no fevers chills nausea vomiting or diarrhea.  She reports that she was in line to try to get something to eat when she felt dizzy and fell backwards, she did strike her head on the ground.  A family member who was at her side during this states that there was no loss of consciousness and she was awake when she was on the ground.    Home Medications Prior to Admission medications   Medication Sig Start Date End Date Taking? Authorizing Provider  ondansetron (ZOFRAN) 4 MG tablet Take 1 tablet (4 mg total) by mouth every 6 (six) hours. 02/29/24  Yes Eber Hong, MD  acetaminophen (TYLENOL) 325 MG tablet Take 2 tablets (650 mg total) by mouth every 6 (six) hours as needed for mild pain, fever or headache. 05/24/21   Angiulli, Mcarthur Rossetti, PA-C  amiodarone (PACERONE) 200 MG tablet Take 1 tablet (200 mg total) by mouth 2 (two) times daily for 2 days. 06/07/22 06/09/22  Charise Killian, MD  amiodarone (PACERONE) 200 MG tablet Take 1 tablet (200 mg total) by mouth daily. Only start this script after completing amiodarone 200mg  BID x 2 days 06/10/22 07/10/22  Charise Killian, MD  apixaban (ELIQUIS) 5 MG TABS tablet Take 1 tablet (5 mg total) by mouth 2 (two) times daily. 06/07/22 07/07/22  Charise Killian, MD  atorvastatin (LIPITOR) 20 MG tablet Take 4 tablets (80 mg total) by mouth at  bedtime. Patient taking differently: Take 40 mg by mouth in the morning and at bedtime. 2qaM 2HS 05/24/21   Angiulli, Mcarthur Rossetti, PA-C  cholecalciferol (VITAMIN D3) 25 MCG (1000 UNIT) tablet Take 1,000 Units by mouth daily.    [provider]  clopidogrel (PLAVIX) 75 MG tablet Take 75 mg by mouth daily. 11/13/21   [provider]  ketoconazole (NIZORAL) 2 % cream 1 Application daily. APPLY TO BUTTOCKS 09/21/21   [provider]  levothyroxine (SYNTHROID) 75 MCG tablet Take 75 mcg by mouth daily. 10/30/21   [provider]  lisinopril (ZESTRIL) 20 MG tablet Take 1 tablet (20 mg total) by mouth daily. 05/24/21   Angiulli, Mcarthur Rossetti, PA-C  melatonin 3 MG TABS tablet Take 3 mg by mouth at bedtime. 06/08/21   [provider]  metFORMIN (GLUCOPHAGE) 1000 MG tablet Take 1 tablet (1,000 mg total) by mouth 2 (two) times daily with a meal. 05/24/21   Angiulli, Mcarthur Rossetti, PA-C  mupirocin ointment (BACTROBAN) 2 % SMARTSIG:1 Application Topical 2-3 Times Daily 11/10/21   [provider]  omeprazole (PRILOSEC) 20 MG capsule Take 20 mg by mouth daily.    [provider]  polycarbophil (FIBERCON) 625 MG tablet Take 1 tablet (625 mg total) by mouth 2 (two) times daily. 05/24/21   Angiulli, Mcarthur Rossetti, PA-C  vitamin B-12 (CYANOCOBALAMIN) 500  MCG tablet Take 1 tablet (500 mcg total) by mouth daily. Patient not taking: Reported on 06/05/2022 06/14/21   Kathlen Mody, MD      Allergies    Patient has no known allergies.    Review of Systems   Review of Systems  All other systems reviewed and are negative.   Physical Exam Updated Vital Signs BP (!) 108/42   Pulse (!) 56   Temp 98 F (36.7 C) (Oral)   Resp 18   Ht 1.524 m (5')   Wt 86.2 kg   SpO2 97%   BMI 37.11 kg/m  Physical Exam Vitals and nursing note reviewed.  Constitutional:      General: She is not in acute distress.    Appearance: She is well-developed.  HENT:     Head: Normocephalic.      Comments: Hematoma to the left posterior occiput, there is no active bleeding, no lacerations    Mouth/Throat:     Pharynx: No oropharyngeal exudate.  Eyes:     General: No scleral icterus.       Right eye: No discharge.        Left eye: No discharge.     Conjunctiva/sclera: Conjunctivae normal.     Pupils: Pupils are equal, round, and reactive to light.  Neck:     Thyroid: No thyromegaly.     Vascular: No JVD.     Comments: There is no spinal tenderness over the cervical spine or the thoracic or lumbar spines Cardiovascular:     Rate and Rhythm: Normal rate.     Heart sounds: Normal heart sounds. No murmur heard.    No friction rub. No gallop.  Pulmonary:     Effort: Pulmonary effort is normal. No respiratory distress.     Breath sounds: Normal breath sounds. No wheezing or rales.  Abdominal:     General: Bowel sounds are normal. There is no distension.     Palpations: Abdomen is soft. There is no mass.     Tenderness: There is no abdominal tenderness.  Musculoskeletal:        General: No tenderness. Normal range of motion.     Cervical back: Normal range of motion and neck supple.     Right lower leg: No edema.     Left lower leg: No edema.  Lymphadenopathy:     Cervical: No cervical adenopathy.  Skin:    General: Skin is warm and dry.     Findings: No erythema or rash.  Neurological:     Mental Status: She is alert.     Coordination: Coordination normal.     Comments: Awake alert and able to follow commands, normal visual acuity per patient, cranial nerves III through XII are normal, moves all 4 extremities, follows commands without difficulty, normal strength in all 4 extremities, normal level of alertness.  Psychiatric:        Behavior: Behavior normal.     ED Results / Procedures / Treatments   Labs (all labs ordered are listed, but only abnormal results are displayed) Labs Reviewed  CBC WITH DIFFERENTIAL/PLATELET - Abnormal; Notable for the following components:       Result Value   RBC 3.77 (*)    Hemoglobin 10.2 (*)    HCT 31.8 (*)    All other components within normal limits  COMPREHENSIVE METABOLIC PANEL WITH GFR - Abnormal; Notable for the following components:   Glucose, Bld 120 (*)    Calcium 8.7 (*)  Albumin 3.2 (*)    All other components within normal limits    EKG EKG Interpretation Date/Time:  Saturday February 29 2024 13:53:41 EDT Ventricular Rate:  72 PR Interval:  164 QRS Duration:  97 QT Interval:  376 QTC Calculation: 412 R Axis:   34  Text Interpretation: Sinus rhythm Low voltage, precordial leads RSR' in V1 or V2, right VCD or RVH Probable anteroseptal infarct, old since 06/2023, no sig changes seen Confirmed by Eber Hong ([id number]) on 02/29/2024 1:57:36 PM  Radiology CT Head Wo Contrast Result Date: 02/29/2024 CLINICAL DATA:  Polytrauma, blunt EXAM: CT HEAD WITHOUT CONTRAST CT CERVICAL SPINE WITHOUT CONTRAST TECHNIQUE: Multidetector CT imaging of the head and cervical spine was performed following the standard protocol without intravenous contrast. Multiplanar CT image reconstructions of the cervical spine were also generated. RADIATION DOSE REDUCTION: This exam was performed according to the departmental dose-optimization program which includes automated exposure control, adjustment of the mA and/or kV according to patient size and/or use of iterative reconstruction technique. COMPARISON:  CT head and C-spine 07/23/2023 FINDINGS: CT HEAD FINDINGS Brain: No evidence of large-territorial acute infarction. No parenchymal hemorrhage. No mass lesion. No extra-axial collection. No mass effect or midline shift. No hydrocephalus. Basilar cisterns are patent. Vascular: No hyperdense vessel. Skull: No acute fracture or focal lesion. Sinuses/Orbits: Paranasal sinuses and mastoid air cells are clear. The orbits are unremarkable. Other: Left occipital scalp 14 mm hematoma formation. CT CERVICAL SPINE FINDINGS Alignment: Normal. Skull base and  vertebrae: Multilevel moderate degenerative changes of the spine. No acute fracture. No aggressive appearing focal osseous lesion or focal pathologic process. Soft tissues and spinal canal: No prevertebral fluid or swelling. No visible canal hematoma. Upper chest: Unremarkable. Other: Atherosclerotic plaque of the carotid arteries within the neck. IMPRESSION: 1. No acute intracranial abnormality. 2. No acute displaced fracture or traumatic listhesis of the cervical spine. 3.  Aortic Atherosclerosis (ICD10-I70.0). Electronically Signed   By: Tish Frederickson M.D.   On: 02/29/2024 13:55   CT Cervical Spine Wo Contrast Result Date: 02/29/2024 CLINICAL DATA:  Polytrauma, blunt EXAM: CT HEAD WITHOUT CONTRAST CT CERVICAL SPINE WITHOUT CONTRAST TECHNIQUE: Multidetector CT imaging of the head and cervical spine was performed following the standard protocol without intravenous contrast. Multiplanar CT image reconstructions of the cervical spine were also generated. RADIATION DOSE REDUCTION: This exam was performed according to the departmental dose-optimization program which includes automated exposure control, adjustment of the mA and/or kV according to patient size and/or use of iterative reconstruction technique. COMPARISON:  CT head and C-spine 07/23/2023 FINDINGS: CT HEAD FINDINGS Brain: No evidence of large-territorial acute infarction. No parenchymal hemorrhage. No mass lesion. No extra-axial collection. No mass effect or midline shift. No hydrocephalus. Basilar cisterns are patent. Vascular: No hyperdense vessel. Skull: No acute fracture or focal lesion. Sinuses/Orbits: Paranasal sinuses and mastoid air cells are clear. The orbits are unremarkable. Other: Left occipital scalp 14 mm hematoma formation. CT CERVICAL SPINE FINDINGS Alignment: Normal. Skull base and vertebrae: Multilevel moderate degenerative changes of the spine. No acute fracture. No aggressive appearing focal osseous lesion or focal pathologic  process. Soft tissues and spinal canal: No prevertebral fluid or swelling. No visible canal hematoma. Upper chest: Unremarkable. Other: Atherosclerotic plaque of the carotid arteries within the neck. IMPRESSION: 1. No acute intracranial abnormality. 2. No acute displaced fracture or traumatic listhesis of the cervical spine. 3.  Aortic Atherosclerosis (ICD10-I70.0). Electronically Signed   By: Tish Frederickson M.D.   On: 02/29/2024 13:55   DG Chest  Port 1 View Result Date: 02/29/2024 CLINICAL DATA:  Syncope EXAM: PORTABLE CHEST 1 VIEW COMPARISON:  06/05/2022 FINDINGS: Subtle linear density identified right mid lung. Left lung clear. The cardiopericardial silhouette is within normal limits for size. No acute bony abnormality. IMPRESSION: Subtle linear density right mid lung, potentially atelectasis. CT chest without contrast recommended to exclude pulmonary parenchymal lesion. Electronically Signed   By: Kennith Center M.D.   On: 02/29/2024 13:18    Procedures Procedures    Medications Ordered in ED Medications - No data to display  ED Course/ Medical Decision Making/ A&P                                 Medical Decision Making Amount and/or Complexity of Data Reviewed Labs: ordered. Radiology: ordered. ECG/medicine tests: ordered.  Risk Prescription drug management.   Ultimately this patient had a fall striking her head, it is not clear why she fell but her vital signs are unremarkable and her level of alertness and neurologic exam is unremarkable as well.  With this head injury on Eliquis she will need CT scan imaging to make sure there is no intracranial hematoma or hemorrhage.  Evidently the patient lives in an assisted care and was signed out by her family member today to go to lunch.  There is nothing acute on my exam of concern but she will remain on a cardiac monitor while she is here.  Labs: Unremarkable, chronic mild anemia no changes, no leukocytosis, normal platelets, metabolic  panel without acute findings, preserve renal function and electrolytes.  Imaging: I personally viewed and interpreted the CT scan of the brain and the cervical spine which shows no signs of acute intracranial hemorrhage, subarachnoid hemorrhage epidural hematoma or subdural hematoma, no fracture of the spine, x-ray did show an abnormal finding of the lung.  ED course: I discussed with the family member and the patient at the bedside that there was an abnormality of the x-ray that needs an outpatient CT scan and they are agreeable to the plan.  She is awake alert and has had no seizures, she will be prescribed Zofran for home, encouraged to take Tylenol since she is on Eliquis.  I have discussed with the patient at the bedside the results, and the meaning of these results.  They have had opportunity to ask questions,  expressed their understanding to the need for follow-up with primary care physician        Final Clinical Impression(s) / ED Diagnoses Final diagnoses:  Abnormal x-ray  Concussion without loss of consciousness, initial encounter  Scalp hematoma, initial encounter    Rx / DC Orders ED Discharge Orders          Ordered    ondansetron (ZOFRAN) 4 MG tablet  Every 6 hours        02/29/24 1453              Eber Hong, MD 02/29/24 1454

## 2024-02-29 NOTE — ED Notes (Signed)
 Pt unable to provide a urine sample at this time.

## 2024-02-29 NOTE — ED Triage Notes (Signed)
 Pt states she was standing in line at [facility] ordering food, got dizzy and fell backwards. Pt ht the back of her head, c/o all over pain. Pt is unsure if she takes blood thinners.

## 2024-04-30 ENCOUNTER — Ambulatory Visit: Attending: Internal Medicine | Admitting: Internal Medicine

## 2024-04-30 NOTE — Progress Notes (Signed)
 Erroneous encounter - please disregard.

## 2024-05-16 ENCOUNTER — Encounter (HOSPITAL_COMMUNITY): Payer: Self-pay | Admitting: *Deleted

## 2024-05-16 ENCOUNTER — Emergency Department (HOSPITAL_COMMUNITY)

## 2024-05-16 ENCOUNTER — Other Ambulatory Visit: Payer: Self-pay

## 2024-05-16 ENCOUNTER — Emergency Department (HOSPITAL_COMMUNITY)
Admission: EM | Admit: 2024-05-16 | Discharge: 2024-05-16 | Disposition: A | Discharge status: Home / Self Care | Attending: Emergency Medicine | Admitting: Emergency Medicine

## 2024-05-16 DIAGNOSIS — I2510 Atherosclerotic heart disease of native coronary artery without angina pectoris: Secondary | ICD-10-CM | POA: Insufficient documentation

## 2024-05-16 DIAGNOSIS — N3001 Acute cystitis with hematuria: Secondary | ICD-10-CM | POA: Insufficient documentation

## 2024-05-16 DIAGNOSIS — Z7902 Long term (current) use of antithrombotics/antiplatelets: Secondary | ICD-10-CM | POA: Insufficient documentation

## 2024-05-16 DIAGNOSIS — E1165 Type 2 diabetes mellitus with hyperglycemia: Secondary | ICD-10-CM | POA: Insufficient documentation

## 2024-05-16 DIAGNOSIS — I10 Essential (primary) hypertension: Secondary | ICD-10-CM | POA: Insufficient documentation

## 2024-05-16 DIAGNOSIS — Z79899 Other long term (current) drug therapy: Secondary | ICD-10-CM | POA: Insufficient documentation

## 2024-05-16 DIAGNOSIS — M545 Low back pain, unspecified: Secondary | ICD-10-CM | POA: Diagnosis present

## 2024-05-16 DIAGNOSIS — Z7901 Long term (current) use of anticoagulants: Secondary | ICD-10-CM | POA: Insufficient documentation

## 2024-05-16 DIAGNOSIS — Z7984 Long term (current) use of oral hypoglycemic drugs: Secondary | ICD-10-CM | POA: Diagnosis not present

## 2024-05-16 DIAGNOSIS — Z8673 Personal history of transient ischemic attack (TIA), and cerebral infarction without residual deficits: Secondary | ICD-10-CM | POA: Insufficient documentation

## 2024-05-16 LAB — BASIC METABOLIC PANEL WITH GFR
Anion gap: 14 (ref 5–15)
BUN: 34 mg/dL — ABNORMAL HIGH (ref 8–23)
CO2: 19 mmol/L — ABNORMAL LOW (ref 22–32)
Calcium: 8.8 mg/dL — ABNORMAL LOW (ref 8.9–10.3)
Chloride: 106 mmol/L (ref 98–111)
Creatinine, Ser: 1.01 mg/dL — ABNORMAL HIGH (ref 0.44–1.00)
GFR, Estimated: 58 mL/min — ABNORMAL LOW (ref >60)
Glucose, Bld: 133 mg/dL — ABNORMAL HIGH (ref 70–99)
Potassium: 4.6 mmol/L (ref 3.5–5.1)
Sodium: 139 mmol/L (ref 135–145)

## 2024-05-16 LAB — CBC WITH DIFFERENTIAL/PLATELET
Abs Immature Granulocytes: 0.03 10*3/uL (ref 0.00–0.07)
Basophils Absolute: 0.1 10*3/uL (ref 0.0–0.1)
Basophils Relative: 1 %
Eosinophils Absolute: 0.1 10*3/uL (ref 0.0–0.5)
Eosinophils Relative: 1 %
HCT: 35.7 % — ABNORMAL LOW (ref 36.0–46.0)
Hemoglobin: 11.7 g/dL — ABNORMAL LOW (ref 12.0–15.0)
Immature Granulocytes: 0 %
Lymphocytes Relative: 11 %
Lymphs Abs: 1.3 10*3/uL (ref 0.7–4.0)
MCH: 26.8 pg (ref 26.0–34.0)
MCHC: 32.8 g/dL (ref 30.0–36.0)
MCV: 81.9 fL (ref 80.0–100.0)
Monocytes Absolute: 0.9 10*3/uL (ref 0.1–1.0)
Monocytes Relative: 8 %
Neutro Abs: 8.7 10*3/uL — ABNORMAL HIGH (ref 1.7–7.7)
Neutrophils Relative %: 79 %
Platelets: 314 10*3/uL (ref 150–400)
RBC: 4.36 MIL/uL (ref 3.87–5.11)
RDW: 13.9 % (ref 11.5–15.5)
WBC: 11 10*3/uL — ABNORMAL HIGH (ref 4.0–10.5)
nRBC: 0 % (ref 0.0–0.2)

## 2024-05-16 LAB — URINALYSIS, ROUTINE W REFLEX MICROSCOPIC
Bilirubin Urine: NEGATIVE
Glucose, UA: 50 mg/dL — AB
Ketones, ur: 20 mg/dL — AB
Nitrite: NEGATIVE
Protein, ur: 100 mg/dL — AB
Specific Gravity, Urine: 1.019 (ref 1.005–1.030)
WBC, UA: >50 WBC/hpf (ref 0–5)
pH: 5 (ref 5.0–8.0)

## 2024-05-16 MED ORDER — CEPHALEXIN 500 MG PO CAPS
500.0000 mg | ORAL_CAPSULE | Freq: Once | ORAL | Status: AC
  Administered 2024-05-16: 500 mg via ORAL
  Filled 2024-05-16: qty 1

## 2024-05-16 MED ORDER — SODIUM CHLORIDE 0.9 % IV BOLUS
1000.0000 mL | Freq: Once | INTRAVENOUS | Status: AC
  Administered 2024-05-16: 1000 mL via INTRAVENOUS

## 2024-05-16 MED ORDER — ACETAMINOPHEN 325 MG PO TABS
650.0000 mg | ORAL_TABLET | Freq: Once | ORAL | Status: AC
  Administered 2024-05-16: 650 mg via ORAL
  Filled 2024-05-16: qty 2

## 2024-05-16 MED ORDER — CEPHALEXIN 500 MG PO CAPS: 500.0000 mg | ORAL_CAPSULE | Freq: Four times a day (QID) | ORAL | 0 refills | Status: DC

## 2024-05-16 NOTE — ED Provider Notes (Signed)
[place] EMERGENCY DEPARTMENT AT [facility] Provider Note   CSN: [id number] Arrival date & time: 05/16/24  1826     Patient presents with: Back Pain   Madison Davidson is a 73 y.o. female.    Back Pain Associated symptoms: no abdominal pain, no chest pain, no fever, no headaches, no numbness and no weakness         Madison Davidson is a 73 y.o. female with past medical history of hypertension, coronary artery disease, type 2 diabetes and prior stroke.  [facility] is brought in by EMS for evaluation of lower back pain and low blood sugar.  Patient had initial CBG of 67 at the facility was given D10 and IV fluids, blood sugar improved to 242.  Patient complains of pain of her right lower back and right hip area.  Denies any abdominal pain nausea or vomiting.  No fever or chills.  She does endorse having increased urinary frequency.  No reported fall or recent injury.  Denies any numbness or weakness of her extremities.  Prior to Admission medications   Medication Sig Start Date End Date Taking? Authorizing Provider  acetaminophen  (TYLENOL ) 325 MG tablet Take 2 tablets (650 mg total) by mouth every 6 (six) hours as needed for mild pain, fever or headache. 05/24/21   Angiulli, Toribio PARAS, PA-C  amiodarone  (PACERONE ) 200 MG tablet Take 1 tablet (200 mg total) by mouth 2 (two) times daily for 2 days. 06/07/22 06/09/22  Trudy Anthony HERO, MD  amiodarone  (PACERONE ) 200 MG tablet Take 1 tablet (200 mg total) by mouth daily. Only start this script after completing amiodarone  200mg  BID x 2 days 06/10/22 07/10/22  Trudy Anthony HERO, MD  apixaban  (ELIQUIS ) 5 MG TABS tablet Take 1 tablet (5 mg total) by mouth 2 (two) times daily. 06/07/22 07/07/22  Trudy Anthony HERO, MD  atorvastatin  (LIPITOR ) 20 MG tablet Take 4 tablets (80 mg total) by mouth at bedtime. Patient taking differently: Take 40 mg by mouth in the morning and at bedtime. 2qaM 2HS 05/24/21    Angiulli, Toribio PARAS, PA-C  cholecalciferol (VITAMIN D3) 25 MCG (1000 UNIT) tablet Take 1,000 Units by mouth daily.    [provider]  clopidogrel  (PLAVIX ) 75 MG tablet Take 75 mg by mouth daily. 11/13/21   [provider]  ketoconazole (NIZORAL) 2 % cream 1 Application daily. APPLY TO BUTTOCKS 09/21/21   [provider]  levothyroxine  (SYNTHROID ) 75 MCG tablet Take 75 mcg by mouth daily. 10/30/21   [provider]  lisinopril  (ZESTRIL ) 20 MG tablet Take 1 tablet (20 mg total) by mouth daily. 05/24/21   Angiulli, Toribio PARAS, PA-C  melatonin 3 MG TABS tablet Take 3 mg by mouth at bedtime. 06/08/21   [provider]  metFORMIN  (GLUCOPHAGE ) 1000 MG tablet Take 1 tablet (1,000 mg total) by mouth 2 (two) times daily with a meal. 05/24/21   Angiulli, Toribio PARAS, PA-C  mupirocin ointment (BACTROBAN) 2 % SMARTSIG:1 Application Topical 2-3 Times Daily 11/10/21   [provider]  omeprazole (PRILOSEC) 20 MG capsule Take 20 mg by mouth daily.    [provider]  ondansetron  (ZOFRAN ) 4 MG tablet Take 1 tablet (4 mg total) by mouth every 6 (six) hours. 02/29/24   Cleotilde Rogue, MD  polycarbophil (FIBERCON) 625 MG tablet Take 1 tablet (625 mg total) by mouth 2 (two) times daily. 05/24/21   Angiulli, Toribio PARAS, PA-C  vitamin B-12 (CYANOCOBALAMIN ) 500 MCG  tablet Take 1 tablet (500 mcg total) by mouth daily. Patient not taking: Reported on 06/05/2022 06/14/21   Akula, Vijaya, MD    Allergies: Patient has no known allergies.    Review of Systems  Constitutional:  Positive for appetite change. Negative for chills and fever.  Respiratory:  Negative for cough and shortness of breath.   Cardiovascular:  Negative for chest pain.  Gastrointestinal:  Negative for abdominal pain, constipation, diarrhea, nausea and vomiting.  Genitourinary:  Positive for frequency. Negative for decreased urine volume and difficulty urinating.  Musculoskeletal:  Positive for back pain.   Skin:  Negative for rash and wound.  Neurological:  Negative for weakness, numbness and headaches.    Updated Vital Signs BP (!) 168/85 (BP Location: Left Arm)   Pulse 60   Temp 98 F (36.7 C) (Oral)   Resp 17   Ht 5' (1.524 m)   Wt 83.9 kg   SpO2 98%   BMI 36.13 kg/m   Physical Exam Vitals and nursing note reviewed.  Constitutional:      General: She is not in acute distress.    Appearance: Normal appearance. She is not toxic-appearing.  HENT:     Mouth/Throat:     Mouth: Mucous membranes are moist.   Cardiovascular:     Rate and Rhythm: Normal rate and regular rhythm.     Pulses: Normal pulses.  Pulmonary:     Effort: Pulmonary effort is normal.  Abdominal:     General: There is no distension.     Palpations: Abdomen is soft.     Tenderness: There is no abdominal tenderness. There is no right CVA tenderness or left CVA tenderness.   Musculoskeletal:        General: Tenderness present. No swelling, deformity or signs of injury.     Comments: Diffuse tenderness to palpation lower lumbar paraspinal muscles.  No significant midline tenderness or bony step-offs.  Able to perform SLR bilaterally.  Full range of motion right hip    Skin:    General: Skin is warm.     Capillary Refill: Capillary refill takes less than 2 seconds.   Neurological:     General: No focal deficit present.     Mental Status: She is alert.     Sensory: No sensory deficit.     Motor: No weakness.     (all labs ordered are listed, but only abnormal results are displayed) Labs Reviewed  URINALYSIS, ROUTINE W REFLEX MICROSCOPIC - Abnormal; Notable for the following components:      Result Value   APPearance CLOUDY (*)    Glucose, UA 50 (*)    Hgb urine dipstick SMALL (*)    Ketones, ur 20 (*)    Protein, ur 100 (*)    Leukocytes,Ua LARGE (*)    Bacteria, UA RARE (*)    All other components within normal limits  CBC WITH DIFFERENTIAL/PLATELET - Abnormal; Notable for the following  components:   WBC 11.0 (*)    Hemoglobin 11.7 (*)    HCT 35.7 (*)    Neutro Abs 8.7 (*)    All other components within normal limits  BASIC METABOLIC PANEL WITH GFR - Abnormal; Notable for the following components:   CO2 19 (*)    Glucose, Bld 133 (*)    BUN 34 (*)    Creatinine, Ser 1.01 (*)    Calcium  8.8 (*)    GFR, Estimated 58 (*)    All other components within normal limits  URINE CULTURE    EKG: None  Radiology: CT Renal Stone Study Result Date: 05/16/2024 CLINICAL DATA:  Abdominal pain EXAM: CT ABDOMEN AND PELVIS WITHOUT CONTRAST TECHNIQUE: Multidetector CT imaging of the abdomen and pelvis was performed following the standard protocol without IV contrast. RADIATION DOSE REDUCTION: This exam was performed according to the departmental dose-optimization program which includes automated exposure control, adjustment of the mA and/or kV according to patient size and/or use of iterative reconstruction technique. COMPARISON:  None Available. FINDINGS: Lower chest: No acute abnormality. Hepatobiliary: No focal liver abnormality is seen. No gallstones, gallbladder wall thickening, or biliary dilatation. Pancreas: Unremarkable. No pancreatic ductal dilatation or surrounding inflammatory changes. Spleen: Normal in size without focal abnormality. Adrenals/Urinary Tract: Adrenal glands are within normal limits. Kidneys demonstrate a normal appearance without renal calculi. A few small exophytic cysts are noted which appears simple in nature. No follow-up is recommended. The bladder is partially distended. Stomach/Bowel: The appendix is within normal limits. No obstructive or inflammatory changes of the colon are seen. Small bowel and stomach are within normal limits. Vascular/Lymphatic: Aortic atherosclerosis. No enlarged abdominal or pelvic lymph nodes. Reproductive: Status post hysterectomy. No adnexal masses. Other: No abdominal wall hernia or abnormality. No abdominopelvic ascites.  Musculoskeletal: No acute or significant osseous findings. IMPRESSION: No acute abnormality to correspond with the given clinical history Electronically Signed   By: Oneil Devonshire M.D.   On: 05/16/2024 20:44     Procedures   Medications Ordered in the ED - No data to display                                  Medical Decision Making [facility] for evaluation of low back pain and hyperglycemia.  History of type 2 diabetes, was given D5 by EMS and blood sugar improved to greater than 200.  Patient's only complaint at this time is low back pain with pain radiating into her hip.  She denies any known injury or fall.  She does also endorse some creased urinary frequency.  No reported fever chills or abdominal pain.  Patient well-appearing on my exam, vital signs reassuring.  Will check labs and CT renal stone study  UTI, musculoskeletal pain, kidney stone, pyelonephritis all considered  Amount and/or Complexity of Data Reviewed Labs: ordered.    Details: Labs no significant leukocytosis, no evidence of AKI, urinalysis shows cloudy urine with large leukocytes and greater than 50 white cells with rare bacteria.  11-20 squamous cells also present.  Possible contaminant.  Urine culture pending Radiology: ordered.    Details: CT stone study without evidence of acute finding Discussion of management or test interpretation with external provider(s): Patient with some possible mild dehydration, given IV fluids here.  Was able to provide urine sample without In-N-Out cath.  Patient having some dysuria symptoms, her urinalysis appears to be contaminated but given that she is symptomatic we will treat with antibiotics, urine culture is pending.  I feel that she is appropriate for discharge back to the facility, I have encouraged water intake and PCP follow-up in 1 week for recheck.  Risk OTC drugs. Prescription drug management.        Final diagnoses:   Acute cystitis with hematuria    ED Discharge Orders     None          Madison Davidson 05/16/24 2307    Dean Clarity, MD  05/17/24 1512  

## 2024-05-16 NOTE — ED Notes (Signed)
 Pt's family asked, when can she have some pain medicine. Pt herself has not asked for any medicine and also denied pain when asked.SABRASABRASABRA

## 2024-05-16 NOTE — Discharge Instructions (Signed)
 Her workup this evening shows she likely has a urinary tract infection.  She also appeared to be mildly dehydrated.  She was given IV fluids here to help with that.  Is important that she takes the antibiotic as directed until finished.  Please drink plenty of water.  Follow-up with your primary care provider for recheck in 1 week.

## 2024-05-16 NOTE — ED Notes (Signed)
Pt was able to provide urine sample

## 2024-05-16 NOTE — ED Notes (Addendum)
 Pt lying in bed on bedpan at this time. Pt A&Ox4, denying any pain.

## 2024-05-16 NOTE — ED Triage Notes (Signed)
 Pt BIB [facility] for lower back pain from [facility]. CBG 67 initially, D10 started and received 150 cc.  CBG repeated and was 242.  Pt reports she did not eat breakfast today, reported by EMS that pt's bed was soaked, pt was cleaned and had to change her shirt.  Pt smells strongly of urine.

## 2024-05-19 LAB — URINE CULTURE: Culture: >=100000 — AB

## 2024-05-20 ENCOUNTER — Telehealth (HOSPITAL_BASED_OUTPATIENT_CLINIC_OR_DEPARTMENT_OTHER): Payer: Self-pay

## 2024-05-20 NOTE — Telephone Encounter (Signed)
 Post ED Visit - Positive Culture Follow-up  Culture report reviewed by antimicrobial stewardship pharmacist: [facility] [x]  [place], [place].D. []  Venetia Gully, [address].D., BCPS AQ-ID []  Garrel Crews, Pharm.D., BCPS []  Almarie Lunger, [address].D., BCPS []  [place], [place].D., BCPS, AAHIVP []  Rosaline Bihari, Pharm.D., BCPS, AAHIVP []  Vernell Meier, PharmD, BCPS []  Latanya Hint, PharmD, BCPS []  Donald Medley, PharmD, BCPS []  Rocky Bold, PharmD []  Dorothyann Alert, PharmD, BCPS []  Morene Babe, PharmD  [facility] []  Rosaline Edison, PharmD []  Romona Bliss, PharmD []  Dolphus Roller, PharmD []  Veva Seip, Rph []  Vernell Daunt) Leonce, PharmD []  Eva Allis, PharmD []  Rosaline Millet, PharmD []  Iantha Batch, PharmD []  Arvin Gauss, PharmD []  Wanda Hasting, PharmD []  Ronal Rav, PharmD []  Rocky Slade, PharmD []  Bard Jeans, PharmD   Positive urine culture Treated with Cephalexin , organism sensitive to the same and no further patient follow-up is required at this time.  Madison Davidson 05/20/2024, 10:15 AM

## 2024-07-28 ENCOUNTER — Ambulatory Visit: Attending: Internal Medicine | Admitting: Internal Medicine

## 2024-07-28 ENCOUNTER — Encounter: Payer: Self-pay | Admitting: Internal Medicine

## 2024-07-28 VITALS — BP 100/62 | HR 84 | Ht 62.0 in | Wt 145.2 lb

## 2024-07-28 DIAGNOSIS — I480 Paroxysmal atrial fibrillation: Secondary | ICD-10-CM | POA: Diagnosis present

## 2024-07-28 DIAGNOSIS — Z7901 Long term (current) use of anticoagulants: Secondary | ICD-10-CM | POA: Diagnosis present

## 2024-07-28 MED ORDER — ATORVASTATIN CALCIUM 40 MG PO TABS: 40.0000 mg | ORAL_TABLET | Freq: Every day | ORAL | Status: AC

## 2024-07-28 NOTE — Patient Instructions (Addendum)
 Medication Instructions:  Your physician has recommended you make the following change in your medication:  Stop Taking Plavix  Start taking Eliquis  5 mg twice daily Continue taking all other medications as prescribed   Labwork: None  Testing/Procedures: None  Follow-Up: Your physician recommends that you schedule a follow-up appointment in: 1 year. You will receive a reminder call in about 8 months reminding you to schedule your appointment. If you don't receive this call, please contact our office.   Any Other Special Instructions Will Be Listed Below (If Applicable). Medication will be sent in by facility to their pharmacy   Thank you for choosing [facility]!     If you need a refill on your cardiac medications before your next appointment, please call your pharmacy.

## 2024-07-28 NOTE — Progress Notes (Unsigned)
 Cardiology Office Note  Date: 07/28/2024   ID: Madison Davidson, DOB 06/24/1951, MRN [id number]  PCP:  System, Provider Not In  Cardiologist:  Mykah Shin P Saprina Chuong, MD Electrophysiologist:  None   History of Present Illness: Madison Davidson is a 73 y.o. female  Referred to cardiology clinic for evaluation of paroxysmal atrial fibrillation.  Prior records reviewed.  Lives in the facility.  Patient has been asymptomatic.  No angina or DOE.  No dizziness, syncope, leg swelling.  She had 2 falls in August 2025.  Past Medical History:  Diagnosis Date   Cancer (HCC)    skin   Coronary artery disease    Diabetes mellitus without complication (HCC)    Hypertension    Stroke Procedure [facility])     Past Surgical History:  Procedure Laterality Date   ABDOMINAL HYSTERECTOMY     IR ANGIO INTRA EXTRACRAN SEL INTERNAL CAROTID BILAT MOD SED  05/11/2021   IR ANGIO VERTEBRAL SEL VERTEBRAL UNI R MOD SED  05/11/2021   IR ANGIOGRAM EXTREMITY LEFT  05/11/2021   IR CT HEAD LTD  05/11/2021   IR INTRA CRAN STENT  05/11/2021   IR INTRA CRAN STENT  05/12/2021   IR US  GUIDE VASC ACCESS RIGHT  05/11/2021   RADIOLOGY WITH ANESTHESIA N/A 05/11/2021   Procedure: IR WITH ANESTHESIA - INTRACRANIAL STENT;  Surgeon: de Macedo Rodrigues, Katyucia, MD;  Location: [facility] OR;  Service: Radiology;  Laterality: N/A;    Current Outpatient Medications  Medication Sig Dispense Refill   acetaminophen  (TYLENOL ) 325 MG tablet Take 2 tablets (650 mg total) by mouth every 6 (six) hours as needed for mild pain, fever or headache.     amiodarone  (PACERONE ) 200 MG tablet Take 1 tablet (200 mg total) by mouth 2 (two) times daily for 2 days. 4 tablet 0   amiodarone  (PACERONE ) 200 MG tablet Take 1 tablet (200 mg total) by mouth daily. Only start this script after completing amiodarone  200mg  BID x 2 days 30 tablet 0   apixaban  (ELIQUIS ) 5 MG TABS tablet Take 1 tablet (5 mg total) by mouth 2 (two) times daily. 60 tablet 0   atorvastatin   (LIPITOR ) 20 MG tablet Take 4 tablets (80 mg total) by mouth at bedtime. (Patient taking differently: Take 40 mg by mouth in the morning and at bedtime. 2qaM 2HS) 320 tablet 0   cephALEXin  (KEFLEX ) 500 MG capsule Take 1 capsule (500 mg total) by mouth 4 (four) times daily. 28 capsule 0   cholecalciferol (VITAMIN D3) 25 MCG (1000 UNIT) tablet Take 1,000 Units by mouth daily.     clopidogrel  (PLAVIX ) 75 MG tablet Take 75 mg by mouth daily.     ketoconazole (NIZORAL) 2 % cream 1 Application daily. APPLY TO BUTTOCKS     levothyroxine  (SYNTHROID ) 75 MCG tablet Take 75 mcg by mouth daily.     lisinopril  (ZESTRIL ) 20 MG tablet Take 1 tablet (20 mg total) by mouth daily. 30 tablet 0   melatonin 3 MG TABS tablet Take 3 mg by mouth at bedtime.     metFORMIN  (GLUCOPHAGE ) 1000 MG tablet Take 1 tablet (1,000 mg total) by mouth 2 (two) times daily with a meal. 60 tablet 0   mupirocin ointment (BACTROBAN) 2 % SMARTSIG:1 Application Topical 2-3 Times Daily     omeprazole (PRILOSEC) 20 MG capsule Take 20 mg by mouth daily.     ondansetron  (ZOFRAN ) 4 MG tablet Take 1 tablet (4 mg total) by mouth every 6 (six) hours. 12  tablet 0   polycarbophil (FIBERCON) 625 MG tablet Take 1 tablet (625 mg total) by mouth 2 (two) times daily. 60 tablet 0   vitamin B-12 (CYANOCOBALAMIN ) 500 MCG tablet Take 1 tablet (500 mcg total) by mouth daily. (Patient not taking: Reported on 06/05/2022) 30 tablet 3   No current facility-administered medications for this visit.   Allergies:  Augmentin [amoxicillin-pot clavulanate], Doxycycline, Lisinopril , and Norvasc [amlodipine]   Social History: The patient  reports that she has never smoked. She has never used smokeless tobacco. She reports that she does not drink alcohol and does not use drugs.   Family History: The patient's family history includes Heart disease in her father and mother.   ROS:  Please see the history of present illness. Otherwise, complete review of systems is positive  for none  All other systems are reviewed and negative.   Physical Exam: VS:  Ht 5' 2 (1.575 m)   Wt 145 lb 3.2 oz (65.9 kg)   BMI 26.56 kg/m , BMI Body mass index is 26.56 kg/m.  Wt Readings from Last 3 Encounters:  07/28/24 145 lb 3.2 oz (65.9 kg)  05/16/24 185 lb (83.9 kg)  02/29/24 190 lb (86.2 kg)    General: Patient appears comfortable at rest. HEENT: Conjunctiva and lids normal, oropharynx clear with moist mucosa. Neck: Supple, no elevated JVP or carotid bruits, no thyromegaly. Lungs: Clear to auscultation, nonlabored breathing at rest. Cardiac: Regular rate and rhythm, no S3 or significant systolic murmur, no pericardial rub. Abdomen: Soft, nontender, no hepatomegaly, bowel sounds present, no guarding or rebound. Extremities: No pitting edema, distal pulses 2+. Skin: Warm and dry. Musculoskeletal: No kyphosis. Neuropsychiatric: Alert and oriented x3, affect grossly appropriate.  Recent Labwork: 02/29/2024: ALT 16; AST 19 05/16/2024: BUN 34; Creatinine, Ser 1.01; Hemoglobin 11.7; Platelets 314; Potassium 4.6; Sodium 139     Component Value Date/Time   CHOL 198 05/09/2021 0519   TRIG 220 (H) 05/09/2021 0519   HDL 35 (L) 05/09/2021 0519   CHOLHDL 5.7 05/09/2021 0519   VLDL 44 (H) 05/09/2021 0519   LDLCALC 119 (H) 05/09/2021 0519    Assessment and Plan:  Paroxysmal atrial fibrillation - New onset in 2023 requiring hospital admission. - Discharged on amiodarone , expired, will not renew. - Not on any rate controlling agents. - She did have 2 falls in August 2025, due to balance issues.  Okay to continue Eliquis .  Will need to hold Eliquis  if falls become more frequent, 1-2 times per week. - Refill Eliquis  5 mg twice daily.  Acute right frontal lobe stroke s/p IR guided stent placement of right distal MCA in 2022 - On Plavix  75 mg once daily, will discontinue as she will need Eliquis  - Continue atorvastatin  40 mg at bedtime.  HLD, at goal - Continue atorvastatin  40  mg at bedtime.  Goal LDL less than 70. - Lipid panel from 10/2022 reviewed, LDL 62 and TG 133, both within normal limits.  HTN, controlled - Continue lisinopril  20 mg once daily.   I spent 40 minutes reviewing the prior records, imaging, other labs, discussion of the above problems and documentation.     Medication Adjustments/Labs and Tests Ordered: Current medicines are reviewed at length with the patient today.  Concerns regarding medicines are outlined above.    Disposition:  Follow up 1 year  Signed Sophina Mitten Priya Devanie Galanti, MD, 07/28/2024 2:56 PM    [facility] [address] [place], [place], [place] [address]

## 2024-08-04 ENCOUNTER — Emergency Department (HOSPITAL_COMMUNITY)

## 2024-08-04 ENCOUNTER — Other Ambulatory Visit: Payer: Self-pay

## 2024-08-04 ENCOUNTER — Encounter (HOSPITAL_COMMUNITY): Payer: Self-pay | Admitting: Emergency Medicine

## 2024-08-04 ENCOUNTER — Emergency Department (HOSPITAL_COMMUNITY)
Admission: EM | Admit: 2024-08-04 | Discharge: 2024-08-04 | Attending: Emergency Medicine | Admitting: Emergency Medicine

## 2024-08-04 DIAGNOSIS — W06XXXA Fall from bed, initial encounter: Secondary | ICD-10-CM | POA: Diagnosis not present

## 2024-08-04 DIAGNOSIS — Z7901 Long term (current) use of anticoagulants: Secondary | ICD-10-CM | POA: Diagnosis not present

## 2024-08-04 DIAGNOSIS — W19XXXA Unspecified fall, initial encounter: Secondary | ICD-10-CM

## 2024-08-04 DIAGNOSIS — S8991XA Unspecified injury of right lower leg, initial encounter: Secondary | ICD-10-CM | POA: Diagnosis present

## 2024-08-04 DIAGNOSIS — S8001XA Contusion of right knee, initial encounter: Secondary | ICD-10-CM | POA: Insufficient documentation

## 2024-08-04 DIAGNOSIS — S0990XA Unspecified injury of head, initial encounter: Secondary | ICD-10-CM | POA: Diagnosis not present

## 2024-08-04 NOTE — ED Notes (Signed)
 Attempted to call [facility] twice for discharge report on pt, unable to reach anyone at this time

## 2024-08-04 NOTE — ED Triage Notes (Signed)
 Pt arrived via [facility] from [facility] c/o a fall while getting out of bed, pt is on blood thinners and pt did hit her head, L side, c/o head pain 5/10 and some dizziness. R knee contusion. Denies LOC

## 2024-08-04 NOTE — ED Provider Notes (Signed)
  EMERGENCY DEPARTMENT AT [facility] Provider Note   CSN: [id number] Arrival date & time: 08/04/24  0255     Patient presents with: Madison Davidson is a 73 y.o. female.   Patient states she was getting out of bed and fell hitting the top of her head and also hurting her right knee in the process. On anticoagulation. No syncope. No neurologic symptoms. No injuries elsewhere.    Fall       Prior to Admission medications   Medication Sig Start Date End Date Taking? Authorizing Provider  acetaminophen  (TYLENOL ) 325 MG tablet Take 2 tablets (650 mg total) by mouth every 6 (six) hours as needed for mild pain, fever or headache. 05/24/21   Angiulli, Toribio PARAS, PA-C  apixaban  (ELIQUIS ) 5 MG TABS tablet Take 1 tablet (5 mg total) by mouth 2 (two) times daily. Patient not taking: Reported on 07/28/2024 06/07/22 07/07/22  Trudy Anthony HERO, MD  atorvastatin  (LIPITOR ) 40 MG tablet Take 1 tablet (40 mg total) by mouth daily. 07/28/24   Mallipeddi, Vishnu P, MD  BELSOMRA 5 MG TABS Take 0.5 tablets by mouth at bedtime as needed. 06/21/24   [provider]  cholecalciferol (VITAMIN D3) 25 MCG (1000 UNIT) tablet Take 1,000 Units by mouth daily.    [provider]  dextromethorphan-guaiFENesin (GERI-TUSSIN DM) 10-100 MG/5ML liquid Take 10 mLs by mouth every 4 (four) hours as needed for cough.    [provider]  divalproex (DEPAKOTE) 125 MG DR tablet Take 125 mg by mouth 2 (two) times daily. 06/08/24   [provider]  ferrous sulfate 325 (65 FE) MG tablet Take 325 mg by mouth as directed. On Monday, Wednesday, Friday    [provider]  levothyroxine  (SYNTHROID ) 50 MCG tablet Take 50 mcg by mouth every other day.    [provider]  levothyroxine  (SYNTHROID ) 75 MCG tablet Take 75 mcg by mouth every other day. 10/30/21   [provider]  lisinopril  (ZESTRIL ) 20 MG tablet Take 1 tablet (20 mg total) by mouth daily.  05/24/21   Angiulli, Toribio PARAS, PA-C  metFORMIN  (GLUCOPHAGE ) 1000 MG tablet Take 1 tablet (1,000 mg total) by mouth 2 (two) times daily with a meal. 05/24/21   Angiulli, Toribio PARAS, PA-C  omeprazole (PRILOSEC) 20 MG capsule Take 20 mg by mouth daily.    [provider]  Saline (AYR SALINE NASAL DROPS) 0.65 % SOLN Place 1 spray into the nose 2 (two) times daily as needed.    [provider]  sertraline (ZOLOFT) 25 MG tablet Take 25 mg by mouth daily.    [provider]    Allergies: Augmentin [amoxicillin-pot clavulanate], Doxycycline, Lisinopril , and Norvasc [amlodipine]    Review of Systems  Updated Vital Signs BP (!) 156/54   Pulse (!) 54   Temp 98.5 F (36.9 C) (Oral)   Resp 12   SpO2 98%   Physical Exam Vitals and nursing note reviewed.  Constitutional:      Appearance: She is well-developed.  HENT:     Head: Normocephalic and atraumatic.  Cardiovascular:     Rate and Rhythm: Normal rate and regular rhythm.  Pulmonary:     Effort: No respiratory distress.     Breath sounds: No stridor.  Abdominal:     General: There is no distension.  Musculoskeletal:     Cervical back: Normal range of motion.  Neurological:     General: No focal deficit present.  Mental Status: She is alert.     Comments: No altered mental status, able to give full seemingly accurate history.  Face is symmetric, EOM's intact, pupils equal and reactive, vision intact, tongue and uvula midline without deviation. Upper and Lower extremity motor 5/5, intact pain perception in distal extremities, 2+ reflexes in biceps, patella and achilles tendons. Able to perform finger to nose normal with both hands. Walks without assistance or evident ataxia.       (all labs ordered are listed, but only abnormal results are displayed) Labs Reviewed - No data to display  EKG: EKG Interpretation Date/Time:  Tuesday August 04 2024 03:17:41 EDT Ventricular Rate:  57 PR Interval:  146 QRS  Duration:  96 QT Interval:  492 QTC Calculation: 480 R Axis:   32  Text Interpretation: Sinus rhythm Nonspecific T abnormalities, diffuse leads Confirmed by Lorette Mayo [phone]) on 08/04/2024 3:19:37 AM  Radiology: [facility] Femur Min 2 Views Right Result Date: 08/04/2024 CLINICAL DATA:  73 year old female status post fall from bed. On blood thinners. Struck left side of head with pain and dizziness. EXAM: RIGHT FEMUR 2 VIEWS COMPARISON:  Right femur series 03/18/2009. FINDINGS: Four views of the right femur 0347 hours. Nonobstructed visible bowel gas pattern in the lower abdomen and pelvis. Right femoral head normally located. Visible right hemipelvis appears intact. Intact proximal right femur with some heterotopic ossification adjacent to the greater trochanter. Mid and distal right femur appear intact. Bone mineralization is within normal limits for age. Maintained alignment at the right knee. No evidence of right knee joint effusion. Calcified right femoral artery atherosclerosis. IMPRESSION: 1. No acute fracture or dislocation identified about the right femur. 2. Calcified right femoral artery atherosclerosis. Electronically Signed   By: VEAR Hurst M.D.   On: 08/04/2024 04:21   CT Head Wo Contrast Result Date: 08/04/2024 CLINICAL DATA:  73 year old female status post fall from bed. On blood thinners. Struck left side of head with pain and dizziness. EXAM: CT HEAD WITHOUT CONTRAST TECHNIQUE: Contiguous axial images were obtained from the base of the skull through the vertex without intravenous contrast. RADIATION DOSE REDUCTION: This exam was performed according to the departmental dose-optimization program which includes automated exposure control, adjustment of the mA and/or kV according to patient size and/or use of iterative reconstruction technique. COMPARISON:  Brain MRI 01/24/2022.  Head CT 02/29/2024. FINDINGS: Brain: Stable cerebral volume. Chronic left basal ganglia infarct. Patchy chronic right  frontal lobe white matter gliosis. Stable gray-white matter differentiation throughout the brain. No midline shift, ventriculomegaly, mass effect, evidence of mass lesion, intracranial hemorrhage or evidence of cortically based acute infarction. Vascular: Right MCA vascular stent redemonstrated. Calcified atherosclerosis at the skull base. No suspicious intracranial vascular hyperdensity. Skull: Stable, intact.  No acute osseous abnormality identified. Sinuses/Orbits: Visualized paranasal sinuses and mastoids are stable and well aerated. Other: Resolved left posterior vertex scalp hematoma seen in April. No new orbit or scalp soft tissue injury identified. IMPRESSION: 1. No acute intracranial abnormality or acute traumatic injury identified. 2. Chronic cerebral ischemic disease and right MCA vascular stent. Electronically Signed   By: VEAR Hurst M.D.   On: 08/04/2024 04:01     Procedures   Medications Ordered in the ED - No data to display                                  Medical Decision Making Amount and/or Complexity of Data Reviewed Radiology:  ordered.   No traumatic injury on imaging. Stable for discharge with PCP fu as needed.      Final diagnoses:  Fall, initial encounter  Injury of head, initial encounter  Hematoma of right knee region    ED Discharge Orders     None          Adolph Clutter, Selinda, MD 08/04/24 [phone]

## 2024-08-04 NOTE — ED Notes (Signed)
 Transport here to take to take pt back to facility

## 2024-08-08 ENCOUNTER — Emergency Department (HOSPITAL_COMMUNITY)

## 2024-08-08 ENCOUNTER — Emergency Department (HOSPITAL_COMMUNITY)
Admission: EM | Admit: 2024-08-08 | Discharge: 2024-08-09 | Disposition: A | Discharge status: Skilled Nursing Facility | Source: Skilled Nursing Facility | Attending: Emergency Medicine | Admitting: Emergency Medicine

## 2024-08-08 ENCOUNTER — Other Ambulatory Visit: Payer: Self-pay

## 2024-08-08 ENCOUNTER — Encounter (HOSPITAL_COMMUNITY): Payer: Self-pay

## 2024-08-08 DIAGNOSIS — W010XXA Fall on same level from slipping, tripping and stumbling without subsequent striking against object, initial encounter: Secondary | ICD-10-CM | POA: Diagnosis not present

## 2024-08-08 DIAGNOSIS — S5001XA Contusion of right elbow, initial encounter: Secondary | ICD-10-CM | POA: Insufficient documentation

## 2024-08-08 DIAGNOSIS — S0990XA Unspecified injury of head, initial encounter: Secondary | ICD-10-CM | POA: Insufficient documentation

## 2024-08-08 DIAGNOSIS — S5011XA Contusion of right forearm, initial encounter: Secondary | ICD-10-CM | POA: Insufficient documentation

## 2024-08-08 DIAGNOSIS — Z7901 Long term (current) use of anticoagulants: Secondary | ICD-10-CM | POA: Insufficient documentation

## 2024-08-08 DIAGNOSIS — M25521 Pain in right elbow: Secondary | ICD-10-CM | POA: Diagnosis present

## 2024-08-08 DIAGNOSIS — Z23 Encounter for immunization: Secondary | ICD-10-CM | POA: Insufficient documentation

## 2024-08-08 DIAGNOSIS — W19XXXA Unspecified fall, initial encounter: Secondary | ICD-10-CM

## 2024-08-08 DIAGNOSIS — Z8673 Personal history of transient ischemic attack (TIA), and cerebral infarction without residual deficits: Secondary | ICD-10-CM | POA: Insufficient documentation

## 2024-08-08 LAB — CBG MONITORING, ED
Glucose-Capillary: 62 mg/dL — ABNORMAL LOW (ref 70–99)
Glucose-Capillary: 65 mg/dL — ABNORMAL LOW (ref 70–99)

## 2024-08-08 MED ORDER — TETANUS-DIPHTH-ACELL PERTUSSIS 5-2.5-18.5 LF-MCG/0.5 IM SUSY
0.5000 mL | PREFILLED_SYRINGE | Freq: Once | INTRAMUSCULAR | Status: AC
  Administered 2024-08-08: 0.5 mL via INTRAMUSCULAR
  Filled 2024-08-08: qty 0.5

## 2024-08-08 NOTE — ED Notes (Signed)
 Pt given juice and some crackers.

## 2024-08-08 NOTE — ED Provider Notes (Signed)
 [place] EMERGENCY DEPARTMENT AT [facility] Provider Note   CSN: [id number] Arrival date & time: 08/08/24  2134     Patient presents with: No chief complaint on file.   Madison Davidson is a 73 y.o. female.   Patient is at a nursing home.  She was found on the floor.  Patient is on Eliquis  for strokes.  Patient states that she slipped and fell and did not hit her head.  She complains of pain to her right elbow  The history is provided by the patient and medical records. No language interpreter was used.  Fall This is a new problem. The current episode started less than 1 hour ago. The problem occurs rarely. The problem has been resolved. Pertinent negatives include no abdominal pain and no headaches. Nothing aggravates the symptoms. Nothing relieves the symptoms. She has tried nothing for the symptoms.       Prior to Admission medications   Medication Sig Start Date End Date Taking? Authorizing Provider  acetaminophen  (TYLENOL ) 325 MG tablet Take 2 tablets (650 mg total) by mouth every 6 (six) hours as needed for mild pain, fever or headache. 05/24/21   Angiulli, Toribio PARAS, PA-C  apixaban  (ELIQUIS ) 5 MG TABS tablet Take 1 tablet (5 mg total) by mouth 2 (two) times daily. Patient not taking: Reported on 07/28/2024 06/07/22 07/07/22  Trudy Anthony HERO, MD  atorvastatin  (LIPITOR ) 40 MG tablet Take 1 tablet (40 mg total) by mouth daily. 07/28/24   Mallipeddi, Vishnu P, MD  BELSOMRA 5 MG TABS Take 0.5 tablets by mouth at bedtime as needed. 06/21/24   [provider]  cholecalciferol (VITAMIN D3) 25 MCG (1000 UNIT) tablet Take 1,000 Units by mouth daily.    [provider]  dextromethorphan-guaiFENesin (GERI-TUSSIN DM) 10-100 MG/5ML liquid Take 10 mLs by mouth every 4 (four) hours as needed for cough.    [provider]  divalproex (DEPAKOTE) 125 MG DR tablet Take 125 mg by mouth 2 (two) times daily. 06/08/24   [provider]  ferrous sulfate 325  (65 FE) MG tablet Take 325 mg by mouth as directed. On Monday, Wednesday, Friday    [provider]  levothyroxine  (SYNTHROID ) 50 MCG tablet Take 50 mcg by mouth every other day.    [provider]  levothyroxine  (SYNTHROID ) 75 MCG tablet Take 75 mcg by mouth every other day. 10/30/21   [provider]  lisinopril  (ZESTRIL ) 20 MG tablet Take 1 tablet (20 mg total) by mouth daily. 05/24/21   Angiulli, Toribio PARAS, PA-C  metFORMIN  (GLUCOPHAGE ) 1000 MG tablet Take 1 tablet (1,000 mg total) by mouth 2 (two) times daily with a meal. 05/24/21   Angiulli, Toribio PARAS, PA-C  omeprazole (PRILOSEC) 20 MG capsule Take 20 mg by mouth daily.    [provider]  Saline (AYR SALINE NASAL DROPS) 0.65 % SOLN Place 1 spray into the nose 2 (two) times daily as needed.    [provider]  sertraline (ZOLOFT) 25 MG tablet Take 25 mg by mouth daily.    [provider]    Allergies: Augmentin [amoxicillin-pot clavulanate], Doxycycline, Lisinopril , and Norvasc [amlodipine]    Review of Systems  Constitutional:  Negative for appetite change and fatigue.  HENT:  Negative for congestion, ear discharge and sinus pressure.   Eyes:  Negative for discharge.  Respiratory:  Negative for cough.   Gastrointestinal:  Negative for abdominal pain and diarrhea.  Genitourinary:  Negative for frequency and hematuria.  Musculoskeletal:  Negative for back pain.       Pain right elbow  Skin:  Negative for rash.  Neurological:  Negative for seizures and headaches.  Psychiatric/Behavioral:  Negative for hallucinations.     Updated Vital Signs BP (!) 127/36   Pulse 60   Temp 98.1 F (36.7 C) (Oral)   Resp 15   SpO2 95%   Physical Exam Vitals and nursing note reviewed.  Constitutional:      Appearance: She is well-developed.  HENT:     Head: Normocephalic.     Nose: Nose normal.  Eyes:     General: No scleral icterus.    Conjunctiva/sclera: Conjunctivae normal.  Neck:      Thyroid: No thyromegaly.  Cardiovascular:     Rate and Rhythm: Normal rate and regular rhythm.     Heart sounds: No murmur heard.    No friction rub. No gallop.  Pulmonary:     Breath sounds: No stridor. No wheezing or rales.  Chest:     Chest wall: No tenderness.  Abdominal:     General: There is no distension.     Tenderness: There is no abdominal tenderness. There is no rebound.  Musculoskeletal:        General: Normal range of motion.     Cervical back: Neck supple.     Comments: Abrasion right elbow  Lymphadenopathy:     Cervical: No cervical adenopathy.  Skin:    Findings: No erythema or rash.  Neurological:     Mental Status: She is alert and oriented to person, place, and time.     Motor: No abnormal muscle tone.     Coordination: Coordination normal.  Psychiatric:        Behavior: Behavior normal.     (all labs ordered are listed, but only abnormal results are displayed) Labs Reviewed  CBG MONITORING, ED    EKG: None  Radiology: DG Forearm Right Result Date: 08/08/2024 CLINICAL DATA:  [id number] Fall [id number] EXAM: RIGHT FOREARM - 2 VIEW COMPARISON:  None Available. FINDINGS: There is no evidence of fracture or other focal bone lesions. Soft tissues are unremarkable. IMPRESSION: Negative. Electronically Signed   By: Morgane  Naveau M.D.   On: 08/08/2024 22:34   CT Head Wo Contrast Result Date: 08/08/2024 CLINICAL DATA:  Head trauma, intracranial arterial injury suspected; Ataxia, cervical trauma EXAM: CT HEAD WITHOUT CONTRAST CT CERVICAL SPINE WITHOUT CONTRAST TECHNIQUE: Multidetector CT imaging of the head and cervical spine was performed following the standard protocol without intravenous contrast. Multiplanar CT image reconstructions of the cervical spine were also generated. RADIATION DOSE REDUCTION: This exam was performed according to the departmental dose-optimization program which includes automated exposure control, adjustment of the mA and/or kV according to  patient size and/or use of iterative reconstruction technique. COMPARISON:  None Available. FINDINGS: CT HEAD FINDINGS Brain: No evidence of large-territorial acute infarction. No parenchymal hemorrhage. No mass lesion. No extra-axial collection. No mass effect or midline shift. No hydrocephalus. Basilar cisterns are patent. Vascular: Right MCA stent noted. No hyperdense vessel. Atherosclerotic calcifications are present within the cavernous internal carotid arteries. Skull: No acute fracture or focal lesion. Sinuses/Orbits: Paranasal sinuses and mastoid air cells are clear. The orbits are unremarkable. Other: None. CT CERVICAL SPINE FINDINGS Alignment: Normal. Skull base and vertebrae: Multilevel mild degenerative change of the spine. No severe osseous neural foraminal or central canal stenosis. No acute fracture. No aggressive appearing focal osseous lesion or focal pathologic process. Soft tissues and spinal canal: No prevertebral  fluid or swelling. No visible canal hematoma. Upper chest: Unremarkable. Other: None. IMPRESSION: 1. No acute intracranial abnormality. 2. No acute displaced fracture or traumatic listhesis of the cervical spine. Electronically Signed   By: Morgane  Naveau M.D.   On: 08/08/2024 22:33   CT Cervical Spine Wo Contrast Result Date: 08/08/2024 CLINICAL DATA:  Head trauma, intracranial arterial injury suspected; Ataxia, cervical trauma EXAM: CT HEAD WITHOUT CONTRAST CT CERVICAL SPINE WITHOUT CONTRAST TECHNIQUE: Multidetector CT imaging of the head and cervical spine was performed following the standard protocol without intravenous contrast. Multiplanar CT image reconstructions of the cervical spine were also generated. RADIATION DOSE REDUCTION: This exam was performed according to the departmental dose-optimization program which includes automated exposure control, adjustment of the mA and/or kV according to patient size and/or use of iterative reconstruction technique. COMPARISON:  None  Available. FINDINGS: CT HEAD FINDINGS Brain: No evidence of large-territorial acute infarction. No parenchymal hemorrhage. No mass lesion. No extra-axial collection. No mass effect or midline shift. No hydrocephalus. Basilar cisterns are patent. Vascular: Right MCA stent noted. No hyperdense vessel. Atherosclerotic calcifications are present within the cavernous internal carotid arteries. Skull: No acute fracture or focal lesion. Sinuses/Orbits: Paranasal sinuses and mastoid air cells are clear. The orbits are unremarkable. Other: None. CT CERVICAL SPINE FINDINGS Alignment: Normal. Skull base and vertebrae: Multilevel mild degenerative change of the spine. No severe osseous neural foraminal or central canal stenosis. No acute fracture. No aggressive appearing focal osseous lesion or focal pathologic process. Soft tissues and spinal canal: No prevertebral fluid or swelling. No visible canal hematoma. Upper chest: Unremarkable. Other: None. IMPRESSION: 1. No acute intracranial abnormality. 2. No acute displaced fracture or traumatic listhesis of the cervical spine. Electronically Signed   By: Morgane  Naveau M.D.   On: 08/08/2024 22:33   DG Pelvis Portable Result Date: 08/08/2024 CLINICAL DATA:  fall EXAM: DG PORTABLE PELVIS COMPARISON:  X-ray pelvis 05/25/2021 FINDINGS: Limited evaluation due to overlapping osseous structures and overlying soft tissues. There is no evidence of pelvic fracture or diastasis. No acute displaced fracture or dislocation of the hips. No pelvic bone lesions are seen. Degenerative changes of the lower lumbar spine. IMPRESSION: Negative for acute traumatic injury. Electronically Signed   By: Morgane  Naveau M.D.   On: 08/08/2024 22:30     Procedures   Medications Ordered in the ED  Tdap (BOOSTRIX ) injection 0.5 mL (0.5 mLs Intramuscular Given 08/08/24 2231)  CT scan of the head and neck unremarkable.                                  Medical Decision Making Amount and/or  Complexity of Data Reviewed Radiology: ordered.  Risk Prescription drug management.   Patient with fall with abrasion and contusion to right elbow.  No need for sutures.     Final diagnoses:  Fall, initial encounter  Contusion of right forearm, initial encounter    ED Discharge Orders     None          Suzette Pac, MD 08/10/24 1203

## 2024-08-08 NOTE — Discharge Instructions (Signed)
 Clean abrasion to right forearm twice a day with soap and water.  Follow-up as needed

## 2024-08-08 NOTE — ED Triage Notes (Signed)
 Pt bib [facility] from the [place] for an unwitnessed fall. Per EMS facility stated they found pt laying in the floor. Pt states she has been feeling weak over the last few days. Denies hitting head and no LOC. Does take blood thinners. Skin tear noted to right forearm Arrives in c collar placed by EMS

## 2024-08-09 NOTE — ED Notes (Signed)
 Notified Dr Melvenia of CBG of 65. No new orders given.

## 2024-08-19 ENCOUNTER — Other Ambulatory Visit: Payer: Self-pay

## 2024-08-19 MED ORDER — APIXABAN 5 MG PO TABS: 5.0000 mg | ORAL_TABLET | Freq: Two times a day (BID) | ORAL | 3 refills | Status: AC

## 2024-08-19 NOTE — Telephone Encounter (Signed)
 Prescription refill request for Eliquis  received. Indication: PAF Last office visit: 07/28/24  LULLA Maywood MD Scr: 1.01 on 05/16/24  Epic Age: 73 Weight: 65.9kg  Based on above findings Eliquis  5mg  twice daily is the appropriate dose.  Refill approved.

## 2024-10-20 ENCOUNTER — Inpatient Hospital Stay (HOSPITAL_COMMUNITY)
Admission: EM | Admit: 2024-10-20 | Discharge: 2024-10-29 | DRG: 689 | Disposition: A | Discharge status: Skilled Nursing Facility | Source: Skilled Nursing Facility | Attending: Family Medicine | Admitting: Family Medicine

## 2024-10-20 ENCOUNTER — Emergency Department (HOSPITAL_COMMUNITY)

## 2024-10-20 ENCOUNTER — Encounter (HOSPITAL_COMMUNITY): Payer: Self-pay | Admitting: Emergency Medicine

## 2024-10-20 ENCOUNTER — Other Ambulatory Visit: Payer: Self-pay

## 2024-10-20 DIAGNOSIS — E860 Dehydration: Secondary | ICD-10-CM

## 2024-10-20 DIAGNOSIS — E162 Hypoglycemia, unspecified: Secondary | ICD-10-CM

## 2024-10-20 DIAGNOSIS — R5383 Other fatigue: Principal | ICD-10-CM | POA: Diagnosis present

## 2024-10-20 DIAGNOSIS — I480 Paroxysmal atrial fibrillation: Secondary | ICD-10-CM | POA: Diagnosis present

## 2024-10-20 DIAGNOSIS — E872 Acidosis, unspecified: Secondary | ICD-10-CM | POA: Insufficient documentation

## 2024-10-20 DIAGNOSIS — N3090 Cystitis, unspecified without hematuria: Secondary | ICD-10-CM

## 2024-10-20 DIAGNOSIS — E870 Hyperosmolality and hypernatremia: Secondary | ICD-10-CM | POA: Insufficient documentation

## 2024-10-20 DIAGNOSIS — I959 Hypotension, unspecified: Secondary | ICD-10-CM

## 2024-10-20 DIAGNOSIS — G9341 Metabolic encephalopathy: Secondary | ICD-10-CM | POA: Insufficient documentation

## 2024-10-20 LAB — CBC
HCT: 35 % — ABNORMAL LOW (ref 36.0–46.0)
Hemoglobin: 10.3 g/dL — ABNORMAL LOW (ref 12.0–15.0)
MCH: 28.2 pg (ref 26.0–34.0)
MCHC: 29.4 g/dL — ABNORMAL LOW (ref 30.0–36.0)
MCV: 95.9 fL (ref 80.0–100.0)
Platelets: 438 K/uL — ABNORMAL HIGH (ref 150–400)
RBC: 3.65 MIL/uL — ABNORMAL LOW (ref 3.87–5.11)
RDW: 14.2 % (ref 11.5–15.5)
WBC: 9.1 K/uL (ref 4.0–10.5)
nRBC: 0.2 % (ref 0.0–0.2)

## 2024-10-20 LAB — COMPREHENSIVE METABOLIC PANEL WITH GFR
ALT: 30 U/L (ref 0–44)
AST: 70 U/L — ABNORMAL HIGH (ref 15–41)
Albumin: 3 g/dL — ABNORMAL LOW (ref 3.5–5.0)
Alkaline Phosphatase: 68 U/L (ref 38–126)
Anion gap: 27 — ABNORMAL HIGH (ref 5–15)
BUN: 37 mg/dL — ABNORMAL HIGH (ref 8–23)
CO2: 11 mmol/L — ABNORMAL LOW (ref 22–32)
Calcium: 8.8 mg/dL — ABNORMAL LOW (ref 8.9–10.3)
Chloride: 108 mmol/L (ref 98–111)
Creatinine, Ser: 1.18 mg/dL — ABNORMAL HIGH (ref 0.44–1.00)
GFR, Estimated: 48 mL/min — ABNORMAL LOW (ref >60)
Glucose, Bld: 110 mg/dL — ABNORMAL HIGH (ref 70–99)
Potassium: 4.9 mmol/L (ref 3.5–5.1)
Sodium: 146 mmol/L — ABNORMAL HIGH (ref 135–145)
Total Bilirubin: 0.4 mg/dL (ref 0.0–1.2)
Total Protein: 6.5 g/dL (ref 6.5–8.1)

## 2024-10-20 LAB — CBG MONITORING, ED
Glucose-Capillary: 57 mg/dL — ABNORMAL LOW (ref 70–99)
Glucose-Capillary: 87 mg/dL (ref 70–99)
Glucose-Capillary: 125 mg/dL — ABNORMAL HIGH (ref 70–99)

## 2024-10-20 MED ORDER — SODIUM CHLORIDE 0.9 % IV BOLUS
500.0000 mL | Freq: Once | INTRAVENOUS | Status: AC
  Administered 2024-10-20: 500 mL via INTRAVENOUS

## 2024-10-20 MED ORDER — DEXTROSE 50 % IV SOLN
INTRAVENOUS | Status: AC
  Filled 2024-10-20: qty 50

## 2024-10-20 MED ORDER — DEXTROSE 50 % IV SOLN
12.5000 g | Freq: Once | INTRAVENOUS | Status: AC
  Administered 2024-10-20: 12.5 g via INTRAVENOUS

## 2024-10-20 MED ORDER — IOHEXOL 350 MG/ML SOLN
100.0000 mL | Freq: Once | INTRAVENOUS | Status: AC | PRN
  Administered 2024-10-21: 100 mL via INTRAVENOUS

## 2024-10-20 MED ORDER — LACTATED RINGERS IV BOLUS
1000.0000 mL | Freq: Once | INTRAVENOUS | Status: AC
  Administered 2024-10-21: 1000 mL via INTRAVENOUS

## 2024-10-20 NOTE — ED Triage Notes (Signed)
 Pt bib [facility], they were called out for pt being lethargic x 2 days. Pt found pt to be hypotension, cool and clammy. Pt was given 500cc of normal saline.

## 2024-10-20 NOTE — ED Provider Notes (Signed)
 [place] EMERGENCY DEPARTMENT AT [facility] Provider Note   CSN: [id number] Arrival date & time: 10/20/24  2126     Patient presents with: Hypotension   Madison Davidson is a 73 y.o. female.   HPI    Presents because of possible lethargy over the past 2 days.  Patient found to be slightly hypotensive by EMS.  Was given 500 cc of fluid.  Glucose was found to be in the 50s.  She states that she has not been hungry over the past couple days.  No nausea vomiting or diarrhea.  No abdominal pain.  No fever no chills.  No chest pain or shortness of breath.  No headaches.  No falls.  Denies any obvious dysuria.  Otherwise, she states that she feels in normal state of health.   Previous medical history reviewed : Was last seen in ED on August 08, 2024 because of fall.  Negative workup.  Patient last admitted in July 2023.  A-fib with RVR.   Prior to Admission medications   Medication Sig Start Date End Date Taking? Authorizing Provider  acetaminophen  (TYLENOL ) 325 MG tablet Take 2 tablets (650 mg total) by mouth every 6 (six) hours as needed for mild pain, fever or headache. 05/24/21   Angiulli, Toribio PARAS, PA-C  apixaban  (ELIQUIS ) 5 MG TABS tablet Take 1 tablet (5 mg total) by mouth 2 (two) times daily. 08/19/24 12/17/24  Mallipeddi, Vishnu P, MD  atorvastatin  (LIPITOR ) 40 MG tablet Take 1 tablet (40 mg total) by mouth daily. 07/28/24   Mallipeddi, Vishnu P, MD  BELSOMRA 5 MG TABS Take 0.5 tablets by mouth at bedtime as needed. 06/21/24   [provider]  cholecalciferol (VITAMIN D3) 25 MCG (1000 UNIT) tablet Take 1,000 Units by mouth daily.    [provider]  dextromethorphan-guaiFENesin (GERI-TUSSIN DM) 10-100 MG/5ML liquid Take 10 mLs by mouth every 4 (four) hours as needed for cough.    [provider]  divalproex  (DEPAKOTE ) 125 MG DR tablet Take 125 mg by mouth 2 (two) times daily. 06/08/24   [provider]  ferrous sulfate  325 (65 FE) MG  tablet Take 325 mg by mouth as directed. On Monday, Wednesday, Friday    [provider]  levothyroxine  (SYNTHROID ) 50 MCG tablet Take 50 mcg by mouth every other day.    [provider]  levothyroxine  (SYNTHROID ) 75 MCG tablet Take 75 mcg by mouth every other day. 10/30/21   [provider]  lisinopril  (ZESTRIL ) 20 MG tablet Take 1 tablet (20 mg total) by mouth daily. 05/24/21   Angiulli, Toribio PARAS, PA-C  metFORMIN  (GLUCOPHAGE ) 1000 MG tablet Take 1 tablet (1,000 mg total) by mouth 2 (two) times daily with a meal. 05/24/21   Angiulli, Toribio PARAS, PA-C  omeprazole (PRILOSEC) 20 MG capsule Take 20 mg by mouth daily.    [provider]  Saline (AYR SALINE NASAL DROPS) 0.65 % SOLN Place 1 spray into the nose 2 (two) times daily as needed.    [provider]  sertraline (ZOLOFT) 25 MG tablet Take 25 mg by mouth daily.    [provider]    Allergies: Augmentin [amoxicillin-pot clavulanate], Doxycycline, Lisinopril , and Norvasc [amlodipine]    Review of Systems  Constitutional:  Negative for chills and fever.  HENT:  Negative for ear pain and sore throat.   Eyes:  Negative for pain and visual disturbance.  Respiratory:  Negative for cough and shortness of breath.   Cardiovascular:  Negative  for chest pain and palpitations.  Gastrointestinal:  Negative for abdominal pain and vomiting.  Genitourinary:  Negative for dysuria and hematuria.  Musculoskeletal:  Negative for arthralgias and back pain.  Skin:  Negative for color change and rash.  Neurological:  Negative for seizures and syncope.  All other systems reviewed and are negative.   Updated Vital Signs BP (!) 107/36   Pulse (!) 103   Temp (!) 96.3 F (35.7 C) (Rectal)   Resp 19   Ht 5' 2 (1.575 m)   Wt 65.9 kg   SpO2 100%   BMI 26.57 kg/m   Physical Exam Vitals and nursing note reviewed.  Constitutional:      General: She is not in acute distress.    Appearance: She is  well-developed.  HENT:     Head: Normocephalic and atraumatic.  Eyes:     Conjunctiva/sclera: Conjunctivae normal.  Cardiovascular:     Rate and Rhythm: Normal rate and regular rhythm.     Heart sounds: No murmur heard. Pulmonary:     Effort: Pulmonary effort is normal. No respiratory distress.     Breath sounds: Normal breath sounds.  Abdominal:     Palpations: Abdomen is soft.     Tenderness: There is no abdominal tenderness.  Musculoskeletal:        General: No swelling.     Cervical back: Neck supple.  Skin:    General: Skin is warm and dry.     Capillary Refill: Capillary refill takes less than 2 seconds.  Neurological:     Mental Status: She is alert.  Psychiatric:        Mood and Affect: Mood normal.     (all labs ordered are listed, but only abnormal results are displayed) Labs Reviewed  COMPREHENSIVE METABOLIC PANEL WITH GFR - Abnormal; Notable for the following components:      Result Value   Sodium 146 (*)    CO2 11 (*)    Glucose, Bld 110 (*)    BUN 37 (*)    Creatinine, Ser 1.18 (*)    Calcium  8.8 (*)    Albumin 3.0 (*)    AST 70 (*)    GFR, Estimated 48 (*)    Anion gap 27 (*)    All other components within normal limits  CBC - Abnormal; Notable for the following components:   RBC 3.65 (*)    Hemoglobin 10.3 (*)    HCT 35.0 (*)    MCHC 29.4 (*)    Platelets 438 (*)    All other components within normal limits  CBG MONITORING, ED - Abnormal; Notable for the following components:   Glucose-Capillary 57 (*)    All other components within normal limits  CBG MONITORING, ED - Abnormal; Notable for the following components:   Glucose-Capillary 125 (*)    All other components within normal limits  URINALYSIS, ROUTINE W REFLEX MICROSCOPIC  TSH  LACTIC ACID, PLASMA  LACTIC ACID, PLASMA  CBG MONITORING, ED    EKG: EKG Interpretation Date/Time:  Tuesday October 20 2024 21:55:27 EST Ventricular Rate:  98 PR Interval:  136 QRS Duration:  90 QT  Interval:  349 QTC Calculation: 446 R Axis:   17  Text Interpretation: Sinus rhythm RSR' in V1 or V2, right VCD or RVH Probable anteroseptal infarct, old Nonspecific T abnormalities, lateral leads Confirmed by Simon Rea [phone]) on 10/20/2024 10:44:21 PM  Radiology: DG Chest Portable 1 View Result Date: 10/20/2024 EXAM: 1 VIEW(S) XRAY OF THE  CHEST 10/20/2024 11:04:00 PM COMPARISON: Comparison with 02/29/2024. CLINICAL HISTORY: eval for pneumonia. eval for pneumonia FINDINGS: LUNGS AND PLEURA: Shallow inspiration. No focal pulmonary opacity. No pleural effusion. No pneumothorax. HEART AND MEDIASTINUM: Calcification of the aorta. BONES AND SOFT TISSUES: Degenerative changes in the spine and shoulders. IMPRESSION: 1. No focal airspace consolidation to suggest pneumonia. Evaluation limited by shallow inspiration. Electronically signed by: Elsie Gravely MD 10/20/2024 11:07 PM EST RP Workstation: [id number]     Procedures   Medications Ordered in the ED  lactated ringers  bolus 1,000 mL (has no administration in time range)  iohexol  (OMNIPAQUE ) 350 MG/ML injection 100 mL (has no administration in time range)  dextrose  50 % solution 12.5 g (12.5 g Intravenous Given 10/20/24 2145)  dextrose  50 % solution (  Given 10/20/24 2148)  sodium chloride  0.9 % bolus 500 mL (0 mLs Intravenous Stopped 10/20/24 2348)                                    Medical Decision Making Amount and/or Complexity of Data Reviewed Labs: ordered. Radiology: ordered.  Risk Prescription drug management.     HPI:      Presents because of possible lethargy over the past 2 days.  Patient found to be slightly hypotensive by EMS.  Was given 500 cc of fluid.  Glucose was found to be in the 50s.  She states that she has not been hungry over the past couple days.  No nausea vomiting or diarrhea.  No abdominal pain.  No fever no chills.  No chest pain or shortness of breath.  No headaches.  No falls.  Denies any obvious  dysuria.  Otherwise, she states that she feels in normal state of health.   Previous medical history reviewed : Was last seen in ED on August 08, 2024 because of fall.  Negative workup.  Patient last admitted in July 2023.  A-fib with RVR.  MDM:   Patient able to tell me name as well as date of birth and location but not exact date.  No focal deficits on neuroexam.  No concerns for CVA.  No evidence of trauma to the head.  No evidence of trauma to the body.  Will obtain electrolyte workup to make sure there is no large electrolyte derangements.  Patient's glucose was in the 50s when she first arrived.  Received half amp of D50 and subsequently glucose of now improved to normal.  Soft benign abdomen.  No rebound guarding or tenderness.  Will obtain basic laboratory workup, chest x-ray, UA.  Reevaluation:   Upon reexamination, patient hemodynamically stable.  Remains A&O x 3 with GCS 15.  Signed out.  Anion gap noted on labs.  Obtaining CT imaging.       Final diagnoses:  Lethargy  Hypoglycemia    ED Discharge Orders     None          Simon Lavonia SAILOR, MD 10/21/24 0001

## 2024-10-20 NOTE — ED Provider Notes (Signed)
 11:37 PM Assumed care from Dr. Simon, please see their note for full history, physical and decision making until this point. In brief this is a 73 y.o. year old female who presented to the ED tonight with Hypotension     Here for lethargy. Soft pressures at the facility. Decreased PO. Hypoglycemic, hypotension, tachy, afebrile, asymptomatic otherwise. Pending completion of workup.   Discharge instructions, including strict return precautions for new or worsening symptoms, given. Patient and/or family verbalized understanding and agreement with the plan as described.   Labs, studies and imaging reviewed by myself and considered in medical decision making if ordered. Imaging interpreted by radiology.  Labs Reviewed  COMPREHENSIVE METABOLIC PANEL WITH GFR - Abnormal; Notable for the following components:      Result Value   Sodium 146 (*)    CO2 11 (*)    Glucose, Bld 110 (*)    BUN 37 (*)    Creatinine, Ser 1.18 (*)    Calcium  8.8 (*)    Albumin 3.0 (*)    AST 70 (*)    GFR, Estimated 48 (*)    Anion gap 27 (*)    All other components within normal limits  CBC - Abnormal; Notable for the following components:   RBC 3.65 (*)    Hemoglobin 10.3 (*)    HCT 35.0 (*)    MCHC 29.4 (*)    Platelets 438 (*)    All other components within normal limits  CBG MONITORING, ED - Abnormal; Notable for the following components:   Glucose-Capillary 57 (*)    All other components within normal limits  CBG MONITORING, ED - Abnormal; Notable for the following components:   Glucose-Capillary 125 (*)    All other components within normal limits  URINALYSIS, ROUTINE W REFLEX MICROSCOPIC  TSH  CBG MONITORING, ED    DG Chest Portable 1 View  Final Result      No follow-ups on file.  Physical Exam  BP (!) 128/23   Pulse (!) 105   Temp (!) 96.3 F (35.7 C) (Rectal)   Resp 16   Ht 5' 2 (1.575 m)   Wt 65.9 kg   SpO2 100%   BMI 26.57 kg/m   Physical Exam Constitutional:       Appearance: Normal appearance.  HENT:     Head: Normocephalic.  Cardiovascular:     Rate and Rhythm: Tachycardia present.  Pulmonary:     Effort: Pulmonary effort is normal.  Skin:    General: Skin is warm and dry.  Neurological:     General: No focal deficit present.     Mental Status: She is alert.     Procedures  .Critical Care  Performed by: Lorette Mayo, MD Authorized by: Lorette Mayo, MD   Critical care provider statement:    Critical care time (minutes):  30   Critical care was necessary to treat or prevent imminent or life-threatening deterioration of the following conditions:  Dehydration   Critical care was time spent personally by me on the following activities:  Development of treatment plan with patient or surrogate, discussions with consultants, evaluation of patient's response to treatment, examination of patient, ordering and review of laboratory studies, ordering and review of radiographic studies, ordering and performing treatments and interventions, pulse oximetry, re-evaluation of patient's condition and review of old charts   ED Course / MDM   Clinical Course as of 10/21/24 0326  Wed Oct 21, 2024  0109 Lactic Acid, Venous(!!): >9.0 Unclear when  this was drawn but very well could be accurate with her large anion gap. Has had a couple liters of fluids since being here, will recheck another now. Also I think she is on metformin  so consider MALA as well. I repositioned patients BP cuff (as it had been crooked and over the top of a thick night gown near the elbow of her left arm) and her pulse pressure improved significantly as did her her MAP, will continue to monitor.  [JM]  0153 Lactic Acid, Venous(!!): 7.0 Will give some more fluids but a little bit slower [JM]    Clinical Course User Index [JM] Dewayne Severe, Selinda, MD   Medical Decision Making Amount and/or Complexity of Data Reviewed Labs: ordered. Decision-making details documented in ED Course. Radiology:  ordered.  Risk Prescription drug management. Decision regarding hospitalization.   D/w Dr. Shona for admission for dehydration w/ possible MALA vs lactic acidemia from shock that is improving.    Darrell Hauk, Selinda, MD 10/21/24 [phone]

## 2024-10-20 NOTE — ED Notes (Signed)
Family updated on pt status

## 2024-10-20 NOTE — ED Notes (Signed)
 Pt was made aware of needing a urine sample. Pt was put on bedpan to collect. Pt unable to urinate at this time. Call bell given and this nurse explained to pt to press big red button when she needs to use the bathroom so staff can collect a sample.

## 2024-10-21 DIAGNOSIS — E870 Hyperosmolality and hypernatremia: Secondary | ICD-10-CM

## 2024-10-21 DIAGNOSIS — G9341 Metabolic encephalopathy: Secondary | ICD-10-CM

## 2024-10-21 DIAGNOSIS — Z66 Do not resuscitate: Secondary | ICD-10-CM | POA: Diagnosis present

## 2024-10-21 DIAGNOSIS — B9620 Unspecified Escherichia coli [E. coli] as the cause of diseases classified elsewhere: Secondary | ICD-10-CM | POA: Diagnosis present

## 2024-10-21 DIAGNOSIS — E872 Acidosis, unspecified: Secondary | ICD-10-CM | POA: Diagnosis not present

## 2024-10-21 DIAGNOSIS — R5383 Other fatigue: Principal | ICD-10-CM

## 2024-10-21 DIAGNOSIS — I484 Atypical atrial flutter: Secondary | ICD-10-CM | POA: Diagnosis present

## 2024-10-21 DIAGNOSIS — E860 Dehydration: Secondary | ICD-10-CM | POA: Diagnosis present

## 2024-10-21 DIAGNOSIS — I480 Paroxysmal atrial fibrillation: Secondary | ICD-10-CM

## 2024-10-21 DIAGNOSIS — I10 Essential (primary) hypertension: Secondary | ICD-10-CM | POA: Diagnosis not present

## 2024-10-21 DIAGNOSIS — E039 Hypothyroidism, unspecified: Secondary | ICD-10-CM | POA: Diagnosis present

## 2024-10-21 DIAGNOSIS — N3090 Cystitis, unspecified without hematuria: Secondary | ICD-10-CM

## 2024-10-21 DIAGNOSIS — D649 Anemia, unspecified: Secondary | ICD-10-CM | POA: Diagnosis present

## 2024-10-21 DIAGNOSIS — N390 Urinary tract infection, site not specified: Secondary | ICD-10-CM | POA: Diagnosis present

## 2024-10-21 DIAGNOSIS — I4819 Other persistent atrial fibrillation: Secondary | ICD-10-CM | POA: Diagnosis present

## 2024-10-21 DIAGNOSIS — E119 Type 2 diabetes mellitus without complications: Secondary | ICD-10-CM | POA: Diagnosis present

## 2024-10-21 LAB — URINALYSIS, ROUTINE W REFLEX MICROSCOPIC
Bilirubin Urine: NEGATIVE
Glucose, UA: NEGATIVE mg/dL
Ketones, ur: 5 mg/dL — AB
Nitrite: NEGATIVE
Protein, ur: 30 mg/dL — AB
Specific Gravity, Urine: 1.027 (ref 1.005–1.030)
WBC, UA: >50 WBC/hpf (ref 0–5)
pH: 5 (ref 5.0–8.0)

## 2024-10-21 LAB — BLOOD GAS, VENOUS
Acid-base deficit: 1.9 mmol/L (ref 0.0–2.0)
Bicarbonate: 23.7 mmol/L (ref 20.0–28.0)
Drawn by: 442
O2 Saturation: 38.8 %
Patient temperature: 36.4
pCO2, Ven: 42 mmHg — ABNORMAL LOW (ref 44–60)
pH, Ven: 7.36 (ref 7.25–7.43)
pO2, Ven: <31 mmHg — CL (ref 32–45)

## 2024-10-21 LAB — COMPREHENSIVE METABOLIC PANEL WITH GFR
ALT: 43 U/L (ref 0–44)
AST: 112 U/L — ABNORMAL HIGH (ref 15–41)
Albumin: 2.7 g/dL — ABNORMAL LOW (ref 3.5–5.0)
Alkaline Phosphatase: 59 U/L (ref 38–126)
Anion gap: 10 (ref 5–15)
BUN: 33 mg/dL — ABNORMAL HIGH (ref 8–23)
CO2: 23 mmol/L (ref 22–32)
Calcium: 8.1 mg/dL — ABNORMAL LOW (ref 8.9–10.3)
Chloride: 109 mmol/L (ref 98–111)
Creatinine, Ser: 0.95 mg/dL (ref 0.44–1.00)
GFR, Estimated: >60 mL/min (ref >60)
Glucose, Bld: 95 mg/dL (ref 70–99)
Potassium: 5.2 mmol/L — ABNORMAL HIGH (ref 3.5–5.1)
Sodium: 142 mmol/L (ref 135–145)
Total Bilirubin: <0.2 mg/dL (ref 0.0–1.2)
Total Protein: 6.1 g/dL — ABNORMAL LOW (ref 6.5–8.1)

## 2024-10-21 LAB — GLUCOSE, CAPILLARY
Glucose-Capillary: 112 mg/dL — ABNORMAL HIGH (ref 70–99)
Glucose-Capillary: 85 mg/dL (ref 70–99)
Glucose-Capillary: 91 mg/dL (ref 70–99)
Glucose-Capillary: 87 mg/dL (ref 70–99)

## 2024-10-21 LAB — TSH: TSH: 4.86 u[IU]/mL — ABNORMAL HIGH (ref 0.350–4.500)

## 2024-10-21 LAB — AMMONIA: Ammonia: 23 umol/L (ref 9–35)

## 2024-10-21 LAB — IRON AND TIBC
Iron: 21 ug/dL — ABNORMAL LOW (ref 28–170)
Saturation Ratios: 13 % (ref 10.4–31.8)
TIBC: 162 ug/dL — ABNORMAL LOW (ref 250–450)
UIBC: 142 ug/dL

## 2024-10-21 LAB — VALPROIC ACID LEVEL: Valproic Acid Lvl: 32 ug/mL — ABNORMAL LOW (ref 50–100)

## 2024-10-21 LAB — MAGNESIUM: Magnesium: 1.1 mg/dL — ABNORMAL LOW (ref 1.7–2.4)

## 2024-10-21 LAB — LACTIC ACID, PLASMA
Lactic Acid, Venous: >9 mmol/L (ref 0.5–1.9)
Lactic Acid, Venous: 7 mmol/L (ref 0.5–1.9)
Lactic Acid, Venous: 1.9 mmol/L (ref 0.5–1.9)

## 2024-10-21 LAB — CORTISOL: Cortisol, Plasma: 17.1 ug/dL

## 2024-10-21 LAB — MRSA NEXT GEN BY PCR, NASAL: MRSA by PCR Next Gen: NOT DETECTED

## 2024-10-21 LAB — CK: Total CK: 227 U/L (ref 38–234)

## 2024-10-21 LAB — FOLATE: Folate: 5.7 ng/mL — ABNORMAL LOW (ref >5.9)

## 2024-10-21 LAB — VITAMIN B12: Vitamin B-12: 331 pg/mL (ref 180–914)

## 2024-10-21 MED ORDER — PROCHLORPERAZINE EDISYLATE 10 MG/2ML IJ SOLN: 5.0000 mg | Freq: Four times a day (QID) | INTRAMUSCULAR | Status: DC | PRN

## 2024-10-21 MED ORDER — LORAZEPAM 2 MG/ML IJ SOLN: 2.0000 mg | Freq: Four times a day (QID) | INTRAMUSCULAR | Status: DC | PRN

## 2024-10-21 MED ORDER — CHLORHEXIDINE GLUCONATE CLOTH 2 % EX PADS
6.0000 | MEDICATED_PAD | Freq: Every day | CUTANEOUS | Status: DC
  Administered 2024-10-21 – 2024-10-29 (×9): 6 via TOPICAL

## 2024-10-21 MED ORDER — CYANOCOBALAMIN 1000 MCG/ML IJ SOLN
1000.0000 ug | Freq: Once | INTRAMUSCULAR | Status: AC
  Administered 2024-10-21: 1000 ug via INTRAMUSCULAR
  Filled 2024-10-21: qty 1

## 2024-10-21 MED ORDER — APIXABAN 5 MG PO TABS
5.0000 mg | ORAL_TABLET | Freq: Two times a day (BID) | ORAL | Status: DC
  Administered 2024-10-21 – 2024-10-29 (×17): 5 mg via ORAL
  Filled 2024-10-21 (×7): qty 1
  Filled 2024-10-21: qty 2
  Filled 2024-10-21 (×9): qty 1

## 2024-10-21 MED ORDER — DIVALPROEX SODIUM 125 MG PO DR TAB
125.0000 mg | DELAYED_RELEASE_TABLET | Freq: Two times a day (BID) | ORAL | Status: DC
  Filled 2024-10-21 (×2): qty 1

## 2024-10-21 MED ORDER — POLYETHYLENE GLYCOL 3350 17 G PO PACK
17.0000 g | PACK | Freq: Every day | ORAL | Status: DC | PRN
  Administered 2024-10-29 (×2): 17 g via ORAL
  Filled 2024-10-21 (×2): qty 1

## 2024-10-21 MED ORDER — INSULIN ASPART 100 UNIT/ML IJ SOLN
0.0000 [IU] | Freq: Three times a day (TID) | INTRAMUSCULAR | Status: DC
  Administered 2024-10-22: 2 [IU] via SUBCUTANEOUS
  Administered 2024-10-23: 1 [IU] via SUBCUTANEOUS
  Administered 2024-10-23: 5 [IU] via SUBCUTANEOUS
  Administered 2024-10-23: 2 [IU] via SUBCUTANEOUS
  Administered 2024-10-24: 3 [IU] via SUBCUTANEOUS
  Administered 2024-10-24 – 2024-10-26 (×5): 1 [IU] via SUBCUTANEOUS
  Administered 2024-10-27: 2 [IU] via SUBCUTANEOUS
  Administered 2024-10-29: 1 [IU] via SUBCUTANEOUS
  Filled 2024-10-21 (×11): qty 1

## 2024-10-21 MED ORDER — ENOXAPARIN SODIUM 40 MG/0.4ML IJ SOSY: 40.0000 mg | PREFILLED_SYRINGE | INTRAMUSCULAR | Status: DC

## 2024-10-21 MED ORDER — FOLIC ACID 1 MG PO TABS
1.0000 mg | ORAL_TABLET | Freq: Every day | ORAL | Status: DC
  Administered 2024-10-21 – 2024-10-29 (×9): 1 mg via ORAL
  Filled 2024-10-21 (×9): qty 1

## 2024-10-21 MED ORDER — VITAMIN B-12 100 MCG PO TABS
500.0000 ug | ORAL_TABLET | Freq: Every day | ORAL | Status: DC
  Administered 2024-10-22 – 2024-10-29 (×7): 500 ug via ORAL
  Filled 2024-10-21 (×7): qty 5

## 2024-10-21 MED ORDER — MELATONIN 3 MG PO TABS
6.0000 mg | ORAL_TABLET | Freq: Every evening | ORAL | Status: DC | PRN
  Administered 2024-10-26: 6 mg via ORAL
  Filled 2024-10-21: qty 2

## 2024-10-21 MED ORDER — LEVOTHYROXINE SODIUM 75 MCG PO TABS
75.0000 ug | ORAL_TABLET | ORAL | Status: DC
  Administered 2024-10-21 – 2024-10-29 (×5): 75 ug via ORAL
  Filled 2024-10-21 (×5): qty 1

## 2024-10-21 MED ORDER — LACTATED RINGERS IV SOLN: INTRAVENOUS | Status: DC

## 2024-10-21 MED ORDER — FERROUS SULFATE 325 (65 FE) MG PO TABS
325.0000 mg | ORAL_TABLET | ORAL | Status: DC
  Administered 2024-10-21 – 2024-10-28 (×4): 325 mg via ORAL
  Filled 2024-10-21 (×6): qty 1

## 2024-10-21 MED ORDER — LACTATED RINGERS IV BOLUS
1000.0000 mL | Freq: Once | INTRAVENOUS | Status: AC
  Administered 2024-10-21: 1000 mL via INTRAVENOUS

## 2024-10-21 MED ORDER — SODIUM CHLORIDE 0.9 % IV SOLN
2.0000 g | INTRAVENOUS | Status: DC
  Administered 2024-10-21 – 2024-10-23 (×3): 2 g via INTRAVENOUS
  Filled 2024-10-21 (×3): qty 20

## 2024-10-21 MED ORDER — ATORVASTATIN CALCIUM 40 MG PO TABS: 40.0000 mg | ORAL_TABLET | Freq: Every day | ORAL | Status: DC

## 2024-10-21 MED ORDER — MAGNESIUM SULFATE 4 GM/100ML IV SOLN
4.0000 g | Freq: Once | INTRAVENOUS | Status: AC
  Administered 2024-10-21: 4 g via INTRAVENOUS
  Filled 2024-10-21: qty 100

## 2024-10-21 MED ORDER — SODIUM CHLORIDE 0.9 % IV SOLN: INTRAVENOUS | Status: AC

## 2024-10-21 MED ORDER — ACETAMINOPHEN 500 MG PO TABS: 500.0000 mg | ORAL_TABLET | Freq: Four times a day (QID) | ORAL | Status: DC | PRN

## 2024-10-21 MED ORDER — INSULIN ASPART 100 UNIT/ML IJ SOLN: 0.0000 [IU] | Freq: Every day | INTRAMUSCULAR | Status: DC

## 2024-10-21 NOTE — Plan of Care (Signed)
  Problem: Acute Rehab OT Goals (only OT should resolve) Goal: Pt. Will Perform Grooming Flowsheets (Taken 10/21/2024 1117) Pt Will Perform Grooming:  with set-up  sitting Goal: Pt. Will Perform Upper Body Dressing Flowsheets (Taken 10/21/2024 1117) Pt Will Perform Upper Body Dressing:  with set-up  sitting Goal: Pt. Will Transfer To Toilet Flowsheets (Taken 10/21/2024 1117) Pt Will Transfer to Toilet:  with min assist  bedside commode Goal: Pt/Caregiver Will Perform Home Exercise Program Flowsheets (Taken 10/21/2024 1117) Pt/caregiver will Perform Home Exercise Program:  Increased ROM  Increased strength  Both right and left upper extremity  With written HEP provided    Chiquita Sermon, OTR/L

## 2024-10-21 NOTE — ED Notes (Signed)
 Daughter Samule and the [facility] updated on pt admittance.

## 2024-10-21 NOTE — Progress Notes (Addendum)
 PROGRESS NOTE  Madison Davidson FMW:[id number] DOB: 1950-12-25 DOA: 10/20/2024 PCP: System, Provider Not In  Brief History:  73 year old female with a history of hypertension, right MCA stroke s/p stent 2022, insomnia, diabetes mellitus type 2, hypothyroidism, depression, paroxysmal atrial fibrillation presenting with lethargy.  [facility]  Patient is difficult historian.  Hx supplemented by daughter. She denies any chest pain, shortness breath, abdominal pain, nausea, vomiting, diarrhea. In the ED, the patient was afebrile and hemodynamically stable with oxygen saturation 100% room air.  WBC 9.1, hemoglobin 10.3, platelets 438.  Sodium 146, potassium 4.9, bicarbonate 11, serum creatinine 1.18.  Per EMS, the patient was noted to be hypotensive with glucose in the 50s. Lactic acid >9.0>>7.0.  TSH 4.860.  CTA chest negative for PE or infiltrates.  CT abdomen and pelvis negative for acute findings per chest x-ray is negative for infiltrates.  UA>50 WBC.  Patient was started fluids and ceftriaxone .   Assessment/Plan: Lethargy/acute metabolic encephalopathy - Secondary to dehydration and UTI - Concerned about possible bacteremia - Continue ceftriaxone  2 g daily - Continue IV fluids - B12 - Check ammonia - VBG - TSH 4.860  Pyuria -UA>50 WBC - Continue ceftriaxone  pending culture data  Lactic acidosis - Check VBG - Continue IV fluids - Continue antibiotics pending culture data  Hypernatremia - Secondary to volume depletion -continue IVF -follow BMP  Paroxysmal atrial fibrillation - Currently in sinus rhythm - Continue apixaban   Hypothyroidism - Continue levothyroxine   Well-controlled diabetes mellitus type 2 - 01/26/2022 hemoglobin A1c 5.9  Mixed hyperlipidemia - Hold statin temporarily due to elevated LFT  Major neurocognitive disorder - Daughter states that the patient has had a cognitive and functional decline over the past 6  months        Family Communication:   daughter 11/26  Consultants:  none  Code Status:  DNR  DVT Prophylaxis:  [place] Heparin  / [place] Lovenox    Procedures: As Listed in Progress Note Above  Antibiotics: Ceftriaxone  11/25>>     Total time spent 50 minutes.  Greater than 50% spent face to face counseling and coordinating care.   Subjective: Patient denies fevers, chills, headache, chest pain, dyspnea, nausea, vomiting, diarrhea, abdominal pain   Objective: Vitals:   10/21/24 0532 10/21/24 0600 10/21/24 0700 10/21/24 0800  BP: (!) 152/33 (!) 151/46 (!) 126/40 (!) 106/47  Pulse: 71 67 66 63  Resp: 17 14 16 19   Temp: (!) 97.5 F (36.4 C)     TempSrc: Oral     SpO2: 100% 100% 97% 100%  Weight: 61.4 kg     Height: 5' 2 (1.575 m)       Intake/Output Summary (Last 24 hours) at 10/21/2024 0909 Last data filed at 10/21/2024 0800 Gross per 24 hour  Intake 2957.89 ml  Output --  Net 2957.89 ml   Weight change:  Exam:  General:  Pt is alert, follows commands appropriately, not in acute distress HEENT: No icterus, No thrush, No neck mass, [place]/AT Cardiovascular: RRR, S1/S2, no rubs, no gallops Respiratory: CTA bilaterally, no wheezing, no crackles, no rhonchi Abdomen: Soft/+BS, non tender, non distended, no guarding Extremities: No edema, No lymphangitis, No petechiae, No rashes, no synovitis-see pics below of feet         Data Reviewed: I have personally reviewed following labs and imaging studies Basic Metabolic Panel: Recent Labs  Lab 10/20/24 2156  NA 146*  K 4.9  CL 108  CO2 11*  GLUCOSE 110*  BUN 37*  CREATININE 1.18*  CALCIUM  8.8*   Liver Function Tests: Recent Labs  Lab 10/20/24 2156  AST 70*  ALT 30  ALKPHOS 68  BILITOT 0.4  PROT 6.5  ALBUMIN 3.0*   No results for input(s): LIPASE, AMYLASE in the last 168 hours. No results for input(s): AMMONIA in the last 168 hours. Coagulation Profile: No results for input(s): INR,  PROTIME in the last 168 hours. CBC: Recent Labs  Lab 10/20/24 2156  WBC 9.1  HGB 10.3*  HCT 35.0*  MCV 95.9  PLT 438*   Cardiac Enzymes: No results for input(s): CKTOTAL, CKMB, CKMBINDEX, TROPONINI in the last 168 hours. BNP: Invalid input(s): POCBNP CBG: Recent Labs  Lab 10/20/24 2139 10/20/24 2214 10/20/24 2238  GLUCAP 57* 87 125*   HbA1C: No results for input(s): HGBA1C in the last 72 hours. Urine analysis:    Component Value Date/Time   COLORURINE YELLOW 10/20/2024 2251   APPEARANCEUR HAZY (A) 10/20/2024 2251   LABSPEC 1.027 10/20/2024 2251   PHURINE 5.0 10/20/2024 2251   GLUCOSEU NEGATIVE 10/20/2024 2251   HGBUR SMALL (A) 10/20/2024 2251   BILIRUBINUR NEGATIVE 10/20/2024 2251   KETONESUR 5 (A) 10/20/2024 2251   PROTEINUR 30 (A) 10/20/2024 2251   NITRITE NEGATIVE 10/20/2024 2251   LEUKOCYTESUR MODERATE (A) 10/20/2024 2251   Sepsis Labs: @LABRCNTIP (procalcitonin:4,lacticidven:4) ) Recent Results (from the past 240 hours)  Culture, blood (Routine X 2) w Reflex to ID Panel     Status: None (Preliminary result)   Collection Time: 10/20/24  9:56 PM   Specimen: BLOOD LEFT ARM  Result Value Ref Range Status   Specimen Description BLOOD LEFT ARM  Final   Special Requests   Final    BOTTLES DRAWN AEROBIC AND ANAEROBIC Blood Culture results may not be optimal due to an inadequate volume of blood received in culture bottles   Culture   Final    NO GROWTH <12 HOURS Performed at [facility], [address] [place], [place] [address]    Report Status PENDING  Incomplete  MRSA Next Gen by PCR, Nasal     Status: None   Collection Time: 10/21/24  5:18 AM   Specimen: Nasal Mucosa; Nasal Swab  Result Value Ref Range Status   MRSA by PCR Next Gen NOT DETECTED NOT DETECTED Final    Comment: (NOTE) The GeneXpert MRSA Assay (FDA approved for NASAL specimens only), is one component of a comprehensive MRSA colonization surveillance program. It is not intended  to diagnose MRSA infection nor to guide or monitor treatment for MRSA infections. Test performance is not FDA approved in patients less than 32 years old. Performed at [facility], [address], [place], [place] [address]   Culture, blood (Routine X 2) w Reflex to ID Panel     Status: None (Preliminary result)   Collection Time: 10/21/24  5:35 AM   Specimen: BLOOD LEFT ARM  Result Value Ref Range Status   Specimen Description BLOOD LEFT ARM  Final   Special Requests   Final    BOTTLES DRAWN AEROBIC AND ANAEROBIC Blood Culture adequate volume   Culture   Final    NO GROWTH <12 HOURS Performed at [facility], [address], [place], [place] [address]    Report Status PENDING  Incomplete     Scheduled Meds:  apixaban   5 mg Oral BID   Chlorhexidine  Gluconate Cloth  6 each Topical Daily   ferrous sulfate   325 mg  Oral Q M,W,F   insulin  aspart  0-5 Units Subcutaneous QHS   insulin  aspart  0-9 Units Subcutaneous TID WC   levothyroxine   75 mcg Oral Q48H   Continuous Infusions:  cefTRIAXone  (ROCEPHIN )  IV Stopped (10/21/24 9378)   lactated ringers  100 mL/hr at 10/21/24 0800    Procedures/Studies: CT ABDOMEN PELVIS W CONTRAST Result Date: 10/21/2024 EXAM: CT ABDOMEN AND PELVIS WITH CONTRAST 10/21/2024 12:12:35 AM TECHNIQUE: CT of the abdomen and pelvis was performed with the administration of 100 ml of intravenous contrast (iohexol  (OMNIPAQUE ) 350 MG/ML injection). Multiplanar reformatted images are provided for review. Automated exposure control, iterative reconstruction, and/or weight-based adjustment of the mA/kV was utilized to reduce the radiation dose to as low as reasonably achievable. COMPARISON: 05/16/2024 CLINICAL HISTORY: Increased lethargy and possible sepsis. FINDINGS: LIVER: The liver is within normal limits. GALLBLADDER AND BILE DUCTS: The gallbladder is within normal limits. No biliary ductal dilatation. SPLEEN: The spleen is unremarkable. PANCREAS: The pancreas is  unremarkable. ADRENAL GLANDS: The adrenal glands are within normal limits. KIDNEYS, URETERS AND BLADDER: Kidneys demonstrated normal enhancement pattern bilaterally. No renal calculi were observed. The ureters are within normal limits. The bladder is decompressed and unremarkable. No hydronephrosis. GI AND BOWEL: Stomach and small bowel are unremarkable. No obstructive or inflammatory changes of the colon are seen. The appendix is within normal limits. There is no bowel obstruction. PERITONEUM AND RETROPERITONEUM: No free fluid is seen. No free air. VASCULATURE: Aortic calcifications are seen without aneurysmal dilatation. LYMPH NODES: No lymphadenopathy. REPRODUCTIVE ORGANS: The uterus has been surgically removed. BONES AND SOFT TISSUES: No acute bony abnormality is seen. No focal soft tissue abnormality. IMPRESSION: 1. No acute findings in the abdomen or pelvis. Electronically signed by: Oneil Devonshire MD 10/21/2024 12:23 AM EST RP Workstation: [id number]   CT Angio Chest PE W and/or Wo Contrast Result Date: 10/21/2024 EXAM: CTA of the Chest with contrast for PE 10/21/2024 12:12:35 AM TECHNIQUE: CTA of the chest was performed without and with the administration of 100 mL iohexol  (OMNIPAQUE ) 350 MG/ML injection. Multiplanar reformatted images are provided for review. MIP images are provided for review. Automated exposure control, iterative reconstruction, and/or weight based adjustment of the mA/kV was utilized to reduce the radiation dose to as low as reasonably achievable. COMPARISON: Chest x-ray from the previous day. CLINICAL HISTORY: Difficulty breathing. FINDINGS: PULMONARY ARTERIES: The pulmonary artery shows a normal branching pattern bilaterally. No filling defect to suggest pulmonary embolism is noted. Main pulmonary artery is normal in caliber. MEDIASTINUM: No cardiac enlargement is seen. Mild coronary calcifications are noted. The thoracic aorta shows atherosclerotic calcifications without aneurysmal  dilatation or dissection. The esophagus is within normal limits. LYMPH NODES: No hilar or mediastinal adenopathy is noted. LUNGS AND PLEURA: Lungs are well aerated bilaterally. No focal infiltrate or effusion is seen. No pneumothorax. SOFT TISSUES AND BONES: No acute soft tissue abnormality. No bony abnormality is noted. IMPRESSION: 1. No pulmonary embolism. Electronically signed by: Oneil Devonshire MD 10/21/2024 12:19 AM EST RP Workstation: [id number]   DG Chest Portable 1 View Result Date: 10/20/2024 EXAM: 1 VIEW(S) XRAY OF THE CHEST 10/20/2024 11:04:00 PM COMPARISON: Comparison with 02/29/2024. CLINICAL HISTORY: eval for pneumonia. eval for pneumonia FINDINGS: LUNGS AND PLEURA: Shallow inspiration. No focal pulmonary opacity. No pleural effusion. No pneumothorax. HEART AND MEDIASTINUM: Calcification of the aorta. BONES AND SOFT TISSUES: Degenerative changes in the spine and shoulders. IMPRESSION: 1. No focal airspace consolidation to suggest pneumonia. Evaluation limited by shallow inspiration. Electronically signed by: Elsie Gravely MD  10/20/2024 11:07 PM EST RP Workstation: [id number]    Alm Schneider, DO  [facility]  If 7PM-7AM, please contact night-coverage [email or web address] Password [email or web address] 10/21/2024, 9:09 AM   LOS: 0 days

## 2024-10-21 NOTE — Consult Note (Addendum)
 WOC Nurse Consult Note:  WOC consult performed remotely utilizing imaging and chart review Reason for Consult:heel wound  Wound type: L heel deep tissue pressure injury- evolving with skin sloughing Deep purple maroon discoloration to tips of L/R great toe with surrounding skin rubor  concern for possible perfusion issues, patient with history of DM II (provider notified) Pressure Injury POA: Yes Measurement: see nursing flow sheets Wound bed:  L heel: deep purple maroon discoloration L/R great toe: deep purple maroon discoloration Drainage (amount, consistency, odor) see nursing flow sheets Periwound: L heel: intact R/L great toe: rubor  procedure/placement/frequency:  Cleanse wound with NS, pat dry. Apply Xeroform to wound bed and cover with silicone foam dressing.  Change daily.       WOC team will not follow patient at this time, please re consult if new needs arise or wound deteriorates.  Thank you,  Doyal Polite, MSN, RN, [facility] WOC Team [phone] (Available Mon-Fri 0700-1500)

## 2024-10-21 NOTE — Hospital Course (Addendum)
 73 year old female with a history of hypertension, right MCA stroke s/p stent 2022, insomnia, diabetes mellitus type 2, hypothyroidism, depression, paroxysmal atrial fibrillation presenting with lethargy.  [facility]  Patient is difficult historian.  Hx supplemented by daughter. She denies any chest pain, shortness breath, abdominal pain, nausea, vomiting, diarrhea. In the ED, the patient was afebrile and hemodynamically stable with oxygen saturation 100% room air.  WBC 9.1, hemoglobin 10.3, platelets 438.  Sodium 146, potassium 4.9, bicarbonate 11, serum creatinine 1.18.  Per EMS, the patient was noted to be hypotensive with glucose in the 50s. Lactic acid >9.0>>7.0.  TSH 4.860.  CTA chest negative for PE or infiltrates.  CT abdomen and pelvis negative for acute findings per chest x-ray is negative for infiltrates.  UA>50 WBC.  Patient was started fluids and ceftriaxone . Her mental status improved and returned to baseline.  Her hospitalization was prolonged due to development of afib with RVR.  She was started on IV diltiazem  with gradual improvement of her HR as her metabolic abnormalities improved.  Her IV diltiazem  was converted to po which she tolerated. In the morning of 10/26/2024, the patient developed RVR again.  She was started on IV amiodarone .

## 2024-10-21 NOTE — Plan of Care (Signed)

## 2024-10-21 NOTE — Plan of Care (Signed)
  Problem: Acute Rehab PT Goals(only PT should resolve) Goal: Pt Will Go Supine/Side To Sit Outcome: Progressing Flowsheets (Taken 10/21/2024 1210) Pt will go Supine/Side to Sit:  with minimal assist  with contact guard assist Goal: Pt Will Go Sit To Supine/Side Outcome: Progressing Flowsheets (Taken 10/21/2024 1210) Pt will go Sit to Supine/Side:  with contact guard assist  with minimal assist Goal: Patient Will Perform Sitting Balance Outcome: Progressing Flowsheets (Taken 10/21/2024 1210) Patient will perform sitting balance:  with contact guard assist  with minimal assist Goal: Patient Will Transfer Sit To/From Stand Outcome: Progressing Flowsheets (Taken 10/21/2024 1210) Patient will transfer sit to/from stand:  with contact guard assist  with minimal assist Goal: Pt Will Transfer Bed To Chair/Chair To Bed Outcome: Progressing Flowsheets (Taken 10/21/2024 1210) Pt will Transfer Bed to Chair/Chair to Bed:  with contact guard assist  with min assist Goal: Pt Will Perform Standing Balance Or Pre-Gait Outcome: Progressing Flowsheets (Taken 10/21/2024 1210) Pt will perform standing balance or pre-gait:  with contact guard assist  with minimal assist  with unilateral UE support Note: W/ RW Goal: Pt Will Ambulate Outcome: Progressing Flowsheets (Taken 10/21/2024 1210) Pt will Ambulate:  15 feet  10 feet  with minimal assist  with moderate assist  with rolling walker   Teryn Boerema, SPT

## 2024-10-21 NOTE — H&P (Addendum)
 History and Physical  Madison Davidson FMW:[id number] DOB: 06-01-1951 DOA: 10/20/2024  Referring physician: Dr. Lorette, EDP  PCP: System, Provider Not In  Outpatient Specialists: Cardiology. Patient coming from: SNF.  Chief Complaint: Lethargy.  HPI: Madison Davidson is a 73 y.o. female with medical history significant for paroxysmal atrial fibrillation, hypertension, hyperlipidemia, type 2 diabetes, prior stroke, hypothyroidism, who presents to the ER due to lethargy for the past 2 days.  Associated with low appetite and poor oral intake.  EMS was activated.  Upon EMS arrival the patient was noted to be hypotensive, cool and clammy.  She received 500 cc of normal saline bolus en route.  The patient is a poor historian, stating she does not remember things because of her prior stroke.  In the ER, tachycardic with soft BPs.  Lab studies notable for lactic acidosis 7.0 from greater than 9.0.  UA positive for pyuria.  No leukocytosis.  Started on Rocephin  for UTI.  Admitted by [facility], hospitalist service.  ED Course: Temperature 97.6.  BP 130/51, pulse 73, respiratory rate 18, O2 saturation of 100% on room air.  Review of Systems: Review of systems as noted in the HPI. All other systems reviewed and are negative.   Past Medical History:  Diagnosis Date   Cancer (HCC)    skin   Coronary artery disease    Diabetes mellitus without complication (HCC)    Hypertension    Stroke [facility])    Past Surgical History:  Procedure Laterality Date   ABDOMINAL HYSTERECTOMY     IR ANGIO INTRA EXTRACRAN SEL INTERNAL CAROTID BILAT MOD SED  05/11/2021   IR ANGIO VERTEBRAL SEL VERTEBRAL UNI R MOD SED  05/11/2021   IR ANGIOGRAM EXTREMITY LEFT  05/11/2021   IR CT HEAD LTD  05/11/2021   IR INTRA CRAN STENT  05/11/2021   IR INTRA CRAN STENT  05/12/2021   IR US  GUIDE VASC ACCESS RIGHT  05/11/2021   RADIOLOGY WITH ANESTHESIA N/A 05/11/2021   Procedure: IR WITH ANESTHESIA - INTRACRANIAL STENT;  Surgeon: de Macedo  Rodrigues, Katyucia, MD;  Location: [facility] OR;  Service: Radiology;  Laterality: N/A;    Social History:  reports that she has never smoked. She has never used smokeless tobacco. She reports that she does not drink alcohol and does not use drugs.   Allergies  Allergen Reactions   Augmentin [Amoxicillin-Pot Clavulanate] Other (See Comments)    Allergy    Doxycycline Other (See Comments)    Allergy    Lisinopril  Other (See Comments)    Allergy    Norvasc [Amlodipine] Other (See Comments)    Allergy     Family History  Problem Relation Age of Onset   Heart disease Mother    Heart disease Father       Prior to Admission medications   Medication Sig Start Date End Date Taking? Authorizing Provider  acetaminophen  (TYLENOL ) 325 MG tablet Take 2 tablets (650 mg total) by mouth every 6 (six) hours as needed for mild pain, fever or headache. 05/24/21   Angiulli, Toribio PARAS, PA-C  apixaban  (ELIQUIS ) 5 MG TABS tablet Take 1 tablet (5 mg total) by mouth 2 (two) times daily. 08/19/24 12/17/24  Mallipeddi, Vishnu P, MD  atorvastatin  (LIPITOR ) 40 MG tablet Take 1 tablet (40 mg total) by mouth daily. 07/28/24   Mallipeddi, Vishnu P, MD  BELSOMRA 5 MG TABS Take 0.5 tablets by mouth at bedtime as needed. 06/21/24   [provider]  cholecalciferol (VITAMIN D3) 25  MCG (1000 UNIT) tablet Take 1,000 Units by mouth daily.    [provider]  dextromethorphan-guaiFENesin (GERI-TUSSIN DM) 10-100 MG/5ML liquid Take 10 mLs by mouth every 4 (four) hours as needed for cough.    [provider]  divalproex  (DEPAKOTE ) 125 MG DR tablet Take 125 mg by mouth 2 (two) times daily. 06/08/24   [provider]  ferrous sulfate  325 (65 FE) MG tablet Take 325 mg by mouth as directed. On Monday, Wednesday, Friday    [provider]  levothyroxine  (SYNTHROID ) 50 MCG tablet Take 50 mcg by mouth every other day.    [provider]  levothyroxine  (SYNTHROID ) 75 MCG tablet Take 75 mcg  by mouth every other day. 10/30/21   [provider]  lisinopril  (ZESTRIL ) 20 MG tablet Take 1 tablet (20 mg total) by mouth daily. 05/24/21   Angiulli, Toribio PARAS, PA-C  metFORMIN  (GLUCOPHAGE ) 1000 MG tablet Take 1 tablet (1,000 mg total) by mouth 2 (two) times daily with a meal. 05/24/21   Angiulli, Toribio PARAS, PA-C  omeprazole (PRILOSEC) 20 MG capsule Take 20 mg by mouth daily.    [provider]  Saline (AYR SALINE NASAL DROPS) 0.65 % SOLN Place 1 spray into the nose 2 (two) times daily as needed.    [provider]  sertraline (ZOLOFT) 25 MG tablet Take 25 mg by mouth daily.    [provider]    Physical Exam: BP (!) 130/51   Pulse 73   Temp 97.6 F (36.4 C) (Oral)   Resp 18   Ht 5' 2 (1.575 m)   Wt 65.9 kg   SpO2 100%   BMI 26.57 kg/m   General: 73 y.o. year-old female well developed well nourished in no acute distress.  Alert and oriented x1. Cardiovascular: Regular rate and rhythm with no rubs or gallops.  No thyromegaly or JVD noted.  No lower extremity edema. 2/4 pulses in all 4 extremities. Respiratory: Clear to auscultation with no wheezes or rales. Good inspiratory effort. Abdomen: Soft nontender nondistended with normal bowel sounds x4 quadrants. Muskuloskeletal: No cyanosis, clubbing or edema noted bilaterally Neuro: CN II-XII intact, strength, sensation, reflexes Skin: No ulcerative lesions noted or rashes Psychiatry: Mood is appropriate for condition and setting          Labs on Admission:  Basic Metabolic Panel: Recent Labs  Lab 10/20/24 2156  NA 146*  K 4.9  CL 108  CO2 11*  GLUCOSE 110*  BUN 37*  CREATININE 1.18*  CALCIUM  8.8*   Liver Function Tests: Recent Labs  Lab 10/20/24 2156  AST 70*  ALT 30  ALKPHOS 68  BILITOT 0.4  PROT 6.5  ALBUMIN 3.0*   No results for input(s): LIPASE, AMYLASE in the last 168 hours. No results for input(s): AMMONIA in the last 168 hours. CBC: Recent Labs  Lab 10/20/24 2156   WBC 9.1  HGB 10.3*  HCT 35.0*  MCV 95.9  PLT 438*   Cardiac Enzymes: No results for input(s): CKTOTAL, CKMB, CKMBINDEX, TROPONINI in the last 168 hours.  BNP (last 3 results) No results for input(s): BNP in the last 8760 hours.  ProBNP (last 3 results) No results for input(s): PROBNP in the last 8760 hours.  CBG: Recent Labs  Lab 10/20/24 2139 10/20/24 2214 10/20/24 2238  GLUCAP 57* 87 125*    Radiological Exams on Admission: CT ABDOMEN PELVIS W CONTRAST Result Date: 10/21/2024 EXAM: CT ABDOMEN AND PELVIS WITH CONTRAST 10/21/2024 12:12:35 AM TECHNIQUE: CT of  the abdomen and pelvis was performed with the administration of 100 ml of intravenous contrast (iohexol  (OMNIPAQUE ) 350 MG/ML injection). Multiplanar reformatted images are provided for review. Automated exposure control, iterative reconstruction, and/or weight-based adjustment of the mA/kV was utilized to reduce the radiation dose to as low as reasonably achievable. COMPARISON: 05/16/2024 CLINICAL HISTORY: Increased lethargy and possible sepsis. FINDINGS: LIVER: The liver is within normal limits. GALLBLADDER AND BILE DUCTS: The gallbladder is within normal limits. No biliary ductal dilatation. SPLEEN: The spleen is unremarkable. PANCREAS: The pancreas is unremarkable. ADRENAL GLANDS: The adrenal glands are within normal limits. KIDNEYS, URETERS AND BLADDER: Kidneys demonstrated normal enhancement pattern bilaterally. No renal calculi were observed. The ureters are within normal limits. The bladder is decompressed and unremarkable. No hydronephrosis. GI AND BOWEL: Stomach and small bowel are unremarkable. No obstructive or inflammatory changes of the colon are seen. The appendix is within normal limits. There is no bowel obstruction. PERITONEUM AND RETROPERITONEUM: No free fluid is seen. No free air. VASCULATURE: Aortic calcifications are seen without aneurysmal dilatation. LYMPH NODES: No lymphadenopathy. REPRODUCTIVE  ORGANS: The uterus has been surgically removed. BONES AND SOFT TISSUES: No acute bony abnormality is seen. No focal soft tissue abnormality. IMPRESSION: 1. No acute findings in the abdomen or pelvis. Electronically signed by: Oneil Devonshire MD 10/21/2024 12:23 AM EST RP Workstation: [id number]   CT Angio Chest PE W and/or Wo Contrast Result Date: 10/21/2024 EXAM: CTA of the Chest with contrast for PE 10/21/2024 12:12:35 AM TECHNIQUE: CTA of the chest was performed without and with the administration of 100 mL iohexol  (OMNIPAQUE ) 350 MG/ML injection. Multiplanar reformatted images are provided for review. MIP images are provided for review. Automated exposure control, iterative reconstruction, and/or weight based adjustment of the mA/kV was utilized to reduce the radiation dose to as low as reasonably achievable. COMPARISON: Chest x-ray from the previous day. CLINICAL HISTORY: Difficulty breathing. FINDINGS: PULMONARY ARTERIES: The pulmonary artery shows a normal branching pattern bilaterally. No filling defect to suggest pulmonary embolism is noted. Main pulmonary artery is normal in caliber. MEDIASTINUM: No cardiac enlargement is seen. Mild coronary calcifications are noted. The thoracic aorta shows atherosclerotic calcifications without aneurysmal dilatation or dissection. The esophagus is within normal limits. LYMPH NODES: No hilar or mediastinal adenopathy is noted. LUNGS AND PLEURA: Lungs are well aerated bilaterally. No focal infiltrate or effusion is seen. No pneumothorax. SOFT TISSUES AND BONES: No acute soft tissue abnormality. No bony abnormality is noted. IMPRESSION: 1. No pulmonary embolism. Electronically signed by: Oneil Devonshire MD 10/21/2024 12:19 AM EST RP Workstation: [id number]   DG Chest Portable 1 View Result Date: 10/20/2024 EXAM: 1 VIEW(S) XRAY OF THE CHEST 10/20/2024 11:04:00 PM COMPARISON: Comparison with 02/29/2024. CLINICAL HISTORY: eval for pneumonia. eval for pneumonia FINDINGS: LUNGS  AND PLEURA: Shallow inspiration. No focal pulmonary opacity. No pleural effusion. No pneumothorax. HEART AND MEDIASTINUM: Calcification of the aorta. BONES AND SOFT TISSUES: Degenerative changes in the spine and shoulders. IMPRESSION: 1. No focal airspace consolidation to suggest pneumonia. Evaluation limited by shallow inspiration. Electronically signed by: Elsie Gravely MD 10/20/2024 11:07 PM EST RP Workstation: [id number]    EKG: I independently viewed the EKG done and my findings are as followed: Sinus rhythm rate of 98.  QTc 446.  Assessment/Plan Present on Admission:  Lethargy  Principal Problem:   Lethargy  Lethargy, suspect multifactorial secondary to dehydration and UTI, POA Continue to treat underlying conditions Reorient as needed Fall precautions.  Presumed UTI, POA UA positive for pyuria Follow urine culture for  ID and sensitivities.  Lactic acidosis Lactic acid greater than 9.0, 7.0 No reported seizure activity Continue IV fluid hydration Trend lactic acid Follow urine culture and peripheral blood cultures x 2  High anion gap metabolic acidosis likely secondary to lactic acidosis Serum bicarb 11, anion gap 27 Continue to treat underlying conditions Repeat CMP in the morning.  Isolated elevated AST No reported use of alcohol, AST: ALT 2:1 ratio Continue to monitor for now  Mild hypernatremia secondary to dehydration Serum sodium 146 Continue LR at 100 cc/h x 2 days Repeat chemistry panel Encourage oral fluid intake  Type 2 diabetes, well-controlled Last hemoglobin A1c in 2023 was 5.9. Update A1c Insulin  sliding scale.  Hypothyroidism Resume home levothyroxine   Hyperlipidemia Resume home Lipitor   Paroxysmal A-fib Currently rate controlled Resume home Eliquis  Continue to monitor on telemetry.  Chronic normocytic anemia Hemoglobin 10.3, MCV 95, at baseline No reported overt bleeding Outpatient follow-up for anemia workup  Generalized  weakness Ambulates with a cane at baseline PT OT evaluation Fall precautions.   Critical care time: 55 minutes.   DVT prophylaxis: Home Eliquis .  Code Status: DNR.  Family Communication: None at bedside.  Updated her daughter Samule via phone.  Disposition Plan: Admitted to stepdown unit.  Consults called: None.  Admission status: Inpatient status.   Status is: Inpatient The patient requires at least 2 midnights for further evaluation and treatment of present condition.   Terry LOISE Hurst MD [facility] Pager [phone]  If 7PM-7AM, please contact night-coverage [email or web address] Password [email or web address]  10/21/2024, 4:37 AM

## 2024-10-21 NOTE — ED Notes (Signed)
 Pt has been changed and cleaned. Pt is dry and resting at this time with call light in reach.

## 2024-10-21 NOTE — Evaluation (Addendum)
 Physical Therapy Evaluation Patient Details Name: Madison Davidson MRN: [id number] DOB: 1951/03/11 Today's Date: 10/21/2024  History of Present Illness  HPI: Madison Davidson is a 73 y.o. female with medical history significant for paroxysmal atrial fibrillation, hypertension, hyperlipidemia, type 2 diabetes, prior stroke, hypothyroidism, who presents to the ER due to lethargy for the past 2 days.  Associated with low appetite and poor oral intake.  EMS was activated.  Upon EMS arrival the patient was noted to be hypotensive, cool and clammy.  She received 500 cc of normal saline bolus en route.  The patient is a poor historian, stating she does not remember things because of her prior stroke.     In the ER, tachycardic with soft BPs.  Lab studies notable for lactic acidosis 7.0 from greater than 9.0.  UA positive for pyuria.  No leukocytosis.  Started on Rocephin  for UTI.  Admitted by [facility], hospitalist service.    Clinical Impression  Pt. Presented w/ general weakness in LE and B LE ankle pain due to wounds.Co-treated w/ OT.  Pt was initially lethargic during treatment, however gradually understood commands. Pt was able to perform bed mobility w/ Mod A/ Max A to EOB, w/ cuing pt was able to assist towards EOB. Pt was able to perform transfer from bed to chair w/ Mod A/ Max A, during side step ambulation pt had fear of falling which was monitored, pt required Max A w/ RW during ambulation. Pt was left in chair w/ call bell, chair alarm on. Nursing staff was notified on pt. Status. Patient will benefit from continued skilled physical therapy in hospital and recommended venue below to increase strength, balance, endurance for safe ADLs and gait.       If plan is discharge home, recommend the following: A lot of help with bathing/dressing/bathroom;A little help with bathing/dressing/bathroom;Help with stairs or ramp for entrance;Assist for transportation;Assistance with cooking/housework   Can travel  by private vehicle   No    Equipment Recommendations None recommended by PT  Recommendations for Other Services       Functional Status Assessment Patient has had a recent decline in their functional status and demonstrates the ability to make significant improvements in function in a reasonable and predictable amount of time.     Precautions / Restrictions Precautions Precautions: Fall Recall of Precautions/Restrictions: Impaired Restrictions Weight Bearing Restrictions Per Provider Order: No      Mobility  Bed Mobility Overal bed mobility: Needs Assistance Bed Mobility: Supine to Sit     Supine to sit: Max assist, Mod assist     General bed mobility comments: pt unable to move LE, max assist to move LE and UE and scoot to EOB    Transfers Overall transfer level: Needs assistance Equipment used: Rolling walker (2 wheels) Transfers: Sit to/from Stand, Bed to chair/wheelchair/BSC Sit to Stand: Mod assist, Max assist   Step pivot transfers: Mod assist       General transfer comment: slow labored movemnet and increased time, mod max assist    Ambulation/Gait Ambulation/Gait assistance: Mod assist, Max assist Gait Distance (Feet): 3 Feet Assistive device: Rolling walker (2 wheels) Gait Pattern/deviations: Step-to pattern, Decreased step length - right, Decreased step length - left, Decreased stance time - right, Decreased stance time - left, Antalgic       General Gait Details: Pt was able to perfom side step ambulate w/ RW, pt had diffuclty w/ fully WB on B LE due to pain  Stairs  Wheelchair Mobility     Tilt Bed    Modified Rankin (Stroke Patients Only)       Balance Overall balance assessment: Needs assistance Sitting-balance support: Feet supported, No upper extremity supported Sitting balance-Leahy Scale: Fair Sitting balance - Comments: Fair/good at EOB   Standing balance support: Bilateral upper extremity supported, During  functional activity, Reliant on assistive device for balance Standing balance-Leahy Scale: Fair                               Pertinent Vitals/Pain Pain Assessment Pain Assessment: Faces Faces Pain Scale: Hurts little more Pain Location: LE (Bilateral ankle) Pain Intervention(s): Limited activity within patient's tolerance, Monitored during session, Repositioned    Home Living Family/patient expects to be discharged to:: Assisted living                        Prior Function Prior Level of Function : Needs assist;Patient poor historian/Family not available       Physical Assist : Mobility (physical);ADLs (physical) Mobility (physical): Bed mobility;Transfers;Gait ADLs (physical): Grooming;Bathing;Dressing;Toileting;IADLs Mobility Comments: pt coming from ALF- unsure of prior history ADLs Comments: pt comin from ALF unsure of prior history due to pt poor historian     Extremity/Trunk Assessment   Upper Extremity Assessment Upper Extremity Assessment: Defer to OT evaluation    Lower Extremity Assessment Lower Extremity Assessment: Generalized weakness    Cervical / Trunk Assessment Cervical / Trunk Assessment: Normal  Communication   Communication Communication: No apparent difficulties    Cognition Arousal: Alert Behavior During Therapy: WFL for tasks assessed/performed   PT - Cognitive impairments: No apparent impairments                         Following commands: Intact       Cueing Cueing Techniques: Verbal cues, Gestural cues, Tactile cues     General Comments      Exercises     Assessment/Plan    PT Assessment Patient needs continued PT services  PT Problem List Decreased strength;Decreased range of motion;Decreased activity tolerance;Decreased safety awareness;Decreased balance;Decreased mobility;Decreased coordination       PT Treatment Interventions DME instruction;Gait training;Stair training;Patient/family  education;Functional mobility training;Therapeutic activities;Therapeutic exercise;Balance training    PT Goals (Current goals can be found in the Care Plan section)  Acute Rehab PT Goals Patient Stated Goal: return home PT Goal Formulation: With patient Time For Goal Achievement: 10/27/24 Potential to Achieve Goals: Good    Frequency Min 3X/week     Co-evaluation PT/OT/SLP Co-Evaluation/Treatment: Yes Reason for Co-Treatment: To address functional/ADL transfers PT goals addressed during session: Mobility/safety with mobility OT goals addressed during session: ADL's and self-care       AM-PAC PT 6 Clicks Mobility  Outcome Measure Help needed turning from your back to your side while in a flat bed without using bedrails?: A Lot Help needed moving from lying on your back to sitting on the side of a flat bed without using bedrails?: A Lot Help needed moving to and from a bed to a chair (including a wheelchair)?: A Lot Help needed standing up from a chair using your arms (e.g., wheelchair or bedside chair)?: A Lot Help needed to walk in hospital room?: Total Help needed climbing 3-5 steps with a railing? : Total 6 Click Score: 10    End of Session   Activity Tolerance: Patient tolerated treatment well;Patient  limited by fatigue;Patient limited by pain Patient left: with call bell/phone within reach;in chair Nurse Communication: Mobility status PT Visit Diagnosis: Muscle weakness (generalized) (M62.81);History of falling (Z91.81);Pain Pain - Right/Left:  (B LE) Pain - part of body: Ankle and joints of foot    Time: 9064-9044 PT Time Calculation (min) (ACUTE ONLY): 20 min   Charges:   PT Evaluation $PT Eval Moderate Complexity: 1 Mod PT Treatments $Therapeutic Activity: 8-22 mins PT General Charges $$ ACUTE PT VISIT: 1 Visit         Katarzyna Wolven, SPT

## 2024-10-21 NOTE — Progress Notes (Signed)
 0800 patient alert to self and place only on room air   1200 patient 2 person lift assist back to bed from chair patient unable to help at all.  1400 patient bladder scanned due to decrease in urine output 166 ml noted in bladder

## 2024-10-21 NOTE — Evaluation (Signed)
 Occupational Therapy Evaluation Patient Details Name: Madison Davidson MRN: [id number] DOB: Jan 03, 1951 Today's Date: 10/21/2024   History of Present Illness   HPI: Madison Davidson is a 73 y.o. female with medical history significant for paroxysmal atrial fibrillation, hypertension, hyperlipidemia, type 2 diabetes, prior stroke, hypothyroidism, who presents to the ER due to lethargy for the past 2 days.  Associated with low appetite and poor oral intake.  EMS was activated.  Upon EMS arrival the patient was noted to be hypotensive, cool and clammy.  She received 500 cc of normal saline bolus en route.  The patient is a poor historian, stating she does not remember things because of her prior stroke.     In the ER, tachycardic with soft BPs.  Lab studies notable for lactic acidosis 7.0 from greater than 9.0.  UA positive for pyuria.  No leukocytosis.  Started on Rocephin  for UTI.  Admitted by [facility], hospitalist service.     Clinical Impressions Pt agreeable to OT/PT evaluation. Per RN, pt coming from ALF. Pt is poor histprian and unable to provide accurate PLOF and history. Pt unable to move LE when asked to lift legs. Pt required max assist to don socks in bed, Mod/max assist required for bed mobility to go from supine to sitting EOB. Mod max assist required for sit to stand t/f and t/f to chair with use of RW. Pt able to self feed with set up in bed. Pt would continue to benefit from OT services while in acute care setting.      If plan is discharge home, recommend the following:   A lot of help with walking and/or transfers;A lot of help with bathing/dressing/bathroom;Assistance with cooking/housework;Direct supervision/assist for medications management;Assist for transportation;Help with stairs or ramp for entrance     Functional Status Assessment   Patient has had a recent decline in their functional status and demonstrates the ability to make significant improvements in function in a  reasonable and predictable amount of time.     Equipment Recommendations   None recommended by OT      Precautions/Restrictions   Precautions Precautions: Fall Recall of Precautions/Restrictions: Impaired Restrictions Weight Bearing Restrictions Per Provider Order: No     Mobility Bed Mobility Overal bed mobility: Needs Assistance Bed Mobility: Supine to Sit     Supine to sit: Max assist     General bed mobility comments: pt unable to move LE, max assist to move LE and UE and scoot to EOB    Transfers Overall transfer level: Needs assistance Equipment used: Rolling walker (2 wheels) Transfers: Sit to/from Stand, Bed to chair/wheelchair/BSC Sit to Stand: Mod assist, Max assist     Step pivot transfers: Mod assist     General transfer comment: slow labored movemnet and increased time, mod max assist      Balance Overall balance assessment: Needs assistance Sitting-balance support: Feet supported, No upper extremity supported Sitting balance-Leahy Scale: Good     Standing balance support: Bilateral upper extremity supported, During functional activity, Reliant on assistive device for balance Standing balance-Leahy Scale: Fair                             ADL either performed or assessed with clinical judgement   ADL Overall ADL's : Needs assistance/impaired Eating/Feeding: Set up;Sitting   Grooming: Set up;Sitting   Upper Body Bathing: Minimal assistance;Sitting   Lower Body Bathing: Maximal assistance;Sitting/lateral leans   Upper Body Dressing :  Moderate assistance;Sitting   Lower Body Dressing: Maximal assistance;Sitting/lateral leans   Toilet Transfer: Moderate assistance;Maximal assistance;Rolling walker (2 wheels)   Toileting- Clothing Manipulation and Hygiene: Maximal assistance;Sitting/lateral lean   Tub/ Shower Transfer: Moderate assistance;Maximal assistance;Rolling walker (2 wheels);Tub bench   Functional mobility during  ADLs: Rolling walker (2 wheels);Minimal assistance       Vision Baseline Vision/History: 0 No visual deficits Ability to See in Adequate Light: 0 Adequate Vision Assessment?: No apparent visual deficits            Pertinent Vitals/Pain Pain Assessment Pain Assessment: Faces Faces Pain Scale: Hurts little more Pain Location: LE Pain Intervention(s): Limited activity within patient's tolerance, Monitored during session     Extremity/Trunk Assessment Upper Extremity Assessment Upper Extremity Assessment: Overall WFL for tasks assessed;Generalized weakness   Lower Extremity Assessment Lower Extremity Assessment: Defer to PT evaluation   Cervical / Trunk Assessment Cervical / Trunk Assessment: Normal   Communication Communication Communication: No apparent difficulties   Cognition Arousal: Alert Behavior During Therapy: WFL for tasks assessed/performed Cognition: Difficult to assess, No family/caregiver present to determine baseline                               Following commands: Intact       Cueing  General Comments   Cueing Techniques: Verbal cues;Gestural cues;Tactile cues      Exercises     Shoulder Instructions      Home Living Family/patient expects to be discharged to:: Assisted living                                        Prior Functioning/Environment Prior Level of Function : Needs assist;Patient poor historian/Family not available       Physical Assist : Mobility (physical);ADLs (physical) Mobility (physical): Bed mobility;Transfers;Gait ADLs (physical): Grooming;Bathing;Dressing;Toileting;IADLs Mobility Comments: pt coming from ALF- unsure of prior history ADLs Comments: pt comin from ALF unsure of prior history due to pt poor historian    OT Problem List: Decreased strength;Decreased activity tolerance;Impaired balance (sitting and/or standing);Decreased safety awareness   OT Treatment/Interventions:  Self-care/ADL training;Therapeutic exercise;DME and/or AE instruction;Therapeutic activities;Patient/family education      OT Goals(Current goals can be found in the care plan section)   Acute Rehab OT Goals Patient Stated Goal: return to ALF OT Goal Formulation: Patient unable to participate in goal setting Time For Goal Achievement: 11/04/24 Potential to Achieve Goals: Fair ADL Goals Pt Will Perform Grooming: with set-up;sitting Pt Will Perform Upper Body Dressing: with set-up;sitting Pt Will Transfer to Toilet: with min assist;bedside commode Pt/caregiver will Perform Home Exercise Program: Increased ROM;Increased strength;Both right and left upper extremity;With written HEP provided   OT Frequency:  Min 1X/week    Co-evaluation PT/OT/SLP Co-Evaluation/Treatment: Yes Reason for Co-Treatment: To address functional/ADL transfers   OT goals addressed during session: ADL's and self-care      AM-PAC OT 6 Clicks Daily Activity     Outcome Measure Help from another person eating meals?: A Little Help from another person taking care of personal grooming?: A Little Help from another person toileting, which includes using toliet, bedpan, or urinal?: A Lot Help from another person bathing (including washing, rinsing, drying)?: A Lot Help from another person to put on and taking off regular upper body clothing?: A Lot Help from another person to put on and taking off  regular lower body clothing?: A Lot 6 Click Score: 14   End of Session Equipment Utilized During Treatment: Rolling walker (2 wheels) Nurse Communication: Mobility status  Activity Tolerance: Patient tolerated treatment well Patient left: in chair;with call bell/phone within reach;with chair alarm set  OT Visit Diagnosis: Muscle weakness (generalized) (M62.81);Unsteadiness on feet (R26.81)                Time: 9059-8987 OT Time Calculation (min): 32 min Charges:  OT General Charges $OT Visit: 1 Visit OT  Evaluation $OT Eval Low Complexity: 1 Low    Chiquita LOISE Sermon, OTR/L 10/21/2024, 11:19 AM

## 2024-10-21 NOTE — TOC Initial Note (Signed)
 Transition of Care [facility]) - Initial/Assessment Note    Patient Details  Name: Madison Davidson MRN: [id number] Date of Birth: 1951-10-15  [facility]) CM/SW Contact:    Hoy DELENA Bigness, LCSW Phone Number: 10/21/2024, 10:39 AM  Clinical Narrative:                 Pt from [facility]. CSW spoke with Chasity at [facility] who shares that pt was ambulatory with a RW a week and a half ago and since has had a significant decline. [facility] are able to evaluate pt returning to their facility once she has completed STR.  CSW spoke with pt's daughter, Samule, and confirmed plan for STR. Currently no facility preference. SNF referrals have been faxed out and currently awaiting offers. Pt will need a 3 [place] stay for insurance to cover rehab.   Expected Discharge Plan: Skilled Nursing Facility Barriers to Discharge: Continued Medical Work up   Patient Goals and CMS Choice Patient states their goals for this hospitalization and ongoing recovery are:: For pt to improve and get rehab CMS Medicare.gov Compare Post Acute Care list provided to:: Patient Represenative (must comment) Choice offered to / list presented to : Adult Children [place] ownership interest in [facility].provided to:: Adult Children    Expected Discharge Plan and Services In-house Referral: Clinical Social Work Discharge Planning Services: NA Post Acute Care Choice: Skilled Nursing Facility Living arrangements for the past 2 months: Assisted Living Facility                 DME Arranged: N/A DME Agency: NA                  Prior Living Arrangements/Services Living arrangements for the past 2 months: Assisted Living Facility Lives with:: Facility Resident Patient language and need for interpreter reviewed:: Yes Do you feel safe going back to the place where you live?: Yes      Need for Family Participation in Patient Care: Yes (Comment) Care giver support system in place?: Yes  (comment) Current home services: DME (RW, wheelchair) Criminal Activity/Legal Involvement Pertinent to Current Situation/Hospitalization: No - Comment as needed  Activities of Daily Living   ADL Screening (condition at time of admission) Independently performs ADLs?: No Does the patient have a NEW difficulty with bathing/dressing/toileting/self-feeding that is expected to last >3 days?: No Does the patient have a NEW difficulty with getting in/out of bed, walking, or climbing stairs that is expected to last >3 days?: No Does the patient have a NEW difficulty with communication that is expected to last >3 days?: No Is the patient deaf or have difficulty hearing?: No Does the patient have difficulty seeing, even when wearing glasses/contacts?: No Does the patient have difficulty concentrating, remembering, or making decisions?: Yes  Permission Sought/Granted Permission sought to share information with : Facility Medical Sales Representative, Family Supports Permission granted to share information with : Yes, Verbal Permission Granted     Permission granted to share info w AGENCY: [facility]; SNF's  Permission granted to share info w Relationship: Daughters     Emotional Assessment   Attitude/Demeanor/Rapport: Unable to Assess Affect (typically observed): Unable to Assess Orientation: : Oriented to Self, Oriented to Place Alcohol / Substance Use: Not Applicable Psych Involvement: No (comment)  Admission diagnosis:  Dehydration [E86.0] Hypernatremia [E87.0] Lethargy [R53.83] Hypoglycemia [E16.2] Hypotension, unspecified hypotension type [I95.9] Lactic acidemia [E87.20] Patient Active Problem List   Diagnosis Date Noted   Lethargy 10/21/2024  Acute metabolic encephalopathy 10/21/2024   Lactic acidosis 10/21/2024   Cystitis 10/21/2024   Hypernatremia 10/21/2024   PAF (paroxysmal atrial fibrillation) (HCC) 07/28/2024   Long term (current) use of anticoagulants 07/28/2024    ERRONEOUS ENCOUNTER--DISREGARD 04/30/2024   Atrial fibrillation with rapid ventricular response (HCC) 06/05/2022   Elevated troponin 06/05/2022   Hypoglycemia 01/26/2022   Coronary artery disease    History of CVA (cerebrovascular accident)    Left knee pain    Generalized weakness    Syncope 06/11/2021   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   Intracranial atherosclerosis 05/11/2021   Cerebral thrombosis with cerebral infarction 05/11/2021   Slurred speech 05/08/2021   Hyperlipidemia, mixed 05/08/2021   Uncontrolled type 2 diabetes mellitus with hypoglycemia, without long-term current use of insulin  (HCC) 05/08/2021   AKI (acute kidney injury) 05/08/2021   Anemia of chronic disease 05/08/2021   CVA (cerebral vascular accident) (HCC) 05/08/2021   Hypertension 08/09/2015   PCP:  System, Provider Not In Pharmacy:   [facility] - [place], [place] - [address] [place] [address] Phone: [phone] Fax: [phone]     Social Drivers of Health (SDOH) Social History: SDOH Screenings   Food Insecurity: No Food Insecurity (10/21/2024)  Housing: Low Risk  (10/21/2024)  Transportation Needs: No Transportation Needs (10/21/2024)  Utilities: Not At Risk (10/21/2024)  Social Connections: Unknown (10/21/2024)  Tobacco Use: Low Risk  (10/20/2024)   SDOH Interventions:     Readmission Risk Interventions    10/21/2024   10:37 AM  Readmission Risk Prevention Plan  Transportation Screening Complete  PCP or Specialist Appt within 3-5 Days Complete  HRI or Home Care Consult Complete  Social Work Consult for Recovery Care Planning/Counseling Complete  Palliative Care Screening Not Applicable  Medication Review Oceanographer) Complete

## 2024-10-21 NOTE — Inpatient Diabetes Management (Signed)
 Inpatient Diabetes Program Recommendations  AACE/ADA: New Consensus Statement on Inpatient Glycemic Control (2015)  Target Ranges:  Prepandial:   less than 140 mg/dL      Peak postprandial:   less than 180 mg/dL (1-2 hours)      Critically ill patients:  140 - 180 mg/dL   Lab Results  Component Value Date   GLUCAP 125 (H) 10/20/2024   HGBA1C 5.9 (H) 01/26/2022    Review of Glycemic Control  Diabetes history: DM 2 Outpatient Diabetes medications: Metformin  1000 mg bid Current orders for Inpatient glycemic control:  Novolog  0-9 units tid + hs  Inpatient Diabetes Program Recommendations:    Lactic acid levels elevated on presentation glucose also 59. Renal function slightly elevated.  -  if glucose trends increase to the point medication is needed would consider Tradjenta outpatient as it is fecally cleared.  Thanks,  Clotilda Bull RN, MSN, BC-ADM Inpatient Diabetes Coordinator Team Pager [phone] (8a-5p)

## 2024-10-21 NOTE — NC FL2 (Signed)
 [place]ISH  MEDICAID FL2 LEVEL OF CARE FORM     IDENTIFICATION  Patient Name: Madison Davidson Birthdate: 1951-09-21 Sex: female Admission Date (Current Location): 10/20/2024  [facility] and [place] Number:  [employer] and Address:  [facility],  [address] [place] [address]      Provider Number: [id number]  Attending Physician Name and Address:  Evonnie Lenis, MD  Relative Name and Phone Number:  trinidad,sabrina (Daughter)  [phone]    Current Level of Care: Hospital Recommended Level of Care: Skilled Nursing Facility Prior Approval Number:    Date Approved/Denied:   PASRR Number: [id number]  Discharge Plan: SNF    Current Diagnoses: Patient Active Problem List   Diagnosis Date Noted   Lethargy 10/21/2024   Acute metabolic encephalopathy 10/21/2024   Lactic acidosis 10/21/2024   Cystitis 10/21/2024   Hypernatremia 10/21/2024   PAF (paroxysmal atrial fibrillation) (HCC) 07/28/2024   Long term (current) use of anticoagulants 07/28/2024   ERRONEOUS ENCOUNTER--DISREGARD 04/30/2024   Atrial fibrillation with rapid ventricular response (HCC) 06/05/2022   Elevated troponin 06/05/2022   Hypoglycemia 01/26/2022   Coronary artery disease    History of CVA (cerebrovascular accident)    Left knee pain    Generalized weakness    Syncope 06/11/2021   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   Intracranial atherosclerosis 05/11/2021   Cerebral thrombosis with cerebral infarction 05/11/2021   Slurred speech 05/08/2021   Hyperlipidemia, mixed 05/08/2021   Uncontrolled type 2 diabetes mellitus with hypoglycemia, without long-term current use of insulin  (HCC) 05/08/2021   AKI (acute kidney injury) 05/08/2021   Anemia of chronic disease 05/08/2021   CVA (cerebral vascular accident) (HCC) 05/08/2021   Hypertension 08/09/2015    Orientation RESPIRATION BLADDER Height & Weight     Self, Place  Normal Incontinent Weight: 135 lb 5.8 oz (61.4  kg) Height:  5' 2 (157.5 cm)  BEHAVIORAL SYMPTOMS/MOOD NEUROLOGICAL BOWEL NUTRITION STATUS      Continent Diet (heart healthy/ carb modified)  AMBULATORY STATUS COMMUNICATION OF NEEDS Skin   Extensive Assist Verbally PU Stage and Appropriate Care (L heel deep tissue pressure injury- evolving with skin sloughing  Deep purple maroon discoloration to tips of L/R great toe with surrounding skin rubor  concern for possible perfusion issues, patient with history of DM II.  Change dressing daily)                       Personal Care Assistance Level of Assistance  Bathing, Feeding, Dressing Bathing Assistance: Maximum assistance Feeding assistance: Limited assistance Dressing Assistance: Maximum assistance     Functional Limitations Info  Sight, Hearing, Speech Sight Info: Adequate Hearing Info: Adequate Speech Info: Adequate    SPECIAL CARE FACTORS FREQUENCY  PT (By licensed PT), OT (By licensed OT)     PT Frequency: 5x/wk OT Frequency: 5x/wk            Contractures Contractures Info: Not present    Additional Factors Info  Code Status, Allergies, Insulin  Sliding Scale Code Status Info: DNR Allergies Info: Augmentin (Amoxicillin-pot Clavulanate), Doxycycline, Lisinopril , Norvasc (Amlodipine)   Insulin  Sliding Scale Info: See MAR       Current Medications (10/21/2024):  This is the current hospital active medication list Current Facility-Administered Medications  Medication Dose Route Frequency Provider Last Rate Last Admin   0.9 %  sodium chloride  infusion   Intravenous Continuous Tat, David, MD 100 mL/hr at 10/21/24 1030 Infusion Verify at 10/21/24 1030   acetaminophen  (TYLENOL )  tablet 500 mg  500 mg Oral Q6H PRN Shona Laurence N, DO       apixaban  (ELIQUIS ) tablet 5 mg  5 mg Oral BID Shona Laurence N, DO   5 mg at 10/21/24 9078   cefTRIAXone  (ROCEPHIN ) 2 g in sodium chloride  0.9 % 100 mL IVPB  2 g Intravenous Q24H Shona Laurence SAILOR, DO   Stopped at 10/21/24 9378    Chlorhexidine  Gluconate Cloth 2 % PADS 6 each  6 each Topical Daily Shona Laurence SAILOR, DO   6 each at 10/21/24 9447   ferrous sulfate  tablet 325 mg  325 mg Oral Q M,W,F Shona Laurence SAILOR, DO   325 mg at 10/21/24 9078   insulin  aspart (novoLOG ) injection 0-5 Units  0-5 Units Subcutaneous QHS Shona Laurence N, DO       insulin  aspart (novoLOG ) injection 0-9 Units  0-9 Units Subcutaneous TID WC Hall, Carole N, DO       levothyroxine  (SYNTHROID ) tablet 75 mcg  75 mcg Oral Q48H Shona Laurence N, DO   75 mcg at 10/21/24 0550   magnesium  sulfate IVPB 4 g 100 mL  4 g Intravenous Once Tat, David, MD 50 mL/hr at 10/21/24 1030 Infusion Verify at 10/21/24 1030   melatonin tablet 6 mg  6 mg Oral QHS PRN Shona Laurence SAILOR, DO       polyethylene glycol (MIRALAX  / GLYCOLAX ) packet 17 g  17 g Oral Daily PRN Shona Laurence N, DO       prochlorperazine  (COMPAZINE ) injection 5 mg  5 mg Intravenous Q6H PRN Shona Laurence SAILOR, DO         Discharge Medications: Please see discharge summary for a list of discharge medications.  Relevant Imaging Results:  Relevant Lab Results:   Additional Information SSN: [id number]  Hoy DELENA Bigness, LCSW

## 2024-10-22 DIAGNOSIS — G9341 Metabolic encephalopathy: Secondary | ICD-10-CM | POA: Diagnosis not present

## 2024-10-22 DIAGNOSIS — I480 Paroxysmal atrial fibrillation: Secondary | ICD-10-CM | POA: Diagnosis not present

## 2024-10-22 DIAGNOSIS — E872 Acidosis, unspecified: Secondary | ICD-10-CM | POA: Diagnosis not present

## 2024-10-22 LAB — GLUCOSE, CAPILLARY
Glucose-Capillary: 138 mg/dL — ABNORMAL HIGH (ref 70–99)
Glucose-Capillary: 158 mg/dL — ABNORMAL HIGH (ref 70–99)
Glucose-Capillary: 46 mg/dL — ABNORMAL LOW (ref 70–99)
Glucose-Capillary: 66 mg/dL — ABNORMAL LOW (ref 70–99)
Glucose-Capillary: 67 mg/dL — ABNORMAL LOW (ref 70–99)
Glucose-Capillary: 97 mg/dL (ref 70–99)
Glucose-Capillary: 144 mg/dL — ABNORMAL HIGH (ref 70–99)

## 2024-10-22 LAB — CBC
HCT: 29.9 % — ABNORMAL LOW (ref 36.0–46.0)
Hemoglobin: 9.1 g/dL — ABNORMAL LOW (ref 12.0–15.0)
MCH: 27.9 pg (ref 26.0–34.0)
MCHC: 30.4 g/dL (ref 30.0–36.0)
MCV: 91.7 fL (ref 80.0–100.0)
Platelets: 309 K/uL (ref 150–400)
RBC: 3.26 MIL/uL — ABNORMAL LOW (ref 3.87–5.11)
RDW: 14.3 % (ref 11.5–15.5)
WBC: 6.8 K/uL (ref 4.0–10.5)
nRBC: 0 % (ref 0.0–0.2)

## 2024-10-22 LAB — COMPREHENSIVE METABOLIC PANEL WITH GFR
ALT: 30 U/L (ref 0–44)
AST: 52 U/L — ABNORMAL HIGH (ref 15–41)
Albumin: 2.3 g/dL — ABNORMAL LOW (ref 3.5–5.0)
Alkaline Phosphatase: 60 U/L (ref 38–126)
Anion gap: 11 (ref 5–15)
BUN: 29 mg/dL — ABNORMAL HIGH (ref 8–23)
CO2: 18 mmol/L — ABNORMAL LOW (ref 22–32)
Calcium: 7.8 mg/dL — ABNORMAL LOW (ref 8.9–10.3)
Chloride: 112 mmol/L — ABNORMAL HIGH (ref 98–111)
Creatinine, Ser: 0.92 mg/dL (ref 0.44–1.00)
GFR, Estimated: >60 mL/min (ref >60)
Glucose, Bld: 93 mg/dL (ref 70–99)
Potassium: 4.1 mmol/L (ref 3.5–5.1)
Sodium: 141 mmol/L (ref 135–145)
Total Bilirubin: <0.2 mg/dL (ref 0.0–1.2)
Total Protein: 5.5 g/dL — ABNORMAL LOW (ref 6.5–8.1)

## 2024-10-22 LAB — FERRITIN: Ferritin: 333 ng/mL — ABNORMAL HIGH (ref 11–307)

## 2024-10-22 LAB — MAGNESIUM: Magnesium: 2.4 mg/dL (ref 1.7–2.4)

## 2024-10-22 NOTE — Plan of Care (Signed)

## 2024-10-22 NOTE — Progress Notes (Signed)
 Pt again found to be hypoglycemic this afternoon. CBG 46, Given 8 oz of apple juice and repeat CBG 67. Pt then proceeded to request to eat icecream and dinner tray. Repeat CBG 97 and pt currently eating Thanksgiving meal brought from home. Daughter at bedside.

## 2024-10-22 NOTE — Progress Notes (Addendum)
 PROGRESS NOTE  Madison Davidson FMW:[id number] DOB: [birth year] DOA: 10/20/2024 PCP: System, Provider Not In  Brief History:  73 year old female with a history of hypertension, right MCA stroke s/p stent 2022, insomnia, diabetes mellitus type 2, hypothyroidism, depression, paroxysmal atrial fibrillation presenting with lethargy.  [facility].  Patient is difficult historian.  Hx supplemented by daughter. She denies any chest pain, shortness breath, abdominal pain, nausea, vomiting, diarrhea. In the ED, the patient was afebrile and hemodynamically stable with oxygen saturation 100% room air.  WBC 9.1, hemoglobin 10.3, platelets 438.  Sodium 146, potassium 4.9, bicarbonate 11, serum creatinine 1.18.  Per EMS, the patient was noted to be hypotensive with glucose in the 50s. Lactic acid >9.0>>7.0.  TSH 4.860.  CTA chest negative for PE or infiltrates.  CT abdomen and pelvis negative for acute findings per chest x-ray is negative for infiltrates.  UA>50 WBC.  Patient was started fluids and ceftriaxone .     Assessment and Plan: Lethargy/acute metabolic encephalopathy - Secondary to dehydration and UTI - Concerned about possible bacteremia - Continue ceftriaxone  2 g daily - Continue IV fluids - B12--331 - folate 5.7>>>replete - Check ammonia--23 - VBG--7.36/42/<31/38 - TSH 4.860 -11/27--improving with IVF and abx   UTI--E coli -UA>50 WBC - Continue ceftriaxone  pending culture data   Lactic acidosis - VBG--7.36/42/<31/38 - lactate >9, now 1.9 - in part due to metformin  - Continue IV fluids>>improved - Continue antibiotics pending culture data   Hypernatremia - Secondary to volume depletion -continue IVF>>improved -follow BMP   Paroxysmal atrial fibrillation - Currently in sinus rhythm - Continue apixaban    Hypothyroidism - Continue levothyroxine    Well-controlled diabetes mellitus type 2 - 01/26/2022 hemoglobin A1c 5.9 - continue novolog   sliding scale   Mixed hyperlipidemia - Hold statin temporarily due to elevated LFT   Major neurocognitive disorder - Daughter states that the patient has had a cognitive and functional decline over the past 6 months               Family Communication:   daughter 11/27   Consultants:  none   Code Status:  DNR   DVT Prophylaxis:  apixaban      Procedures: As Listed in Progress Note Above   Antibiotics: Ceftriaxone  11/25>>          Subjective: Patient denies fevers, chills, headache, chest pain, dyspnea, nausea, vomiting, diarrhea, abdominal pain, dysuria,    Objective: Vitals:   10/22/24 1000 10/22/24 1100 10/22/24 1200 10/22/24 1633  BP: (!) 122/36 (!) 123/36 (!) 183/50   Pulse: 67 (!) 58 78   Resp: (!) 6 (!) 0 18   Temp:    98.1 F (36.7 C)  TempSrc:    Oral  SpO2: 100% 100% 100%   Weight:      Height:        Intake/Output Summary (Last 24 hours) at 10/22/2024 1639 Last data filed at 10/22/2024 1242 Gross per 24 hour  Intake 2032.8 ml  Output 350 ml  Net 1682.8 ml   Weight change:  Exam:  General:  Pt is alert, follows commands appropriately, not in acute distress HEENT: No icterus, No thrush, No neck mass, [place]/AT Cardiovascular: RRR, S1/S2, no rubs, no gallops Respiratory: CTA bilaterally, no wheezing, no crackles, no rhonchi Abdomen: Soft/+BS, non tender, non distended, no guarding Extremities: trace LE edema, No lymphangitis, No petechiae, No rashes, no synovitis   Data Reviewed: I have personally reviewed following labs  and imaging studies Basic Metabolic Panel: Recent Labs  Lab 10/20/24 2156 10/21/24 0921 10/22/24 0508  NA 146* 142 141  K 4.9 5.2* 4.1  CL 108 109 112*  CO2 11* 23 18*  GLUCOSE 110* 95 93  BUN 37* 33* 29*  CREATININE 1.18* 0.95 0.92  CALCIUM  8.8* 8.1* 7.8*  MG  --  1.1* 2.4   Liver Function Tests: Recent Labs  Lab 10/20/24 2156 10/21/24 0921 10/22/24 0508  AST 70* 112* 52*  ALT 30 43 30  ALKPHOS 68 59  60  BILITOT 0.4 <0.2 <0.2  PROT 6.5 6.1* 5.5*  ALBUMIN 3.0* 2.7* 2.3*   No results for input(s): LIPASE, AMYLASE in the last 168 hours. Recent Labs  Lab 10/21/24 0921  AMMONIA 23   Coagulation Profile: No results for input(s): INR, PROTIME in the last 168 hours. CBC: Recent Labs  Lab 10/20/24 2156 10/22/24 0508  WBC 9.1 6.8  HGB 10.3* 9.1*  HCT 35.0* 29.9*  MCV 95.9 91.7  PLT 438* 309   Cardiac Enzymes: Recent Labs  Lab 10/21/24 0921  CKTOTAL 227   BNP: Invalid input(s): POCBNP CBG: Recent Labs  Lab 10/21/24 1622 10/21/24 2301 10/22/24 0737 10/22/24 1104 10/22/24 1626  GLUCAP 91 87 138* 158* 46*   HbA1C: No results for input(s): HGBA1C in the last 72 hours. Urine analysis:    Component Value Date/Time   COLORURINE YELLOW 10/20/2024 2251   APPEARANCEUR HAZY (A) 10/20/2024 2251   LABSPEC 1.027 10/20/2024 2251   PHURINE 5.0 10/20/2024 2251   GLUCOSEU NEGATIVE 10/20/2024 2251   HGBUR SMALL (A) 10/20/2024 2251   BILIRUBINUR NEGATIVE 10/20/2024 2251   KETONESUR 5 (A) 10/20/2024 2251   PROTEINUR 30 (A) 10/20/2024 2251   NITRITE NEGATIVE 10/20/2024 2251   LEUKOCYTESUR MODERATE (A) 10/20/2024 2251   Sepsis Labs: @LABRCNTIP (procalcitonin:4,lacticidven:4) ) Recent Results (from the past 240 hours)  Culture, blood (Routine X 2) w Reflex to ID Panel     Status: None (Preliminary result)   Collection Time: 10/20/24  9:56 PM   Specimen: BLOOD LEFT ARM  Result Value Ref Range Status   Specimen Description BLOOD LEFT ARM  Final   Special Requests   Final    BOTTLES DRAWN AEROBIC AND ANAEROBIC Blood Culture results may not be optimal due to an inadequate volume of blood received in culture bottles   Culture   Final    NO GROWTH 1 DAY Performed at [facility], [address] [place], [place] [address]    Report Status PENDING  Incomplete  Urine Culture (for pregnant, neutropenic or urologic patients or patients with an indwelling urinary catheter)      Status: Abnormal (Preliminary result)   Collection Time: 10/20/24 10:51 PM   Specimen: Urine, Clean Catch  Result Value Ref Range Status   Specimen Description   Final    URINE, CLEAN CATCH Performed at [facility], [address] [place], [place] [address]    Special Requests   Final    NONE Performed at [facility], [address] [place], [place] [address]    Culture (A)  Final    >=100,000 COLONIES/mL ESCHERICHIA COLI SUSCEPTIBILITIES TO FOLLOW Performed at [facility], [address] [place], [place] [address]    Report Status PENDING  Incomplete  MRSA Next Gen by PCR, Nasal     Status: None   Collection Time: 10/21/24  5:18 AM   Specimen: Nasal Mucosa; Nasal Swab  Result Value Ref Range Status   MRSA  by PCR Next Gen NOT DETECTED NOT DETECTED Final    Comment: (NOTE) The GeneXpert MRSA Assay (FDA approved for NASAL specimens only), is one component of a comprehensive MRSA colonization surveillance program. It is not intended to diagnose MRSA infection nor to guide or monitor treatment for MRSA infections. Test performance is not FDA approved in patients less than 53 years old. Performed at [facility], [address] [place], [place] [address]   Culture, blood (Routine X 2) w Reflex to ID Panel     Status: None (Preliminary result)   Collection Time: 10/21/24  5:35 AM   Specimen: BLOOD LEFT ARM  Result Value Ref Range Status   Specimen Description BLOOD LEFT ARM  Final   Special Requests   Final    BOTTLES DRAWN AEROBIC AND ANAEROBIC Blood Culture adequate volume   Culture   Final    NO GROWTH 1 DAY Performed at [facility], [address], [place], [place] [address]    Report Status PENDING  Incomplete     Scheduled Meds:  apixaban   5 mg Oral BID   Chlorhexidine  Gluconate Cloth  6 each Topical Daily   ferrous sulfate   325 mg Oral Q M,W,F   folic acid   1 mg Oral Daily   insulin  aspart  0-5 Units Subcutaneous QHS   insulin  aspart  0-9 Units  Subcutaneous TID WC   levothyroxine   75 mcg Oral Q48H   vitamin B-12  500 mcg Oral Daily   Continuous Infusions:  cefTRIAXone  (ROCEPHIN )  IV 2 g (10/22/24 0508)    Procedures/Studies: CT ABDOMEN PELVIS W CONTRAST Result Date: 10/21/2024 EXAM: CT ABDOMEN AND PELVIS WITH CONTRAST 10/21/2024 12:12:35 AM TECHNIQUE: CT of the abdomen and pelvis was performed with the administration of 100 ml of intravenous contrast (iohexol  (OMNIPAQUE ) 350 MG/ML injection). Multiplanar reformatted images are provided for review. Automated exposure control, iterative reconstruction, and/or weight-based adjustment of the mA/kV was utilized to reduce the radiation dose to as low as reasonably achievable. COMPARISON: 05/16/2024 CLINICAL HISTORY: Increased lethargy and possible sepsis. FINDINGS: LIVER: The liver is within normal limits. GALLBLADDER AND BILE DUCTS: The gallbladder is within normal limits. No biliary ductal dilatation. SPLEEN: The spleen is unremarkable. PANCREAS: The pancreas is unremarkable. ADRENAL GLANDS: The adrenal glands are within normal limits. KIDNEYS, URETERS AND BLADDER: Kidneys demonstrated normal enhancement pattern bilaterally. No renal calculi were observed. The ureters are within normal limits. The bladder is decompressed and unremarkable. No hydronephrosis. GI AND BOWEL: Stomach and small bowel are unremarkable. No obstructive or inflammatory changes of the colon are seen. The appendix is within normal limits. There is no bowel obstruction. PERITONEUM AND RETROPERITONEUM: No free fluid is seen. No free air. VASCULATURE: Aortic calcifications are seen without aneurysmal dilatation. LYMPH NODES: No lymphadenopathy. REPRODUCTIVE ORGANS: The uterus has been surgically removed. BONES AND SOFT TISSUES: No acute bony abnormality is seen. No focal soft tissue abnormality. IMPRESSION: 1. No acute findings in the abdomen or pelvis. Electronically signed by: Oneil Devonshire MD 10/21/2024 12:23 AM EST RP  Workstation: [id number]   CT Angio Chest PE W and/or Wo Contrast Result Date: 10/21/2024 EXAM: CTA of the Chest with contrast for PE 10/21/2024 12:12:35 AM TECHNIQUE: CTA of the chest was performed without and with the administration of 100 mL iohexol  (OMNIPAQUE ) 350 MG/ML injection. Multiplanar reformatted images are provided for review. MIP images are provided for review. Automated exposure control, iterative reconstruction, and/or weight based adjustment of the mA/kV was utilized to reduce the radiation dose to  as low as reasonably achievable. COMPARISON: Chest x-ray from the previous day. CLINICAL HISTORY: Difficulty breathing. FINDINGS: PULMONARY ARTERIES: The pulmonary artery shows a normal branching pattern bilaterally. No filling defect to suggest pulmonary embolism is noted. Main pulmonary artery is normal in caliber. MEDIASTINUM: No cardiac enlargement is seen. Mild coronary calcifications are noted. The thoracic aorta shows atherosclerotic calcifications without aneurysmal dilatation or dissection. The esophagus is within normal limits. LYMPH NODES: No hilar or mediastinal adenopathy is noted. LUNGS AND PLEURA: Lungs are well aerated bilaterally. No focal infiltrate or effusion is seen. No pneumothorax. SOFT TISSUES AND BONES: No acute soft tissue abnormality. No bony abnormality is noted. IMPRESSION: 1. No pulmonary embolism. Electronically signed by: Oneil Devonshire MD 10/21/2024 12:19 AM EST RP Workstation: [id number]   DG Chest Portable 1 View Result Date: 10/20/2024 EXAM: 1 VIEW(S) XRAY OF THE CHEST 10/20/2024 11:04:00 PM COMPARISON: Comparison with 02/29/2024. CLINICAL HISTORY: eval for pneumonia. eval for pneumonia FINDINGS: LUNGS AND PLEURA: Shallow inspiration. No focal pulmonary opacity. No pleural effusion. No pneumothorax. HEART AND MEDIASTINUM: Calcification of the aorta. BONES AND SOFT TISSUES: Degenerative changes in the spine and shoulders. IMPRESSION: 1. No focal airspace  consolidation to suggest pneumonia. Evaluation limited by shallow inspiration. Electronically signed by: Elsie Gravely MD 10/20/2024 11:07 PM EST RP Workstation: [id number]    Alm Schneider, DO  [facility]  If 7PM-7AM, please contact night-coverage [email or web address] Password [facility] 10/22/2024, 4:39 PM   LOS: 1 day

## 2024-10-23 DIAGNOSIS — I480 Paroxysmal atrial fibrillation: Secondary | ICD-10-CM | POA: Diagnosis not present

## 2024-10-23 DIAGNOSIS — N3090 Cystitis, unspecified without hematuria: Secondary | ICD-10-CM | POA: Diagnosis not present

## 2024-10-23 DIAGNOSIS — G9341 Metabolic encephalopathy: Secondary | ICD-10-CM | POA: Diagnosis not present

## 2024-10-23 DIAGNOSIS — E872 Acidosis, unspecified: Secondary | ICD-10-CM | POA: Diagnosis not present

## 2024-10-23 LAB — GLUCOSE, CAPILLARY
Glucose-Capillary: 123 mg/dL — ABNORMAL HIGH (ref 70–99)
Glucose-Capillary: 181 mg/dL — ABNORMAL HIGH (ref 70–99)
Glucose-Capillary: 262 mg/dL — ABNORMAL HIGH (ref 70–99)
Glucose-Capillary: 124 mg/dL — ABNORMAL HIGH (ref 70–99)

## 2024-10-23 LAB — BASIC METABOLIC PANEL WITH GFR
Anion gap: 8 (ref 5–15)
BUN: 18 mg/dL (ref 8–23)
CO2: 23 mmol/L (ref 22–32)
Calcium: 7.8 mg/dL — ABNORMAL LOW (ref 8.9–10.3)
Chloride: 108 mmol/L (ref 98–111)
Creatinine, Ser: 0.74 mg/dL (ref 0.44–1.00)
GFR, Estimated: >60 mL/min (ref >60)
Glucose, Bld: 116 mg/dL — ABNORMAL HIGH (ref 70–99)
Potassium: 4.2 mmol/L (ref 3.5–5.1)
Sodium: 139 mmol/L (ref 135–145)

## 2024-10-23 LAB — URINE CULTURE: Culture: >=100000 — AB

## 2024-10-23 LAB — T4, FREE: Free T4: 0.84 ng/dL (ref 0.61–1.12)

## 2024-10-23 LAB — MAGNESIUM: Magnesium: 1.6 mg/dL — ABNORMAL LOW (ref 1.7–2.4)

## 2024-10-23 MED ORDER — MAGNESIUM SULFATE 2 GM/50ML IV SOLN
2.0000 g | Freq: Once | INTRAVENOUS | Status: DC
  Administered 2024-10-23: 2 g via INTRAVENOUS
  Filled 2024-10-23: qty 50

## 2024-10-23 MED ORDER — CEFADROXIL 500 MG PO CAPS
500.0000 mg | ORAL_CAPSULE | Freq: Two times a day (BID) | ORAL | Status: AC
  Administered 2024-10-23 – 2024-10-26 (×6): 500 mg via ORAL
  Filled 2024-10-23 (×9): qty 1

## 2024-10-23 MED ORDER — METOPROLOL TARTRATE 5 MG/5ML IV SOLN
5.0000 mg | Freq: Once | INTRAVENOUS | Status: AC
  Administered 2024-10-23: 5 mg via INTRAVENOUS
  Filled 2024-10-23: qty 5

## 2024-10-23 MED ORDER — MAGNESIUM SULFATE 2 GM/50ML IV SOLN
2.0000 g | Freq: Once | INTRAVENOUS | Status: AC
  Administered 2024-10-23: 2 g via INTRAVENOUS
  Filled 2024-10-23: qty 50

## 2024-10-23 MED ORDER — PHENOL 1.4 % MT LIQD
1.0000 | OROMUCOSAL | Status: DC | PRN
  Administered 2024-10-23: 1 via OROMUCOSAL
  Filled 2024-10-23: qty 177

## 2024-10-23 MED ORDER — LACTATED RINGERS IV BOLUS
250.0000 mL | Freq: Once | INTRAVENOUS | Status: AC
  Administered 2024-10-23: 250 mL via INTRAVENOUS

## 2024-10-23 MED ORDER — DILTIAZEM LOAD VIA INFUSION
20.0000 mg | Freq: Once | INTRAVENOUS | Status: AC
  Administered 2024-10-23: 20 mg via INTRAVENOUS
  Filled 2024-10-23: qty 20

## 2024-10-23 MED ORDER — METOPROLOL TARTRATE 5 MG/5ML IV SOLN
5.0000 mg | INTRAVENOUS | Status: AC | PRN
  Administered 2024-10-23 (×2): 5 mg via INTRAVENOUS
  Filled 2024-10-23 (×2): qty 5

## 2024-10-23 MED ORDER — MAGNESIUM SULFATE 4 GM/100ML IV SOLN: 4.0000 g | Freq: Once | INTRAVENOUS | Status: DC

## 2024-10-23 MED ORDER — DILTIAZEM HCL 25 MG/5ML IV SOLN
INTRAVENOUS | Status: AC
  Administered 2024-10-23: 15 mg via INTRAVENOUS
  Filled 2024-10-23: qty 5

## 2024-10-23 MED ORDER — DILTIAZEM HCL 25 MG/5ML IV SOLN: 15.0000 mg | Freq: Once | INTRAVENOUS | Status: AC

## 2024-10-23 MED ORDER — DILTIAZEM HCL-DEXTROSE 125-5 MG/125ML-% IV SOLN (PREMIX)
5.0000 mg/h | INTRAVENOUS | Status: DC
  Administered 2024-10-23: 15 mg/h via INTRAVENOUS
  Administered 2024-10-23: 5 mg/h via INTRAVENOUS
  Administered 2024-10-23 – 2024-10-25 (×6): 15 mg/h via INTRAVENOUS
  Filled 2024-10-23 (×8): qty 125

## 2024-10-23 NOTE — Significant Event (Signed)
       CROSS COVER NOTE  NAME: Madison Davidson MRN: [id number] DOB : 05-30-1951 ATTENDING PHYSICIAN: Evonnie Lenis, MD    Date of Service   10/23/2024   HPI/Events of Note   [facility] Cross Cover at [facility] Noteified by nurse heart rate in 150s HPI reviewed history labs imaging reviewed; has history of paroxysmal afib with RVR, not on rate controlling med but is on eliquis   Interventions   Assessment/Plan: Vitals:   10/23/24 0330 10/23/24 0420  BP: (!) 184/87   Pulse: (!) 126 (!) 168  Resp: (!) 21 13  Temp:  98 F (36.7 C)  SpO2: 100% 99%  EKG a fib RVR 167, no STE  Patient without symptoms - alert and oriented Denies chest pain or shortness of breath Does endorses some anxiety since awakened out of sound sleep but denies need for med for her anxiety Resp unlabored and sats stable on room air   PAF RVR Dilt infusion initiated after dilt bolus x2 - no significant rate improvement Metoprolol 5 mg x 2 doses, modest rate improvement - 250 LR bolus also given for med induced hypotension Additional 250 LR bolus and 5 mg metop   CRITICAL CARE Performed by: Erminio Cone   Total critical care time: 30 minutes  Critical care time was exclusive of separately billable procedures and treating other patients.  Critical care was necessary to treat or prevent imminent or life-threatening deterioration.  Critical care was time spent personally by me on the following activities: development of treatment plan with patient and/or surrogate as well as nursing, discussions with consultants, evaluation of patient's response to treatment, examination of patient, obtaining history from patient or surrogate, ordering and performing treatments and interventions, ordering and review of laboratory studies, ordering and review of radiographic studies, pulse oximetry and re-evaluation of patient's condition.      Erminio LITTIE Cone NP [facility] Cross Cover 7pm-7am - check amion  for availability Pager [phone]

## 2024-10-23 NOTE — Progress Notes (Incomplete)
 Patient went to Afib RVR HR 150s, asymptomatic, NP made aware. BP 140s systolically. Given Cardizem  15mg  IV, Started on Cardizem  gtt and given 20mg  of bolus. NP at bedside

## 2024-10-23 NOTE — Plan of Care (Signed)
  Problem: Metabolic: Goal: Ability to maintain appropriate glucose levels will improve Outcome: Progressing   Problem: Nutritional: Goal: Maintenance of adequate nutrition will improve Outcome: Progressing   Problem: Clinical Measurements: Goal: Cardiovascular complication will be avoided Outcome: Not Progressing

## 2024-10-23 NOTE — Progress Notes (Signed)
 PROGRESS NOTE  Madison Davidson FMW:[id number] DOB: 09-15-1951 DOA: 10/20/2024 PCP: System, Provider Not In  Brief History:  73 year old female with a history of hypertension, right MCA stroke s/p stent 2022, insomnia, diabetes mellitus type 2, hypothyroidism, depression, paroxysmal atrial fibrillation presenting with lethargy.  [facility].  Patient is difficult historian.  Hx supplemented by daughter. She denies any chest pain, shortness breath, abdominal pain, nausea, vomiting, diarrhea. In the ED, the patient was afebrile and hemodynamically stable with oxygen saturation 100% room air.  WBC 9.1, hemoglobin 10.3, platelets 438.  Sodium 146, potassium 4.9, bicarbonate 11, serum creatinine 1.18.  Per EMS, the patient was noted to be hypotensive with glucose in the 50s. Lactic acid >9.0>>7.0.  TSH 4.860.  CTA chest negative for PE or infiltrates.  CT abdomen and pelvis negative for acute findings per chest x-ray is negative for infiltrates.  UA>50 WBC.  Patient was started fluids and ceftriaxone .   Assessment/Plan: Lethargy/acute metabolic encephalopathy - Secondary to dehydration and UTI - blood cultures neg - Continue ceftriaxone  2 g daily - Continue IV fluids - B12--331 - folate 5.7>>>replete - Check ammonia--23 - VBG--7.36/42/<31/38 - TSH 4.860 -11/27--improving with IVF and abx   UTI--E coli -UA>50 WBC - initially ceftriaxone  pending culture data - 11/28--transition to po cefadroxil   Paroxysmal atrial fibrillation with RVR - 11/28 early am--developed afib with RVR - started diltiazem  drip - will accept HR up to 120s given acute medical condition - Continue apixaban  - optimize K and Mag  Lactic acidosis - VBG--7.36/42/<31/38 - lactate >9, now 1.9 - in part due to metformin  - Continue IV fluids>>improved - Continue antibiotics pending culture data   Hypernatremia - Secondary to volume depletion -continue IVF>>improved -follow  BMP   Hypothyroidism - Continue levothyroxine    Well-controlled diabetes mellitus type 2 - 01/26/2022 hemoglobin A1c 5.9 - continue novolog  sliding scale - repeat A1C   Mixed hyperlipidemia - Hold statin temporarily due to elevated LFT   Major neurocognitive disorder - Daughter states that the patient has had a cognitive and functional decline over the past 6 months  Hypomagnesemia -replete               Family Communication:   daughter 11/29   Consultants:  none   Code Status:  DNR   DVT Prophylaxis:  apixaban      Procedures: As Listed in Progress Note Above   Antibiotics: Ceftriaxone  11/25>>      The patient is critically ill with multiple organ systems failure and requires high complexity decision making for assessment and support, frequent evaluation and titration of therapies, application of advanced monitoring technologies and extensive interpretation of multiple databases.  Critical care time - 35 mins. In addition to the 30 min spent by Erminio Cone, NP    Subjective: Patient denies fevers, chills, headache, chest pain, dyspnea, nausea, vomiting, diarrhea, abdominal pain, dysuria, hematuria, hematochezia, and melena.   Objective: Vitals:   10/23/24 1330 10/23/24 1345 10/23/24 1500 10/23/24 1530  BP: (!) 117/46 (!) 96/50 124/77 96/60  Pulse:      Resp: 17 13 20    Temp:      TempSrc:      SpO2: 97% 100% 95%   Weight:      Height:        Intake/Output Summary (Last 24 hours) at 10/23/2024 1651 Last data filed at 10/23/2024 1645 Gross per 24 hour  Intake 1104.06 ml  Output 1000  ml  Net 104.06 ml   Weight change:  Exam:  General:  Pt is alert, follows commands appropriately, not in acute distress HEENT: No icterus, No thrush, No neck mass, [place]/AT Cardiovascular: IRRR, S1/S2, no rubs, no gallops Respiratory: CTA bilaterally, no wheezing, no crackles, no rhonchi Abdomen: Soft/+BS, non tender, non distended, no guarding Extremities: No  edema, No lymphangitis, No petechiae, No rashes, no synovitis   Data Reviewed: I have personally reviewed following labs and imaging studies Basic Metabolic Panel: Recent Labs  Lab 10/20/24 2156 10/21/24 0921 10/22/24 0508 10/23/24 0543  NA 146* 142 141 139  K 4.9 5.2* 4.1 4.2  CL 108 109 112* 108  CO2 11* 23 18* 23  GLUCOSE 110* 95 93 116*  BUN 37* 33* 29* 18  CREATININE 1.18* 0.95 0.92 0.74  CALCIUM  8.8* 8.1* 7.8* 7.8*  MG  --  1.1* 2.4 1.6*   Liver Function Tests: Recent Labs  Lab 10/20/24 2156 10/21/24 0921 10/22/24 0508  AST 70* 112* 52*  ALT 30 43 30  ALKPHOS 68 59 60  BILITOT 0.4 <0.2 <0.2  PROT 6.5 6.1* 5.5*  ALBUMIN 3.0* 2.7* 2.3*   No results for input(s): LIPASE, AMYLASE in the last 168 hours. Recent Labs  Lab 10/21/24 0921  AMMONIA 23   Coagulation Profile: No results for input(s): INR, PROTIME in the last 168 hours. CBC: Recent Labs  Lab 10/20/24 2156 10/22/24 0508  WBC 9.1 6.8  HGB 10.3* 9.1*  HCT 35.0* 29.9*  MCV 95.9 91.7  PLT 438* 309   Cardiac Enzymes: Recent Labs  Lab 10/21/24 0921  CKTOTAL 227   BNP: Invalid input(s): POCBNP CBG: Recent Labs  Lab 10/22/24 1800 10/22/24 2127 10/23/24 0720 10/23/24 1139 10/23/24 1601  GLUCAP 97 144* 123* 181* 262*   HbA1C: No results for input(s): HGBA1C in the last 72 hours. Urine analysis:    Component Value Date/Time   COLORURINE YELLOW 10/20/2024 2251   APPEARANCEUR HAZY (A) 10/20/2024 2251   LABSPEC 1.027 10/20/2024 2251   PHURINE 5.0 10/20/2024 2251   GLUCOSEU NEGATIVE 10/20/2024 2251   HGBUR SMALL (A) 10/20/2024 2251   BILIRUBINUR NEGATIVE 10/20/2024 2251   KETONESUR 5 (A) 10/20/2024 2251   PROTEINUR 30 (A) 10/20/2024 2251   NITRITE NEGATIVE 10/20/2024 2251   LEUKOCYTESUR MODERATE (A) 10/20/2024 2251   Sepsis Labs: @LABRCNTIP (procalcitonin:4,lacticidven:4) ) Recent Results (from the past 240 hours)  Culture, blood (Routine X 2) w Reflex to ID Panel      Status: None (Preliminary result)   Collection Time: 10/20/24  9:56 PM   Specimen: BLOOD LEFT ARM  Result Value Ref Range Status   Specimen Description BLOOD LEFT ARM  Final   Special Requests   Final    BOTTLES DRAWN AEROBIC AND ANAEROBIC Blood Culture results may not be optimal due to an inadequate volume of blood received in culture bottles   Culture   Final    NO GROWTH 2 DAYS Performed at [facility], [address], [place], [place] [address]    Report Status PENDING  Incomplete  Urine Culture (for pregnant, neutropenic or urologic patients or patients with an indwelling urinary catheter)     Status: Abnormal   Collection Time: 10/20/24 10:51 PM   Specimen: Urine, Clean Catch  Result Value Ref Range Status   Specimen Description   Final    URINE, CLEAN CATCH Performed at [facility], [address] [place], [place] [address]    Special Requests   Final  NONE Performed at [facility], [address] [place], [place] [address]    Culture >=100,000 COLONIES/mL ESCHERICHIA COLI (A)  Final   Report Status 10/23/2024 FINAL  Final   Organism ID, Bacteria ESCHERICHIA COLI (A)  Final      Susceptibility   Escherichia coli - MIC*    AMPICILLIN 8 SENSITIVE Sensitive     CEFAZOLIN (URINE) Value in next row Sensitive      <=1 SENSITIVEThis is a modified FDA-approved test that has been validated and its performance characteristics determined by the reporting laboratory.  This laboratory is certified under the Clinical Laboratory Improvement Amendments CLIA as qualified to perform high complexity clinical laboratory testing.    CEFEPIME Value in next row Sensitive      <=1 SENSITIVEThis is a modified FDA-approved test that has been validated and its performance characteristics determined by the reporting laboratory.  This laboratory is certified under the Clinical Laboratory Improvement Amendments CLIA as qualified to perform high complexity clinical laboratory testing.    ERTAPENEM  Value in next row Sensitive      <=1 SENSITIVEThis is a modified FDA-approved test that has been validated and its performance characteristics determined by the reporting laboratory.  This laboratory is certified under the Clinical Laboratory Improvement Amendments CLIA as qualified to perform high complexity clinical laboratory testing.    CEFTRIAXONE  Value in next row Sensitive      <=1 SENSITIVEThis is a modified FDA-approved test that has been validated and its performance characteristics determined by the reporting laboratory.  This laboratory is certified under the Clinical Laboratory Improvement Amendments CLIA as qualified to perform high complexity clinical laboratory testing.    CIPROFLOXACIN Value in next row Sensitive      <=1 SENSITIVEThis is a modified FDA-approved test that has been validated and its performance characteristics determined by the reporting laboratory.  This laboratory is certified under the Clinical Laboratory Improvement Amendments CLIA as qualified to perform high complexity clinical laboratory testing.    GENTAMICIN Value in next row Sensitive      <=1 SENSITIVEThis is a modified FDA-approved test that has been validated and its performance characteristics determined by the reporting laboratory.  This laboratory is certified under the Clinical Laboratory Improvement Amendments CLIA as qualified to perform high complexity clinical laboratory testing.    NITROFURANTOIN  Value in next row Sensitive      <=1 SENSITIVEThis is a modified FDA-approved test that has been validated and its performance characteristics determined by the reporting laboratory.  This laboratory is certified under the Clinical Laboratory Improvement Amendments CLIA as qualified to perform high complexity clinical laboratory testing.    TRIMETH/SULFA Value in next row Sensitive      <=1 SENSITIVEThis is a modified FDA-approved test that has been validated and its performance characteristics determined by  the reporting laboratory.  This laboratory is certified under the Clinical Laboratory Improvement Amendments CLIA as qualified to perform high complexity clinical laboratory testing.    AMPICILLIN/SULBACTAM Value in next row Sensitive      <=1 SENSITIVEThis is a modified FDA-approved test that has been validated and its performance characteristics determined by the reporting laboratory.  This laboratory is certified under the Clinical Laboratory Improvement Amendments CLIA as qualified to perform high complexity clinical laboratory testing.    PIP/TAZO Value in next row Sensitive      <=4 SENSITIVEThis is a modified FDA-approved test that has been validated and its performance characteristics determined by the reporting laboratory.  This laboratory is certified under the  Clinical Laboratory Improvement Amendments CLIA as qualified to perform high complexity clinical laboratory testing.    MEROPENEM Value in next row Sensitive      <=4 SENSITIVEThis is a modified FDA-approved test that has been validated and its performance characteristics determined by the reporting laboratory.  This laboratory is certified under the Clinical Laboratory Improvement Amendments CLIA as qualified to perform high complexity clinical laboratory testing.    * >=100,000 COLONIES/mL ESCHERICHIA COLI  MRSA Next Gen by PCR, Nasal     Status: None   Collection Time: 10/21/24  5:18 AM   Specimen: Nasal Mucosa; Nasal Swab  Result Value Ref Range Status   MRSA by PCR Next Gen NOT DETECTED NOT DETECTED Final    Comment: (NOTE) The GeneXpert MRSA Assay (FDA approved for NASAL specimens only), is one component of a comprehensive MRSA colonization surveillance program. It is not intended to diagnose MRSA infection nor to guide or monitor treatment for MRSA infections. Test performance is not FDA approved in patients less than 85 years old. Performed at [facility], [address] [place], [place] [address]   Culture, blood  (Routine X 2) w Reflex to ID Panel     Status: None (Preliminary result)   Collection Time: 10/21/24  5:35 AM   Specimen: BLOOD LEFT ARM  Result Value Ref Range Status   Specimen Description BLOOD LEFT ARM  Final   Special Requests   Final    BOTTLES DRAWN AEROBIC AND ANAEROBIC Blood Culture adequate volume   Culture   Final    NO GROWTH 2 DAYS Performed at [facility], [address] [place], [place] [address]    Report Status PENDING  Incomplete     Scheduled Meds:  apixaban   5 mg Oral BID   Chlorhexidine  Gluconate Cloth  6 each Topical Daily   ferrous sulfate   325 mg Oral Q M,W,F   folic acid   1 mg Oral Daily   insulin  aspart  0-5 Units Subcutaneous QHS   insulin  aspart  0-9 Units Subcutaneous TID WC   levothyroxine   75 mcg Oral Q48H   vitamin B-12  500 mcg Oral Daily   Continuous Infusions:  cefTRIAXone  (ROCEPHIN )  IV Stopped (10/23/24 1411)   diltiazem  (CARDIZEM ) infusion 15 mg/hr (10/23/24 1640)   magnesium  sulfate bolus IVPB 2 g (10/23/24 1644)    Procedures/Studies: CT ABDOMEN PELVIS W CONTRAST Result Date: 10/21/2024 EXAM: CT ABDOMEN AND PELVIS WITH CONTRAST 10/21/2024 12:12:35 AM TECHNIQUE: CT of the abdomen and pelvis was performed with the administration of 100 ml of intravenous contrast (iohexol  (OMNIPAQUE ) 350 MG/ML injection). Multiplanar reformatted images are provided for review. Automated exposure control, iterative reconstruction, and/or weight-based adjustment of the mA/kV was utilized to reduce the radiation dose to as low as reasonably achievable. COMPARISON: 05/16/2024 CLINICAL HISTORY: Increased lethargy and possible sepsis. FINDINGS: LIVER: The liver is within normal limits. GALLBLADDER AND BILE DUCTS: The gallbladder is within normal limits. No biliary ductal dilatation. SPLEEN: The spleen is unremarkable. PANCREAS: The pancreas is unremarkable. ADRENAL GLANDS: The adrenal glands are within normal limits. KIDNEYS, URETERS AND BLADDER: Kidneys demonstrated  normal enhancement pattern bilaterally. No renal calculi were observed. The ureters are within normal limits. The bladder is decompressed and unremarkable. No hydronephrosis. GI AND BOWEL: Stomach and small bowel are unremarkable. No obstructive or inflammatory changes of the colon are seen. The appendix is within normal limits. There is no bowel obstruction. PERITONEUM AND RETROPERITONEUM: No free fluid is seen. No free air. VASCULATURE: Aortic calcifications are  seen without aneurysmal dilatation. LYMPH NODES: No lymphadenopathy. REPRODUCTIVE ORGANS: The uterus has been surgically removed. BONES AND SOFT TISSUES: No acute bony abnormality is seen. No focal soft tissue abnormality. IMPRESSION: 1. No acute findings in the abdomen or pelvis. Electronically signed by: Oneil Devonshire MD 10/21/2024 12:23 AM EST RP Workstation: [id number]   CT Angio Chest PE W and/or Wo Contrast Result Date: 10/21/2024 EXAM: CTA of the Chest with contrast for PE 10/21/2024 12:12:35 AM TECHNIQUE: CTA of the chest was performed without and with the administration of 100 mL iohexol  (OMNIPAQUE ) 350 MG/ML injection. Multiplanar reformatted images are provided for review. MIP images are provided for review. Automated exposure control, iterative reconstruction, and/or weight based adjustment of the mA/kV was utilized to reduce the radiation dose to as low as reasonably achievable. COMPARISON: Chest x-ray from the previous day. CLINICAL HISTORY: Difficulty breathing. FINDINGS: PULMONARY ARTERIES: The pulmonary artery shows a normal branching pattern bilaterally. No filling defect to suggest pulmonary embolism is noted. Main pulmonary artery is normal in caliber. MEDIASTINUM: No cardiac enlargement is seen. Mild coronary calcifications are noted. The thoracic aorta shows atherosclerotic calcifications without aneurysmal dilatation or dissection. The esophagus is within normal limits. LYMPH NODES: No hilar or mediastinal adenopathy is noted.  LUNGS AND PLEURA: Lungs are well aerated bilaterally. No focal infiltrate or effusion is seen. No pneumothorax. SOFT TISSUES AND BONES: No acute soft tissue abnormality. No bony abnormality is noted. IMPRESSION: 1. No pulmonary embolism. Electronically signed by: Oneil Devonshire MD 10/21/2024 12:19 AM EST RP Workstation: [id number]   DG Chest Portable 1 View Result Date: 10/20/2024 EXAM: 1 VIEW(S) XRAY OF THE CHEST 10/20/2024 11:04:00 PM COMPARISON: Comparison with 02/29/2024. CLINICAL HISTORY: eval for pneumonia. eval for pneumonia FINDINGS: LUNGS AND PLEURA: Shallow inspiration. No focal pulmonary opacity. No pleural effusion. No pneumothorax. HEART AND MEDIASTINUM: Calcification of the aorta. BONES AND SOFT TISSUES: Degenerative changes in the spine and shoulders. IMPRESSION: 1. No focal airspace consolidation to suggest pneumonia. Evaluation limited by shallow inspiration. Electronically signed by: Elsie Gravely MD 10/20/2024 11:07 PM EST RP Workstation: [id number]    Alm Schneider, DO  [facility]  If 7PM-7AM, please contact night-coverage [email or web address] Password [email or web address] 10/23/2024, 4:51 PM   LOS: 2 days

## 2024-10-23 NOTE — Progress Notes (Incomplete)
 Patient went to Afib RVR, HR 150s. Asymptomatic, no chest pain and palpitations. NP made aware. Blood pressure stable. Given Cardizem  15mg  IV and started pt on Cardizem  gtt. Given bolus as ordered and titrated accordingly. NP at bedside.

## 2024-10-23 NOTE — TOC Progression Note (Signed)
 Transition of Care [facility]) - Progression Note    Patient Details  Name: Madison Davidson MRN: [id number] Date of Birth: 10-24-1951  Transition of Care [facility]) CM/SW Contact  Hoy DELENA Bigness, LCSW Phone Number: 10/23/2024, 11:00 AM  Clinical Narrative:    CSW met with pt to review bed offer for SNF. Pt has accepted SNF at [facility].  CSW left VM for pt's daughter, Samule to provide update.  Pt able to transfer once medically stable.   [facility] and [facility] [address] [place], [place] [address] [phone] Overall rating ?????   Expected Discharge Plan: Skilled Nursing Facility Barriers to Discharge: Continued Medical Work up               Expected Discharge Plan and Services In-house Referral: Clinical Social Work Discharge Planning Services: NA Post Acute Care Choice: Skilled Nursing Facility Living arrangements for the past 2 months: Assisted Living Facility                 DME Arranged: N/A DME Agency: NA                   Social Drivers of Health (SDOH) Interventions SDOH Screenings   Food Insecurity: No Food Insecurity (10/21/2024)  Housing: Low Risk  (10/21/2024)  Transportation Needs: No Transportation Needs (10/21/2024)  Utilities: Not At Risk (10/21/2024)  Social Connections: Unknown (10/21/2024)  Tobacco Use: Low Risk  (10/20/2024)    Readmission Risk Interventions    10/21/2024   10:37 AM  Readmission Risk Prevention Plan  Transportation Screening Complete  PCP or Specialist Appt within 3-5 Days Complete  HRI or Home Care Consult Complete  Social Work Consult for Recovery Care Planning/Counseling Complete  Palliative Care Screening Not Applicable  Medication Review Oceanographer) Complete

## 2024-10-24 DIAGNOSIS — I480 Paroxysmal atrial fibrillation: Secondary | ICD-10-CM | POA: Diagnosis not present

## 2024-10-24 DIAGNOSIS — E870 Hyperosmolality and hypernatremia: Secondary | ICD-10-CM | POA: Diagnosis not present

## 2024-10-24 DIAGNOSIS — G9341 Metabolic encephalopathy: Secondary | ICD-10-CM | POA: Diagnosis not present

## 2024-10-24 LAB — HEMOGLOBIN A1C
Hgb A1c MFr Bld: 5.3 % (ref 4.8–5.6)
Mean Plasma Glucose: 105 mg/dL

## 2024-10-24 LAB — BASIC METABOLIC PANEL WITH GFR
Anion gap: 8 (ref 5–15)
BUN: 12 mg/dL (ref 8–23)
CO2: 24 mmol/L (ref 22–32)
Calcium: 7.9 mg/dL — ABNORMAL LOW (ref 8.9–10.3)
Chloride: 106 mmol/L (ref 98–111)
Creatinine, Ser: 0.66 mg/dL (ref 0.44–1.00)
GFR, Estimated: >60 mL/min (ref >60)
Glucose, Bld: 108 mg/dL — ABNORMAL HIGH (ref 70–99)
Potassium: 4 mmol/L (ref 3.5–5.1)
Sodium: 138 mmol/L (ref 135–145)

## 2024-10-24 LAB — GLUCOSE, CAPILLARY
Glucose-Capillary: 115 mg/dL — ABNORMAL HIGH (ref 70–99)
Glucose-Capillary: 247 mg/dL — ABNORMAL HIGH (ref 70–99)
Glucose-Capillary: 147 mg/dL — ABNORMAL HIGH (ref 70–99)
Glucose-Capillary: 146 mg/dL — ABNORMAL HIGH (ref 70–99)

## 2024-10-24 LAB — MAGNESIUM: Magnesium: 2.7 mg/dL — ABNORMAL HIGH (ref 1.7–2.4)

## 2024-10-24 NOTE — Progress Notes (Signed)
 PROGRESS NOTE  Madison Davidson FMW:[id number] DOB: 02/09/1951 DOA: 10/20/2024 PCP: System, Provider Not In  Brief History:  73 year old female with a history of hypertension, right MCA stroke s/p stent 2022, insomnia, diabetes mellitus type 2, hypothyroidism, depression, paroxysmal atrial fibrillation presenting with lethargy.  [facility].  Patient is difficult historian.  Hx supplemented by daughter. She denies any chest pain, shortness breath, abdominal pain, nausea, vomiting, diarrhea. In the ED, the patient was afebrile and hemodynamically stable with oxygen saturation 100% room air.  WBC 9.1, hemoglobin 10.3, platelets 438.  Sodium 146, potassium 4.9, bicarbonate 11, serum creatinine 1.18.  Per EMS, the patient was noted to be hypotensive with glucose in the 50s. Lactic acid >9.0>>7.0.  TSH 4.860.  CTA chest negative for PE or infiltrates.  CT abdomen and pelvis negative for acute findings per chest x-ray is negative for infiltrates.  UA>50 WBC.  Patient was started fluids and ceftriaxone .   Assessment/Plan: Lethargy/acute metabolic encephalopathy - Secondary to dehydration and UTI - blood cultures neg to date - Continue ceftriaxone  2 g daily>>cefadroxil - Continue IV fluids - B12--331 - folate 5.7>>>replete - Check ammonia--23 - VBG--7.36/42/<31/38 - TSH 4.860 -11/28--back to baseline   UTI--E coli -UA>50 WBC - initially ceftriaxone  pending culture data - 11/28--transition to po cefadroxil   Paroxysmal atrial fibrillation with RVR - 11/28 early am--developed afib with RVR - continue diltiazem  drip - will accept HR up to 120s given acute medical condition - Continue apixaban  - optimize K and Mag   Lactic acidosis - VBG--7.36/42/<31/38 - lactate >9, now 1.9 - blood cultures neg x 2 - in part due to metformin  - Continue IV fluids>>improved - Continued antibiotics pending culture data   Hypernatremia - Secondary to volume  depletion -continue IVF>>improved -follow BMP   Hypothyroidism - Continue levothyroxine    Well-controlled diabetes mellitus type 2 - 01/26/2022 hemoglobin A1c 5.9 - continue novolog  sliding scale - repeat A1C   Mixed hyperlipidemia - Hold statin temporarily due to elevated LFT   Major neurocognitive disorder - Daughter states that the patient has had a cognitive and functional decline over the past 6 months   Hypomagnesemia -repleted               Family Communication:   daughter 11/29   Consultants:  none   Code Status:  DNR   DVT Prophylaxis:  apixaban      Procedures: As Listed in Progress Note Above   Antibiotics: Ceftriaxone  11/25>>11/28 Cefadroxil 11/28>>       Subjective: Patient denies fevers, chills, headache, chest pain, dyspnea, nausea, vomiting, diarrhea, abdominal pain, dysuria, hematuria, hematochezia, and melena.   Objective: Vitals:   10/24/24 0830 10/24/24 1040 10/24/24 1149 10/24/24 1220  BP: 96/70 106/60 112/64   Pulse: 84 67  73  Resp: 15 19 19 14   Temp:    (!) 97.4 F (36.3 C)  TempSrc:    Oral  SpO2: 98% 98%  99%  Weight:      Height:        Intake/Output Summary (Last 24 hours) at 10/24/2024 1326 Last data filed at 10/24/2024 0901 Gross per 24 hour  Intake 482.31 ml  Output 1400 ml  Net -917.69 ml   Weight change:  Exam:  General:  Pt is alert, follows commands appropriately, not in acute distress HEENT: No icterus, No thrush, No neck mass, [place]/AT Cardiovascular: RRR, S1/S2, no rubs, no gallops Respiratory: CTA bilaterally, no wheezing,  no crackles, no rhonchi Abdomen: Soft/+BS, non tender, non distended, no guarding Extremities: No edema, No lymphangitis, No petechiae, No rashes, no synovitis   Data Reviewed: I have personally reviewed following labs and imaging studies Basic Metabolic Panel: Recent Labs  Lab 10/20/24 2156 10/21/24 0921 10/22/24 0508 10/23/24 0543 10/24/24 0300  NA 146* 142 141 139 138   K 4.9 5.2* 4.1 4.2 4.0  CL 108 109 112* 108 106  CO2 11* 23 18* 23 24  GLUCOSE 110* 95 93 116* 108*  BUN 37* 33* 29* 18 12  CREATININE 1.18* 0.95 0.92 0.74 0.66  CALCIUM  8.8* 8.1* 7.8* 7.8* 7.9*  MG  --  1.1* 2.4 1.6* 2.7*   Liver Function Tests: Recent Labs  Lab 10/20/24 2156 10/21/24 0921 10/22/24 0508  AST 70* 112* 52*  ALT 30 43 30  ALKPHOS 68 59 60  BILITOT 0.4 <0.2 <0.2  PROT 6.5 6.1* 5.5*  ALBUMIN 3.0* 2.7* 2.3*   No results for input(s): LIPASE, AMYLASE in the last 168 hours. Recent Labs  Lab 10/21/24 0921  AMMONIA 23   Coagulation Profile: No results for input(s): INR, PROTIME in the last 168 hours. CBC: Recent Labs  Lab 10/20/24 2156 10/22/24 0508  WBC 9.1 6.8  HGB 10.3* 9.1*  HCT 35.0* 29.9*  MCV 95.9 91.7  PLT 438* 309   Cardiac Enzymes: Recent Labs  Lab 10/21/24 0921  CKTOTAL 227   BNP: Invalid input(s): POCBNP CBG: Recent Labs  Lab 10/23/24 1139 10/23/24 1601 10/23/24 2132 10/24/24 0735 10/24/24 1124  GLUCAP 181* 262* 124* 115* 247*   HbA1C: Recent Labs    10/22/24 0508  HGBA1C 5.3   Urine analysis:    Component Value Date/Time   COLORURINE YELLOW 10/20/2024 2251   APPEARANCEUR HAZY (A) 10/20/2024 2251   LABSPEC 1.027 10/20/2024 2251   PHURINE 5.0 10/20/2024 2251   GLUCOSEU NEGATIVE 10/20/2024 2251   HGBUR SMALL (A) 10/20/2024 2251   BILIRUBINUR NEGATIVE 10/20/2024 2251   KETONESUR 5 (A) 10/20/2024 2251   PROTEINUR 30 (A) 10/20/2024 2251   NITRITE NEGATIVE 10/20/2024 2251   LEUKOCYTESUR MODERATE (A) 10/20/2024 2251   Sepsis Labs: @LABRCNTIP (procalcitonin:4,lacticidven:4) ) Recent Results (from the past 240 hours)  Culture, blood (Routine X 2) w Reflex to ID Panel     Status: None (Preliminary result)   Collection Time: 10/20/24  9:56 PM   Specimen: BLOOD LEFT ARM  Result Value Ref Range Status   Specimen Description BLOOD LEFT ARM  Final   Special Requests   Final    BOTTLES DRAWN AEROBIC AND ANAEROBIC  Blood Culture results may not be optimal due to an inadequate volume of blood received in culture bottles   Culture   Final    NO GROWTH 3 DAYS Performed at [facility], [address] [place], [place] [address]    Report Status PENDING  Incomplete  Urine Culture (for pregnant, neutropenic or urologic patients or patients with an indwelling urinary catheter)     Status: Abnormal   Collection Time: 10/20/24 10:51 PM   Specimen: Urine, Clean Catch  Result Value Ref Range Status   Specimen Description   Final    URINE, CLEAN CATCH Performed at [facility], [address], [place], [place] [address]    Special Requests   Final    NONE Performed at [facility], [address], [place], [place] [address]    Culture >=100,000 COLONIES/mL ESCHERICHIA COLI (A)  Final   Report Status 10/23/2024 FINAL  Final  Organism ID, Bacteria ESCHERICHIA COLI (A)  Final      Susceptibility   Escherichia coli - MIC*    AMPICILLIN 8 SENSITIVE Sensitive     CEFAZOLIN (URINE) Value in next row Sensitive      <=1 SENSITIVEThis is a modified FDA-approved test that has been validated and its performance characteristics determined by the reporting laboratory.  This laboratory is certified under the Clinical Laboratory Improvement Amendments CLIA as qualified to perform high complexity clinical laboratory testing.    CEFEPIME Value in next row Sensitive      <=1 SENSITIVEThis is a modified FDA-approved test that has been validated and its performance characteristics determined by the reporting laboratory.  This laboratory is certified under the Clinical Laboratory Improvement Amendments CLIA as qualified to perform high complexity clinical laboratory testing.    ERTAPENEM Value in next row Sensitive      <=1 SENSITIVEThis is a modified FDA-approved test that has been validated and its performance characteristics determined by the reporting laboratory.  This laboratory is certified under the Clinical Laboratory  Improvement Amendments CLIA as qualified to perform high complexity clinical laboratory testing.    CEFTRIAXONE  Value in next row Sensitive      <=1 SENSITIVEThis is a modified FDA-approved test that has been validated and its performance characteristics determined by the reporting laboratory.  This laboratory is certified under the Clinical Laboratory Improvement Amendments CLIA as qualified to perform high complexity clinical laboratory testing.    CIPROFLOXACIN Value in next row Sensitive      <=1 SENSITIVEThis is a modified FDA-approved test that has been validated and its performance characteristics determined by the reporting laboratory.  This laboratory is certified under the Clinical Laboratory Improvement Amendments CLIA as qualified to perform high complexity clinical laboratory testing.    GENTAMICIN Value in next row Sensitive      <=1 SENSITIVEThis is a modified FDA-approved test that has been validated and its performance characteristics determined by the reporting laboratory.  This laboratory is certified under the Clinical Laboratory Improvement Amendments CLIA as qualified to perform high complexity clinical laboratory testing.    NITROFURANTOIN  Value in next row Sensitive      <=1 SENSITIVEThis is a modified FDA-approved test that has been validated and its performance characteristics determined by the reporting laboratory.  This laboratory is certified under the Clinical Laboratory Improvement Amendments CLIA as qualified to perform high complexity clinical laboratory testing.    TRIMETH/SULFA Value in next row Sensitive      <=1 SENSITIVEThis is a modified FDA-approved test that has been validated and its performance characteristics determined by the reporting laboratory.  This laboratory is certified under the Clinical Laboratory Improvement Amendments CLIA as qualified to perform high complexity clinical laboratory testing.    AMPICILLIN/SULBACTAM Value in next row Sensitive       <=1 SENSITIVEThis is a modified FDA-approved test that has been validated and its performance characteristics determined by the reporting laboratory.  This laboratory is certified under the Clinical Laboratory Improvement Amendments CLIA as qualified to perform high complexity clinical laboratory testing.    PIP/TAZO Value in next row Sensitive      <=4 SENSITIVEThis is a modified FDA-approved test that has been validated and its performance characteristics determined by the reporting laboratory.  This laboratory is certified under the Clinical Laboratory Improvement Amendments CLIA as qualified to perform high complexity clinical laboratory testing.    MEROPENEM Value in next row Sensitive      <=4 SENSITIVEThis is a modified  FDA-approved test that has been validated and its performance characteristics determined by the reporting laboratory.  This laboratory is certified under the Clinical Laboratory Improvement Amendments CLIA as qualified to perform high complexity clinical laboratory testing.    * >=100,000 COLONIES/mL ESCHERICHIA COLI  MRSA Next Gen by PCR, Nasal     Status: None   Collection Time: 10/21/24  5:18 AM   Specimen: Nasal Mucosa; Nasal Swab  Result Value Ref Range Status   MRSA by PCR Next Gen NOT DETECTED NOT DETECTED Final    Comment: (NOTE) The GeneXpert MRSA Assay (FDA approved for NASAL specimens only), is one component of a comprehensive MRSA colonization surveillance program. It is not intended to diagnose MRSA infection nor to guide or monitor treatment for MRSA infections. Test performance is not FDA approved in patients less than 81 years old. Performed at [facility], [address], [place], [place] [address]   Culture, blood (Routine X 2) w Reflex to ID Panel     Status: None (Preliminary result)   Collection Time: 10/21/24  5:35 AM   Specimen: BLOOD LEFT ARM  Result Value Ref Range Status   Specimen Description BLOOD LEFT ARM  Final   Special Requests   Final     BOTTLES DRAWN AEROBIC AND ANAEROBIC Blood Culture adequate volume   Culture   Final    NO GROWTH 3 DAYS Performed at [facility], [address], [place], [place] [address]    Report Status PENDING  Incomplete     Scheduled Meds:  apixaban   5 mg Oral BID   cefadroxil   500 mg Oral BID   Chlorhexidine  Gluconate Cloth  6 each Topical Daily   ferrous sulfate   325 mg Oral Q M,W,F   folic acid   1 mg Oral Daily   insulin  aspart  0-5 Units Subcutaneous QHS   insulin  aspart  0-9 Units Subcutaneous TID WC   levothyroxine   75 mcg Oral Q48H   vitamin B-12  500 mcg Oral Daily   Continuous Infusions:  diltiazem  (CARDIZEM ) infusion 15 mg/hr (10/24/24 1146)    Procedures/Studies: CT ABDOMEN PELVIS W CONTRAST Result Date: 10/21/2024 EXAM: CT ABDOMEN AND PELVIS WITH CONTRAST 10/21/2024 12:12:35 AM TECHNIQUE: CT of the abdomen and pelvis was performed with the administration of 100 ml of intravenous contrast (iohexol  (OMNIPAQUE ) 350 MG/ML injection). Multiplanar reformatted images are provided for review. Automated exposure control, iterative reconstruction, and/or weight-based adjustment of the mA/kV was utilized to reduce the radiation dose to as low as reasonably achievable. COMPARISON: 05/16/2024 CLINICAL HISTORY: Increased lethargy and possible sepsis. FINDINGS: LIVER: The liver is within normal limits. GALLBLADDER AND BILE DUCTS: The gallbladder is within normal limits. No biliary ductal dilatation. SPLEEN: The spleen is unremarkable. PANCREAS: The pancreas is unremarkable. ADRENAL GLANDS: The adrenal glands are within normal limits. KIDNEYS, URETERS AND BLADDER: Kidneys demonstrated normal enhancement pattern bilaterally. No renal calculi were observed. The ureters are within normal limits. The bladder is decompressed and unremarkable. No hydronephrosis. GI AND BOWEL: Stomach and small bowel are unremarkable. No obstructive or inflammatory changes of the colon are seen. The appendix is within normal  limits. There is no bowel obstruction. PERITONEUM AND RETROPERITONEUM: No free fluid is seen. No free air. VASCULATURE: Aortic calcifications are seen without aneurysmal dilatation. LYMPH NODES: No lymphadenopathy. REPRODUCTIVE ORGANS: The uterus has been surgically removed. BONES AND SOFT TISSUES: No acute bony abnormality is seen. No focal soft tissue abnormality. IMPRESSION: 1. No acute findings in the abdomen or pelvis. Electronically signed by:  Oneil Devonshire MD 10/21/2024 12:23 AM EST RP Workstation: [id number]   CT Angio Chest PE W and/or Wo Contrast Result Date: 10/21/2024 EXAM: CTA of the Chest with contrast for PE 10/21/2024 12:12:35 AM TECHNIQUE: CTA of the chest was performed without and with the administration of 100 mL iohexol  (OMNIPAQUE ) 350 MG/ML injection. Multiplanar reformatted images are provided for review. MIP images are provided for review. Automated exposure control, iterative reconstruction, and/or weight based adjustment of the mA/kV was utilized to reduce the radiation dose to as low as reasonably achievable. COMPARISON: Chest x-ray from the previous day. CLINICAL HISTORY: Difficulty breathing. FINDINGS: PULMONARY ARTERIES: The pulmonary artery shows a normal branching pattern bilaterally. No filling defect to suggest pulmonary embolism is noted. Main pulmonary artery is normal in caliber. MEDIASTINUM: No cardiac enlargement is seen. Mild coronary calcifications are noted. The thoracic aorta shows atherosclerotic calcifications without aneurysmal dilatation or dissection. The esophagus is within normal limits. LYMPH NODES: No hilar or mediastinal adenopathy is noted. LUNGS AND PLEURA: Lungs are well aerated bilaterally. No focal infiltrate or effusion is seen. No pneumothorax. SOFT TISSUES AND BONES: No acute soft tissue abnormality. No bony abnormality is noted. IMPRESSION: 1. No pulmonary embolism. Electronically signed by: Oneil Devonshire MD 10/21/2024 12:19 AM EST RP Workstation:  [id number]   DG Chest Portable 1 View Result Date: 10/20/2024 EXAM: 1 VIEW(S) XRAY OF THE CHEST 10/20/2024 11:04:00 PM COMPARISON: Comparison with 02/29/2024. CLINICAL HISTORY: eval for pneumonia. eval for pneumonia FINDINGS: LUNGS AND PLEURA: Shallow inspiration. No focal pulmonary opacity. No pleural effusion. No pneumothorax. HEART AND MEDIASTINUM: Calcification of the aorta. BONES AND SOFT TISSUES: Degenerative changes in the spine and shoulders. IMPRESSION: 1. No focal airspace consolidation to suggest pneumonia. Evaluation limited by shallow inspiration. Electronically signed by: Elsie Gravely MD 10/20/2024 11:07 PM EST RP Workstation: [id number]    Alm Schneider, DO  [facility]  If 7PM-7AM, please contact night-coverage [email or web address] Password [email or web address] 10/24/2024, 1:26 PM   LOS: 3 days

## 2024-10-24 NOTE — Plan of Care (Signed)

## 2024-10-25 DIAGNOSIS — N3090 Cystitis, unspecified without hematuria: Secondary | ICD-10-CM | POA: Diagnosis not present

## 2024-10-25 DIAGNOSIS — G9341 Metabolic encephalopathy: Secondary | ICD-10-CM | POA: Diagnosis not present

## 2024-10-25 DIAGNOSIS — I480 Paroxysmal atrial fibrillation: Secondary | ICD-10-CM | POA: Diagnosis not present

## 2024-10-25 LAB — BASIC METABOLIC PANEL WITH GFR
Anion gap: 9 (ref 5–15)
BUN: 10 mg/dL (ref 8–23)
CO2: 24 mmol/L (ref 22–32)
Calcium: 8 mg/dL — ABNORMAL LOW (ref 8.9–10.3)
Chloride: 106 mmol/L (ref 98–111)
Creatinine, Ser: 0.62 mg/dL (ref 0.44–1.00)
GFR, Estimated: >60 mL/min (ref >60)
Glucose, Bld: 120 mg/dL — ABNORMAL HIGH (ref 70–99)
Potassium: 4 mmol/L (ref 3.5–5.1)
Sodium: 138 mmol/L (ref 135–145)

## 2024-10-25 LAB — GLUCOSE, CAPILLARY
Glucose-Capillary: 114 mg/dL — ABNORMAL HIGH (ref 70–99)
Glucose-Capillary: 139 mg/dL — ABNORMAL HIGH (ref 70–99)
Glucose-Capillary: 138 mg/dL — ABNORMAL HIGH (ref 70–99)
Glucose-Capillary: 163 mg/dL — ABNORMAL HIGH (ref 70–99)

## 2024-10-25 LAB — HEMOGLOBIN A1C
Hgb A1c MFr Bld: 5.5 % (ref 4.8–5.6)
Mean Plasma Glucose: 111 mg/dL

## 2024-10-25 LAB — MAGNESIUM: Magnesium: 2.2 mg/dL (ref 1.7–2.4)

## 2024-10-25 MED ORDER — DILTIAZEM HCL 60 MG PO TABS
90.0000 mg | ORAL_TABLET | Freq: Four times a day (QID) | ORAL | Status: DC
  Administered 2024-10-25 – 2024-10-26 (×5): 90 mg via ORAL
  Filled 2024-10-25 (×5): qty 1

## 2024-10-25 MED ORDER — MELATONIN 3 MG PO TABS: 6.0000 mg | ORAL_TABLET | Freq: Every evening | ORAL | Status: AC | PRN

## 2024-10-25 MED ORDER — FOLIC ACID 1 MG PO TABS: 1.0000 mg | ORAL_TABLET | Freq: Every day | ORAL | Status: DC

## 2024-10-25 MED ORDER — MAGNESIUM OXIDE -MG SUPPLEMENT 400 (240 MG) MG PO TABS
400.0000 mg | ORAL_TABLET | Freq: Every day | ORAL | Status: DC
  Administered 2024-10-25 – 2024-10-29 (×5): 400 mg via ORAL
  Filled 2024-10-25 (×5): qty 1

## 2024-10-25 MED ORDER — CYANOCOBALAMIN 500 MCG PO TABS: 500.0000 ug | ORAL_TABLET | Freq: Every day | ORAL | Status: DC

## 2024-10-25 MED ORDER — POLYETHYLENE GLYCOL 3350 17 G PO PACK: 17.0000 g | PACK | Freq: Every day | ORAL | Status: AC | PRN

## 2024-10-25 NOTE — Plan of Care (Signed)

## 2024-10-25 NOTE — Discharge Summary (Incomplete)
 Physician Discharge Summary   Patient: Madison Davidson MRN: [id number] DOB: 05-Feb-1951  Admit date:     10/20/2024  Discharge date: 10/26/2024  Discharge Physician: Madison Davidson   PCP: Madison Davidson   Recommendations at discharge:   Please follow up with primary care provider within 1-2 weeks  Please repeat BMP and CBC Davidson one week    Hospital Course: 73 year old female with a history of hypertension, right MCA stroke s/p stent 2022, insomnia, diabetes mellitus type 2, hypothyroidism, depression, paroxysmal atrial fibrillation presenting with lethargy.  [facility]  Patient is difficult historian.  Hx supplemented by daughter. She denies any chest pain, shortness breath, abdominal pain, nausea, vomiting, diarrhea. Davidson the ED, the patient was afebrile and hemodynamically stable with oxygen saturation 100% room air.  WBC 9.1, hemoglobin 10.3, platelets 438.  Sodium 146, potassium 4.9, bicarbonate 11, serum creatinine 1.18.  Per EMS, the patient was noted to be hypotensive with glucose Davidson the 50s. Lactic acid >9.0>>7.0.  TSH 4.860.  CTA chest negative for PE or infiltrates.  CT abdomen and pelvis negative for acute findings per chest x-ray is negative for infiltrates.  UA>50 WBC.  Patient was started fluids and ceftriaxone . Her mental status improved and returned to baseline.  Her hospitalization was prolonged due to development of afib with RVR.  She was started on IV diltiazem  with gradual improvement of her HR as her metabolic abnormalities improved.  Her IV diltiazem  was converted to po which she tolerated.  Assessment and Plan: Lethargy/acute metabolic encephalopathy - Secondary to dehydration and UTI - blood cultures neg to date - Continue ceftriaxone  2 g daily>>cefadroxil - Continue IV fluids - B12--331 - folate 5.7>>>replete - Check ammonia--23 - VBG--7.36/42/<31/38 - TSH 4.860 -11/28--back to baseline   UTI--E coli -UA>50 WBC - initially  ceftriaxone  pending culture data - 11/28--transition to po cefadroxil -completed 7 days of abx   Paroxysmal atrial fibrillation with RVR - 11/28 early am--developed afib with RVR - continue diltiazem  drip>>transitioned to po diltiazem  on 11/30 - will accept HR up to 120s given acute medical condition - Continue apixaban  - optimize K and Mag   Lactic acidosis - VBG--7.36/42/<31/38 - lactate >9, now 1.9 - blood cultures neg x 2 - Davidson part due to metformin  - Continue IV fluids>>improved - Continued antibiotics pending culture data   Hypernatremia - Secondary to volume depletion -continue IVF>>improved -follow BMP   Hypothyroidism - Continue levothyroxine    Well-controlled diabetes mellitus type 2 - 01/26/2022 hemoglobin A1c 5.9 - continue novolog  sliding scale - repeat A1C   Mixed hyperlipidemia - Hold statin temporarily due to elevated LFT - restart statin at time of d/c   Major neurocognitive disorder - Daughter states that the patient has had a cognitive and functional decline over the past 6 months   Hypomagnesemia -repleted    {Tip this will not be part of the note when signed Body mass index is 24.76 kg/m. , ,  Active Pressure Injury/Wound(s)     None          (Optional):26781}   Consultants: none Procedures performed: none  Disposition: Skilled nursing facility Diet recommendation:  Cardiac diet DISCHARGE MEDICATION: Allergies as of 10/25/2024       Reactions   Augmentin [amoxicillin-pot Clavulanate] Other (See Comments)   No reaction listed on MAR   Doxycycline Other (See Comments)   No reaction listed on MAR   Lisinopril  Other (See Comments)   No reaction listed on [facility]  Norvasc [amlodipine] Other (See Comments)   No reaction listed on MAR     Med Rec must be completed prior to using this [facility]***       Contact information for after-discharge care     Destination     [facility] .   Service: Skilled Nursing Contact  information: [address] [place]  [address] [phone]                    Discharge Exam: Madison Davidson   10/20/24 2130 10/21/24 0532  Weight: 65.9 kg 61.4 kg   HEENT:  [place]/AT, No thrush, no icterus CV:  IRRR, no rub, no S3, no S4 Lung:  CTA, no wheeze, no rhonchi Abd:  soft/+BS, NT Ext:  No edema, no lymphangitis, no synovitis, no rash   Condition at discharge: stable  The results of significant diagnostics from this hospitalization (including imaging, microbiology, ancillary and laboratory) are listed below for reference.   Imaging Studies: CT ABDOMEN PELVIS W CONTRAST Result Date: 10/21/2024 EXAM: CT ABDOMEN AND PELVIS WITH CONTRAST 10/21/2024 12:12:35 AM TECHNIQUE: CT of the abdomen and pelvis was performed with the administration of 100 ml of intravenous contrast (iohexol  (OMNIPAQUE ) 350 MG/ML injection). Multiplanar reformatted images are provided for review. Automated exposure control, iterative reconstruction, and/or weight-based adjustment of the mA/kV was utilized to reduce the radiation dose to as low as reasonably achievable. COMPARISON: 05/16/2024 CLINICAL HISTORY: Increased lethargy and possible sepsis. FINDINGS: LIVER: The liver is within normal limits. GALLBLADDER AND BILE DUCTS: The gallbladder is within normal limits. No biliary ductal dilatation. SPLEEN: The spleen is unremarkable. PANCREAS: The pancreas is unremarkable. ADRENAL GLANDS: The adrenal glands are within normal limits. KIDNEYS, URETERS AND BLADDER: Kidneys demonstrated normal enhancement pattern bilaterally. No renal calculi were observed. The ureters are within normal limits. The bladder is decompressed and unremarkable. No hydronephrosis. GI AND BOWEL: Stomach and small bowel are unremarkable. No obstructive or inflammatory changes of the colon are seen. The appendix is within normal limits. There is no bowel obstruction. PERITONEUM AND RETROPERITONEUM: No free fluid is seen. No  free air. VASCULATURE: Aortic calcifications are seen without aneurysmal dilatation. LYMPH NODES: No lymphadenopathy. REPRODUCTIVE ORGANS: The uterus has been surgically removed. BONES AND SOFT TISSUES: No acute bony abnormality is seen. No focal soft tissue abnormality. IMPRESSION: 1. No acute findings Davidson the abdomen or pelvis. Electronically signed by: Oneil Devonshire MD 10/21/2024 12:23 AM EST RP Workstation: [id number]   CT Angio Chest PE W and/or Wo Contrast Result Date: 10/21/2024 EXAM: CTA of the Chest with contrast for PE 10/21/2024 12:12:35 AM TECHNIQUE: CTA of the chest was performed without and with the administration of 100 mL iohexol  (OMNIPAQUE ) 350 MG/ML injection. Multiplanar reformatted images are provided for review. MIP images are provided for review. Automated exposure control, iterative reconstruction, and/or weight based adjustment of the mA/kV was utilized to reduce the radiation dose to as low as reasonably achievable. COMPARISON: Chest x-ray from the previous day. CLINICAL HISTORY: Difficulty breathing. FINDINGS: PULMONARY ARTERIES: The pulmonary artery shows a normal branching pattern bilaterally. No filling defect to suggest pulmonary embolism is noted. Main pulmonary artery is normal Davidson caliber. MEDIASTINUM: No cardiac enlargement is seen. Mild coronary calcifications are noted. The thoracic aorta shows atherosclerotic calcifications without aneurysmal dilatation or dissection. The esophagus is within normal limits. LYMPH NODES: No hilar or mediastinal adenopathy is noted. LUNGS AND PLEURA: Lungs are well aerated bilaterally. No focal infiltrate or effusion is seen. No pneumothorax. SOFT TISSUES AND  BONES: No acute soft tissue abnormality. No bony abnormality is noted. IMPRESSION: 1. No pulmonary embolism. Electronically signed by: Oneil Devonshire MD 10/21/2024 12:19 AM EST RP Workstation: [id number]   DG Chest Portable 1 View Result Date: 10/20/2024 EXAM: 1 VIEW(S) XRAY OF THE CHEST  10/20/2024 11:04:00 PM COMPARISON: Comparison with 02/29/2024. CLINICAL HISTORY: eval for pneumonia. eval for pneumonia FINDINGS: LUNGS AND PLEURA: Shallow inspiration. No focal pulmonary opacity. No pleural effusion. No pneumothorax. HEART AND MEDIASTINUM: Calcification of the aorta. BONES AND SOFT TISSUES: Degenerative changes Davidson the spine and shoulders. IMPRESSION: 1. No focal airspace consolidation to suggest pneumonia. Evaluation limited by shallow inspiration. Electronically signed by: Elsie Gravely MD 10/20/2024 11:07 PM EST RP Workstation: [id number]    Microbiology: Results for orders placed or performed during the hospital encounter of 10/20/24  Culture, blood (Routine X 2) w Reflex to ID Panel     Status: None (Preliminary result)   Collection Time: 10/20/24  9:56 PM   Specimen: BLOOD LEFT ARM  Result Value Ref Range Status   Specimen Description BLOOD LEFT ARM  Final   Special Requests   Final    BOTTLES DRAWN AEROBIC AND ANAEROBIC Blood Culture results may not be optimal due to an inadequate volume of blood received Davidson culture bottles   Culture   Final    NO GROWTH 4 DAYS Performed at [facility], [address] [place], [place] [address]    Report Status PENDING  Incomplete  Urine Culture (for pregnant, neutropenic or urologic patients or patients with an indwelling urinary catheter)     Status: Abnormal   Collection Time: 10/20/24 10:51 PM   Specimen: Urine, Clean Catch  Result Value Ref Range Status   Specimen Description   Final    URINE, CLEAN CATCH Performed at [facility], [address], [place], [place] [address]    Special Requests   Final    NONE Performed at [facility] [address], [place], [place] [address]    Culture >=100,000 COLONIES/mL ESCHERICHIA COLI (A)  Final   Report Status 10/23/2024 FINAL  Final   Organism ID, Bacteria ESCHERICHIA COLI (A)  Final      Susceptibility   Escherichia coli - MIC*    AMPICILLIN 8 SENSITIVE Sensitive      CEFAZOLIN (URINE) Value Davidson next row Sensitive      <=1 SENSITIVEThis is a modified FDA-approved test that has been validated and its performance characteristics determined by the reporting laboratory.  This laboratory is certified under the Clinical Laboratory Improvement Amendments CLIA as qualified to perform high complexity clinical laboratory testing.    CEFEPIME Value Davidson next row Sensitive      <=1 SENSITIVEThis is a modified FDA-approved test that has been validated and its performance characteristics determined by the reporting laboratory.  This laboratory is certified under the Clinical Laboratory Improvement Amendments CLIA as qualified to perform high complexity clinical laboratory testing.    ERTAPENEM Value Davidson next row Sensitive      <=1 SENSITIVEThis is a modified FDA-approved test that has been validated and its performance characteristics determined by the reporting laboratory.  This laboratory is certified under the Clinical Laboratory Improvement Amendments CLIA as qualified to perform high complexity clinical laboratory testing.    CEFTRIAXONE  Value Davidson next row Sensitive      <=1 SENSITIVEThis is a modified FDA-approved test that has been validated and its performance characteristics determined by the reporting laboratory.  This laboratory is certified under the Clinical Laboratory Improvement Amendments  CLIA as qualified to perform high complexity clinical laboratory testing.    CIPROFLOXACIN Value Davidson next row Sensitive      <=1 SENSITIVEThis is a modified FDA-approved test that has been validated and its performance characteristics determined by the reporting laboratory.  This laboratory is certified under the Clinical Laboratory Improvement Amendments CLIA as qualified to perform high complexity clinical laboratory testing.    GENTAMICIN Value Davidson next row Sensitive      <=1 SENSITIVEThis is a modified FDA-approved test that has been validated and its performance characteristics  determined by the reporting laboratory.  This laboratory is certified under the Clinical Laboratory Improvement Amendments CLIA as qualified to perform high complexity clinical laboratory testing.    NITROFURANTOIN  Value Davidson next row Sensitive      <=1 SENSITIVEThis is a modified FDA-approved test that has been validated and its performance characteristics determined by the reporting laboratory.  This laboratory is certified under the Clinical Laboratory Improvement Amendments CLIA as qualified to perform high complexity clinical laboratory testing.    TRIMETH/SULFA Value Davidson next row Sensitive      <=1 SENSITIVEThis is a modified FDA-approved test that has been validated and its performance characteristics determined by the reporting laboratory.  This laboratory is certified under the Clinical Laboratory Improvement Amendments CLIA as qualified to perform high complexity clinical laboratory testing.    AMPICILLIN/SULBACTAM Value Davidson next row Sensitive      <=1 SENSITIVEThis is a modified FDA-approved test that has been validated and its performance characteristics determined by the reporting laboratory.  This laboratory is certified under the Clinical Laboratory Improvement Amendments CLIA as qualified to perform high complexity clinical laboratory testing.    PIP/TAZO Value Davidson next row Sensitive      <=4 SENSITIVEThis is a modified FDA-approved test that has been validated and its performance characteristics determined by the reporting laboratory.  This laboratory is certified under the Clinical Laboratory Improvement Amendments CLIA as qualified to perform high complexity clinical laboratory testing.    MEROPENEM Value Davidson next row Sensitive      <=4 SENSITIVEThis is a modified FDA-approved test that has been validated and its performance characteristics determined by the reporting laboratory.  This laboratory is certified under the Clinical Laboratory Improvement Amendments CLIA as qualified to perform  high complexity clinical laboratory testing.    * >=100,000 COLONIES/mL ESCHERICHIA COLI  MRSA Next Gen by PCR, Nasal     Status: None   Collection Time: 10/21/24  5:18 AM   Specimen: Nasal Mucosa; Nasal Swab  Result Value Ref Range Status   MRSA by PCR Next Gen NOT DETECTED NOT DETECTED Final    Comment: (NOTE) The GeneXpert MRSA Assay (FDA approved for NASAL specimens only), is one component of a comprehensive MRSA colonization surveillance program. It is not intended to diagnose MRSA infection nor to guide or monitor treatment for MRSA infections. Test performance is not FDA approved Davidson patients less than 35 years old. Performed at [facility], [address], [place], [place] [address]   Culture, blood (Routine X 2) w Reflex to ID Panel     Status: None (Preliminary result)   Collection Time: 10/21/24  5:35 AM   Specimen: BLOOD LEFT ARM  Result Value Ref Range Status   Specimen Description BLOOD LEFT ARM  Final   Special Requests   Final    BOTTLES DRAWN AEROBIC AND ANAEROBIC Blood Culture adequate volume   Culture   Final    NO GROWTH 4 DAYS Performed at  [facility], [address], [place], [place] [address]    Report Status PENDING  Incomplete    Labs: CBC: Recent Labs  Lab 10/20/24 2156 10/22/24 0508  WBC 9.1 6.8  HGB 10.3* 9.1*  HCT 35.0* 29.9*  MCV 95.9 91.7  PLT 438* 309   Basic Metabolic Panel: Recent Labs  Lab 10/21/24 0921 10/22/24 0508 10/23/24 0543 10/24/24 0300 10/25/24 0412  NA 142 141 139 138 138  K 5.2* 4.1 4.2 4.0 4.0  CL 109 112* 108 106 106  CO2 23 18* 23 24 24   GLUCOSE 95 93 116* 108* 120*  BUN 33* 29* 18 12 10   CREATININE 0.95 0.92 0.74 0.66 0.62  CALCIUM  8.1* 7.8* 7.8* 7.9* 8.0*  MG 1.1* 2.4 1.6* 2.7* 2.2   Liver Function Tests: Recent Labs  Lab 10/20/24 2156 10/21/24 0921 10/22/24 0508  AST 70* 112* 52*  ALT 30 43 30  ALKPHOS 68 59 60  BILITOT 0.4 <0.2 <0.2  PROT 6.5 6.1* 5.5*  ALBUMIN 3.0* 2.7* 2.3*   CBG: Recent  Labs  Lab 10/24/24 1618 10/24/24 2119 10/25/24 0736 10/25/24 1119 10/25/24 1628  GLUCAP 147* 146* 114* 139* 138*    Discharge time spent: greater than 30 minutes.  Signed: Alm Schneider, MD [facility] 10/25/2024

## 2024-10-25 NOTE — Progress Notes (Signed)
 PROGRESS NOTE  Madison Davidson FMW:[id number] DOB: 1951/05/02 DOA: 10/20/2024 PCP: System, Provider Not In  Brief History:  73 year old female with a history of hypertension, right MCA stroke s/p stent 2022, insomnia, diabetes mellitus type 2, hypothyroidism, depression, paroxysmal atrial fibrillation presenting with lethargy.  [facility]  Patient is difficult historian.  Hx supplemented by daughter. She denies any chest pain, shortness breath, abdominal pain, nausea, vomiting, diarrhea. In the ED, the patient was afebrile and hemodynamically stable with oxygen saturation 100% room air.  WBC 9.1, hemoglobin 10.3, platelets 438.  Sodium 146, potassium 4.9, bicarbonate 11, serum creatinine 1.18.  Per EMS, the patient was noted to be hypotensive with glucose in the 50s. Lactic acid >9.0>>7.0.  TSH 4.860.  CTA chest negative for PE or infiltrates.  CT abdomen and pelvis negative for acute findings per chest x-ray is negative for infiltrates.  UA>50 WBC.  Patient was started fluids and ceftriaxone .   Assessment/Plan: Lethargy/acute metabolic encephalopathy - Secondary to dehydration and UTI - blood cultures neg to date - Continue ceftriaxone  2 g daily>>cefadroxil - Continue IV fluids - B12--331 - folate 5.7>>>replete - Check ammonia--23 - VBG--7.36/42/<31/38 - TSH 4.860 -11/28--back to baseline   UTI--E coli -UA>50 WBC - initially ceftriaxone  pending culture data - 11/28--transition to po cefadroxil   Paroxysmal atrial fibrillation with RVR - 11/28 early am--developed afib with RVR - continue diltiazem  drip - will accept HR up to 120s given acute medical condition - Continue apixaban  - optimize K and Mag   Lactic acidosis - VBG--7.36/42/<31/38 - lactate >9, now 1.9 - blood cultures neg x 2 - in part due to metformin  - Continue IV fluids>>improved - Continued antibiotics pending culture data   Hypernatremia - Secondary to volume  depletion -continue IVF>>improved -follow BMP   Hypothyroidism - Continue levothyroxine    Well-controlled diabetes mellitus type 2 - 01/26/2022 hemoglobin A1c 5.9 - continue novolog  sliding scale - repeat A1C   Mixed hyperlipidemia - Hold statin temporarily due to elevated LFT   Major neurocognitive disorder - Daughter states that the patient has had a cognitive and functional decline over the past 6 months   Hypomagnesemia -repleted               Family Communication:   daughter 11/30   Consultants:  none   Code Status:  DNR   DVT Prophylaxis:  apixaban      Procedures: As Listed in Progress Note Above   Antibiotics: Ceftriaxone  11/25>>11/28 Cefadroxil 11/28>>             Subjective: Patient denies fevers, chills, headache, chest pain, dyspnea, nausea, vomiting, diarrhea, abdominal pain, dysuria, hematuria, hematochezia, and melena.   Objective: Vitals:   10/25/24 0913 10/25/24 1000 10/25/24 1100 10/25/24 1130  BP: 118/82 119/62 (!) 105/55 124/84  Pulse:  91  77  Resp: 17 13 20 18   Temp:      TempSrc:      SpO2: 98% 99% 99% 100%  Weight:      Height:        Intake/Output Summary (Last 24 hours) at 10/25/2024 1312 Last data filed at 10/25/2024 9177 Gross per 24 hour  Intake 863.19 ml  Output 450 ml  Net 413.19 ml   Weight change:  Exam:  General:  Pt is alert, follows commands appropriately, not in acute distress HEENT: No icterus, No thrush, No neck mass, [place]/AT Cardiovascular: IRRR, S1/S2, no rubs, no gallops Respiratory: CTA  bilaterally, no wheezing, no crackles, no rhonchi Abdomen: Soft/+BS, non tender, non distended, no guarding Extremities: No edema, No lymphangitis, No petechiae, No rashes, no synovitis   Data Reviewed: I have personally reviewed following labs and imaging studies Basic Metabolic Panel: Recent Labs  Lab 10/21/24 0921 10/22/24 0508 10/23/24 0543 10/24/24 0300 10/25/24 0412  NA 142 141 139 138 138  K  5.2* 4.1 4.2 4.0 4.0  CL 109 112* 108 106 106  CO2 23 18* 23 24 24   GLUCOSE 95 93 116* 108* 120*  BUN 33* 29* 18 12 10   CREATININE 0.95 0.92 0.74 0.66 0.62  CALCIUM  8.1* 7.8* 7.8* 7.9* 8.0*  MG 1.1* 2.4 1.6* 2.7* 2.2   Liver Function Tests: Recent Labs  Lab 10/20/24 2156 10/21/24 0921 10/22/24 0508  AST 70* 112* 52*  ALT 30 43 30  ALKPHOS 68 59 60  BILITOT 0.4 <0.2 <0.2  PROT 6.5 6.1* 5.5*  ALBUMIN 3.0* 2.7* 2.3*   No results for input(s): LIPASE, AMYLASE in the last 168 hours. Recent Labs  Lab 10/21/24 0921  AMMONIA 23   Coagulation Profile: No results for input(s): INR, PROTIME in the last 168 hours. CBC: Recent Labs  Lab 10/20/24 2156 10/22/24 0508  WBC 9.1 6.8  HGB 10.3* 9.1*  HCT 35.0* 29.9*  MCV 95.9 91.7  PLT 438* 309   Cardiac Enzymes: Recent Labs  Lab 10/21/24 0921  CKTOTAL 227   BNP: Invalid input(s): POCBNP CBG: Recent Labs  Lab 10/24/24 1124 10/24/24 1618 10/24/24 2119 10/25/24 0736 10/25/24 1119  GLUCAP 247* 147* 146* 114* 139*   HbA1C: No results for input(s): HGBA1C in the last 72 hours. Urine analysis:    Component Value Date/Time   COLORURINE YELLOW 10/20/2024 2251   APPEARANCEUR HAZY (A) 10/20/2024 2251   LABSPEC 1.027 10/20/2024 2251   PHURINE 5.0 10/20/2024 2251   GLUCOSEU NEGATIVE 10/20/2024 2251   HGBUR SMALL (A) 10/20/2024 2251   BILIRUBINUR NEGATIVE 10/20/2024 2251   KETONESUR 5 (A) 10/20/2024 2251   PROTEINUR 30 (A) 10/20/2024 2251   NITRITE NEGATIVE 10/20/2024 2251   LEUKOCYTESUR MODERATE (A) 10/20/2024 2251   Sepsis Labs: @LABRCNTIP (procalcitonin:4,lacticidven:4) ) Recent Results (from the past 240 hours)  Culture, blood (Routine X 2) w Reflex to ID Panel     Status: None (Preliminary result)   Collection Time: 10/20/24  9:56 PM   Specimen: BLOOD LEFT ARM  Result Value Ref Range Status   Specimen Description BLOOD LEFT ARM  Final   Special Requests   Final    BOTTLES DRAWN AEROBIC AND  ANAEROBIC Blood Culture results may not be optimal due to an inadequate volume of blood received in culture bottles   Culture   Final    NO GROWTH 4 DAYS Performed at [facility], [address] [place], [place] [address]    Report Status PENDING  Incomplete  Urine Culture (for pregnant, neutropenic or urologic patients or patients with an indwelling urinary catheter)     Status: Abnormal   Collection Time: 10/20/24 10:51 PM   Specimen: Urine, Clean Catch  Result Value Ref Range Status   Specimen Description   Final    URINE, CLEAN CATCH Performed at [facility], [address] [place], [place] [address]    Special Requests   Final    NONE Performed at [facility], [address], [place], [place] [address]    Culture >=100,000 COLONIES/mL ESCHERICHIA COLI (A)  Final   Report Status 10/23/2024 FINAL  Final  Organism ID, Bacteria ESCHERICHIA COLI (A)  Final      Susceptibility   Escherichia coli - MIC*    AMPICILLIN 8 SENSITIVE Sensitive     CEFAZOLIN (URINE) Value in next row Sensitive      <=1 SENSITIVEThis is a modified FDA-approved test that has been validated and its performance characteristics determined by the reporting laboratory.  This laboratory is certified under the Clinical Laboratory Improvement Amendments CLIA as qualified to perform high complexity clinical laboratory testing.    CEFEPIME Value in next row Sensitive      <=1 SENSITIVEThis is a modified FDA-approved test that has been validated and its performance characteristics determined by the reporting laboratory.  This laboratory is certified under the Clinical Laboratory Improvement Amendments CLIA as qualified to perform high complexity clinical laboratory testing.    ERTAPENEM Value in next row Sensitive      <=1 SENSITIVEThis is a modified FDA-approved test that has been validated and its performance characteristics determined by the reporting laboratory.  This laboratory is certified under the Clinical  Laboratory Improvement Amendments CLIA as qualified to perform high complexity clinical laboratory testing.    CEFTRIAXONE  Value in next row Sensitive      <=1 SENSITIVEThis is a modified FDA-approved test that has been validated and its performance characteristics determined by the reporting laboratory.  This laboratory is certified under the Clinical Laboratory Improvement Amendments CLIA as qualified to perform high complexity clinical laboratory testing.    CIPROFLOXACIN Value in next row Sensitive      <=1 SENSITIVEThis is a modified FDA-approved test that has been validated and its performance characteristics determined by the reporting laboratory.  This laboratory is certified under the Clinical Laboratory Improvement Amendments CLIA as qualified to perform high complexity clinical laboratory testing.    GENTAMICIN Value in next row Sensitive      <=1 SENSITIVEThis is a modified FDA-approved test that has been validated and its performance characteristics determined by the reporting laboratory.  This laboratory is certified under the Clinical Laboratory Improvement Amendments CLIA as qualified to perform high complexity clinical laboratory testing.    NITROFURANTOIN  Value in next row Sensitive      <=1 SENSITIVEThis is a modified FDA-approved test that has been validated and its performance characteristics determined by the reporting laboratory.  This laboratory is certified under the Clinical Laboratory Improvement Amendments CLIA as qualified to perform high complexity clinical laboratory testing.    TRIMETH/SULFA Value in next row Sensitive      <=1 SENSITIVEThis is a modified FDA-approved test that has been validated and its performance characteristics determined by the reporting laboratory.  This laboratory is certified under the Clinical Laboratory Improvement Amendments CLIA as qualified to perform high complexity clinical laboratory testing.    AMPICILLIN/SULBACTAM Value in next row  Sensitive      <=1 SENSITIVEThis is a modified FDA-approved test that has been validated and its performance characteristics determined by the reporting laboratory.  This laboratory is certified under the Clinical Laboratory Improvement Amendments CLIA as qualified to perform high complexity clinical laboratory testing.    PIP/TAZO Value in next row Sensitive      <=4 SENSITIVEThis is a modified FDA-approved test that has been validated and its performance characteristics determined by the reporting laboratory.  This laboratory is certified under the Clinical Laboratory Improvement Amendments CLIA as qualified to perform high complexity clinical laboratory testing.    MEROPENEM Value in next row Sensitive      <=4 SENSITIVEThis is a modified  FDA-approved test that has been validated and its performance characteristics determined by the reporting laboratory.  This laboratory is certified under the Clinical Laboratory Improvement Amendments CLIA as qualified to perform high complexity clinical laboratory testing.    * >=100,000 COLONIES/mL ESCHERICHIA COLI  MRSA Next Gen by PCR, Nasal     Status: None   Collection Time: 10/21/24  5:18 AM   Specimen: Nasal Mucosa; Nasal Swab  Result Value Ref Range Status   MRSA by PCR Next Gen NOT DETECTED NOT DETECTED Final    Comment: (NOTE) The GeneXpert MRSA Assay (FDA approved for NASAL specimens only), is one component of a comprehensive MRSA colonization surveillance program. It is not intended to diagnose MRSA infection nor to guide or monitor treatment for MRSA infections. Test performance is not FDA approved in patients less than 81 years old. Performed at [facility], [address] [place], [place] [address]   Culture, blood (Routine X 2) w Reflex to ID Panel     Status: None (Preliminary result)   Collection Time: 10/21/24  5:35 AM   Specimen: BLOOD LEFT ARM  Result Value Ref Range Status   Specimen Description BLOOD LEFT ARM  Final   Special  Requests   Final    BOTTLES DRAWN AEROBIC AND ANAEROBIC Blood Culture adequate volume   Culture   Final    NO GROWTH 4 DAYS Performed at [facility], [address] [place], [place] [address]    Report Status PENDING  Incomplete     Scheduled Meds:  apixaban   5 mg Oral BID   cefadroxil  500 mg Oral BID   Chlorhexidine  Gluconate Cloth  6 each Topical Daily   diltiazem   90 mg Oral Q6H   ferrous sulfate   325 mg Oral Q M,W,F   folic acid   1 mg Oral Daily   insulin  aspart  0-5 Units Subcutaneous QHS   insulin  aspart  0-9 Units Subcutaneous TID WC   levothyroxine   75 mcg Oral Q48H   vitamin B-12  500 mcg Oral Daily   Continuous Infusions:  diltiazem  (CARDIZEM ) infusion 15 mg/hr (10/25/24 1304)    Procedures/Studies: CT ABDOMEN PELVIS W CONTRAST Result Date: 10/21/2024 EXAM: CT ABDOMEN AND PELVIS WITH CONTRAST 10/21/2024 12:12:35 AM TECHNIQUE: CT of the abdomen and pelvis was performed with the administration of 100 ml of intravenous contrast (iohexol  (OMNIPAQUE ) 350 MG/ML injection). Multiplanar reformatted images are provided for review. Automated exposure control, iterative reconstruction, and/or weight-based adjustment of the mA/kV was utilized to reduce the radiation dose to as low as reasonably achievable. COMPARISON: 05/16/2024 CLINICAL HISTORY: Increased lethargy and possible sepsis. FINDINGS: LIVER: The liver is within normal limits. GALLBLADDER AND BILE DUCTS: The gallbladder is within normal limits. No biliary ductal dilatation. SPLEEN: The spleen is unremarkable. PANCREAS: The pancreas is unremarkable. ADRENAL GLANDS: The adrenal glands are within normal limits. KIDNEYS, URETERS AND BLADDER: Kidneys demonstrated normal enhancement pattern bilaterally. No renal calculi were observed. The ureters are within normal limits. The bladder is decompressed and unremarkable. No hydronephrosis. GI AND BOWEL: Stomach and small bowel are unremarkable. No obstructive or inflammatory changes of  the colon are seen. The appendix is within normal limits. There is no bowel obstruction. PERITONEUM AND RETROPERITONEUM: No free fluid is seen. No free air. VASCULATURE: Aortic calcifications are seen without aneurysmal dilatation. LYMPH NODES: No lymphadenopathy. REPRODUCTIVE ORGANS: The uterus has been surgically removed. BONES AND SOFT TISSUES: No acute bony abnormality is seen. No focal soft tissue abnormality. IMPRESSION: 1. No acute findings  in the abdomen or pelvis. Electronically signed by: Oneil Devonshire MD 10/21/2024 12:23 AM EST RP Workstation: [id number]   CT Angio Chest PE W and/or Wo Contrast Result Date: 10/21/2024 EXAM: CTA of the Chest with contrast for PE 10/21/2024 12:12:35 AM TECHNIQUE: CTA of the chest was performed without and with the administration of 100 mL iohexol  (OMNIPAQUE ) 350 MG/ML injection. Multiplanar reformatted images are provided for review. MIP images are provided for review. Automated exposure control, iterative reconstruction, and/or weight based adjustment of the mA/kV was utilized to reduce the radiation dose to as low as reasonably achievable. COMPARISON: Chest x-ray from the previous day. CLINICAL HISTORY: Difficulty breathing. FINDINGS: PULMONARY ARTERIES: The pulmonary artery shows a normal branching pattern bilaterally. No filling defect to suggest pulmonary embolism is noted. Main pulmonary artery is normal in caliber. MEDIASTINUM: No cardiac enlargement is seen. Mild coronary calcifications are noted. The thoracic aorta shows atherosclerotic calcifications without aneurysmal dilatation or dissection. The esophagus is within normal limits. LYMPH NODES: No hilar or mediastinal adenopathy is noted. LUNGS AND PLEURA: Lungs are well aerated bilaterally. No focal infiltrate or effusion is seen. No pneumothorax. SOFT TISSUES AND BONES: No acute soft tissue abnormality. No bony abnormality is noted. IMPRESSION: 1. No pulmonary embolism. Electronically signed by: Oneil Devonshire  MD 10/21/2024 12:19 AM EST RP Workstation: [id number]   DG Chest Portable 1 View Result Date: 10/20/2024 EXAM: 1 VIEW(S) XRAY OF THE CHEST 10/20/2024 11:04:00 PM COMPARISON: Comparison with 02/29/2024. CLINICAL HISTORY: eval for pneumonia. eval for pneumonia FINDINGS: LUNGS AND PLEURA: Shallow inspiration. No focal pulmonary opacity. No pleural effusion. No pneumothorax. HEART AND MEDIASTINUM: Calcification of the aorta. BONES AND SOFT TISSUES: Degenerative changes in the spine and shoulders. IMPRESSION: 1. No focal airspace consolidation to suggest pneumonia. Evaluation limited by shallow inspiration. Electronically signed by: Elsie Gravely MD 10/20/2024 11:07 PM EST RP Workstation: [id number]    Alm Schneider, DO  [facility]  If 7PM-7AM, please contact night-coverage [email or web address] Password [email or web address] 10/25/2024, 1:12 PM   LOS: 4 days

## 2024-10-26 DIAGNOSIS — E872 Acidosis, unspecified: Secondary | ICD-10-CM | POA: Diagnosis not present

## 2024-10-26 DIAGNOSIS — I480 Paroxysmal atrial fibrillation: Secondary | ICD-10-CM | POA: Diagnosis not present

## 2024-10-26 DIAGNOSIS — E039 Hypothyroidism, unspecified: Secondary | ICD-10-CM | POA: Diagnosis not present

## 2024-10-26 DIAGNOSIS — N3090 Cystitis, unspecified without hematuria: Secondary | ICD-10-CM | POA: Diagnosis not present

## 2024-10-26 DIAGNOSIS — G9341 Metabolic encephalopathy: Secondary | ICD-10-CM | POA: Diagnosis not present

## 2024-10-26 LAB — GLUCOSE, CAPILLARY
Glucose-Capillary: 117 mg/dL — ABNORMAL HIGH (ref 70–99)
Glucose-Capillary: 127 mg/dL — ABNORMAL HIGH (ref 70–99)
Glucose-Capillary: 126 mg/dL — ABNORMAL HIGH (ref 70–99)
Glucose-Capillary: 128 mg/dL — ABNORMAL HIGH (ref 70–99)

## 2024-10-26 LAB — CULTURE, BLOOD (ROUTINE X 2)
Culture: NO GROWTH
Culture: NO GROWTH
Special Requests: ADEQUATE

## 2024-10-26 LAB — BASIC METABOLIC PANEL WITH GFR
Anion gap: 10 (ref 5–15)
BUN: 9 mg/dL (ref 8–23)
CO2: 23 mmol/L (ref 22–32)
Calcium: 8.3 mg/dL — ABNORMAL LOW (ref 8.9–10.3)
Chloride: 104 mmol/L (ref 98–111)
Creatinine, Ser: 0.74 mg/dL (ref 0.44–1.00)
GFR, Estimated: >60 mL/min (ref >60)
Glucose, Bld: 174 mg/dL — ABNORMAL HIGH (ref 70–99)
Potassium: 4.5 mmol/L (ref 3.5–5.1)
Sodium: 137 mmol/L (ref 135–145)

## 2024-10-26 LAB — HEMOGLOBIN A1C

## 2024-10-26 LAB — MAGNESIUM: Magnesium: 1.9 mg/dL (ref 1.7–2.4)

## 2024-10-26 MED ORDER — DILTIAZEM HCL 30 MG PO TABS
30.0000 mg | ORAL_TABLET | Freq: Four times a day (QID) | ORAL | Status: DC
  Administered 2024-10-26 – 2024-10-29 (×11): 30 mg via ORAL
  Filled 2024-10-26 (×11): qty 1

## 2024-10-26 MED ORDER — METOPROLOL TARTRATE 5 MG/5ML IV SOLN
2.5000 mg | Freq: Once | INTRAVENOUS | Status: AC
  Administered 2024-10-26: 2.5 mg via INTRAVENOUS
  Filled 2024-10-26: qty 5

## 2024-10-26 MED ORDER — AMIODARONE HCL IN DEXTROSE 360-4.14 MG/200ML-% IV SOLN
60.0000 mg/h | INTRAVENOUS | Status: AC
  Administered 2024-10-26 (×2): 60 mg/h via INTRAVENOUS
  Filled 2024-10-26 (×2): qty 200

## 2024-10-26 MED ORDER — AMIODARONE HCL IN DEXTROSE 360-4.14 MG/200ML-% IV SOLN
30.0000 mg/h | INTRAVENOUS | Status: DC
  Administered 2024-10-26 – 2024-10-27 (×2): 30 mg/h via INTRAVENOUS
  Filled 2024-10-26 (×3): qty 200

## 2024-10-26 MED ORDER — AMIODARONE LOAD VIA INFUSION
150.0000 mg | Freq: Once | INTRAVENOUS | Status: AC
  Administered 2024-10-26: 150 mg via INTRAVENOUS
  Filled 2024-10-26: qty 83.34

## 2024-10-26 MED ORDER — DILTIAZEM HCL 60 MG PO TABS: 60.0000 mg | ORAL_TABLET | Freq: Four times a day (QID) | ORAL | Status: DC

## 2024-10-26 NOTE — Consult Note (Signed)
 CARDIOLOGY CONSULTATION  Patient ID: Madison Davidson; [id number]; 09/26/1951   Admit date: 10/20/2024 Date of Consult: 10/26/2024  Primary Care Provider: System, Provider Not In Primary Cardiologist: Vishnu P Mallipeddi, MD  HISTORY OF PRESENT ILLNESS  Madison Davidson is a 73 y.o. female with a history of paroxysmal atrial fibrillation and CHA2DS2-VASc score of 7, CAD, type 2 diabetes mellitus, primary hypertension, and previous stroke now admitted from [facility] with metabolic encephalopathy in the setting of E. coli UTI.  She has also experienced paroxysmal atrial fibrillation and more recently atypical atrial flutter with RVR, cardiology consulted to assist with rhythm management.  Recently on IV Cardizem  with switch to oral regimen and otherwise on Eliquis  5 mg twice daily for stroke prophylaxis.  She is a limited historian.  Does not report any acute symptoms on my interview.  Eating lunch comfortably with heart rate in the 150s.  TSH 4.88.  ROS  Pertinent review in history of present illness.  Past Medical History:  Diagnosis Date   Cancer (HCC)    skin   Coronary artery disease    Diabetes mellitus without complication (HCC)    Hypertension    Stroke [facility])     Past Surgical History:  Procedure Laterality Date   ABDOMINAL HYSTERECTOMY     IR ANGIO INTRA EXTRACRAN SEL INTERNAL CAROTID BILAT MOD SED  05/11/2021   IR ANGIO VERTEBRAL SEL VERTEBRAL UNI R MOD SED  05/11/2021   IR ANGIOGRAM EXTREMITY LEFT  05/11/2021   IR CT HEAD LTD  05/11/2021   IR INTRA CRAN STENT  05/11/2021   IR INTRA CRAN STENT  05/12/2021   IR US  GUIDE VASC ACCESS RIGHT  05/11/2021   RADIOLOGY WITH ANESTHESIA N/A 05/11/2021   Procedure: IR WITH ANESTHESIA - INTRACRANIAL STENT;  Surgeon: de Macedo Rodrigues, Katy[place]ia, MD;  Location: [facility] OR;  Service: Radiology;  Laterality: N/A;     INPATIENT MEDICATIONS Scheduled Meds:  amiodarone   150 mg Intravenous Once   apixaban   5 mg Oral BID   Chlorhexidine   Gluconate Cloth  6 each Topical Daily   diltiazem   90 mg Oral Q6H   ferrous sulfate   325 mg Oral Q M,W,F   folic acid   1 mg Oral Daily   insulin  aspart  0-5 Units Subcutaneous QHS   insulin  aspart  0-9 Units Subcutaneous TID WC   levothyroxine   75 mcg Oral Q48H   magnesium  oxide  400 mg Oral Daily   metoprolol tartrate  2.5 mg Intravenous Once   vitamin B-12  500 mcg Oral Daily   Continuous Infusions:  amiodarone      Followed by   amiodarone      diltiazem  (CARDIZEM ) infusion Stopped (10/25/24 1653)   PRN Meds: acetaminophen , melatonin, phenol, polyethylene glycol, prochlorperazine   ALLERGIES Allergies  Allergen Reactions   Augmentin [Amoxicillin-Pot Clavulanate] Other (See Comments)    No reaction listed on MAR   Doxycycline Other (See Comments)    No reaction listed on MAR   Lisinopril  Other (See Comments)    No reaction listed on MAR   Norvasc [Amlodipine] Other (See Comments)    No reaction listed on MAR    SOCIAL HISTORY  Social History   Tobacco Use   Smoking status: Never   Smokeless tobacco: Never  Substance Use Topics   Alcohol use: No    FAMILY HISTORY   The patient's family history includes Heart disease in her father and mother.  PHYSICAL EXAM & DATA  Vitals:   10/26/24 1100 10/26/24  1130 10/26/24 1157 10/26/24 1200  BP: 126/63 97/61 119/71 112/64  Pulse: 64 72 (!) 164 (!) 163  Resp: 12 15 18 20   Temp:      TempSrc:      SpO2: 100% 97% 97% 98%  Weight:      Height:        Intake/Output Summary (Last 24 hours) at 10/26/2024 1216 Last data filed at 10/26/2024 0800 Gross per 24 hour  Intake 926.01 ml  Output 1300 ml  Net -373.99 ml   Filed Weights   10/20/24 2130 10/21/24 0532  Weight: 65.9 kg 61.4 kg   Body mass index is 24.76 kg/m.   Gen: Patient appears comfortable at rest. HEENT: Conjunctiva and lids normal, oropharynx clear. Neck: Supple, no elevated JVP or carotid bruits. Lungs: Clear to auscultation, nonlabored breathing at  rest. Cardiac: Rapid and largely regular rhythm, no obvious murmur or gallop. Abdomen: Soft, nontender, bowel sounds present. Extremities: No pitting edema, distal pulses 2+. Skin: Warm and dry. Musculoskeletal: No kyphosis. Neuropsychiatric: Alert and oriented x1.  EKG:  An ECG dated 10/23/2024 was personally reviewed today and demonstrated:  Atrial fibrillation with RVR, leftward axis, nonspecific ST-T changes.  Telemetry:  I personally reviewed telemetry which shows probable atypical atrial flutter with 2:1 block.  RELEVANT CV STUDIES  Echocardiogram 06/06/2022:  1. Left ventricular ejection fraction, by estimation, is 60 to 65%. The  left ventricle has normal function. The left ventricle has no regional  wall motion abnormalities. Left ventricular diastolic parameters were  normal.   2. Right ventricular systolic function is normal. The right ventricular  size is normal.   3. The mitral valve is normal in structure. Trivial mitral valve  regurgitation.   4. The aortic valve is normal in structure. Aortic valve regurgitation is  not visualized.   LABORATORY DATA  Chemistry Recent Labs  Lab 10/23/24 0543 10/24/24 0300 10/25/24 0412  NA 139 138 138  K 4.2 4.0 4.0  CL 108 106 106  CO2 23 24 24   GLUCOSE 116* 108* 120*  BUN 18 12 10   CREATININE 0.74 0.66 0.62  CALCIUM  7.8* 7.9* 8.0*  GFRNONAA >60 >60 >60  ANIONGAP 8 8 9     Recent Labs  Lab 10/20/24 2156 10/21/24 0921 10/22/24 0508  PROT 6.5 6.1* 5.5*  ALBUMIN 3.0* 2.7* 2.3*  AST 70* 112* 52*  ALT 30 43 30  ALKPHOS 68 59 60  BILITOT 0.4 <0.2 <0.2   Hematology Recent Labs  Lab 10/20/24 2156 10/22/24 0508  WBC 9.1 6.8  RBC 3.65* 3.26*  HGB 10.3* 9.1*  HCT 35.0* 29.9*  MCV 95.9 91.7  MCH 28.2 27.9  MCHC 29.4* 30.4  RDW 14.2 14.3  PLT 438* 309    RADIOLOGY/STUDIES No results found.  ASSESSMENT & PLAN  1.  Paroxysmal atrial fibrillation and atypical atrial flutter with RVR.  CHA2DS2-VASc score is  7.  Recently transitioned from IV Cardizem  to oral dosing and heart rate uncontrolled in atypical atrial flutter.  Current regimen includes Cardizem  90 mg every 6 hours and Eliquis  5 mg twice daily.  2.  Metabolic encephalopathy in the setting of E. coli UTI.  3.  Hypothyroidism on Synthroid .  Plan to initiate IV amiodarone  load and infusion to assist with heart rate control and hopefully aid in conversion to sinus rhythm.  May need to down titrate Cardizem  depending on heart rate response.  Continue Eliquis  5 mg twice daily for stroke prophylaxis.  For questions or updates, please contact [facility] Please consult [email or web address] for contact info under   Signed, Jayson Sierras, MD  10/26/2024 12:16 PM

## 2024-10-26 NOTE — Progress Notes (Signed)
 PROGRESS NOTE  Madison Davidson FMW:[id number] DOB: [birth year] DOA: 10/20/2024 PCP: System, Provider Not In  Brief History:  73 year old female with a history of hypertension, right MCA stroke s/p stent 2022, insomnia, diabetes mellitus type 2, hypothyroidism, depression, paroxysmal atrial fibrillation presenting with lethargy.  [facility].  Patient is difficult historian.  Hx supplemented by daughter. She denies any chest pain, shortness breath, abdominal pain, nausea, vomiting, diarrhea. In the ED, the patient was afebrile and hemodynamically stable with oxygen saturation 100% room air.  WBC 9.1, hemoglobin 10.3, platelets 438.  Sodium 146, potassium 4.9, bicarbonate 11, serum creatinine 1.18.  Per EMS, the patient was noted to be hypotensive with glucose in the 50s. Lactic acid >9.0>>7.0.  TSH 4.860.  CTA chest negative for PE or infiltrates.  CT abdomen and pelvis negative for acute findings per chest x-ray is negative for infiltrates.  UA>50 WBC.  Patient was started fluids and ceftriaxone . Her mental status improved and returned to baseline.  Her hospitalization was prolonged due to development of afib with RVR.  She was started on IV diltiazem  with gradual improvement of her HR as her metabolic abnormalities improved.  Her IV diltiazem  was converted to po which she tolerated. In the morning of 10/26/2024, the patient developed RVR again.  She was started on IV amiodarone .   Assessment/Plan: Lethargy/acute metabolic encephalopathy - Secondary to dehydration and UTI - blood cultures neg to date - Continue ceftriaxone  2 g daily>>cefadroxil - Continue IV fluids - B12--331 - folate 5.7>>>replete - Check ammonia--23 - VBG--7.36/42/<31/38 - TSH 4.860 -11/28--back to baseline   UTI--E coli -UA>50 WBC - initially ceftriaxone  pending culture data - 11/28--transition to po cefadroxil - finished 7 days abx   Paroxysmal atrial fibrillation with RVR -  11/28 early am--developed afib with RVR - transitioned to po diltiazem  11/30 - 12/1--developed RVR again>>started IV amio - Continue apixaban  - optimize K and Mag   Lactic acidosis - VBG--7.36/42/<31/38 - lactate >9, now 1.9 - blood cultures neg x 2 - in part due to metformin  - Continue IV fluids>>improved   Hypernatremia - Secondary to volume depletion -continue IVF>>improved -follow BMP   Hypothyroidism - Continue levothyroxine    Well-controlled diabetes mellitus type 2 - 01/26/2022 hemoglobin A1c 5.9 - continue novolog  sliding scale - 11/29 A1C--5.5   Mixed hyperlipidemia - Hold statin temporarily due to elevated LFT   Major neurocognitive disorder - Daughter states that the patient has had a cognitive and functional decline over the past 6 months   Hypomagnesemia -repleted    Family Communication:   daughter 11/30   Consultants:  none   Code Status:  DNR   DVT Prophylaxis:  apixaban      Procedures: As Listed in Progress Note Above   Antibiotics: Ceftriaxone  11/25>>11/28 Cefadroxil 11/28>>12/1              Subjective: Patient denies fevers, chills, headache, chest pain, dyspnea, nausea, vomiting, diarrhea, abdominal pain, dysuria, hematuria, hematochezia, and melena.   Objective: Vitals:   10/26/24 1620 10/26/24 1625 10/26/24 1630 10/26/24 1700  BP:      Pulse: 60 69 76   Resp: 14 16 14    Temp:    97.8 F (36.6 C)  TempSrc:    Oral  SpO2: 99% 99% 100%   Weight:      Height:        Intake/Output Summary (Last 24 hours) at 10/26/2024 1733 Last data filed at 10/26/2024  1500 Gross per 24 hour  Intake 800 ml  Output 1525 ml  Net -725 ml   Weight change:  Exam:  General:  Pt is alert, follows commands appropriately, not in acute distress HEENT: No icterus, No thrush, No neck mass, [place]/AT Cardiovascular: IRRR, S1/S2, no rubs, no gallops Respiratory: CTA bilaterally, no wheezing, no crackles, no rhonchi Abdomen: Soft/+BS, non  tender, non distended, no guarding Extremities: No edema, No lymphangitis, No petechiae, No rashes, no synovitis   Data Reviewed: I have personally reviewed following labs and imaging studies Basic Metabolic Panel: Recent Labs  Lab 10/22/24 0508 10/23/24 0543 10/24/24 0300 10/25/24 0412 10/26/24 1325  NA 141 139 138 138 137  K 4.1 4.2 4.0 4.0 4.5  CL 112* 108 106 106 104  CO2 18* 23 24 24 23   GLUCOSE 93 116* 108* 120* 174*  BUN 29* 18 12 10 9   CREATININE 0.92 0.74 0.66 0.62 0.74  CALCIUM  7.8* 7.8* 7.9* 8.0* 8.3*  MG 2.4 1.6* 2.7* 2.2 1.9   Liver Function Tests: Recent Labs  Lab 10/20/24 2156 10/21/24 0921 10/22/24 0508  AST 70* 112* 52*  ALT 30 43 30  ALKPHOS 68 59 60  BILITOT 0.4 <0.2 <0.2  PROT 6.5 6.1* 5.5*  ALBUMIN 3.0* 2.7* 2.3*   No results for input(s): LIPASE, AMYLASE in the last 168 hours. Recent Labs  Lab 10/21/24 0921  AMMONIA 23   Coagulation Profile: No results for input(s): INR, PROTIME in the last 168 hours. CBC: Recent Labs  Lab 10/20/24 2156 10/22/24 0508  WBC 9.1 6.8  HGB 10.3* 9.1*  HCT 35.0* 29.9*  MCV 95.9 91.7  PLT 438* 309   Cardiac Enzymes: Recent Labs  Lab 10/21/24 0921  CKTOTAL 227   BNP: Invalid input(s): POCBNP CBG: Recent Labs  Lab 10/25/24 1628 10/25/24 2057 10/26/24 0748 10/26/24 1207 10/26/24 1651  GLUCAP 138* 163* 117* 127* 126*   HbA1C: Recent Labs    10/24/24 0300  HGBA1C 5.5   Urine analysis:    Component Value Date/Time   COLORURINE YELLOW 10/20/2024 2251   APPEARANCEUR HAZY (A) 10/20/2024 2251   LABSPEC 1.027 10/20/2024 2251   PHURINE 5.0 10/20/2024 2251   GLUCOSEU NEGATIVE 10/20/2024 2251   HGBUR SMALL (A) 10/20/2024 2251   BILIRUBINUR NEGATIVE 10/20/2024 2251   KETONESUR 5 (A) 10/20/2024 2251   PROTEINUR 30 (A) 10/20/2024 2251   NITRITE NEGATIVE 10/20/2024 2251   LEUKOCYTESUR MODERATE (A) 10/20/2024 2251   Sepsis Labs: @LABRCNTIP (procalcitonin:4,lacticidven:4) ) Recent  Results (from the past 240 hours)  Culture, blood (Routine X 2) w Reflex to ID Panel     Status: None   Collection Time: 10/20/24  9:56 PM   Specimen: BLOOD LEFT ARM  Result Value Ref Range Status   Specimen Description BLOOD LEFT ARM  Final   Special Requests   Final    BOTTLES DRAWN AEROBIC AND ANAEROBIC Blood Culture results may not be optimal due to an inadequate volume of blood received in culture bottles   Culture   Final    NO GROWTH 5 DAYS Performed at [facility], [address], [place], [place] [address]    Report Status 10/26/2024 FINAL  Final  Urine Culture (for pregnant, neutropenic or urologic patients or patients with an indwelling urinary catheter)     Status: Abnormal   Collection Time: 10/20/24 10:51 PM   Specimen: Urine, Clean Catch  Result Value Ref Range Status   Specimen Description   Final    URINE, CLEAN CATCH  Performed at [facility], [address], [place], [place] [address]    Special Requests   Final    NONE Performed at [facility], [address], [place], [place] [address]    Culture >=100,000 COLONIES/mL ESCHERICHIA COLI (A)  Final   Report Status 10/23/2024 FINAL  Final   Organism ID, Bacteria ESCHERICHIA COLI (A)  Final      Susceptibility   Escherichia coli - MIC*    AMPICILLIN 8 SENSITIVE Sensitive     CEFAZOLIN (URINE) Value in next row Sensitive      <=1 SENSITIVEThis is a modified FDA-approved test that has been validated and its performance characteristics determined by the reporting laboratory.  This laboratory is certified under the Clinical Laboratory Improvement Amendments CLIA as qualified to perform high complexity clinical laboratory testing.    CEFEPIME Value in next row Sensitive      <=1 SENSITIVEThis is a modified FDA-approved test that has been validated and its performance characteristics determined by the reporting laboratory.  This laboratory is certified under the Clinical Laboratory Improvement Amendments CLIA as qualified  to perform high complexity clinical laboratory testing.    ERTAPENEM Value in next row Sensitive      <=1 SENSITIVEThis is a modified FDA-approved test that has been validated and its performance characteristics determined by the reporting laboratory.  This laboratory is certified under the Clinical Laboratory Improvement Amendments CLIA as qualified to perform high complexity clinical laboratory testing.    CEFTRIAXONE  Value in next row Sensitive      <=1 SENSITIVEThis is a modified FDA-approved test that has been validated and its performance characteristics determined by the reporting laboratory.  This laboratory is certified under the Clinical Laboratory Improvement Amendments CLIA as qualified to perform high complexity clinical laboratory testing.    CIPROFLOXACIN Value in next row Sensitive      <=1 SENSITIVEThis is a modified FDA-approved test that has been validated and its performance characteristics determined by the reporting laboratory.  This laboratory is certified under the Clinical Laboratory Improvement Amendments CLIA as qualified to perform high complexity clinical laboratory testing.    GENTAMICIN Value in next row Sensitive      <=1 SENSITIVEThis is a modified FDA-approved test that has been validated and its performance characteristics determined by the reporting laboratory.  This laboratory is certified under the Clinical Laboratory Improvement Amendments CLIA as qualified to perform high complexity clinical laboratory testing.    NITROFURANTOIN  Value in next row Sensitive      <=1 SENSITIVEThis is a modified FDA-approved test that has been validated and its performance characteristics determined by the reporting laboratory.  This laboratory is certified under the Clinical Laboratory Improvement Amendments CLIA as qualified to perform high complexity clinical laboratory testing.    TRIMETH/SULFA Value in next row Sensitive      <=1 SENSITIVEThis is a modified FDA-approved test that  has been validated and its performance characteristics determined by the reporting laboratory.  This laboratory is certified under the Clinical Laboratory Improvement Amendments CLIA as qualified to perform high complexity clinical laboratory testing.    AMPICILLIN/SULBACTAM Value in next row Sensitive      <=1 SENSITIVEThis is a modified FDA-approved test that has been validated and its performance characteristics determined by the reporting laboratory.  This laboratory is certified under the Clinical Laboratory Improvement Amendments CLIA as qualified to perform high complexity clinical laboratory testing.    PIP/TAZO Value in next row Sensitive      <=4 SENSITIVEThis is a modified  FDA-approved test that has been validated and its performance characteristics determined by the reporting laboratory.  This laboratory is certified under the Clinical Laboratory Improvement Amendments CLIA as qualified to perform high complexity clinical laboratory testing.    MEROPENEM Value in next row Sensitive      <=4 SENSITIVEThis is a modified FDA-approved test that has been validated and its performance characteristics determined by the reporting laboratory.  This laboratory is certified under the Clinical Laboratory Improvement Amendments CLIA as qualified to perform high complexity clinical laboratory testing.    * >=100,000 COLONIES/mL ESCHERICHIA COLI  MRSA Next Gen by PCR, Nasal     Status: None   Collection Time: 10/21/24  5:18 AM   Specimen: Nasal Mucosa; Nasal Swab  Result Value Ref Range Status   MRSA by PCR Next Gen NOT DETECTED NOT DETECTED Final    Comment: (NOTE) The GeneXpert MRSA Assay (FDA approved for NASAL specimens only), is one component of a comprehensive MRSA colonization surveillance program. It is not intended to diagnose MRSA infection nor to guide or monitor treatment for MRSA infections. Test performance is not FDA approved in patients less than 19 years old. Performed at [facility], [address], [place], [place] [address]   Culture, blood (Routine X 2) w Reflex to ID Panel     Status: None   Collection Time: 10/21/24  5:35 AM   Specimen: BLOOD LEFT ARM  Result Value Ref Range Status   Specimen Description BLOOD LEFT ARM  Final   Special Requests   Final    BOTTLES DRAWN AEROBIC AND ANAEROBIC Blood Culture adequate volume   Culture   Final    NO GROWTH 5 DAYS Performed at [facility], [address] [place], [place] [address]    Report Status 10/26/2024 FINAL  Final     Scheduled Meds:  apixaban   5 mg Oral BID   Chlorhexidine  Gluconate Cloth  6 each Topical Daily   diltiazem   30 mg Oral Q6H   ferrous sulfate   325 mg Oral Q M,W,F   folic acid   1 mg Oral Daily   insulin  aspart  0-5 Units Subcutaneous QHS   insulin  aspart  0-9 Units Subcutaneous TID WC   levothyroxine   75 mcg Oral Q48H   magnesium  oxide  400 mg Oral Daily   vitamin B-12  500 mcg Oral Daily   Continuous Infusions:  amiodarone  60 mg/hr (10/26/24 1503)   Followed by   amiodarone       Procedures/Studies: CT ABDOMEN PELVIS W CONTRAST Result Date: 10/21/2024 EXAM: CT ABDOMEN AND PELVIS WITH CONTRAST 10/21/2024 12:12:35 AM TECHNIQUE: CT of the abdomen and pelvis was performed with the administration of 100 ml of intravenous contrast (iohexol  (OMNIPAQUE ) 350 MG/ML injection). Multiplanar reformatted images are provided for review. Automated exposure control, iterative reconstruction, and/or weight-based adjustment of the mA/kV was utilized to reduce the radiation dose to as low as reasonably achievable. COMPARISON: 05/16/2024 CLINICAL HISTORY: Increased lethargy and possible sepsis. FINDINGS: LIVER: The liver is within normal limits. GALLBLADDER AND BILE DUCTS: The gallbladder is within normal limits. No biliary ductal dilatation. SPLEEN: The spleen is unremarkable. PANCREAS: The pancreas is unremarkable. ADRENAL GLANDS: The adrenal glands are within normal limits. KIDNEYS, URETERS AND BLADDER:  Kidneys demonstrated normal enhancement pattern bilaterally. No renal calculi were observed. The ureters are within normal limits. The bladder is decompressed and unremarkable. No hydronephrosis. GI AND BOWEL: Stomach and small bowel are unremarkable. No obstructive or inflammatory changes of the colon are seen.  The appendix is within normal limits. There is no bowel obstruction. PERITONEUM AND RETROPERITONEUM: No free fluid is seen. No free air. VASCULATURE: Aortic calcifications are seen without aneurysmal dilatation. LYMPH NODES: No lymphadenopathy. REPRODUCTIVE ORGANS: The uterus has been surgically removed. BONES AND SOFT TISSUES: No acute bony abnormality is seen. No focal soft tissue abnormality. IMPRESSION: 1. No acute findings in the abdomen or pelvis. Electronically signed by: Oneil Devonshire MD 10/21/2024 12:23 AM EST RP Workstation: [id number]   CT Angio Chest PE W and/or Wo Contrast Result Date: 10/21/2024 EXAM: CTA of the Chest with contrast for PE 10/21/2024 12:12:35 AM TECHNIQUE: CTA of the chest was performed without and with the administration of 100 mL iohexol  (OMNIPAQUE ) 350 MG/ML injection. Multiplanar reformatted images are provided for review. MIP images are provided for review. Automated exposure control, iterative reconstruction, and/or weight based adjustment of the mA/kV was utilized to reduce the radiation dose to as low as reasonably achievable. COMPARISON: Chest x-ray from the previous day. CLINICAL HISTORY: Difficulty breathing. FINDINGS: PULMONARY ARTERIES: The pulmonary artery shows a normal branching pattern bilaterally. No filling defect to suggest pulmonary embolism is noted. Main pulmonary artery is normal in caliber. MEDIASTINUM: No cardiac enlargement is seen. Mild coronary calcifications are noted. The thoracic aorta shows atherosclerotic calcifications without aneurysmal dilatation or dissection. The esophagus is within normal limits. LYMPH NODES: No hilar or mediastinal  adenopathy is noted. LUNGS AND PLEURA: Lungs are well aerated bilaterally. No focal infiltrate or effusion is seen. No pneumothorax. SOFT TISSUES AND BONES: No acute soft tissue abnormality. No bony abnormality is noted. IMPRESSION: 1. No pulmonary embolism. Electronically signed by: Oneil Devonshire MD 10/21/2024 12:19 AM EST RP Workstation: [id number]   DG Chest Portable 1 View Result Date: 10/20/2024 EXAM: 1 VIEW(S) XRAY OF THE CHEST 10/20/2024 11:04:00 PM COMPARISON: Comparison with 02/29/2024. CLINICAL HISTORY: eval for pneumonia. eval for pneumonia FINDINGS: LUNGS AND PLEURA: Shallow inspiration. No focal pulmonary opacity. No pleural effusion. No pneumothorax. HEART AND MEDIASTINUM: Calcification of the aorta. BONES AND SOFT TISSUES: Degenerative changes in the spine and shoulders. IMPRESSION: 1. No focal airspace consolidation to suggest pneumonia. Evaluation limited by shallow inspiration. Electronically signed by: Elsie Gravely MD 10/20/2024 11:07 PM EST RP Workstation: [id number]    Alm Schneider, DO  [facility]  If 7PM-7AM, please contact night-coverage [email or web address] Password [email or web address] 10/26/2024, 5:33 PM   LOS: 5 days

## 2024-10-27 DIAGNOSIS — E872 Acidosis, unspecified: Secondary | ICD-10-CM | POA: Diagnosis not present

## 2024-10-27 DIAGNOSIS — R5383 Other fatigue: Secondary | ICD-10-CM | POA: Diagnosis not present

## 2024-10-27 DIAGNOSIS — G9341 Metabolic encephalopathy: Secondary | ICD-10-CM | POA: Diagnosis not present

## 2024-10-27 DIAGNOSIS — E039 Hypothyroidism, unspecified: Secondary | ICD-10-CM | POA: Diagnosis not present

## 2024-10-27 DIAGNOSIS — I480 Paroxysmal atrial fibrillation: Secondary | ICD-10-CM | POA: Diagnosis not present

## 2024-10-27 LAB — BASIC METABOLIC PANEL WITH GFR
Anion gap: 9 (ref 5–15)
BUN: 8 mg/dL (ref 8–23)
CO2: 25 mmol/L (ref 22–32)
Calcium: 8.3 mg/dL — ABNORMAL LOW (ref 8.9–10.3)
Chloride: 106 mmol/L (ref 98–111)
Creatinine, Ser: 0.72 mg/dL (ref 0.44–1.00)
GFR, Estimated: >60 mL/min (ref >60)
Glucose, Bld: 106 mg/dL — ABNORMAL HIGH (ref 70–99)
Potassium: 4 mmol/L (ref 3.5–5.1)
Sodium: 141 mmol/L (ref 135–145)

## 2024-10-27 LAB — GLUCOSE, CAPILLARY
Glucose-Capillary: 111 mg/dL — ABNORMAL HIGH (ref 70–99)
Glucose-Capillary: 178 mg/dL — ABNORMAL HIGH (ref 70–99)
Glucose-Capillary: 84 mg/dL (ref 70–99)
Glucose-Capillary: 136 mg/dL — ABNORMAL HIGH (ref 70–99)

## 2024-10-27 LAB — MAGNESIUM: Magnesium: 2 mg/dL (ref 1.7–2.4)

## 2024-10-27 MED ORDER — DILTIAZEM HCL 25 MG/5ML IV SOLN
5.0000 mg | Freq: Once | INTRAVENOUS | Status: AC
  Administered 2024-10-27: 5 mg via INTRAVENOUS
  Filled 2024-10-27: qty 5

## 2024-10-27 NOTE — Progress Notes (Signed)
 Progress Note  Patient Name: Madison Davidson Date of Encounter: 10/27/2024  Primary Cardiologist: Vishnu P Mallipeddi, MD  Interval Summary  Chart reviewed.  Patient in atrial fibrillation this morning, overall heart rate controlled better on IV amiodarone , she is also on divided dose short acting Cardizem .  She does not complain of any chest pain or palpitations.  A and O x 2.  Vital Signs  Vitals:   10/27/24 0300 10/27/24 0400 10/27/24 0500 10/27/24 0600  BP: (!) 104/57 112/64 99/61 97/67   Pulse: 90 (!) 54 86   Resp: 14 15 16 15   Temp:  97.7 F (36.5 C)    TempSrc:  Axillary    SpO2: 95% 94%  95%  Weight:      Height:        Intake/Output Summary (Last 24 hours) at 10/27/2024 0814 Last data filed at 10/27/2024 0532 Gross per 24 hour  Intake 944.96 ml  Output 2075 ml  Net -1130.04 ml   Filed Weights   10/20/24 2130 10/21/24 0532  Weight: 65.9 kg 61.4 kg    Physical Exam  GEN: No acute distress.   Neck: No JVD. Cardiac: Irregularly irregular, no significant murmur or gallop.  Respiratory: Nonlabored. Clear to auscultation bilaterally. GI: Soft, nontender, bowel sounds present. MS: No edema. Neuro:  Nonfocal. Psych: Alert and oriented x 2. Normal affect.  ECG/Telemetry  Telemetry reviewed showing atrial fibrillation.  Labs  Chemistry Recent Labs  Lab 10/20/24 2156 10/21/24 0921 10/22/24 0508 10/23/24 0543 10/25/24 0412 10/26/24 1325 10/27/24 0536  NA 146* 142 141   < > 138 137 141  K 4.9 5.2* 4.1   < > 4.0 4.5 4.0  CL 108 109 112*   < > 106 104 106  CO2 11* 23 18*   < > 24 23 25   GLUCOSE 110* 95 93   < > 120* 174* 106*  BUN 37* 33* 29*   < > 10 9 8   CREATININE 1.18* 0.95 0.92   < > 0.62 0.74 0.72  CALCIUM  8.8* 8.1* 7.8*   < > 8.0* 8.3* 8.3*  PROT 6.5 6.1* 5.5*  --   --   --   --   ALBUMIN 3.0* 2.7* 2.3*  --   --   --   --   AST 70* 112* 52*  --   --   --   --   ALT 30 43 30  --   --   --   --   ALKPHOS 68 59 60  --   --   --   --    BILITOT 0.4 <0.2 <0.2  --   --   --   --   GFRNONAA 48* >60 >60   < > >60 >60 >60  ANIONGAP 27* 10 11   < > 9 10 9    < > = values in this interval not displayed.    Hematology Recent Labs  Lab 10/20/24 2156 10/22/24 0508  WBC 9.1 6.8  RBC 3.65* 3.26*  HGB 10.3* 9.1*  HCT 35.0* 29.9*  MCV 95.9 91.7  MCH 28.2 27.9  MCHC 29.4* 30.4  RDW 14.2 14.3  PLT 438* 309    Assessment & Plan  1.  Paroxysmal to persistent atrial fibrillation and atypical atrial flutter with RVR.  CHA2DS2-VASc score is 7.  Heart rate control somewhat better on IV amiodarone , she is also on Cardizem  30 mg p.o. every 6 hours and Eliquis  5 mg twice daily.   2.  Metabolic encephalopathy in the setting of E. coli UTI.   3.  Hypothyroidism on Synthroid .  TSH 4.86 and free T4 0.84.  Discussed with nursing.  Would plan to continue IV amiodarone  for now, no change in Cardizem  30 mg p.o. every 6 hours dosing based on recent blood pressure.  Overall heart rate control better, hopefully she will convert to sinus rhythm after more amiodarone  loading.  If not, cardioversion can be considered.  Continue Eliquis  5 mg twice daily.  For questions or updates, please contact [facility] Please consult [email or web address] for contact info under   Signed, Jayson Sierras, MD  10/27/2024, 8:14 AM

## 2024-10-27 NOTE — Plan of Care (Signed)

## 2024-10-27 NOTE — Progress Notes (Addendum)
 PROGRESS NOTE  Madison Davidson FMW:[id number] DOB: Feb 01, 1951 DOA: 10/20/2024 PCP: System, Provider Not In  Brief History:  73 year old female with a history of hypertension, right MCA stroke s/p stent 2022, insomnia, diabetes mellitus type 2, hypothyroidism, depression, paroxysmal atrial fibrillation presenting with lethargy.  [facility]  Patient is difficult historian.  Hx supplemented by daughter. She denies any chest pain, shortness breath, abdominal pain, nausea, vomiting, diarrhea. In the ED, the patient was afebrile and hemodynamically stable with oxygen saturation 100% room air.  WBC 9.1, hemoglobin 10.3, platelets 438.  Sodium 146, potassium 4.9, bicarbonate 11, serum creatinine 1.18.  Per EMS, the patient was noted to be hypotensive with glucose in the 50s. Lactic acid >9.0>>7.0.  TSH 4.860.  CTA chest negative for PE or infiltrates.  CT abdomen and pelvis negative for acute findings per chest x-ray is negative for infiltrates.  UA>50 WBC.  Patient was started fluids and ceftriaxone . Her mental status improved and returned to baseline.  Her hospitalization was prolonged due to development of afib with RVR.  She was started on IV diltiazem  with gradual improvement of her HR as her metabolic abnormalities improved.  Her IV diltiazem  was converted to po which she tolerated. In the morning of 10/26/2024, the patient developed RVR again.  She was started on IV amiodarone .  Hospitalization has been prolonged secondary to poorly controlled atrial fibrillation.  Cardiology was consulted to assist with management.   Assessment/Plan:  Lethargy/acute metabolic encephalopathy - Secondary to dehydration and UTI - blood cultures neg to date - Continue ceftriaxone  2 g daily>>cefadroxil - initially received IV fluids - B12--331 - folate 5.7>>>replete - Check ammonia--23 - VBG--7.36/42/<31/38 - TSH 4.860 -11/28--back to baseline   UTI--E coli -UA>50 WBC -  initially ceftriaxone  pending culture data - 11/28--transition to po cefadroxil - finished 7 days abx   Paroxysmal atrial fibrillation with RVR - 11/28 early am--developed afib with RVR - transitioned to po diltiazem  11/30>>continue - 12/1--developed RVR again>>started IV amio - Continue apixaban  - optimize K and Mag - appreciate cardiology>>may need DCCV   Lactic acidosis - VBG--7.36/42/<31/38 - lactate >9, now 1.9 - blood cultures neg x 2 - in part due to metformin  - Continue IV fluids>>improved   Hypernatremia - Secondary to volume depletion -continue IVF>>improved   Hypothyroidism - Continue levothyroxine    Well-controlled diabetes mellitus type 2 - 01/26/2022 hemoglobin A1c 5.9 - continue novolog  sliding scale - 11/29 A1C--5.5   Mixed hyperlipidemia - Hold statin temporarily due to elevated LFT   Major neurocognitive disorder - Daughter states that the patient has had a cognitive and functional decline over the past 6 months   Hypomagnesemia -repleted       Family Communication:   daughter 12/1   Consultants:  none   Code Status:  DNR   DVT Prophylaxis:  apixaban      Procedures: As Listed in Progress Note Above   Antibiotics: Ceftriaxone  11/25>>11/28 Cefadroxil 11/28>>12/1           Subjective: Patient denies fevers, chills, headache, chest pain, dyspnea, nausea, vomiting, diarrhea, abdominal pain, dysuria, hematuria, hematochezia, and melena.  Objective: Vitals:   10/27/24 1300 10/27/24 1400 10/27/24 1500 10/27/24 1600  BP: 118/67 121/65  124/63  Pulse: (!) 178 (!) 34 94 79  Resp: 16 15 16 14   Temp:    98.1 F (36.7 C)  TempSrc:    Oral  SpO2: 93% 98% 99% 100%  Weight:  Height:        Intake/Output Summary (Last 24 hours) at 10/27/2024 1825 Last data filed at 10/27/2024 1600 Gross per 24 hour  Intake 879.47 ml  Output 1800 ml  Net -920.53 ml   Weight change:  Exam:  General:  Pt is alert, follows commands  appropriately, not in acute distress HEENT: No icterus, No thrush, No neck mass, [place]/AT Cardiovascular: IRRR, S1/S2, no rubs, no gallops Respiratory: CTA bilaterally, no wheezing, no crackles, no rhonchi Abdomen: Soft/+BS, non tender, non distended, no guarding Extremities: No edema, No lymphangitis, No petechiae, No rashes, no synovitis   Data Reviewed: I have personally reviewed following labs and imaging studies Basic Metabolic Panel: Recent Labs  Lab 10/23/24 0543 10/24/24 0300 10/25/24 0412 10/26/24 1325 10/27/24 0536  NA 139 138 138 137 141  K 4.2 4.0 4.0 4.5 4.0  CL 108 106 106 104 106  CO2 23 24 24 23 25   GLUCOSE 116* 108* 120* 174* 106*  BUN 18 12 10 9 8   CREATININE 0.74 0.66 0.62 0.74 0.72  CALCIUM  7.8* 7.9* 8.0* 8.3* 8.3*  MG 1.6* 2.7* 2.2 1.9 2.0   Liver Function Tests: Recent Labs  Lab 10/20/24 2156 10/21/24 0921 10/22/24 0508  AST 70* 112* 52*  ALT 30 43 30  ALKPHOS 68 59 60  BILITOT 0.4 <0.2 <0.2  PROT 6.5 6.1* 5.5*  ALBUMIN 3.0* 2.7* 2.3*   No results for input(s): LIPASE, AMYLASE in the last 168 hours. Recent Labs  Lab 10/21/24 0921  AMMONIA 23   Coagulation Profile: No results for input(s): INR, PROTIME in the last 168 hours. CBC: Recent Labs  Lab 10/20/24 2156 10/22/24 0508  WBC 9.1 6.8  HGB 10.3* 9.1*  HCT 35.0* 29.9*  MCV 95.9 91.7  PLT 438* 309   Cardiac Enzymes: Recent Labs  Lab 10/21/24 0921  CKTOTAL 227   BNP: Invalid input(s): POCBNP CBG: Recent Labs  Lab 10/26/24 1651 10/26/24 2114 10/27/24 0808 10/27/24 1215 10/27/24 1603  GLUCAP 126* 128* 111* 178* 84   HbA1C: No results for input(s): HGBA1C in the last 72 hours. Urine analysis:    Component Value Date/Time   COLORURINE YELLOW 10/20/2024 2251   APPEARANCEUR HAZY (A) 10/20/2024 2251   LABSPEC 1.027 10/20/2024 2251   PHURINE 5.0 10/20/2024 2251   GLUCOSEU NEGATIVE 10/20/2024 2251   HGBUR SMALL (A) 10/20/2024 2251   BILIRUBINUR NEGATIVE  10/20/2024 2251   KETONESUR 5 (A) 10/20/2024 2251   PROTEINUR 30 (A) 10/20/2024 2251   NITRITE NEGATIVE 10/20/2024 2251   LEUKOCYTESUR MODERATE (A) 10/20/2024 2251   Sepsis Labs: @LABRCNTIP (procalcitonin:4,lacticidven:4) ) Recent Results (from the past 240 hours)  Culture, blood (Routine X 2) w Reflex to ID Panel     Status: None   Collection Time: 10/20/24  9:56 PM   Specimen: BLOOD LEFT ARM  Result Value Ref Range Status   Specimen Description BLOOD LEFT ARM  Final   Special Requests   Final    BOTTLES DRAWN AEROBIC AND ANAEROBIC Blood Culture results may not be optimal due to an inadequate volume of blood received in culture bottles   Culture   Final    NO GROWTH 5 DAYS Performed at [facility], [address], [place], [place] [address]    Report Status 10/26/2024 FINAL  Final  Urine Culture (for pregnant, neutropenic or urologic patients or patients with an indwelling urinary catheter)     Status: Abnormal   Collection Time: 10/20/24 10:51 PM   Specimen: Urine, Clean Catch  Result Value Ref Range Status   Specimen Description   Final    URINE, CLEAN CATCH Performed at [facility], [address], [place], [place] [address]    Special Requests   Final    NONE Performed at [facility], [address], [place], [place] [address]    Culture >=100,000 COLONIES/mL ESCHERICHIA COLI (A)  Final   Report Status 10/23/2024 FINAL  Final   Organism ID, Bacteria ESCHERICHIA COLI (A)  Final      Susceptibility   Escherichia coli - MIC*    AMPICILLIN 8 SENSITIVE Sensitive     CEFAZOLIN (URINE) Value in next row Sensitive      <=1 SENSITIVEThis is a modified FDA-approved test that has been validated and its performance characteristics determined by the reporting laboratory.  This laboratory is certified under the Clinical Laboratory Improvement Amendments CLIA as qualified to perform high complexity clinical laboratory testing.    CEFEPIME Value in next row Sensitive      <=1  SENSITIVEThis is a modified FDA-approved test that has been validated and its performance characteristics determined by the reporting laboratory.  This laboratory is certified under the Clinical Laboratory Improvement Amendments CLIA as qualified to perform high complexity clinical laboratory testing.    ERTAPENEM Value in next row Sensitive      <=1 SENSITIVEThis is a modified FDA-approved test that has been validated and its performance characteristics determined by the reporting laboratory.  This laboratory is certified under the Clinical Laboratory Improvement Amendments CLIA as qualified to perform high complexity clinical laboratory testing.    CEFTRIAXONE  Value in next row Sensitive      <=1 SENSITIVEThis is a modified FDA-approved test that has been validated and its performance characteristics determined by the reporting laboratory.  This laboratory is certified under the Clinical Laboratory Improvement Amendments CLIA as qualified to perform high complexity clinical laboratory testing.    CIPROFLOXACIN Value in next row Sensitive      <=1 SENSITIVEThis is a modified FDA-approved test that has been validated and its performance characteristics determined by the reporting laboratory.  This laboratory is certified under the Clinical Laboratory Improvement Amendments CLIA as qualified to perform high complexity clinical laboratory testing.    GENTAMICIN Value in next row Sensitive      <=1 SENSITIVEThis is a modified FDA-approved test that has been validated and its performance characteristics determined by the reporting laboratory.  This laboratory is certified under the Clinical Laboratory Improvement Amendments CLIA as qualified to perform high complexity clinical laboratory testing.    NITROFURANTOIN  Value in next row Sensitive      <=1 SENSITIVEThis is a modified FDA-approved test that has been validated and its performance characteristics determined by the reporting laboratory.  This laboratory  is certified under the Clinical Laboratory Improvement Amendments CLIA as qualified to perform high complexity clinical laboratory testing.    TRIMETH/SULFA Value in next row Sensitive      <=1 SENSITIVEThis is a modified FDA-approved test that has been validated and its performance characteristics determined by the reporting laboratory.  This laboratory is certified under the Clinical Laboratory Improvement Amendments CLIA as qualified to perform high complexity clinical laboratory testing.    AMPICILLIN/SULBACTAM Value in next row Sensitive      <=1 SENSITIVEThis is a modified FDA-approved test that has been validated and its performance characteristics determined by the reporting laboratory.  This laboratory is certified under the Clinical Laboratory Improvement Amendments CLIA as qualified to perform high complexity clinical laboratory testing.  PIP/TAZO Value in next row Sensitive      <=4 SENSITIVEThis is a modified FDA-approved test that has been validated and its performance characteristics determined by the reporting laboratory.  This laboratory is certified under the Clinical Laboratory Improvement Amendments CLIA as qualified to perform high complexity clinical laboratory testing.    MEROPENEM Value in next row Sensitive      <=4 SENSITIVEThis is a modified FDA-approved test that has been validated and its performance characteristics determined by the reporting laboratory.  This laboratory is certified under the Clinical Laboratory Improvement Amendments CLIA as qualified to perform high complexity clinical laboratory testing.    * >=100,000 COLONIES/mL ESCHERICHIA COLI  MRSA Next Gen by PCR, Nasal     Status: None   Collection Time: 10/21/24  5:18 AM   Specimen: Nasal Mucosa; Nasal Swab  Result Value Ref Range Status   MRSA by PCR Next Gen NOT DETECTED NOT DETECTED Final    Comment: (NOTE) The GeneXpert MRSA Assay (FDA approved for NASAL specimens only), is one component of a  comprehensive MRSA colonization surveillance program. It is not intended to diagnose MRSA infection nor to guide or monitor treatment for MRSA infections. Test performance is not FDA approved in patients less than 62 years old. Performed at [facility], [address], [place], [place] [address]   Culture, blood (Routine X 2) w Reflex to ID Panel     Status: None   Collection Time: 10/21/24  5:35 AM   Specimen: BLOOD LEFT ARM  Result Value Ref Range Status   Specimen Description BLOOD LEFT ARM  Final   Special Requests   Final    BOTTLES DRAWN AEROBIC AND ANAEROBIC Blood Culture adequate volume   Culture   Final    NO GROWTH 5 DAYS Performed at [facility], [address], [place], [place] [address]    Report Status 10/26/2024 FINAL  Final     Scheduled Meds:  apixaban   5 mg Oral BID   Chlorhexidine  Gluconate Cloth  6 each Topical Daily   diltiazem   30 mg Oral Q6H   ferrous sulfate   325 mg Oral Q M,W,F   folic acid   1 mg Oral Daily   insulin  aspart  0-5 Units Subcutaneous QHS   insulin  aspart  0-9 Units Subcutaneous TID WC   levothyroxine   75 mcg Oral Q48H   magnesium  oxide  400 mg Oral Daily   vitamin B-12  500 mcg Oral Daily   Continuous Infusions:  amiodarone  30 mg/hr (10/27/24 1600)    Procedures/Studies: CT ABDOMEN PELVIS W CONTRAST Result Date: 10/21/2024 EXAM: CT ABDOMEN AND PELVIS WITH CONTRAST 10/21/2024 12:12:35 AM TECHNIQUE: CT of the abdomen and pelvis was performed with the administration of 100 ml of intravenous contrast (iohexol  (OMNIPAQUE ) 350 MG/ML injection). Multiplanar reformatted images are provided for review. Automated exposure control, iterative reconstruction, and/or weight-based adjustment of the mA/kV was utilized to reduce the radiation dose to as low as reasonably achievable. COMPARISON: 05/16/2024 CLINICAL HISTORY: Increased lethargy and possible sepsis. FINDINGS: LIVER: The liver is within normal limits. GALLBLADDER AND BILE DUCTS: The gallbladder  is within normal limits. No biliary ductal dilatation. SPLEEN: The spleen is unremarkable. PANCREAS: The pancreas is unremarkable. ADRENAL GLANDS: The adrenal glands are within normal limits. KIDNEYS, URETERS AND BLADDER: Kidneys demonstrated normal enhancement pattern bilaterally. No renal calculi were observed. The ureters are within normal limits. The bladder is decompressed and unremarkable. No hydronephrosis. GI AND BOWEL: Stomach and small bowel are unremarkable. No obstructive or  inflammatory changes of the colon are seen. The appendix is within normal limits. There is no bowel obstruction. PERITONEUM AND RETROPERITONEUM: No free fluid is seen. No free air. VASCULATURE: Aortic calcifications are seen without aneurysmal dilatation. LYMPH NODES: No lymphadenopathy. REPRODUCTIVE ORGANS: The uterus has been surgically removed. BONES AND SOFT TISSUES: No acute bony abnormality is seen. No focal soft tissue abnormality. IMPRESSION: 1. No acute findings in the abdomen or pelvis. Electronically signed by: Oneil Devonshire MD 10/21/2024 12:23 AM EST RP Workstation: [id number]   CT Angio Chest PE W and/or Wo Contrast Result Date: 10/21/2024 EXAM: CTA of the Chest with contrast for PE 10/21/2024 12:12:35 AM TECHNIQUE: CTA of the chest was performed without and with the administration of 100 mL iohexol  (OMNIPAQUE ) 350 MG/ML injection. Multiplanar reformatted images are provided for review. MIP images are provided for review. Automated exposure control, iterative reconstruction, and/or weight based adjustment of the mA/kV was utilized to reduce the radiation dose to as low as reasonably achievable. COMPARISON: Chest x-ray from the previous day. CLINICAL HISTORY: Difficulty breathing. FINDINGS: PULMONARY ARTERIES: The pulmonary artery shows a normal branching pattern bilaterally. No filling defect to suggest pulmonary embolism is noted. Main pulmonary artery is normal in caliber. MEDIASTINUM: No cardiac enlargement is seen.  Mild coronary calcifications are noted. The thoracic aorta shows atherosclerotic calcifications without aneurysmal dilatation or dissection. The esophagus is within normal limits. LYMPH NODES: No hilar or mediastinal adenopathy is noted. LUNGS AND PLEURA: Lungs are well aerated bilaterally. No focal infiltrate or effusion is seen. No pneumothorax. SOFT TISSUES AND BONES: No acute soft tissue abnormality. No bony abnormality is noted. IMPRESSION: 1. No pulmonary embolism. Electronically signed by: Oneil Devonshire MD 10/21/2024 12:19 AM EST RP Workstation: [id number]   DG Chest Portable 1 View Result Date: 10/20/2024 EXAM: 1 VIEW(S) XRAY OF THE CHEST 10/20/2024 11:04:00 PM COMPARISON: Comparison with 02/29/2024. CLINICAL HISTORY: eval for pneumonia. eval for pneumonia FINDINGS: LUNGS AND PLEURA: Shallow inspiration. No focal pulmonary opacity. No pleural effusion. No pneumothorax. HEART AND MEDIASTINUM: Calcification of the aorta. BONES AND SOFT TISSUES: Degenerative changes in the spine and shoulders. IMPRESSION: 1. No focal airspace consolidation to suggest pneumonia. Evaluation limited by shallow inspiration. Electronically signed by: Elsie Gravely MD 10/20/2024 11:07 PM EST RP Workstation: [id number]    Alm Schneider, DO  [facility]  If 7PM-7AM, please contact night-coverage [email or web address] Password [email or web address] 10/27/2024, 6:25 PM   LOS: 6 days

## 2024-10-28 DIAGNOSIS — I480 Paroxysmal atrial fibrillation: Secondary | ICD-10-CM | POA: Diagnosis not present

## 2024-10-28 DIAGNOSIS — R5383 Other fatigue: Secondary | ICD-10-CM | POA: Diagnosis not present

## 2024-10-28 LAB — GLUCOSE, CAPILLARY
Glucose-Capillary: 111 mg/dL — ABNORMAL HIGH (ref 70–99)
Glucose-Capillary: 102 mg/dL — ABNORMAL HIGH (ref 70–99)
Glucose-Capillary: 112 mg/dL — ABNORMAL HIGH (ref 70–99)
Glucose-Capillary: 165 mg/dL — ABNORMAL HIGH (ref 70–99)

## 2024-10-28 LAB — MAGNESIUM: Magnesium: 1.8 mg/dL (ref 1.7–2.4)

## 2024-10-28 LAB — COMPREHENSIVE METABOLIC PANEL WITH GFR
ALT: 11 U/L (ref 0–44)
AST: 14 U/L — ABNORMAL LOW (ref 15–41)
Albumin: 2.5 g/dL — ABNORMAL LOW (ref 3.5–5.0)
Alkaline Phosphatase: 64 U/L (ref 38–126)
Anion gap: 8 (ref 5–15)
BUN: 9 mg/dL (ref 8–23)
CO2: 27 mmol/L (ref 22–32)
Calcium: 8.1 mg/dL — ABNORMAL LOW (ref 8.9–10.3)
Chloride: 105 mmol/L (ref 98–111)
Creatinine, Ser: 0.81 mg/dL (ref 0.44–1.00)
GFR, Estimated: >60 mL/min (ref >60)
Glucose, Bld: 106 mg/dL — ABNORMAL HIGH (ref 70–99)
Potassium: 4.2 mmol/L (ref 3.5–5.1)
Sodium: 140 mmol/L (ref 135–145)
Total Bilirubin: <0.2 mg/dL (ref 0.0–1.2)
Total Protein: 5.3 g/dL — ABNORMAL LOW (ref 6.5–8.1)

## 2024-10-28 MED ORDER — AMIODARONE HCL 200 MG PO TABS
200.0000 mg | ORAL_TABLET | Freq: Two times a day (BID) | ORAL | Status: DC
  Administered 2024-10-28 – 2024-10-29 (×3): 200 mg via ORAL
  Filled 2024-10-28 (×3): qty 1

## 2024-10-28 NOTE — Progress Notes (Signed)
 [facility] PROGRESS NOTE  Madison Davidson (DOB: 04-03-1951) FMW:[id number] PCP: System, Provider Not In  Brief Narrative: 73 year old female with a history of hypertension, right MCA stroke s/p stent 2022, insomnia, diabetes mellitus type 2, hypothyroidism, depression, paroxysmal atrial fibrillation presenting with lethargy.  [facility]  Patient is difficult historian.  Hx supplemented by daughter. She denies any chest pain, shortness breath, abdominal pain, nausea, vomiting, diarrhea. In the ED, the patient was afebrile and hemodynamically stable with oxygen saturation 100% room air.  WBC 9.1, hemoglobin 10.3, platelets 438.  Sodium 146, potassium 4.9, bicarbonate 11, serum creatinine 1.18.  Per EMS, the patient was noted to be hypotensive with glucose in the 50s. Lactic acid >9.0>>7.0.  TSH 4.860.  CTA chest negative for PE or infiltrates.  CT abdomen and pelvis negative for acute findings per chest x-ray is negative for infiltrates.  UA>50 WBC.  Patient was started fluids and ceftriaxone . Her mental status improved and returned to baseline.  Her hospitalization was prolonged due to development of afib with RVR.  She was started on IV diltiazem  with gradual improvement of her HR as her metabolic abnormalities improved.  Her IV diltiazem  was converted to po which she tolerated. In the morning of 10/26/2024, the patient developed RVR again.  She was started on IV amiodarone .  Hospitalization has been prolonged secondary to poorly controlled atrial fibrillation.  Cardiology was consulted to assist with management.   Subjective: HRs down overnight, pt has no chest pain, dyspnea, palpitations or other complaints. She's hungry, inquiring into time breakfast will arrive.   Objective: BP (!) 130/49   Pulse (!) 58   Temp 97.9 F (36.6 C) (Oral)   Resp 15   Ht 5' 2 (1.575 m)   Wt 61.4 kg   SpO2 99%   BMI 24.76 kg/m   Gen: No distress Pulm: Nonlabored, clear   CV: RRR, no MRG or edema GI: Soft, NT, ND, +BS  Neuro: Alert and oriented, interactive, follows commands. No new focal deficits. Ext: Warm, no deformities Skin: No rashes, lesions or ulcers on visualized skin   Assessment & Plan: Lethargy/acute metabolic encephalopathy - Secondary to dehydration and UTI - blood cultures neg to date - Continue ceftriaxone  2 g daily>>cefadroxil - initially received IV fluids - B12--331 - folate 5.7>>>replete - Check ammonia--23 - VBG--7.36/42/<31/38 - TSH 4.860 -11/28--back to baseline   UTI--E coli -UA>50 WBC - initially ceftriaxone  pending culture data - 11/28--transition to po cefadroxil - finished 7 days abx   Paroxysmal atrial fibrillation with RVR:  - 11/28 early am--developed afib with RVR - transitioned to po diltiazem  11/30>>continue - 12/1--developed RVR again>>started IV amio - Continue apixaban  - optimize K and Mag - appreciate cardiology > converted to SR overnight, change to oral amiodarone  load and monitor x24 hours. Will continue DOAC, plan to consolidate cardizem  dosing based on HR tmrw.   Lactic acidosis - VBG--7.36/42/<31/38 - lactate >9, now 1.9 - blood cultures neg x 2 - in part due to metformin  - Continue IV fluids>>improved   Hypernatremia - Secondary to volume depletion, resolved with IVF.   Hypothyroidism - Continue levothyroxine    Well-controlled diabetes mellitus type 2 - 01/26/2022 hemoglobin A1c 5.9 - continue novolog  sliding scale - 11/29 A1C--5.5   Mixed hyperlipidemia - Held statin temporarily due to elevated LFTs. LFTs normal, restart statin at discharge.   Major neurocognitive disorder - Daughter states that the patient has had a cognitive and functional decline over the past 6 months  Hypomagnesemia -repleted, level is 1.8, will repeat supplementation  [name], MD [facility] [email or web address] 10/28/2024, 11:28 AM

## 2024-10-28 NOTE — Plan of Care (Signed)

## 2024-10-28 NOTE — Progress Notes (Addendum)
 Progress Note  Patient Name: Madison Davidson Date of Encounter: 10/28/2024  Primary Cardiologist: Vishnu P Mallipeddi, MD  Interval Summary  Chart reviewed.  Patient has converted to sinus rhythm on IV amiodarone .  She does not report any sense of palpitations or shortness of breath.  Vital Signs  Vitals:   10/28/24 0025 10/28/24 0621 10/28/24 0700 10/28/24 0716  BP:  (!) 163/56 (!) 156/50   Pulse: 71  66   Resp: 14  (!) 8   Temp: 98 F (36.7 C)   97.9 F (36.6 C)  TempSrc: Axillary   Oral  SpO2: 93%  100%   Weight:      Height:        Intake/Output Summary (Last 24 hours) at 10/28/2024 0844 Last data filed at 10/28/2024 0600 Gross per 24 hour  Intake 366.73 ml  Output 1550 ml  Net -1183.27 ml   Filed Weights   10/20/24 2130 10/21/24 0532  Weight: 65.9 kg 61.4 kg    Physical Exam  GEN: No acute distress.   Neck: No JVD. Cardiac: RRR, no significant murmur or gallop.  Respiratory: Nonlabored. Clear to auscultation bilaterally. GI: Soft, nontender, bowel sounds present. MS: No edema. Neuro:  Nonfocal. Psych: Alert and oriented x 2. Normal affect.  ECG/Telemetry  Telemetry reviewed showing sinus rhythm.  Labs  Chemistry Recent Labs  Lab 10/21/24 [phone] 10/22/24 0508 10/23/24 0543 10/26/24 1325 10/27/24 0536 10/28/24 0323  NA 142 141   < > 137 141 140  K 5.2* 4.1   < > 4.5 4.0 4.2  CL 109 112*   < > 104 106 105  CO2 23 18*   < > 23 25 27   GLUCOSE 95 93   < > 174* 106* 106*  BUN 33* 29*   < > 9 8 9   CREATININE 0.95 0.92   < > 0.74 0.72 0.81  CALCIUM  8.1* 7.8*   < > 8.3* 8.3* 8.1*  PROT 6.1* 5.5*  --   --   --  5.3*  ALBUMIN 2.7* 2.3*  --   --   --  2.5*  AST 112* 52*  --   --   --  14*  ALT 43 30  --   --   --  11  ALKPHOS 59 60  --   --   --  64  BILITOT <0.2 <0.2  --   --   --  <0.2  GFRNONAA >60 >60   < > >60 >60 >60  ANIONGAP 10 11   < > 10 9 8    < > = values in this interval not displayed.    Hematology Recent Labs  Lab  10/22/24 0508  WBC 6.8  RBC 3.26*  HGB 9.1*  HCT 29.9*  MCV 91.7  MCH 27.9  MCHC 30.4  RDW 14.3  PLT 309    Assessment & Plan  1.  Paroxysmal to persistent atrial fibrillation and atypical atrial flutter with RVR.  CHA2DS2-VASc score is 7.  She has converted to sinus rhythm on IV amiodarone .  Remains on Eliquis  5 mg twice daily and Cardizem  30 mg every 6 hours otherwise.  Heart rate in the 70s.   2.  Metabolic encephalopathy in the setting of E. coli UTI.   3.  Hypothyroidism on Synthroid .  TSH 4.86 and free T4 0.84.  Follow-up ECG.  Convert from IV amiodarone  to oral load at 200 mg twice daily.  Can likely convert to Cardizem  CD 120 mg tomorrow  depending on heart rate in sinus rhythm, otherwise continue Eliquis  5 mg twice daily.  Can transfer out of unit on telemetry from a cardiac perspective.  For questions or updates, please contact [facility] Please consult [email or web address] for contact info under   Signed, Jayson Sierras, MD  10/28/2024, 8:44 AM

## 2024-10-28 NOTE — TOC Progression Note (Signed)
 Transition of Care [facility]) - Progression Note    Patient Details  Name: Madison Davidson MRN: [id number] Date of Birth: 04-07-1951  Transition of Care [facility]) CM/SW Contact  Mcarthur Saddie Kim, [place] Phone Number: 10/28/2024, 9:50 AM  Clinical Narrative:  Per MD, possible d/c tomorrow to SNF. Donald at [facility] updated. TOC will follow.      Expected Discharge Plan: Skilled Nursing Facility Barriers to Discharge: Continued Medical Work up               Expected Discharge Plan and Services In-house Referral: Clinical Social Work Discharge Planning Services: NA Post Acute Care Choice: Skilled Nursing Facility Living arrangements for the past 2 months: Assisted Living Facility                 DME Arranged: N/A DME Agency: NA                   Social Drivers of Health (SDOH) Interventions SDOH Screenings   Food Insecurity: No Food Insecurity (10/21/2024)  Housing: Low Risk  (10/21/2024)  Transportation Needs: No Transportation Needs (10/21/2024)  Utilities: Not At Risk (10/21/2024)  Social Connections: Unknown (10/21/2024)  Tobacco Use: Low Risk  (10/20/2024)    Readmission Risk Interventions    10/21/2024   10:37 AM  Readmission Risk Prevention Plan  Transportation Screening Complete  PCP or Specialist Appt within 3-5 Days Complete  HRI or Home Care Consult Complete  Social Work Consult for Recovery Care Planning/Counseling Complete  Palliative Care Screening Not Applicable  Medication Review Oceanographer) Complete

## 2024-10-29 DIAGNOSIS — I480 Paroxysmal atrial fibrillation: Secondary | ICD-10-CM | POA: Diagnosis not present

## 2024-10-29 DIAGNOSIS — R5383 Other fatigue: Secondary | ICD-10-CM | POA: Diagnosis not present

## 2024-10-29 DIAGNOSIS — I10 Essential (primary) hypertension: Secondary | ICD-10-CM

## 2024-10-29 LAB — GLUCOSE, CAPILLARY
Glucose-Capillary: 101 mg/dL — ABNORMAL HIGH (ref 70–99)
Glucose-Capillary: 133 mg/dL — ABNORMAL HIGH (ref 70–99)

## 2024-10-29 MED ORDER — AMIODARONE HCL 200 MG PO TABS: 200.0000 mg | ORAL_TABLET | Freq: Two times a day (BID) | ORAL | Status: AC

## 2024-10-29 MED ORDER — LISINOPRIL 10 MG PO TABS
20.0000 mg | ORAL_TABLET | Freq: Every day | ORAL | Status: DC
  Administered 2024-10-29: 20 mg via ORAL
  Filled 2024-10-29: qty 2

## 2024-10-29 NOTE — TOC Transition Note (Signed)
 Transition of Care [facility]) - Discharge Note   Patient Details  Name: Madison Davidson MRN: [id number] Date of Birth: 08/28/1951  Transition of Care [facility]) CM/SW Contact:  Lucie Lunger, LCSWA Phone Number: 10/29/2024, 1:06 PM   Clinical Narrative:    CSW updated that pt is medically stable for D/C to SNF today. CSW spoke to [place] with [facility] who states they are able to accept pt today. CSW sent D/C clinicals to facility via HUB. CSW to send room and report to RN. Med necessity printed to nurses station. EMS to be called once RN is ready. TOC signing off.   Final next level of care: Skilled Nursing Facility Barriers to Discharge: Barriers Resolved   Patient Goals and CMS Choice Patient states their goals for this hospitalization and ongoing recovery are:: Go to SNF CMS Medicare.gov Compare Post Acute Care list provided to:: Patient Represenative (must comment) Choice offered to / list presented to : Adult Children Orason ownership interest in [facility]provided to:: Adult Children    Discharge Placement                Patient to be transferred to facility by: EMS Name of family member notified: daughter Samule Patient and family notified of of transfer: 10/29/24  Discharge Plan and Services Additional resources added to the After Visit Summary for   In-house Referral: Clinical Social Work Discharge Planning Services: NA Post Acute Care Choice: Skilled Nursing Facility          DME Arranged: N/A DME Agency: NA                  Social Drivers of Health (SDOH) Interventions SDOH Screenings   Food Insecurity: No Food Insecurity (10/21/2024)  Housing: Davidson Risk  (10/21/2024)  Transportation Needs: No Transportation Needs (10/21/2024)  Utilities: Not At Risk (10/21/2024)  Social Connections: Unknown (10/21/2024)  Tobacco Use: Davidson Risk  (10/20/2024)     Readmission Risk Interventions    10/21/2024   10:37 AM  Readmission Risk Prevention  Plan  Transportation Screening Complete  PCP or Specialist Appt within 3-5 Days Complete  HRI or Home Care Consult Complete  Social Work Consult for Recovery Care Planning/Counseling Complete  Palliative Care Screening Not Applicable  Medication Review Oceanographer) Complete

## 2024-10-29 NOTE — Plan of Care (Signed)

## 2024-10-29 NOTE — Progress Notes (Signed)
 Report called to [facility] facility. Daughter was called and updated of patient leaving. No issues with discharge process. Waiting for EMS to pick up.

## 2024-10-29 NOTE — Care Management Important Message (Signed)
 Important Message  Patient Details  Name: Madison Davidson MRN: [id number] Date of Birth: [birth year]   Important Message Given:  Yes - Medicare IM     Jerrico Covello L Ramesh Moan 10/29/2024, 1:17 PM

## 2024-10-29 NOTE — Discharge Summary (Signed)
 Physician Discharge Summary   Patient: Madison Davidson MRN: [id number] DOB: 12-Jul-1951  Admit date:     10/20/2024  Discharge date: 10/29/24  Discharge Physician: [name]   PCP: System, Provider Not In   Recommendations at discharge:  Follow up with cardiology after discharge. Started on amiodarone , if this becomes a long term medication, closer TSH monitoring will be necessary (on synthroid  as well). Follow up with PCP for ongoing management of chronic medical conditions.  Discharge Diagnoses: Principal Problem:   Lethargy Active Problems:   Paroxysmal atrial fibrillation with RVR (HCC)   PAF (paroxysmal atrial fibrillation) (HCC)   Acute metabolic encephalopathy   Lactic acidosis   Cystitis   Hypernatremia  Hospital Course: 73 year old female with a history of hypertension, right MCA stroke s/p stent 2022, insomnia, diabetes mellitus type 2, hypothyroidism, depression, paroxysmal atrial fibrillation presenting with lethargy.  [facility]  Patient is difficult historian.  Hx supplemented by daughter. She denies any chest pain, shortness breath, abdominal pain, nausea, vomiting, diarrhea. In the ED, the patient was afebrile and hemodynamically stable with oxygen saturation 100% room air.  WBC 9.1, hemoglobin 10.3, platelets 438.  Sodium 146, potassium 4.9, bicarbonate 11, serum creatinine 1.18.  Per EMS, the patient was noted to be hypotensive with glucose in the 50s. Lactic acid >9.0>>7.0.  TSH 4.860.  CTA chest negative for PE or infiltrates.  CT abdomen and pelvis negative for acute findings per chest x-ray is negative for infiltrates.  UA>50 WBC.  Patient was started fluids and ceftriaxone . Her mental status improved and returned to baseline.  Her hospitalization was prolonged due to development of afib with RVR.  She was started on IV diltiazem  with gradual improvement of her HR as her metabolic abnormalities improved.  Her IV diltiazem  was converted  to po which she tolerated. In the morning of 10/26/2024, the patient developed RVR again.  She was started on IV amiodarone .  Hospitalization has been prolonged secondary to poorly controlled atrial fibrillation.  Cardiology was consulted to assist with management.   Assessment and Plan: Lethargy/acute metabolic encephalopathy - Secondary to dehydration and UTI - blood cultures neg - Continue ceftriaxone  2 g daily>>cefadroxil  - initially received IV fluids - B12--331 - folate 5.7>>>replete - Check ammonia--23 - VBG--7.36/42/<31/38 - TSH 4.860 -11/28--back to baseline   Folic acid  deficiency:  - Continue daily supplementation along with B12 empirically.   UTI--E coli: Pansensitive, completed 7 days Tx with ceftriaxone  then cefadroxil .    Paroxysmal atrial fibrillation with RVR:  - 11/28 early am--developed afib with RVR - transitioned to po diltiazem  11/30>>continue - 12/1--developed RVR again>>started IV amio - optimize K and Mag - appreciate cardiology > converted to SR overnight, changed to oral amiodarone  load which will continue at discharge. Will continue DOAC. No ongoing diltiazem  recommended per cardiology.    Lactic acidosis: Resolved. Due to hypovolemia and metformin .    Hypernatremia - Secondary to volume depletion, resolved with IVF.   Hypothyroidism: Recent TSH 4.86. - Continue levothyroxine  - Monitor TSH   Well-controlled diabetes mellitus type 2 - 11/29 A1C--5.5. Ok to continue metformin .   Mixed hyperlipidemia - Held statin temporarily due to elevated LFTs. LFTs normal, restart statin at discharge.   Major neurocognitive disorder - Daughter states that the patient has had a cognitive and functional decline over the past 6 months, has returned to recent baseline. - Start melatonin in lieu of belsomra. Continue remeron. Not taking zoloft  HTN: Continue lisinopril  20mg . Note this is  a home medication and has not caused a true allergy of any kind.    Hypomagnesemia -repleted  DNR: Noted.  Wounds to L heel, R/L great toe: - Cleanse wounds with NS, pat dry. Apply Xeroform to wound bed and cover with silicone foam dressing.  Change daily.  Consultants: Cardiology Procedures performed: None  Disposition: Skilled nursing facility Diet recommendation:  Cardiac and Carb modified diet DISCHARGE MEDICATION: Allergies as of 10/29/2024       Reactions   Augmentin [amoxicillin-pot Clavulanate] Other (See Comments)   No reaction listed on MAR   Doxycycline Other (See Comments)   No reaction listed on MAR   Lisinopril  Other (See Comments)   No reaction listed on MAR   Norvasc [amlodipine] Other (See Comments)   No reaction listed on MAR        Medication List     STOP taking these medications    Ayr Saline Nasal Drops 0.65 % Soln Generic drug: Saline   Belsomra 5 MG Tabs Generic drug: Suvorexant   Geri-Tussin DM 10-100 MG/5ML liquid Generic drug: dextromethorphan-guaiFENesin   loperamide 2 MG tablet Commonly known as: IMODIUM A-D   sertraline 25 MG tablet Commonly known as: ZOLOFT       TAKE these medications    acetaminophen  325 MG tablet Commonly known as: TYLENOL  Take 2 tablets (650 mg total) by mouth every 6 (six) hours as needed for mild pain, fever or headache.   amiodarone  200 MG tablet Commonly known as: PACERONE  Take 1 tablet (200 mg total) by mouth 2 (two) times daily.   apixaban  5 MG Tabs tablet Commonly known as: ELIQUIS  Take 1 tablet (5 mg total) by mouth 2 (two) times daily.   atorvastatin  40 MG tablet Commonly known as: LIPITOR  Take 1 tablet (40 mg total) by mouth daily.   cholecalciferol 25 MCG (1000 UNIT) tablet Commonly known as: VITAMIN D3 Take 1,000 Units by mouth daily.   cyanocobalamin  500 MCG tablet Commonly known as: VITAMIN B12 Take 1 tablet (500 mcg total) by mouth daily.   divalproex  125 MG DR tablet Commonly known as: DEPAKOTE  Take 125 mg by mouth 2 (two) times  daily.   famotidine 20 MG tablet Commonly known as: PEPCID Take 20 mg by mouth at bedtime.   ferrous sulfate  325 (65 FE) MG tablet Take 325 mg by mouth every Monday, Wednesday, and Friday.   folic acid  1 MG tablet Commonly known as: FOLVITE  Take 1 tablet (1 mg total) by mouth daily.   levothyroxine  50 MCG tablet Commonly known as: SYNTHROID  Take 50 mcg by mouth every other day.   levothyroxine  75 MCG tablet Commonly known as: SYNTHROID  Take 75 mcg by mouth every other day.   lisinopril  20 MG tablet Commonly known as: ZESTRIL  Take 1 tablet (20 mg total) by mouth daily.   melatonin 3 MG Tabs tablet Take 2 tablets (6 mg total) by mouth at bedtime as needed.   metFORMIN  1000 MG tablet Commonly known as: GLUCOPHAGE  Take 1 tablet (1,000 mg total) by mouth 2 (two) times daily with a meal.   mirtazapine 7.5 MG tablet Commonly known as: REMERON Take 7.5 mg by mouth at bedtime.   omeprazole 40 MG capsule Commonly known as: PRILOSEC Take 40 mg by mouth daily.   ondansetron  4 MG tablet Commonly known as: ZOFRAN  Take 4 mg by mouth every 6 (six) hours as needed for nausea or vomiting.   polyethylene glycol 17 g packet Commonly known as: MIRALAX  / GLYCOLAX  Take 17 g  by mouth daily as needed for mild constipation.        Contact information for follow-up providers     Debera Jayson MATSU, MD Follow up.   Specialty: Cardiology Contact information: [address] [employer] [place] [address] [phone]              Contact information for after-discharge care     Destination     [facility] .   Service: Skilled Nursing Contact information: [address] [place]  [address] [phone]                    Discharge Exam: Fredricka Weights   10/20/24 2130 10/21/24 0532  Weight: 65.9 kg 61.4 kg  BP (!) 134/53   Pulse 70   Temp 98.1 F (36.7 C) (Oral)   Resp 12   Ht 5' 2 (1.575 m)   Wt 61.4 kg   SpO2 100%   BMI 24.76 kg/m    Well-appearing elderly female in no distress eating lunch with daughter at bedside Clear, nonlabored RRR, no JVD or pitting edema Alert, interactive, conversant  Condition at discharge: stable  The results of significant diagnostics from this hospitalization (including imaging, microbiology, ancillary and laboratory) are listed below for reference.   Imaging Studies: CT ABDOMEN PELVIS W CONTRAST Result Date: 10/21/2024 EXAM: CT ABDOMEN AND PELVIS WITH CONTRAST 10/21/2024 12:12:35 AM TECHNIQUE: CT of the abdomen and pelvis was performed with the administration of 100 ml of intravenous contrast (iohexol  (OMNIPAQUE ) 350 MG/ML injection). Multiplanar reformatted images are provided for review. Automated exposure control, iterative reconstruction, and/or weight-based adjustment of the mA/kV was utilized to reduce the radiation dose to as low as reasonably achievable. COMPARISON: 05/16/2024 CLINICAL HISTORY: Increased lethargy and possible sepsis. FINDINGS: LIVER: The liver is within normal limits. GALLBLADDER AND BILE DUCTS: The gallbladder is within normal limits. No biliary ductal dilatation. SPLEEN: The spleen is unremarkable. PANCREAS: The pancreas is unremarkable. ADRENAL GLANDS: The adrenal glands are within normal limits. KIDNEYS, URETERS AND BLADDER: Kidneys demonstrated normal enhancement pattern bilaterally. No renal calculi were observed. The ureters are within normal limits. The bladder is decompressed and unremarkable. No hydronephrosis. GI AND BOWEL: Stomach and small bowel are unremarkable. No obstructive or inflammatory changes of the colon are seen. The appendix is within normal limits. There is no bowel obstruction. PERITONEUM AND RETROPERITONEUM: No free fluid is seen. No free air. VASCULATURE: Aortic calcifications are seen without aneurysmal dilatation. LYMPH NODES: No lymphadenopathy. REPRODUCTIVE ORGANS: The uterus has been surgically removed. BONES AND SOFT TISSUES: No acute bony  abnormality is seen. No focal soft tissue abnormality. IMPRESSION: 1. No acute findings in the abdomen or pelvis. Electronically signed by: Oneil Devonshire MD 10/21/2024 12:23 AM EST RP Workstation: [id number]   CT Angio Chest PE W and/or Wo Contrast Result Date: 10/21/2024 EXAM: CTA of the Chest with contrast for PE 10/21/2024 12:12:35 AM TECHNIQUE: CTA of the chest was performed without and with the administration of 100 mL iohexol  (OMNIPAQUE ) 350 MG/ML injection. Multiplanar reformatted images are provided for review. MIP images are provided for review. Automated exposure control, iterative reconstruction, and/or weight based adjustment of the mA/kV was utilized to reduce the radiation dose to as low as reasonably achievable. COMPARISON: Chest x-ray from the previous day. CLINICAL HISTORY: Difficulty breathing. FINDINGS: PULMONARY ARTERIES: The pulmonary artery shows a normal branching pattern bilaterally. No filling defect to suggest pulmonary embolism is noted. Main pulmonary artery is normal in caliber. MEDIASTINUM: No cardiac enlargement  is seen. Mild coronary calcifications are noted. The thoracic aorta shows atherosclerotic calcifications without aneurysmal dilatation or dissection. The esophagus is within normal limits. LYMPH NODES: No hilar or mediastinal adenopathy is noted. LUNGS AND PLEURA: Lungs are well aerated bilaterally. No focal infiltrate or effusion is seen. No pneumothorax. SOFT TISSUES AND BONES: No acute soft tissue abnormality. No bony abnormality is noted. IMPRESSION: 1. No pulmonary embolism. Electronically signed by: Oneil Devonshire MD 10/21/2024 12:19 AM EST RP Workstation: [id number]   DG Chest Portable 1 View Result Date: 10/20/2024 EXAM: 1 VIEW(S) XRAY OF THE CHEST 10/20/2024 11:04:00 PM COMPARISON: Comparison with 02/29/2024. CLINICAL HISTORY: eval for pneumonia. eval for pneumonia FINDINGS: LUNGS AND PLEURA: Shallow inspiration. No focal pulmonary opacity. No pleural effusion. No  pneumothorax. HEART AND MEDIASTINUM: Calcification of the aorta. BONES AND SOFT TISSUES: Degenerative changes in the spine and shoulders. IMPRESSION: 1. No focal airspace consolidation to suggest pneumonia. Evaluation limited by shallow inspiration. Electronically signed by: Elsie Gravely MD 10/20/2024 11:07 PM EST RP Workstation: [id number]    Microbiology: Results for orders placed or performed during the hospital encounter of 10/20/24  Culture, blood (Routine X 2) w Reflex to ID Panel     Status: None   Collection Time: 10/20/24  9:56 PM   Specimen: BLOOD LEFT ARM  Result Value Ref Range Status   Specimen Description BLOOD LEFT ARM  Final   Special Requests   Final    BOTTLES DRAWN AEROBIC AND ANAEROBIC Blood Culture results may not be optimal due to an inadequate volume of blood received in culture bottles   Culture   Final    NO GROWTH 5 DAYS Performed at [facility] [address] [place], [place] [address]    Report Status 10/26/2024 FINAL  Final  Urine Culture (for pregnant, neutropenic or urologic patients or patients with an indwelling urinary catheter)     Status: Abnormal   Collection Time: 10/20/24 10:51 PM   Specimen: Urine, Clean Catch  Result Value Ref Range Status   Specimen Description   Final    URINE, CLEAN CATCH Performed at [facility], [address] [place], [place] [address]    Special Requests   Final    NONE Performed at [facility], [address] [place], [place] [address]    Culture >=100,000 COLONIES/mL ESCHERICHIA COLI (A)  Final   Report Status 10/23/2024 FINAL  Final   Organism ID, Bacteria ESCHERICHIA COLI (A)  Final      Susceptibility   Escherichia coli - MIC*    AMPICILLIN 8 SENSITIVE Sensitive     CEFAZOLIN (URINE) Value in next row Sensitive      <=1 SENSITIVEThis is a modified FDA-approved test that has been validated and its performance characteristics determined by the reporting laboratory.  This laboratory is certified under the  Clinical Laboratory Improvement Amendments CLIA as qualified to perform high complexity clinical laboratory testing.    CEFEPIME Value in next row Sensitive      <=1 SENSITIVEThis is a modified FDA-approved test that has been validated and its performance characteristics determined by the reporting laboratory.  This laboratory is certified under the Clinical Laboratory Improvement Amendments CLIA as qualified to perform high complexity clinical laboratory testing.    ERTAPENEM Value in next row Sensitive      <=1 SENSITIVEThis is a modified FDA-approved test that has been validated and its performance characteristics determined by the reporting laboratory.  This laboratory is certified under the Clinical Laboratory Improvement Amendments CLIA as qualified  to perform high complexity clinical laboratory testing.    CEFTRIAXONE  Value in next row Sensitive      <=1 SENSITIVEThis is a modified FDA-approved test that has been validated and its performance characteristics determined by the reporting laboratory.  This laboratory is certified under the Clinical Laboratory Improvement Amendments CLIA as qualified to perform high complexity clinical laboratory testing.    CIPROFLOXACIN Value in next row Sensitive      <=1 SENSITIVEThis is a modified FDA-approved test that has been validated and its performance characteristics determined by the reporting laboratory.  This laboratory is certified under the Clinical Laboratory Improvement Amendments CLIA as qualified to perform high complexity clinical laboratory testing.    GENTAMICIN Value in next row Sensitive      <=1 SENSITIVEThis is a modified FDA-approved test that has been validated and its performance characteristics determined by the reporting laboratory.  This laboratory is certified under the Clinical Laboratory Improvement Amendments CLIA as qualified to perform high complexity clinical laboratory testing.    NITROFURANTOIN  Value in next row Sensitive       <=1 SENSITIVEThis is a modified FDA-approved test that has been validated and its performance characteristics determined by the reporting laboratory.  This laboratory is certified under the Clinical Laboratory Improvement Amendments CLIA as qualified to perform high complexity clinical laboratory testing.    TRIMETH/SULFA Value in next row Sensitive      <=1 SENSITIVEThis is a modified FDA-approved test that has been validated and its performance characteristics determined by the reporting laboratory.  This laboratory is certified under the Clinical Laboratory Improvement Amendments CLIA as qualified to perform high complexity clinical laboratory testing.    AMPICILLIN/SULBACTAM Value in next row Sensitive      <=1 SENSITIVEThis is a modified FDA-approved test that has been validated and its performance characteristics determined by the reporting laboratory.  This laboratory is certified under the Clinical Laboratory Improvement Amendments CLIA as qualified to perform high complexity clinical laboratory testing.    PIP/TAZO Value in next row Sensitive      <=4 SENSITIVEThis is a modified FDA-approved test that has been validated and its performance characteristics determined by the reporting laboratory.  This laboratory is certified under the Clinical Laboratory Improvement Amendments CLIA as qualified to perform high complexity clinical laboratory testing.    MEROPENEM Value in next row Sensitive      <=4 SENSITIVEThis is a modified FDA-approved test that has been validated and its performance characteristics determined by the reporting laboratory.  This laboratory is certified under the Clinical Laboratory Improvement Amendments CLIA as qualified to perform high complexity clinical laboratory testing.    * >=100,000 COLONIES/mL ESCHERICHIA COLI  MRSA Next Gen by PCR, Nasal     Status: None   Collection Time: 10/21/24  5:18 AM   Specimen: Nasal Mucosa; Nasal Swab  Result Value Ref Range Status   MRSA  by PCR Next Gen NOT DETECTED NOT DETECTED Final    Comment: (NOTE) The GeneXpert MRSA Assay (FDA approved for NASAL specimens only), is one component of a comprehensive MRSA colonization surveillance program. It is not intended to diagnose MRSA infection nor to guide or monitor treatment for MRSA infections. Test performance is not FDA approved in patients less than 63 years old. Performed at [facility], [address], [place], [place] [address]   Culture, blood (Routine X 2) w Reflex to ID Panel     Status: None   Collection Time: 10/21/24  5:35 AM   Specimen: BLOOD LEFT  ARM  Result Value Ref Range Status   Specimen Description BLOOD LEFT ARM  Final   Special Requests   Final    BOTTLES DRAWN AEROBIC AND ANAEROBIC Blood Culture adequate volume   Culture   Final    NO GROWTH 5 DAYS Performed at [facility], [address], [place], [place] [address]    Report Status 10/26/2024 FINAL  Final    Labs: CBC: No results for input(s): WBC, NEUTROABS, HGB, HCT, MCV, PLT in the last 168 hours. Basic Metabolic Panel: Recent Labs  Lab 10/24/24 0300 10/25/24 0412 10/26/24 1325 10/27/24 0536 10/28/24 0323  NA 138 138 137 141 140  K 4.0 4.0 4.5 4.0 4.2  CL 106 106 104 106 105  CO2 24 24 23 25 27   GLUCOSE 108* 120* 174* 106* 106*  BUN 12 10 9 8 9   CREATININE 0.66 0.62 0.74 0.72 0.81  CALCIUM  7.9* 8.0* 8.3* 8.3* 8.1*  MG 2.7* 2.2 1.9 2.0 1.8   Liver Function Tests: Recent Labs  Lab 10/28/24 0323  AST 14*  ALT 11  ALKPHOS 64  BILITOT <0.2  PROT 5.3*  ALBUMIN 2.5*   CBG: Recent Labs  Lab 10/28/24 1130 10/28/24 1647 10/28/24 2121 10/29/24 0734 10/29/24 1155  GLUCAP 102* 112* 165* 101* 133*    Discharge time spent: greater than 30 minutes.  Signed: [name], MD [facility] 10/29/2024

## 2024-10-29 NOTE — Progress Notes (Addendum)
   Progress Note  Patient Name: Madison Davidson Date of Encounter: 10/29/2024  Primary Cardiologist: Diannah SHAUNNA Maywood, MD  Interval Summary  Breathing good  No CP  Vital Signs  Vitals:   10/29/24 0604 10/29/24 0605 10/29/24 0734 10/29/24 0800  BP: (!) 146/63   (!) 172/82  Pulse: (!) 55 77  89  Resp: 13 14  16   Temp:   98.1 F (36.7 C)   TempSrc:   Oral   SpO2: 98% 98%  100%  Weight:      Height:        Intake/Output Summary (Last 24 hours) at 10/29/2024 1016 Last data filed at 10/29/2024 0906 Gross per 24 hour  Intake 340 ml  Output 1000 ml  Net -660 ml   Filed Weights   10/20/24 2130 10/21/24 0532  Weight: 65.9 kg 61.4 kg    Physical Exam  GEN: No acute distress.   Neck: No JVD. Cardiac: RRR, no murmur  Respiratory: . Clear to auscultation bilaterally. GI: Soft, nontender,  MS: No edema.    ECG/Telemetry  SR   Labs  Chemistry Recent Labs  Lab 10/26/24 1325 10/27/24 0536 10/28/24 0323  NA 137 141 140  K 4.5 4.0 4.2  CL 104 106 105  CO2 23 25 27   GLUCOSE 174* 106* 106*  BUN 9 8 9   CREATININE 0.74 0.72 0.81  CALCIUM  8.3* 8.3* 8.1*  PROT  --   --  5.3*  ALBUMIN  --   --  2.5*  AST  --   --  14*  ALT  --   --  11  ALKPHOS  --   --  64  BILITOT  --   --  <0.2  GFRNONAA >60 >60 >60  ANIONGAP 10 9 8     Hematology No results for input(s): WBC, RBC, HGB, HCT, MCV, MCH, MCHC, RDW, PLT in the last 168 hours.   Assessment & Plan  1.  Paroxysmal to persistent atrial fibrillation and atypical atrial flutter with RVR.  CHA2DS2-VASc score is 7.  She has converted to sinus rhythm on IV amiodarone .  Now on PO amiodarone     Given presentation, not unreasonable  to keep on amiodarone  with goal to maintain SR Pt is DNR.   BUT will need to follow thyroid function closely on this   May need to d/c Keep on 200 bid now     Continue Eliquis  5 bid    I would stop cardiazem   I do not think at this dose helping HR much   Also, may get  higher livels with amio use    2  HTN  BP is high today    She was on lisinopril  20 mg prior to admit  I w;ould add back and follow  ( I have asked pharmacy to confirm allergy listed on chart)  3.  Metabolic encephalopathy in the setting of E. coli UTI.  IIMproved     4.  Hypothyroidism on Synthroid .  TSH 4.86 and free T4 0.84. For questions or updates, please contact [facility] Please consult [email or web address] for contact info under   Signed, Vina Gull, MD  10/29/2024, 10:16 AM

## 2024-10-29 NOTE — NC FL2 (Signed)
 [place]  MEDICAID FL2 LEVEL OF CARE FORM     IDENTIFICATION  Patient Name: Madison Davidson Birthdate: [birth year] Sex: female Admission Date (Current Location): 10/20/2024  [facility] and [place] Number:  [employer] and Address:  [facility],  [address], [place] [address]      Provider Number: [id number]  Attending Physician Name and Address:  Bryn Bernardino NOVAK, MD  Relative Name and Phone Number:  trinidad,sabrina (Daughter)  [phone]    Current Level of Care: Hospital Recommended Level of Care: Skilled Nursing Facility Prior Approval Number:    Date Approved/Denied:   PASRR Number: [id number]  Discharge Plan: SNF    Current Diagnoses: Patient Active Problem List   Diagnosis Date Noted   Lethargy 10/21/2024   Acute metabolic encephalopathy 10/21/2024   Lactic acidosis 10/21/2024   Cystitis 10/21/2024   Hypernatremia 10/21/2024   PAF (paroxysmal atrial fibrillation) (HCC) 07/28/2024   Long term (current) use of anticoagulants 07/28/2024   ERRONEOUS ENCOUNTER--DISREGARD 04/30/2024   Paroxysmal atrial fibrillation with RVR (HCC) 06/05/2022   Elevated troponin 06/05/2022   Hypoglycemia 01/26/2022   Coronary artery disease    History of CVA (cerebrovascular accident)    Left knee pain    Generalized weakness    Syncope 06/11/2021   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   Intracranial atherosclerosis 05/11/2021   Cerebral thrombosis with cerebral infarction 05/11/2021   Slurred speech 05/08/2021   Hyperlipidemia, mixed 05/08/2021   Uncontrolled type 2 diabetes mellitus with hypoglycemia, without long-term current use of insulin  (HCC) 05/08/2021   AKI (acute kidney injury) 05/08/2021   Anemia of chronic disease 05/08/2021   CVA (cerebral vascular accident) (HCC) 05/08/2021   Hypertension 08/09/2015    Orientation RESPIRATION BLADDER Height & Weight     Self, Place  Normal Incontinent Weight: 135 lb 5.8 oz (61.4 kg) Height:  5'  2 (157.5 cm)  BEHAVIORAL SYMPTOMS/MOOD NEUROLOGICAL BOWEL NUTRITION STATUS      Continent Diet (heart healthy/ carb modified)  AMBULATORY STATUS COMMUNICATION OF NEEDS Skin   Extensive Assist Verbally PU Stage and Appropriate Care (L heel deep tissue pressure injury- evolving with skin sloughing  Deep purple maroon discoloration to tips of L/R great toe with surrounding skin rubor  concern for possible perfusion issues, patient with history of DM II.  Change dressing daily)                       Personal Care Assistance Level of Assistance  Bathing, Feeding, Dressing Bathing Assistance: Maximum assistance Feeding assistance: Limited assistance Dressing Assistance: Maximum assistance     Functional Limitations Info  Sight, Hearing, Speech Sight Info: Adequate Hearing Info: Adequate Speech Info: Adequate    SPECIAL CARE FACTORS FREQUENCY  PT (By licensed PT), OT (By licensed OT)     PT Frequency: 5x/wk OT Frequency: 5x/wk            Contractures Contractures Info: Not present    Additional Factors Info  Code Status, Allergies, Insulin  Sliding Scale Code Status Info: DNR Allergies Info: Augmentin (Amoxicillin-pot Clavulanate), Doxycycline, Lisinopril , Norvasc (Amlodipine)   Insulin  Sliding Scale Info: See MAR       Current Medications (10/29/2024):  This is the current hospital active medication list Current Facility-Administered Medications  Medication Dose Route Frequency Provider Last Rate Last Admin   acetaminophen  (TYLENOL ) tablet 500 mg  500 mg Oral Q6H PRN Hall, Carole N, DO       amiodarone  (PACERONE ) tablet 200 mg  200 mg Oral BID Debera Jayson MATSU, MD   200 mg at 10/29/24 9182   apixaban  (ELIQUIS ) tablet 5 mg  5 mg Oral BID Shona Laurence N, DO   5 mg at 10/29/24 0818   Chlorhexidine  Gluconate Cloth 2 % PADS 6 each  6 each Topical Daily Shona Laurence SAILOR, DO   6 each at 10/29/24 0818   ferrous sulfate  tablet 325 mg  325 mg Oral Q M,W,F Shona Laurence SAILOR, DO   325  mg at 10/28/24 9094   folic acid  (FOLVITE ) tablet 1 mg  1 mg Oral Daily Tat, David, MD   1 mg at 10/29/24 9182   insulin  aspart (novoLOG ) injection 0-5 Units  0-5 Units Subcutaneous QHS Shona Laurence N, DO       insulin  aspart (novoLOG ) injection 0-9 Units  0-9 Units Subcutaneous TID WC Shona Laurence N, DO   1 Units at 10/29/24 1157   levothyroxine  (SYNTHROID ) tablet 75 mcg  75 mcg Oral Q48H Shona Laurence SAILOR, DO   75 mcg at 10/29/24 9393   lisinopril  (ZESTRIL ) tablet 20 mg  20 mg Oral Daily Ross, Paula V, MD   20 mg at 10/29/24 1151   magnesium  oxide (MAG-OX) tablet 400 mg  400 mg Oral Daily Tat, Alm, MD   400 mg at 10/29/24 9182   melatonin tablet 6 mg  6 mg Oral QHS PRN Shona Laurence N, DO   6 mg at 10/26/24 0013   phenol (CHLORASEPTIC) mouth spray 1 spray  1 spray Mouth/Throat PRN Adefeso, Oladapo, DO   1 spray at 10/23/24 0831   polyethylene glycol (MIRALAX  / GLYCOLAX ) packet 17 g  17 g Oral Daily PRN Shona Laurence SAILOR, DO   17 g at 10/29/24 0021   prochlorperazine  (COMPAZINE ) injection 5 mg  5 mg Intravenous Q6H PRN Shona Laurence N, DO       vitamin B-12 (CYANOCOBALAMIN ) tablet 500 mcg  500 mcg Oral Daily Tat, Alm, MD   500 mcg at 10/29/24 9182     Discharge Medications: Please see discharge summary for a list of discharge medications.  Relevant Imaging Results:  Relevant Lab Results:   Additional Information SSN: [id number]  Lucie Lunger, LCSWA

## 2024-11-29 NOTE — Progress Notes (Unsigned)
 " Cardiology Office Note:  .   Date:  11/30/2024  ID:  Madison Davidson, DOB [birth year], MRN [id number] PCP: System, Provider Not In  [facility] Providers Cardiologist:  Diannah SHAUNNA Maywood, MD {  History of Present Illness: .   Madison Davidson is a 74 y.o. female with PMHx of paroxysmal atrial fibrillation, DM2, HTN and Hx of right MCA stroke s/p stent in 2022, who reports to [facility] office for follow up.   Pertinent cardiac medical history:  [facility] with Dr. Hester.  Normal exercise Myoview in 2019 & 2021 via Care Everywhere with [facility]. Paroxysmal atrial fibrillation New onset during 05/2022 hospitalization in the setting of stress related to recent loss of her son.  Converted back to NSR on amiodarone . ECHO 05/2022: EF 60 to 65%, normal RV and LV function, trivial MV regurgitation Chronic MCA stroke s/p right distal MCA stent in 2022 Previously was on Plavix  but discontinued given need of OAC.  Last seen in heartcare 07/28/2024 by Dr. Mallipeddi for new patient evaluation of paroxysmal atrial fibrillation.  Patient was asymptomatic at that time.  Noted 2 falls in 06/2024.  Noted that amiodarone  expired and did not refill.  Continued on Eliquis , metoprolol , lisinopril .  Seen via consult by heartcare 12/1-02/2024 to assist with rhythm management for metabolic encephalopathy in the setting of E. coli UTI. Developed Afib with RVR during hospitalization. No improvement with Diltiazem . Converted to NSR on IV amiodarone .  Discontinued diltiazem .  Discharged on amiodarone  200 mg twice daily, Eliquis  5 mg twice daily, Lipitor  40 mg daily, lisinopril  20 mg daily.  Today, reports occasional brief nose bleeds at night that only requires 1 kleenex tissue to stop. Also reports chronic ongoing bilateral leg pain that has been present for years. She never felt that she was in afib and did not notice a difference after DCCV. Denies chest pain, shortness of breath,  palpitations, syncope, presyncope, dizziness, orthopnea, PND, swelling or significant weight changes, significant acute bleeding, or claudication. Reports compliance with medications. She is not very active. She lives in [facility]. He uses a wheelchair to get around facility but she is able to walk in her room without any concerns. She does not do any rehab with facility.   ROS: 10 point review of system has been reviewed and considered negative except ones been listed in the HPI.   Studies Reviewed: SABRA    ECHO IMPRESSIONS 05/2022  1. Left ventricular ejection fraction, by estimation, is 60 to 65%. The  left ventricle has normal function. The left ventricle has no regional  wall motion abnormalities. Left ventricular diastolic parameters were  normal.   2. Right ventricular systolic function is normal. The right ventricular  size is normal.   3. The mitral valve is normal in structure. Trivial mitral valve  regurgitation.   4. The aortic valve is normal in structure. Aortic valve regurgitation is  not visualized.   CV Studies: Cardiac studies reviewed are outlined and summarized above. Otherwise please see EMR for full report.   Risk Assessment/Calculations:    CHA2DS2-VASc Score = 7  This indicates a 11.2% annual risk of stroke. The patient's score is based upon: CHF History: 0 HTN History: 1 Diabetes History: 1 Stroke History: 2 Vascular Disease History: 0 Age Score: 2 Gender Score: 1      Physical Exam:   VS:  BP 112/64   Pulse 66   Ht 5' 2 (1.575 m)   SpO2  98%   BMI 24.76 kg/m    Wt Readings from Last 3 Encounters:  10/21/24 135 lb 5.8 oz (61.4 kg)  07/28/24 145 lb 3.2 oz (65.9 kg)  05/16/24 185 lb (83.9 kg)    GEN: Well nourished, well developed in no acute distress while sitting in wheelchair. Accompanied by staff from facility.  NECK: No JVD; No carotid bruits CARDIAC: RRR, no murmurs, rubs, gallops RESPIRATORY:  Clear to auscultation without  rales, wheezing or rhonchi  ABDOMEN: Soft, non-tender, non-distended EXTREMITIES:  No edema; No deformity; legs braces on bilaterallly  ASSESSMENT AND PLAN: .    PAF (paroxysmal atrial fibrillation) (HCC) Reviewed EKG 10/28/2024: NSR with sinus arrhythmia On exam noted regular, rate and rhythm.  No need to repeat EKG.  Reports very brief nosebleeds at night occasionally. Reviewed facility labs from 11/23/2024: CBC with Hgb stable at 9.4.  Denies palpitations, dizziness, or chest discomfort. No recurrent dark stools or significant bleeding.  Continue on amiodarone  200 mg twice daily and Eliquis  5 mg twice daily. (dose appropriate for age 24, weight 61 kg, Cr WNL in 10/2024)   Primary hypertension BP this OV well controlled today: 112/64 Reviewed facility labs from 11/23/2024: K, Cr WNL  Continue on lisinopril  20 mg daily Encourage physical activity for 150 minutes per week and heart healthy low sodium diet. Discussed limiting sodium intake to < 2 grams daily.     Hyperlipidemia LDL goal <70 Reviewed facility labs from 11/23/2024: LDL 69. LDL at goal.  Continue Lipitor  40 mg daily.  Hisory of CVA (cerebrovascular accident) Previously was on Plavix  but discontinued given need of OAC.    Dispo: Follow up in 3 months with [facility] or any APP.   Signed, Lorette CINDERELLA Kapur, PA-C  "

## 2024-11-30 ENCOUNTER — Encounter: Payer: Self-pay | Admitting: Internal Medicine

## 2024-11-30 ENCOUNTER — Ambulatory Visit: Attending: Physician Assistant | Admitting: Physician Assistant

## 2024-11-30 VITALS — BP 112/64 | HR 66 | Ht 62.0 in

## 2024-11-30 DIAGNOSIS — E785 Hyperlipidemia, unspecified: Secondary | ICD-10-CM | POA: Diagnosis not present

## 2024-11-30 DIAGNOSIS — Z8673 Personal history of transient ischemic attack (TIA), and cerebral infarction without residual deficits: Secondary | ICD-10-CM | POA: Insufficient documentation

## 2024-11-30 DIAGNOSIS — I480 Paroxysmal atrial fibrillation: Secondary | ICD-10-CM | POA: Diagnosis not present

## 2024-11-30 DIAGNOSIS — I10 Essential (primary) hypertension: Secondary | ICD-10-CM | POA: Diagnosis not present

## 2024-11-30 NOTE — Patient Instructions (Signed)
 Medication Instructions:  Your physician recommends that you continue on your current medications as directed. Please refer to the Current Medication list given to you today.  *If you need a refill on your cardiac medications before your next appointment, please call your pharmacy*  Lab Work: None ordered If you have labs (blood work) drawn today and your tests are completely normal, you will receive your results only by: MyChart Message (if you have MyChart) OR A paper copy in the mail If you have any lab test that is abnormal or we need to change your treatment, we will call you to review the results.  Follow-Up: At [facility], you and your health needs are our priority.  As part of our continuing mission to provide you with exceptional heart care, our providers are all part of one team.  This team includes your primary Cardiologist (physician) and Advanced Practice Providers or APPs (Physician Assistants and Nurse Practitioners) who all work together to provide you with the care you need, when you need it.  Your next appointment:   3 month(s)  Provider:   You may see Vishnu P Mallipeddi, MD or one of the following Advanced Practice Providers on your designated Care Team:   Brittany Strader, PA-C  [place], [place] Olivia [place], [place]   We recommend signing up for the patient portal called MyChart.  Sign up information is provided on this After Visit Summary.  MyChart is used to connect with patients for Virtual Visits (Telemedicine).  Patients are able to view lab/test results, encounter notes, upcoming appointments, etc.  Non-urgent messages can be sent to your provider as well.   To learn more about what you can do with MyChart, go to [email or web address].

## 2024-12-01 ENCOUNTER — Encounter: Payer: Self-pay | Admitting: Physician Assistant

## 2024-12-09 ENCOUNTER — Other Ambulatory Visit: Payer: Self-pay | Admitting: Vascular Surgery

## 2024-12-09 DIAGNOSIS — L97909 Non-pressure chronic ulcer of unspecified part of unspecified lower leg with unspecified severity: Secondary | ICD-10-CM

## 2024-12-22 ENCOUNTER — Other Ambulatory Visit: Payer: Self-pay

## 2024-12-22 ENCOUNTER — Emergency Department

## 2024-12-22 ENCOUNTER — Observation Stay

## 2024-12-22 ENCOUNTER — Inpatient Hospital Stay
Admission: EM | Admit: 2024-12-22 | Discharge: 2024-12-31 | Disposition: A | Attending: Internal Medicine | Admitting: Internal Medicine

## 2024-12-22 DIAGNOSIS — L97529 Non-pressure chronic ulcer of other part of left foot with unspecified severity: Principal | ICD-10-CM | POA: Diagnosis present

## 2024-12-22 DIAGNOSIS — I739 Peripheral vascular disease, unspecified: Secondary | ICD-10-CM

## 2024-12-22 DIAGNOSIS — L97524 Non-pressure chronic ulcer of other part of left foot with necrosis of bone: Secondary | ICD-10-CM | POA: Insufficient documentation

## 2024-12-22 LAB — GLUCOSE, CAPILLARY
Glucose-Capillary: 58 mg/dL — ABNORMAL LOW (ref 70–99)
Glucose-Capillary: 60 mg/dL — ABNORMAL LOW (ref 70–99)
Glucose-Capillary: 172 mg/dL — ABNORMAL HIGH (ref 70–99)

## 2024-12-22 LAB — BASIC METABOLIC PANEL WITH GFR
Anion gap: 16 — ABNORMAL HIGH (ref 5–15)
BUN: 32 mg/dL — ABNORMAL HIGH (ref 8–23)
CO2: 21 mmol/L — ABNORMAL LOW (ref 22–32)
Calcium: 7.8 mg/dL — ABNORMAL LOW (ref 8.9–10.3)
Chloride: 107 mmol/L (ref 98–111)
Creatinine, Ser: 1.51 mg/dL — ABNORMAL HIGH (ref 0.44–1.00)
GFR, Estimated: 36 mL/min — ABNORMAL LOW
Glucose, Bld: 91 mg/dL (ref 70–99)
Potassium: 5.3 mmol/L — ABNORMAL HIGH (ref 3.5–5.1)
Sodium: 143 mmol/L (ref 135–145)

## 2024-12-22 LAB — CBC
HCT: 30 % — ABNORMAL LOW (ref 36.0–46.0)
Hemoglobin: 9.3 g/dL — ABNORMAL LOW (ref 12.0–15.0)
MCH: 27 pg (ref 26.0–34.0)
MCHC: 31 g/dL (ref 30.0–36.0)
MCV: 87.2 fL (ref 80.0–100.0)
Platelets: 269 10*3/uL (ref 150–400)
RBC: 3.44 MIL/uL — ABNORMAL LOW (ref 3.87–5.11)
RDW: 15.6 % — ABNORMAL HIGH (ref 11.5–15.5)
WBC: 11 10*3/uL — ABNORMAL HIGH (ref 4.0–10.5)
nRBC: 0 % (ref 0.0–0.2)

## 2024-12-22 LAB — MRSA NEXT GEN BY PCR, NASAL: MRSA by PCR Next Gen: NOT DETECTED

## 2024-12-22 LAB — SEDIMENTATION RATE: Sed Rate: 42 mm/h — ABNORMAL HIGH (ref 0–30)

## 2024-12-22 LAB — LACTIC ACID, PLASMA: Lactic Acid, Venous: 4.3 mmol/L (ref 0.5–1.9)

## 2024-12-22 LAB — C-REACTIVE PROTEIN: CRP: 0.8 mg/dL

## 2024-12-22 MED ORDER — SODIUM CHLORIDE 0.9 % IV BOLUS
1000.0000 mL | Freq: Once | INTRAVENOUS | Status: AC
  Administered 2024-12-22: 1000 mL via INTRAVENOUS

## 2024-12-22 MED ORDER — LEVOTHYROXINE SODIUM 50 MCG PO TABS
75.0000 ug | ORAL_TABLET | ORAL | Status: DC
  Administered 2024-12-23 – 2024-12-31 (×5): 75 ug via ORAL
  Filled 2024-12-22 (×5): qty 1

## 2024-12-22 MED ORDER — DEXTROSE-SODIUM CHLORIDE 5-0.9 % IV SOLN: INTRAVENOUS | Status: AC

## 2024-12-22 MED ORDER — FAMOTIDINE 20 MG PO TABS
20.0000 mg | ORAL_TABLET | Freq: Every day | ORAL | Status: DC
  Administered 2024-12-22 – 2024-12-30 (×9): 20 mg via ORAL
  Filled 2024-12-22 (×9): qty 1

## 2024-12-22 MED ORDER — MIRTAZAPINE 15 MG PO TABS
7.5000 mg | ORAL_TABLET | Freq: Every day | ORAL | Status: DC
  Administered 2024-12-22 – 2024-12-30 (×9): 7.5 mg via ORAL
  Filled 2024-12-22 (×9): qty 1

## 2024-12-22 MED ORDER — MELATONIN 5 MG PO TABS: 5.0000 mg | ORAL_TABLET | Freq: Every evening | ORAL | Status: DC | PRN

## 2024-12-22 MED ORDER — DIVALPROEX SODIUM 125 MG PO DR TAB
125.0000 mg | DELAYED_RELEASE_TABLET | Freq: Two times a day (BID) | ORAL | Status: DC
  Administered 2024-12-22 – 2024-12-31 (×17): 125 mg via ORAL
  Filled 2024-12-22 (×18): qty 1

## 2024-12-22 MED ORDER — HEPARIN (PORCINE) 25000 UT/250ML-% IV SOLN
800.0000 [IU]/h | INTRAVENOUS | Status: DC
  Administered 2024-12-22: 800 [IU]/h via INTRAVENOUS
  Administered 2024-12-24: 650 [IU]/h via INTRAVENOUS
  Filled 2024-12-22 (×2): qty 250

## 2024-12-22 MED ORDER — PANTOPRAZOLE SODIUM 40 MG PO TBEC
40.0000 mg | DELAYED_RELEASE_TABLET | Freq: Every day | ORAL | Status: DC
  Administered 2024-12-22 – 2024-12-31 (×9): 40 mg via ORAL
  Filled 2024-12-22 (×9): qty 1

## 2024-12-22 MED ORDER — ONDANSETRON HCL 4 MG/2ML IJ SOLN
4.0000 mg | Freq: Four times a day (QID) | INTRAMUSCULAR | Status: DC | PRN
  Administered 2024-12-30: 4 mg via INTRAVENOUS
  Filled 2024-12-22: qty 2

## 2024-12-22 MED ORDER — SODIUM CHLORIDE 0.9 % IV SOLN: INTRAVENOUS | Status: DC

## 2024-12-22 MED ORDER — ATORVASTATIN CALCIUM 20 MG PO TABS
40.0000 mg | ORAL_TABLET | Freq: Every day | ORAL | Status: DC
  Administered 2024-12-22 – 2024-12-30 (×9): 40 mg via ORAL
  Filled 2024-12-22 (×9): qty 2

## 2024-12-22 MED ORDER — SODIUM CHLORIDE 0.9 % IV BOLUS
1000.0000 mL | Freq: Once | INTRAVENOUS | Status: AC
  Administered 2024-12-23: 1000 mL via INTRAVENOUS

## 2024-12-22 MED ORDER — LORAZEPAM 2 MG/ML IJ SOLN: 0.5000 mg | Freq: Four times a day (QID) | INTRAMUSCULAR | Status: DC | PRN

## 2024-12-22 MED ORDER — MORPHINE SULFATE (PF) 2 MG/ML IV SOLN
2.0000 mg | Freq: Once | INTRAVENOUS | Status: AC
  Administered 2024-12-22: 2 mg via INTRAVENOUS
  Filled 2024-12-22: qty 1

## 2024-12-22 MED ORDER — AMIODARONE HCL 200 MG PO TABS
200.0000 mg | ORAL_TABLET | Freq: Two times a day (BID) | ORAL | Status: DC
  Administered 2024-12-22 – 2024-12-31 (×17): 200 mg via ORAL
  Filled 2024-12-22 (×17): qty 1

## 2024-12-22 MED ORDER — DEXTROSE 50 % IV SOLN
1.0000 | Freq: Once | INTRAVENOUS | Status: AC
  Administered 2024-12-22: 50 mL via INTRAVENOUS
  Filled 2024-12-22: qty 50

## 2024-12-22 MED ORDER — PIPERACILLIN-TAZOBACTAM 3.375 G IVPB
3.3750 g | Freq: Three times a day (TID) | INTRAVENOUS | Status: DC
  Administered 2024-12-22 – 2024-12-30 (×21): 3.375 g via INTRAVENOUS
  Filled 2024-12-22 (×22): qty 50

## 2024-12-22 MED ORDER — ONDANSETRON HCL 4 MG PO TABS: 4.0000 mg | ORAL_TABLET | Freq: Four times a day (QID) | ORAL | Status: DC | PRN

## 2024-12-22 MED ORDER — HEPARIN SODIUM (PORCINE) 5000 UNIT/ML IJ SOLN: 5000.0000 [IU] | Freq: Two times a day (BID) | INTRAMUSCULAR | Status: DC

## 2024-12-22 MED ORDER — VANCOMYCIN HCL 1250 MG/250ML IV SOLN
1250.0000 mg | Freq: Once | INTRAVENOUS | Status: DC
  Filled 2024-12-22: qty 250

## 2024-12-22 MED ORDER — INSULIN ASPART 100 UNIT/ML IJ SOLN
0.0000 [IU] | Freq: Three times a day (TID) | INTRAMUSCULAR | Status: DC
  Administered 2024-12-23: 2 [IU] via SUBCUTANEOUS
  Administered 2024-12-23: 1 [IU] via SUBCUTANEOUS
  Administered 2024-12-25: 3 [IU] via SUBCUTANEOUS
  Administered 2024-12-25 – 2024-12-26 (×2): 2 [IU] via SUBCUTANEOUS
  Administered 2024-12-27 – 2024-12-29 (×3): 1 [IU] via SUBCUTANEOUS
  Administered 2024-12-30: 2 [IU] via SUBCUTANEOUS
  Administered 2024-12-30: 1 [IU] via SUBCUTANEOUS
  Filled 2024-12-22: qty 2
  Filled 2024-12-22: qty 3
  Filled 2024-12-22 (×2): qty 2
  Filled 2024-12-22: qty 1
  Filled 2024-12-22 (×2): qty 2
  Filled 2024-12-22 (×2): qty 1

## 2024-12-22 MED ORDER — COLLAGENASE 250 UNIT/GM EX OINT
1.0000 | TOPICAL_OINTMENT | Freq: Every day | CUTANEOUS | Status: DC
  Administered 2024-12-23 – 2024-12-26 (×2): 1 via TOPICAL
  Filled 2024-12-22: qty 30

## 2024-12-22 MED ORDER — PIPERACILLIN-TAZOBACTAM 3.375 G IVPB 30 MIN
3.3750 g | Freq: Once | INTRAVENOUS | Status: AC
  Administered 2024-12-22: 3.375 g via INTRAVENOUS
  Filled 2024-12-22: qty 50

## 2024-12-22 MED ORDER — HYDRALAZINE HCL 20 MG/ML IJ SOLN
5.0000 mg | Freq: Four times a day (QID) | INTRAMUSCULAR | Status: DC | PRN
  Administered 2024-12-29 – 2024-12-31 (×2): 5 mg via INTRAVENOUS
  Filled 2024-12-22 (×5): qty 1

## 2024-12-22 MED ORDER — LISINOPRIL 20 MG PO TABS: 20.0000 mg | ORAL_TABLET | Freq: Every day | ORAL | Status: DC

## 2024-12-22 MED ORDER — ACETAMINOPHEN 650 MG RE SUPP: 650.0000 mg | Freq: Four times a day (QID) | RECTAL | Status: DC | PRN

## 2024-12-22 MED ORDER — HYDROMORPHONE HCL 1 MG/ML IJ SOLN
0.5000 mg | INTRAMUSCULAR | Status: DC | PRN
  Administered 2024-12-25 (×2): 1 mg via INTRAVENOUS
  Filled 2024-12-22 (×2): qty 1

## 2024-12-22 MED ORDER — LEVOTHYROXINE SODIUM 50 MCG PO TABS
50.0000 ug | ORAL_TABLET | ORAL | Status: DC
  Administered 2024-12-22 – 2024-12-30 (×4): 50 ug via ORAL
  Filled 2024-12-22 (×5): qty 1

## 2024-12-22 MED ORDER — ACETAMINOPHEN 325 MG PO TABS
650.0000 mg | ORAL_TABLET | Freq: Four times a day (QID) | ORAL | Status: DC | PRN
  Administered 2024-12-26 – 2024-12-31 (×4): 650 mg via ORAL
  Filled 2024-12-22 (×4): qty 2

## 2024-12-22 MED ORDER — POLYETHYLENE GLYCOL 3350 17 G PO PACK
17.0000 g | PACK | Freq: Every day | ORAL | Status: DC | PRN
  Administered 2024-12-23: 17 g via ORAL
  Filled 2024-12-22: qty 1

## 2024-12-22 NOTE — ED Notes (Signed)
 Phlebotomy called for collection

## 2024-12-22 NOTE — ED Triage Notes (Signed)
 Pt daughter sts that pt has a wound on her foot that has been on going since November. Pt daughter sts that pt foot is now swelling.

## 2024-12-22 NOTE — ED Triage Notes (Signed)
 First nurse note: pt to ED [facility] from home for pressure sore to left heel since November. Reports was in rehab center but got worse. VSS with EMS

## 2024-12-22 NOTE — Consult Note (Signed)
 PODIATRY / FOOT AND ANKLE SURGERY CONSULTATION NOTE  Requesting Physician: Dr. Laurita  Reason for consult: Left heel ulcer  Chief Complaint: Left heel pain/sore   HPI: Madison Davidson is a 74 y.o. female who presents with a left posterior heel ulceration.  Patient had this ulceration apparently around Thanksgiving time while in facility.  Patient's ambulation significantly was affected at that time and she became wheelchair/bedbound which has been for about the past 6 months or so now.  Patient states that left heel wound began a few months ago and has a blood blister and at some point ruptured and developed an ulcer/callus on top of the area.  Patient's daughter went to see the patient over the weekend and she was noted to have severe pain to the left heel with swelling with concerns for infection.  Patient did see a vascular surgeon in [place] who ordered ABIs but testing was not performed.  Patient's oral intake has decreased significantly since the weekend and patient was very dehydrated so she came into the emergency room.  Patient had x-ray as well as MRI imaging performed which did not show any significant signs of osteomyelitis.  ABIs that were performed in hospital showed mild PVD and vascular has been consulted.  Podiatry was consulted for further evaluation of the heel wound.  PMHx:  Past Medical History:  Diagnosis Date   Cancer (HCC)    skin   Coronary artery disease    Diabetes mellitus without complication (HCC)    Hypertension    Stroke [facility])     Surgical Hx:  Past Surgical History:  Procedure Laterality Date   ABDOMINAL HYSTERECTOMY     IR ANGIO INTRA EXTRACRAN SEL INTERNAL CAROTID BILAT MOD SED  05/11/2021   IR ANGIO VERTEBRAL SEL VERTEBRAL UNI R MOD SED  05/11/2021   IR ANGIOGRAM EXTREMITY LEFT  05/11/2021   IR CT HEAD LTD  05/11/2021   IR INTRA CRAN STENT  05/11/2021   IR INTRA CRAN STENT  05/12/2021   IR US  GUIDE VASC ACCESS RIGHT  05/11/2021   RADIOLOGY WITH  ANESTHESIA N/A 05/11/2021   Procedure: IR WITH ANESTHESIA - INTRACRANIAL STENT;  Surgeon: de Macedo Rodrigues, Katyucia, MD;  Location: [facility] OR;  Service: Radiology;  Laterality: N/A;    FHx:  Family History  Problem Relation Age of Onset   Heart disease Mother    Heart disease Father     Social History:  reports that she has never smoked. She has never used smokeless tobacco. She reports that she does not drink alcohol and does not use drugs.  Allergies: Allergies[1]  Medications Prior to Admission  Medication Sig Dispense Refill   acetaminophen  (TYLENOL ) 325 MG tablet Take 2 tablets (650 mg total) by mouth every 6 (six) hours as needed for mild pain, fever or headache.     amiodarone  (PACERONE ) 200 MG tablet Take 1 tablet (200 mg total) by mouth 2 (two) times daily.     apixaban  (ELIQUIS ) 5 MG TABS tablet Take 1 tablet (5 mg total) by mouth 2 (two) times daily. 60 tablet 3   atorvastatin  (LIPITOR ) 40 MG tablet Take 1 tablet (40 mg total) by mouth daily.     divalproex  (DEPAKOTE ) 125 MG DR tablet Take 125 mg by mouth 2 (two) times daily.     famotidine  (PEPCID ) 20 MG tablet Take 20 mg by mouth at bedtime.     levothyroxine  (SYNTHROID ) 50 MCG tablet Take 50 mcg by mouth every other day.  levothyroxine  (SYNTHROID ) 75 MCG tablet Take 75 mcg by mouth every other day.     lisinopril  (ZESTRIL ) 20 MG tablet Take 1 tablet (20 mg total) by mouth daily. 30 tablet 0   melatonin 3 MG TABS tablet Take 2 tablets (6 mg total) by mouth at bedtime as needed.     metFORMIN  (GLUCOPHAGE ) 1000 MG tablet Take 1 tablet (1,000 mg total) by mouth 2 (two) times daily with a meal. 60 tablet 0   mirtazapine  (REMERON ) 7.5 MG tablet Take 7.5 mg by mouth at bedtime.     omeprazole (PRILOSEC) 40 MG capsule Take 40 mg by mouth daily.     ondansetron  (ZOFRAN ) 4 MG tablet Take 4 mg by mouth every 6 (six) hours as needed for nausea or vomiting.     polyethylene glycol (MIRALAX  / GLYCOLAX ) 17 g packet Take 17 g by mouth  daily as needed for mild constipation.     cholecalciferol (VITAMIN D3) 25 MCG (1000 UNIT) tablet Take 1,000 Units by mouth daily. (Patient not taking: Reported on 12/22/2024)     ferrous sulfate  325 (65 FE) MG tablet Take 325 mg by mouth every Monday, Wednesday, and Friday. (Patient not taking: Reported on 12/22/2024)     folic acid  (FOLVITE ) 1 MG tablet Take 1 tablet (1 mg total) by mouth daily. (Patient not taking: Reported on 12/22/2024)     vitamin B-12 (VITAMIN B12) 500 MCG tablet Take 1 tablet (500 mcg total) by mouth daily. (Patient not taking: Reported on 12/22/2024)      Physical Exam: General: Alert and oriented.  No apparent distress.  Vascular: DP/PT pulses +1 bilateral, capillary fill time appears to be intact to digits, no hair growth present to bilateral lower extremities.  Neuro: Light touch sensation intact bilateral lower extremities.  Derm: Stable eschar present to the posterior aspect left heel with minimal surrounding erythema or edema, no significant drainage, appears to be very dry.    Small stable eschar present to the distal tip of the right hallux.    MSK: Pain on palpation left posterior heel.  Results for orders placed or performed during the hospital encounter of 12/22/24 (from the past 48 hours)  CBC     Status: Abnormal   Collection Time: 12/22/24 11:39 AM  Result Value Ref Range   WBC 11.0 (H) 4.0 - 10.5 K/uL   RBC 3.44 (L) 3.87 - 5.11 MIL/uL   Hemoglobin 9.3 (L) 12.0 - 15.0 g/dL   HCT 69.9 (L) 63.9 - 53.9 %   MCV 87.2 80.0 - 100.0 fL   MCH 27.0 26.0 - 34.0 pg   MCHC 31.0 30.0 - 36.0 g/dL   RDW 84.3 (H) 88.4 - 84.4 %   Platelets 269 150 - 400 K/uL   nRBC 0.0 0.0 - 0.2 %    Comment: Performed at [facility], [address] [place], [place] [address]  Basic metabolic panel     Status: Abnormal   Collection Time: 12/22/24 11:39 AM  Result Value Ref Range   Sodium 143 135 - 145 mmol/L   Potassium 5.3 (H) 3.5 - 5.1 mmol/L   Chloride 107  98 - 111 mmol/L   CO2 21 (L) 22 - 32 mmol/L   Glucose, Bld 91 70 - 99 mg/dL    Comment: Glucose reference range applies only to samples taken after fasting for at least 8 hours.   BUN 32 (H) 8 - 23 mg/dL   Creatinine, Ser 8.48 (H) 0.44 - 1.00 mg/dL  Calcium  7.8 (L) 8.9 - 10.3 mg/dL   GFR, Estimated 36 (L) >60 mL/min    Comment: (NOTE) Calculated using the CKD-EPI Creatinine Equation (2021)    Anion gap 16 (H) 5 - 15    Comment: Performed at [facility], [address] [place], [place] [address]  MRSA Next Gen by PCR, Nasal     Status: None   Collection Time: 12/22/24  2:02 PM   Specimen: Nasal Mucosa; Nasal Swab  Result Value Ref Range   MRSA by PCR Next Gen NOT DETECTED NOT DETECTED    Comment: (NOTE) The GeneXpert MRSA Assay (FDA approved for NASAL specimens only), is one component of a comprehensive MRSA colonization surveillance program. It is not intended to diagnose MRSA infection nor to guide or monitor treatment for MRSA infections. Test performance is not FDA approved in patients less than 64 years old. Performed at [facility], [address] [place], [place] [address]   Lactic acid, plasma     Status: Abnormal   Collection Time: 12/22/24  2:02 PM  Result Value Ref Range   Lactic Acid, Venous 4.3 (HH) 0.5 - 1.9 mmol/L    Comment: Critical Value, Read Back and verified with [facility] 12/22/24 1437 MW Performed at [facility], [address] [place], [place] [address]   Glucose, capillary     Status: Abnormal   Collection Time: 12/22/24  5:04 PM  Result Value Ref Range   Glucose-Capillary 58 (L) 70 - 99 mg/dL    Comment: Glucose reference range applies only to samples taken after fasting for at least 8 hours.  Glucose, capillary     Status: Abnormal   Collection Time: 12/22/24  5:33 PM  Result Value Ref Range   Glucose-Capillary 60 (L) 70 - 99 mg/dL    Comment: Glucose reference range applies only to samples taken after  fasting for at least 8 hours.  Sedimentation rate     Status: Abnormal   Collection Time: 12/22/24  6:25 PM  Result Value Ref Range   Sed Rate 42 (H) 0 - 30 mm/hr    Comment: Performed at [facility], [address] [place], [place] [address]   US  ARTERIAL ABI (SCREENING LOWER EXTREMITY) Result Date: 12/22/2024 CLINICAL DATA:  Foot ulcer EXAM: NONINVASIVE PHYSIOLOGIC VASCULAR STUDY OF BILATERAL LOWER EXTREMITIES TECHNIQUE: Evaluation of both lower extremities were performed at rest, including calculation of ankle-brachial indices with single level pressure measurements and doppler recording. COMPARISON:  None available. FINDINGS: Right ABI:  0.90 Left ABI:  0.81 Right Lower Extremity:  Normal arterial waveforms at the ankle. Left Lower Extremity:  Normal arterial waveforms at the ankle. 0.8-0.89 Mild PAD IMPRESSION: Decreased ankle-brachial indices consistent with mild BILATERAL lower extremity peripheral arterial disease. Electronically Signed   By: Aliene Lloyd M.D.   On: 12/22/2024 15:11   MR ANKLE LEFT WO CONTRAST Result Date: 12/22/2024 EXAM: MRI of the left Ankle without contrast. 12/22/2024 02:16:53 PM TECHNIQUE: Multiplanar multisequence MRI of the left ankle was performed without the administration of intravenous contrast. COMPARISON: None available. CLINICAL HISTORY: Heal wound, possible osteomyelitis. FINDINGS: SYNDESMOTIC LIGAMENTS: Intact anterior inferior tibiofibular ligament and posterior inferior tibiofibular ligament. No significant periligamentous edema or interval widening. LATERAL COLLATERAL LIGAMENT COMPLEX: Intact anterior talofibular ligament, posterior talofibular ligament and calcaneofibular ligament. DELTOID LIGAMENT COMPLEX: Intact superficial and deep components. SINUS TARSI AND SPRING LIGAMENT: Visualized portions of the spring ligament are intact. Normal appearance of the sinus tarsi. MEDIAL TENDONS: Distal tibialis posterior  tenosynovitis. Mild flexor hallucis  longus tenosynovitis proximal to the knot of henry. Intact flexor digitorum longus tendon. LATERAL TENDONS: Longitudinal split tear of the peroneus brevis tendon adjacent to the lateral malleolus. Intact peroneus longus tendon. ANTERIOR TENDONS: The tibialis anterior, extensor hallucis longus and extensor digitorum longus tendons are normal in position, morphology and signal. ACHILLES TENDON: Mild distal achilles tendinopathy with faintly increased internal T2 signal and anterior convexity of the tendon. No associated bursitis. PLANTAR FASCIA: The medial and lateral bundles of the plantar fascia are normal in morphology and signal. There is no acute plantar fasciitis or tear. No plantar fascial nodules. TARSAL TUNNEL: There are no obstructing lesions within the tarsal tunnel. BONE MARROW: Trace endosteal edema along the adjacent calcaneus with demineralized cortex on image 24 series 4, suspicious for early calcaneal osteomyelitis. No acute fracture. No aggressive marrow replacing lesion. JOINT SPACES: No significant joint effusion. No significant degenerative changes. Normal alignment. SOFT TISSUES: Posterior heel wound behind the calcaneus. Subcutaneous edema circumferentially at the ankle (especially laterally) and tracking into the dorsum of the foot, cellulitis is not excluded. IMPRESSION: 1. Posterior heel wound with adjacent calcaneal cortical demineralization and trace endosteal edema, suspicious for early calcaneal osteomyelitis. 2. Circumferential ankle and dorsal foot subcutaneous edema, cellulitis not excluded. 3. Longitudinal split tear of the peroneus brevis tendon adjacent to the lateral malleolus. 4. Distal tibialis posterior tenosynovitis. 5. Mild flexor hallucis longus tenosynovitis proximal to the knot of Henry. 6. Mild distal Achilles tendinopathy. Electronically signed by: Ryan Salvage MD 12/22/2024 02:45 PM EST RP Workstation: [id number]   DG Foot Complete Left Result Date:  12/22/2024 CLINICAL DATA:  eval signs of osteo. ulcer on heel EXAM: LEFT FOOT - COMPLETE 3+ VIEW COMPARISON:  None Available. FINDINGS: There is no evidence of fracture or dislocation. Diffuse, patchy osseous demineralization. No focal demineralization, erosion or periosteal change. There is no evidence of arthropathy or other focal bone abnormality. Soft tissues are unremarkable. IMPRESSION: 1. No acute displaced fracture, dislocation or radiographic findings of osteomyelitis within the LEFT foot. 2. Diffuse osseous demineralization, correlate for osteopenia. Electronically Signed   By: Thom Hall M.D.   On: 12/22/2024 12:49   US  Venous Img Lower Unilateral Left Result Date: 12/22/2024 CLINICAL DATA:  Left lower extremity pain and swelling EXAM: LEFT LOWER EXTREMITY VENOUS DOPPLER ULTRASOUND TECHNIQUE: Gray-scale sonography with compression, as well as color and duplex ultrasound, were performed to evaluate the deep venous system(s) from the level of the common femoral vein through the popliteal and proximal calf veins. COMPARISON:  None Available. FINDINGS: VENOUS Normal compressibility of the common femoral, superficial femoral, and popliteal veins, as well as the visualized calf veins. Visualized portions of profunda femoral vein and great saphenous vein unremarkable. No filling defects to suggest DVT on grayscale or color Doppler imaging. Doppler waveforms show normal direction of venous flow, normal respiratory plasticity and response to augmentation. Limited views of the contralateral common femoral vein are unremarkable. OTHER None. Limitations: none IMPRESSION: Negative. Electronically Signed   By: Wilkie Lent M.D.   On: 12/22/2024 12:20    Blood pressure (!) 141/48, pulse 64, temperature 98 F (36.7 C), resp. rate 14, height 5' 1 (1.549 m), weight 54.4 kg, SpO2 100%.  Assessment Left posterior heel pressure ulceration, stable eschar Right hallux stable eschar  Plan - Patient seen and  examined - X-ray and MRI imaging reviewed.  Do not believe osteomyelitis present.  Very minimal inflammatory changes at the posterior heel which more so could be related to  Achilles spur that is present which is chronic pathology. - ESR/CRP ordered.  ESR slightly elevated at 42, would expect that to be higher in the setting of osteomyelitis of the calcaneus.  CRP pending. - Appreciate medicine recommendations for antibiotic therapy.  Would continue IV antibiotics for the next 1 to 2 days and then could potentially consider transitioning to broad-spectrum oral antibiotics such as Augmentin/doxycycline for at least a 7-day course. - Discussed treatment options for left heel ulceration as well as right hallux eschar.  Recommend applying Betadine paint daily to the right big toe. - To the left posterior heel discussed surgical debridement versus local wound care.  After discussion with patient and family they have elected for local wound care with enzymatic debridement.  Order placed for Santyl  to be placed on the wound daily with Mepilex foam border.  Prevalon boots ordered for bilateral lower extremities. - Discussed that if wound appears to be a draining more or has worsening signs of infection would recommend surgical debridement but for now appears to be very stable.  Patient does have a fair amount of sensation to her feet so debridement will likely be painful for her.  Recommend course for now symptomatic debridement and possible surgical debridement in the future. - ABIs reviewed.  Appears to have mild disease.  Vascular has been consulted for further assessment. - Will see patient again tomorrow and perform possibly some amount of bedside debridement of possible.  If appears to be worsening or labs come back for further concerns for infection could consider taking for surgical debridement and deep tissue cultures.  For now appears to be very stable.  Prentice Lee, DPM 12/22/2024, 8:02 PM          [1]  Allergies Allergen Reactions   Augmentin [Amoxicillin-Pot Clavulanate] Other (See Comments)    No reaction listed on MAR   Doxycycline Other (See Comments)    No reaction listed on MAR   Lisinopril  Other (See Comments)    No reaction listed on MAR   Norvasc [Amlodipine] Other (See Comments)    No reaction listed on [facility]

## 2024-12-22 NOTE — Progress Notes (Signed)
 PHARMACY - ANTICOAGULATION CONSULT NOTE  Pharmacy Consult for Heparin   Indication: atrial fibrillation  Allergies[1]  Patient Measurements: Height: 5' 1 (154.9 cm) Weight: 54.4 kg (120 lb) IBW/kg (Calculated) : 47.8 HEPARIN  DW (KG): 54.4  Vital Signs: Temp: 97.7 F (36.5 C) (01/27 2024) Temp Source: Oral (01/27 1441) BP: 135/44 (01/27 2024) Pulse Rate: 63 (01/27 2024)  Labs: Recent Labs    12/22/24 1139  HGB 9.3*  HCT 30.0*  PLT 269  CREATININE 1.51*    Estimated Creatinine Clearance: 24.3 mL/min (A) (by C-G formula based on SCr of 1.51 mg/dL (H)).   Medical History: Past Medical History:  Diagnosis Date   Cancer (HCC)    skin   Coronary artery disease    Diabetes mellitus without complication (HCC)    Hypertension    Stroke (HCC)     Medications:  Medications Prior to Admission  Medication Sig Dispense Refill Last Dose/Taking   acetaminophen  (TYLENOL ) 325 MG tablet Take 2 tablets (650 mg total) by mouth every 6 (six) hours as needed for mild pain, fever or headache.   Unknown   amiodarone  (PACERONE ) 200 MG tablet Take 1 tablet (200 mg total) by mouth 2 (two) times daily.   12/22/2024 Morning   apixaban  (ELIQUIS ) 5 MG TABS tablet Take 1 tablet (5 mg total) by mouth 2 (two) times daily. 60 tablet 3 12/22/2024 Morning   atorvastatin  (LIPITOR ) 40 MG tablet Take 1 tablet (40 mg total) by mouth daily.   12/21/2024 Bedtime   divalproex  (DEPAKOTE ) 125 MG DR tablet Take 125 mg by mouth 2 (two) times daily.   12/22/2024 Morning   famotidine  (PEPCID ) 20 MG tablet Take 20 mg by mouth at bedtime.   12/21/2024   levothyroxine  (SYNTHROID ) 50 MCG tablet Take 50 mcg by mouth every other day.   Past Week   levothyroxine  (SYNTHROID ) 75 MCG tablet Take 75 mcg by mouth every other day.   Past Week   lisinopril  (ZESTRIL ) 20 MG tablet Take 1 tablet (20 mg total) by mouth daily. 30 tablet 0 12/22/2024 Morning   melatonin 3 MG TABS tablet Take 2 tablets (6 mg total) by mouth at bedtime as  needed.   Unknown   metFORMIN  (GLUCOPHAGE ) 1000 MG tablet Take 1 tablet (1,000 mg total) by mouth 2 (two) times daily with a meal. 60 tablet 0 12/22/2024 Morning   mirtazapine  (REMERON ) 7.5 MG tablet Take 7.5 mg by mouth at bedtime.   12/21/2024   omeprazole (PRILOSEC) 40 MG capsule Take 40 mg by mouth daily.   Unknown   ondansetron  (ZOFRAN ) 4 MG tablet Take 4 mg by mouth every 6 (six) hours as needed for nausea or vomiting.   Unknown   polyethylene glycol (MIRALAX  / GLYCOLAX ) 17 g packet Take 17 g by mouth daily as needed for mild constipation.   Unknown   cholecalciferol (VITAMIN D3) 25 MCG (1000 UNIT) tablet Take 1,000 Units by mouth daily. (Patient not taking: Reported on 12/22/2024)   Not Taking   ferrous sulfate  325 (65 FE) MG tablet Take 325 mg by mouth every Monday, Wednesday, and Friday. (Patient not taking: Reported on 12/22/2024)   Not Taking   folic acid  (FOLVITE ) 1 MG tablet Take 1 tablet (1 mg total) by mouth daily. (Patient not taking: Reported on 12/22/2024)   Not Taking   vitamin B-12 (VITAMIN B12) 500 MCG tablet Take 1 tablet (500 mcg total) by mouth daily. (Patient not taking: Reported on 12/22/2024)   Not Taking    Assessment: Pharmacy  consulted to dose heparin  in this 74 year old female with Afib, possible upcoming surgery.   Pt was on Eliquis  5 mg PO BID PTA,  last dose on 1/27 AM.  CrCl = 24.3 ml/min   Goal of Therapy:  Heparin  level 0.3-0.7 units/ml aPTT 66 - 102  seconds Monitor platelets by anticoagulation protocol: Yes   Plan:  - Heparin  gtt to start @ 800 units/hr , no bolus due to recent Eliquis  - will use aPTT to guide dosing until correlating with HL - check aPTT and HL 8 hrs after start of drip - CBC daily   Madison Davidson D 12/22/2024,10:06 PM      [1]  Allergies Allergen Reactions   Augmentin [Amoxicillin-Pot Clavulanate] Other (See Comments)    No reaction listed on MAR   Doxycycline Other (See Comments)    No reaction listed on MAR   Lisinopril   Other (See Comments)    No reaction listed on MAR   Norvasc [Amlodipine] Other (See Comments)    No reaction listed on [facility]

## 2024-12-22 NOTE — H&P (Signed)
 " History and Physical    Madison Davidson FMW:[id number] DOB: [birth year] DOA: 12/22/2024  PCP: [facility], [facility] (Confirm with patient/family/NH records and if not entered, this has to be entered at [facility] point of entry) Patient coming from: SNF  I have personally briefly reviewed patient's old medical records in [facility]  Chief Complaint: Left foot pain and swelling  HPI: Madison Davidson is a 74 y.o. female with medical history significant of right MCA CVA status post stenting, PAF on Eliquis , HTN, CAD, HTN, hypothyroidism, presented with worsening of left heel wound and dehydration.  History was provided by daughter at bedside.  Patient has had intermittent left leg pain since sometime earlier last year, gradually her ambulation significantly affected and she has become wheelchair-bound for 6+ months.  2 months ago she developed left heel wound, began with a bloody blister, at some point it ruptured and patient started develop a ulcer with callus on top of it.  Daughter went to see patient on the weekend and noticed the patient developed severe pain and swelling of left heel which was new.  Patient was evaluated by vascular surgeon who ordered outpatient ABI study scheduled for next week.  In addition, daughter also noticed patient p.o. intake has significant decreased over the weekend and appeared to be very dehydrated today which prompted her to send patient to ED.  ED Course: Afebrile, nontachycardic blood pressure 128/51.  X-ray showed no signs of osteomyelitis of left foot, blood work showed WBC 11.0 hemoglobin 9.3 BUN 32 creatinine 1.5 compared to baseline less than 1.0, K5.3.  Patient was given IV bolus x 1 and started on vancomycin  and Zosyn  in the ED.  Review of Systems: As per HPI otherwise 14 point review of systems negative.    Past Medical History:  Diagnosis Date   Cancer (HCC)    skin   Coronary artery disease    Diabetes mellitus without complication  (HCC)    Hypertension    Stroke [facility])     Past Surgical History:  Procedure Laterality Date   ABDOMINAL HYSTERECTOMY     IR ANGIO INTRA EXTRACRAN SEL INTERNAL CAROTID BILAT MOD SED  05/11/2021   IR ANGIO VERTEBRAL SEL VERTEBRAL UNI R MOD SED  05/11/2021   IR ANGIOGRAM EXTREMITY LEFT  05/11/2021   IR CT HEAD LTD  05/11/2021   IR INTRA CRAN STENT  05/11/2021   IR INTRA CRAN STENT  05/12/2021   IR US  GUIDE VASC ACCESS RIGHT  05/11/2021   RADIOLOGY WITH ANESTHESIA N/A 05/11/2021   Procedure: IR WITH ANESTHESIA - INTRACRANIAL STENT;  Surgeon: de Macedo Rodrigues, Katyucia, MD;  Location: [facility] OR;  Service: Radiology;  Laterality: N/A;     reports that she has never smoked. She has never used smokeless tobacco. She reports that she does not drink alcohol and does not use drugs.  Allergies[1]  Family History  Problem Relation Age of Onset   Heart disease Mother    Heart disease Father      Prior to Admission medications  Medication Sig Start Date End Date Taking? Authorizing Provider  acetaminophen  (TYLENOL ) 325 MG tablet Take 2 tablets (650 mg total) by mouth every 6 (six) hours as needed for mild pain, fever or headache. 05/24/21   Angiulli, Toribio PARAS, PA-C  amiodarone  (PACERONE ) 200 MG tablet Take 1 tablet (200 mg total) by mouth 2 (two) times daily. 10/29/24   Bryn Bernardino NOVAK, MD  apixaban  (ELIQUIS ) 5 MG TABS tablet Take 1  tablet (5 mg total) by mouth 2 (two) times daily. 08/19/24 12/17/24  Mallipeddi, Vishnu P, MD  atorvastatin  (LIPITOR ) 40 MG tablet Take 1 tablet (40 mg total) by mouth daily. 07/28/24   Mallipeddi, Vishnu P, MD  cholecalciferol (VITAMIN D3) 25 MCG (1000 UNIT) tablet Take 1,000 Units by mouth daily.    [provider]  divalproex  (DEPAKOTE ) 125 MG DR tablet Take 125 mg by mouth 2 (two) times daily. 06/08/24   [provider]  famotidine  (PEPCID ) 20 MG tablet Take 20 mg by mouth at bedtime. 09/22/24   [provider]  ferrous sulfate  325 (65 FE) MG tablet  Take 325 mg by mouth every Monday, Wednesday, and Friday.    [provider]  folic acid  (FOLVITE ) 1 MG tablet Take 1 tablet (1 mg total) by mouth daily. 10/26/24   Evonnie Lenis, MD  levothyroxine  (SYNTHROID ) 50 MCG tablet Take 50 mcg by mouth every other day.    [provider]  levothyroxine  (SYNTHROID ) 75 MCG tablet Take 75 mcg by mouth every other day. 10/30/21   [provider]  lisinopril  (ZESTRIL ) 20 MG tablet Take 1 tablet (20 mg total) by mouth daily. 05/24/21   Angiulli, Daniel J, PA-C  melatonin 3 MG TABS tablet Take 2 tablets (6 mg total) by mouth at bedtime as needed. 10/25/24   Evonnie Lenis, MD  metFORMIN  (GLUCOPHAGE ) 1000 MG tablet Take 1 tablet (1,000 mg total) by mouth 2 (two) times daily with a meal. 05/24/21   Angiulli, Toribio PARAS, PA-C  mirtazapine  (REMERON ) 7.5 MG tablet Take 7.5 mg by mouth at bedtime. 09/29/24   [provider]  omeprazole (PRILOSEC) 40 MG capsule Take 40 mg by mouth daily. 10/03/24   [provider]  ondansetron  (ZOFRAN ) 4 MG tablet Take 4 mg by mouth every 6 (six) hours as needed for nausea or vomiting. 08/11/24   [provider]  polyethylene glycol (MIRALAX  / GLYCOLAX ) 17 g packet Take 17 g by mouth daily as needed for mild constipation. 10/25/24   Evonnie Lenis, MD  vitamin B-12 (VITAMIN B12) 500 MCG tablet Take 1 tablet (500 mcg total) by mouth daily. 10/26/24   Evonnie Lenis, MD    Physical Exam: Vitals:   12/22/24 1029 12/22/24 1030  BP: (!) 128/51   Pulse: 72   Resp: 17   Temp: 97.7 F (36.5 C)   TempSrc: Oral   SpO2: 98%   Weight:  54.4 kg  Height:  5' 1 (1.549 m)    Constitutional: NAD, calm, comfortable Vitals:   12/22/24 1029 12/22/24 1030  BP: (!) 128/51   Pulse: 72   Resp: 17   Temp: 97.7 F (36.5 C)   TempSrc: Oral   SpO2: 98%   Weight:  54.4 kg  Height:  5' 1 (1.549 m)   Eyes: PERRL, lids and conjunctivae normal ENMT: Mucous membranes are dry. Posterior pharynx clear of any exudate  or lesions.Normal dentition.  Neck: normal, supple, no masses, no thyromegaly Respiratory: clear to auscultation bilaterally, no wheezing, no crackles. Normal respiratory effort. No accessory muscle use.  Cardiovascular: Regular rate and rhythm, no murmurs / rubs / gallops. No extremity edema. 2+ pedal pulses. No carotid bruits.  Abdomen: no tenderness, no masses palpated. No hepatosplenomegaly. Bowel sounds positive.  Musculoskeletal: no clubbing / cyanosis. No joint deformity upper and lower extremities. Good ROM, no contractures. Normal muscle tone.  Skin: Large deep ulcer covered with thick callus on left heel, with surrounding edema and rash and tenderness  on palpation bilateral pedal pulses palpable.  Increased local temperature on left foot compared to right side Neurologic: CN 2-12 grossly intact. Sensation intact, DTR normal. Strength 5/5 in all 4.  Psychiatric: Normal judgment and insight. Alert and oriented x 3. Normal mood.    Labs on Admission: I have personally reviewed following labs and imaging studies  CBC: Recent Labs  Lab 12/22/24 1139  WBC 11.0*  HGB 9.3*  HCT 30.0*  MCV 87.2  PLT 269   Basic Metabolic Panel: Recent Labs  Lab 12/22/24 1139  NA 143  K 5.3*  CL 107  CO2 21*  GLUCOSE 91  BUN 32*  CREATININE 1.51*  CALCIUM  7.8*   GFR: Estimated Creatinine Clearance: 24.3 mL/min (A) (by C-G formula based on SCr of 1.51 mg/dL (H)). Liver Function Tests: No results for input(s): AST, ALT, ALKPHOS, BILITOT, PROT, ALBUMIN in the last 168 hours. No results for input(s): LIPASE, AMYLASE in the last 168 hours. No results for input(s): AMMONIA in the last 168 hours. Coagulation Profile: No results for input(s): INR, PROTIME in the last 168 hours. Cardiac Enzymes: No results for input(s): CKTOTAL, CKMB, CKMBINDEX, TROPONINI in the last 168 hours. BNP (last 3 results) No results for input(s): PROBNP in the last 8760  hours. HbA1C: No results for input(s): HGBA1C in the last 72 hours. CBG: No results for input(s): GLUCAP in the last 168 hours. Lipid Profile: No results for input(s): CHOL, HDL, LDLCALC, TRIG, CHOLHDL, LDLDIRECT in the last 72 hours. Thyroid Function Tests: No results for input(s): TSH, T4TOTAL, FREET4, T3FREE, THYROIDAB in the last 72 hours. Anemia Panel: No results for input(s): VITAMINB12, FOLATE, FERRITIN, TIBC, IRON, RETICCTPCT in the last 72 hours. Urine analysis:    Component Value Date/Time   COLORURINE YELLOW 10/20/2024 2251   APPEARANCEUR HAZY (A) 10/20/2024 2251   LABSPEC 1.027 10/20/2024 2251   PHURINE 5.0 10/20/2024 2251   GLUCOSEU NEGATIVE 10/20/2024 2251   HGBUR SMALL (A) 10/20/2024 2251   BILIRUBINUR NEGATIVE 10/20/2024 2251   KETONESUR 5 (A) 10/20/2024 2251   PROTEINUR 30 (A) 10/20/2024 2251   NITRITE NEGATIVE 10/20/2024 2251   LEUKOCYTESUR MODERATE (A) 10/20/2024 2251    Radiological Exams on Admission: DG Foot Complete Left Result Date: 12/22/2024 CLINICAL DATA:  eval signs of osteo. ulcer on heel EXAM: LEFT FOOT - COMPLETE 3+ VIEW COMPARISON:  None Available. FINDINGS: There is no evidence of fracture or dislocation. Diffuse, patchy osseous demineralization. No focal demineralization, erosion or periosteal change. There is no evidence of arthropathy or other focal bone abnormality. Soft tissues are unremarkable. IMPRESSION: 1. No acute displaced fracture, dislocation or radiographic findings of osteomyelitis within the LEFT foot. 2. Diffuse osseous demineralization, correlate for osteopenia. Electronically Signed   By: Thom Hall M.D.   On: 12/22/2024 12:49   US  Venous Img Lower Unilateral Left Result Date: 12/22/2024 CLINICAL DATA:  Left lower extremity pain and swelling EXAM: LEFT LOWER EXTREMITY VENOUS DOPPLER ULTRASOUND TECHNIQUE: Gray-scale sonography with compression, as well as color and duplex ultrasound, were  performed to evaluate the deep venous system(s) from the level of the common femoral vein through the popliteal and proximal calf veins. COMPARISON:  None Available. FINDINGS: VENOUS Normal compressibility of the common femoral, superficial femoral, and popliteal veins, as well as the visualized calf veins. Visualized portions of profunda femoral vein and great saphenous vein unremarkable. No filling defects to suggest DVT on grayscale or color Doppler imaging. Doppler waveforms show normal direction of venous flow, normal respiratory plasticity and response  to augmentation. Limited views of the contralateral common femoral vein are unremarkable. OTHER None. Limitations: none IMPRESSION: Negative. Electronically Signed   By: Wilkie Lent M.D.   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 (For example, if patient is on BP meds at home and you resume or decide to hold them, it is a problem that needs to be her. Same for CAD, COPD, HLD and so on)  Acute on chronic left heel diabetic foot ulcer infection - Concerning about osteomyelitis - MRI ordered - Continue Zosyn , check MRSA screening.  Hold off vancomycin  for now. - Case was discussed with on-call podiatry Dr. Lennie. - Other DDx, also concerning about undiagnosed PVD as patient does appear to have history of claudication first developed on the left leg about 6 months ago.  Will order ABI.  AKI Hyperkalemia -Likely prerenal secondary to dehydration, continue IV fluid - 1 dose Lokelma  PAF - In sinus rhythm - Hold off Eliquis  for possible incoming podiatry intervention  CVA -start ASA - Continue statin  Hypothyroidism - Continue Synthroid   IIDM - Continue metformin  - SSI  DVT prophylaxis: Heparin   subcu Code Status: DNR/DNI Family Communication: Daughter at bedside Disposition Plan: Expect less than 2 midnight hospital stay if osteomyelitis workup negative. Consults called: Podiatry Admission status: MedSurg observation   Cort ONEIDA Mana MD [facility] Pager [phone]  12/22/2024, 1:50 PM       [1]  Allergies Allergen Reactions   Augmentin [Amoxicillin-Pot Clavulanate] Other (See Comments)    No reaction listed on MAR   Doxycycline Other (See Comments)    No reaction listed on MAR   Lisinopril  Other (See Comments)    No reaction listed on MAR   Norvasc [Amlodipine] Other (See Comments)    No reaction listed on MAR   "

## 2024-12-22 NOTE — ED Notes (Signed)
 Bed alarm, shoes, fall risk bracelet on. Stretcher locked and in lowest position. Call bell within reach

## 2024-12-22 NOTE — ED Provider Notes (Signed)
 "  [facility] Provider Note    Event Date/Time   First MD Initiated Contact with Patient 12/22/24 1038     (approximate)   History   Wound Check   HPI  Madison Davidson is a 74 y.o. female who presents to the ED for evaluation of Wound Check   Review of medical DC summary from December.  Admitted for metabolic encephalopathy thought to be due to dehydration and UTI.  Otherwise has history of paroxysmal A-fib on DOAC, right MCA stroke, DM and HTN.  Patient presents with a worsening ulcerative wound to his foot, left heel.  Increased weakness   Physical Exam   Triage Vital Signs: ED Triage Vitals  Encounter Vitals Group     BP 12/22/24 1029 (!) 128/51     Girls Systolic BP Percentile --      Girls Diastolic BP Percentile --      Boys Systolic BP Percentile --      Boys Diastolic BP Percentile --      Pulse Rate 12/22/24 1029 72     Resp 12/22/24 1029 17     Temp 12/22/24 1029 97.7 F (36.5 C)     Temp Source 12/22/24 1029 Oral     SpO2 12/22/24 1029 98 %     Weight 12/22/24 1030 120 lb (54.4 kg)     Height 12/22/24 1030 5' 1 (1.549 m)     Head Circumference --      Peak Flow --      Pain Score 12/22/24 1030 7     Pain Loc --      Pain Education --      Exclude from Growth Chart --     Most recent vital signs: Vitals:   12/22/24 1029  BP: (!) 128/51  Pulse: 72  Resp: 17  Temp: 97.7 F (36.5 C)  SpO2: 98%    General: Awake, no distress.  CV:  Good peripheral perfusion.  Resp:  Normal effort.  Abd:  No distention.  MSK:  No deformity noted.  Eschar to the left heel, bilateral legs pictured below.  Swelling diffusely to the left foot Neuro:  No focal deficits appreciated. Other:         ED Results / Procedures / Treatments   Labs (all labs ordered are listed, but only abnormal results are displayed) Labs Reviewed  CBC  BASIC METABOLIC PANEL WITH GFR    EKG   RADIOLOGY Plain film of the left foot interpreted by me  without clear signs of bony erosion  Official radiology report(s): No results found.  PROCEDURES and INTERVENTIONS:  Procedures  Medications - No data to display   IMPRESSION / MDM / ASSESSMENT AND PLAN / ED COURSE  I reviewed the triage vital signs and the nursing notes.  Differential diagnosis includes, but is not limited to, osteomyelitis, abscess, cellulitis  {Patient presents with symptoms of an acute illness or injury that is potentially life-threatening.  Patient presents with worsening diabetic foot infection requiring medical admission.  X-ray without clear signs of osteomyelitis.  Reassuring ABIs without clear vascular impairment.  AKI is noted.  Start IV fluids antibiotics and consult medicine for admission  Clinical Course as of 12/23/24 1517  Tue Dec 22, 2024  1120 ABI of 1.  [DS]  1321 I consulted medicine who agrees to admit [DS]    Clinical Course User Index [DS] Claudene Rover, MD     FINAL CLINICAL IMPRESSION(S) / ED DIAGNOSES  Final diagnoses:  None     Rx / DC Orders   ED Discharge Orders     None        Note:  This document was prepared using Dragon voice recognition software and may include unintentional dictation errors.   Claudene Rover, MD 12/23/24 [phone]  "

## 2024-12-23 DIAGNOSIS — S91302A Unspecified open wound, left foot, initial encounter: Secondary | ICD-10-CM

## 2024-12-23 DIAGNOSIS — L97529 Non-pressure chronic ulcer of other part of left foot with unspecified severity: Secondary | ICD-10-CM | POA: Diagnosis not present

## 2024-12-23 DIAGNOSIS — I739 Peripheral vascular disease, unspecified: Secondary | ICD-10-CM | POA: Diagnosis not present

## 2024-12-23 LAB — CBC
HCT: 23 % — ABNORMAL LOW (ref 36.0–46.0)
Hemoglobin: 7.5 g/dL — ABNORMAL LOW (ref 12.0–15.0)
MCH: 27.8 pg (ref 26.0–34.0)
MCHC: 32.6 g/dL (ref 30.0–36.0)
MCV: 85.2 fL (ref 80.0–100.0)
Platelets: 222 10*3/uL (ref 150–400)
RBC: 2.7 MIL/uL — ABNORMAL LOW (ref 3.87–5.11)
RDW: 15.8 % — ABNORMAL HIGH (ref 11.5–15.5)
WBC: 8.3 10*3/uL (ref 4.0–10.5)
nRBC: 0 % (ref 0.0–0.2)

## 2024-12-23 LAB — BASIC METABOLIC PANEL WITH GFR
Anion gap: 9 (ref 5–15)
BUN: 27 mg/dL — ABNORMAL HIGH (ref 8–23)
CO2: 22 mmol/L (ref 22–32)
Calcium: 6.6 mg/dL — ABNORMAL LOW (ref 8.9–10.3)
Chloride: 111 mmol/L (ref 98–111)
Creatinine, Ser: 1.2 mg/dL — ABNORMAL HIGH (ref 0.44–1.00)
GFR, Estimated: 47 mL/min — ABNORMAL LOW
Glucose, Bld: 78 mg/dL (ref 70–99)
Potassium: 4.6 mmol/L (ref 3.5–5.1)
Sodium: 143 mmol/L (ref 135–145)

## 2024-12-23 LAB — GLUCOSE, CAPILLARY
Glucose-Capillary: 71 mg/dL (ref 70–99)
Glucose-Capillary: 122 mg/dL — ABNORMAL HIGH (ref 70–99)
Glucose-Capillary: 171 mg/dL — ABNORMAL HIGH (ref 70–99)
Glucose-Capillary: 69 mg/dL — ABNORMAL LOW (ref 70–99)
Glucose-Capillary: 74 mg/dL (ref 70–99)

## 2024-12-23 LAB — APTT
aPTT: 92 s — ABNORMAL HIGH (ref 24–36)
aPTT: 107 s — ABNORMAL HIGH (ref 24–36)

## 2024-12-23 LAB — HEPARIN LEVEL (UNFRACTIONATED): Heparin Unfractionated: >1.1 [IU]/mL — ABNORMAL HIGH (ref 0.30–0.70)

## 2024-12-23 NOTE — H&P (View-Only) (Signed)
 " [facility] Vascular Consult Note  MRN : [id number]  Madison Davidson is a 74 y.o. (09-20-1951) female who presents with chief complaint of  Chief Complaint  Patient presents with   Wound Check  .   Consulting Physician:Prince Djan, MD Reason for consult: PAD History of Present Illness: Madison Davidson is a 74 year old female who presents with a past medical history of paroxysmal atrial fibrillation, hypertension, coronary artery disease, hypothyroidism, CVA that presented with a worsening left heel wound and dehydration.  Prior to this incident she had a wound that was slow healing that was previously seen outpatient by our colleagues in [place] that ordered ABIs.  These would not be able to be done outpatient as planned but were done during her hospitalization.  She has a right ABI 0.90 on the left 0.81.  It noted that she had biphasic waveforms in her bilateral tibial vessels.  Patient is both indicated to try to be minimally invasive as possible.  Current Facility-Administered Medications  Medication Dose Route Frequency Provider Last Rate Last Admin   acetaminophen  (TYLENOL ) tablet 650 mg  650 mg Oral Q6H PRN Laurita Manor T, MD       Or   acetaminophen  (TYLENOL ) suppository 650 mg  650 mg Rectal Q6H PRN Laurita Manor T, MD       amiodarone  (PACERONE ) tablet 200 mg  200 mg Oral BID Laurita Manor T, MD   200 mg at 12/23/24 9068   atorvastatin  (LIPITOR ) tablet 40 mg  40 mg Oral QHS Laurita Manor T, MD   40 mg at 12/22/24 2300   collagenase  (SANTYL ) ointment 1 Application  1 Application Topical Daily Lennie Barter, DPM   1 Application at 12/23/24 9062   dextrose  5 %-0.9 % sodium chloride  infusion   Intravenous Continuous Laurita Manor T, MD 100 mL/hr at 12/23/24 0717 Restarted at 12/23/24 0717   divalproex  (DEPAKOTE ) DR tablet 125 mg  125 mg Oral BID Laurita Manor T, MD   125 mg at 12/23/24 9062   famotidine  (PEPCID ) tablet 20 mg  20 mg Oral QHS Laurita Manor T, MD   20 mg  at 12/22/24 2300   heparin  ADULT infusion 100 units/mL (25000 units/250mL)  800 Units/hr Intravenous Continuous Laurita Manor T, MD 8 mL/hr at 12/23/24 0644 800 Units/hr at 12/23/24 0644   hydrALAZINE  (APRESOLINE ) injection 5 mg  5 mg Intravenous Q6H PRN Laurita Manor T, MD       HYDROmorphone  (DILAUDID ) injection 0.5-1 mg  0.5-1 mg Intravenous Q4H PRN Laurita Manor T, MD       insulin  aspart (novoLOG ) injection 0-9 Units  0-9 Units Subcutaneous TID WC Laurita Manor DASEN, MD   1 Units at 12/23/24 1307   levothyroxine  (SYNTHROID ) tablet 50 mcg  50 mcg Oral QODAY Zhang, Ping T, MD   50 mcg at 12/22/24 1712   levothyroxine  (SYNTHROID ) tablet 75 mcg  75 mcg Oral QODAY Greenwood, Howard F, RPH   75 mcg at 12/23/24 9387   LORazepam  (ATIVAN ) injection 0.5 mg  0.5 mg Intravenous Q6H PRN Laurita Manor T, MD       mirtazapine  (REMERON ) tablet 7.5 mg  7.5 mg Oral QHS Laurita Manor T, MD   7.5 mg at 12/22/24 2300   ondansetron  (ZOFRAN ) tablet 4 mg  4 mg Oral Q6H PRN Laurita Manor DASEN, MD       Or   ondansetron  (ZOFRAN ) injection 4 mg  4 mg Intravenous Q6H PRN Laurita Manor DASEN, MD  pantoprazole  (PROTONIX ) EC tablet 40 mg  40 mg Oral Daily Zhang, Ping T, MD   40 mg at 12/23/24 9068   piperacillin -tazobactam (ZOSYN ) IVPB 3.375 g  3.375 g Intravenous Q8H [place] BROCK[place], [place] 12.5 mL/hr at 12/23/24 0721 3.375 g at 12/23/24 9278   polyethylene glycol (MIRALAX  / GLYCOLAX ) packet 17 g  17 g Oral Daily PRN Laurita Cort DASEN, MD       vancomycin  LUCRECIA) IVPB 1250 mg/250 mL  1,250 mg Intravenous Once Claudene Rover, MD   Held at 12/22/24 1402    Past Medical History:  Diagnosis Date   Cancer [facility])    skin   Coronary artery disease    Diabetes mellitus without complication (HCC)    Hypertension    Stroke [facility])     Past Surgical History:  Procedure Laterality Date   ABDOMINAL HYSTERECTOMY     IR ANGIO INTRA EXTRACRAN SEL INTERNAL CAROTID BILAT MOD SED  05/11/2021   IR ANGIO VERTEBRAL SEL VERTEBRAL UNI R MOD SED  05/11/2021    IR ANGIOGRAM EXTREMITY LEFT  05/11/2021   IR CT HEAD LTD  05/11/2021   IR INTRA CRAN STENT  05/11/2021   IR INTRA CRAN STENT  05/12/2021   IR US  GUIDE VASC ACCESS RIGHT  05/11/2021   RADIOLOGY WITH ANESTHESIA N/A 05/11/2021   Procedure: IR WITH ANESTHESIA - INTRACRANIAL STENT;  Surgeon: de Macedo Rodrigues, Katyucia, MD;  Location: [facility] OR;  Service: Radiology;  Laterality: N/A;    Social History Social History[1]  Family History Family History  Problem Relation Age of Onset   Heart disease Mother    Heart disease Father     Allergies[2]   REVIEW OF SYSTEMS (Negative unless checked)  Constitutional: [] Weight loss  [] Fever  [] Chills Cardiac: [] Chest pain   [] Chest pressure   [] Palpitations   [] Shortness of breath when laying flat   [] Shortness of breath at rest   [] Shortness of breath with exertion. Vascular:  [] Pain in legs with walking   [] Pain in legs at rest   [] Pain in legs when laying flat   [] Claudication   [] Pain in feet when walking  [] Pain in feet at rest  [] Pain in feet when laying flat   [] History of DVT   [] Phlebitis   [x] Swelling in legs   [] Varicose veins   [x] Non-healing ulcers Pulmonary:   [] Uses home oxygen   [] Productive cough   [] Hemoptysis   [] Wheeze  [] COPD   [] Asthma Neurologic:  [] Dizziness  [] Blackouts   [] Seizures   [] History of stroke   [] History of TIA  [] Aphasia   [] Temporary blindness   [] Dysphagia   [] Weakness or numbness in arms   [] Weakness or numbness in legs Musculoskeletal:  [] Arthritis   [] Joint swelling   [] Joint pain   [] Low back pain Hematologic:  [] Easy bruising  [] Easy bleeding   [] Hypercoagulable state   [] Anemic  [] Hepatitis Gastrointestinal:  [] Blood in stool   [] Vomiting blood  [] Gastroesophageal reflux/heartburn   [] Difficulty swallowing. Genitourinary:  [] Chronic kidney disease   [] Difficult urination  [] Frequent urination  [] Burning with urination   [] Blood in urine Skin:  [] Rashes   [] Ulcers   [] Wounds Psychological:  [] History of anxiety    []  History of major depression.  Physical Examination  Vitals:   12/22/24 2024 12/23/24 0421 12/23/24 0858 12/23/24 1617  BP: (!) 135/44 (!) 114/94 (!) 123/58 (!) 139/55  Pulse: 63 94 63 67  Resp: 18 18 17 17   Temp: 97.7 F (36.5 C) 97.9 F (36.6  C) 98 F (36.7 C) 98 F (36.7 C)  TempSrc:      SpO2: 98% 95% 99% 100%  Weight:      Height:       Body mass index is 22.67 kg/m. Gen:  WD/WN, NAD Head: [place]/AT, No temporalis wasting. Prominent temp pulse not noted. Ear/Nose/Throat: Hearing grossly intact, nares w/o erythema or drainage, oropharynx w/o Erythema/Exudate Eyes: Sclera non-icteric, conjunctiva clear Neck: Trachea midline.  No JVD.  Pulmonary:  Good air movement, respirations not labored, equal bilaterally.  Cardiac: RRR, normal S1, S2. Vascular: Bilateral wounds on the great toes small in nature, left heel ulceration Vessel Right Left  Radial Palpable Palpable  PT Not Palpable Not Palpable  DP Not Palpable Not Palpable   Gastrointestinal: soft, non-tender/non-distended. No guarding/reflex.  Musculoskeletal: M/S 5/5 throughout.  Extremities without ischemic changes.  No deformity or atrophy.  Neurologic: Sensation grossly intact in extremities.  Symmetrical.  Speech is fluent. Motor exam as listed above. Psychiatric: Judgment intact, Mood & affect appropriate for pt's clinical situation.     CBC Lab Results  Component Value Date   WBC 8.3 12/23/2024   HGB 7.5 (L) 12/23/2024   HCT 23.0 (L) 12/23/2024   MCV 85.2 12/23/2024   PLT 222 12/23/2024    BMET    Component Value Date/Time   NA 143 12/23/2024 0501   K 4.6 12/23/2024 0501   CL 111 12/23/2024 0501   CO2 22 12/23/2024 0501   GLUCOSE 78 12/23/2024 0501   BUN 27 (H) 12/23/2024 0501   CREATININE 1.20 (H) 12/23/2024 0501   CALCIUM  6.6 (L) 12/23/2024 0501   GFRNONAA 47 (L) 12/23/2024 0501   Estimated Creatinine Clearance: 30.6 mL/min (A) (by C-G formula based on SCr of 1.2 mg/dL (H)).  COAG Lab  Results  Component Value Date   INR 1.1 06/05/2022   INR 1.1 06/12/2021    Radiology US  ARTERIAL ABI (SCREENING LOWER EXTREMITY) Result Date: 12/22/2024 CLINICAL DATA:  Foot ulcer EXAM: NONINVASIVE PHYSIOLOGIC VASCULAR STUDY OF BILATERAL LOWER EXTREMITIES TECHNIQUE: Evaluation of both lower extremities were performed at rest, including calculation of ankle-brachial indices with single level pressure measurements and doppler recording. COMPARISON:  None available. FINDINGS: Right ABI:  0.90 Left ABI:  0.81 Right Lower Extremity:  Normal arterial waveforms at the ankle. Left Lower Extremity:  Normal arterial waveforms at the ankle. 0.8-0.89 Mild PAD IMPRESSION: Decreased ankle-brachial indices consistent with mild BILATERAL lower extremity peripheral arterial disease. Electronically Signed   By: Aliene Lloyd M.D.   On: 12/22/2024 15:11   MR ANKLE LEFT WO CONTRAST Result Date: 12/22/2024 EXAM: MRI of the left Ankle without contrast. 12/22/2024 02:16:53 PM TECHNIQUE: Multiplanar multisequence MRI of the left ankle was performed without the administration of intravenous contrast. COMPARISON: None available. CLINICAL HISTORY: Heal wound, possible osteomyelitis. FINDINGS: SYNDESMOTIC LIGAMENTS: Intact anterior inferior tibiofibular ligament and posterior inferior tibiofibular ligament. No significant periligamentous edema or interval widening. LATERAL COLLATERAL LIGAMENT COMPLEX: Intact anterior talofibular ligament, posterior talofibular ligament and calcaneofibular ligament. DELTOID LIGAMENT COMPLEX: Intact superficial and deep components. SINUS TARSI AND SPRING LIGAMENT: Visualized portions of the spring ligament are intact. Normal appearance of the sinus tarsi. MEDIAL TENDONS: Distal tibialis posterior tenosynovitis. Mild flexor hallucis longus tenosynovitis proximal to the knot of henry. Intact flexor digitorum longus tendon. LATERAL TENDONS: Longitudinal split tear of the peroneus brevis tendon adjacent to  the lateral malleolus. Intact peroneus longus tendon. ANTERIOR TENDONS: The tibialis anterior, extensor hallucis longus and extensor digitorum longus tendons are normal in position,  morphology and signal. ACHILLES TENDON: Mild distal achilles tendinopathy with faintly increased internal T2 signal and anterior convexity of the tendon. No associated bursitis. PLANTAR FASCIA: The medial and lateral bundles of the plantar fascia are normal in morphology and signal. There is no acute plantar fasciitis or tear. No plantar fascial nodules. TARSAL TUNNEL: There are no obstructing lesions within the tarsal tunnel. BONE MARROW: Trace endosteal edema along the adjacent calcaneus with demineralized cortex on image 24 series 4, suspicious for early calcaneal osteomyelitis. No acute fracture. No aggressive marrow replacing lesion. JOINT SPACES: No significant joint effusion. No significant degenerative changes. Normal alignment. SOFT TISSUES: Posterior heel wound behind the calcaneus. Subcutaneous edema circumferentially at the ankle (especially laterally) and tracking into the dorsum of the foot, cellulitis is not excluded. IMPRESSION: 1. Posterior heel wound with adjacent calcaneal cortical demineralization and trace endosteal edema, suspicious for early calcaneal osteomyelitis. 2. Circumferential ankle and dorsal foot subcutaneous edema, cellulitis not excluded. 3. Longitudinal split tear of the peroneus brevis tendon adjacent to the lateral malleolus. 4. Distal tibialis posterior tenosynovitis. 5. Mild flexor hallucis longus tenosynovitis proximal to the knot of Henry. 6. Mild distal Achilles tendinopathy. Electronically signed by: Ryan Salvage MD 12/22/2024 02:45 PM EST RP Workstation: [id number]   DG Foot Complete Left Result Date: 12/22/2024 CLINICAL DATA:  eval signs of osteo. ulcer on heel EXAM: LEFT FOOT - COMPLETE 3+ VIEW COMPARISON:  None Available. FINDINGS: There is no evidence of fracture or dislocation.  Diffuse, patchy osseous demineralization. No focal demineralization, erosion or periosteal change. There is no evidence of arthropathy or other focal bone abnormality. Soft tissues are unremarkable. IMPRESSION: 1. No acute displaced fracture, dislocation or radiographic findings of osteomyelitis within the LEFT foot. 2. Diffuse osseous demineralization, correlate for osteopenia. Electronically Signed   By: Thom Hall M.D.   On: 12/22/2024 12:49   US  Venous Img Lower Unilateral Left Result Date: 12/22/2024 CLINICAL DATA:  Left lower extremity pain and swelling EXAM: LEFT LOWER EXTREMITY VENOUS DOPPLER ULTRASOUND TECHNIQUE: Gray-scale sonography with compression, as well as color and duplex ultrasound, were performed to evaluate the deep venous system(s) from the level of the common femoral vein through the popliteal and proximal calf veins. COMPARISON:  None Available. FINDINGS: VENOUS Normal compressibility of the common femoral, superficial femoral, and popliteal veins, as well as the visualized calf veins. Visualized portions of profunda femoral vein and great saphenous vein unremarkable. No filling defects to suggest DVT on grayscale or color Doppler imaging. Doppler waveforms show normal direction of venous flow, normal respiratory plasticity and response to augmentation. Limited views of the contralateral common femoral vein are unremarkable. OTHER None. Limitations: none IMPRESSION: Negative. Electronically Signed   By: Wilkie Lent M.D.   On: 12/22/2024 12:20      Assessment/Plan 1. Peripheral arterial disease with ulceration   I spoke with the patient and the daughter and the patient prefers to be as conservative as possible at this time.  Her ABIs do indicate that she has some level of peripheral arterial disease but it is certainly possible that she may heal without intervention.  I discussed with both patient and her daughter that because she is undergoing debridement we will be able to  assess how well she bleeds during this procedure.  If her bleeding is minimal then an angiogram would likely be in her best interest however if she does have good bleeding during her procedure this is something that we can continue to follow the patient with on an outpatient  basis.  I discussed that even if she has good bleeding during her procedure if she still continues to be slow with healing her wound angiogram still may be necessary but this can be plan on a more outpatient basis.  Discussed the angiogram procedure with the patient, but we will go into more detail if it is something that she requires.  We will follow-up with the patient following her procedure.   Plan of care discussed with Dr. Marea and he is in agreement with plan noted above.   Family Communication: Spoke with Daughter Samule via Telephone    I spent 75 minutes in this encounter including personally reviewing extensive medical records, personally reviewing imaging studies and compared to prior scans, counseling the patient, placing orders, coordinating care and performing appropriate documentation  Thank you for allowing us  to participate in the care of this patient.   Kambrey Hagger E Kenzy Campoverde, NP [facility] [phone] (Office Phone) [phone] (Office Fax) [phone] (Pager)  12/23/2024 4:32 PM  Staff may message me via secure chat in Epic  but this may not receive immediate response,  please page for urgent matters!  Dictation software was used to generate the above note. Typos may occur and escape review, as with typed/written notes. Any error is purely unintentional.  Please contact me directly for clarity if needed.       [1]  Social History Tobacco Use   Smoking status: Never   Smokeless tobacco: Never  Substance Use Topics   Alcohol use: No   Drug use: No  [2]  Allergies Allergen Reactions   Augmentin [Amoxicillin-Pot Clavulanate] Other (See Comments)    No reaction listed  on MAR   Doxycycline Other (See Comments)    No reaction listed on MAR   Lisinopril  Other (See Comments)    No reaction listed on MAR   Norvasc [Amlodipine] Other (See Comments)    No reaction listed on MAR   "

## 2024-12-23 NOTE — Progress Notes (Signed)
 PHARMACY - ANTICOAGULATION CONSULT NOTE  Pharmacy Consult for Heparin   Indication: atrial fibrillation  Allergies[1]  Patient Measurements: Height: 5' 1 (154.9 cm) Weight: 54.4 kg (120 lb) IBW/kg (Calculated) : 47.8 HEPARIN  DW (KG): 54.4  Vital Signs: Temp: 97.9 F (36.6 C) (01/28 0421) BP: 114/94 (01/28 0421) Pulse Rate: 94 (01/28 0421)  Labs: Recent Labs    12/22/24 1139 12/23/24 0501 12/23/24 0805  HGB 9.3* 7.5*  --   HCT 30.0* 23.0*  --   PLT 269 222  --   APTT  --   --  92*  HEPARINUNFRC  --   --  >1.10*  CREATININE 1.51* 1.20*  --     Estimated Creatinine Clearance: 30.6 mL/min (A) (by C-G formula based on SCr of 1.2 mg/dL (H)).   Medical History: Past Medical History:  Diagnosis Date   Cancer (HCC)    skin   Coronary artery disease    Diabetes mellitus without complication (HCC)    Hypertension    Stroke (HCC)     Medications:  Medications Prior to Admission  Medication Sig Dispense Refill Last Dose/Taking   acetaminophen  (TYLENOL ) 325 MG tablet Take 2 tablets (650 mg total) by mouth every 6 (six) hours as needed for mild pain, fever or headache.   Unknown   amiodarone  (PACERONE ) 200 MG tablet Take 1 tablet (200 mg total) by mouth 2 (two) times daily.   12/22/2024 Morning   apixaban  (ELIQUIS ) 5 MG TABS tablet Take 1 tablet (5 mg total) by mouth 2 (two) times daily. 60 tablet 3 12/22/2024 Morning   atorvastatin  (LIPITOR ) 40 MG tablet Take 1 tablet (40 mg total) by mouth daily.   12/21/2024 Bedtime   divalproex  (DEPAKOTE ) 125 MG DR tablet Take 125 mg by mouth 2 (two) times daily.   12/22/2024 Morning   famotidine  (PEPCID ) 20 MG tablet Take 20 mg by mouth at bedtime.   12/21/2024   levothyroxine  (SYNTHROID ) 50 MCG tablet Take 50 mcg by mouth every other day.   Past Week   levothyroxine  (SYNTHROID ) 75 MCG tablet Take 75 mcg by mouth every other day.   Past Week   lisinopril  (ZESTRIL ) 20 MG tablet Take 1 tablet (20 mg total) by mouth daily. 30 tablet 0  12/22/2024 Morning   melatonin 3 MG TABS tablet Take 2 tablets (6 mg total) by mouth at bedtime as needed.   Unknown   metFORMIN  (GLUCOPHAGE ) 1000 MG tablet Take 1 tablet (1,000 mg total) by mouth 2 (two) times daily with a meal. 60 tablet 0 12/22/2024 Morning   mirtazapine  (REMERON ) 7.5 MG tablet Take 7.5 mg by mouth at bedtime.   12/21/2024   omeprazole (PRILOSEC) 40 MG capsule Take 40 mg by mouth daily.   Unknown   ondansetron  (ZOFRAN ) 4 MG tablet Take 4 mg by mouth every 6 (six) hours as needed for nausea or vomiting.   Unknown   polyethylene glycol (MIRALAX  / GLYCOLAX ) 17 g packet Take 17 g by mouth daily as needed for mild constipation.   Unknown   cholecalciferol (VITAMIN D3) 25 MCG (1000 UNIT) tablet Take 1,000 Units by mouth daily. (Patient not taking: Reported on 12/22/2024)   Not Taking   ferrous sulfate  325 (65 FE) MG tablet Take 325 mg by mouth every Monday, Wednesday, and Friday. (Patient not taking: Reported on 12/22/2024)   Not Taking   folic acid  (FOLVITE ) 1 MG tablet Take 1 tablet (1 mg total) by mouth daily. (Patient not taking: Reported on 12/22/2024)   Not Taking  vitamin B-12 (VITAMIN B12) 500 MCG tablet Take 1 tablet (500 mcg total) by mouth daily. (Patient not taking: Reported on 12/22/2024)   Not Taking    Assessment: Pharmacy consulted to dose heparin  in this 74 year old female with Afib, possible upcoming surgery.   Pt was on Eliquis  5 mg PO BID PTA,  last dose on 1/27 AM.  CrCl = 24.3 ml/min   Date Time HL  /  aPTT Rate/Comment 1/28 0805 >1.10 / 92 aPTT therapeutic on 800 units/hour  Goal of Therapy:  Heparin  level 0.3-0.7 units/ml aPTT 66 - 102  seconds Monitor platelets by anticoagulation protocol: Yes   Plan:  - HL >1.10, aPTT therapeutic x1, levels not correlating; Hgb 7.5 - Continue heparin  gtt at 800 units/hour - Check confirmatory aPTT in 8 hours - will use aPTT to guide dosing until correlating with HL - Daily heparin  levels and CBC while on heparin   infusion  Thank you for involving pharmacy in this patient's care.   Damien Napoleon, PharmD Clinical Pharmacist 12/23/2024 9:00 AM    [1]  Allergies Allergen Reactions   Augmentin [Amoxicillin-Pot Clavulanate] Other (See Comments)    No reaction listed on MAR   Doxycycline Other (See Comments)    No reaction listed on MAR   Lisinopril  Other (See Comments)    No reaction listed on MAR   Norvasc [Amlodipine] Other (See Comments)    No reaction listed on [facility]

## 2024-12-23 NOTE — Progress Notes (Signed)
" ° ° °  PROCEDURAL EXPEDITER PROGRESS NOTE  Patient Name: ELISABETH STROM  DOB:December 16, 1950 Date of Admission: 12/22/2024  Date of Assessment:12/23/24   -------------------------------------------------------------------------------------------------------------------   Brief clinical summary: patient going for left foot wound debridement, possible wound vac and skin graft placement on 12-24-24.  Orders in place:   Yes   Communication with surgical team if no orders: n/a  Labs, test, and orders reviewed: yes  Requires surgical clearance:   No  What type of clearance: n/a  Clearance received: n/a  Barriers noted: none   Intervention provided by [facility] team: n/a  Barrier resolved:   not applicable   -------------------------------------------------------------------------------------------------------------------  [employer], Rexene LITTIE Kirks Please contact us  directly via secure chat (search for [facility]) or by calling us  at [phone] [facility]).  "

## 2024-12-23 NOTE — Progress Notes (Signed)
 PHARMACY - ANTICOAGULATION CONSULT NOTE  Pharmacy Consult for Heparin   Indication: atrial fibrillation  Allergies[1]  Patient Measurements: Height: 5' 1 (154.9 cm) Weight: 54.4 kg (120 lb) IBW/kg (Calculated) : 47.8 HEPARIN  DW (KG): 54.4  Vital Signs: Temp: 98 F (36.7 C) (01/28 1617) BP: 139/55 (01/28 1617) Pulse Rate: 67 (01/28 1617)  Labs: Recent Labs    12/22/24 1139 12/23/24 0501 12/23/24 0805 12/23/24 1612  HGB 9.3* 7.5*  --   --   HCT 30.0* 23.0*  --   --   PLT 269 222  --   --   APTT  --   --  92* 107*  HEPARINUNFRC  --   --  >1.10*  --   CREATININE 1.51* 1.20*  --   --     Estimated Creatinine Clearance: 30.6 mL/min (A) (by C-G formula based on SCr of 1.2 mg/dL (H)).   Medical History: Past Medical History:  Diagnosis Date   Cancer (HCC)    skin   Coronary artery disease    Diabetes mellitus without complication (HCC)    Hypertension    Stroke (HCC)     Medications:  Medications Prior to Admission  Medication Sig Dispense Refill Last Dose/Taking   acetaminophen  (TYLENOL ) 325 MG tablet Take 2 tablets (650 mg total) by mouth every 6 (six) hours as needed for mild pain, fever or headache.   Unknown   amiodarone  (PACERONE ) 200 MG tablet Take 1 tablet (200 mg total) by mouth 2 (two) times daily.   12/22/2024 Morning   apixaban  (ELIQUIS ) 5 MG TABS tablet Take 1 tablet (5 mg total) by mouth 2 (two) times daily. 60 tablet 3 12/22/2024 Morning   atorvastatin  (LIPITOR ) 40 MG tablet Take 1 tablet (40 mg total) by mouth daily.   12/21/2024 Bedtime   divalproex  (DEPAKOTE ) 125 MG DR tablet Take 125 mg by mouth 2 (two) times daily.   12/22/2024 Morning   famotidine  (PEPCID ) 20 MG tablet Take 20 mg by mouth at bedtime.   12/21/2024   levothyroxine  (SYNTHROID ) 50 MCG tablet Take 50 mcg by mouth every other day.   Past Week   levothyroxine  (SYNTHROID ) 75 MCG tablet Take 75 mcg by mouth every other day.   Past Week   lisinopril  (ZESTRIL ) 20 MG tablet Take 1 tablet (20 mg  total) by mouth daily. 30 tablet 0 12/22/2024 Morning   melatonin 3 MG TABS tablet Take 2 tablets (6 mg total) by mouth at bedtime as needed.   Unknown   metFORMIN  (GLUCOPHAGE ) 1000 MG tablet Take 1 tablet (1,000 mg total) by mouth 2 (two) times daily with a meal. 60 tablet 0 12/22/2024 Morning   mirtazapine  (REMERON ) 7.5 MG tablet Take 7.5 mg by mouth at bedtime.   12/21/2024   omeprazole (PRILOSEC) 40 MG capsule Take 40 mg by mouth daily.   Unknown   ondansetron  (ZOFRAN ) 4 MG tablet Take 4 mg by mouth every 6 (six) hours as needed for nausea or vomiting.   Unknown   polyethylene glycol (MIRALAX  / GLYCOLAX ) 17 g packet Take 17 g by mouth daily as needed for mild constipation.   Unknown   cholecalciferol (VITAMIN D3) 25 MCG (1000 UNIT) tablet Take 1,000 Units by mouth daily. (Patient not taking: Reported on 12/22/2024)   Not Taking   ferrous sulfate  325 (65 FE) MG tablet Take 325 mg by mouth every Monday, Wednesday, and Friday. (Patient not taking: Reported on 12/22/2024)   Not Taking   folic acid  (FOLVITE ) 1 MG tablet Take  1 tablet (1 mg total) by mouth daily. (Patient not taking: Reported on 12/22/2024)   Not Taking   vitamin B-12 (VITAMIN B12) 500 MCG tablet Take 1 tablet (500 mcg total) by mouth daily. (Patient not taking: Reported on 12/22/2024)   Not Taking    Assessment: Pharmacy consulted to dose heparin  in this 74 year old female with Afib, possible upcoming surgery.   Pt was on Eliquis  5 mg PO BID PTA,  last dose on 1/27 AM.  CrCl = 24.3 ml/min   Date Time HL  /  aPTT Rate/Comment 1/28 0805 >1.10 / 92 aPTT therapeutic on 800 units/hour 1/28 1612 --  /  107 aPTT Supratherapeutic on 800 units/hr  Goal of Therapy:  Heparin  level 0.3-0.7 units/ml aPTT 66 - 102  seconds Monitor platelets by anticoagulation protocol: Yes   Plan:  - aPTT supratherapeutic - Decrease heparin  gtt to 750 units/hour - Check aPTT in 8 hours - will use aPTT to guide dosing until correlating with HL - Daily  heparin  levels and CBC while on heparin  infusion  Thank you for involving pharmacy in this patient's care.   Amarianna Abplanalp A Ledonna Dormer, PharmD Clinical Pharmacist 12/23/2024 5:15 PM      [1]  Allergies Allergen Reactions   Augmentin [Amoxicillin-Pot Clavulanate] Other (See Comments)    No reaction listed on MAR   Doxycycline Other (See Comments)    No reaction listed on MAR   Lisinopril  Other (See Comments)    No reaction listed on MAR   Norvasc [Amlodipine] Other (See Comments)    No reaction listed on [facility]

## 2024-12-23 NOTE — Plan of Care (Signed)
  Problem: Education: Goal: Ability to describe self-care measures that may prevent or decrease complications (Diabetes Survival Skills Education) will improve Outcome: Progressing Goal: Individualized Educational Video(s) Outcome: Progressing   Problem: Coping: Goal: Ability to adjust to condition or change in health will improve Outcome: Progressing   Problem: Fluid Volume: Goal: Ability to maintain a balanced intake and output will improve Outcome: Progressing   Problem: Health Behavior/Discharge Planning: Goal: Ability to identify and utilize available resources and services will improve Outcome: Progressing Goal: Ability to manage health-related needs will improve Outcome: Progressing   Problem: Nutritional: Goal: Maintenance of adequate nutrition will improve Outcome: Progressing Goal: Progress toward achieving an optimal weight will improve Outcome: Progressing   Problem: Skin Integrity: Goal: Risk for impaired skin integrity will decrease Outcome: Progressing

## 2024-12-23 NOTE — Consult Note (Signed)
 " [facility] Vascular Consult Note  MRN : [id number]  Madison [facility] is a 74 y.o. (09-20-1951) female who presents with chief complaint of  Chief Complaint  Patient presents with   Wound Check  .   Consulting Physician:Madison Djan, MD Reason for consult: PAD History of Present Illness: Madison [facility] is a 74 year old female who presents with a past medical history of paroxysmal atrial fibrillation, hypertension, coronary artery disease, hypothyroidism, CVA that presented with a worsening left heel wound and dehydration.  Prior to this incident she had a wound that was slow healing that was previously seen outpatient by our colleagues in [place] that ordered ABIs.  These would not be able to be done outpatient as planned but were done during her hospitalization.  She has a right ABI 0.90 on the left 0.81.  It noted that she had biphasic waveforms in her bilateral tibial vessels.  Patient is both indicated to try to be minimally invasive as possible.  Current Facility-Administered Medications  Medication Dose Route Frequency Provider Last Rate Last Admin   acetaminophen  (TYLENOL ) tablet 650 mg  650 mg Oral Q6H PRN Laurita Manor T, MD       Or   acetaminophen  (TYLENOL ) suppository 650 mg  650 mg Rectal Q6H PRN Laurita Manor T, MD       amiodarone  (PACERONE ) tablet 200 mg  200 mg Oral BID Laurita Manor T, MD   200 mg at 12/23/24 9068   atorvastatin  (LIPITOR ) tablet 40 mg  40 mg Oral QHS Laurita Manor T, MD   40 mg at 12/22/24 2300   collagenase  (SANTYL ) ointment 1 Application  1 Application Topical Daily Lennie Barter, DPM   1 Application at 12/23/24 9062   dextrose  5 %-0.9 % sodium chloride  infusion   Intravenous Continuous Laurita Manor T, MD 100 mL/hr at 12/23/24 0717 Restarted at 12/23/24 0717   divalproex  (DEPAKOTE ) DR tablet 125 mg  125 mg Oral BID Laurita Manor T, MD   125 mg at 12/23/24 9062   famotidine  (PEPCID ) tablet 20 mg  20 mg Oral QHS Laurita Manor T, MD   20 mg  at 12/22/24 2300   heparin  ADULT infusion 100 units/mL (25000 units/250mL)  800 Units/hr Intravenous Continuous Laurita Manor T, MD 8 mL/hr at 12/23/24 0644 800 Units/hr at 12/23/24 0644   hydrALAZINE  (APRESOLINE ) injection 5 mg  5 mg Intravenous Q6H PRN Laurita Manor T, MD       HYDROmorphone  (DILAUDID ) injection 0.5-1 mg  0.5-1 mg Intravenous Q4H PRN Laurita Manor T, MD       insulin  aspart (novoLOG ) injection 0-9 Units  0-9 Units Subcutaneous TID WC Laurita Manor DASEN, MD   1 Units at 12/23/24 1307   levothyroxine  (SYNTHROID ) tablet 50 mcg  50 mcg Oral QODAY Zhang, Ping T, MD   50 mcg at 12/22/24 1712   levothyroxine  (SYNTHROID ) tablet 75 mcg  75 mcg Oral QODAY Greenwood, Howard F, RPH   75 mcg at 12/23/24 9387   LORazepam  (ATIVAN ) injection 0.5 mg  0.5 mg Intravenous Q6H PRN Laurita Manor T, MD       mirtazapine  (REMERON ) tablet 7.5 mg  7.5 mg Oral QHS Laurita Manor T, MD   7.5 mg at 12/22/24 2300   ondansetron  (ZOFRAN ) tablet 4 mg  4 mg Oral Q6H PRN Laurita Manor DASEN, MD       Or   ondansetron  (ZOFRAN ) injection 4 mg  4 mg Intravenous Q6H PRN Laurita Manor DASEN, MD  pantoprazole  (PROTONIX ) EC tablet 40 mg  40 mg Oral Daily Zhang, Ping T, MD   40 mg at 12/23/24 9068   piperacillin -tazobactam (ZOSYN ) IVPB 3.375 g  3.375 g Intravenous Q8H [place]OCK[place], [place] 12.5 mL/hr at 12/23/24 0721 3.375 g at 12/23/24 9278   polyethylene glycol (MIRALAX  / GLYCOLAX ) packet 17 g  17 g Oral Daily PRN Laurita Cort DASEN, MD       vancomycin  LUCRECIA) IVPB 1250 mg/250 mL  1,250 mg Intravenous Once Claudene Rover, MD   Held at 12/22/24 1402    Past Medical History:  Diagnosis Date   Cancer [facility])    skin   Coronary artery disease    Diabetes mellitus without complication (HCC)    Hypertension    Stroke [facility])     Past Surgical History:  Procedure Laterality Date   ABDOMINAL HYSTERECTOMY     IR ANGIO INTRA EXTRACRAN SEL INTERNAL CAROTID BILAT MOD SED  05/11/2021   IR ANGIO VERTEBRAL SEL VERTEBRAL UNI R MOD SED  05/11/2021    IR ANGIOGRAM EXTREMITY LEFT  05/11/2021   IR CT HEAD LTD  05/11/2021   IR INTRA CRAN STENT  05/11/2021   IR INTRA CRAN STENT  05/12/2021   IR US  GUIDE VASC ACCESS RIGHT  05/11/2021   RADIOLOGY WITH ANESTHESIA N/A 05/11/2021   Procedure: IR WITH ANESTHESIA - INTRACRANIAL STENT;  Surgeon: de Macedo Rodrigues, Katyucia, MD;  Location: [facility] OR;  Service: Radiology;  Laterality: N/A;    Social History Social History[1]  Family History Family History  Problem Relation Age of Onset   Heart disease Mother    Heart disease Father     Allergies[2]   REVIEW OF SYSTEMS (Negative unless checked)  Constitutional: [] Weight loss  [] Fever  [] Chills Cardiac: [] Chest pain   [] Chest pressure   [] Palpitations   [] Shortness of breath when laying flat   [] Shortness of breath at rest   [] Shortness of breath with exertion. Vascular:  [] Pain in legs with walking   [] Pain in legs at rest   [] Pain in legs when laying flat   [] Claudication   [] Pain in feet when walking  [] Pain in feet at rest  [] Pain in feet when laying flat   [] History of DVT   [] Phlebitis   [x] Swelling in legs   [] Varicose veins   [x] Non-healing ulcers Pulmonary:   [] Uses home oxygen   [] Productive cough   [] Hemoptysis   [] Wheeze  [] COPD   [] Asthma Neurologic:  [] Dizziness  [] Blackouts   [] Seizures   [] History of stroke   [] History of TIA  [] Aphasia   [] Temporary blindness   [] Dysphagia   [] Weakness or numbness in arms   [] Weakness or numbness in legs Musculoskeletal:  [] Arthritis   [] Joint swelling   [] Joint pain   [] Low back pain Hematologic:  [] Easy bruising  [] Easy bleeding   [] Hypercoagulable state   [] Anemic  [] Hepatitis Gastrointestinal:  [] Blood in stool   [] Vomiting blood  [] Gastroesophageal reflux/heartburn   [] Difficulty swallowing. Genitourinary:  [] Chronic kidney disease   [] Difficult urination  [] Frequent urination  [] Burning with urination   [] Blood in urine Skin:  [] Rashes   [] Ulcers   [] Wounds Psychological:  [] History of anxiety    []  History of major depression.  Physical Examination  Vitals:   12/22/24 2024 12/23/24 0421 12/23/24 0858 12/23/24 1617  BP: (!) 135/44 (!) 114/94 (!) 123/58 (!) 139/55  Pulse: 63 94 63 67  Resp: 18 18 17 17   Temp: 97.7 F (36.5 C) 97.9 F (36.6  C) 98 F (36.7 C) 98 F (36.7 C)  TempSrc:      SpO2: 98% 95% 99% 100%  Weight:      Height:       Body mass index is 22.67 kg/m. Gen:  WD/WN, NAD Head: [place]/AT, No temporalis wasting. Prominent temp pulse not noted. Ear/Nose/Throat: Hearing grossly intact, nares w/o erythema or drainage, oropharynx w/o Erythema/Exudate Eyes: Sclera non-icteric, conjunctiva clear Neck: Trachea midline.  No JVD.  Pulmonary:  Good air movement, respirations not labored, equal bilaterally.  Cardiac: RRR, normal S1, S2. Vascular: Bilateral wounds on the great toes small in nature, left heel ulceration Vessel Right Left  Radial Palpable Palpable  PT Not Palpable Not Palpable  DP Not Palpable Not Palpable   Gastrointestinal: soft, non-tender/non-distended. No guarding/reflex.  Musculoskeletal: M/S 5/5 throughout.  Extremities without ischemic changes.  No deformity or atrophy.  Neurologic: Sensation grossly intact in extremities.  Symmetrical.  Speech is fluent. Motor exam as listed above. Psychiatric: Judgment intact, Mood & affect appropriate for pt's clinical situation.     CBC Lab Results  Component Value Date   WBC 8.3 12/23/2024   HGB 7.5 (L) 12/23/2024   HCT 23.0 (L) 12/23/2024   MCV 85.2 12/23/2024   PLT 222 12/23/2024    BMET    Component Value Date/Time   NA 143 12/23/2024 0501   K 4.6 12/23/2024 0501   CL 111 12/23/2024 0501   CO2 22 12/23/2024 0501   GLUCOSE 78 12/23/2024 0501   BUN 27 (H) 12/23/2024 0501   CREATININE 1.20 (H) 12/23/2024 0501   CALCIUM  6.6 (L) 12/23/2024 0501   GFRNONAA 47 (L) 12/23/2024 0501   Estimated Creatinine Clearance: 30.6 mL/min (A) (by C-G formula based on SCr of 1.2 mg/dL (H)).  COAG Lab  Results  Component Value Date   INR 1.1 06/05/2022   INR 1.1 06/12/2021    Radiology US  ARTERIAL ABI (SCREENING LOWER EXTREMITY) Result Date: 12/22/2024 CLINICAL DATA:  Foot ulcer EXAM: NONINVASIVE PHYSIOLOGIC VASCULAR STUDY OF BILATERAL LOWER EXTREMITIES TECHNIQUE: Evaluation of both lower extremities were performed at rest, including calculation of ankle-brachial indices with single level pressure measurements and doppler recording. COMPARISON:  None available. FINDINGS: Right ABI:  0.90 Left ABI:  0.81 Right Lower Extremity:  Normal arterial waveforms at the ankle. Left Lower Extremity:  Normal arterial waveforms at the ankle. 0.8-0.89 Mild PAD IMPRESSION: Decreased ankle-brachial indices consistent with mild BILATERAL lower extremity peripheral arterial disease. Electronically Signed   By: Aliene Lloyd M.D.   On: 12/22/2024 15:11   MR ANKLE LEFT WO CONTRAST Result Date: 12/22/2024 EXAM: MRI of the left Ankle without contrast. 12/22/2024 02:16:53 PM TECHNIQUE: Multiplanar multisequence MRI of the left ankle was performed without the administration of intravenous contrast. COMPARISON: None available. CLINICAL HISTORY: Heal wound, possible osteomyelitis. FINDINGS: SYNDESMOTIC LIGAMENTS: Intact anterior inferior tibiofibular ligament and posterior inferior tibiofibular ligament. No significant periligamentous edema or interval widening. LATERAL COLLATERAL LIGAMENT COMPLEX: Intact anterior talofibular ligament, posterior talofibular ligament and calcaneofibular ligament. DELTOID LIGAMENT COMPLEX: Intact superficial and deep components. SINUS TARSI AND SPRING LIGAMENT: Visualized portions of the spring ligament are intact. Normal appearance of the sinus tarsi. MEDIAL TENDONS: Distal tibialis posterior tenosynovitis. Mild flexor hallucis longus tenosynovitis proximal to the knot of henry. Intact flexor digitorum longus tendon. LATERAL TENDONS: Longitudinal split tear of the peroneus brevis tendon adjacent to  the lateral malleolus. Intact peroneus longus tendon. ANTERIOR TENDONS: The tibialis anterior, extensor hallucis longus and extensor digitorum longus tendons are normal in position,  morphology and signal. ACHILLES TENDON: Mild distal achilles tendinopathy with faintly increased internal T2 signal and anterior convexity of the tendon. No associated bursitis. PLANTAR FASCIA: The medial and lateral bundles of the plantar fascia are normal in morphology and signal. There is no acute plantar fasciitis or tear. No plantar fascial nodules. TARSAL TUNNEL: There are no obstructing lesions within the tarsal tunnel. BONE MARROW: Trace endosteal edema along the adjacent calcaneus with demineralized cortex on image 24 series 4, suspicious for early calcaneal osteomyelitis. No acute fracture. No aggressive marrow replacing lesion. JOINT SPACES: No significant joint effusion. No significant degenerative changes. Normal alignment. SOFT TISSUES: Posterior heel wound behind the calcaneus. Subcutaneous edema circumferentially at the ankle (especially laterally) and tracking into the dorsum of the foot, cellulitis is not excluded. IMPRESSION: 1. Posterior heel wound with adjacent calcaneal cortical demineralization and trace endosteal edema, suspicious for early calcaneal osteomyelitis. 2. Circumferential ankle and dorsal foot subcutaneous edema, cellulitis not excluded. 3. Longitudinal split tear of the peroneus brevis tendon adjacent to the lateral malleolus. 4. Distal tibialis posterior tenosynovitis. 5. Mild flexor hallucis longus tenosynovitis proximal to the knot of Henry. 6. Mild distal Achilles tendinopathy. Electronically signed by: Ryan Salvage MD 12/22/2024 02:45 PM EST RP Workstation: [id number]   DG Foot Complete Left Result Date: 12/22/2024 CLINICAL DATA:  eval signs of osteo. ulcer on heel EXAM: LEFT FOOT - COMPLETE 3+ VIEW COMPARISON:  None Available. FINDINGS: There is no evidence of fracture or dislocation.  Diffuse, patchy osseous demineralization. No focal demineralization, erosion or periosteal change. There is no evidence of arthropathy or other focal bone abnormality. Soft tissues are unremarkable. IMPRESSION: 1. No acute displaced fracture, dislocation or radiographic findings of osteomyelitis within the LEFT foot. 2. Diffuse osseous demineralization, correlate for osteopenia. Electronically Signed   By: Thom Hall M.D.   On: 12/22/2024 12:49   US  Venous Img Lower Unilateral Left Result Date: 12/22/2024 CLINICAL DATA:  Left lower extremity pain and swelling EXAM: LEFT LOWER EXTREMITY VENOUS DOPPLER ULTRASOUND TECHNIQUE: Gray-scale sonography with compression, as well as color and duplex ultrasound, were performed to evaluate the deep venous system(s) from the level of the common femoral vein through the popliteal and proximal calf veins. COMPARISON:  None Available. FINDINGS: VENOUS Normal compressibility of the common femoral, superficial femoral, and popliteal veins, as well as the visualized calf veins. Visualized portions of profunda femoral vein and great saphenous vein unremarkable. No filling defects to suggest DVT on grayscale or color Doppler imaging. Doppler waveforms show normal direction of venous flow, normal respiratory plasticity and response to augmentation. Limited views of the contralateral common femoral vein are unremarkable. OTHER None. Limitations: none IMPRESSION: Negative. Electronically Signed   By: Wilkie Lent M.D.   On: 12/22/2024 12:20      Assessment/Plan 1. Peripheral arterial disease with ulceration   I spoke with the patient and the daughter and the patient prefers to be as conservative as possible at this time.  Her ABIs do indicate that she has some level of peripheral arterial disease but it is certainly possible that she may heal without intervention.  I discussed with both patient and her daughter that because she is undergoing debridement we will be able to  assess how well she bleeds during this procedure.  If her bleeding is minimal then an angiogram would likely be in her best interest however if she does have good bleeding during her procedure this is something that we can continue to follow the patient with on an outpatient  basis.  I discussed that even if she has good bleeding during her procedure if she still continues to be slow with healing her wound angiogram still may be necessary but this can be plan on a more outpatient basis.  Discussed the angiogram procedure with the patient, but we will go into more detail if it is something that she requires.  We will follow-up with the patient following her procedure.   Plan of care discussed with Dr. Marea and he is in agreement with plan noted above.   Family Communication: Spoke with Daughter Samule via Telephone    I spent 75 minutes in this encounter including personally reviewing extensive medical records, personally reviewing imaging studies and compared to prior scans, counseling the patient, placing orders, coordinating care and performing appropriate documentation  Thank you for allowing us  to participate in the care of this patient.   Kambrey Hagger E Kenzy Campoverde, NP [facility] [phone] (Office Phone) [phone] (Office Fax) [phone] (Pager)  12/23/2024 4:32 PM  Staff may message me via secure chat in Epic  but this may not receive immediate response,  please page for urgent matters!  Dictation software was used to generate the above note. Typos may occur and escape review, as with typed/written notes. Any error is purely unintentional.  Please contact me directly for clarity if needed.       [1]  Social History Tobacco Use   Smoking status: Never   Smokeless tobacco: Never  Substance Use Topics   Alcohol use: No   Drug use: No  [2]  Allergies Allergen Reactions   Augmentin [Amoxicillin-Pot Clavulanate] Other (See Comments)    No reaction listed  on MAR   Doxycycline Other (See Comments)    No reaction listed on MAR   Lisinopril  Other (See Comments)    No reaction listed on MAR   Norvasc [Amlodipine] Other (See Comments)    No reaction listed on MAR   "

## 2024-12-23 NOTE — Progress Notes (Signed)
 PODIATRY / FOOT AND ANKLE SURGERY PROGRESS NOTE  Requesting Physician: Dr. Laurita  Reason for consult: Left heel ulcer  Chief Complaint: Left heel pain/sore   HPI: Madison Davidson is a 74 y.o. female who presents today resting bed comfortably.  Patient has dressing applied to the left foot with Prevalon boot on today.  Patient is sleeping upon my entry in the room.  Patient still complains of pain in the left heel.  PMHx:  Past Medical History:  Diagnosis Date   Cancer (HCC)    skin   Coronary artery disease    Diabetes mellitus without complication (HCC)    Hypertension    Stroke [facility])     Surgical Hx:  Past Surgical History:  Procedure Laterality Date   ABDOMINAL HYSTERECTOMY     IR ANGIO INTRA EXTRACRAN SEL INTERNAL CAROTID BILAT MOD SED  05/11/2021   IR ANGIO VERTEBRAL SEL VERTEBRAL UNI R MOD SED  05/11/2021   IR ANGIOGRAM EXTREMITY LEFT  05/11/2021   IR CT HEAD LTD  05/11/2021   IR INTRA CRAN STENT  05/11/2021   IR INTRA CRAN STENT  05/12/2021   IR US  GUIDE VASC ACCESS RIGHT  05/11/2021   RADIOLOGY WITH ANESTHESIA N/A 05/11/2021   Procedure: IR WITH ANESTHESIA - INTRACRANIAL STENT;  Surgeon: de Macedo Rodrigues, Katyucia, MD;  Location: [facility] OR;  Service: Radiology;  Laterality: N/A;    FHx:  Family History  Problem Relation Age of Onset   Heart disease Mother    Heart disease Father     Social History:  reports that she has never smoked. She has never used smokeless tobacco. She reports that she does not drink alcohol and does not use drugs.  Allergies: Allergies[1]  Medications Prior to Admission  Medication Sig Dispense Refill   acetaminophen  (TYLENOL ) 325 MG tablet Take 2 tablets (650 mg total) by mouth every 6 (six) hours as needed for mild pain, fever or headache.     amiodarone  (PACERONE ) 200 MG tablet Take 1 tablet (200 mg total) by mouth 2 (two) times daily.     apixaban  (ELIQUIS ) 5 MG TABS tablet Take 1 tablet (5 mg total) by mouth 2 (two) times daily. 60  tablet 3   atorvastatin  (LIPITOR ) 40 MG tablet Take 1 tablet (40 mg total) by mouth daily.     divalproex  (DEPAKOTE ) 125 MG DR tablet Take 125 mg by mouth 2 (two) times daily.     famotidine  (PEPCID ) 20 MG tablet Take 20 mg by mouth at bedtime.     levothyroxine  (SYNTHROID ) 50 MCG tablet Take 50 mcg by mouth every other day.     levothyroxine  (SYNTHROID ) 75 MCG tablet Take 75 mcg by mouth every other day.     lisinopril  (ZESTRIL ) 20 MG tablet Take 1 tablet (20 mg total) by mouth daily. 30 tablet 0   melatonin 3 MG TABS tablet Take 2 tablets (6 mg total) by mouth at bedtime as needed.     metFORMIN  (GLUCOPHAGE ) 1000 MG tablet Take 1 tablet (1,000 mg total) by mouth 2 (two) times daily with a meal. 60 tablet 0   mirtazapine  (REMERON ) 7.5 MG tablet Take 7.5 mg by mouth at bedtime.     omeprazole (PRILOSEC) 40 MG capsule Take 40 mg by mouth daily.     ondansetron  (ZOFRAN ) 4 MG tablet Take 4 mg by mouth every 6 (six) hours as needed for nausea or vomiting.     polyethylene glycol (MIRALAX  / GLYCOLAX ) 17 g packet Take 17  g by mouth daily as needed for mild constipation.     cholecalciferol (VITAMIN D3) 25 MCG (1000 UNIT) tablet Take 1,000 Units by mouth daily. (Patient not taking: Reported on 12/22/2024)     ferrous sulfate  325 (65 FE) MG tablet Take 325 mg by mouth every Monday, Wednesday, and Friday. (Patient not taking: Reported on 12/22/2024)     folic acid  (FOLVITE ) 1 MG tablet Take 1 tablet (1 mg total) by mouth daily. (Patient not taking: Reported on 12/22/2024)     vitamin B-12 (VITAMIN B12) 500 MCG tablet Take 1 tablet (500 mcg total) by mouth daily. (Patient not taking: Reported on 12/22/2024)      Physical Exam: General: Alert and oriented.  No apparent distress.  Vascular: DP/PT pulses +1 bilateral, capillary fill time appears to be intact to digits, no hair growth present to bilateral lower extremities.  Neuro: Light touch sensation intact bilateral lower extremities.  Derm: Stable  eschar present to the posterior aspect left heel with minimal surrounding erythema or edema, no significant drainage, appears to be very dry.    Small stable eschar present to the distal tip of the right hallux.    MSK: Pain on palpation left posterior heel.  Results for orders placed or performed during the hospital encounter of 12/22/24 (from the past 48 hours)  CBC     Status: Abnormal   Collection Time: 12/22/24 11:39 AM  Result Value Ref Range   WBC 11.0 (H) 4.0 - 10.5 K/uL   RBC 3.44 (L) 3.87 - 5.11 MIL/uL   Hemoglobin 9.3 (L) 12.0 - 15.0 g/dL   HCT 69.9 (L) 63.9 - 53.9 %   MCV 87.2 80.0 - 100.0 fL   MCH 27.0 26.0 - 34.0 pg   MCHC 31.0 30.0 - 36.0 g/dL   RDW 84.3 (H) 88.4 - 84.4 %   Platelets 269 150 - 400 K/uL   nRBC 0.0 0.0 - 0.2 %    Comment: Performed at [facility], [address], [place], [place] [address]  Basic metabolic panel     Status: Abnormal   Collection Time: 12/22/24 11:39 AM  Result Value Ref Range   Sodium 143 135 - 145 mmol/L   Potassium 5.3 (H) 3.5 - 5.1 mmol/L   Chloride 107 98 - 111 mmol/L   CO2 21 (L) 22 - 32 mmol/L   Glucose, Bld 91 70 - 99 mg/dL    Comment: Glucose reference range applies only to samples taken after fasting for at least 8 hours.   BUN 32 (H) 8 - 23 mg/dL   Creatinine, Ser 8.48 (H) 0.44 - 1.00 mg/dL   Calcium  7.8 (L) 8.9 - 10.3 mg/dL   GFR, Estimated 36 (L) >60 mL/min    Comment: (NOTE) Calculated using the CKD-EPI Creatinine Equation (2021)    Anion gap 16 (H) 5 - 15    Comment: Performed at [facility], [address] [place], [place] [address]  MRSA Next Gen by PCR, Nasal     Status: None   Collection Time: 12/22/24  2:02 PM   Specimen: Nasal Mucosa; Nasal Swab  Result Value Ref Range   MRSA by PCR Next Gen NOT DETECTED NOT DETECTED    Comment: (NOTE) The GeneXpert MRSA Assay (FDA approved for NASAL specimens only), is one component of a comprehensive MRSA colonization surveillance program. It  is not intended to diagnose MRSA infection nor to guide or monitor treatment for MRSA infections. Test performance is not FDA approved in  patients less than 58 years old. Performed at [facility], [address] [place], [place] [address]   Lactic acid, plasma     Status: Abnormal   Collection Time: 12/22/24  2:02 PM  Result Value Ref Range   Lactic Acid, Venous 4.3 (HH) 0.5 - 1.9 mmol/L    Comment: Critical Value, Read Back and verified with [facility] 12/22/24 1437 MW Performed at [facility], [address] [place], [place] [address]   Glucose, capillary     Status: Abnormal   Collection Time: 12/22/24  5:04 PM  Result Value Ref Range   Glucose-Capillary 58 (L) 70 - 99 mg/dL    Comment: Glucose reference range applies only to samples taken after fasting for at least 8 hours.  Glucose, capillary     Status: Abnormal   Collection Time: 12/22/24  5:33 PM  Result Value Ref Range   Glucose-Capillary 60 (L) 70 - 99 mg/dL    Comment: Glucose reference range applies only to samples taken after fasting for at least 8 hours.  Sedimentation rate     Status: Abnormal   Collection Time: 12/22/24  6:25 PM  Result Value Ref Range   Sed Rate 42 (H) 0 - 30 mm/hr    Comment: Performed at [facility], [address] [place], [place] [address]  C-reactive protein     Status: None   Collection Time: 12/22/24  6:25 PM  Result Value Ref Range   CRP 0.8 <1.0 mg/dL    Comment: Performed at [facility], [address] [place], [place] [address]  Glucose, capillary     Status: Abnormal   Collection Time: 12/22/24 10:15 PM  Result Value Ref Range   Glucose-Capillary 172 (H) 70 - 99 mg/dL    Comment: Glucose reference range applies only to samples taken after fasting for at least 8 hours.  Basic metabolic panel     Status: Abnormal   Collection Time: 12/23/24  5:01 AM  Result Value Ref Range   Sodium 143 135 - 145 mmol/L   Potassium 4.6 3.5 - 5.1  mmol/L   Chloride 111 98 - 111 mmol/L   CO2 22 22 - 32 mmol/L   Glucose, Bld 78 70 - 99 mg/dL    Comment: Glucose reference range applies only to samples taken after fasting for at least 8 hours.   BUN 27 (H) 8 - 23 mg/dL   Creatinine, Ser 8.79 (H) 0.44 - 1.00 mg/dL   Calcium  6.6 (L) 8.9 - 10.3 mg/dL   GFR, Estimated 47 (L) >60 mL/min    Comment: (NOTE) Calculated using the CKD-EPI Creatinine Equation (2021)    Anion gap 9 5 - 15    Comment: Performed at [facility], [address] [place], [place] [address]  CBC     Status: Abnormal   Collection Time: 12/23/24  5:01 AM  Result Value Ref Range   WBC 8.3 4.0 - 10.5 K/uL   RBC 2.70 (L) 3.87 - 5.11 MIL/uL   Hemoglobin 7.5 (L) 12.0 - 15.0 g/dL   HCT 76.9 (L) 63.9 - 53.9 %   MCV 85.2 80.0 - 100.0 fL   MCH 27.8 26.0 - 34.0 pg   MCHC 32.6 30.0 - 36.0 g/dL   RDW 84.1 (H) 88.4 - 84.4 %   Platelets 222 150 - 400 K/uL   nRBC 0.0 0.0 - 0.2 %    Comment: Performed at [facility], [address] [place],  [place] [address]  Glucose, capillary     Status: None   Collection Time: 12/23/24  8:04 AM  Result Value Ref Range   Glucose-Capillary 71 70 - 99 mg/dL    Comment: Glucose reference range applies only to samples taken after fasting for at least 8 hours.  APTT     Status: Abnormal   Collection Time: 12/23/24  8:05 AM  Result Value Ref Range   aPTT 92 (H) 24 - 36 seconds    Comment:        IF BASELINE aPTT IS ELEVATED, SUGGEST PATIENT RISK ASSESSMENT BE USED TO DETERMINE APPROPRIATE ANTICOAGULANT THERAPY. Performed at [facility], [address] [place], [place] [address]   Heparin  level (unfractionated)     Status: Abnormal   Collection Time: 12/23/24  8:05 AM  Result Value Ref Range   Heparin  Unfractionated >1.10 (H) 0.30 - 0.70 IU/mL    Comment: (NOTE) The clinical reportable range upper limit is being lowered to >1.10 to align with the FDA approved guidance for the current  laboratory assay.  If heparin  results are below expected values, and patient dosage has  been confirmed, suggest follow up testing of antithrombin III levels. Performed at [facility], [address] [place], [place] [address]   Glucose, capillary     Status: Abnormal   Collection Time: 12/23/24 11:28 AM  Result Value Ref Range   Glucose-Capillary 122 (H) 70 - 99 mg/dL    Comment: Glucose reference range applies only to samples taken after fasting for at least 8 hours.   US  ARTERIAL ABI (SCREENING LOWER EXTREMITY) Result Date: 12/22/2024 CLINICAL DATA:  Foot ulcer EXAM: NONINVASIVE PHYSIOLOGIC VASCULAR STUDY OF BILATERAL LOWER EXTREMITIES TECHNIQUE: Evaluation of both lower extremities were performed at rest, including calculation of ankle-brachial indices with single level pressure measurements and doppler recording. COMPARISON:  None available. FINDINGS: Right ABI:  0.90 Left ABI:  0.81 Right Lower Extremity:  Normal arterial waveforms at the ankle. Left Lower Extremity:  Normal arterial waveforms at the ankle. 0.8-0.89 Mild PAD IMPRESSION: Decreased ankle-brachial indices consistent with mild BILATERAL lower extremity peripheral arterial disease. Electronically Signed   By: Aliene Lloyd M.D.   On: 12/22/2024 15:11   MR ANKLE LEFT WO CONTRAST Result Date: 12/22/2024 EXAM: MRI of the left Ankle without contrast. 12/22/2024 02:16:53 PM TECHNIQUE: Multiplanar multisequence MRI of the left ankle was performed without the administration of intravenous contrast. COMPARISON: None available. CLINICAL HISTORY: Heal wound, possible osteomyelitis. FINDINGS: SYNDESMOTIC LIGAMENTS: Intact anterior inferior tibiofibular ligament and posterior inferior tibiofibular ligament. No significant periligamentous edema or interval widening. LATERAL COLLATERAL LIGAMENT COMPLEX: Intact anterior talofibular ligament, posterior talofibular ligament and calcaneofibular ligament. DELTOID LIGAMENT COMPLEX: Intact  superficial and deep components. SINUS TARSI AND SPRING LIGAMENT: Visualized portions of the spring ligament are intact. Normal appearance of the sinus tarsi. MEDIAL TENDONS: Distal tibialis posterior tenosynovitis. Mild flexor hallucis longus tenosynovitis proximal to the knot of henry. Intact flexor digitorum longus tendon. LATERAL TENDONS: Longitudinal split tear of the peroneus brevis tendon adjacent to the lateral malleolus. Intact peroneus longus tendon. ANTERIOR TENDONS: The tibialis anterior, extensor hallucis longus and extensor digitorum longus tendons are normal in position, morphology and signal. ACHILLES TENDON: Mild distal achilles tendinopathy with faintly increased internal T2 signal and anterior convexity of the tendon. No associated bursitis. PLANTAR FASCIA: The medial and lateral bundles of the plantar fascia are normal in morphology and signal. There is no acute plantar fasciitis or tear. No plantar fascial nodules. TARSAL TUNNEL:  There are no obstructing lesions within the tarsal tunnel. BONE MARROW: Trace endosteal edema along the adjacent calcaneus with demineralized cortex on image 24 series 4, suspicious for early calcaneal osteomyelitis. No acute fracture. No aggressive marrow replacing lesion. JOINT SPACES: No significant joint effusion. No significant degenerative changes. Normal alignment. SOFT TISSUES: Posterior heel wound behind the calcaneus. Subcutaneous edema circumferentially at the ankle (especially laterally) and tracking into the dorsum of the foot, cellulitis is not excluded. IMPRESSION: 1. Posterior heel wound with adjacent calcaneal cortical demineralization and trace endosteal edema, suspicious for early calcaneal osteomyelitis. 2. Circumferential ankle and dorsal foot subcutaneous edema, cellulitis not excluded. 3. Longitudinal split tear of the peroneus brevis tendon adjacent to the lateral malleolus. 4. Distal tibialis posterior tenosynovitis. 5. Mild flexor hallucis  longus tenosynovitis proximal to the knot of Henry. 6. Mild distal Achilles tendinopathy. Electronically signed by: Ryan Salvage MD 12/22/2024 02:45 PM EST RP Workstation: [id number]   DG Foot Complete Left Result Date: 12/22/2024 CLINICAL DATA:  eval signs of osteo. ulcer on heel EXAM: LEFT FOOT - COMPLETE 3+ VIEW COMPARISON:  None Available. FINDINGS: There is no evidence of fracture or dislocation. Diffuse, patchy osseous demineralization. No focal demineralization, erosion or periosteal change. There is no evidence of arthropathy or other focal bone abnormality. Soft tissues are unremarkable. IMPRESSION: 1. No acute displaced fracture, dislocation or radiographic findings of osteomyelitis within the LEFT foot. 2. Diffuse osseous demineralization, correlate for osteopenia. Electronically Signed   By: Thom Hall M.D.   On: 12/22/2024 12:49   US  Venous Img Lower Unilateral Left Result Date: 12/22/2024 CLINICAL DATA:  Left lower extremity pain and swelling EXAM: LEFT LOWER EXTREMITY VENOUS DOPPLER ULTRASOUND TECHNIQUE: Gray-scale sonography with compression, as well as color and duplex ultrasound, were performed to evaluate the deep venous system(s) from the level of the common femoral vein through the popliteal and proximal calf veins. COMPARISON:  None Available. FINDINGS: VENOUS Normal compressibility of the common femoral, superficial femoral, and popliteal veins, as well as the visualized calf veins. Visualized portions of profunda femoral vein and great saphenous vein unremarkable. No filling defects to suggest DVT on grayscale or color Doppler imaging. Doppler waveforms show normal direction of venous flow, normal respiratory plasticity and response to augmentation. Limited views of the contralateral common femoral vein are unremarkable. OTHER None. Limitations: none IMPRESSION: Negative. Electronically Signed   By: Wilkie Lent M.D.   On: 12/22/2024 12:20    Blood pressure (!) 123/58,  pulse 63, temperature 98 F (36.7 C), resp. rate 17, height 5' 1 (1.549 m), weight 54.4 kg, SpO2 99%.  Assessment Left posterior heel pressure ulceration, stable eschar Right hallux stable eschar  Plan - Patient seen and examined - X-ray and MRI imaging reviewed.  Do not believe osteomyelitis present.  Very minimal inflammatory changes at the posterior heel which more so could be related to Achilles spur that is present which is chronic pathology. -ESR slightly elevated, CRP within normal range.  Do not believe osteomyelitis is likely present based on lab work as well as clinical presentation. -Performed bedside debridement today with 15 blade without incident as well as sterile pickups and scissors.  Patient did not tolerate this well due to pain.  Was not able to remove a significant amount of tissue today. - Discussed with family and patient that performing enzymatic debridement may work but it may take a very significant period of time for this eschar to loosen up.  Patient has extreme pain with any touch to the  area so makes it difficult to perform any type of debridement in the outpatient or clinical setting. -All treatment options were discussed with the patient of both conservative and surgical attempts at correction including potential risks and complications.  Patient has elected for procedure consisting of left heel wound debridement with possible application of skin substitute and wound VAC.  No guarantees given.  Consent obtained. - Appreciate medicine recommendations for antibiotic therapy.  Will plan on taking some deep cultures at the time of the surgery tomorrow. - Reapplied soft dressing to the left heel with Betadine paint and applied Prevalon boot.  Continue daily changes for now. - Patient to be n.p.o. at around 4 AM for surgery tomorrow, plan on surgery sometime around 3 to 4 PM.   Prentice Lee, DPM 12/23/2024, 2:21 PM          [1]  Allergies Allergen Reactions    Augmentin [Amoxicillin-Pot Clavulanate] Other (See Comments)    No reaction listed on MAR   Doxycycline Other (See Comments)    No reaction listed on MAR   Lisinopril  Other (See Comments)    No reaction listed on MAR   Norvasc [Amlodipine] Other (See Comments)    No reaction listed on [facility]

## 2024-12-23 NOTE — Progress Notes (Signed)
 " Progress Note   Patient: Madison Davidson FMW:[id number] DOB: 19-Nov-1951 DOA: 12/22/2024     0 DOS: the patient was seen and examined on 12/23/2024   Brief hospital course: Madison Davidson is a 74 y.o. female with medical history significant of right MCA CVA status post stenting, PAF on Eliquis , HTN, CAD, HTN, hypothyroidism, presented with worsening of left heel wound and dehydration.   History was provided by daughter at bedside.  Patient has had intermittent left leg pain since sometime earlier last year, gradually her ambulation significantly affected and she has become wheelchair-bound for 6+ months.  2 months ago she developed left heel wound, began with a bloody blister, at some point it ruptured and patient started develop a ulcer with callus on top of it.  Daughter went to see patient on the weekend and noticed the patient developed severe pain and swelling of left heel which was new.  Patient was evaluated by vascular surgeon who ordered outpatient ABI study scheduled for next week.  In addition, daughter also noticed patient p.o. intake has significant decreased over the weekend and appeared to be very dehydrated today which prompted her to send patient to ED.   ED Course: Afebrile, nontachycardic blood pressure 128/51.  X-ray showed no signs of osteomyelitis of left foot, blood work showed WBC 11.0 hemoglobin 9.3 BUN 32 creatinine 1.5 compared to baseline less than 1.0, K5.3.   Patient was given IV bolus x 1 and started on vancomycin  and Zosyn  in the ED.    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   I discussed with podiatrist Dr. Lennie at bedside this morning and patient is being planned for debridement tomorrow Continue as needed pain medication   AKI Hyperkalemia -Likely prerenal secondary to  dehydration, continue IV fluid - 1 dose Lokelma   PAF - In sinus rhythm - Hold off Eliquis  for possible incoming podiatry intervention   CVA -start ASA - Continue statin   Hypothyroidism - Continue Synthroid    IIDM - Continue metformin  - SSI   DVT prophylaxis: Heparin  subcu Code Status: DNR/DNI Family Communication: Daughter at bedside Disposition Plan: Pending podiatry procedure Consults called: Podiatry Admission status: MedSurg observation    Subjective:  Patient seen and examined at bedside this morning Podiatry planning debridement tomorrow Denies nausea vomiting and pain chest pain cough  Physical Exam: General: Seen and examined in no acute distress Respiratory: clear to auscultation bilaterally, no wheezing, no crackles. Normal respiratory effort. No accessory muscle use.  Cardiovascular: Regular rate and rhythm, no murmurs / rubs / gallops. No extremity edema. 2+ pedal pulses. No carotid bruits.  Abdomen: no tenderness, no masses palpated. No hepatosplenomegaly. Bowel sounds positive.  Musculoskeletal: no clubbing / cyanosis. No joint deformity upper and lower extremities. Good ROM, no contractures. Normal muscle tone.  Skin: Necrotic tissue noted to the plantar surface of the left heel Neurologic: CN 2-12 grossly intact. Sensation intact, DTR normal. Strength 5/5 in all 4.  Psychiatric: Normal judgment and insight. Alert and oriented x 3. Normal mood.    Vitals:   12/22/24 2024 12/23/24 0421 12/23/24 0858 12/23/24 1617  BP: (!) 135/44 (!) 114/94 (!) 123/58 (!) 139/55  Pulse: 63 94 63 67  Resp: 18 18 17 17   Temp: 97.7 F (36.5 C) 97.9 F (36.6 C) 98 F (36.7 C) 98 F (36.7  C)  TempSrc:      SpO2: 98% 95% 99% 100%  Weight:      Height:        Data Reviewed:  MRI of the left leg showing findings of distal tibialis posterior tenosynovitis as well as posterior heel findings suspicious for calcaneal osteomyelitis    Latest Ref Rng & Units 12/23/2024     5:01 AM 12/22/2024   11:39 AM 10/22/2024    5:08 AM  CBC  WBC 4.0 - 10.5 K/uL 8.3  11.0  6.8   Hemoglobin 12.0 - 15.0 g/dL 7.5  9.3  9.1   Hematocrit 36.0 - 46.0 % 23.0  30.0  29.9   Platelets 150 - 400 K/uL 222  269  309        Latest Ref Rng & Units 12/23/2024    5:01 AM 12/22/2024   11:39 AM 10/28/2024    3:23 AM  BMP  Glucose 70 - 99 mg/dL 78  91  893   BUN 8 - 23 mg/dL 27  32  9   Creatinine 0.44 - 1.00 mg/dL 8.79  8.48  9.18   Sodium 135 - 145 mmol/L 143  143  140   Potassium 3.5 - 5.1 mmol/L 4.6  5.3  4.2   Chloride 98 - 111 mmol/L 111  107  105   CO2 22 - 32 mmol/L 22  21  27    Calcium  8.9 - 10.3 mg/dL 6.6  7.8  8.1       Author: Drue ONEIDA Potter, MD 12/23/2024 6:27 PM  For on call review [email or web address].  "

## 2024-12-24 ENCOUNTER — Inpatient Hospital Stay: Admitting: Anesthesiology

## 2024-12-24 ENCOUNTER — Encounter: Admission: EM | Disposition: A | Payer: Self-pay | Source: Home / Self Care | Attending: Internal Medicine

## 2024-12-24 ENCOUNTER — Encounter: Payer: Self-pay | Admitting: Internal Medicine

## 2024-12-24 DIAGNOSIS — L97529 Non-pressure chronic ulcer of other part of left foot with unspecified severity: Secondary | ICD-10-CM | POA: Diagnosis not present

## 2024-12-24 LAB — CBC
HCT: 23.7 % — ABNORMAL LOW (ref 36.0–46.0)
Hemoglobin: 7.3 g/dL — ABNORMAL LOW (ref 12.0–15.0)
MCH: 26.9 pg (ref 26.0–34.0)
MCHC: 30.8 g/dL (ref 30.0–36.0)
MCV: 87.5 fL (ref 80.0–100.0)
Platelets: 223 10*3/uL (ref 150–400)
RBC: 2.71 MIL/uL — ABNORMAL LOW (ref 3.87–5.11)
RDW: 15.7 % — ABNORMAL HIGH (ref 11.5–15.5)
WBC: 8.7 10*3/uL (ref 4.0–10.5)
nRBC: 0 % (ref 0.0–0.2)

## 2024-12-24 LAB — APTT
aPTT: 118 s — ABNORMAL HIGH (ref 24–36)
aPTT: 88 s — ABNORMAL HIGH (ref 24–36)

## 2024-12-24 LAB — BASIC METABOLIC PANEL WITH GFR
Anion gap: 11 (ref 5–15)
BUN: 20 mg/dL (ref 8–23)
CO2: 21 mmol/L — ABNORMAL LOW (ref 22–32)
Calcium: 6.6 mg/dL — ABNORMAL LOW (ref 8.9–10.3)
Chloride: 110 mmol/L (ref 98–111)
Creatinine, Ser: 1.12 mg/dL — ABNORMAL HIGH (ref 0.44–1.00)
GFR, Estimated: 51 mL/min — ABNORMAL LOW
Glucose, Bld: 113 mg/dL — ABNORMAL HIGH (ref 70–99)
Potassium: 5 mmol/L (ref 3.5–5.1)
Sodium: 142 mmol/L (ref 135–145)

## 2024-12-24 LAB — GLUCOSE, CAPILLARY
Glucose-Capillary: 111 mg/dL — ABNORMAL HIGH (ref 70–99)
Glucose-Capillary: 113 mg/dL — ABNORMAL HIGH (ref 70–99)
Glucose-Capillary: 121 mg/dL — ABNORMAL HIGH (ref 70–99)
Glucose-Capillary: 154 mg/dL — ABNORMAL HIGH (ref 70–99)
Glucose-Capillary: 256 mg/dL — ABNORMAL HIGH (ref 70–99)
Glucose-Capillary: 72 mg/dL (ref 70–99)
Glucose-Capillary: 78 mg/dL (ref 70–99)
Glucose-Capillary: 85 mg/dL (ref 70–99)

## 2024-12-24 LAB — HEPARIN LEVEL (UNFRACTIONATED): Heparin Unfractionated: >1.1 [IU]/mL — ABNORMAL HIGH (ref 0.30–0.70)

## 2024-12-24 MED ORDER — LIDOCAINE HCL (CARDIAC) PF 100 MG/5ML IV SOSY
PREFILLED_SYRINGE | INTRAVENOUS | Status: DC | PRN
  Administered 2024-12-24: 40 mg via INTRAVENOUS

## 2024-12-24 MED ORDER — OXYCODONE HCL 5 MG/5ML PO SOLN: 5.0000 mg | Freq: Once | ORAL | Status: DC | PRN

## 2024-12-24 MED ORDER — ACETAMINOPHEN 10 MG/ML IV SOLN: 1000.0000 mg | Freq: Once | INTRAVENOUS | Status: DC | PRN

## 2024-12-24 MED ORDER — LIDOCAINE HCL (PF) 1 % IJ SOLN
INTRAMUSCULAR | Status: AC
  Filled 2024-12-24: qty 30

## 2024-12-24 MED ORDER — BISACODYL 5 MG PO TBEC
5.0000 mg | DELAYED_RELEASE_TABLET | Freq: Every day | ORAL | Status: DC
  Administered 2024-12-26 – 2024-12-30 (×4): 5 mg via ORAL
  Filled 2024-12-24 (×5): qty 1

## 2024-12-24 MED ORDER — BUPIVACAINE HCL (PF) 0.5 % IJ SOLN
INTRAMUSCULAR | Status: DC | PRN
  Administered 2024-12-24: 8 mL

## 2024-12-24 MED ORDER — EPHEDRINE SULFATE-NACL 50-0.9 MG/10ML-% IV SOSY
PREFILLED_SYRINGE | INTRAVENOUS | Status: DC | PRN
  Administered 2024-12-24: 10 mg via INTRAVENOUS

## 2024-12-24 MED ORDER — SUGAMMADEX SODIUM 200 MG/2ML IV SOLN
INTRAVENOUS | Status: DC | PRN
  Administered 2024-12-24: 200 mg via INTRAVENOUS

## 2024-12-24 MED ORDER — DEXAMETHASONE SOD PHOSPHATE PF 10 MG/ML IJ SOLN
INTRAMUSCULAR | Status: DC | PRN
  Administered 2024-12-24: 6 mg via INTRAVENOUS

## 2024-12-24 MED ORDER — DEXTROSE 50 % IV SOLN: 50.0000 mL | Freq: Four times a day (QID) | INTRAVENOUS | Status: DC | PRN

## 2024-12-24 MED ORDER — HYDROCODONE-ACETAMINOPHEN 5-325 MG PO TABS
1.0000 | ORAL_TABLET | Freq: Four times a day (QID) | ORAL | Status: DC | PRN
  Administered 2024-12-25 – 2024-12-28 (×3): 1 via ORAL
  Filled 2024-12-24 (×3): qty 1

## 2024-12-24 MED ORDER — 0.9 % SODIUM CHLORIDE (POUR BTL) OPTIME
TOPICAL | Status: DC | PRN
  Administered 2024-12-24: 500 mL

## 2024-12-24 MED ORDER — PHENYLEPHRINE 80 MCG/ML (10ML) SYRINGE FOR IV PUSH (FOR BLOOD PRESSURE SUPPORT)
PREFILLED_SYRINGE | INTRAVENOUS | Status: DC | PRN
  Administered 2024-12-24 (×6): 80 ug via INTRAVENOUS

## 2024-12-24 MED ORDER — ONDANSETRON HCL 4 MG/2ML IJ SOLN
INTRAMUSCULAR | Status: DC | PRN
  Administered 2024-12-24: 4 mg via INTRAVENOUS

## 2024-12-24 MED ORDER — SODIUM CHLORIDE 0.9 % IV SOLN: INTRAVENOUS | Status: DC | PRN

## 2024-12-24 MED ORDER — DEXTROSE 50 % IV SOLN
50.0000 mL | Freq: Once | INTRAVENOUS | Status: AC
  Administered 2024-12-24: 50 mL via INTRAVENOUS
  Filled 2024-12-24: qty 50

## 2024-12-24 MED ORDER — LIDOCAINE-EPINEPHRINE 1 %-1:100000 IJ SOLN
INTRAMUSCULAR | Status: DC | PRN
  Administered 2024-12-24: 8 mL

## 2024-12-24 MED ORDER — OXYCODONE HCL 5 MG PO TABS: 5.0000 mg | ORAL_TABLET | Freq: Once | ORAL | Status: DC | PRN

## 2024-12-24 MED ORDER — POLYETHYLENE GLYCOL 3350 17 G PO PACK
17.0000 g | PACK | Freq: Every day | ORAL | Status: AC
  Administered 2024-12-26 – 2024-12-30 (×4): 17 g via ORAL
  Filled 2024-12-24 (×5): qty 1

## 2024-12-24 MED ORDER — FENTANYL CITRATE (PF) 100 MCG/2ML IJ SOLN
INTRAMUSCULAR | Status: AC
  Filled 2024-12-24: qty 2

## 2024-12-24 MED ORDER — BUPIVACAINE HCL (PF) 0.5 % IJ SOLN
INTRAMUSCULAR | Status: AC
  Filled 2024-12-24: qty 30

## 2024-12-24 MED ORDER — LACTATED RINGERS IV SOLN: INTRAVENOUS | Status: DC

## 2024-12-24 MED ORDER — FENTANYL CITRATE (PF) 100 MCG/2ML IJ SOLN: 25.0000 ug | INTRAMUSCULAR | Status: DC | PRN

## 2024-12-24 MED ORDER — HEPARIN (PORCINE) 25000 UT/250ML-% IV SOLN
650.0000 [IU]/h | INTRAVENOUS | Status: DC
  Administered 2024-12-24 – 2024-12-26 (×2): 650 [IU]/h via INTRAVENOUS
  Filled 2024-12-24: qty 250

## 2024-12-24 MED ORDER — LIDOCAINE-EPINEPHRINE 1 %-1:100000 IJ SOLN
INTRAMUSCULAR | Status: AC
  Filled 2024-12-24: qty 20

## 2024-12-24 MED ORDER — ROCURONIUM BROMIDE 100 MG/10ML IV SOLN
INTRAVENOUS | Status: DC | PRN
  Administered 2024-12-24: 40 mg via INTRAVENOUS

## 2024-12-24 MED ORDER — FENTANYL CITRATE (PF) 100 MCG/2ML IJ SOLN
INTRAMUSCULAR | Status: DC | PRN
  Administered 2024-12-24: 50 ug via INTRAVENOUS

## 2024-12-24 NOTE — Anesthesia Postprocedure Evaluation (Signed)
"   Anesthesia Post Note  Patient: Madison Davidson  Procedure(s) Performed: IRRIGATION AND DEBRIDEMENT WOUND (Left) APPLICATION, WOUND VAC (Left) GRAFT APPLICATION  Patient location during evaluation: PACU Anesthesia Type: General Level of consciousness: awake and alert Pain management: pain level controlled Vital Signs Assessment: post-procedure vital signs reviewed and stable Respiratory status: spontaneous breathing, nonlabored ventilation, respiratory function stable and patient connected to nasal cannula oxygen Cardiovascular status: blood pressure returned to baseline and stable Postop Assessment: no apparent nausea or vomiting Anesthetic complications: no   No notable events documented.   Last Vitals:  Vitals:   12/24/24 1745 12/24/24 1800  BP: 135/75 (!) 141/43  Pulse: 70 70  Resp: 18 (!) 8  Temp:    SpO2: 97% 97%    Last Pain:  Vitals:   12/24/24 1745  TempSrc:   PainSc: 0-No pain                 Madison Davidson      "

## 2024-12-24 NOTE — Anesthesia Procedure Notes (Signed)
 Procedure Name: Intubation Date/Time: 12/24/2024 4:17 PM  Performed by: Rosine Shona Jansky, CRNAPre-anesthesia Checklist: Patient identified, Emergency Drugs available, Suction available and Patient being monitored Patient Re-evaluated:Patient Re-evaluated prior to induction Oxygen Delivery Method: Circle system utilized Preoxygenation: Pre-oxygenation with 100% oxygen Induction Type: IV induction Ventilation: Mask ventilation without difficulty Laryngoscope Size: McGrath and 3 Grade View: Grade I Tube type: Oral Tube size: 7.0 mm Number of attempts: 1 Airway Equipment and Method: Stylet and Oral airway Placement Confirmation: ETT inserted through vocal cords under direct vision, positive ETCO2 and breath sounds checked- equal and bilateral Secured at: 21 cm Tube secured with: Tape Dental Injury: Teeth and Oropharynx as per pre-operative assessment

## 2024-12-24 NOTE — Progress Notes (Signed)
 " Progress Note   Patient: Madison Davidson FMW:[id number] DOB: Jul 16, 1951 DOA: 12/22/2024     0 DOS: the patient was seen and examined on 12/24/2024     Brief hospital course: From HPI Juan A Wambolt is a 74 y.o. female with medical history significant of right MCA CVA status post stenting, PAF on Eliquis , HTN, CAD, HTN, hypothyroidism, presented with worsening of left heel wound and dehydration.   History was provided by daughter at bedside.  Patient has had intermittent left leg pain since sometime earlier last year, gradually her ambulation significantly affected and she has become wheelchair-bound for 6+ months.  2 months ago she developed left heel wound, began with a bloody blister, at some point it ruptured and patient started develop a ulcer with callus on top of it.  Daughter went to see patient on the weekend and noticed the patient developed severe pain and swelling of left heel which was new.  Patient was evaluated by vascular surgeon who ordered outpatient ABI study scheduled for next week.  In addition, daughter also noticed patient p.o. intake has significant decreased over the weekend and appeared to be very dehydrated today which prompted her to send patient to ED.   ED Course: Afebrile, nontachycardic blood pressure 128/51.  X-ray showed no signs of osteomyelitis of left foot, blood work showed WBC 11.0 hemoglobin 9.3 BUN 32 creatinine 1.5 compared to baseline less than 1.0, K5.3.   Patient was given IV bolus x 1 and started on vancomycin  and Zosyn  in the ED.      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 Patient being planned for debridement by podiatrist today - Continue Zosyn  Patient Continue as needed pain medication   AKI Hyperkalemia -Likely prerenal secondary to dehydration, continue IV fluid - 1 dose Lokelma   PAF -  In sinus rhythm - Hold off Eliquis  for possible incoming podiatry intervention   CVA -start ASA - Continue statin   Hypothyroidism - Continue Synthroid    IIDM - Continue metformin  - SSI   DVT prophylaxis: Heparin  subcu Code Status: DNR/DNI  Disposition Plan: Pending podiatry procedure  Consults called: Podiatry      Subjective:  Patient seen and examined at bedside this morning Podiatry planning for vascular intervention today She denied nausea vomiting or worsening foot pain   Physical Exam: General: Seen and examined in no acute distress Respiratory: clear to auscultation bilaterally, no wheezing, no crackles. Normal respiratory effort. No accessory muscle use.  Cardiovascular: Regular rate and rhythm, no murmurs / rubs / gallops. No extremity edema. 2+ pedal pulses. No carotid bruits.  Abdomen: no tenderness, no masses palpated. No hepatosplenomegaly. Bowel sounds positive.  Musculoskeletal: no clubbing / cyanosis. No joint deformity upper and lower extremities. Good ROM, no contractures. Normal muscle tone.  Skin: Necrotic tissue noted to the plantar surface of the left heel Neurologic: CN 2-12 grossly intact. Sensation intact, DTR normal. Strength 5/5 in all 4.  Psychiatric: Normal judgment and insight. Alert and oriented x 3. Normal mood.     Data Reviewed:      Latest Ref Rng & Units 12/24/2024    2:36 AM 12/23/2024    5:01 AM 12/22/2024   11:39 AM  CBC  WBC 4.0 - 10.5 K/uL 8.7  8.3  11.0   Hemoglobin 12.0 - 15.0 g/dL 7.3  7.5  9.3   Hematocrit 36.0 -  46.0 % 23.7  23.0  30.0   Platelets 150 - 400 K/uL 223  222  269        Latest Ref Rng & Units 12/24/2024    2:36 AM 12/23/2024    5:01 AM 12/22/2024   11:39 AM  BMP  Glucose 70 - 99 mg/dL 886  78  91   BUN 8 - 23 mg/dL 20  27  32   Creatinine 0.44 - 1.00 mg/dL 8.87  8.79  8.48   Sodium 135 - 145 mmol/L 142  143  143   Potassium 3.5 - 5.1 mmol/L 5.0  4.6  5.3   Chloride 98 - 111 mmol/L 110  111  107   CO2  22 - 32 mmol/L 21  22  21    Calcium  8.9 - 10.3 mg/dL 6.6  6.6  7.8      Vitals:   12/24/24 1730 12/24/24 1745 12/24/24 1800 12/24/24 1828  BP: (!) 161/92 135/75 (!) 141/43 125/69  Pulse: 76 70 70 66  Resp: 18 18 (!) 8 16  Temp:    (!) 97.5 F (36.4 C)  TempSrc:    Oral  SpO2: 100% 97% 97% 100%  Weight:      Height:       Author: Drue ONEIDA Potter, MD 12/24/2024 6:38 PM  For on call review [email or web address].  "

## 2024-12-24 NOTE — Consult Note (Signed)
 WOC team consulted for L heel NPWT dressing changes M/W/F.  Patient underwent debridement L heel wound, application of Kerecis and NPWT by Dr. Lennie 12/24/2024.    Per order L heel wound vac changes MWF; do not remove xeroform with changes as skin substitute is under the area. If it is removed, replace with xeroform   WOC team will follow as requested beginning NPWT dressing changes Monday 12/28/2024.   Thank you,    Powell Bar MSN, RN-BC, [employer]

## 2024-12-24 NOTE — Progress Notes (Signed)
 PHARMACY - ANTICOAGULATION CONSULT NOTE  Pharmacy Consult for Heparin   Indication: atrial fibrillation  Allergies[1]  Patient Measurements: Height: 5' 1 (154.9 cm) Weight: 54.4 kg (119 lb 14.9 oz) IBW/kg (Calculated) : 47.8 HEPARIN  DW (KG): 54.4  Vital Signs: Temp: 97.5 F (36.4 C) (01/29 1828) Temp Source: Oral (01/29 1828) BP: 125/69 (01/29 1828) Pulse Rate: 66 (01/29 1828)  Labs: Recent Labs    12/22/24 1139 12/23/24 0501 12/23/24 0805 12/23/24 0805 12/23/24 1612 12/24/24 0236 12/24/24 1305  HGB 9.3* 7.5*  --   --   --  7.3*  --   HCT 30.0* 23.0*  --   --   --  23.7*  --   PLT 269 222  --   --   --  223  --   APTT  --   --  92*   < > 107* 118* 88*  HEPARINUNFRC  --   --  >1.10*  --   --  >1.10*  --   CREATININE 1.51* 1.20*  --   --   --  1.12*  --    < > = values in this interval not displayed.    Estimated Creatinine Clearance: 32.7 mL/min (A) (by C-G formula based on SCr of 1.12 mg/dL (H)).   Medical History: Past Medical History:  Diagnosis Date   Cancer (HCC)    skin   Coronary artery disease    Diabetes mellitus without complication (HCC)    Hypertension    Stroke (HCC)     Medications:  Medications Prior to Admission  Medication Sig Dispense Refill Last Dose/Taking   acetaminophen  (TYLENOL ) 325 MG tablet Take 2 tablets (650 mg total) by mouth every 6 (six) hours as needed for mild pain, fever or headache.   Unknown   amiodarone  (PACERONE ) 200 MG tablet Take 1 tablet (200 mg total) by mouth 2 (two) times daily.   12/22/2024 Morning   apixaban  (ELIQUIS ) 5 MG TABS tablet Take 1 tablet (5 mg total) by mouth 2 (two) times daily. 60 tablet 3 12/22/2024 Morning   atorvastatin  (LIPITOR ) 40 MG tablet Take 1 tablet (40 mg total) by mouth daily.   12/21/2024 Bedtime   divalproex  (DEPAKOTE ) 125 MG DR tablet Take 125 mg by mouth 2 (two) times daily.   12/22/2024 Morning   famotidine  (PEPCID ) 20 MG tablet Take 20 mg by mouth at bedtime.   12/21/2024    levothyroxine  (SYNTHROID ) 50 MCG tablet Take 50 mcg by mouth every other day.   Past Week   levothyroxine  (SYNTHROID ) 75 MCG tablet Take 75 mcg by mouth every other day.   Past Week   lisinopril  (ZESTRIL ) 20 MG tablet Take 1 tablet (20 mg total) by mouth daily. 30 tablet 0 12/22/2024 Morning   melatonin 3 MG TABS tablet Take 2 tablets (6 mg total) by mouth at bedtime as needed.   Unknown   metFORMIN  (GLUCOPHAGE ) 1000 MG tablet Take 1 tablet (1,000 mg total) by mouth 2 (two) times daily with a meal. 60 tablet 0 12/22/2024 Morning   mirtazapine  (REMERON ) 7.5 MG tablet Take 7.5 mg by mouth at bedtime.   12/21/2024   omeprazole (PRILOSEC) 40 MG capsule Take 40 mg by mouth daily.   Unknown   ondansetron  (ZOFRAN ) 4 MG tablet Take 4 mg by mouth every 6 (six) hours as needed for nausea or vomiting.   Unknown   polyethylene glycol (MIRALAX  / GLYCOLAX ) 17 g packet Take 17 g by mouth daily as needed for mild constipation.  Unknown   cholecalciferol (VITAMIN D3) 25 MCG (1000 UNIT) tablet Take 1,000 Units by mouth daily. (Patient not taking: Reported on 12/22/2024)   Not Taking   ferrous sulfate  325 (65 FE) MG tablet Take 325 mg by mouth every Monday, Wednesday, and Friday. (Patient not taking: Reported on 12/22/2024)   Not Taking   folic acid  (FOLVITE ) 1 MG tablet Take 1 tablet (1 mg total) by mouth daily. (Patient not taking: Reported on 12/22/2024)   Not Taking   vitamin B-12 (VITAMIN B12) 500 MCG tablet Take 1 tablet (500 mcg total) by mouth daily. (Patient not taking: Reported on 12/22/2024)   Not Taking    Assessment: Pharmacy consulted to dose heparin  in this 74 year old female with Afib, possible upcoming surgery.   Pt was on Eliquis  5 mg PO BID PTA,  last dose on 1/27 AM.  CrCl = 24.3 ml/min   Date Time HL  /  aPTT Rate/Comment 1/28 0805 >1.10 / 92 aPTT therapeutic on 800 units/hour 1/28 1612 --  /  107 aPTT Supratherapeutic on 800 units/hr 1/29     0236   > 1.10 / 118     aPTT SUPRAtherapeutic 750  units/hr 1/29 1305 -- / 88  aPTT therapeutic on 650 units/hour  Goal of Therapy:  Heparin  level 0.3-0.7 units/ml aPTT 66 - 102  seconds Monitor platelets by anticoagulation protocol: Yes   Plan:  Heparin  infusion restarted @ 1934 per RN  Heparin  started at infusion rate of 650 units/hour Check  aPTT in 8 hours Recheck HL on 1/30 with AM labs  Will use aPTT to guide dosing until correlating with HL Daily heparin  levels and CBC while on heparin  infusion  Thank you for involving pharmacy in this patient's care.   Madison Davidson CHRISTELLA Lutes, PharmD, BCPS Clinical Pharmacist 12/24/2024 8:26 PM      [1]  Allergies Allergen Reactions   Augmentin [Amoxicillin-Pot Clavulanate] Other (See Comments)    No reaction listed on MAR   Doxycycline Other (See Comments)    No reaction listed on MAR   Lisinopril  Other (See Comments)    No reaction listed on MAR   Norvasc [Amlodipine] Other (See Comments)    No reaction listed on [facility]

## 2024-12-24 NOTE — Op Note (Signed)
 PODIATRY / FOOT AND ANKLE SURGERY OPERATIVE REPORT    SURGEON: Prentice Lee, DPM  PRE-OPERATIVE DIAGNOSIS:  1.  Left heel wound, eschar 2.  PVD  POST-OPERATIVE DIAGNOSIS: Same  PROCEDURE(S): Left heel wound debridement, to level of bone, 100% excisional Application of skin substitute, Kerecis micro matrix 38 cm Application of wound VAC left heel  HEMOSTASIS: No tourniquet  ANESTHESIA: general  ESTIMATED BLOOD LOSS: 20 cc  FINDING(S): 1.  Eschar present to the posterior heel with necrotic tissue down to the level of bone and tendon, mostly periosteum appears to be intact calcaneus with little signs of infection clinically at area of healthy tissue 2.  Overall did not have much bleeding during operation despite not using a tourniquet  PATHOLOGY/SPECIMEN(S): Left heel bone culture and path specimen  INDICATIONS:   Madison Davidson is a 74 y.o. female who presents with a nonhealing wound to the left heel.  Patient has been confined to a bed or chair now for several months and ended up getting a pressure sore to the posterior aspect left heel.  Patient was admitted to the hospital due to various medical conditions including the heel wound.  All treatment options were discussed with the patient's family both conservative and surgical attempts at correction include potential risks and complications at this time they have elected for surgery consisting of left heel wound debridement with possible skin substitute and wound VAC application.  No guarantees given.  Discussed with patient if she did not have much bleeding intraoperatively would plan for potential CT angiogram with intervention tomorrow with vascular surgery.  DESCRIPTION: After obtaining full informed written consent, the patient was brought back to the operating room and placed supine upon the operating table.  The patient received IV antibiotics prior to induction.  After obtaining adequate anesthesia, the patient was prepped  and draped in the standard fashion.  16 cc of one-to-one mix of 1% lidocaine  with epinephrine  and half percent Marcaine  plain was injected about the operative areas.  No tourniquet was used.  Attention was directed to the posterior heel where predebridement measurement measured approximately 4 x 4 cm but unknown depth.  Wound debridement was performed removing the eschar entirely with 15 blade down to deep subcutaneous tissue.  There did not appear to be much bleeding to this area and tissues appear to be somewhat dusky with liquefactive fat type changes to the area.  Further debridement was then performed 100% excisional with the Versajet down to Achilles tendon and bone which appeared to be exposed.  Overall this tissue appeared to have somewhat better bleeding but still not a robust amount.  Postdebridement wound measurement was approximately 4 cm x 4 cm x 1.2 cm.  Tissue that was removed included fibrous tissue, fat, a portion of tendon and bone, necrotic tissue.  A Jamshidi needle was then used to introduce into the calcaneus in the area that appeared to be exposed.  The Jamshidi needle was removed and bone specimens were obtained.  1 bone specimen was sent off for culture and the other was sent to pathology to assess for bone infection.  Bone appeared to be hard and clinically did not appear to have appearance of osteomyelitic changes.  There appeared to be some decent bleeding but still not a robust amount so it was determined at this time to go ahead and use the Kerecis micro matrix, 38 cm.  This was prepped per manufactures protocol.  Applied the micro matrix directly to the wound to fill  in the deep void.  Overall this appeared to fill the wound well.  Hemostasis appeared to be well achieved.  Xeroform and wound gel was then applied to the area.  At this time a wound VAC was then applied as well, DME, hospital grade.  This will have to be changed every 3 to 4 days, preferably next week Monday,  Wednesday, Friday.  The patient tolerated the procedure and anesthesia well and was transferred to the cover room with vital signs stable and vascular status appearing to be intact to the left foot.  Following a period of postoperative monitoring the patient be discharged to the inpatient room with the appropriate orders, instructions, and medications.  Patient is to remain nonweightbearing to the left heel but can use the left forefoot for toe-touch for short distances for transfers.  Discussed with family lack of bleeding during debridement.  Plan for CT angiogram tomorrow with Dr. Marea.  COMPLICATIONS: None  CONDITION: Good, stable  Prentice Lee, DPM

## 2024-12-24 NOTE — Progress Notes (Signed)
 PHARMACY - ANTICOAGULATION CONSULT NOTE  Pharmacy Consult for Heparin   Indication: atrial fibrillation  Allergies[1]  Patient Measurements: Height: 5' 1 (154.9 cm) Weight: 54.4 kg (120 lb) IBW/kg (Calculated) : 47.8 HEPARIN  DW (KG): 54.4  Vital Signs: Temp: 98.4 F (36.9 C) (01/29 0447) BP: 125/45 (01/29 0447) Pulse Rate: 59 (01/29 0447)  Labs: Recent Labs    12/22/24 1139 12/23/24 0501 12/23/24 0805 12/23/24 1612 12/24/24 0236  HGB 9.3* 7.5*  --   --  7.3*  HCT 30.0* 23.0*  --   --  23.7*  PLT 269 222  --   --  223  APTT  --   --  92* 107* 118*  HEPARINUNFRC  --   --  >1.10*  --  >1.10*  CREATININE 1.51* 1.20*  --   --  1.12*    Estimated Creatinine Clearance: 32.7 mL/min (A) (by C-G formula based on SCr of 1.12 mg/dL (H)).   Medical History: Past Medical History:  Diagnosis Date   Cancer (HCC)    skin   Coronary artery disease    Diabetes mellitus without complication (HCC)    Hypertension    Stroke (HCC)     Medications:  Medications Prior to Admission  Medication Sig Dispense Refill Last Dose/Taking   acetaminophen  (TYLENOL ) 325 MG tablet Take 2 tablets (650 mg total) by mouth every 6 (six) hours as needed for mild pain, fever or headache.   Unknown   amiodarone  (PACERONE ) 200 MG tablet Take 1 tablet (200 mg total) by mouth 2 (two) times daily.   12/22/2024 Morning   apixaban  (ELIQUIS ) 5 MG TABS tablet Take 1 tablet (5 mg total) by mouth 2 (two) times daily. 60 tablet 3 12/22/2024 Morning   atorvastatin  (LIPITOR ) 40 MG tablet Take 1 tablet (40 mg total) by mouth daily.   12/21/2024 Bedtime   divalproex  (DEPAKOTE ) 125 MG DR tablet Take 125 mg by mouth 2 (two) times daily.   12/22/2024 Morning   famotidine  (PEPCID ) 20 MG tablet Take 20 mg by mouth at bedtime.   12/21/2024   levothyroxine  (SYNTHROID ) 50 MCG tablet Take 50 mcg by mouth every other day.   Past Week   levothyroxine  (SYNTHROID ) 75 MCG tablet Take 75 mcg by mouth every other day.   Past Week    lisinopril  (ZESTRIL ) 20 MG tablet Take 1 tablet (20 mg total) by mouth daily. 30 tablet 0 12/22/2024 Morning   melatonin 3 MG TABS tablet Take 2 tablets (6 mg total) by mouth at bedtime as needed.   Unknown   metFORMIN  (GLUCOPHAGE ) 1000 MG tablet Take 1 tablet (1,000 mg total) by mouth 2 (two) times daily with a meal. 60 tablet 0 12/22/2024 Morning   mirtazapine  (REMERON ) 7.5 MG tablet Take 7.5 mg by mouth at bedtime.   12/21/2024   omeprazole (PRILOSEC) 40 MG capsule Take 40 mg by mouth daily.   Unknown   ondansetron  (ZOFRAN ) 4 MG tablet Take 4 mg by mouth every 6 (six) hours as needed for nausea or vomiting.   Unknown   polyethylene glycol (MIRALAX  / GLYCOLAX ) 17 g packet Take 17 g by mouth daily as needed for mild constipation.   Unknown   cholecalciferol (VITAMIN D3) 25 MCG (1000 UNIT) tablet Take 1,000 Units by mouth daily. (Patient not taking: Reported on 12/22/2024)   Not Taking   ferrous sulfate  325 (65 FE) MG tablet Take 325 mg by mouth every Monday, Wednesday, and Friday. (Patient not taking: Reported on 12/22/2024)   Not Taking  folic acid  (FOLVITE ) 1 MG tablet Take 1 tablet (1 mg total) by mouth daily. (Patient not taking: Reported on 12/22/2024)   Not Taking   vitamin B-12 (VITAMIN B12) 500 MCG tablet Take 1 tablet (500 mcg total) by mouth daily. (Patient not taking: Reported on 12/22/2024)   Not Taking    Assessment: Pharmacy consulted to dose heparin  in this 74 year old female with Afib, possible upcoming surgery.   Pt was on Eliquis  5 mg PO BID PTA,  last dose on 1/27 AM.  CrCl = 24.3 ml/min   Date Time HL  /  aPTT Rate/Comment 1/28 0805 >1.10 / 92 aPTT therapeutic on 800 units/hour 1/28 1612 --  /  107 aPTT Supratherapeutic on 800 units/hr 1/29     0236   > 1.10 / 118     aPTT SUPRAtherapeutic 750 units/hr  Goal of Therapy:  Heparin  level 0.3-0.7 units/ml aPTT 66 - 102  seconds Monitor platelets by anticoagulation protocol: Yes   Plan:  1/29:  aPTT @ 0236 = 118,  SUPRAtherapeutic  - will decrease heparin  drip rate to 650 units/hr and recheck aPTT 8 hrs after rate change  - recheck HL on 1/30 with AM labs  - will use aPTT to guide dosing until correlating with HL - Daily heparin  levels and CBC while on heparin  infusion  Thank you for involving pharmacy in this patient's care.   Brit Carbonell D, PharmD Clinical Pharmacist 12/24/2024 4:49 AM       [1]  Allergies Allergen Reactions   Augmentin [Amoxicillin-Pot Clavulanate] Other (See Comments)    No reaction listed on MAR   Doxycycline Other (See Comments)    No reaction listed on MAR   Lisinopril  Other (See Comments)    No reaction listed on MAR   Norvasc [Amlodipine] Other (See Comments)    No reaction listed on [facility]

## 2024-12-24 NOTE — Anesthesia Preprocedure Evaluation (Addendum)
 "                                  Anesthesia Evaluation  Patient identified by MRN, date of birth, ID band Patient awake    Reviewed: Allergy & Precautions, NPO status , Patient's Chart, lab work & pertinent test results  History of Anesthesia Complications Negative for: history of anesthetic complications  Airway Mallampati: IV  TM Distance: >3 FB Neck ROM: full    Dental  (+) Edentulous Upper, Edentulous Lower   Pulmonary neg pulmonary ROS   Pulmonary exam normal        Cardiovascular hypertension, Pt. on medications + CAD and + Peripheral Vascular Disease  Normal cardiovascular exam+ dysrhythmias (paroxysmal a fib on Eliquis )   ECG 10/28/24: Normal sinus rhythm with sinus arrhythmia  T wave abnormality, consider lateral ischemia  Echo 05/2022:   1. Left ventricular ejection fraction, by estimation, is 60 to 65%. The left ventricle has normal function. The left ventricle has no regional wall motion abnormalities. Left ventricular diastolic parameters were normal.   2. Right ventricular systolic function is normal. The right ventricular size is normal.   3. The mitral valve is normal in structure. Trivial mitral valve regurgitation.   4. The aortic valve is normal in structure. Aortic valve regurgitation is not visualized.   Myocardial perfusion 03/24/20: Normal treadmill EKG without evidence of ischemia or arrhythmia  Normal myocardial perfusion without evidence of myocardial ischemia     Neuro/Psych CVA (2022) negative neurological ROS  negative psych ROS   GI/Hepatic negative GI ROS, Neg liver ROS,,,  Endo/Other  diabetes, Type 2Hypothyroidism    Renal/GU Renal disease (AKI)  negative genitourinary   Musculoskeletal   Abdominal   Peds  Hematology negative hematology ROS (+) Blood dyscrasia, anemia   Anesthesia Other Findings Cardiology note 11/30/24:  PAF (paroxysmal atrial fibrillation) (HCC)  Reviewed EKG 10/28/2024: NSR with sinus  arrhythmia  On exam noted regular, rate and rhythm.  No need to repeat EKG.   Reports very brief nosebleeds at night occasionally. Reviewed facility labs from 11/23/2024: CBC with Hgb stable at 9.4.   Denies palpitations, dizziness, or chest discomfort. No recurrent dark stools or significant bleeding.   Continue on amiodarone  200 mg twice daily and Eliquis  5 mg twice daily. (dose appropriate for age 74, weight 61 kg, Cr WNL in 10/2024)    Primary hypertension  BP this OV well controlled today: 112/64  Reviewed facility labs from 11/23/2024: K, Cr WNL   Continue on lisinopril  20 mg daily  Encourage physical activity for 150 minutes per week and heart healthy low sodium diet. Discussed limiting sodium intake to < 2 grams daily.      Hyperlipidemia LDL goal <70  Reviewed facility labs from 11/23/2024: LDL 69. LDL at goal.   Continue Lipitor  40 mg daily.   Hisory of CVA (cerebrovascular accident)  Previously was on Plavix  but discontinued given need of OAC.    Dispo: Follow up in 3 months with VM or any APP.   Reproductive/Obstetrics negative OB ROS                              Anesthesia Physical Anesthesia Plan  ASA: 3  Anesthesia Plan: General   Post-op Pain Management: Ofirmev  IV (intra-op)*   Induction: Intravenous  PONV Risk Score and Plan: 3 and Ondansetron , Dexamethasone  and Treatment  may vary due to age or medical condition  Airway Management Planned: Oral ETT  Additional Equipment:   Intra-op Plan:   Post-operative Plan: Extubation in OR  Informed Consent: I have reviewed the patients History and Physical, chart, labs and discussed the procedure including the risks, benefits and alternatives for the proposed anesthesia with the patient or authorized representative who has indicated his/her understanding and acceptance.   Patient has DNR.  Discussed DNR with patient and Suspend DNR.   Dental Advisory Given  Plan Discussed  with: Anesthesiologist, CRNA and Surgeon  Anesthesia Plan Comments: (Patient consented for risks of anesthesia including but not limited to:  - adverse reactions to medications - damage to eyes, teeth, lips or other oral mucosa - nerve damage due to positioning  - sore throat or hoarseness - Damage to heart, brain, nerves, lungs, other parts of body or loss of life  Patient voiced understanding and assent.)         Anesthesia Quick Evaluation  "

## 2024-12-24 NOTE — Transfer of Care (Signed)
 Immediate Anesthesia Transfer of Care Note  Patient: Madison Davidson  Procedure(s) Performed: IRRIGATION AND DEBRIDEMENT WOUND (Left) APPLICATION, WOUND VAC (Left) GRAFT APPLICATION  Patient Location: PACU  Anesthesia Type:General  Level of Consciousness: drowsy  Airway & Oxygen Therapy: Patient Spontanous Breathing and Patient connected to face mask oxygen  Post-op Assessment: Report given to RN  Post vital signs: stable  Last Vitals:  Vitals Value Taken Time  BP 146/46 12/24/24 17:26  Temp 36.2 C 12/24/24 17:25  Pulse 76 12/24/24 17:29  Resp 18 12/24/24 17:29  SpO2 100 % 12/24/24 17:29  Vitals shown include unfiled device data.  Last Pain:  Vitals:   12/24/24 1545  TempSrc: Temporal  PainSc: 0-No pain         Complications: No notable events documented.

## 2024-12-24 NOTE — Progress Notes (Signed)
 PHARMACY - ANTICOAGULATION CONSULT NOTE  Pharmacy Consult for Heparin   Indication: atrial fibrillation  Allergies[1]  Patient Measurements: Height: 5' 1 (154.9 cm) Weight: 54.4 kg (120 lb) IBW/kg (Calculated) : 47.8 HEPARIN  DW (KG): 54.4  Vital Signs: Temp: 97.7 F (36.5 C) (01/29 0840) BP: 156/49 (01/29 0840) Pulse Rate: 65 (01/29 0840)  Labs: Recent Labs    12/22/24 1139 12/23/24 0501 12/23/24 0805 12/23/24 0805 12/23/24 1612 12/24/24 0236 12/24/24 1305  HGB 9.3* 7.5*  --   --   --  7.3*  --   HCT 30.0* 23.0*  --   --   --  23.7*  --   PLT 269 222  --   --   --  223  --   APTT  --   --  92*   < > 107* 118* 88*  HEPARINUNFRC  --   --  >1.10*  --   --  >1.10*  --   CREATININE 1.51* 1.20*  --   --   --  1.12*  --    < > = values in this interval not displayed.    Estimated Creatinine Clearance: 32.7 mL/min (A) (by C-G formula based on SCr of 1.12 mg/dL (H)).   Medical History: Past Medical History:  Diagnosis Date   Cancer (HCC)    skin   Coronary artery disease    Diabetes mellitus without complication (HCC)    Hypertension    Stroke (HCC)     Medications:  Medications Prior to Admission  Medication Sig Dispense Refill Last Dose/Taking   acetaminophen  (TYLENOL ) 325 MG tablet Take 2 tablets (650 mg total) by mouth every 6 (six) hours as needed for mild pain, fever or headache.   Unknown   amiodarone  (PACERONE ) 200 MG tablet Take 1 tablet (200 mg total) by mouth 2 (two) times daily.   12/22/2024 Morning   apixaban  (ELIQUIS ) 5 MG TABS tablet Take 1 tablet (5 mg total) by mouth 2 (two) times daily. 60 tablet 3 12/22/2024 Morning   atorvastatin  (LIPITOR ) 40 MG tablet Take 1 tablet (40 mg total) by mouth daily.   12/21/2024 Bedtime   divalproex  (DEPAKOTE ) 125 MG DR tablet Take 125 mg by mouth 2 (two) times daily.   12/22/2024 Morning   famotidine  (PEPCID ) 20 MG tablet Take 20 mg by mouth at bedtime.   12/21/2024   levothyroxine  (SYNTHROID ) 50 MCG tablet Take 50 mcg  by mouth every other day.   Past Week   levothyroxine  (SYNTHROID ) 75 MCG tablet Take 75 mcg by mouth every other day.   Past Week   lisinopril  (ZESTRIL ) 20 MG tablet Take 1 tablet (20 mg total) by mouth daily. 30 tablet 0 12/22/2024 Morning   melatonin 3 MG TABS tablet Take 2 tablets (6 mg total) by mouth at bedtime as needed.   Unknown   metFORMIN  (GLUCOPHAGE ) 1000 MG tablet Take 1 tablet (1,000 mg total) by mouth 2 (two) times daily with a meal. 60 tablet 0 12/22/2024 Morning   mirtazapine  (REMERON ) 7.5 MG tablet Take 7.5 mg by mouth at bedtime.   12/21/2024   omeprazole (PRILOSEC) 40 MG capsule Take 40 mg by mouth daily.   Unknown   ondansetron  (ZOFRAN ) 4 MG tablet Take 4 mg by mouth every 6 (six) hours as needed for nausea or vomiting.   Unknown   polyethylene glycol (MIRALAX  / GLYCOLAX ) 17 g packet Take 17 g by mouth daily as needed for mild constipation.   Unknown   cholecalciferol (VITAMIN D3) 25  MCG (1000 UNIT) tablet Take 1,000 Units by mouth daily. (Patient not taking: Reported on 12/22/2024)   Not Taking   ferrous sulfate  325 (65 FE) MG tablet Take 325 mg by mouth every Monday, Wednesday, and Friday. (Patient not taking: Reported on 12/22/2024)   Not Taking   folic acid  (FOLVITE ) 1 MG tablet Take 1 tablet (1 mg total) by mouth daily. (Patient not taking: Reported on 12/22/2024)   Not Taking   vitamin B-12 (VITAMIN B12) 500 MCG tablet Take 1 tablet (500 mcg total) by mouth daily. (Patient not taking: Reported on 12/22/2024)   Not Taking    Assessment: Pharmacy consulted to dose heparin  in this 74 year old female with Afib, possible upcoming surgery.   Pt was on Eliquis  5 mg PO BID PTA,  last dose on 1/27 AM.  CrCl = 24.3 ml/min   Date Time HL  /  aPTT Rate/Comment 1/28 0805 >1.10 / 92 aPTT therapeutic on 800 units/hour 1/28 1612 --  /  107 aPTT Supratherapeutic on 800 units/hr 1/29     0236   > 1.10 / 118     aPTT SUPRAtherapeutic 750 units/hr 1/29 1305 -- / 88  aPTT therapeutic on 650  units/hour  Goal of Therapy:  Heparin  level 0.3-0.7 units/ml aPTT 66 - 102  seconds Monitor platelets by anticoagulation protocol: Yes   Plan:  aPTT is therapeutic x1 Continue heparin  infusion at 650 units/hour Check confirmatory aPTT in 8 hours Recheck HL on 1/30 with AM labs  Will use aPTT to guide dosing until correlating with HL Daily heparin  levels and CBC while on heparin  infusion  Thank you for involving pharmacy in this patient's care.   Damien Napoleon, PharmD Clinical Pharmacist 12/24/2024 1:38 PM    [1]  Allergies Allergen Reactions   Augmentin [Amoxicillin-Pot Clavulanate] Other (See Comments)    No reaction listed on MAR   Doxycycline Other (See Comments)    No reaction listed on MAR   Lisinopril  Other (See Comments)    No reaction listed on MAR   Norvasc [Amlodipine] Other (See Comments)    No reaction listed on [facility]

## 2024-12-24 NOTE — Inpatient Diabetes Management (Signed)
 Inpatient Diabetes Program Recommendations  AACE/ADA: New Consensus Statement on Inpatient Glycemic Control   Target Ranges:  Prepandial:   less than 140 mg/dL      Peak postprandial:   less than 180 mg/dL (1-2 hours)      Critically ill patients:  140 - 180 mg/dL    Latest Reference Range & Units 12/23/24 08:04 12/23/24 11:28 12/23/24 17:32 12/23/24 22:24 12/23/24 22:48 12/24/24 00:03 12/24/24 04:04  Glucose-Capillary 70 - 99 mg/dL 71 877 (H)  Novolog  1 unit 171 (H)  Novolog  2 units 69 (L) 74 121 (H) 85   Review of Glycemic Control  Diabetes history: DM2 Outpatient Diabetes medications: Metformin  1000 mg BID Current orders for Inpatient glycemic control: Novolog  0-9 units TID with meals  Inpatient Diabetes Program Recommendations:    Insulin : Please consider decreasing Novolog  correction to 0-6 units TID with meals.  Thanks, Earnie Gainer, RN, MSN, CDCES Diabetes Coordinator Inpatient Diabetes Program [phone] (Team Pager from 8am to 5pm)

## 2024-12-24 NOTE — Plan of Care (Signed)
" °  Problem: Education: Goal: Ability to describe self-care measures that may prevent or decrease complications (Diabetes Survival Skills Education) will improve Outcome: Progressing   Problem: Coping: Goal: Ability to adjust to condition or change in health will improve Outcome: Progressing   Problem: Fluid Volume: Goal: Ability to maintain a balanced intake and output will improve Outcome: Progressing   Problem: Metabolic: Goal: Ability to maintain appropriate glucose levels will improve Outcome: Progressing   Problem: Skin Integrity: Goal: Risk for impaired skin integrity will decrease Outcome: Progressing   Problem: Education: Goal: Knowledge of General Education information will improve Description: Including pain rating scale, medication(s)/side effects and non-pharmacologic comfort measures Outcome: Progressing   Problem: Activity: Goal: Risk for activity intolerance will decrease Outcome: Progressing   "

## 2024-12-25 ENCOUNTER — Encounter: Admission: EM | Payer: Self-pay | Source: Home / Self Care | Attending: Internal Medicine

## 2024-12-25 ENCOUNTER — Encounter: Payer: Self-pay | Admitting: Podiatry

## 2024-12-25 DIAGNOSIS — L97529 Non-pressure chronic ulcer of other part of left foot with unspecified severity: Secondary | ICD-10-CM | POA: Diagnosis not present

## 2024-12-25 LAB — CBC WITH DIFFERENTIAL/PLATELET
Abs Immature Granulocytes: 0.06 10*3/uL (ref 0.00–0.07)
Basophils Absolute: 0 10*3/uL (ref 0.0–0.1)
Basophils Relative: 0 %
Eosinophils Absolute: 0 10*3/uL (ref 0.0–0.5)
Eosinophils Relative: 0 %
HCT: 23 % — ABNORMAL LOW (ref 36.0–46.0)
Hemoglobin: 7.2 g/dL — ABNORMAL LOW (ref 12.0–15.0)
Immature Granulocytes: 1 %
Lymphocytes Relative: 5 %
Lymphs Abs: 0.4 10*3/uL — ABNORMAL LOW (ref 0.7–4.0)
MCH: 27.6 pg (ref 26.0–34.0)
MCHC: 31.3 g/dL (ref 30.0–36.0)
MCV: 88.1 fL (ref 80.0–100.0)
Monocytes Absolute: 0.2 10*3/uL (ref 0.1–1.0)
Monocytes Relative: 2 %
Neutro Abs: 7.9 10*3/uL — ABNORMAL HIGH (ref 1.7–7.7)
Neutrophils Relative %: 92 %
Platelets: 211 10*3/uL (ref 150–400)
RBC: 2.61 MIL/uL — ABNORMAL LOW (ref 3.87–5.11)
RDW: 15.9 % — ABNORMAL HIGH (ref 11.5–15.5)
WBC: 8.5 10*3/uL (ref 4.0–10.5)
nRBC: 0 % (ref 0.0–0.2)

## 2024-12-25 LAB — GLUCOSE, CAPILLARY
Glucose-Capillary: 119 mg/dL — ABNORMAL HIGH (ref 70–99)
Glucose-Capillary: 174 mg/dL — ABNORMAL HIGH (ref 70–99)
Glucose-Capillary: 195 mg/dL — ABNORMAL HIGH (ref 70–99)
Glucose-Capillary: 216 mg/dL — ABNORMAL HIGH (ref 70–99)
Glucose-Capillary: 239 mg/dL — ABNORMAL HIGH (ref 70–99)
Glucose-Capillary: 292 mg/dL — ABNORMAL HIGH (ref 70–99)

## 2024-12-25 LAB — BASIC METABOLIC PANEL WITH GFR
Anion gap: 12 (ref 5–15)
BUN: 19 mg/dL (ref 8–23)
CO2: 19 mmol/L — ABNORMAL LOW (ref 22–32)
Calcium: 6.6 mg/dL — ABNORMAL LOW (ref 8.9–10.3)
Chloride: 105 mmol/L (ref 98–111)
Creatinine, Ser: 1.17 mg/dL — ABNORMAL HIGH (ref 0.44–1.00)
GFR, Estimated: 48 mL/min — ABNORMAL LOW
Glucose, Bld: 252 mg/dL — ABNORMAL HIGH (ref 70–99)
Potassium: 4.8 mmol/L (ref 3.5–5.1)
Sodium: 137 mmol/L (ref 135–145)

## 2024-12-25 LAB — HEPARIN LEVEL (UNFRACTIONATED): Heparin Unfractionated: >1.1 [IU]/mL — ABNORMAL HIGH (ref 0.30–0.70)

## 2024-12-25 LAB — APTT: aPTT: 87 s — ABNORMAL HIGH (ref 24–36)

## 2024-12-25 MED ORDER — HEPARIN SODIUM (PORCINE) 1000 UNIT/ML IJ SOLN
INTRAMUSCULAR | Status: DC | PRN
  Administered 2024-12-25: 5000 [IU] via INTRAVENOUS

## 2024-12-25 MED ORDER — HEPARIN SODIUM (PORCINE) 1000 UNIT/ML IJ SOLN
INTRAMUSCULAR | Status: AC
  Filled 2024-12-25: qty 10

## 2024-12-25 MED ORDER — IODIXANOL 320 MG/ML IV SOLN
INTRAVENOUS | Status: DC | PRN
  Administered 2024-12-25: 50 mL

## 2024-12-25 MED ORDER — LIDOCAINE-EPINEPHRINE (PF) 1 %-1:200000 IJ SOLN
INTRAMUSCULAR | Status: DC | PRN
  Administered 2024-12-25: 10 mL via INTRADERMAL

## 2024-12-25 MED ORDER — MIDAZOLAM HCL (PF) 2 MG/2ML IJ SOLN
INTRAMUSCULAR | Status: DC | PRN
  Administered 2024-12-25: 1 mg via INTRAVENOUS
  Administered 2024-12-25 (×2): .5 mg via INTRAVENOUS

## 2024-12-25 MED ORDER — FENTANYL CITRATE (PF) 100 MCG/2ML IJ SOLN
INTRAMUSCULAR | Status: AC
  Filled 2024-12-25: qty 2

## 2024-12-25 MED ORDER — HEPARIN (PORCINE) IN NACL 1000-0.9 UT/500ML-% IV SOLN
INTRAVENOUS | Status: DC | PRN
  Administered 2024-12-25: 1000 mL

## 2024-12-25 MED ORDER — FENTANYL CITRATE (PF) 100 MCG/2ML IJ SOLN
INTRAMUSCULAR | Status: DC | PRN
  Administered 2024-12-25: 50 ug via INTRAVENOUS
  Administered 2024-12-25 (×2): 12.5 ug via INTRAVENOUS

## 2024-12-25 MED ORDER — SODIUM CHLORIDE 0.9 % IV SOLN: INTRAVENOUS | Status: DC

## 2024-12-25 MED ORDER — MIDAZOLAM HCL 5 MG/5ML IJ SOLN
INTRAMUSCULAR | Status: AC
  Filled 2024-12-25: qty 5

## 2024-12-25 NOTE — Progress Notes (Signed)
 PODIATRY / FOOT AND ANKLE SURGERY PROGRESS NOTE  Requesting Physician: Dr. Laurita  Reason for consult: Left heel ulcer  Chief Complaint: Left heel pain/sore   HPI: Madison Davidson is a 74 y.o. female who presents today resting in bed comfortably.  Patient underwent CT angiogram with intervention today.  Patient has dressings clean, dry, and intact to the left foot with wound VAC on with suction still present.  No Prevalon boot is in the room.  Patient is in postop shoe laying in bed.  Patient is nonambulatory currently.  PMHx:  Past Medical History:  Diagnosis Date   Cancer (HCC)    skin   Coronary artery disease    Diabetes mellitus without complication (HCC)    Hypertension    Stroke [facility])     Surgical Hx:  Past Surgical History:  Procedure Laterality Date   ABDOMINAL HYSTERECTOMY     APPLICATION OF WOUND VAC Left 12/24/2024   Procedure: APPLICATION, WOUND VAC;  Surgeon: Lennie Barter, DPM;  Location: [facility] ORS;  Service: Orthopedics/Podiatry;  Laterality: Left;  4x4x1.2   GRAFT APPLICATION  12/24/2024   Procedure: GRAFT APPLICATION;  Surgeon: Lennie Barter, DPM;  Location: [facility] ORS;  Service: Orthopedics/Podiatry;;   INCISION AND DRAINAGE OF WOUND Left 12/24/2024   Procedure: IRRIGATION AND DEBRIDEMENT WOUND;  Surgeon: Lennie Barter, DPM;  Location: [facility] ORS;  Service: Orthopedics/Podiatry;  Laterality: Left;   IR ANGIO INTRA EXTRACRAN SEL INTERNAL CAROTID BILAT MOD SED  05/11/2021   IR ANGIO VERTEBRAL SEL VERTEBRAL UNI R MOD SED  05/11/2021   IR ANGIOGRAM EXTREMITY LEFT  05/11/2021   IR CT HEAD LTD  05/11/2021   IR INTRA CRAN STENT  05/11/2021   IR INTRA CRAN STENT  05/12/2021   IR US  GUIDE VASC ACCESS RIGHT  05/11/2021   LOWER EXTREMITY ANGIOGRAPHY Left 12/25/2024   Procedure: Lower Extremity Angiography;  Surgeon: Marea Selinda RAMAN, MD;  Location: [facility] INVASIVE CV LAB;  Service: Cardiovascular;  Laterality: Left;   RADIOLOGY WITH ANESTHESIA N/A 05/11/2021   Procedure: IR WITH ANESTHESIA  - INTRACRANIAL STENT;  Surgeon: de Macedo Rodrigues, Katyucia, MD;  Location: [facility] OR;  Service: Radiology;  Laterality: N/A;    FHx:  Family History  Problem Relation Age of Onset   Heart disease Mother    Heart disease Father     Social History:  reports that she has never smoked. She has never used smokeless tobacco. She reports that she does not drink alcohol and does not use drugs.  Allergies: Allergies[1]  Medications Prior to Admission  Medication Sig Dispense Refill   acetaminophen  (TYLENOL ) 325 MG tablet Take 2 tablets (650 mg total) by mouth every 6 (six) hours as needed for mild pain, fever or headache.     amiodarone  (PACERONE ) 200 MG tablet Take 1 tablet (200 mg total) by mouth 2 (two) times daily.     apixaban  (ELIQUIS ) 5 MG TABS tablet Take 1 tablet (5 mg total) by mouth 2 (two) times daily. 60 tablet 3   atorvastatin  (LIPITOR ) 40 MG tablet Take 1 tablet (40 mg total) by mouth daily.     divalproex  (DEPAKOTE ) 125 MG DR tablet Take 125 mg by mouth 2 (two) times daily.     famotidine  (PEPCID ) 20 MG tablet Take 20 mg by mouth at bedtime.     levothyroxine  (SYNTHROID ) 50 MCG tablet Take 50 mcg by mouth every other day.     levothyroxine  (SYNTHROID ) 75 MCG tablet Take 75 mcg by mouth every other day.  lisinopril  (ZESTRIL ) 20 MG tablet Take 1 tablet (20 mg total) by mouth daily. 30 tablet 0   melatonin 3 MG TABS tablet Take 2 tablets (6 mg total) by mouth at bedtime as needed.     metFORMIN  (GLUCOPHAGE ) 1000 MG tablet Take 1 tablet (1,000 mg total) by mouth 2 (two) times daily with a meal. 60 tablet 0   mirtazapine  (REMERON ) 7.5 MG tablet Take 7.5 mg by mouth at bedtime.     omeprazole (PRILOSEC) 40 MG capsule Take 40 mg by mouth daily.     ondansetron  (ZOFRAN ) 4 MG tablet Take 4 mg by mouth every 6 (six) hours as needed for nausea or vomiting.     polyethylene glycol (MIRALAX  / GLYCOLAX ) 17 g packet Take 17 g by mouth daily as needed for mild constipation.      cholecalciferol (VITAMIN D3) 25 MCG (1000 UNIT) tablet Take 1,000 Units by mouth daily. (Patient not taking: Reported on 12/22/2024)     ferrous sulfate  325 (65 FE) MG tablet Take 325 mg by mouth every Monday, Wednesday, and Friday. (Patient not taking: Reported on 12/22/2024)     folic acid  (FOLVITE ) 1 MG tablet Take 1 tablet (1 mg total) by mouth daily. (Patient not taking: Reported on 12/22/2024)     vitamin B-12 (VITAMIN B12) 500 MCG tablet Take 1 tablet (500 mcg total) by mouth daily. (Patient not taking: Reported on 12/22/2024)      Physical Exam: General: Alert and oriented.  No apparent distress.  Left foot dressing intact, wound VAC suction appears to be well.  No strikethrough.  Dry stable eschars present at the ends of both big toes.  Results for orders placed or performed during the hospital encounter of 12/22/24 (from the past 48 hours)  APTT     Status: Abnormal   Collection Time: 12/23/24  4:12 PM  Result Value Ref Range   aPTT 107 (H) 24 - 36 seconds    Comment:        IF BASELINE aPTT IS ELEVATED, SUGGEST PATIENT RISK ASSESSMENT BE USED TO DETERMINE APPROPRIATE ANTICOAGULANT THERAPY. Performed at [facility], [address] [place], [place] [address]   Glucose, capillary     Status: Abnormal   Collection Time: 12/23/24  5:32 PM  Result Value Ref Range   Glucose-Capillary 171 (H) 70 - 99 mg/dL    Comment: Glucose reference range applies only to samples taken after fasting for at least 8 hours.   Comment 1 Notify RN   Glucose, capillary     Status: Abnormal   Collection Time: 12/23/24 10:24 PM  Result Value Ref Range   Glucose-Capillary 69 (L) 70 - 99 mg/dL    Comment: Glucose reference range applies only to samples taken after fasting for at least 8 hours.  Glucose, capillary     Status: None   Collection Time: 12/23/24 10:48 PM  Result Value Ref Range   Glucose-Capillary 74 70 - 99 mg/dL    Comment: Glucose reference range applies only to samples taken  after fasting for at least 8 hours.  Glucose, capillary     Status: Abnormal   Collection Time: 12/24/24 12:03 AM  Result Value Ref Range   Glucose-Capillary 121 (H) 70 - 99 mg/dL    Comment: Glucose reference range applies only to samples taken after fasting for at least 8 hours.  CBC     Status: Abnormal   Collection Time: 12/24/24  2:36 AM  Result Value Ref Range   WBC  8.7 4.0 - 10.5 K/uL   RBC 2.71 (L) 3.87 - 5.11 MIL/uL   Hemoglobin 7.3 (L) 12.0 - 15.0 g/dL   HCT 76.2 (L) 63.9 - 53.9 %   MCV 87.5 80.0 - 100.0 fL   MCH 26.9 26.0 - 34.0 pg   MCHC 30.8 30.0 - 36.0 g/dL   RDW 84.2 (H) 88.4 - 84.4 %   Platelets 223 150 - 400 K/uL   nRBC 0.0 0.0 - 0.2 %    Comment: Performed at [facility], [address] [place], [place] [address]  APTT     Status: Abnormal   Collection Time: 12/24/24  2:36 AM  Result Value Ref Range   aPTT 118 (H) 24 - 36 seconds    Comment:        IF BASELINE aPTT IS ELEVATED, SUGGEST PATIENT RISK ASSESSMENT BE USED TO DETERMINE APPROPRIATE ANTICOAGULANT THERAPY. Performed at [facility], [address] [place], [place] [address]   Basic metabolic panel with GFR     Status: Abnormal   Collection Time: 12/24/24  2:36 AM  Result Value Ref Range   Sodium 142 135 - 145 mmol/L   Potassium 5.0 3.5 - 5.1 mmol/L   Chloride 110 98 - 111 mmol/L   CO2 21 (L) 22 - 32 mmol/L   Glucose, Bld 113 (H) 70 - 99 mg/dL    Comment: Glucose reference range applies only to samples taken after fasting for at least 8 hours.   BUN 20 8 - 23 mg/dL   Creatinine, Ser 8.87 (H) 0.44 - 1.00 mg/dL   Calcium  6.6 (L) 8.9 - 10.3 mg/dL   GFR, Estimated 51 (L) >60 mL/min    Comment: (NOTE) Calculated using the CKD-EPI Creatinine Equation (2021)    Anion gap 11 5 - 15    Comment: Performed at [facility], [address] [place], [place] [address]  Heparin  level (unfractionated)     Status: Abnormal   Collection Time: 12/24/24  2:36 AM  Result Value  Ref Range   Heparin  Unfractionated >1.10 (H) 0.30 - 0.70 IU/mL    Comment: (NOTE) The clinical reportable range upper limit is being lowered to >1.10 to align with the FDA approved guidance for the current laboratory assay.  If heparin  results are below expected values, and patient dosage has  been confirmed, suggest follow up testing of antithrombin III levels. Performed at [facility], [address] [place], [place] [address]   Glucose, capillary     Status: None   Collection Time: 12/24/24  4:04 AM  Result Value Ref Range   Glucose-Capillary 85 70 - 99 mg/dL    Comment: Glucose reference range applies only to samples taken after fasting for at least 8 hours.  Glucose, capillary     Status: None   Collection Time: 12/24/24  8:40 AM  Result Value Ref Range   Glucose-Capillary 72 70 - 99 mg/dL    Comment: Glucose reference range applies only to samples taken after fasting for at least 8 hours.  Glucose, capillary     Status: Abnormal   Collection Time: 12/24/24 10:52 AM  Result Value Ref Range   Glucose-Capillary 154 (H) 70 - 99 mg/dL    Comment: Glucose reference range applies only to samples taken after fasting for at least 8 hours.   Comment 1 Notify RN    Comment 2 Document in Chart   Glucose, capillary     Status: Abnormal   Collection Time: 12/24/24  12:05 PM  Result Value Ref Range   Glucose-Capillary 113 (H) 70 - 99 mg/dL    Comment: Glucose reference range applies only to samples taken after fasting for at least 8 hours.  APTT     Status: Abnormal   Collection Time: 12/24/24  1:05 PM  Result Value Ref Range   aPTT 88 (H) 24 - 36 seconds    Comment:        IF BASELINE aPTT IS ELEVATED, SUGGEST PATIENT RISK ASSESSMENT BE USED TO DETERMINE APPROPRIATE ANTICOAGULANT THERAPY. Performed at [facility], [address] [place], [place] [address]   Glucose, capillary     Status: None   Collection Time: 12/24/24  3:54 PM  Result Value Ref Range    Glucose-Capillary 78 70 - 99 mg/dL    Comment: Glucose reference range applies only to samples taken after fasting for at least 8 hours.  Aerobic/Anaerobic Culture w Gram Stain (surgical/deep wound)     Status: None (Preliminary result)   Collection Time: 12/24/24  4:48 PM   Specimen: PATH Bone biopsy; Tissue  Result Value Ref Range   Specimen Description      BONE Performed at [facility], [address], [place], [place] [address]    Special Requests      NONE Performed at [facility], [address] [place], [place] [address]    Gram Stain      RARE WBC PRESENT, PREDOMINANTLY PMN NO ORGANISMS SEEN Performed at [facility], [address], [place], [place] [address]    Culture PENDING    Report Status PENDING   Glucose, capillary     Status: Abnormal   Collection Time: 12/24/24  5:30 PM  Result Value Ref Range   Glucose-Capillary 111 (H) 70 - 99 mg/dL    Comment: Glucose reference range applies only to samples taken after fasting for at least 8 hours.  Glucose, capillary     Status: Abnormal   Collection Time: 12/24/24  8:42 PM  Result Value Ref Range   Glucose-Capillary 256 (H) 70 - 99 mg/dL    Comment: Glucose reference range applies only to samples taken after fasting for at least 8 hours.  Glucose, capillary     Status: Abnormal   Collection Time: 12/25/24 12:08 AM  Result Value Ref Range   Glucose-Capillary 292 (H) 70 - 99 mg/dL    Comment: Glucose reference range applies only to samples taken after fasting for at least 8 hours.  Glucose, capillary     Status: Abnormal   Collection Time: 12/25/24  5:12 AM  Result Value Ref Range   Glucose-Capillary 239 (H) 70 - 99 mg/dL    Comment: Glucose reference range applies only to samples taken after fasting for at least 8 hours.  Heparin  level (unfractionated)     Status: Abnormal   Collection Time: 12/25/24  5:41 AM  Result Value Ref Range   Heparin  Unfractionated >1.10 (H) 0.30 - 0.70 IU/mL     Comment: (NOTE) The clinical reportable range upper limit is being lowered to >1.10 to align with the FDA approved guidance for the current laboratory assay.  If heparin  results are below expected values, and patient dosage has  been confirmed, suggest follow up testing of antithrombin III levels. Performed at [facility], [address] [place], [place] [address]   CBC with Differential/Platelet     Status: Abnormal   Collection Time: 12/25/24  5:41 AM  Result Value Ref Range  WBC 8.5 4.0 - 10.5 K/uL   RBC 2.61 (L) 3.87 - 5.11 MIL/uL   Hemoglobin 7.2 (L) 12.0 - 15.0 g/dL   HCT 76.9 (L) 63.9 - 53.9 %   MCV 88.1 80.0 - 100.0 fL   MCH 27.6 26.0 - 34.0 pg   MCHC 31.3 30.0 - 36.0 g/dL   RDW 84.0 (H) 88.4 - 84.4 %   Platelets 211 150 - 400 K/uL   nRBC 0.0 0.0 - 0.2 %   Neutrophils Relative % 92 %   Neutro Abs 7.9 (H) 1.7 - 7.7 K/uL   Lymphocytes Relative 5 %   Lymphs Abs 0.4 (L) 0.7 - 4.0 K/uL   Monocytes Relative 2 %   Monocytes Absolute 0.2 0.1 - 1.0 K/uL   Eosinophils Relative 0 %   Eosinophils Absolute 0.0 0.0 - 0.5 K/uL   Basophils Relative 0 %   Basophils Absolute 0.0 0.0 - 0.1 K/uL   Immature Granulocytes 1 %   Abs Immature Granulocytes 0.06 0.00 - 0.07 K/uL    Comment: Performed at [facility], [address] [place], [place] [address]  Basic metabolic panel with GFR     Status: Abnormal   Collection Time: 12/25/24  5:41 AM  Result Value Ref Range   Sodium 137 135 - 145 mmol/L   Potassium 4.8 3.5 - 5.1 mmol/L   Chloride 105 98 - 111 mmol/L   CO2 19 (L) 22 - 32 mmol/L   Glucose, Bld 252 (H) 70 - 99 mg/dL    Comment: Glucose reference range applies only to samples taken after fasting for at least 8 hours.   BUN 19 8 - 23 mg/dL   Creatinine, Ser 8.82 (H) 0.44 - 1.00 mg/dL   Calcium  6.6 (L) 8.9 - 10.3 mg/dL   GFR, Estimated 48 (L) >60 mL/min    Comment: (NOTE) Calculated using the CKD-EPI Creatinine Equation (2021)    Anion gap 12 5 - 15     Comment: Performed at [facility], [address] [place], [place] [address]  APTT     Status: Abnormal   Collection Time: 12/25/24  5:41 AM  Result Value Ref Range   aPTT 87 (H) 24 - 36 seconds    Comment:        IF BASELINE aPTT IS ELEVATED, SUGGEST PATIENT RISK ASSESSMENT BE USED TO DETERMINE APPROPRIATE ANTICOAGULANT THERAPY. Performed at [facility], [address] [place], [place] [address]   Glucose, capillary     Status: Abnormal   Collection Time: 12/25/24  8:42 AM  Result Value Ref Range   Glucose-Capillary 195 (H) 70 - 99 mg/dL    Comment: Glucose reference range applies only to samples taken after fasting for at least 8 hours.  Glucose, capillary     Status: Abnormal   Collection Time: 12/25/24 11:55 AM  Result Value Ref Range   Glucose-Capillary 174 (H) 70 - 99 mg/dL    Comment: Glucose reference range applies only to samples taken after fasting for at least 8 hours.   PERIPHERAL VASCULAR CATHETERIZATION Result Date: 12/25/2024 See surgical note for result.   Blood pressure 109/71, pulse 65, temperature (!) 97.5 F (36.4 C), temperature source Oral, resp. rate 13, height 5' 1 (1.549 m), weight 54.4 kg, SpO2 95%.  Assessment Left posterior heel ulceration status postdebridement, heel bone exposed Right hallux stable eschar  Plan - Patient seen and examined - Cultures so far with no growth to date.  Path pending.  No evidence  for osteomyelitis clinically or radiographically. - Wound VAC in place.  Recommend changes 3 times weekly.  Orders placed. - PT has been ordered.  If patient can weight-bear then may apply some pressure to the ball of the foot and left foot with a surgical shoe.  If she cannot and is bedbound and needs to still be wearing the Prevalon boot.  Remove surgical shoe today and applied pillow underneath the leg/heel to avoid pressure.  Should be doing this similarly on the right side. - Appreciate vascular recommendations. -  Do not believe antibiotics are indicated if no signs of infection are present.  Will follow-up on culture and path report as outpatient unless patient is still in the hospital next week.  Podiatry team to sign off at this time.  Patient may follow-up in outpatient clinic in 1 to 2 weeks.  Prentice Lee, DPM 12/25/2024, 12:46 PM           [1]  Allergies Allergen Reactions   Augmentin [Amoxicillin-Pot Clavulanate] Other (See Comments)    No reaction listed on MAR   Doxycycline  Other (See Comments)    No reaction listed on MAR   Lisinopril  Other (See Comments)    No reaction listed on MAR   Norvasc [Amlodipine] Other (See Comments)    No reaction listed on [facility]

## 2024-12-25 NOTE — TOC Initial Note (Addendum)
 Transition of Care [facility]) - Initial/Assessment Note    Patient Details  Name: Madison Davidson MRN: [id number] Date of Birth: 03/23/1951  [facility] CM/SW Contact:    Alvaro Louder, LCSW Phone Number: 12/25/2024, 1:30 PM  Clinical Narrative:     Patient is from home, PCP is  Vishu Mallipeddi. She has selected [facility]. They will follow at discharge.   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, unspecified ulcer stage (HCC) 12/22/2024   Lethargy 10/21/2024   Acute metabolic encephalopathy 10/21/2024   Lactic acidosis 10/21/2024   Cystitis 10/21/2024   Hypernatremia 10/21/2024   PAF (paroxysmal atrial fibrillation) (HCC) 07/28/2024   Long term (current) use of anticoagulants 07/28/2024   ERRONEOUS ENCOUNTER--DISREGARD 04/30/2024   Paroxysmal atrial fibrillation with RVR (HCC) 06/05/2022   Elevated troponin 06/05/2022   Hypoglycemia 01/26/2022   Coronary artery disease    History of CVA (cerebrovascular accident)    Left knee pain    Generalized weakness    Syncope 06/11/2021   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   Intracranial atherosclerosis 05/11/2021   Cerebral thrombosis with cerebral infarction 05/11/2021   Slurred speech 05/08/2021   Hyperlipidemia, mixed 05/08/2021   Uncontrolled type 2 diabetes mellitus with  hypoglycemia, without long-term current use of insulin  (HCC) 05/08/2021   AKI (acute kidney injury) 05/08/2021   Anemia of chronic disease 05/08/2021   CVA (cerebral vascular accident) (HCC) 05/08/2021   Hypertension 08/09/2015   PCP:  [facility]:   [facility] [address] [place] - [address] [place] [address] Phone: [phone] Fax: [phone]     Social Drivers of Health (SDOH) Social History: SDOH Screenings   Food Insecurity: No Food Insecurity (12/23/2024)  Housing: Low Risk (12/23/2024)  Transportation Needs: No Transportation Needs (12/23/2024)  Utilities: Not At Risk (12/23/2024)  Social Connections: Unknown (12/23/2024)  Tobacco Use: Low Risk (12/24/2024)   SDOH Interventions:     Readmission Risk Interventions    10/21/2024   10:37 AM  Readmission Risk Prevention Plan  Transportation Screening Complete  PCP or Specialist Appt within 3-5 Days Complete  HRI or Home Care Consult Complete  Social Work Consult for Recovery Care Planning/Counseling Complete  Palliative Care Screening Not Applicable  Medication Review [employer]) Complete

## 2024-12-25 NOTE — Interval H&P Note (Signed)
 History and Physical Interval Note:  12/25/2024 9:05 AM  Madison Davidson  has presented today for surgery, with the diagnosis of PAD with ulceration left foot.  The various methods of treatment have been discussed with the patient and family. After consideration of risks, benefits and other options for treatment, the patient has consented to  Procedures: Lower Extremity Angiography (Left) as a surgical intervention.  The patient's history has been reviewed, patient examined, no change in status, stable for surgery.  I have reviewed the patient's chart and labs.  Questions were answered to the patient's satisfaction.     Albi Rappaport

## 2024-12-25 NOTE — Progress Notes (Signed)
 PHARMACY - ANTICOAGULATION CONSULT NOTE  Pharmacy Consult for Heparin   Indication: atrial fibrillation  Allergies[1]  Patient Measurements: Height: 5' 1 (154.9 cm) Weight: 54.4 kg (119 lb 14.9 oz) IBW/kg (Calculated) : 47.8 HEPARIN  DW (KG): 54.4  Vital Signs: Temp: 97.6 F (36.4 C) (01/30 0547) BP: 138/56 (01/30 0547) Pulse Rate: 65 (01/30 0547)  Labs: Recent Labs    12/22/24 1139 12/23/24 0501 12/23/24 0805 12/23/24 1612 12/24/24 0236 12/24/24 1305 12/25/24 0541  HGB 9.3* 7.5*  --   --  7.3*  --  7.2*  HCT 30.0* 23.0*  --   --  23.7*  --  23.0*  PLT 269 222  --   --  223  --  211  APTT  --   --  92*   < > 118* 88* 87*  HEPARINUNFRC  --   --  >1.10*  --  >1.10*  --  >1.10*  CREATININE 1.51* 1.20*  --   --  1.12*  --   --    < > = values in this interval not displayed.    Estimated Creatinine Clearance: 32.7 mL/min (A) (by C-G formula based on SCr of 1.12 mg/dL (H)).   Medical History: Past Medical History:  Diagnosis Date   Cancer (HCC)    skin   Coronary artery disease    Diabetes mellitus without complication (HCC)    Hypertension    Stroke (HCC)     Medications:  Medications Prior to Admission  Medication Sig Dispense Refill Last Dose/Taking   acetaminophen  (TYLENOL ) 325 MG tablet Take 2 tablets (650 mg total) by mouth every 6 (six) hours as needed for mild pain, fever or headache.   Unknown   amiodarone  (PACERONE ) 200 MG tablet Take 1 tablet (200 mg total) by mouth 2 (two) times daily.   12/22/2024 Morning   apixaban  (ELIQUIS ) 5 MG TABS tablet Take 1 tablet (5 mg total) by mouth 2 (two) times daily. 60 tablet 3 12/22/2024 Morning   atorvastatin  (LIPITOR ) 40 MG tablet Take 1 tablet (40 mg total) by mouth daily.   12/21/2024 Bedtime   divalproex  (DEPAKOTE ) 125 MG DR tablet Take 125 mg by mouth 2 (two) times daily.   12/22/2024 Morning   famotidine  (PEPCID ) 20 MG tablet Take 20 mg by mouth at bedtime.   12/21/2024   levothyroxine  (SYNTHROID ) 50 MCG tablet  Take 50 mcg by mouth every other day.   Past Week   levothyroxine  (SYNTHROID ) 75 MCG tablet Take 75 mcg by mouth every other day.   Past Week   lisinopril  (ZESTRIL ) 20 MG tablet Take 1 tablet (20 mg total) by mouth daily. 30 tablet 0 12/22/2024 Morning   melatonin 3 MG TABS tablet Take 2 tablets (6 mg total) by mouth at bedtime as needed.   Unknown   metFORMIN  (GLUCOPHAGE ) 1000 MG tablet Take 1 tablet (1,000 mg total) by mouth 2 (two) times daily with a meal. 60 tablet 0 12/22/2024 Morning   mirtazapine  (REMERON ) 7.5 MG tablet Take 7.5 mg by mouth at bedtime.   12/21/2024   omeprazole (PRILOSEC) 40 MG capsule Take 40 mg by mouth daily.   Unknown   ondansetron  (ZOFRAN ) 4 MG tablet Take 4 mg by mouth every 6 (six) hours as needed for nausea or vomiting.   Unknown   polyethylene glycol (MIRALAX  / GLYCOLAX ) 17 g packet Take 17 g by mouth daily as needed for mild constipation.   Unknown   cholecalciferol (VITAMIN D3) 25 MCG (1000 UNIT) tablet Take 1,000  Units by mouth daily. (Patient not taking: Reported on 12/22/2024)   Not Taking   ferrous sulfate  325 (65 FE) MG tablet Take 325 mg by mouth every Monday, Wednesday, and Friday. (Patient not taking: Reported on 12/22/2024)   Not Taking   folic acid  (FOLVITE ) 1 MG tablet Take 1 tablet (1 mg total) by mouth daily. (Patient not taking: Reported on 12/22/2024)   Not Taking   vitamin B-12 (VITAMIN B12) 500 MCG tablet Take 1 tablet (500 mcg total) by mouth daily. (Patient not taking: Reported on 12/22/2024)   Not Taking    Assessment: Pharmacy consulted to dose heparin  in this 74 year old female with Afib, possible upcoming surgery.   Pt was on Eliquis  5 mg PO BID PTA,  last dose on 1/27 AM.  CrCl = 24.3 ml/min   Date Time HL  /  aPTT Rate/Comment 1/28 0805 >1.10 / 92 aPTT therapeutic on 800 units/hour 1/28 1612 --  /  107 aPTT Supratherapeutic on 800 units/hr 1/29     0236   > 1.10 / 118     aPTT SUPRAtherapeutic 750 units/hr 1/29 1305 -- / 88  aPTT therapeutic  on 650 units/hour 1/30     0541   > 1.10 / 87       aPTT therapeutic X 2 @ 650 units/hr  Goal of Therapy:  Heparin  level 0.3-0.7 units/ml aPTT 66 - 102  seconds Monitor platelets by anticoagulation protocol: Yes   Plan:  1/30 @ 0541:  aPTT = 87,   HL = > 1.10 - aPTT therapeutic X 2, not correlating with HL  - Will continue pt on current rate and recheck HL and aPTT on 1/31 with AM labs  - Will use aPTT to guide dosing until correlating with HL - Daily heparin  levels and CBC while on heparin  infusion  Thank you for involving pharmacy in this patient's care.   Shonte Soderlund D, PharmD Clinical Pharmacist 12/25/2024 7:00 AM       [1]  Allergies Allergen Reactions   Augmentin [Amoxicillin-Pot Clavulanate] Other (See Comments)    No reaction listed on MAR   Doxycycline  Other (See Comments)    No reaction listed on MAR   Lisinopril  Other (See Comments)    No reaction listed on MAR   Norvasc [Amlodipine] Other (See Comments)    No reaction listed on [facility]

## 2024-12-25 NOTE — Plan of Care (Signed)

## 2024-12-25 NOTE — Inpatient Diabetes Management (Signed)
 Inpatient Diabetes Program Recommendations  AACE/ADA: New Consensus Statement on Inpatient Glycemic Control   Target Ranges:  Prepandial:   less than 140 mg/dL      Peak postprandial:   less than 180 mg/dL (1-2 hours)      Critically ill patients:  140 - 180 mg/dL    Latest Reference Range & Units 12/24/24 04:04 12/24/24 08:40 12/24/24 10:52 12/24/24 12:05 12/24/24 15:54 12/24/24 17:30 12/24/24 20:42 12/25/24 00:08 12/25/24 05:12  Glucose-Capillary 70 - 99 mg/dL 85 72 845 (H) 886 (H) 78 111 (H) 256 (H) 292 (H) 239 (H)   Review of Glycemic Control  Diabetes history: DM2 Outpatient Diabetes medications: Metformin  1000 mg BID Current orders for Inpatient glycemic control: Novolog  0-9 units TID with meals  Inpatient Diabetes Program Recommendations:    Insulin : Patient received Decadron  6 mg at 16:44 on 1/29 and NO Novolog  correction was given at all on 12/24/24. As a result, CBG 239 mg/dl this morning. Would not recommend any changes with insulin  orders at this time.   Thanks, Earnie Gainer, RN, MSN, CDCES Diabetes Coordinator Inpatient Diabetes Program [phone] (Team Pager from 8am to 5pm)

## 2024-12-25 NOTE — Progress Notes (Signed)
 " Progress Note   Patient: Madison Davidson FMW:[id number] DOB: 11-Sep-1951 DOA: 12/22/2024     1 DOS: the patient was seen and examined on 12/25/2024     Brief hospital course: From HPI Madison Davidson is a 74 y.o. female with medical history significant of right MCA CVA status post stenting, PAF on Eliquis , HTN, CAD, HTN, hypothyroidism, presented with worsening of left heel wound and dehydration.   History was provided by daughter at bedside.  Patient has had intermittent left leg pain since sometime earlier last year, gradually her ambulation significantly affected and she has become wheelchair-bound for 6+ months.  2 months ago she developed left heel wound, began with a bloody blister, at some point it ruptured and patient started develop a ulcer with callus on top of it.  Daughter went to see patient on the weekend and noticed the patient developed severe pain and swelling of left heel which was new.  Patient was evaluated by vascular surgeon who ordered outpatient ABI study scheduled for next week.  In addition, daughter also noticed patient p.o. intake has significant decreased over the weekend and appeared to be very dehydrated today which prompted her to send patient to ED.   ED Course: Afebrile, nontachycardic blood pressure 128/51.  X-ray showed no signs of osteomyelitis of left foot, blood work showed WBC 11.0 hemoglobin 9.3 BUN 32 creatinine 1.5 compared to baseline less than 1.0, K5.3.   Patient was given IV bolus x 1 and started on vancomycin  and Zosyn  in the ED.      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 Patient underwent surgical intervention by podiatry involving a left heel wound debridement and application of left heel wound VAC on 12/24/2024 Patient was also seen by vascular surgeon on 12/25/2024 and underwent angiography with  stent placement - Continue Zosyn  Patient Continue as needed pain medication   AKI-improved Hyperkalemia-improved -Likely prerenal secondary to dehydration, Monitor renal function closely  PAF - In sinus rhythm - Hold off Eliquis  for possible incoming podiatry intervention   CVA -start ASA - Continue statin   Hypothyroidism - Continue Synthroid    IIDM - Continue metformin  - SSI   DVT prophylaxis: Heparin  subcu Code Status: DNR/DNI   Disposition Plan: Pending podiatry procedure   Consults called: Podiatry       Subjective:  Patient seen and examined at bedside this morning Underwent surgical intervention yesterday Denies chest pain nausea or vomiting   Physical Exam: General: Seen and examined in no acute distress Respiratory: clear to auscultation bilaterally, no wheezing, no crackles. Normal respiratory effort. No accessory muscle use.  Cardiovascular: Regular rate and rhythm, no murmurs / rubs / gallops. No extremity edema. 2+ pedal pulses. No carotid bruits.  Abdomen: no tenderness, no masses palpated. No hepatosplenomegaly. Bowel sounds positive.  Musculoskeletal: Dressing noted to the foot with clean dressing Neurologic: CN 2-12 grossly intact. Sensation intact, DTR normal. Strength 5/5 in all 4.  Psychiatric: Normal judgment and insight. Alert and oriented x 3. Normal mood.     Data Reviewed:   Vitals:   12/25/24 1100 12/25/24 1115 12/25/24 1153 12/25/24 1416  BP: (!) 110/48 (!) 105/45 109/71 (!) 100/37  Pulse: 65 64 65 65  Resp: 14 13  15   Temp:  (!) 97.2 F (36.2 C) (!) 97.5 F (36.4 C) 97.8 F (36.6 C)  TempSrc:  Temporal Oral  SpO2: 93% 94% 95% 99%  Weight:      Height:          Latest Ref Rng & Units 12/25/2024    5:41 AM 12/24/2024    2:36 AM 12/23/2024    5:01 AM  CBC  WBC 4.0 - 10.5 K/uL 8.5  8.7  8.3   Hemoglobin 12.0 - 15.0 g/dL 7.2  7.3  7.5   Hematocrit 36.0 - 46.0 % 23.0  23.7  23.0   Platelets 150 - 400 K/uL 211  223  222         Latest Ref Rng & Units 12/25/2024    5:41 AM 12/24/2024    2:36 AM 12/23/2024    5:01 AM  BMP  Glucose 70 - 99 mg/dL 747  886  78   BUN 8 - 23 mg/dL 19  20  27    Creatinine 0.44 - 1.00 mg/dL 8.82  8.87  8.79   Sodium 135 - 145 mmol/L 137  142  143   Potassium 3.5 - 5.1 mmol/L 4.8  5.0  4.6   Chloride 98 - 111 mmol/L 105  110  111   CO2 22 - 32 mmol/L 19  21  22    Calcium  8.9 - 10.3 mg/dL 6.6  6.6  6.6      Author: Drue ONEIDA Potter, MD 12/25/2024 4:35 PM  For on call review [email or web address].  "

## 2024-12-26 ENCOUNTER — Inpatient Hospital Stay

## 2024-12-26 DIAGNOSIS — L97529 Non-pressure chronic ulcer of other part of left foot with unspecified severity: Secondary | ICD-10-CM | POA: Diagnosis not present

## 2024-12-26 LAB — CBC WITH DIFFERENTIAL/PLATELET
Abs Immature Granulocytes: 0.08 10*3/uL — ABNORMAL HIGH (ref 0.00–0.07)
Basophils Absolute: 0 10*3/uL (ref 0.0–0.1)
Basophils Relative: 0 %
Eosinophils Absolute: 0.1 10*3/uL (ref 0.0–0.5)
Eosinophils Relative: 0 %
HCT: 25.9 % — ABNORMAL LOW (ref 36.0–46.0)
Hemoglobin: 8.1 g/dL — ABNORMAL LOW (ref 12.0–15.0)
Immature Granulocytes: 1 %
Lymphocytes Relative: 14 %
Lymphs Abs: 1.7 10*3/uL (ref 0.7–4.0)
MCH: 27.2 pg (ref 26.0–34.0)
MCHC: 31.3 g/dL (ref 30.0–36.0)
MCV: 86.9 fL (ref 80.0–100.0)
Monocytes Absolute: 1.2 10*3/uL — ABNORMAL HIGH (ref 0.1–1.0)
Monocytes Relative: 10 %
Neutro Abs: 9.2 10*3/uL — ABNORMAL HIGH (ref 1.7–7.7)
Neutrophils Relative %: 75 %
Platelets: 253 10*3/uL (ref 150–400)
RBC: 2.98 MIL/uL — ABNORMAL LOW (ref 3.87–5.11)
RDW: 16.1 % — ABNORMAL HIGH (ref 11.5–15.5)
WBC: 12.3 10*3/uL — ABNORMAL HIGH (ref 4.0–10.5)
nRBC: 0 % (ref 0.0–0.2)

## 2024-12-26 LAB — GLUCOSE, CAPILLARY
Glucose-Capillary: 103 mg/dL — ABNORMAL HIGH (ref 70–99)
Glucose-Capillary: 137 mg/dL — ABNORMAL HIGH (ref 70–99)
Glucose-Capillary: 158 mg/dL — ABNORMAL HIGH (ref 70–99)
Glucose-Capillary: 78 mg/dL (ref 70–99)
Glucose-Capillary: 86 mg/dL (ref 70–99)
Glucose-Capillary: 90 mg/dL (ref 70–99)

## 2024-12-26 LAB — HEPARIN LEVEL (UNFRACTIONATED): Heparin Unfractionated: 1.04 [IU]/mL — ABNORMAL HIGH (ref 0.30–0.70)

## 2024-12-26 LAB — BASIC METABOLIC PANEL WITH GFR
Anion gap: 12 (ref 5–15)
BUN: 16 mg/dL (ref 8–23)
CO2: 22 mmol/L (ref 22–32)
Calcium: 7.1 mg/dL — ABNORMAL LOW (ref 8.9–10.3)
Chloride: 109 mmol/L (ref 98–111)
Creatinine, Ser: 1.13 mg/dL — ABNORMAL HIGH (ref 0.44–1.00)
GFR, Estimated: 50 mL/min — ABNORMAL LOW
Glucose, Bld: 90 mg/dL (ref 70–99)
Potassium: 5.2 mmol/L — ABNORMAL HIGH (ref 3.5–5.1)
Sodium: 143 mmol/L (ref 135–145)

## 2024-12-26 LAB — APTT: aPTT: 74 s — ABNORMAL HIGH (ref 24–36)

## 2024-12-26 MED ORDER — SODIUM CHLORIDE 0.9 % IV SOLN
100.0000 mg | Freq: Two times a day (BID) | INTRAVENOUS | Status: DC
  Administered 2024-12-26 – 2024-12-30 (×9): 100 mg via INTRAVENOUS
  Filled 2024-12-26 (×9): qty 100

## 2024-12-26 MED ORDER — GERHARDT'S BUTT CREAM
TOPICAL_CREAM | Freq: Two times a day (BID) | CUTANEOUS | Status: DC
  Filled 2024-12-26: qty 60

## 2024-12-26 MED ORDER — APIXABAN 5 MG PO TABS
5.0000 mg | ORAL_TABLET | Freq: Two times a day (BID) | ORAL | Status: DC
  Administered 2024-12-26 – 2024-12-29 (×6): 5 mg via ORAL
  Filled 2024-12-26 (×6): qty 1

## 2024-12-26 NOTE — Evaluation (Signed)
 Physical Therapy Evaluation Patient Details Name: Madison Davidson MRN: [id number] DOB: 31-Mar-1951 Today's Date: 12/26/2024  History of Present Illness  Pt admitted for L foot ulcer and is s/p wound debridement on 12/24/24 and POD 1 s/p angio and stent placement on 12/25/24. PMH of MCA CVA, HTN, CAD, and HTN.  Clinical Impression  Pt is a pleasant 74 year old female who was admitted for L foot ulcer s/p wound debridement and angiogram with stent placement. Pt performs bed mobility with min assist and once performing mobility, noted active bleeding from incision site. RN notified and came in room to address. Bed mobility performed and assist RN with hygiene. Per request, OOB mobility deferred at this time. Pt demonstrates deficits with strength/mobility. Would benefit from skilled PT to address above deficits and promote optimal return to PLOF. Pt will continue to receive skilled PT services while admitted and will defer to TOC/care team for updates regarding disposition planning.       If plan is discharge home, recommend the following: A lot of help with walking and/or transfers;A lot of help with bathing/dressing/bathroom   Can travel by private vehicle   No    Equipment Recommendations Hospital bed;Rolling walker (2 wheels)  Recommendations for Other Services       Functional Status Assessment Patient has had a recent decline in their functional status and demonstrates the ability to make significant improvements in function in a reasonable and predictable amount of time.     Precautions / Restrictions Precautions Precautions: Fall Recall of Precautions/Restrictions: Intact Required Braces or Orthoses:  (post op shoe) Restrictions Weight Bearing Restrictions Per Provider Order: Yes LLE Weight Bearing Per Provider Order: Touchdown weight bearing LLE Partial Weight Bearing Percentage or Pounds: Toe touch for short distances per order Other Position/Activity Restrictions: with shoe  donned      Mobility  Bed Mobility Overal bed mobility: Needs Assistance Bed Mobility: Rolling Rolling: Min assist         General bed mobility comments: needs assist for scooting up in bed/repositioning. Follows commands well. Upon rolling, discovered that surgical site actively bleeding. RN notified and assisted with dressing change. Requested to limit to bed mobility this date due to active bleeding    Transfers                   General transfer comment: not performed per RN request    Ambulation/Gait                  Stairs            Wheelchair Mobility     Tilt Bed    Modified Rankin (Stroke Patients Only)       Balance                                             Pertinent Vitals/Pain Pain Assessment Pain Assessment: Faces Faces Pain Scale: Hurts little more Pain Location: R groin Pain Descriptors / Indicators: Operative site guarding Pain Intervention(s): Limited activity within patient's tolerance, Repositioned    Home Living Family/patient expects to be discharged to:: Private residence Living Arrangements: Children Available Help at Discharge: Family (daughter lives with patient, however works during day. During that pt, pt home alone) Type of Home: House Home Access: Stairs to enter Entrance Stairs-Rails: None Entrance Stairs-Number of Steps: 3   Home Layout: One level  Home Equipment: Wheelchair - manual;BSC/3in1      Prior Function Prior Level of Function : Needs assist             Mobility Comments: reports no falls, however limited mobility. Reports she was hopping due to L foot pain, however doubt that is accurate given presentation. Per chart, reports she is WC bound ADLs Comments: reports she needs assistance     Extremity/Trunk Assessment   Upper Extremity Assessment Upper Extremity Assessment: Generalized weakness    Lower Extremity Assessment Lower Extremity Assessment:  Generalized weakness       Communication   Communication Communication: No apparent difficulties    Cognition Arousal: Alert Behavior During Therapy: WFL for tasks assessed/performed   PT - Cognitive impairments: History of cognitive impairments                       PT - Cognition Comments: alert, however questionable cognition, poor safety awareness Following commands: Intact       Cueing Cueing Techniques: Verbal cues     General Comments      Exercises Other Exercises Other Exercises: multiple bouts of rolling to change bed linen, replace bandages and reposition in bed. Pt able to roll with min assist. Prevlon boots donned after mobility efforts   Assessment/Plan    PT Assessment Patient needs continued PT services  PT Problem List Decreased strength;Decreased activity tolerance;Decreased balance;Decreased mobility;Decreased safety awareness;Decreased knowledge of precautions;Pain       PT Treatment Interventions Gait training;DME instruction;Therapeutic activities;Therapeutic exercise;Stair training;Balance training;Wheelchair mobility training    PT Goals (Current goals can be found in the Care Plan section)  Acute Rehab PT Goals Patient Stated Goal: to go home no matter what PT Goal Formulation: With patient Time For Goal Achievement: 01/09/25 Potential to Achieve Goals: Good    Frequency Min 2X/week     Co-evaluation               AM-PAC PT 6 Clicks Mobility  Outcome Measure Help needed turning from your back to your side while in a flat bed without using bedrails?: A Little Help needed moving from lying on your back to sitting on the side of a flat bed without using bedrails?: A Lot Help needed moving to and from a bed to a chair (including a wheelchair)?: A Lot Help needed standing up from a chair using your arms (e.g., wheelchair or bedside chair)?: A Lot Help needed to walk in hospital room?: Total Help needed climbing 3-5 steps  with a railing? : Total 6 Click Score: 11    End of Session   Activity Tolerance: Patient tolerated treatment well Patient left: in bed;with bed alarm set;with nursing/sitter in room Nurse Communication: Mobility status PT Visit Diagnosis: Muscle weakness (generalized) (M62.81);Difficulty in walking, not elsewhere classified (R26.2);Pain Pain - Right/Left: Right Pain - part of body:  (groin)    Time: 8957-8889 PT Time Calculation (min) (ACUTE ONLY): 28 min   Charges:   PT Evaluation $PT Eval Low Complexity: 1 Low PT Treatments $Therapeutic Activity: 8-22 mins PT General Charges $$ ACUTE PT VISIT: 1 Visit         Corean Dade, PT, DPT, GCS [phone]   Jaymes Hang 12/26/2024, 11:50 AM

## 2024-12-26 NOTE — Consult Note (Addendum)
.  w CLINICAL SUPPORT TEAM - WOUND OSTOMY AND CONTINENCE TEAM  CONSULTATION SERVICES   WOC Nurse-Inpatient Note  WOC Nurse Consult Note: Reason for Consult: L buttock wound  Wound type: 1.  Stage 2 Pressure Injury L buttock red moist  2.  Eschar to B great toes likely r/t PAD (evaluated by podiatry on 1/27 with order for Betadine)  Pressure Injury POA: no  Measurement: see nursing flowsheet  Wound bed: as above  Drainage (amount, consistency, odor) serosanguinous  Periwound: ? Purple maroon discoloration developing DTPI  Dressing procedure/placement/frequency:  Cleanse L buttock wound with NS, apply silver hydrofiber (TI#[id number]/Ojtdnw #[id number]) to wound bed daily and secure with silicone foam.  Paint B great toe eschar with Betadine 2 times a day and allow to dry. May leave open to air or cover with dry gauze and tape.    POC discussed with bedside nurse. WOC team following patient for management of L heel wound vac. Will assess buttocks wound in person to rule out deep tissue.    Thank you,    Powell Bar MSN, RN-BC, [employer]

## 2024-12-26 NOTE — Progress Notes (Addendum)
 1 Day Post-Op   Subjective/Chief Complaint: Denies pain   Objective: Vital signs in last 24 hours: Temp:  [97 F (36.1 C)-97.8 F (36.6 C)] 97.3 F (36.3 C) (01/31 0336) Pulse Rate:  [59-79] 65 (01/31 0336) Resp:  [13-23] 18 (01/31 0336) BP: (93-163)/(37-138) 138/60 (01/31 0336) SpO2:  [92 %-100 %] 100 % (01/31 0336) Last BM Date : 12/24/24  Intake/Output from previous day: 01/30 0701 - 01/31 0700 In: 366.5 [P.O.:240; I.V.:64.6; IV Piggyback:61.9] Out: 300 [Urine:300] Intake/Output this shift: No intake/output data recorded.  General appearance: alert Extremities: RIGHT groin access site soft, no hematoma, LEFT leg warm, LEFT foot dressing in place.  Lab Results:  Recent Labs    12/25/24 0541 12/26/24 0802  WBC 8.5 12.3*  HGB 7.2* 8.1*  HCT 23.0* 25.9*  PLT 211 253   BMET Recent Labs    12/25/24 0541 12/26/24 0802  NA 137 143  K 4.8 5.2*  CL 105 109  CO2 19* 22  GLUCOSE 252* 90  BUN 19 16  CREATININE 1.17* 1.13*  CALCIUM  6.6* 7.1*   PT/INR No results for input(s): LABPROT, INR in the last 72 hours. ABG No results for input(s): PHART, HCO3 in the last 72 hours.  Invalid input(s): PCO2, PO2  Studies/Results: PERIPHERAL VASCULAR CATHETERIZATION Result Date: 12/25/2024 See surgical note for result.   Anti-infectives: Anti-infectives (From admission, onward)    Start     Dose/Rate Route Frequency Ordered Stop   12/22/24 2200  piperacillin -tazobactam (ZOSYN ) IVPB 3.375 g        3.375 g 12.5 mL/hr over 240 Minutes Intravenous Every 8 hours 12/22/24 1532     12/22/24 1300  piperacillin -tazobactam (ZOSYN ) IVPB 3.375 g        3.375 g 100 mL/hr over 30 Minutes Intravenous  Once 12/22/24 1256 12/22/24 1338   12/22/24 1300  vancomycin  (VANCOREADY) IVPB 1250 mg/250 mL        1,250 mg 166.7 mL/hr over 90 Minutes Intravenous  Once 12/22/24 1256         Assessment/Plan: s/p Procedures: Lower Extremity Angiography (Left)  ASA/Plavix  once  no further Podiatry interventions planned Continue supportive care   LOS: 2 days    Tisa Dakin A 12/26/2024

## 2024-12-26 NOTE — Plan of Care (Signed)
   Problem: Education: Goal: Ability to describe self-care measures that may prevent or decrease complications (Diabetes Survival Skills Education) will improve Outcome: Progressing   Problem: Coping: Goal: Ability to adjust to condition or change in health will improve Outcome: Progressing   Problem: Fluid Volume: Goal: Ability to maintain a balanced intake and output will improve Outcome: Progressing

## 2024-12-26 NOTE — Progress Notes (Addendum)
 " Progress Note   Patient: Madison Davidson FMW:[id number] DOB: Apr 23, 1951 DOA: 12/22/2024     2 DOS: the patient was seen and examined on 12/26/2024     Brief hospital course: From HPI Rhanda A Menard is a 74 y.o. female with medical history significant of right MCA CVA status post stenting, PAF on Eliquis , HTN, CAD, HTN, hypothyroidism, presented with worsening of left heel wound and dehydration.   History was provided by daughter at bedside.  Patient has had intermittent left leg pain since sometime earlier last year, gradually her ambulation significantly affected and she has become wheelchair-bound for 6+ months.  2 months ago she developed left heel wound, began with a bloody blister, at some point it ruptured and patient started develop a ulcer with callus on top of it.  Daughter went to see patient on the weekend and noticed the patient developed severe pain and swelling of left heel which was new.  Patient was evaluated by vascular surgeon who ordered outpatient ABI study scheduled for next week.  In addition, daughter also noticed patient p.o. intake has significant decreased over the weekend and appeared to be very dehydrated today which prompted her to send patient to ED.   ED Course: Afebrile, nontachycardic blood pressure 128/51.  X-ray showed no signs of osteomyelitis of left foot, blood work showed WBC 11.0 hemoglobin 9.3 BUN 32 creatinine 1.5 compared to baseline less than 1.0, K5.3.   Patient was given IV bolus x 1 and started on vancomycin  and Zosyn  in the ED.      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 Patient underwent surgical intervention by podiatry involving a left heel wound debridement and application of left heel wound VAC on 12/24/2024 Wound culture showed Staphylococcus as well as Pseudomonas aeruginosa Sensitivity  pending Patient was also seen by vascular surgeon on 12/25/2024 and underwent angiography with stent placement - Continue Zosyn  And doxycycline  Patient Continue as needed pain medication   AKI-improved Hyperkalemia-improved -Likely prerenal secondary to dehydration, Monitor renal function closely   PAF - In sinus rhythm Resumed Eliquis    CVA -start ASA - Continue statin   Hypothyroidism - Continue Synthroid    IIDM - Continue metformin  - SSI   DVT prophylaxis: On Eliquis  Code Status: DNR/DNI   Disposition Plan: Pending placement   Consults called: Podiatry       Subjective:  Seen and examined at bedside this morning Denies nausea vomiting chest pain cough abdominal pain   Physical Exam: General: Seen and examined in no acute distress Respiratory: clear to auscultation bilaterally, no wheezing Abdomen: no tenderness, no masses palpated. No hepatosplenomegaly. Bowel sounds positive.  Musculoskeletal: Dressing noted to the foot with clean dressing Neurologic: CN 2-12 grossly intact. Sensation intact, DTR normal. Strength 5/5 in all 4.  Psychiatric: Normal judgment and insight. Alert and oriented x 3. Normal mood.     Data Reviewed:      Latest Ref Rng & Units 12/26/2024    8:02 AM 12/25/2024    5:41 AM 12/24/2024    2:36 AM  CBC  WBC 4.0 - 10.5 K/uL 12.3  8.5  8.7   Hemoglobin 12.0 - 15.0 g/dL 8.1  7.2  7.3   Hematocrit 36.0 - 46.0 % 25.9  23.0  23.7   Platelets 150 - 400 K/uL 253  211  223  Latest Ref Rng & Units 12/26/2024    8:02 AM 12/25/2024    5:41 AM 12/24/2024    2:36 AM  BMP  Glucose 70 - 99 mg/dL 90  747  886   BUN 8 - 23 mg/dL 16  19  20    Creatinine 0.44 - 1.00 mg/dL 8.86  8.82  8.87   Sodium 135 - 145 mmol/L 143  137  142   Potassium 3.5 - 5.1 mmol/L 5.2  4.8  5.0   Chloride 98 - 111 mmol/L 109  105  110   CO2 22 - 32 mmol/L 22  19  21    Calcium  8.9 - 10.3 mg/dL 7.1  6.6  6.6      Vitals:   12/25/24 1153 12/25/24 1416 12/25/24 1959  12/26/24 0336  BP: 109/71 (!) 100/37 93/68 138/60  Pulse: 65 65 (!) 59 65  Resp:  15 18 18   Temp: (!) 97.5 F (36.4 C) 97.8 F (36.6 C) 97.6 F (36.4 C) (!) 97.3 F (36.3 C)  TempSrc: Oral     SpO2: 95% 99% 100% 100%  Weight:      Height:         Author: Drue ONEIDA Potter, MD 12/26/2024 4:26 PM  For on call review [email or web address]  "

## 2024-12-27 DIAGNOSIS — L97529 Non-pressure chronic ulcer of other part of left foot with unspecified severity: Secondary | ICD-10-CM | POA: Diagnosis not present

## 2024-12-27 LAB — GLUCOSE, CAPILLARY
Glucose-Capillary: 147 mg/dL — ABNORMAL HIGH (ref 70–99)
Glucose-Capillary: 74 mg/dL (ref 70–99)
Glucose-Capillary: 77 mg/dL (ref 70–99)
Glucose-Capillary: 82 mg/dL (ref 70–99)
Glucose-Capillary: 84 mg/dL (ref 70–99)
Glucose-Capillary: 91 mg/dL (ref 70–99)
Glucose-Capillary: 94 mg/dL (ref 70–99)

## 2024-12-27 LAB — CULTURE, BLOOD (ROUTINE X 2)
Culture: NO GROWTH
Culture: NO GROWTH
Special Requests: ADEQUATE
Special Requests: ADEQUATE

## 2024-12-27 NOTE — Plan of Care (Signed)

## 2024-12-27 NOTE — Progress Notes (Signed)
 " Progress Note   Patient: Madison Davidson FMW:[id number] DOB: Jan 23, 1951 DOA: 12/22/2024     3 DOS: the patient was seen and examined on 12/27/2024     Brief hospital course: From HPI Madison Davidson is a 74 y.o. female with medical history significant of right MCA CVA status post stenting, PAF on Eliquis , HTN, CAD, HTN, hypothyroidism, presented with worsening of left heel wound and dehydration.   History was provided by daughter at bedside.  Patient has had intermittent left leg pain since sometime earlier last year, gradually her ambulation significantly affected and she has become wheelchair-bound for 6+ months.  2 months ago she developed left heel wound, began with a bloody blister, at some point it ruptured and patient started develop a ulcer with callus on top of it.  Daughter went to see patient on the weekend and noticed the patient developed severe pain and swelling of left heel which was new.  Patient was evaluated by vascular surgeon who ordered outpatient ABI study scheduled for next week.  In addition, daughter also noticed patient p.o. intake has significant decreased over the weekend and appeared to be very dehydrated today which prompted her to send patient to ED.   ED Course: Afebrile, nontachycardic blood pressure 128/51.  X-ray showed no signs of osteomyelitis of left foot, blood work showed WBC 11.0 hemoglobin 9.3 BUN 32 creatinine 1.5 compared to baseline less than 1.0, K5.3.   Patient was given IV bolus x 1 and started on vancomycin  and Zosyn  in the ED.      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 Patient underwent surgical intervention by podiatry involving a left heel wound debridement and application of left heel wound VAC on 12/24/2024 Wound culture showed Staphylococcus as well as Pseudomonas aeruginosa Sensitivity  pending Patient may need ID consult to guide therapy Patient was also seen by vascular surgeon on 12/25/2024 and underwent angiography with stent placement Continue Zosyn  and doxycycline  Continue as needed pain medication   AKI-improved Hyperkalemia-improved -Likely prerenal secondary to dehydration, Monitor renal function closely   PAF - In sinus rhythm Resumed Eliquis    CVA -start ASA - Continue statin   Hypothyroidism - Continue Synthroid    IIDM - Continue metformin  - SSI   DVT prophylaxis: On Eliquis  Code Status: DNR/DNI   Disposition Plan: SNF when medically stable   Consults called: Podiatry       Subjective:  Seen and examined at bedside this morning Denies nausea vomiting chest pain cough abdominal pain   Physical Exam: General: Seen and examined in no acute distress Respiratory: clear to auscultation bilaterally, no wheezing Abdomen: no tenderness, no masses palpated. No hepatosplenomegaly. Bowel sounds positive.  Musculoskeletal: Dressing noted to the foot with clean dressing Neurologic: CN 2-12 grossly intact. Sensation intact, DTR normal. Strength 5/5 in all 4.  Psychiatric: Normal judgment and insight. Alert and oriented x 3. Normal mood.     Data Reviewed:      Latest Ref Rng & Units 12/26/2024    8:02 AM 12/25/2024    5:41 AM 12/24/2024    2:36 AM  CBC  WBC 4.0 - 10.5 K/uL 12.3  8.5  8.7   Hemoglobin 12.0 - 15.0 g/dL 8.1  7.2  7.3   Hematocrit 36.0 - 46.0 % 25.9  23.0  23.7   Platelets 150 - 400 K/uL 253  211  223        Latest Ref Rng & Units 12/26/2024    8:02 AM 12/25/2024    5:41 AM 12/24/2024    2:36 AM  BMP  Glucose 70 - 99 mg/dL 90  747  886   BUN 8 - 23 mg/dL 16  19  20    Creatinine 0.44 - 1.00 mg/dL 8.86  8.82  8.87   Sodium 135 - 145 mmol/L 143  137  142   Potassium 3.5 - 5.1 mmol/L 5.2  4.8  5.0   Chloride 98 - 111 mmol/L 109  105  110   CO2 22 - 32 mmol/L 22  19  21    Calcium  8.9 - 10.3 mg/dL 7.1  6.6  6.6       Vitals:    12/26/24 0336 12/26/24 2040 12/27/24 0415 12/27/24 0831  BP: 138/60 (!) 143/67 (!) 147/59 (!) 157/124  Pulse: 65 72 76 80  Resp: 18 20 18 18   Temp: (!) 97.3 F (36.3 C) 98.1 F (36.7 C) 98.5 F (36.9 C) 98 F (36.7 C)  TempSrc:      SpO2: 100% 100% 100% 100%  Weight:      Height:         Author: Drue ONEIDA Potter, MD 12/27/2024 5:07 PM  For on call review [email or web address]  "

## 2024-12-27 NOTE — Progress Notes (Addendum)
 PODIATRY / FOOT AND ANKLE SURGERY PROGRESS NOTE  Requesting Physician: Dr. Laurita  Reason for consult: Left heel ulcer  Chief Complaint: Left heel pain/sore   HPI: Madison Davidson is a 74 y.o. female who presents today resting in bed comfortably.  Patient notes mild pain to left heel but otherwise is doing well.  She is tolerating the wound VAC well.  She is wearing Prevalon boots today.  PMHx:  Past Medical History:  Diagnosis Date   Cancer (HCC)    skin   Coronary artery disease    Diabetes mellitus without complication (HCC)    Hypertension    Stroke [facility])     Surgical Hx:  Past Surgical History:  Procedure Laterality Date   ABDOMINAL HYSTERECTOMY     APPLICATION OF WOUND VAC Left 12/24/2024   Procedure: APPLICATION, WOUND VAC;  Surgeon: Lennie Barter, DPM;  Location: [facility] ORS;  Service: Orthopedics/Podiatry;  Laterality: Left;  4x4x1.2   GRAFT APPLICATION  12/24/2024   Procedure: GRAFT APPLICATION;  Surgeon: Lennie Barter, DPM;  Location: [facility] ORS;  Service: Orthopedics/Podiatry;;   INCISION AND DRAINAGE OF WOUND Left 12/24/2024   Procedure: IRRIGATION AND DEBRIDEMENT WOUND;  Surgeon: Lennie Barter, DPM;  Location: [facility] ORS;  Service: Orthopedics/Podiatry;  Laterality: Left;   IR ANGIO INTRA EXTRACRAN SEL INTERNAL CAROTID BILAT MOD SED  05/11/2021   IR ANGIO VERTEBRAL SEL VERTEBRAL UNI R MOD SED  05/11/2021   IR ANGIOGRAM EXTREMITY LEFT  05/11/2021   IR CT HEAD LTD  05/11/2021   IR INTRA CRAN STENT  05/11/2021   IR INTRA CRAN STENT  05/12/2021   IR US  GUIDE VASC ACCESS RIGHT  05/11/2021   LOWER EXTREMITY ANGIOGRAPHY Left 12/25/2024   Procedure: Lower Extremity Angiography;  Surgeon: Marea Selinda RAMAN, MD;  Location: [facility] INVASIVE CV LAB;  Service: Cardiovascular;  Laterality: Left;   RADIOLOGY WITH ANESTHESIA N/A 05/11/2021   Procedure: IR WITH ANESTHESIA - INTRACRANIAL STENT;  Surgeon: de Macedo Rodrigues, Katyucia, MD;  Location: [facility] OR;  Service: Radiology;  Laterality: N/A;    FHx:   Family History  Problem Relation Age of Onset   Heart disease Mother    Heart disease Father     Social History:  reports that she has never smoked. She has never used smokeless tobacco. She reports that she does not drink alcohol and does not use drugs.  Allergies: Allergies[1]  Medications Prior to Admission  Medication Sig Dispense Refill   acetaminophen  (TYLENOL ) 325 MG tablet Take 2 tablets (650 mg total) by mouth every 6 (six) hours as needed for mild pain, fever or headache.     amiodarone  (PACERONE ) 200 MG tablet Take 1 tablet (200 mg total) by mouth 2 (two) times daily.     apixaban  (ELIQUIS ) 5 MG TABS tablet Take 1 tablet (5 mg total) by mouth 2 (two) times daily. 60 tablet 3   atorvastatin  (LIPITOR ) 40 MG tablet Take 1 tablet (40 mg total) by mouth daily.     divalproex  (DEPAKOTE ) 125 MG DR tablet Take 125 mg by mouth 2 (two) times daily.     famotidine  (PEPCID ) 20 MG tablet Take 20 mg by mouth at bedtime.     levothyroxine  (SYNTHROID ) 50 MCG tablet Take 50 mcg by mouth every other day.     levothyroxine  (SYNTHROID ) 75 MCG tablet Take 75 mcg by mouth every other day.     lisinopril  (ZESTRIL ) 20 MG tablet Take 1 tablet (20 mg total) by mouth daily. 30 tablet 0  melatonin 3 MG TABS tablet Take 2 tablets (6 mg total) by mouth at bedtime as needed.     metFORMIN  (GLUCOPHAGE ) 1000 MG tablet Take 1 tablet (1,000 mg total) by mouth 2 (two) times daily with a meal. 60 tablet 0   mirtazapine  (REMERON ) 7.5 MG tablet Take 7.5 mg by mouth at bedtime.     omeprazole (PRILOSEC) 40 MG capsule Take 40 mg by mouth daily.     ondansetron  (ZOFRAN ) 4 MG tablet Take 4 mg by mouth every 6 (six) hours as needed for nausea or vomiting.     polyethylene glycol (MIRALAX  / GLYCOLAX ) 17 g packet Take 17 g by mouth daily as needed for mild constipation.     cholecalciferol (VITAMIN D3) 25 MCG (1000 UNIT) tablet Take 1,000 Units by mouth daily. (Patient not taking: Reported on 12/22/2024)     ferrous  sulfate 325 (65 FE) MG tablet Take 325 mg by mouth every Monday, Wednesday, and Friday. (Patient not taking: Reported on 12/22/2024)     folic acid  (FOLVITE ) 1 MG tablet Take 1 tablet (1 mg total) by mouth daily. (Patient not taking: Reported on 12/22/2024)     vitamin B-12 (VITAMIN B12) 500 MCG tablet Take 1 tablet (500 mcg total) by mouth daily. (Patient not taking: Reported on 12/22/2024)      Physical Exam: General: Alert and oriented.  No apparent distress.  Left foot dressing intact, wound VAC suction appears to be well.  No strikethrough.  Dry stable eschars present at the ends of both big toes.  Results for orders placed or performed during the hospital encounter of 12/22/24 (from the past 48 hours)  Glucose, capillary     Status: Abnormal   Collection Time: 12/25/24  4:24 PM  Result Value Ref Range   Glucose-Capillary 216 (H) 70 - 99 mg/dL    Comment: Glucose reference range applies only to samples taken after fasting for at least 8 hours.  Glucose, capillary     Status: Abnormal   Collection Time: 12/25/24  7:55 PM  Result Value Ref Range   Glucose-Capillary 119 (H) 70 - 99 mg/dL    Comment: Glucose reference range applies only to samples taken after fasting for at least 8 hours.  Glucose, capillary     Status: Abnormal   Collection Time: 12/26/24 12:18 AM  Result Value Ref Range   Glucose-Capillary 137 (H) 70 - 99 mg/dL    Comment: Glucose reference range applies only to samples taken after fasting for at least 8 hours.  Glucose, capillary     Status: Abnormal   Collection Time: 12/26/24  4:03 AM  Result Value Ref Range   Glucose-Capillary 103 (H) 70 - 99 mg/dL    Comment: Glucose reference range applies only to samples taken after fasting for at least 8 hours.  Heparin  level (unfractionated)     Status: Abnormal   Collection Time: 12/26/24  8:02 AM  Result Value Ref Range   Heparin  Unfractionated 1.04 (H) 0.30 - 0.70 IU/mL    Comment: (NOTE) The clinical reportable  range upper limit is being lowered to >1.10 to align with the FDA approved guidance for the current laboratory assay.  If heparin  results are below expected values, and patient dosage has  been confirmed, suggest follow up testing of antithrombin III levels. Performed at [facility], [address] [place], [place] [address]   APTT     Status: Abnormal   Collection Time: 12/26/24  8:02 AM  Result Value Ref  Range   aPTT 74 (H) 24 - 36 seconds    Comment:        IF BASELINE aPTT IS ELEVATED, SUGGEST PATIENT RISK ASSESSMENT BE USED TO DETERMINE APPROPRIATE ANTICOAGULANT THERAPY. Performed at [facility], [address] [place], [place] [address]   CBC with Differential/Platelet     Status: Abnormal   Collection Time: 12/26/24  8:02 AM  Result Value Ref Range   WBC 12.3 (H) 4.0 - 10.5 K/uL   RBC 2.98 (L) 3.87 - 5.11 MIL/uL   Hemoglobin 8.1 (L) 12.0 - 15.0 g/dL   HCT 74.0 (L) 63.9 - 53.9 %   MCV 86.9 80.0 - 100.0 fL   MCH 27.2 26.0 - 34.0 pg   MCHC 31.3 30.0 - 36.0 g/dL   RDW 83.8 (H) 88.4 - 84.4 %   Platelets 253 150 - 400 K/uL   nRBC 0.0 0.0 - 0.2 %   Neutrophils Relative % 75 %   Neutro Abs 9.2 (H) 1.7 - 7.7 K/uL   Lymphocytes Relative 14 %   Lymphs Abs 1.7 0.7 - 4.0 K/uL   Monocytes Relative 10 %   Monocytes Absolute 1.2 (H) 0.1 - 1.0 K/uL   Eosinophils Relative 0 %   Eosinophils Absolute 0.1 0.0 - 0.5 K/uL   Basophils Relative 0 %   Basophils Absolute 0.0 0.0 - 0.1 K/uL   Immature Granulocytes 1 %   Abs Immature Granulocytes 0.08 (H) 0.00 - 0.07 K/uL    Comment: Performed at [facility], [address] [place], [place] [address]  Basic metabolic panel with GFR     Status: Abnormal   Collection Time: 12/26/24  8:02 AM  Result Value Ref Range   Sodium 143 135 - 145 mmol/L   Potassium 5.2 (H) 3.5 - 5.1 mmol/L   Chloride 109 98 - 111 mmol/L   CO2 22 22 - 32 mmol/L   Glucose, Bld 90 70 - 99 mg/dL    Comment: Glucose reference range  applies only to samples taken after fasting for at least 8 hours.   BUN 16 8 - 23 mg/dL   Creatinine, Ser 8.86 (H) 0.44 - 1.00 mg/dL   Calcium  7.1 (L) 8.9 - 10.3 mg/dL   GFR, Estimated 50 (L) >60 mL/min    Comment: (NOTE) Calculated using the CKD-EPI Creatinine Equation (2021)    Anion gap 12 5 - 15    Comment: Performed at [facility], [address] [place], [place] [address]  Glucose, capillary     Status: None   Collection Time: 12/26/24  8:32 AM  Result Value Ref Range   Glucose-Capillary 78 70 - 99 mg/dL    Comment: Glucose reference range applies only to samples taken after fasting for at least 8 hours.  Glucose, capillary     Status: None   Collection Time: 12/26/24 12:00 PM  Result Value Ref Range   Glucose-Capillary 90 70 - 99 mg/dL    Comment: Glucose reference range applies only to samples taken after fasting for at least 8 hours.  Glucose, capillary     Status: Abnormal   Collection Time: 12/26/24  4:54 PM  Result Value Ref Range   Glucose-Capillary 158 (H) 70 - 99 mg/dL    Comment: Glucose reference range applies only to samples taken after fasting for at least 8 hours.  Glucose, capillary     Status: None   Collection Time: 12/26/24  8:42 PM  Result Value Ref Range   Glucose-Capillary  86 70 - 99 mg/dL    Comment: Glucose reference range applies only to samples taken after fasting for at least 8 hours.  Glucose, capillary     Status: None   Collection Time: 12/27/24 12:10 AM  Result Value Ref Range   Glucose-Capillary 82 70 - 99 mg/dL    Comment: Glucose reference range applies only to samples taken after fasting for at least 8 hours.  Glucose, capillary     Status: None   Collection Time: 12/27/24  4:17 AM  Result Value Ref Range   Glucose-Capillary 84 70 - 99 mg/dL    Comment: Glucose reference range applies only to samples taken after fasting for at least 8 hours.  Glucose, capillary     Status: None   Collection Time: 12/27/24  8:00 AM  Result  Value Ref Range   Glucose-Capillary 74 70 - 99 mg/dL    Comment: Glucose reference range applies only to samples taken after fasting for at least 8 hours.  Glucose, capillary     Status: Abnormal   Collection Time: 12/27/24 12:02 PM  Result Value Ref Range   Glucose-Capillary 147 (H) 70 - 99 mg/dL    Comment: Glucose reference range applies only to samples taken after fasting for at least 8 hours.   US  ARTERIAL ABI (SCREENING LOWER EXTREMITY) Result Date: 12/26/2024 CLINICAL HISTORY: Peripheral artery disease. Hypertension, hyperlipidemia, diabetes, left lower extremity rest pain and claudication. EXAM: NONINVASIVE PHYSIOLOGIC VASCULAR STUDY OF BILATERAL LOWER EXTREMITIES TECHNIQUE: Evaluation of both lower extremities were performed at rest, including calculation of ankle-brachial indices with single level pressure measurements and doppler recording. COMPARISON: 12/22/2024. FINDINGS: Right ABI: 0.84 (previously 0.9) Left ABI: 0.87 (previously 0.81) Right Lower Extremity: Multiphasic arterial waveforms are recorded at the ankle. Left Lower Extremity: Multiphasic arterial waveforms are recorded at the ankle. IMPRESSION: 1. 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 2. Slightly decreased right ABI and slightly increased left ABI since previous exam. Electronically signed by: Katheleen Faes MD 12/26/2024 08:17 PM EST RP Workstation: [id number]    Blood pressure (!) 157/124, pulse 80, temperature 98 F (36.7 C), resp. rate 18, height 5' 1 (1.549 m), weight 54.4 kg, SpO2 100%.  Assessment Left posterior heel ulceration status postdebridement, heel bone exposed - possible osteomyelitis Bilateral hallux stable eschar  Plan - Patient seen and examined - Bone culture growing pseudmonas and staph aureus.  Path pending.  Concern for acute osteomyelitis of calcaneus.  Recommend infectious disease consultation tomorrow for discussion in regards to antibiotic management, could  consider IV antibiotic therapy.  Will contact Dr. Fayette tomorrow when she is in to let her know about the patient, order placed. - Wound VAC in place.  Recommend changes 3 times weekly.  Orders placed. - PT has been ordered.  If patient can weight-bear then may apply some pressure to the ball of the foot and left foot with a surgical shoe.  If she cannot and is bedbound and needs to still be wearing the Prevalon boot.  Remove surgical shoe today and applied pillow underneath the leg/heel to avoid pressure.  Should be doing this similarly on the right side. - Appreciate vascular recommendations.  Will follow peripherally.  If patient has her wound VAC changed by wound care he may come by and take a look at the wound to assess the area again sometime next week.  Prentice Lee, DPM 12/27/2024, 1:38 PM            [1]  Allergies Allergen  Reactions   Augmentin [Amoxicillin-Pot Clavulanate] Other (See Comments)    No reaction listed on MAR   Doxycycline  Other (See Comments)    No reaction listed on MAR   Lisinopril  Other (See Comments)    No reaction listed on MAR   Norvasc [Amlodipine] Other (See Comments)    No reaction listed on [facility]

## 2024-12-28 DIAGNOSIS — I739 Peripheral vascular disease, unspecified: Secondary | ICD-10-CM | POA: Diagnosis not present

## 2024-12-28 DIAGNOSIS — B965 Pseudomonas (aeruginosa) (mallei) (pseudomallei) as the cause of diseases classified elsewhere: Secondary | ICD-10-CM | POA: Diagnosis not present

## 2024-12-28 DIAGNOSIS — L97529 Non-pressure chronic ulcer of other part of left foot with unspecified severity: Secondary | ICD-10-CM

## 2024-12-28 DIAGNOSIS — B9561 Methicillin susceptible Staphylococcus aureus infection as the cause of diseases classified elsewhere: Secondary | ICD-10-CM

## 2024-12-28 LAB — GLUCOSE, CAPILLARY
Glucose-Capillary: 77 mg/dL (ref 70–99)
Glucose-Capillary: 68 mg/dL — ABNORMAL LOW (ref 70–99)
Glucose-Capillary: 77 mg/dL (ref 70–99)
Glucose-Capillary: 105 mg/dL — ABNORMAL HIGH (ref 70–99)
Glucose-Capillary: 133 mg/dL — ABNORMAL HIGH (ref 70–99)
Glucose-Capillary: 78 mg/dL (ref 70–99)

## 2024-12-28 LAB — SURGICAL PATHOLOGY

## 2024-12-28 NOTE — Plan of Care (Signed)
" °  Problem: Coping: Goal: Ability to adjust to condition or change in health will improve Outcome: Progressing   Problem: Fluid Volume: Goal: Ability to maintain a balanced intake and output will improve Outcome: Progressing   Problem: Metabolic: Goal: Ability to maintain appropriate glucose levels will improve Outcome: Progressing   Problem: Skin Integrity: Goal: Risk for impaired skin integrity will decrease Outcome: Progressing   Problem: Nutrition: Goal: Adequate nutrition will be maintained Outcome: Progressing   "

## 2024-12-28 NOTE — Progress Notes (Signed)
 Physical Therapy Treatment Patient Details Name: Madison Davidson MRN: [id number] DOB: 1951/08/28 Today's Date: 12/28/2024   History of Present Illness Pt admitted for L foot ulcer and is s/p wound debridement on 12/24/24 and POD 1 s/p angio and stent placement on 12/25/24. PMH of MCA CVA, HTN, CAD, and HTN.    PT Comments  Pt received in Semi-Fowler's position and agreeable to therapy.  Patient asleep upon entry but easily woken.  Therapist assisted in Doffing Prevalon boots before attempting mobilization.  Patient was able to assist in bringing lower extremities over to the left side of the bed in order to transfer into sitting position.  Patient put forth minimal effort in order to scoot to the edge of the bed and has significant weakness in bilateral upper extremities making it difficult for her to get to the edge of the bed.  Therapist assisted with heavy assistance in order to scoot patient to the edge of the bed using chuck pads.  Therapist called assistant to come and assist in attempting transfer.  Patient was unable to perform even with +2 maximal assistance and use of the rolling walker.  Daughter stated she would be able to assist and attempted to perform bearhug transfer over to the chair.  Daughter and patient successful and patient was given call bell and all needs were met upon leaving the room.  Nursing notified of patient being in chair and utilizing the lift if necessary to bring patient back to the bed.  Recommendations for skilled nursing facility still in place however understanding that patient and daughter do not want patient to be in rehab facility.   If plan is discharge home, recommend the following: A lot of help with walking and/or transfers;A lot of help with bathing/dressing/bathroom   Can travel by private vehicle        Equipment Recommendations  Hospital bed;Rolling walker (2 wheels)    Recommendations for Other Services       Precautions / Restrictions  Precautions Precautions: Fall Recall of Precautions/Restrictions: Intact Required Braces or Orthoses:  (post op shoe) Restrictions Weight Bearing Restrictions Per Provider Order: Yes LLE Weight Bearing Per Provider Order: Touchdown weight bearing LLE Partial Weight Bearing Percentage or Pounds: Toe touch for short distances per order Other Position/Activity Restrictions: with shoe donned     Mobility  Bed Mobility Overal bed mobility: Needs Assistance Bed Mobility: Supine to Sit     Supine to sit: Max assist     General bed mobility comments: Pt unable and unwilling to assist at times to get to the EOB.  Therapist had to perform maximal assist to get pt to the edge via chuck pad.    Transfers Overall transfer level: Needs assistance Equipment used: Rolling walker (2 wheels) Transfers: Sit to/from Stand Sit to Stand: Max assist, +2 physical assistance, +2 safety/equipment           General transfer comment: Pt unable to come upright with +2 assistance, and required bear-hug transfer from the daughter in order to transfer to recliner.    Ambulation/Gait               General Gait Details: deferred at this time as pt is unsafe to perform.   Stairs             Wheelchair Mobility     Tilt Bed    Modified Rankin (Stroke Patients Only)       Balance Overall balance assessment: Needs assistance Sitting-balance support: Feet unsupported, Bilateral  upper extremity supported Sitting balance-Leahy Scale: Fair     Standing balance support: Bilateral upper extremity supported, During functional activity, Reliant on assistive device for balance Standing balance-Leahy Scale: Poor Standing balance comment: Pt requires use of FWW to remaing in standing due to weight bearing restrictions                            Communication    Cognition                                        Cueing    Exercises      General Comments         Pertinent Vitals/Pain Pain Assessment Pain Assessment: Faces Faces Pain Scale: Hurts a little bit Pain Location: R groin Pain Descriptors / Indicators: Operative site guarding Pain Intervention(s): Limited activity within patient's tolerance    Home Living                          Prior Function            PT Goals (current goals can now be found in the care plan section) Acute Rehab PT Goals Patient Stated Goal: to go home no matter what PT Goal Formulation: With patient Time For Goal Achievement: 01/09/25 Potential to Achieve Goals: Good Progress towards PT goals: Not progressing toward goals - comment (pt is not putting forth good effort to attempt to stand or mobilize.)    Frequency    Min 2X/week      PT Plan      Co-evaluation              AM-PAC PT 6 Clicks Mobility   Outcome Measure  Help needed turning from your back to your side while in a flat bed without using bedrails?: A Little Help needed moving from lying on your back to sitting on the side of a flat bed without using bedrails?: A Lot Help needed moving to and from a bed to a chair (including a wheelchair)?: A Lot Help needed standing up from a chair using your arms (e.g., wheelchair or bedside chair)?: Total Help needed to walk in hospital room?: Total Help needed climbing 3-5 steps with a railing? : Total 6 Click Score: 10    End of Session Equipment Utilized During Treatment: Gait belt Activity Tolerance: Patient tolerated treatment well Patient left: in chair;with call bell/phone within reach;with family/visitor present Nurse Communication: Mobility status PT Visit Diagnosis: Muscle weakness (generalized) (M62.81);Difficulty in walking, not elsewhere classified (R26.2);Pain Pain - Right/Left: Right     Time: 8641-8562 PT Time Calculation (min) (ACUTE ONLY): 39 min  Charges:    $Therapeutic Activity: 38-52 mins PT General Charges $$ ACUTE PT VISIT: 1  Visit                     Fonda Simpers, PT, DPT Physical Therapist - [facility]  12/28/24, 3:47 PM

## 2024-12-28 NOTE — Plan of Care (Signed)

## 2024-12-28 NOTE — TOC CM/SW Note (Signed)
 Transition of Care (TOC) CM/SW Note    This patient requires the head of the bed to be elevated more than 30 degrees most of the time due to CVA (cerebral vascular accident) (HCC), Large vessel stroke (HCC) (Chronic)  Plus the need for frequent changes in the body position or the need for an immediate change of body position.  Patient is not able to walk the distance required to go the bathroom, or he/she is unable to safely negotiate stairs required to access the bathroom.  A 3in1 BSC will alleviate this problem

## 2024-12-29 ENCOUNTER — Encounter

## 2024-12-29 DIAGNOSIS — L97529 Non-pressure chronic ulcer of other part of left foot with unspecified severity: Secondary | ICD-10-CM | POA: Diagnosis not present

## 2024-12-29 DIAGNOSIS — I739 Peripheral vascular disease, unspecified: Secondary | ICD-10-CM | POA: Diagnosis not present

## 2024-12-29 DIAGNOSIS — B9561 Methicillin susceptible Staphylococcus aureus infection as the cause of diseases classified elsewhere: Secondary | ICD-10-CM | POA: Diagnosis not present

## 2024-12-29 DIAGNOSIS — B965 Pseudomonas (aeruginosa) (mallei) (pseudomallei) as the cause of diseases classified elsewhere: Secondary | ICD-10-CM | POA: Diagnosis not present

## 2024-12-29 LAB — CBC WITH DIFFERENTIAL/PLATELET
Abs Immature Granulocytes: 0.04 10*3/uL (ref 0.00–0.07)
Basophils Absolute: 0 10*3/uL (ref 0.0–0.1)
Basophils Relative: 0 %
Eosinophils Absolute: 0.3 10*3/uL (ref 0.0–0.5)
Eosinophils Relative: 3 %
HCT: 20.5 % — ABNORMAL LOW (ref 36.0–46.0)
Hemoglobin: 6.7 g/dL — ABNORMAL LOW (ref 12.0–15.0)
Immature Granulocytes: 0 %
Lymphocytes Relative: 16 %
Lymphs Abs: 1.4 10*3/uL (ref 0.7–4.0)
MCH: 27.9 pg (ref 26.0–34.0)
MCHC: 32.7 g/dL (ref 30.0–36.0)
MCV: 85.4 fL (ref 80.0–100.0)
Monocytes Absolute: 0.9 10*3/uL (ref 0.1–1.0)
Monocytes Relative: 10 %
Neutro Abs: 6.3 10*3/uL (ref 1.7–7.7)
Neutrophils Relative %: 71 %
Platelets: 273 10*3/uL (ref 150–400)
RBC: 2.4 MIL/uL — ABNORMAL LOW (ref 3.87–5.11)
RDW: 16.6 % — ABNORMAL HIGH (ref 11.5–15.5)
WBC: 9 10*3/uL (ref 4.0–10.5)
nRBC: 0 % (ref 0.0–0.2)

## 2024-12-29 LAB — BASIC METABOLIC PANEL WITH GFR
Anion gap: 13 (ref 5–15)
BUN: 12 mg/dL (ref 8–23)
CO2: 22 mmol/L (ref 22–32)
Calcium: 7.9 mg/dL — ABNORMAL LOW (ref 8.9–10.3)
Chloride: 107 mmol/L (ref 98–111)
Creatinine, Ser: 1.09 mg/dL — ABNORMAL HIGH (ref 0.44–1.00)
GFR, Estimated: 53 mL/min — ABNORMAL LOW
Glucose, Bld: 98 mg/dL (ref 70–99)
Potassium: 3.8 mmol/L (ref 3.5–5.1)
Sodium: 141 mmol/L (ref 135–145)

## 2024-12-29 LAB — PREPARE RBC (CROSSMATCH)

## 2024-12-29 LAB — GLUCOSE, CAPILLARY
Glucose-Capillary: 78 mg/dL (ref 70–99)
Glucose-Capillary: 69 mg/dL — ABNORMAL LOW (ref 70–99)
Glucose-Capillary: 91 mg/dL (ref 70–99)
Glucose-Capillary: 97 mg/dL (ref 70–99)
Glucose-Capillary: 128 mg/dL — ABNORMAL HIGH (ref 70–99)
Glucose-Capillary: 117 mg/dL — ABNORMAL HIGH (ref 70–99)
Glucose-Capillary: 148 mg/dL — ABNORMAL HIGH (ref 70–99)

## 2024-12-29 LAB — ABO/RH: ABO/RH(D): B POS

## 2024-12-29 MED ORDER — SODIUM CHLORIDE 0.9% IV SOLUTION: Freq: Once | INTRAVENOUS | Status: DC

## 2024-12-29 NOTE — Progress Notes (Signed)
 " Progress Note   Patient: Madison Davidson FMW:[id number] DOB: [birth year] DOA: 12/22/2024     5 DOS: the patient was seen and examined on 12/29/2024      Brief hospital course: From HPI Madison Davidson is a 74 y.o. female with medical history significant of right MCA CVA status post stenting, PAF on Eliquis , HTN, CAD, HTN, hypothyroidism, presented with worsening of left heel wound and dehydration.   History was provided by daughter at bedside.  Patient has had intermittent left leg pain since sometime earlier last year, gradually her ambulation significantly affected and she has become wheelchair-bound for 6+ months.  2 months ago she developed left heel wound, began with a bloody blister, at some point it ruptured and patient started develop a ulcer with callus on top of it.  Daughter went to see patient on the weekend and noticed the patient developed severe pain and swelling of left heel which was new.  Patient was evaluated by vascular surgeon who ordered outpatient ABI study scheduled for next week.  In addition, daughter also noticed patient p.o. intake has significant decreased over the weekend and appeared to be very dehydrated today which prompted her to send patient to ED.   ED Course: Afebrile, nontachycardic blood pressure 128/51.  X-ray showed no signs of osteomyelitis of left foot, blood work showed WBC 11.0 hemoglobin 9.3 BUN 32 creatinine 1.5 compared to baseline less than 1.0, K5.3.   Patient was given IV bolus x 1 and started on vancomycin  and Zosyn  in the ED.      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 Fayette  ID is waiting on final pathology results to guide antibiotic therapy Pathology report still pending Continue Zosyn  and doxycycline  Continue as needed pain medication   Acute blood loss anemia from the surgical site Noted to have had downtrending of hemoglobin up to 6.7 today.  Overnight physician ordered for some blood transfusion. Wound VAC showed some bloody fluid likely the source of bleeding Patient have orders for blood transfusion however awaiting IV team due to poor IV access We will monitor CBC closely Eliquis  on hold  AKI-improved Hyperkalemia-improved -Likely prerenal secondary to dehydration, Monitor renal function closely   PAF Holding Eliquis  at this time on account of acute blood loss anemia Currently rate controlled   CVA Continue aspirin  and statin therapy   Hypothyroidism - Continue Synthroid    IIDM Holding metformin  at this time Continue insulin  therapy   DVT prophylaxis: SCD  Code Status: DNR/DNI   Disposition Plan: PT OT recommended SNF however patient is looking at going home with home health when medically stable   Consults called: Podiatry  Family communication: I discussed the case today with the patient's daughter Madison Davidson 12/29/2024    Subjective:  Seen and examined at bedside this morning Noted to have had downtrending of hemoglobin up to 6.7 today.  Overnight physician ordered for some blood transfusion. Wound VAC showed some bloody fluid likely the source of bleeding Denies nausea vomiting melena abdominal pain or cough   Physical Exam: General: Seen  and examined in no acute distress Respiratory: clear to auscultation bilaterally, no wheezing Abdomen: no tenderness, no masses palpated. No hepatosplenomegaly. Bowel sounds positive.  Musculoskeletal: Dressing noted to the foot with clean  dressing, wound VAC in place Neurologic: CN 2-12 grossly intact. Sensation intact, DTR normal. Strength 5/5 in all 4.  Psychiatric: Normal judgment and insight. Alert and oriented x 3. Normal mood.     Data Reviewed:  Vitals:   12/28/24 1712 12/28/24 2036 12/29/24 0514 12/29/24 0750  BP: (!) 137/56 (!) 153/67 (!) 164/56 (!) 162/56  Pulse: 60 61 (!) 58 (!) 58  Resp: 18 16 16 16   Temp: 98.1 F (36.7 C) (!) 97.5 F (36.4 C) 98.2 F (36.8 C) 98.4 F (36.9 C)  TempSrc:  Oral    SpO2: 98% 99% 96% 100%  Weight:      Height:          Latest Ref Rng & Units 12/29/2024    4:32 AM 12/26/2024    8:02 AM 12/25/2024    5:41 AM  CBC  WBC 4.0 - 10.5 K/uL 9.0  12.3  8.5   Hemoglobin 12.0 - 15.0 g/dL 6.7  8.1  7.2   Hematocrit 36.0 - 46.0 % 20.5  25.9  23.0   Platelets 150 - 400 K/uL 273  253  211        Latest Ref Rng & Units 12/29/2024    8:45 AM 12/26/2024    8:02 AM 12/25/2024    5:41 AM  BMP  Glucose 70 - 99 mg/dL 98  90  747   BUN 8 - 23 mg/dL 12  16  19    Creatinine 0.44 - 1.00 mg/dL 8.90  8.86  8.82   Sodium 135 - 145 mmol/L 141  143  137   Potassium 3.5 - 5.1 mmol/L 3.8  5.2  4.8   Chloride 98 - 111 mmol/L 107  109  105   CO2 22 - 32 mmol/L 22  22  19    Calcium  8.9 - 10.3 mg/dL 7.9  7.1  6.6      Author: Drue ONEIDA Potter, MD 12/29/2024 2:17 PM  For on call review [email or web address].  "

## 2024-12-29 NOTE — Inpatient Diabetes Management (Signed)
 Inpatient Diabetes Program Recommendations  AACE/ADA: New Consensus Statement on Inpatient Glycemic Control  Target Ranges:  Prepandial:   less than 140 mg/dL      Peak postprandial:   less than 180 mg/dL (1-2 hours)      Critically ill patients:  140 - 180 mg/dL    Latest Reference Range & Units 12/28/24 03:06 12/28/24 07:57 12/28/24 08:26 12/28/24 11:44 12/28/24 17:11 12/28/24 20:38 12/29/24 01:06 12/29/24 05:45 12/29/24 06:13  Glucose-Capillary 70 - 99 mg/dL 77 68 (L) 77 894 (H) 866 (H)  Novolog  1 unit 78 78 69 (L) 91   Review of Glycemic Control  Diabetes history: DM2 Outpatient Diabetes medications: Metformin  1000 mg BID Current orders for Inpatient glycemic control: Novolog  0-9 units TID with meals   Inpatient Diabetes Program Recommendations:    Insulin : Please decrease Novolog  correction to 0-6 units TID with meals.  Thanks, Earnie Gainer, RN, MSN, CDCES Diabetes Coordinator Inpatient Diabetes Program [phone] (Team Pager from 8am to 5pm)

## 2024-12-29 NOTE — Plan of Care (Signed)

## 2024-12-29 NOTE — Consult Note (Addendum)
" ° ° °  CLINICAL SUPPORT TEAM - WOUND OSTOMY AND CONTINENCE TEAM  CONSULTATION SERVICES   WOC Nurse-Inpatient Note    WOC Nurse wound follow up Wound type: DTPI to Sacrum Measurement: 1.2 cm x 1.0 cm x 0.1 cm Wound bed: 100% superficial yellow slough. Drainage (amount, consistency, odor) Scant amount, serous, no odor. Periwound: purple edge at 9 o'clock; intact, redness blanchable. Dressing procedure/placement/frequency: Cleanse with saline, pat dry. Apply a single layer of Xeroform to the wound bed daily. To with sacrum foam dressing, the foam can remain 3 days if not saturated or soiled. Ok to lift, change the Xeroform and reapply.  - Turn and repositioning per hospital policy - If the pt is able to go in the chair, add a chair pressure retribution pad.      WOC Nurse wound follow up Wound type: Surgical pos debridement, graft, wound on L heel. Measurement: 5 cm x 3.5 cm x 0.1 cm. Wound bed: 80% red, 20% yellow slough. Drainage (amount, consistency, odor) Small amount, serosanguinous. 50 ml in canister at 1100. Periwound: macerated. Dressing procedure/placement/frequency: Removed old NPWT dressing Cleansed wound with normal saline Periwound skin protected with skin barrier wipe and window framed with drape Skin graft with Xeroform on top. Xeroform felt down when dressing was gently removed. Reapplied Xeroform, filled wound with 1 piece of black foam  Sealed NPWT dressing at HG Patient tolerated procedure well  WOC nurse will continue to provide NPWT dressing changed due to the complexity of the dressing change.       Addendum: Refuses to use Prevalon boot.  WOC team will follow FRI 1000. Please reconsult if further assistance is needed. Thank-you,  Lela Holm MSN, RN, CWCN, CNS.  (Phone [phone])    "

## 2024-12-30 LAB — BPAM RBC
Blood Product Expiration Date: 202603042359
ISSUE DATE / TIME: 202602031442
Unit Type and Rh: 7300

## 2024-12-30 LAB — CBC WITH DIFFERENTIAL/PLATELET
Abs Immature Granulocytes: 0.1 10*3/uL — ABNORMAL HIGH (ref 0.00–0.07)
Basophils Absolute: 0.1 10*3/uL (ref 0.0–0.1)
Basophils Relative: 0 %
Eosinophils Absolute: 0.3 10*3/uL (ref 0.0–0.5)
Eosinophils Relative: 3 %
HCT: 34 % — ABNORMAL LOW (ref 36.0–46.0)
Hemoglobin: 11.2 g/dL — ABNORMAL LOW (ref 12.0–15.0)
Immature Granulocytes: 1 %
Lymphocytes Relative: 11 %
Lymphs Abs: 1.3 10*3/uL (ref 0.7–4.0)
MCH: 27.5 pg (ref 26.0–34.0)
MCHC: 32.9 g/dL (ref 30.0–36.0)
MCV: 83.3 fL (ref 80.0–100.0)
Monocytes Absolute: 1.3 10*3/uL — ABNORMAL HIGH (ref 0.1–1.0)
Monocytes Relative: 11 %
Neutro Abs: 9.1 10*3/uL — ABNORMAL HIGH (ref 1.7–7.7)
Neutrophils Relative %: 74 %
Platelets: 276 10*3/uL (ref 150–400)
RBC: 4.08 MIL/uL (ref 3.87–5.11)
RDW: 15.9 % — ABNORMAL HIGH (ref 11.5–15.5)
WBC: 12.2 10*3/uL — ABNORMAL HIGH (ref 4.0–10.5)
nRBC: 0 % (ref 0.0–0.2)

## 2024-12-30 LAB — TYPE AND SCREEN
ABO/RH(D): B POS
Antibody Screen: NEGATIVE
Unit division: 0

## 2024-12-30 LAB — GLUCOSE, CAPILLARY
Glucose-Capillary: 101 mg/dL — ABNORMAL HIGH (ref 70–99)
Glucose-Capillary: 109 mg/dL — ABNORMAL HIGH (ref 70–99)
Glucose-Capillary: 124 mg/dL — ABNORMAL HIGH (ref 70–99)
Glucose-Capillary: 130 mg/dL — ABNORMAL HIGH (ref 70–99)
Glucose-Capillary: 153 mg/dL — ABNORMAL HIGH (ref 70–99)
Glucose-Capillary: 90 mg/dL (ref 70–99)
Glucose-Capillary: 97 mg/dL (ref 70–99)

## 2024-12-30 LAB — AEROBIC/ANAEROBIC CULTURE W GRAM STAIN (SURGICAL/DEEP WOUND)

## 2024-12-30 LAB — BASIC METABOLIC PANEL WITH GFR
Anion gap: 15 (ref 5–15)
BUN: 11 mg/dL (ref 8–23)
CO2: 22 mmol/L (ref 22–32)
Calcium: 8.4 mg/dL — ABNORMAL LOW (ref 8.9–10.3)
Chloride: 105 mmol/L (ref 98–111)
Creatinine, Ser: 1.03 mg/dL — ABNORMAL HIGH (ref 0.44–1.00)
GFR, Estimated: 56 mL/min — ABNORMAL LOW
Glucose, Bld: 82 mg/dL (ref 70–99)
Potassium: 3.6 mmol/L (ref 3.5–5.1)
Sodium: 142 mmol/L (ref 135–145)

## 2024-12-30 LAB — MAGNESIUM: Magnesium: 1 mg/dL — ABNORMAL LOW (ref 1.7–2.4)

## 2024-12-30 MED ORDER — MAGNESIUM SULFATE 4 GM/100ML IV SOLN
4.0000 g | Freq: Once | INTRAVENOUS | Status: AC
  Administered 2024-12-30: 4 g via INTRAVENOUS
  Filled 2024-12-30: qty 100

## 2024-12-30 MED ORDER — SODIUM CHLORIDE 0.9 % IV SOLN
2.0000 g | Freq: Two times a day (BID) | INTRAVENOUS | Status: DC
  Administered 2024-12-30 – 2024-12-31 (×2): 2 g via INTRAVENOUS
  Filled 2024-12-30 (×2): qty 12.5

## 2024-12-30 MED ORDER — LISINOPRIL 20 MG PO TABS
20.0000 mg | ORAL_TABLET | Freq: Every day | ORAL | Status: DC
  Administered 2024-12-30 – 2024-12-31 (×2): 20 mg via ORAL
  Filled 2024-12-30 (×2): qty 1

## 2024-12-30 NOTE — Progress Notes (Signed)
 "  Date of Admission:  12/22/2024      ID: Madison Davidson is a 73 y.o. 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 with a history of PAF,CAD, HTN, CVA RT MCA Stenting 2022 hypothyroidism,Presenting on 12/22/24 with pain legs and a chronic wound left heel. As per the chart patient has had a left heel ulcer since Nov  2025 but the past few days she has had pain both legs left > rt   Subjective: Pt doing much better Pain much better  Medications:   sodium chloride    Intravenous Once   amiodarone   200 mg Oral BID   atorvastatin   40 mg Oral QHS   bisacodyl   5 mg Oral Daily   divalproex   125 mg Oral BID   famotidine   20 mg Oral QHS   Gerhardt's butt cream   Topical BID   insulin  aspart  0-9 Units Subcutaneous TID WC   levothyroxine   50 mcg Oral QODAY   levothyroxine   75 mcg Oral QODAY   mirtazapine   7.5 mg Oral QHS   pantoprazole   40 mg Oral Daily   polyethylene glycol  17 g Oral Daily    Objective: Vital signs in last 24 hours: Patient Vitals for the past 24 hrs:  BP Temp Temp src Pulse Resp SpO2  12/30/24 0826 (!) 151/53 97.8 F (36.6 C) -- (!) 59 16 98 %  12/30/24 0400 (!) 160/70 -- -- -- -- --  12/30/24 0224 (!) 192/92 98.3 F (36.8 C) -- 65 18 98 %  12/29/24 2130 (!) 188/67 98.4 F (36.9 C) -- 60 18 96 %  12/29/24 1725 (!) 154/63 98.1 F (36.7 C) Oral 72 16 --  12/29/24 1500 (!) 175/56 98.2 F (36.8 C) Oral 67 17 100 %  12/29/24 1443 (!) 165/53 98.5 F (36.9 C) Oral 67 15 99 %     PHYSICAL EXAM:  General: Alert, cooperative, no distress, appears stated age.  Lungs: b/l air entry Heart: s1s2 Abdomen: Soft, non-tender,not distended. Bowel sounds normal. No masses Extremities: left heel ulcer covered with wound vac Rt foot great toe superficial  wound Skin: No rashes or lesions. Or bruising Lymph: Cervical, supraclavicular normal. Neurologic: some weakness rt side  Lab Results    Latest Ref Rng & Units 12/30/2024    5:01 AM 12/29/2024    4:32 AM 12/26/2024    8:02 AM  CBC  WBC 4.0 - 10.5 K/uL 12.2  9.0  12.3   Hemoglobin 12.0 - 15.0 g/dL 88.7  6.7  8.1   Hematocrit 36.0 - 46.0 % 34.0  20.5  25.9   Platelets 150 - 400 K/uL 276  273  253        Latest Ref Rng & Units 12/30/2024    5:01 AM 12/29/2024    8:45 AM 12/26/2024    8:02 AM  CMP  Glucose 70 - 99 mg/dL 82  98  90   BUN 8 - 23 mg/dL 11  12  16    Creatinine 0.44 - 1.00 mg/dL 8.96  8.90  8.86   Sodium 135 - 145 mmol/L 142  141  143   Potassium 3.5 - 5.1 mmol/L 3.6  3.8  5.2   Chloride 98 - 111  mmol/L 105  107  109   CO2 22 - 32 mmol/L 22  22  22    Calcium  8.9 - 10.3 mg/dL 8.4  7.9  7.1       Microbiology: WC- Pseudomonas/MSSA    Assessment/Plan: Left heel ulcer chronic- likely pressure ulcer from being wheel chair/bed bound No obvious Osteo on Xray and MRI some endosteal edema questioning early osteo. Underwent Debridement to the bone- podiatrist said bone was not soft Pathology No osteo Culture pseudomonas and MSSA Pt currently on zosyn  and doxy?- change to cefepime  We will do Cipro  + Doxy   for 2 weeks total ( need for another 10 days)  EKG  Qtc-was prolonged yesterday- today normal and laos mg was low and was replaced  as patient on amiodarone  will monitor Qtc while on cipro  will check EKG, Mg, K as OP Discussed with hospitalist   PAD s/p angioplasty and stent placement of left SFA, Tibioperoneal trunk   CVA h/o stenting of left MCA   CAD   HTN   PAF on amiodarone    Hypothyroidism on synthroid  ? AKI improved   Anemia    Discussed the management with patient , hospitalist and ID pharmacist  "

## 2024-12-30 NOTE — Plan of Care (Signed)
" °  Problem: Nutritional: Goal: Maintenance of adequate nutrition will improve Outcome: Progressing   Problem: Skin Integrity: Goal: Risk for impaired skin integrity will decrease Outcome: Progressing   Problem: Tissue Perfusion: Goal: Adequacy of tissue perfusion will improve Outcome: Progressing   Problem: Tissue Perfusion: Goal: Adequacy of tissue perfusion will improve Outcome: Progressing   Problem: Activity: Goal: Risk for activity intolerance will decrease Outcome: Progressing   Problem: Activity: Goal: Risk for activity intolerance will decrease Outcome: Progressing   Problem: Elimination: Goal: Will not experience complications related to bowel motility Outcome: Progressing   "

## 2024-12-30 NOTE — TOC Progression Note (Addendum)
 Transition of Care [facility]) - Progression Note    Patient Details  Name: Madison Davidson MRN: [id number] Date of Birth: May 26, 1951  [facility] of [facility]) CM/SW Contact  Daved JONETTA Hamilton, RN Phone Number: 12/30/2024, 3:29 PM  Clinical Narrative:     This CM confirmed with Mitch at [employer] that hospital bed and lift were delivered to patient's home 12/29/24 at approximately 5pm.  Patient will discharge home with [facility], Spoke with patient's daughter Samule, she advised she will transport patient home once she is medically ready for discharge. Samule inquires about the wound vac the patient has and if the patient will be discharged with it, if so, who/how is it managed, will she be responsible for wound care, etc, discussed with Samule I will forward her questions to Attending and a plan will be in place for wound vac/wound care before patient discharges, [name] verbalized understanding and agreement to this plan.  [name] sent to Attending with [name]'s questions/concerns.    UPDATE 5:30pm-  HH orders placed for Wound Vac. This CM contacted Marylen at [facility] [phone] for wound vac. He is sending order via fax for Attending to sign, wound vac will be delivered to patient room tomorrow.                       Expected Discharge Plan and Services                                               Social Drivers of Health (SDOH) Interventions SDOH Screenings   Food Insecurity: No Food Insecurity (12/23/2024)  Housing: Low Risk (12/23/2024)  Transportation Needs: No Transportation Needs (12/23/2024)  Utilities: Not At Risk (12/23/2024)  Social Connections: Unknown (12/23/2024)  Tobacco Use: Low Risk (12/24/2024)    Readmission Risk Interventions    10/21/2024   10:37 AM  Readmission Risk Prevention Plan  Transportation Screening Complete  PCP or Specialist Appt within 3-5 Days Complete  HRI or Home Care Consult Complete  Social Work Consult for Recovery Care  Planning/Counseling Complete  Palliative Care Screening Not Applicable  Medication Review Oceanographer) Complete

## 2024-12-30 NOTE — Progress Notes (Signed)
 " PROGRESS NOTE    Madison Davidson  FMW:[id number] DOB: 02/13/1951 DOA: 12/22/2024 PCP: [facility]    Brief Narrative:   From HPI Madison Davidson is a 74 y.o. female with medical history significant of right MCA CVA status post stenting, PAF on Eliquis , HTN, CAD, HTN, hypothyroidism, presented with worsening of left heel wound and dehydration.   History was provided by daughter at bedside.  Patient has had intermittent left leg pain since sometime earlier last year, gradually her ambulation significantly affected and she has become wheelchair-bound for 6+ months.  2 months ago she developed left heel wound, began with a bloody blister, at some point it ruptured and patient started develop a ulcer with callus on top of it.  Daughter went to see patient on the weekend and noticed the patient developed severe pain and swelling of left heel which was new.  Patient was evaluated by vascular surgeon who ordered outpatient ABI study scheduled for next week.  In addition, daughter also noticed patient p.o. intake has significant decreased over the weekend and appeared to be very dehydrated today which prompted her to send patient to ED.   ED Course: Afebrile, nontachycardic blood pressure 128/51.  X-ray showed no signs of osteomyelitis of left foot, blood work showed WBC 11.0 hemoglobin 9.3 BUN 32 creatinine 1.5 compared to baseline less than 1.0, K5.3.   Patient was given IV bolus x 1 and started on vancomycin  and Zosyn  in the ED.    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 Fayette  Plan: For now continue IV antibiotics.  Can likely do oral option to avoid need for long-term intravenous antimicrobial therapy.  Will need to confirm stability of QTc prior to initiation of ciprofloxacin .  Optimize electrolytes.  Acute blood loss anemia from the surgical site Noted to have had downtrending of hemoglobin up to 6.7 on 2/3.  Interestingly received 1 unit PRBC with increase in hemoglobin up to 11.2. Plan: Recheck a.m. CBC Continue holding Eliquis    AKI-improved Hyperkalemia-improved -Likely prerenal secondary to dehydration, Monitor renal function closely   PAF Holding Eliquis  at this time on account of acute blood loss anemia Currently rate controlled   CVA Continue aspirin  and statin therapy   Hypothyroidism - Continue Synthroid    IIDM Holding metformin  at this time Continue insulin  therapy   DVT prophylaxis: SCD Code Status: DNR Family Communication: None today Disposition Plan: Status is: Inpatient Remains inpatient appropriate because: Medical optimization.  Anticipate discharge 2/5   Level of care: Med-Surg  Consultants:  None  Procedures:  Lower extremity angiography Wound debridement  Antimicrobials: Cefepime    Subjective: Seen and examined.  Lying in bed.  No distress.  No complaints  Objective: Vitals:   12/30/24 0224 12/30/24 0400 12/30/24 0826 12/30/24 1441  BP: (!) 192/92 ROLLEN)  160/70 (!) 151/53 (!) 136/54  Pulse: 65  (!) 59 62  Resp: 18  16 16   Temp: 98.3 F (36.8 C)  97.8 F (36.6 C) 97.7 F (36.5 C)  TempSrc:    Axillary  SpO2: 98%  98% 98%  Weight:      Height:        Intake/Output  Summary (Last 24 hours) at 12/30/2024 1518 Last data filed at 12/30/2024 1300 Gross per 24 hour  Intake 885 ml  Output 1470 ml  Net -585 ml   Filed Weights   12/22/24 1030 12/24/24 1545  Weight: 54.4 kg 54.4 kg    Examination:  General exam: Appears calm and comfortable  Respiratory system: Clear to auscultation. Respiratory effort normal. Cardiovascular system: S1-S2, RRR, no murmurs, no pedal edema Gastrointestinal system: Soft, NT/ND, normal bowel sounds Central nervous system: Alert and oriented. No focal neurological deficits. Extremities: Lower extremity wound VAC Skin: No rashes, lesions or ulcers Psychiatry: Judgement and insight appear normal. Mood & affect appropriate.     Data Reviewed: I have personally reviewed following labs and imaging studies  CBC: Recent Labs  Lab 12/24/24 0236 12/25/24 0541 12/26/24 0802 12/29/24 0432 12/30/24 0501  WBC 8.7 8.5 12.3* 9.0 12.2*  NEUTROABS  --  7.9* 9.2* 6.3 9.1*  HGB 7.3* 7.2* 8.1* 6.7* 11.2*  HCT 23.7* 23.0* 25.9* 20.5* 34.0*  MCV 87.5 88.1 86.9 85.4 83.3  PLT 223 211 253 273 276   Basic Metabolic Panel: Recent Labs  Lab 12/24/24 0236 12/25/24 0541 12/26/24 0802 12/29/24 0845 12/30/24 0501  NA 142 137 143 141 142  K 5.0 4.8 5.2* 3.8 3.6  CL 110 105 109 107 105  CO2 21* 19* 22 22 22   GLUCOSE 113* 252* 90 98 82  BUN 20 19 16 12 11   CREATININE 1.12* 1.17* 1.13* 1.09* 1.03*  CALCIUM  6.6* 6.6* 7.1* 7.9* 8.4*  MG  --   --   --   --  1.0*   GFR: Estimated Creatinine Clearance: 35.6 mL/min (A) (by C-G formula based on SCr of 1.03 mg/dL (H)). Liver Function Tests: No results for input(s): AST, ALT, ALKPHOS, BILITOT, PROT, ALBUMIN in the last 168 hours. No results for input(s): LIPASE, AMYLASE in the last 168 hours. No results for input(s): AMMONIA in the last 168 hours. Coagulation Profile: No results for input(s): INR, PROTIME in the last 168 hours. Cardiac Enzymes: No results for  input(s): CKTOTAL, CKMB, CKMBINDEX, TROPONINI in the last 168 hours. BNP (last 3 results) No results for input(s): PROBNP in the last 8760 hours. HbA1C: No results for input(s): HGBA1C in the last 72 hours. CBG: Recent Labs  Lab 12/29/24 2132 12/30/24 0038 12/30/24 0431 12/30/24 0740 12/30/24 1149  GLUCAP 148* 101* 97 90 153*   Lipid Profile: No results for input(s): CHOL, HDL, LDLCALC, TRIG, CHOLHDL, LDLDIRECT in the last 72 hours. Thyroid Function Tests: No results for input(s): TSH, T4TOTAL, FREET4, T3FREE, THYROIDAB in the last 72 hours. Anemia Panel: No results for input(s): VITAMINB12, FOLATE, FERRITIN, TIBC, IRON, RETICCTPCT in the last 72 hours. Sepsis Labs: No results for input(s): PROCALCITON, LATICACIDVEN in the last 168 hours.  Recent Results (from the past 240 hours)  MRSA Next Gen by PCR, Nasal     Status: None   Collection Time: 12/22/24  2:02 PM   Specimen: Nasal Mucosa; Nasal Swab  Result Value Ref Range Status   MRSA by PCR Next Gen NOT DETECTED NOT DETECTED Final    Comment: (NOTE) The GeneXpert  MRSA Assay (FDA approved for NASAL specimens only), is one component of a comprehensive MRSA colonization surveillance program. It is not intended to diagnose MRSA infection nor to guide or monitor treatment for MRSA infections. Test performance is not FDA approved in patients less than 43 years old. Performed at [facility], [address] [place], [place] [address]   CULTURE, BLOOD (ROUTINE X 2) w Reflex to ID Panel     Status: None   Collection Time: 12/22/24  6:27 PM   Specimen: BLOOD  Result Value Ref Range Status   Specimen Description BLOOD BLOOD LEFT ARM  Final   Special Requests   Final    BOTTLES DRAWN AEROBIC AND ANAEROBIC Blood Culture adequate volume   Culture   Final    NO GROWTH 5 DAYS Performed at [facility], [address] [place], [place] [address]    Report Status  12/27/2024 FINAL  Final  CULTURE, BLOOD (ROUTINE X 2) w Reflex to ID Panel     Status: None   Collection Time: 12/22/24  8:44 PM   Specimen: BLOOD  Result Value Ref Range Status   Specimen Description BLOOD BLOOD LEFT HAND  Final   Special Requests   Final    BOTTLES DRAWN AEROBIC AND ANAEROBIC Blood Culture adequate volume   Culture   Final    NO GROWTH 5 DAYS Performed at [facility], [address] [place], [place] [address]    Report Status 12/27/2024 FINAL  Final  Aerobic/Anaerobic Culture w Gram Stain (surgical/deep wound)     Status: None   Collection Time: 12/24/24  4:48 PM   Specimen: PATH Bone biopsy; Tissue  Result Value Ref Range Status   Specimen Description   Final    BONE Performed at [facility], [address] [place], [place] [address]    Special Requests   Final    NONE Performed at [facility], [address] [place], [place] [address]    Gram Stain   Final    RARE WBC PRESENT, PREDOMINANTLY PMN NO ORGANISMS SEEN    Culture   Final    RARE STAPHYLOCOCCUS AUREUS RARE PSEUDOMONAS AERUGINOSA CRITICAL RESULT CALLED TO, READ BACK BY AND VERIFIED WITH: RN KATRINA P. [id number] AT 1602, ADC NO ANAEROBES ISOLATED Performed at [facility], [address], [place], [place] [address]    Report Status 12/30/2024 FINAL  Final   Organism ID, Bacteria STAPHYLOCOCCUS AUREUS  Final   Organism ID, Bacteria PSEUDOMONAS AERUGINOSA  Final      Susceptibility   Pseudomonas aeruginosa - MIC*    MEROPENEM 0.5 SENSITIVE Sensitive     CIPROFLOXACIN  0.12 SENSITIVE Sensitive     IMIPENEM 2 SENSITIVE Sensitive     PIP/TAZO Value in next row Sensitive      16 SENSITIVEThis is a modified FDA-approved test that has been validated and its performance characteristics determined by the reporting laboratory.  This laboratory is certified under the Clinical Laboratory Improvement Amendments CLIA as qualified to perform high complexity clinical  laboratory testing.    CEFEPIME  Value in next row Sensitive      16 SENSITIVEThis is a modified FDA-approved test that has been validated and its performance characteristics determined by the reporting laboratory.  This laboratory is certified under the Clinical Laboratory Improvement Amendments CLIA as qualified to perform high complexity clinical laboratory testing.    CEFTAZIDIME/AVIBACTAM Value in next row Sensitive      16 SENSITIVEThis is a  modified FDA-approved test that has been validated and its performance characteristics determined by the reporting laboratory.  This laboratory is certified under the Clinical Laboratory Improvement Amendments CLIA as qualified to perform high complexity clinical laboratory testing.    CEFTOLOZANE/TAZOBACTAM Value in next row Sensitive      16 SENSITIVEThis is a modified FDA-approved test that has been validated and its performance characteristics determined by the reporting laboratory.  This laboratory is certified under the Clinical Laboratory Improvement Amendments CLIA as qualified to perform high complexity clinical laboratory testing.    TOBRAMYCIN Value in next row Sensitive      16 SENSITIVEThis is a modified FDA-approved test that has been validated and its performance characteristics determined by the reporting laboratory.  This laboratory is certified under the Clinical Laboratory Improvement Amendments CLIA as qualified to perform high complexity clinical laboratory testing.    CEFTAZIDIME Value in next row Sensitive      16 SENSITIVEThis is a modified FDA-approved test that has been validated and its performance characteristics determined by the reporting laboratory.  This laboratory is certified under the Clinical Laboratory Improvement Amendments CLIA as qualified to perform high complexity clinical laboratory testing.    * RARE PSEUDOMONAS AERUGINOSA   Staphylococcus aureus - MIC*    CIPROFLOXACIN  Value in next row Resistant      16  SENSITIVEThis is a modified FDA-approved test that has been validated and its performance characteristics determined by the reporting laboratory.  This laboratory is certified under the Clinical Laboratory Improvement Amendments CLIA as qualified to perform high complexity clinical laboratory testing.    ERYTHROMYCIN Value in next row Sensitive      16 SENSITIVEThis is a modified FDA-approved test that has been validated and its performance characteristics determined by the reporting laboratory.  This laboratory is certified under the Clinical Laboratory Improvement Amendments CLIA as qualified to perform high complexity clinical laboratory testing.    GENTAMICIN Value in next row Sensitive      16 SENSITIVEThis is a modified FDA-approved test that has been validated and its performance characteristics determined by the reporting laboratory.  This laboratory is certified under the Clinical Laboratory Improvement Amendments CLIA as qualified to perform high complexity clinical laboratory testing.    OXACILLIN Value in next row Sensitive      16 SENSITIVEThis is a modified FDA-approved test that has been validated and its performance characteristics determined by the reporting laboratory.  This laboratory is certified under the Clinical Laboratory Improvement Amendments CLIA as qualified to perform high complexity clinical laboratory testing.    TETRACYCLINE Value in next row Sensitive      16 SENSITIVEThis is a modified FDA-approved test that has been validated and its performance characteristics determined by the reporting laboratory.  This laboratory is certified under the Clinical Laboratory Improvement Amendments CLIA as qualified to perform high complexity clinical laboratory testing.    VANCOMYCIN  Value in next row Sensitive      16 SENSITIVEThis is a modified FDA-approved test that has been validated and its performance characteristics determined by the reporting laboratory.  This laboratory is  certified under the Clinical Laboratory Improvement Amendments CLIA as qualified to perform high complexity clinical laboratory testing.    TRIMETH/SULFA Value in next row Sensitive      16 SENSITIVEThis is a modified FDA-approved test that has been validated and its performance characteristics determined by the reporting laboratory.  This laboratory is certified under the Clinical Laboratory Improvement Amendments CLIA as qualified to perform high complexity clinical  laboratory testing.    CLINDAMYCIN Value in next row Sensitive      16 SENSITIVEThis is a modified FDA-approved test that has been validated and its performance characteristics determined by the reporting laboratory.  This laboratory is certified under the Clinical Laboratory Improvement Amendments CLIA as qualified to perform high complexity clinical laboratory testing.    RIFAMPIN Value in next row Sensitive      16 SENSITIVEThis is a modified FDA-approved test that has been validated and its performance characteristics determined by the reporting laboratory.  This laboratory is certified under the Clinical Laboratory Improvement Amendments CLIA as qualified to perform high complexity clinical laboratory testing.    Inducible Clindamycin Value in next row Sensitive      16 SENSITIVEThis is a modified FDA-approved test that has been validated and its performance characteristics determined by the reporting laboratory.  This laboratory is certified under the Clinical Laboratory Improvement Amendments CLIA as qualified to perform high complexity clinical laboratory testing.    LINEZOLID Value in next row Sensitive      16 SENSITIVEThis is a modified FDA-approved test that has been validated and its performance characteristics determined by the reporting laboratory.  This laboratory is certified under the Clinical Laboratory Improvement Amendments CLIA as qualified to perform high complexity clinical laboratory testing.    * RARE STAPHYLOCOCCUS  AUREUS         Radiology Studies: No results found.      Scheduled Meds:  sodium chloride    Intravenous Once   amiodarone   200 mg Oral BID   atorvastatin   40 mg Oral QHS   bisacodyl   5 mg Oral Daily   divalproex   125 mg Oral BID   famotidine   20 mg Oral QHS   Gerhardt's butt cream   Topical BID   insulin  aspart  0-9 Units Subcutaneous TID WC   levothyroxine   50 mcg Oral QODAY   levothyroxine   75 mcg Oral QODAY   lisinopril   20 mg Oral Daily   mirtazapine   7.5 mg Oral QHS   pantoprazole   40 mg Oral Daily   polyethylene glycol  17 g Oral Daily   Continuous Infusions:  ceFEPime  (MAXIPIME ) IV     magnesium  sulfate bolus IVPB 4 g (12/30/24 1350)     LOS: 6 days     Calvin KATHEE Robson, MD [facility]   If 7PM-7AM, please contact night-coverage  12/30/2024, 3:18 PM   "

## 2024-12-30 NOTE — Plan of Care (Signed)

## 2024-12-31 ENCOUNTER — Other Ambulatory Visit: Payer: Self-pay

## 2024-12-31 LAB — CBC WITH DIFFERENTIAL/PLATELET
Abs Immature Granulocytes: 0.03 10*3/uL (ref 0.00–0.07)
Basophils Absolute: 0 10*3/uL (ref 0.0–0.1)
Basophils Relative: 0 %
Eosinophils Absolute: 0.3 10*3/uL (ref 0.0–0.5)
Eosinophils Relative: 5 %
HCT: 27.6 % — ABNORMAL LOW (ref 36.0–46.0)
Hemoglobin: 9.2 g/dL — ABNORMAL LOW (ref 12.0–15.0)
Immature Granulocytes: 0 %
Lymphocytes Relative: 20 %
Lymphs Abs: 1.5 10*3/uL (ref 0.7–4.0)
MCH: 28.5 pg (ref 26.0–34.0)
MCHC: 33.3 g/dL (ref 30.0–36.0)
MCV: 85.4 fL (ref 80.0–100.0)
Monocytes Absolute: 1.1 10*3/uL — ABNORMAL HIGH (ref 0.1–1.0)
Monocytes Relative: 15 %
Neutro Abs: 4.4 10*3/uL (ref 1.7–7.7)
Neutrophils Relative %: 60 %
Platelets: 264 10*3/uL (ref 150–400)
RBC: 3.23 MIL/uL — ABNORMAL LOW (ref 3.87–5.11)
RDW: 16.3 % — ABNORMAL HIGH (ref 11.5–15.5)
WBC: 7.3 10*3/uL (ref 4.0–10.5)
nRBC: 0 % (ref 0.0–0.2)

## 2024-12-31 LAB — BASIC METABOLIC PANEL WITH GFR
Anion gap: 10 (ref 5–15)
BUN: 14 mg/dL (ref 8–23)
CO2: 23 mmol/L (ref 22–32)
Calcium: 8 mg/dL — ABNORMAL LOW (ref 8.9–10.3)
Chloride: 108 mmol/L (ref 98–111)
Creatinine, Ser: 0.97 mg/dL (ref 0.44–1.00)
GFR, Estimated: >60 mL/min
Glucose, Bld: 80 mg/dL (ref 70–99)
Potassium: 3.5 mmol/L (ref 3.5–5.1)
Sodium: 141 mmol/L (ref 135–145)

## 2024-12-31 LAB — GLUCOSE, CAPILLARY
Glucose-Capillary: 97 mg/dL (ref 70–99)
Glucose-Capillary: 76 mg/dL (ref 70–99)
Glucose-Capillary: 106 mg/dL — ABNORMAL HIGH (ref 70–99)

## 2024-12-31 MED ORDER — LEVOTHYROXINE SODIUM 50 MCG PO TABS
50.0000 ug | ORAL_TABLET | ORAL | 0 refills | Status: AC
  Filled 2024-12-31: qty 15, 30d supply, fill #0

## 2024-12-31 MED ORDER — CIPROFLOXACIN HCL 500 MG PO TABS
500.0000 mg | ORAL_TABLET | Freq: Two times a day (BID) | ORAL | 0 refills | Status: AC
  Filled 2024-12-31: qty 20, 10d supply, fill #0

## 2024-12-31 MED ORDER — OXYCODONE HCL 5 MG PO TABS
5.0000 mg | ORAL_TABLET | Freq: Three times a day (TID) | ORAL | 0 refills | Status: AC | PRN
  Filled 2024-12-31: qty 20, 7d supply, fill #0

## 2024-12-31 MED ORDER — LEVOTHYROXINE SODIUM 75 MCG PO TABS
75.0000 ug | ORAL_TABLET | ORAL | 0 refills | Status: AC
  Filled 2024-12-31: qty 15, 30d supply, fill #0

## 2024-12-31 MED ORDER — DOXYCYCLINE HYCLATE 100 MG PO TABS
100.0000 mg | ORAL_TABLET | Freq: Two times a day (BID) | ORAL | 0 refills | Status: AC
  Filled 2024-12-31: qty 20, 10d supply, fill #0

## 2024-12-31 NOTE — Plan of Care (Signed)
" °  Problem: Health Behavior/Discharge Planning: Goal: Ability to identify and utilize available resources and services will improve Outcome: Progressing   Problem: Health Behavior/Discharge Planning: Goal: Ability to manage health-related needs will improve Outcome: Progressing   Problem: Fluid Volume: Goal: Ability to maintain a balanced intake and output will improve Outcome: Progressing   Problem: Coping: Goal: Ability to adjust to condition or change in health will improve Outcome: Progressing   Problem: Skin Integrity: Goal: Risk for impaired skin integrity will decrease Outcome: Progressing   Problem: Nutritional: Goal: Progress toward achieving an optimal weight will improve Outcome: Progressing   "

## 2024-12-31 NOTE — Discharge Summary (Signed)
 Physician Discharge Summary  ILISA HAYWORTH FMW:[id number] DOB: [birth year] DOA: 12/22/2024  PCP: [facility]  Admit date: 12/22/2024 Discharge date: 12/31/2024  Admitted From: Home Disposition:  Home w home health  Recommendations for Outpatient Follow-up:  Follow up with PCP in 1-2 weeks Follow up podiatry 2 weeks  Home Health:Yes PT OT RN  Equipment/Devices:Wound vac   Discharge Condition:Stable CODE STATUS:DNR Diet recommendation: Reg  Brief/Interim Summary:  From HPI Jadelin A Mckeough is a 74 y.o. female with medical history significant of right MCA CVA status post stenting, PAF on Eliquis , HTN, CAD, HTN, hypothyroidism, presented with worsening of left heel wound and dehydration.   History was provided by daughter at bedside.  Patient has had intermittent left leg pain since sometime earlier last year, gradually her ambulation significantly affected and she has become wheelchair-bound for 6+ months.  2 months ago she developed left heel wound, began with a bloody blister, at some point it ruptured and patient started develop a ulcer with callus on top of it.  Daughter went to see patient on the weekend and noticed the patient developed severe pain and swelling of left heel which was new.  Patient was evaluated by vascular surgeon who ordered outpatient ABI study scheduled for next week.  In addition, daughter also noticed patient p.o. intake has significant decreased over the weekend and appeared to be very dehydrated today which prompted her to send patient to ED.   ED Course: Afebrile, nontachycardic blood pressure 128/51.  X-ray showed no signs of osteomyelitis of left foot, blood work showed WBC 11.0 hemoglobin 9.3 BUN 32 creatinine 1.5 compared to baseline less than 1.0, K5.3.   Patient was given IV bolus x 1 and started on vancomycin  and Zosyn  in the ED.     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 Fayette  Plan: No IV antibiotics indicated on discharge.  Per ID recommendations de-escalate to doxycycline  and ciprofloxacin .  Additional 10 days prescribed on DC.  Initially there was concern given elevated QTc however this was reassessed x 2 on 2/4 before and after magnesium  supplementation and QTc was within normal limits.  Acute blood loss anemia from the surgical site Noted to have had downtrending of hemoglobin up to 6.7 on 2/3.  Interestingly received 1 unit PRBC with increase in hemoglobin up to 11.2. Plan: CBC is stable at time of discharge.  No evidence of blood loss.  Okay to resume home Eliquis .   AKI-improved Hyperkalemia-improved Kidney function baseline at time of discharge   PAF Resume Eliquis  Currently rate controlled   CVA Continue aspirin  and statin therapy   Hypothyroidism - Continue Synthroid    IIDM Resume home regimen   Discharge Instructions  Discharge Instructions     Discharge wound care:   Complete by: As directed    Negative pressure wound  therapy  Every Tues & Fri 10:00    Comments: NPWT dressing to be changed beginning 2/3 Question Answer Comment Amount of suction? 125 mm/Hg   Suction Type? Continuous     12/25/24 1112     12/26/24 1800    Wound care  2 times daily       Comments: Paint B great toe eschar wounds with Betadine 2 times a day and allow to dry. May leave open to air or cover with dry gauze and tape  12/26/24 1320   12/26/24 1400    Wound care  Daily      Comments: Cleanse L buttock wound with NS, apply silver hydrofiber ([id number]/[facility] #[id number]) to wound bed daily and secure with   Increase activity slowly   Complete by: As directed       Allergies as of 12/31/2024       Reactions   Augmentin [amoxicillin-pot Clavulanate] Diarrhea   Lisinopril  Other (See Comments)   No reaction listed on MAR   Norvasc [amlodipine] Other (See Comments)   No reaction listed on MAR        Medication List     STOP taking these medications    cholecalciferol 25 MCG (1000 UNIT) tablet Commonly known as: VITAMIN D3   cyanocobalamin  500 MCG tablet Commonly known as: VITAMIN B12   ferrous sulfate  325 (65 FE) MG tablet   folic acid  1 MG tablet Commonly known as: FOLVITE        TAKE these medications    acetaminophen  325 MG tablet Commonly known as: TYLENOL  Take 2 tablets (650 mg total) by mouth every 6 (six) hours as needed for mild pain, fever or headache.   amiodarone  200 MG tablet Commonly known as: PACERONE  Take 1 tablet (200 mg total) by mouth 2 (two) times daily.   apixaban  5 MG Tabs tablet Commonly known as: ELIQUIS  Take 1 tablet (5 mg total) by mouth 2 (two) times daily.   atorvastatin  40 MG tablet Commonly known as: LIPITOR  Take 1 tablet (40 mg total) by mouth daily.   ciprofloxacin  500 MG tablet Commonly known as: Cipro  Take 1 tablet (500 mg total) by mouth 2 (two) times daily for 10 days.   divalproex  125 MG DR tablet Commonly known as: DEPAKOTE  Take 125 mg by mouth 2 (two) times daily.   doxycycline  100 MG tablet Commonly known as: VIBRA -TABS Take 1 tablet (100 mg total) by mouth 2 (two) times daily for 10 days.   famotidine  20 MG tablet Commonly known as: PEPCID  Take 20 mg by mouth at bedtime.   levothyroxine   50 MCG tablet Commonly known as: SYNTHROID  Take 1 tablet (50 mcg total) by mouth every other day.   levothyroxine  75 MCG tablet Commonly known as: SYNTHROID  Take 1 tablet (75 mcg total) by mouth every other day.   lisinopril  20 MG tablet Commonly known as: ZESTRIL  Take 1 tablet (20 mg total) by mouth daily.   melatonin 3 MG Tabs tablet Take 2 tablets (6 mg total) by mouth at bedtime as needed.   metFORMIN  1000 MG tablet Commonly known as: GLUCOPHAGE  Take 1 tablet (1,000 mg total) by mouth 2 (two) times daily with a meal.   mirtazapine  7.5 MG tablet Commonly known as: REMERON  Take 7.5 mg by mouth at bedtime.   omeprazole 40 MG capsule Commonly known as: PRILOSEC Take 40 mg by mouth daily.   ondansetron  4 MG tablet Commonly known as: ZOFRAN  Take 4 mg by mouth every 6 (six) hours as needed for  nausea or vomiting.   oxyCODONE  5 MG immediate release tablet Commonly known as: Roxicodone  Take 1 tablet (5 mg total) by mouth every 8 (eight) hours as needed for severe pain (pain score 7-10) or breakthrough pain.   polyethylene glycol 17 g packet Commonly known as: MIRALAX  / GLYCOLAX  Take 17 g by mouth daily as needed for mild constipation.               Durable Medical Equipment  (From admission, onward)           Start     Ordered   12/28/24 1638  For home use only DME Hospital bed  Once       Comments: Patient will need a Hoyer lift  Question Answer Comment  Length of Need 12 Months   The above medical condition requires: Patient requires the ability to reposition frequently   Bed type Semi-electric   Hoyer Lift Yes      12/28/24 1637   12/28/24 1444  For home use only DME Bedside commode  Once       Question:  Patient needs a bedside commode to treat with the following condition  Answer:  Ambulatory dysfunction   12/28/24 1444   12/28/24 1444  For home use only DME Other see comment  Once       Comments: Tub bench  Question:  Length of Need  Answer:  6  Months   12/28/24 1444              Discharge Care Instructions  (From admission, onward)           Start     Ordered   12/31/24 0000  Discharge wound care:       Comments: Negative pressure wound therapy  Every Tues & Fri 10:00    Comments: NPWT dressing to be changed beginning 2/3 Question Answer Comment Amount of suction? 125 mm/Hg   Suction Type? Continuous     12/25/24 1112     12/26/24 1800    Wound care  2 times daily      Comments: Paint B great toe eschar wounds with Betadine 2 times a day and allow to dry. May leave open to air or cover with dry gauze and tape  12/26/24 1320   12/26/24 1400    Wound care  Daily      Comments: Cleanse L buttock wound with NS, apply silver hydrofiber (TI#[id number]/Ojtdnw #[id number]) to wound bed daily and secure with   12/31/24 1103            Contact information for follow-up providers     Lennie Barter, DPM Follow up in 2 week(s).   Specialty: Podiatry Why: For wound re-check Contact information: [address] [place] [address] [phone]         [facility]. Schedule an appointment as soon as possible for a visit in 1 week(s).   Specialty: General Practice Contact information: [address] [place] [address] [phone]              Contact information for after-discharge care     Home Medical Care     [facility]) .   Service: Home Health Services Contact information: [address] [place]  [address] [phone]                    Allergies[1]  Consultations: Podiatry Infectious disease   Procedures/Studies:  US  ARTERIAL ABI (SCREENING LOWER EXTREMITY) Result Date: 12/26/2024 CLINICAL HISTORY: Peripheral artery disease. Hypertension, hyperlipidemia, diabetes, left lower extremity rest pain and claudication. EXAM: NONINVASIVE PHYSIOLOGIC VASCULAR STUDY OF BILATERAL LOWER  EXTREMITIES TECHNIQUE: Evaluation of both lower extremities were performed at rest, including calculation of ankle-brachial indices with single level pressure measurements and doppler recording. COMPARISON: 12/22/2024. FINDINGS: Right ABI: 0.84 (previously 0.9) Left ABI: 0.87 (previously 0.81) Right Lower Extremity: Multiphasic arterial waveforms are recorded at the ankle. Left Lower Extremity: Multiphasic arterial waveforms are recorded at the ankle. IMPRESSION: 1. 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 2. Slightly decreased right ABI and slightly increased left ABI since previous exam. Electronically signed by: Katheleen Faes MD 12/26/2024 08:17 PM EST RP Workstation: [id number]   PERIPHERAL VASCULAR CATHETERIZATION Result Date: 12/25/2024 See surgical note for result.  US  ARTERIAL ABI (SCREENING LOWER EXTREMITY) Result Date: 12/22/2024 CLINICAL DATA:  Foot ulcer EXAM: NONINVASIVE PHYSIOLOGIC VASCULAR STUDY OF BILATERAL LOWER EXTREMITIES TECHNIQUE: Evaluation of both lower extremities were performed at rest, including calculation of ankle-brachial indices with single level pressure measurements and doppler recording. COMPARISON:  None available. FINDINGS: Right ABI:  0.90 Left ABI:  0.81 Right Lower Extremity:  Normal arterial waveforms at the ankle. Left Lower Extremity:  Normal arterial waveforms at the ankle. 0.8-0.89 Mild PAD IMPRESSION: Decreased ankle-brachial indices consistent with mild BILATERAL lower extremity peripheral arterial disease. Electronically Signed   By: Aliene Lloyd M.D.   On: 12/22/2024 15:11   MR ANKLE LEFT WO CONTRAST Result Date: 12/22/2024 EXAM: MRI of the left Ankle without contrast. 12/22/2024 02:16:53 PM TECHNIQUE: Multiplanar multisequence MRI of the left ankle was performed without the administration of intravenous contrast. COMPARISON: None available. CLINICAL HISTORY: Heal wound, possible osteomyelitis. FINDINGS: SYNDESMOTIC LIGAMENTS: Intact  anterior inferior tibiofibular ligament and posterior inferior tibiofibular ligament. No significant periligamentous edema or interval widening. LATERAL COLLATERAL LIGAMENT COMPLEX: Intact anterior talofibular ligament, posterior talofibular ligament and calcaneofibular ligament. DELTOID LIGAMENT COMPLEX: Intact superficial and deep components. SINUS TARSI AND SPRING LIGAMENT: Visualized portions of the spring ligament are intact. Normal appearance of the sinus tarsi. MEDIAL TENDONS: Distal tibialis posterior tenosynovitis. Mild flexor hallucis longus tenosynovitis proximal to the knot of henry. Intact flexor digitorum longus tendon. LATERAL TENDONS: Longitudinal split tear of the peroneus brevis tendon adjacent to the lateral malleolus. Intact peroneus longus tendon. ANTERIOR TENDONS: The tibialis anterior, extensor hallucis longus and extensor digitorum longus tendons are normal in position, morphology and signal. ACHILLES TENDON: Mild distal achilles tendinopathy with faintly increased internal T2 signal and anterior convexity of the tendon. No associated bursitis. PLANTAR FASCIA: The medial and lateral bundles of the plantar fascia are normal in morphology and signal. There is no acute plantar fasciitis or tear. No plantar fascial nodules. TARSAL TUNNEL: There are no obstructing lesions within the tarsal tunnel. BONE MARROW: Trace endosteal edema along the adjacent calcaneus with demineralized cortex on image 24 series 4, suspicious for early calcaneal osteomyelitis. No acute fracture. No aggressive marrow replacing lesion. JOINT SPACES: No significant joint effusion. No significant degenerative changes. Normal alignment. SOFT TISSUES: Posterior heel wound behind the calcaneus. Subcutaneous edema circumferentially at the ankle (especially laterally) and tracking into the dorsum of the foot, cellulitis is not excluded. IMPRESSION: 1. Posterior heel wound with adjacent calcaneal cortical demineralization and trace  endosteal edema, suspicious for early calcaneal osteomyelitis. 2. Circumferential ankle and dorsal foot subcutaneous edema, cellulitis not excluded. 3. Longitudinal split tear of the peroneus brevis tendon adjacent to the lateral malleolus. 4. Distal tibialis  posterior tenosynovitis. 5. Mild flexor hallucis longus tenosynovitis proximal to the knot of Henry. 6. Mild distal Achilles tendinopathy. Electronically signed by: Ryan Salvage MD 12/22/2024 02:45 PM EST RP Workstation: [id number]   DG Foot Complete Left Result Date: 12/22/2024 CLINICAL DATA:  eval signs of osteo. ulcer on heel EXAM: LEFT FOOT - COMPLETE 3+ VIEW COMPARISON:  None Available. FINDINGS: There is no evidence of fracture or dislocation. Diffuse, patchy osseous demineralization. No focal demineralization, erosion or periosteal change. There is no evidence of arthropathy or other focal bone abnormality. Soft tissues are unremarkable. IMPRESSION: 1. No acute displaced fracture, dislocation or radiographic findings of osteomyelitis within the LEFT foot. 2. Diffuse osseous demineralization, correlate for osteopenia. Electronically Signed   By: Thom Hall M.D.   On: 12/22/2024 12:49   US  Venous Img Lower Unilateral Left Result Date: 12/22/2024 CLINICAL DATA:  Left lower extremity pain and swelling EXAM: LEFT LOWER EXTREMITY VENOUS DOPPLER ULTRASOUND TECHNIQUE: Gray-scale sonography with compression, as well as color and duplex ultrasound, were performed to evaluate the deep venous system(s) from the level of the common femoral vein through the popliteal and proximal calf veins. COMPARISON:  None Available. FINDINGS: VENOUS Normal compressibility of the common femoral, superficial femoral, and popliteal veins, as well as the visualized calf veins. Visualized portions of profunda femoral vein and great saphenous vein unremarkable. No filling defects to suggest DVT on grayscale or color Doppler imaging. Doppler waveforms show normal direction  of venous flow, normal respiratory plasticity and response to augmentation. Limited views of the contralateral common femoral vein are unremarkable. OTHER None. Limitations: none IMPRESSION: Negative. Electronically Signed   By: Wilkie Lent M.D.   On: 12/22/2024 12:20      Subjective: Seen and examined on the day of discharge.  Stable no distress.  Appropriate for discharge home.  Discharge Exam: Vitals:   12/31/24 0835 12/31/24 0837  BP: (!) 129/44 (!) 139/45  Pulse:  (!) 59  Resp: 14 16  Temp:    SpO2:  96%   Vitals:   12/31/24 0737 12/31/24 0751 12/31/24 0835 12/31/24 0837  BP:  (!) 170/93 (!) 129/44 (!) 139/45  Pulse: (!) 59 65  (!) 59  Resp:  15 14 16   Temp:  97.9 F (36.6 C)    TempSrc:  Oral    SpO2: 98% 98%  96%  Weight:      Height:        General: Pt is alert, awake, not in acute distress Cardiovascular: RRR, S1/S2 +, no rubs, no gallops Respiratory: CTA bilaterally, no wheezing, no rhonchi Abdominal: Soft, NT, ND, bowel sounds + Extremities: no edema, no cyanosis    The results of significant diagnostics from this hospitalization (including imaging, microbiology, ancillary and laboratory) are listed below for reference.     Microbiology: Recent Results (from the past 240 hours)  MRSA Next Gen by PCR, Nasal     Status: None   Collection Time: 12/22/24  2:02 PM   Specimen: Nasal Mucosa; Nasal Swab  Result Value Ref Range Status   MRSA by PCR Next Gen NOT DETECTED NOT DETECTED Final    Comment: (NOTE) The GeneXpert MRSA Assay (FDA approved for NASAL specimens only), is one component of a comprehensive MRSA colonization surveillance program. It is not intended to diagnose MRSA infection nor to guide or monitor treatment for MRSA infections. Test performance is not FDA approved in patients less than 68 years old. Performed at [facility], [address] [place], [place] [address]  CULTURE, BLOOD (ROUTINE X 2) w Reflex to ID Panel      Status: None   Collection Time: 12/22/24  6:27 PM   Specimen: BLOOD  Result Value Ref Range Status   Specimen Description BLOOD BLOOD LEFT ARM  Final   Special Requests   Final    BOTTLES DRAWN AEROBIC AND ANAEROBIC Blood Culture adequate volume   Culture   Final    NO GROWTH 5 DAYS Performed at [facility], [address] [place], [place] [address]    Report Status 12/27/2024 FINAL  Final  CULTURE, BLOOD (ROUTINE X 2) w Reflex to ID Panel     Status: None   Collection Time: 12/22/24  8:44 PM   Specimen: BLOOD  Result Value Ref Range Status   Specimen Description BLOOD BLOOD LEFT HAND  Final   Special Requests   Final    BOTTLES DRAWN AEROBIC AND ANAEROBIC Blood Culture adequate volume   Culture   Final    NO GROWTH 5 DAYS Performed at [facility], [address], [place], [place] [address]    Report Status 12/27/2024 FINAL  Final  Aerobic/Anaerobic Culture w Gram Stain (surgical/deep wound)     Status: None   Collection Time: 12/24/24  4:48 PM   Specimen: PATH Bone biopsy; Tissue  Result Value Ref Range Status   Specimen Description   Final    BONE Performed at [facility], [address] [place], [place] [address]    Special Requests   Final    NONE Performed at [facility], [address] [place], [place] [address]    Gram Stain   Final    RARE WBC PRESENT, PREDOMINANTLY PMN NO ORGANISMS SEEN    Culture   Final    RARE STAPHYLOCOCCUS AUREUS RARE PSEUDOMONAS AERUGINOSA CRITICAL RESULT CALLED TO, READ BACK BY AND VERIFIED WITH: RN KATRINA P. [id number] AT 1602, ADC NO ANAEROBES ISOLATED Performed at [facility], [address] [place], [place] [address]    Report Status 12/30/2024 FINAL  Final   Organism ID, Bacteria STAPHYLOCOCCUS AUREUS  Final   Organism ID, Bacteria PSEUDOMONAS AERUGINOSA  Final      Susceptibility   Pseudomonas aeruginosa - MIC*    MEROPENEM 0.5 SENSITIVE Sensitive     CIPROFLOXACIN  0.12  SENSITIVE Sensitive     IMIPENEM 2 SENSITIVE Sensitive     PIP/TAZO Value in next row Sensitive      16 SENSITIVEThis is a modified FDA-approved test that has been validated and its performance characteristics determined by the reporting laboratory.  This laboratory is certified under the Clinical Laboratory Improvement Amendments CLIA as qualified to perform high complexity clinical laboratory testing.    CEFEPIME  Value in next row Sensitive      16 SENSITIVEThis is a modified FDA-approved test that has been validated and its performance characteristics determined by the reporting laboratory.  This laboratory is certified under the Clinical Laboratory Improvement Amendments CLIA as qualified to perform high complexity clinical laboratory testing.    CEFTAZIDIME/AVIBACTAM Value in next row Sensitive      16 SENSITIVEThis is a modified FDA-approved test that has been validated and its performance characteristics determined by the reporting laboratory.  This laboratory is certified under the Clinical Laboratory Improvement Amendments CLIA as qualified to perform high complexity clinical laboratory testing.    CEFTOLOZANE/TAZOBACTAM Value in next row Sensitive      16 SENSITIVEThis is a modified FDA-approved test that has been validated  and its performance characteristics determined by the reporting laboratory.  This laboratory is certified under the Clinical Laboratory Improvement Amendments CLIA as qualified to perform high complexity clinical laboratory testing.    TOBRAMYCIN Value in next row Sensitive      16 SENSITIVEThis is a modified FDA-approved test that has been validated and its performance characteristics determined by the reporting laboratory.  This laboratory is certified under the Clinical Laboratory Improvement Amendments CLIA as qualified to perform high complexity clinical laboratory testing.    CEFTAZIDIME Value in next row Sensitive      16 SENSITIVEThis is a modified FDA-approved test  that has been validated and its performance characteristics determined by the reporting laboratory.  This laboratory is certified under the Clinical Laboratory Improvement Amendments CLIA as qualified to perform high complexity clinical laboratory testing.    * RARE PSEUDOMONAS AERUGINOSA   Staphylococcus aureus - MIC*    CIPROFLOXACIN  Value in next row Resistant      16 SENSITIVEThis is a modified FDA-approved test that has been validated and its performance characteristics determined by the reporting laboratory.  This laboratory is certified under the Clinical Laboratory Improvement Amendments CLIA as qualified to perform high complexity clinical laboratory testing.    ERYTHROMYCIN Value in next row Sensitive      16 SENSITIVEThis is a modified FDA-approved test that has been validated and its performance characteristics determined by the reporting laboratory.  This laboratory is certified under the Clinical Laboratory Improvement Amendments CLIA as qualified to perform high complexity clinical laboratory testing.    GENTAMICIN Value in next row Sensitive      16 SENSITIVEThis is a modified FDA-approved test that has been validated and its performance characteristics determined by the reporting laboratory.  This laboratory is certified under the Clinical Laboratory Improvement Amendments CLIA as qualified to perform high complexity clinical laboratory testing.    OXACILLIN Value in next row Sensitive      16 SENSITIVEThis is a modified FDA-approved test that has been validated and its performance characteristics determined by the reporting laboratory.  This laboratory is certified under the Clinical Laboratory Improvement Amendments CLIA as qualified to perform high complexity clinical laboratory testing.    TETRACYCLINE Value in next row Sensitive      16 SENSITIVEThis is a modified FDA-approved test that has been validated and its performance characteristics determined by the reporting laboratory.   This laboratory is certified under the Clinical Laboratory Improvement Amendments CLIA as qualified to perform high complexity clinical laboratory testing.    VANCOMYCIN  Value in next row Sensitive      16 SENSITIVEThis is a modified FDA-approved test that has been validated and its performance characteristics determined by the reporting laboratory.  This laboratory is certified under the Clinical Laboratory Improvement Amendments CLIA as qualified to perform high complexity clinical laboratory testing.    TRIMETH/SULFA Value in next row Sensitive      16 SENSITIVEThis is a modified FDA-approved test that has been validated and its performance characteristics determined by the reporting laboratory.  This laboratory is certified under the Clinical Laboratory Improvement Amendments CLIA as qualified to perform high complexity clinical laboratory testing.    CLINDAMYCIN Value in next row Sensitive      16 SENSITIVEThis is a modified FDA-approved test that has been validated and its performance characteristics determined by the reporting laboratory.  This laboratory is certified under the Clinical Laboratory Improvement Amendments CLIA as qualified to perform high complexity clinical laboratory testing.    RIFAMPIN Value  in next row Sensitive      16 SENSITIVEThis is a modified FDA-approved test that has been validated and its performance characteristics determined by the reporting laboratory.  This laboratory is certified under the Clinical Laboratory Improvement Amendments CLIA as qualified to perform high complexity clinical laboratory testing.    Inducible Clindamycin Value in next row Sensitive      16 SENSITIVEThis is a modified FDA-approved test that has been validated and its performance characteristics determined by the reporting laboratory.  This laboratory is certified under the Clinical Laboratory Improvement Amendments CLIA as qualified to perform high complexity clinical laboratory testing.     LINEZOLID Value in next row Sensitive      16 SENSITIVEThis is a modified FDA-approved test that has been validated and its performance characteristics determined by the reporting laboratory.  This laboratory is certified under the Clinical Laboratory Improvement Amendments CLIA as qualified to perform high complexity clinical laboratory testing.    * RARE STAPHYLOCOCCUS AUREUS     Labs: BNP (last 3 results) No results for input(s): BNP in the last 8760 hours. Basic Metabolic Panel: Recent Labs  Lab 12/25/24 0541 12/26/24 0802 12/29/24 0845 12/30/24 0501 12/31/24 0448  NA 137 143 141 142 141  K 4.8 5.2* 3.8 3.6 3.5  CL 105 109 107 105 108  CO2 19* 22 22 22 23   GLUCOSE 252* 90 98 82 80  BUN 19 16 12 11 14   CREATININE 1.17* 1.13* 1.09* 1.03* 0.97  CALCIUM  6.6* 7.1* 7.9* 8.4* 8.0*  MG  --   --   --  1.0*  --    Liver Function Tests: No results for input(s): AST, ALT, ALKPHOS, BILITOT, PROT, ALBUMIN in the last 168 hours. No results for input(s): LIPASE, AMYLASE in the last 168 hours. No results for input(s): AMMONIA in the last 168 hours. CBC: Recent Labs  Lab 12/25/24 0541 12/26/24 0802 12/29/24 0432 12/30/24 0501 12/31/24 0448  WBC 8.5 12.3* 9.0 12.2* 7.3  NEUTROABS 7.9* 9.2* 6.3 9.1* 4.4  HGB 7.2* 8.1* 6.7* 11.2* 9.2*  HCT 23.0* 25.9* 20.5* 34.0* 27.6*  MCV 88.1 86.9 85.4 83.3 85.4  PLT 211 253 273 276 264   Cardiac Enzymes: No results for input(s): CKTOTAL, CKMB, CKMBINDEX, TROPONINI in the last 168 hours. BNP: Invalid input(s): POCBNP CBG: Recent Labs  Lab 12/30/24 1953 12/30/24 2323 12/31/24 0308 12/31/24 0748 12/31/24 1104  GLUCAP 130* 109* 97 76 106*   D-Dimer No results for input(s): DDIMER in the last 72 hours. Hgb A1c No results for input(s): HGBA1C in the last 72 hours. Lipid Profile No results for input(s): CHOL, HDL, LDLCALC, TRIG, CHOLHDL, LDLDIRECT in the last 72 hours. Thyroid function  studies No results for input(s): TSH, T4TOTAL, T3FREE, THYROIDAB in the last 72 hours.  Invalid input(s): FREET3 Anemia work up No results for input(s): VITAMINB12, FOLATE, FERRITIN, TIBC, IRON, RETICCTPCT in the last 72 hours. Urinalysis    Component Value Date/Time   COLORURINE YELLOW 10/20/2024 2251   APPEARANCEUR HAZY (A) 10/20/2024 2251   LABSPEC 1.027 10/20/2024 2251   PHURINE 5.0 10/20/2024 2251   GLUCOSEU NEGATIVE 10/20/2024 2251   HGBUR SMALL (A) 10/20/2024 2251   BILIRUBINUR NEGATIVE 10/20/2024 2251   KETONESUR 5 (A) 10/20/2024 2251   PROTEINUR 30 (A) 10/20/2024 2251   NITRITE NEGATIVE 10/20/2024 2251   LEUKOCYTESUR MODERATE (A) 10/20/2024 2251   Sepsis Labs Recent Labs  Lab 12/26/24 0802 12/29/24 0432 12/30/24 0501 12/31/24 0448  WBC 12.3* 9.0 12.2* 7.3  Microbiology Recent Results (from the past 240 hours)  MRSA Next Gen by PCR, Nasal     Status: None   Collection Time: 12/22/24  2:02 PM   Specimen: Nasal Mucosa; Nasal Swab  Result Value Ref Range Status   MRSA by PCR Next Gen NOT DETECTED NOT DETECTED Final    Comment: (NOTE) The GeneXpert MRSA Assay (FDA approved for NASAL specimens only), is one component of a comprehensive MRSA colonization surveillance program. It is not intended to diagnose MRSA infection nor to guide or monitor treatment for MRSA infections. Test performance is not FDA approved in patients less than 70 years old. Performed at [facility], [address] [place], [place] [address]   CULTURE, BLOOD (ROUTINE X 2) w Reflex to ID Panel     Status: None   Collection Time: 12/22/24  6:27 PM   Specimen: BLOOD  Result Value Ref Range Status   Specimen Description BLOOD BLOOD LEFT ARM  Final   Special Requests   Final    BOTTLES DRAWN AEROBIC AND ANAEROBIC Blood Culture adequate volume   Culture   Final    NO GROWTH 5 DAYS Performed at [facility], [address] [place], [place]  [address]    Report Status 12/27/2024 FINAL  Final  CULTURE, BLOOD (ROUTINE X 2) w Reflex to ID Panel     Status: None   Collection Time: 12/22/24  8:44 PM   Specimen: BLOOD  Result Value Ref Range Status   Specimen Description BLOOD BLOOD LEFT HAND  Final   Special Requests   Final    BOTTLES DRAWN AEROBIC AND ANAEROBIC Blood Culture adequate volume   Culture   Final    NO GROWTH 5 DAYS Performed at [facility], [address] [place], [place] [address]    Report Status 12/27/2024 FINAL  Final  Aerobic/Anaerobic Culture w Gram Stain (surgical/deep wound)     Status: None   Collection Time: 12/24/24  4:48 PM   Specimen: PATH Bone biopsy; Tissue  Result Value Ref Range Status   Specimen Description   Final    BONE Performed at [facility], [address] [place], [place] [address]    Special Requests   Final    NONE Performed at [facility], [address] [place], [place] [address]    Gram Stain   Final    RARE WBC PRESENT, PREDOMINANTLY PMN NO ORGANISMS SEEN    Culture   Final    RARE STAPHYLOCOCCUS AUREUS RARE PSEUDOMONAS AERUGINOSA CRITICAL RESULT CALLED TO, READ BACK BY AND VERIFIED WITH: RN KATRINA P. [id number] AT 1602, ADC NO ANAEROBES ISOLATED Performed at [facility], [address], [place], [place] [address]    Report Status 12/30/2024 FINAL  Final   Organism ID, Bacteria STAPHYLOCOCCUS AUREUS  Final   Organism ID, Bacteria PSEUDOMONAS AERUGINOSA  Final      Susceptibility   Pseudomonas aeruginosa - MIC*    MEROPENEM 0.5 SENSITIVE Sensitive     CIPROFLOXACIN  0.12 SENSITIVE Sensitive     IMIPENEM 2 SENSITIVE Sensitive     PIP/TAZO Value in next row Sensitive      16 SENSITIVEThis is a modified FDA-approved test that has been validated and its performance characteristics determined by the reporting laboratory.  This laboratory is certified under the Clinical Laboratory Improvement Amendments CLIA as qualified to perform high  complexity clinical laboratory testing.    CEFEPIME  Value in next row Sensitive  16 SENSITIVEThis is a modified FDA-approved test that has been validated and its performance characteristics determined by the reporting laboratory.  This laboratory is certified under the Clinical Laboratory Improvement Amendments CLIA as qualified to perform high complexity clinical laboratory testing.    CEFTAZIDIME/AVIBACTAM Value in next row Sensitive      16 SENSITIVEThis is a modified FDA-approved test that has been validated and its performance characteristics determined by the reporting laboratory.  This laboratory is certified under the Clinical Laboratory Improvement Amendments CLIA as qualified to perform high complexity clinical laboratory testing.    CEFTOLOZANE/TAZOBACTAM Value in next row Sensitive      16 SENSITIVEThis is a modified FDA-approved test that has been validated and its performance characteristics determined by the reporting laboratory.  This laboratory is certified under the Clinical Laboratory Improvement Amendments CLIA as qualified to perform high complexity clinical laboratory testing.    TOBRAMYCIN Value in next row Sensitive      16 SENSITIVEThis is a modified FDA-approved test that has been validated and its performance characteristics determined by the reporting laboratory.  This laboratory is certified under the Clinical Laboratory Improvement Amendments CLIA as qualified to perform high complexity clinical laboratory testing.    CEFTAZIDIME Value in next row Sensitive      16 SENSITIVEThis is a modified FDA-approved test that has been validated and its performance characteristics determined by the reporting laboratory.  This laboratory is certified under the Clinical Laboratory Improvement Amendments CLIA as qualified to perform high complexity clinical laboratory testing.    * RARE PSEUDOMONAS AERUGINOSA   Staphylococcus aureus - MIC*    CIPROFLOXACIN  Value in next row Resistant       16 SENSITIVEThis is a modified FDA-approved test that has been validated and its performance characteristics determined by the reporting laboratory.  This laboratory is certified under the Clinical Laboratory Improvement Amendments CLIA as qualified to perform high complexity clinical laboratory testing.    ERYTHROMYCIN Value in next row Sensitive      16 SENSITIVEThis is a modified FDA-approved test that has been validated and its performance characteristics determined by the reporting laboratory.  This laboratory is certified under the Clinical Laboratory Improvement Amendments CLIA as qualified to perform high complexity clinical laboratory testing.    GENTAMICIN Value in next row Sensitive      16 SENSITIVEThis is a modified FDA-approved test that has been validated and its performance characteristics determined by the reporting laboratory.  This laboratory is certified under the Clinical Laboratory Improvement Amendments CLIA as qualified to perform high complexity clinical laboratory testing.    OXACILLIN Value in next row Sensitive      16 SENSITIVEThis is a modified FDA-approved test that has been validated and its performance characteristics determined by the reporting laboratory.  This laboratory is certified under the Clinical Laboratory Improvement Amendments CLIA as qualified to perform high complexity clinical laboratory testing.    TETRACYCLINE Value in next row Sensitive      16 SENSITIVEThis is a modified FDA-approved test that has been validated and its performance characteristics determined by the reporting laboratory.  This laboratory is certified under the Clinical Laboratory Improvement Amendments CLIA as qualified to perform high complexity clinical laboratory testing.    VANCOMYCIN  Value in next row Sensitive      16 SENSITIVEThis is a modified FDA-approved test that has been validated and its performance characteristics determined by the reporting laboratory.  This laboratory  is certified under the Clinical Laboratory Improvement Amendments CLIA as qualified to  perform high complexity clinical laboratory testing.    TRIMETH/SULFA Value in next row Sensitive      16 SENSITIVEThis is a modified FDA-approved test that has been validated and its performance characteristics determined by the reporting laboratory.  This laboratory is certified under the Clinical Laboratory Improvement Amendments CLIA as qualified to perform high complexity clinical laboratory testing.    CLINDAMYCIN Value in next row Sensitive      16 SENSITIVEThis is a modified FDA-approved test that has been validated and its performance characteristics determined by the reporting laboratory.  This laboratory is certified under the Clinical Laboratory Improvement Amendments CLIA as qualified to perform high complexity clinical laboratory testing.    RIFAMPIN Value in next row Sensitive      16 SENSITIVEThis is a modified FDA-approved test that has been validated and its performance characteristics determined by the reporting laboratory.  This laboratory is certified under the Clinical Laboratory Improvement Amendments CLIA as qualified to perform high complexity clinical laboratory testing.    Inducible Clindamycin Value in next row Sensitive      16 SENSITIVEThis is a modified FDA-approved test that has been validated and its performance characteristics determined by the reporting laboratory.  This laboratory is certified under the Clinical Laboratory Improvement Amendments CLIA as qualified to perform high complexity clinical laboratory testing.    LINEZOLID Value in next row Sensitive      16 SENSITIVEThis is a modified FDA-approved test that has been validated and its performance characteristics determined by the reporting laboratory.  This laboratory is certified under the Clinical Laboratory Improvement Amendments CLIA as qualified to perform high complexity clinical laboratory testing.    * RARE  STAPHYLOCOCCUS AUREUS     Time coordinating discharge: 40 minutes  SIGNED:   Calvin KATHEE Robson, MD  [facility] 12/31/2024, 12:56 PM Pager   If 7PM-7AM, please contact night-coverage     [1]  Allergies Allergen Reactions   Augmentin [Amoxicillin-Pot Clavulanate] Diarrhea   Lisinopril  Other (See Comments)    No reaction listed on MAR   Norvasc [Amlodipine] Other (See Comments)    No reaction listed on [facility]

## 2024-12-31 NOTE — Consult Note (Signed)
" °  CLINICAL SUPPORT TEAM - WOUND OSTOMY AND CONTINENCE TEAM  CONSULTATION SERVICES   WOC Nurse-Inpatient Note   WOC Nurse wound follow up Wound type: Surgical pos debridement, graft, wound on L heel. Measurement: 5 cm x 3.5 cm x 0.1 cm. Wound bed: 50% red, 50% pale slightly yellow. Drainage (amount, consistency, odor) Small amount, serosanguinous. 50 ml in canister at 1100. Periwound: intact, partial macerated. Dressing procedure/placement/frequency: Removed old NPWT dressing Cleansed wound with normal saline Periwound skin protected with skin barrier wipe and window framed with drape Skin graft with Xeroform on top. Reapplied Xeroform, filled wound with 1 piece of black foam  Sealed NPWT dressing at HG Patient tolerated procedure well    Patient is ready for discharge, I spoke with nurse in charge to disconnect the hospital machine and plug on the home machine when arrives.  WOC team will not plan to follow further. Please reconsult if further assistance is needed. Thank-you,  Lela Holm MSN, RN, CWCN, CNS.  (Phone [phone])  "

## 2024-12-31 NOTE — TOC Transition Note (Signed)
 Transition of Care [facility]) - Discharge Note   Patient Details  Name: Madison Davidson MRN: [id number] Date of Birth: 12-04-1950  Transition of Care [facility]) CM/SW Contact:  Nathanael CHRISTELLA Ring, RN Phone Number: 12/31/2024, 10:50 AM   Clinical Narrative:    Wound vac to be delivered to the hospital before lunch time today by [employer].  Wound nurse at the hospital will change the wound dressing before the patient discharges.  [facility] will start dressing changes on Monday next week and continue dressing changes on MWF.     Final next level of care: Home w Home Health Services Barriers to Discharge: Barriers Resolved   Patient Goals and CMS Choice   CMS Medicare.gov Compare Post Acute Care list provided to:: Patient Represenative (must comment) Choice offered to / list presented to : Adult Children      Discharge Placement                       Discharge Plan and Services Additional resources added to the After Visit Summary for                  DME Arranged: Hospital bed, Other see comment, Negative pressure wound device (hoyer lift) DME Agency: [facility] Date DME Agency Contacted: 12/31/24 Time DME Agency Contacted: 1050   HH Arranged: RN, PT, OT HH Agency: [facility] Date HH Agency Contacted: 12/31/24 Time HH Agency Contacted: 1050 Representative spoke with at [facility] Agency: [place]   Social Drivers of Health (SDOH) Interventions SDOH Screenings   Food Insecurity: No Food Insecurity (12/23/2024)  Housing: Low Risk (12/23/2024)  Transportation Needs: No Transportation Needs (12/23/2024)  Utilities: Not At Risk (12/23/2024)  Social Connections: Unknown (12/23/2024)  Tobacco Use: Low Risk (12/24/2024)     Readmission Risk Interventions    10/21/2024   10:37 AM  Readmission Risk Prevention Plan  Transportation Screening Complete  PCP or Specialist Appt within 3-5 Days Complete  HRI or Home Care Consult Complete  Social Work Consult for Recovery Care  Planning/Counseling Complete  Palliative Care Screening Not Applicable  Medication Review Oceanographer) Complete

## 2024-12-31 NOTE — Progress Notes (Signed)
 Patient switched from Hospital wound vac to home wound vac machine, WOC nurse changed dressing.

## 2024-12-31 NOTE — TOC Progression Note (Signed)
 Transition of Care [facility]) - Progression Note    Patient Details  Name: Madison Davidson MRN: [id number] Date of Birth: 25-Mar-1951  Transition of Care [facility]) CM/SW Contact  Nathanael CHRISTELLA Ring, RN Phone Number: 12/31/2024, 9:17 AM  Clinical Narrative:     CM reached out to [facility] with [employer], he is working on getting the wound vac.  He sent Dr. Lennie the order yesterday evening to sign.  Dr. Lennie says that he electronically signed an order yesterday.                      Expected Discharge Plan and Services                                               Social Drivers of Health (SDOH) Interventions SDOH Screenings   Food Insecurity: No Food Insecurity (12/23/2024)  Housing: Low Risk (12/23/2024)  Transportation Needs: No Transportation Needs (12/23/2024)  Utilities: Not At Risk (12/23/2024)  Social Connections: Unknown (12/23/2024)  Tobacco Use: Low Risk (12/24/2024)    Readmission Risk Interventions    10/21/2024   10:37 AM  Readmission Risk Prevention Plan  Transportation Screening Complete  PCP or Specialist Appt within 3-5 Days Complete  HRI or Home Care Consult Complete  Social Work Consult for Recovery Care Planning/Counseling Complete  Palliative Care Screening Not Applicable  Medication Review Oceanographer) Complete

## 2024-12-31 NOTE — Progress Notes (Signed)
 Patient discharging home with daughter. All belongings provided to daughter and discharge instructions provided. IV removed, all questions answered.

## 2024-12-31 NOTE — Progress Notes (Signed)
 Pharmacy - Brief Note - Education  Patient discharged on doxycyline and ciprofloxacin .  When I went to educate patient she asked that I speak to daughter.  I was not able to get back to room before patient discharged.  I called and spoke to [name] to discuss how to take both antibiotics, drug interactions, and side effects to monitor for.    Medications were at bedside prior to discharge.     Aysiah Jurado, PharmD, BCPS, BCIDP Work Cell: [phone] 12/31/2024 3:02 PM

## 2025-01-05 ENCOUNTER — Encounter: Admitting: Vascular Surgery

## 2025-03-10 ENCOUNTER — Ambulatory Visit: Admitting: Internal Medicine
